# Patient Record
Sex: Male | Born: 1935 | Race: White | Hispanic: No | Smoking: Former smoker
Health system: Southern US, Community
[De-identification: ages and names within clinical notes are randomized; demographics above are authoritative.]

## PROBLEM LIST (undated history)

## (undated) DIAGNOSIS — D6481 Anemia due to antineoplastic chemotherapy: Secondary | ICD-10-CM

## (undated) DIAGNOSIS — N289 Disorder of kidney and ureter, unspecified: Secondary | ICD-10-CM

## (undated) DIAGNOSIS — E782 Mixed hyperlipidemia: Secondary | ICD-10-CM

## (undated) DIAGNOSIS — T451X5A Adverse effect of antineoplastic and immunosuppressive drugs, initial encounter: Principal | ICD-10-CM

## (undated) DIAGNOSIS — C9310 Chronic myelomonocytic leukemia not having achieved remission: Secondary | ICD-10-CM

## (undated) DIAGNOSIS — M1990 Unspecified osteoarthritis, unspecified site: Secondary | ICD-10-CM

## (undated) DIAGNOSIS — C801 Malignant (primary) neoplasm, unspecified: Secondary | ICD-10-CM

## (undated) DIAGNOSIS — G709 Myoneural disorder, unspecified: Secondary | ICD-10-CM

## (undated) DIAGNOSIS — F32A Depression, unspecified: Secondary | ICD-10-CM

## (undated) DIAGNOSIS — H269 Unspecified cataract: Secondary | ICD-10-CM

## (undated) DIAGNOSIS — K219 Gastro-esophageal reflux disease without esophagitis: Secondary | ICD-10-CM

## (undated) DIAGNOSIS — F419 Anxiety disorder, unspecified: Secondary | ICD-10-CM

## (undated) DIAGNOSIS — D649 Anemia, unspecified: Secondary | ICD-10-CM

## (undated) DIAGNOSIS — N183 Chronic kidney disease, stage 3 (moderate): Secondary | ICD-10-CM

## (undated) DIAGNOSIS — I10 Essential (primary) hypertension: Secondary | ICD-10-CM

## (undated) DIAGNOSIS — F329 Major depressive disorder, single episode, unspecified: Secondary | ICD-10-CM

## (undated) DIAGNOSIS — C8330 Diffuse large B-cell lymphoma, unspecified site: Secondary | ICD-10-CM

## (undated) DIAGNOSIS — H9319 Tinnitus, unspecified ear: Secondary | ICD-10-CM

## (undated) HISTORY — PX: CYSTOSCOPY: SUR368

## (undated) HISTORY — DX: Adverse effect of antineoplastic and immunosuppressive drugs, initial encounter: T45.1X5A

## (undated) HISTORY — DX: Major depressive disorder, single episode, unspecified: F32.9

## (undated) HISTORY — DX: Tinnitus, unspecified ear: H93.19

## (undated) HISTORY — DX: Anemia due to antineoplastic chemotherapy: D64.81

## (undated) HISTORY — DX: Anemia, unspecified: D64.9

## (undated) HISTORY — DX: Gastro-esophageal reflux disease without esophagitis: K21.9

## (undated) HISTORY — DX: Unspecified cataract: H26.9

## (undated) HISTORY — DX: Unspecified osteoarthritis, unspecified site: M19.90

## (undated) HISTORY — DX: Chronic myelomonocytic leukemia not having achieved remission: C93.10

## (undated) HISTORY — DX: Myoneural disorder, unspecified: G70.9

## (undated) HISTORY — DX: Depression, unspecified: F32.A

## (undated) HISTORY — DX: Mixed hyperlipidemia: E78.2

## (undated) HISTORY — DX: Malignant (primary) neoplasm, unspecified: C80.1

## (undated) HISTORY — DX: Diffuse large B-cell lymphoma, unspecified site: C83.30

## (undated) HISTORY — DX: Essential (primary) hypertension: I10

## (undated) HISTORY — DX: Anxiety disorder, unspecified: F41.9

## (undated) HISTORY — DX: Chronic kidney disease, stage 3 (moderate): N18.3

## (undated) HISTORY — PX: CATARACT EXTRACTION, BILATERAL: SHX1313

---

## 2001-06-25 HISTORY — PX: TOTAL HIP ARTHROPLASTY: SHX124

## 2004-06-25 HISTORY — PX: OTHER SURGICAL HISTORY: SHX169

## 2009-09-23 HISTORY — PX: TOTAL HIP ARTHROPLASTY: SHX124

## 2009-10-23 HISTORY — PX: COLONOSCOPY: SHX174

## 2009-10-23 LAB — HM COLONOSCOPY: HM Colonoscopy: NORMAL

## 2012-04-25 HISTORY — PX: TRIGGER FINGER RELEASE: SHX641

## 2013-07-03 DIAGNOSIS — C8589 Other specified types of non-Hodgkin lymphoma, extranodal and solid organ sites: Secondary | ICD-10-CM | POA: Diagnosis not present

## 2013-07-03 DIAGNOSIS — R5383 Other fatigue: Secondary | ICD-10-CM | POA: Diagnosis not present

## 2013-07-03 DIAGNOSIS — N183 Chronic kidney disease, stage 3 unspecified: Secondary | ICD-10-CM | POA: Diagnosis not present

## 2013-07-09 DIAGNOSIS — R5381 Other malaise: Secondary | ICD-10-CM | POA: Diagnosis not present

## 2013-07-09 DIAGNOSIS — R5383 Other fatigue: Secondary | ICD-10-CM | POA: Diagnosis not present

## 2013-07-14 DIAGNOSIS — N183 Chronic kidney disease, stage 3 unspecified: Secondary | ICD-10-CM | POA: Diagnosis not present

## 2013-07-14 DIAGNOSIS — I129 Hypertensive chronic kidney disease with stage 1 through stage 4 chronic kidney disease, or unspecified chronic kidney disease: Secondary | ICD-10-CM | POA: Diagnosis not present

## 2013-08-05 DIAGNOSIS — G4700 Insomnia, unspecified: Secondary | ICD-10-CM | POA: Diagnosis not present

## 2013-08-13 DIAGNOSIS — M542 Cervicalgia: Secondary | ICD-10-CM | POA: Diagnosis not present

## 2013-08-13 DIAGNOSIS — M545 Low back pain, unspecified: Secondary | ICD-10-CM | POA: Diagnosis not present

## 2013-08-13 DIAGNOSIS — IMO0002 Reserved for concepts with insufficient information to code with codable children: Secondary | ICD-10-CM | POA: Diagnosis not present

## 2013-08-13 DIAGNOSIS — Z6832 Body mass index (BMI) 32.0-32.9, adult: Secondary | ICD-10-CM | POA: Diagnosis not present

## 2013-08-13 DIAGNOSIS — M25559 Pain in unspecified hip: Secondary | ICD-10-CM | POA: Diagnosis not present

## 2013-08-18 DIAGNOSIS — M5412 Radiculopathy, cervical region: Secondary | ICD-10-CM | POA: Diagnosis not present

## 2013-08-18 DIAGNOSIS — M542 Cervicalgia: Secondary | ICD-10-CM | POA: Diagnosis not present

## 2013-08-21 DIAGNOSIS — M542 Cervicalgia: Secondary | ICD-10-CM | POA: Diagnosis not present

## 2013-08-21 DIAGNOSIS — M5412 Radiculopathy, cervical region: Secondary | ICD-10-CM | POA: Diagnosis not present

## 2013-08-24 DIAGNOSIS — M542 Cervicalgia: Secondary | ICD-10-CM | POA: Diagnosis not present

## 2013-08-24 DIAGNOSIS — M5412 Radiculopathy, cervical region: Secondary | ICD-10-CM | POA: Diagnosis not present

## 2013-08-28 DIAGNOSIS — M5412 Radiculopathy, cervical region: Secondary | ICD-10-CM | POA: Diagnosis not present

## 2013-08-28 DIAGNOSIS — M542 Cervicalgia: Secondary | ICD-10-CM | POA: Diagnosis not present

## 2013-09-01 DIAGNOSIS — M5412 Radiculopathy, cervical region: Secondary | ICD-10-CM | POA: Diagnosis not present

## 2013-09-01 DIAGNOSIS — M542 Cervicalgia: Secondary | ICD-10-CM | POA: Diagnosis not present

## 2013-09-08 DIAGNOSIS — M5412 Radiculopathy, cervical region: Secondary | ICD-10-CM | POA: Diagnosis not present

## 2013-09-08 DIAGNOSIS — M542 Cervicalgia: Secondary | ICD-10-CM | POA: Diagnosis not present

## 2013-09-10 DIAGNOSIS — M5412 Radiculopathy, cervical region: Secondary | ICD-10-CM | POA: Diagnosis not present

## 2013-09-10 DIAGNOSIS — M545 Low back pain, unspecified: Secondary | ICD-10-CM | POA: Diagnosis not present

## 2013-09-10 DIAGNOSIS — Z6830 Body mass index (BMI) 30.0-30.9, adult: Secondary | ICD-10-CM | POA: Diagnosis not present

## 2013-09-10 DIAGNOSIS — M542 Cervicalgia: Secondary | ICD-10-CM | POA: Diagnosis not present

## 2013-09-10 DIAGNOSIS — IMO0002 Reserved for concepts with insufficient information to code with codable children: Secondary | ICD-10-CM | POA: Diagnosis not present

## 2013-09-11 DIAGNOSIS — G609 Hereditary and idiopathic neuropathy, unspecified: Secondary | ICD-10-CM | POA: Diagnosis not present

## 2013-09-11 DIAGNOSIS — R209 Unspecified disturbances of skin sensation: Secondary | ICD-10-CM | POA: Diagnosis not present

## 2013-09-11 DIAGNOSIS — G479 Sleep disorder, unspecified: Secondary | ICD-10-CM | POA: Diagnosis not present

## 2013-09-15 DIAGNOSIS — M5412 Radiculopathy, cervical region: Secondary | ICD-10-CM | POA: Diagnosis not present

## 2013-09-15 DIAGNOSIS — M542 Cervicalgia: Secondary | ICD-10-CM | POA: Diagnosis not present

## 2013-09-18 DIAGNOSIS — M5412 Radiculopathy, cervical region: Secondary | ICD-10-CM | POA: Diagnosis not present

## 2013-09-18 DIAGNOSIS — M542 Cervicalgia: Secondary | ICD-10-CM | POA: Diagnosis not present

## 2013-09-22 DIAGNOSIS — M542 Cervicalgia: Secondary | ICD-10-CM | POA: Diagnosis not present

## 2013-09-22 DIAGNOSIS — M5412 Radiculopathy, cervical region: Secondary | ICD-10-CM | POA: Diagnosis not present

## 2013-09-25 DIAGNOSIS — M5412 Radiculopathy, cervical region: Secondary | ICD-10-CM | POA: Diagnosis not present

## 2013-09-25 DIAGNOSIS — M542 Cervicalgia: Secondary | ICD-10-CM | POA: Diagnosis not present

## 2013-09-29 DIAGNOSIS — M5412 Radiculopathy, cervical region: Secondary | ICD-10-CM | POA: Diagnosis not present

## 2013-09-29 DIAGNOSIS — M542 Cervicalgia: Secondary | ICD-10-CM | POA: Diagnosis not present

## 2013-10-02 DIAGNOSIS — M5412 Radiculopathy, cervical region: Secondary | ICD-10-CM | POA: Diagnosis not present

## 2013-10-02 DIAGNOSIS — M542 Cervicalgia: Secondary | ICD-10-CM | POA: Diagnosis not present

## 2013-10-07 DIAGNOSIS — E785 Hyperlipidemia, unspecified: Secondary | ICD-10-CM | POA: Diagnosis not present

## 2013-10-07 DIAGNOSIS — R319 Hematuria, unspecified: Secondary | ICD-10-CM | POA: Diagnosis not present

## 2013-10-07 DIAGNOSIS — N189 Chronic kidney disease, unspecified: Secondary | ICD-10-CM | POA: Diagnosis not present

## 2013-10-07 DIAGNOSIS — I129 Hypertensive chronic kidney disease with stage 1 through stage 4 chronic kidney disease, or unspecified chronic kidney disease: Secondary | ICD-10-CM | POA: Diagnosis not present

## 2013-10-08 DIAGNOSIS — I129 Hypertensive chronic kidney disease with stage 1 through stage 4 chronic kidney disease, or unspecified chronic kidney disease: Secondary | ICD-10-CM | POA: Diagnosis not present

## 2013-10-08 DIAGNOSIS — N183 Chronic kidney disease, stage 3 unspecified: Secondary | ICD-10-CM | POA: Diagnosis not present

## 2013-11-25 DIAGNOSIS — C8589 Other specified types of non-Hodgkin lymphoma, extranodal and solid organ sites: Secondary | ICD-10-CM | POA: Diagnosis not present

## 2013-12-01 DIAGNOSIS — C8589 Other specified types of non-Hodgkin lymphoma, extranodal and solid organ sites: Secondary | ICD-10-CM | POA: Diagnosis not present

## 2013-12-07 DIAGNOSIS — Z8572 Personal history of non-Hodgkin lymphomas: Secondary | ICD-10-CM | POA: Diagnosis not present

## 2013-12-07 DIAGNOSIS — E669 Obesity, unspecified: Secondary | ICD-10-CM | POA: Diagnosis not present

## 2013-12-07 DIAGNOSIS — Z87891 Personal history of nicotine dependence: Secondary | ICD-10-CM | POA: Diagnosis not present

## 2013-12-07 DIAGNOSIS — M79609 Pain in unspecified limb: Secondary | ICD-10-CM | POA: Diagnosis not present

## 2013-12-07 DIAGNOSIS — E559 Vitamin D deficiency, unspecified: Secondary | ICD-10-CM | POA: Diagnosis not present

## 2013-12-07 DIAGNOSIS — E785 Hyperlipidemia, unspecified: Secondary | ICD-10-CM | POA: Diagnosis not present

## 2013-12-07 DIAGNOSIS — G609 Hereditary and idiopathic neuropathy, unspecified: Secondary | ICD-10-CM | POA: Diagnosis not present

## 2013-12-07 DIAGNOSIS — N400 Enlarged prostate without lower urinary tract symptoms: Secondary | ICD-10-CM | POA: Diagnosis not present

## 2013-12-07 DIAGNOSIS — M62838 Other muscle spasm: Secondary | ICD-10-CM | POA: Diagnosis not present

## 2013-12-07 DIAGNOSIS — G2581 Restless legs syndrome: Secondary | ICD-10-CM | POA: Diagnosis not present

## 2013-12-07 DIAGNOSIS — E119 Type 2 diabetes mellitus without complications: Secondary | ICD-10-CM | POA: Diagnosis not present

## 2013-12-07 DIAGNOSIS — M5030 Other cervical disc degeneration, unspecified cervical region: Secondary | ICD-10-CM | POA: Diagnosis not present

## 2013-12-07 DIAGNOSIS — R079 Chest pain, unspecified: Secondary | ICD-10-CM | POA: Diagnosis not present

## 2013-12-07 DIAGNOSIS — G4700 Insomnia, unspecified: Secondary | ICD-10-CM | POA: Diagnosis not present

## 2013-12-07 DIAGNOSIS — I129 Hypertensive chronic kidney disease with stage 1 through stage 4 chronic kidney disease, or unspecified chronic kidney disease: Secondary | ICD-10-CM | POA: Diagnosis not present

## 2013-12-07 DIAGNOSIS — F411 Generalized anxiety disorder: Secondary | ICD-10-CM | POA: Diagnosis not present

## 2013-12-07 DIAGNOSIS — N183 Chronic kidney disease, stage 3 unspecified: Secondary | ICD-10-CM | POA: Diagnosis not present

## 2013-12-08 DIAGNOSIS — G589 Mononeuropathy, unspecified: Secondary | ICD-10-CM | POA: Diagnosis not present

## 2013-12-08 DIAGNOSIS — I10 Essential (primary) hypertension: Secondary | ICD-10-CM | POA: Diagnosis not present

## 2013-12-08 DIAGNOSIS — R799 Abnormal finding of blood chemistry, unspecified: Secondary | ICD-10-CM | POA: Diagnosis not present

## 2013-12-08 DIAGNOSIS — N189 Chronic kidney disease, unspecified: Secondary | ICD-10-CM | POA: Diagnosis not present

## 2013-12-08 DIAGNOSIS — M62838 Other muscle spasm: Secondary | ICD-10-CM | POA: Diagnosis not present

## 2013-12-11 DIAGNOSIS — L600 Ingrowing nail: Secondary | ICD-10-CM | POA: Diagnosis not present

## 2013-12-16 DIAGNOSIS — M542 Cervicalgia: Secondary | ICD-10-CM | POA: Diagnosis not present

## 2013-12-16 DIAGNOSIS — M545 Low back pain, unspecified: Secondary | ICD-10-CM | POA: Diagnosis not present

## 2013-12-16 DIAGNOSIS — Z6831 Body mass index (BMI) 31.0-31.9, adult: Secondary | ICD-10-CM | POA: Diagnosis not present

## 2013-12-16 DIAGNOSIS — IMO0001 Reserved for inherently not codable concepts without codable children: Secondary | ICD-10-CM | POA: Diagnosis not present

## 2014-01-05 DIAGNOSIS — N400 Enlarged prostate without lower urinary tract symptoms: Secondary | ICD-10-CM | POA: Diagnosis not present

## 2014-01-05 DIAGNOSIS — R42 Dizziness and giddiness: Secondary | ICD-10-CM | POA: Diagnosis not present

## 2014-01-05 DIAGNOSIS — Z23 Encounter for immunization: Secondary | ICD-10-CM | POA: Diagnosis not present

## 2014-01-05 DIAGNOSIS — G609 Hereditary and idiopathic neuropathy, unspecified: Secondary | ICD-10-CM | POA: Diagnosis not present

## 2014-01-05 DIAGNOSIS — Z0000 Encounter for general adult medical examination without abnormal findings: Secondary | ICD-10-CM | POA: Diagnosis not present

## 2014-01-05 DIAGNOSIS — K219 Gastro-esophageal reflux disease without esophagitis: Secondary | ICD-10-CM | POA: Diagnosis not present

## 2014-01-05 DIAGNOSIS — R599 Enlarged lymph nodes, unspecified: Secondary | ICD-10-CM | POA: Diagnosis not present

## 2014-01-05 DIAGNOSIS — G4700 Insomnia, unspecified: Secondary | ICD-10-CM | POA: Diagnosis not present

## 2014-01-05 DIAGNOSIS — H902 Conductive hearing loss, unspecified: Secondary | ICD-10-CM | POA: Diagnosis not present

## 2014-01-12 DIAGNOSIS — E785 Hyperlipidemia, unspecified: Secondary | ICD-10-CM | POA: Diagnosis not present

## 2014-01-13 DIAGNOSIS — G589 Mononeuropathy, unspecified: Secondary | ICD-10-CM | POA: Diagnosis not present

## 2014-01-13 DIAGNOSIS — Z79899 Other long term (current) drug therapy: Secondary | ICD-10-CM | POA: Diagnosis not present

## 2014-01-13 DIAGNOSIS — G479 Sleep disorder, unspecified: Secondary | ICD-10-CM | POA: Diagnosis not present

## 2014-01-13 DIAGNOSIS — G478 Other sleep disorders: Secondary | ICD-10-CM | POA: Diagnosis not present

## 2014-01-13 DIAGNOSIS — R42 Dizziness and giddiness: Secondary | ICD-10-CM | POA: Diagnosis not present

## 2014-01-20 DIAGNOSIS — K3189 Other diseases of stomach and duodenum: Secondary | ICD-10-CM | POA: Diagnosis not present

## 2014-01-20 DIAGNOSIS — K2270 Barrett's esophagus without dysplasia: Secondary | ICD-10-CM | POA: Diagnosis not present

## 2014-01-20 DIAGNOSIS — Z1211 Encounter for screening for malignant neoplasm of colon: Secondary | ICD-10-CM | POA: Diagnosis not present

## 2014-01-20 DIAGNOSIS — Z86010 Personal history of colonic polyps: Secondary | ICD-10-CM | POA: Diagnosis not present

## 2014-01-20 DIAGNOSIS — K219 Gastro-esophageal reflux disease without esophagitis: Secondary | ICD-10-CM | POA: Diagnosis not present

## 2014-01-20 DIAGNOSIS — R1013 Epigastric pain: Secondary | ICD-10-CM | POA: Diagnosis not present

## 2014-01-20 DIAGNOSIS — K228 Other specified diseases of esophagus: Secondary | ICD-10-CM | POA: Diagnosis not present

## 2014-01-20 DIAGNOSIS — R1310 Dysphagia, unspecified: Secondary | ICD-10-CM | POA: Diagnosis not present

## 2014-01-20 DIAGNOSIS — K5730 Diverticulosis of large intestine without perforation or abscess without bleeding: Secondary | ICD-10-CM | POA: Diagnosis not present

## 2014-01-20 DIAGNOSIS — D126 Benign neoplasm of colon, unspecified: Secondary | ICD-10-CM | POA: Diagnosis not present

## 2014-01-20 DIAGNOSIS — K2289 Other specified disease of esophagus: Secondary | ICD-10-CM | POA: Diagnosis not present

## 2014-01-28 DIAGNOSIS — Z6831 Body mass index (BMI) 31.0-31.9, adult: Secondary | ICD-10-CM | POA: Diagnosis not present

## 2014-01-28 DIAGNOSIS — M545 Low back pain, unspecified: Secondary | ICD-10-CM | POA: Diagnosis not present

## 2014-01-28 DIAGNOSIS — M542 Cervicalgia: Secondary | ICD-10-CM | POA: Diagnosis not present

## 2014-01-28 DIAGNOSIS — IMO0001 Reserved for inherently not codable concepts without codable children: Secondary | ICD-10-CM | POA: Diagnosis not present

## 2014-02-03 DIAGNOSIS — H3530 Unspecified macular degeneration: Secondary | ICD-10-CM | POA: Diagnosis not present

## 2014-02-03 DIAGNOSIS — H16229 Keratoconjunctivitis sicca, not specified as Sjogren's, unspecified eye: Secondary | ICD-10-CM | POA: Diagnosis not present

## 2014-02-03 DIAGNOSIS — H26499 Other secondary cataract, unspecified eye: Secondary | ICD-10-CM | POA: Diagnosis not present

## 2014-02-03 DIAGNOSIS — Z961 Presence of intraocular lens: Secondary | ICD-10-CM | POA: Diagnosis not present

## 2014-03-26 DIAGNOSIS — N390 Urinary tract infection, site not specified: Secondary | ICD-10-CM | POA: Diagnosis not present

## 2014-03-26 DIAGNOSIS — E785 Hyperlipidemia, unspecified: Secondary | ICD-10-CM | POA: Diagnosis not present

## 2014-03-26 DIAGNOSIS — N189 Chronic kidney disease, unspecified: Secondary | ICD-10-CM | POA: Diagnosis not present

## 2014-03-26 DIAGNOSIS — I129 Hypertensive chronic kidney disease with stage 1 through stage 4 chronic kidney disease, or unspecified chronic kidney disease: Secondary | ICD-10-CM | POA: Diagnosis not present

## 2014-04-12 DIAGNOSIS — M545 Low back pain: Secondary | ICD-10-CM | POA: Diagnosis not present

## 2014-04-12 DIAGNOSIS — M542 Cervicalgia: Secondary | ICD-10-CM | POA: Diagnosis not present

## 2014-04-12 DIAGNOSIS — M5441 Lumbago with sciatica, right side: Secondary | ICD-10-CM | POA: Diagnosis not present

## 2014-04-12 DIAGNOSIS — Z6831 Body mass index (BMI) 31.0-31.9, adult: Secondary | ICD-10-CM | POA: Diagnosis not present

## 2014-04-23 DIAGNOSIS — M4806 Spinal stenosis, lumbar region: Secondary | ICD-10-CM | POA: Diagnosis not present

## 2014-04-23 DIAGNOSIS — M419 Scoliosis, unspecified: Secondary | ICD-10-CM | POA: Diagnosis not present

## 2014-04-23 DIAGNOSIS — M5441 Lumbago with sciatica, right side: Secondary | ICD-10-CM | POA: Diagnosis not present

## 2014-04-23 DIAGNOSIS — M545 Low back pain: Secondary | ICD-10-CM | POA: Diagnosis not present

## 2014-04-23 DIAGNOSIS — M5136 Other intervertebral disc degeneration, lumbar region: Secondary | ICD-10-CM | POA: Diagnosis not present

## 2014-04-25 HISTORY — PX: UPPER GI ENDOSCOPY: SHX6162

## 2014-04-28 DIAGNOSIS — Z23 Encounter for immunization: Secondary | ICD-10-CM | POA: Diagnosis not present

## 2014-04-30 DIAGNOSIS — Z6830 Body mass index (BMI) 30.0-30.9, adult: Secondary | ICD-10-CM | POA: Diagnosis not present

## 2014-04-30 DIAGNOSIS — M5136 Other intervertebral disc degeneration, lumbar region: Secondary | ICD-10-CM | POA: Diagnosis not present

## 2014-04-30 DIAGNOSIS — M5441 Lumbago with sciatica, right side: Secondary | ICD-10-CM | POA: Diagnosis not present

## 2014-05-04 DIAGNOSIS — M5431 Sciatica, right side: Secondary | ICD-10-CM | POA: Diagnosis not present

## 2014-05-07 DIAGNOSIS — M5431 Sciatica, right side: Secondary | ICD-10-CM | POA: Diagnosis not present

## 2014-05-11 DIAGNOSIS — M5431 Sciatica, right side: Secondary | ICD-10-CM | POA: Diagnosis not present

## 2014-05-18 DIAGNOSIS — M5431 Sciatica, right side: Secondary | ICD-10-CM | POA: Diagnosis not present

## 2014-05-19 DIAGNOSIS — K22710 Barrett's esophagus with low grade dysplasia: Secondary | ICD-10-CM | POA: Diagnosis not present

## 2014-05-19 DIAGNOSIS — K2270 Barrett's esophagus without dysplasia: Secondary | ICD-10-CM | POA: Diagnosis not present

## 2014-05-22 DIAGNOSIS — C8590 Non-Hodgkin lymphoma, unspecified, unspecified site: Secondary | ICD-10-CM | POA: Diagnosis not present

## 2014-05-24 DIAGNOSIS — M5431 Sciatica, right side: Secondary | ICD-10-CM | POA: Diagnosis not present

## 2014-05-26 DIAGNOSIS — G479 Sleep disorder, unspecified: Secondary | ICD-10-CM | POA: Diagnosis not present

## 2014-05-26 DIAGNOSIS — G629 Polyneuropathy, unspecified: Secondary | ICD-10-CM | POA: Diagnosis not present

## 2014-05-26 DIAGNOSIS — Z125 Encounter for screening for malignant neoplasm of prostate: Secondary | ICD-10-CM | POA: Diagnosis not present

## 2014-05-28 DIAGNOSIS — C8580 Other specified types of non-Hodgkin lymphoma, unspecified site: Secondary | ICD-10-CM | POA: Diagnosis not present

## 2014-05-28 DIAGNOSIS — M5431 Sciatica, right side: Secondary | ICD-10-CM | POA: Diagnosis not present

## 2014-05-28 DIAGNOSIS — C8590 Non-Hodgkin lymphoma, unspecified, unspecified site: Secondary | ICD-10-CM | POA: Diagnosis not present

## 2014-06-01 DIAGNOSIS — M5431 Sciatica, right side: Secondary | ICD-10-CM | POA: Diagnosis not present

## 2014-06-03 DIAGNOSIS — C8580 Other specified types of non-Hodgkin lymphoma, unspecified site: Secondary | ICD-10-CM | POA: Diagnosis not present

## 2014-06-09 DIAGNOSIS — G8929 Other chronic pain: Secondary | ICD-10-CM | POA: Diagnosis not present

## 2014-06-09 DIAGNOSIS — G629 Polyneuropathy, unspecified: Secondary | ICD-10-CM | POA: Diagnosis not present

## 2014-06-09 DIAGNOSIS — I10 Essential (primary) hypertension: Secondary | ICD-10-CM | POA: Diagnosis not present

## 2014-06-09 DIAGNOSIS — M549 Dorsalgia, unspecified: Secondary | ICD-10-CM | POA: Diagnosis not present

## 2014-06-09 DIAGNOSIS — G4730 Sleep apnea, unspecified: Secondary | ICD-10-CM | POA: Diagnosis not present

## 2014-06-10 DIAGNOSIS — R350 Frequency of micturition: Secondary | ICD-10-CM | POA: Diagnosis not present

## 2014-06-10 DIAGNOSIS — R3912 Poor urinary stream: Secondary | ICD-10-CM | POA: Diagnosis not present

## 2014-06-10 DIAGNOSIS — N3943 Post-void dribbling: Secondary | ICD-10-CM | POA: Diagnosis not present

## 2014-06-10 DIAGNOSIS — N401 Enlarged prostate with lower urinary tract symptoms: Secondary | ICD-10-CM | POA: Diagnosis not present

## 2014-06-11 DIAGNOSIS — M549 Dorsalgia, unspecified: Secondary | ICD-10-CM | POA: Diagnosis not present

## 2014-06-11 DIAGNOSIS — G629 Polyneuropathy, unspecified: Secondary | ICD-10-CM | POA: Diagnosis not present

## 2014-06-11 DIAGNOSIS — G8929 Other chronic pain: Secondary | ICD-10-CM | POA: Diagnosis not present

## 2014-06-11 DIAGNOSIS — G4730 Sleep apnea, unspecified: Secondary | ICD-10-CM | POA: Diagnosis not present

## 2014-08-11 DIAGNOSIS — H903 Sensorineural hearing loss, bilateral: Secondary | ICD-10-CM | POA: Diagnosis not present

## 2014-08-11 DIAGNOSIS — J329 Chronic sinusitis, unspecified: Secondary | ICD-10-CM | POA: Diagnosis not present

## 2014-08-17 DIAGNOSIS — R7989 Other specified abnormal findings of blood chemistry: Secondary | ICD-10-CM | POA: Diagnosis not present

## 2014-08-17 DIAGNOSIS — R42 Dizziness and giddiness: Secondary | ICD-10-CM | POA: Diagnosis not present

## 2014-10-05 DIAGNOSIS — I129 Hypertensive chronic kidney disease with stage 1 through stage 4 chronic kidney disease, or unspecified chronic kidney disease: Secondary | ICD-10-CM | POA: Diagnosis not present

## 2014-10-05 DIAGNOSIS — N189 Chronic kidney disease, unspecified: Secondary | ICD-10-CM | POA: Diagnosis not present

## 2014-10-05 DIAGNOSIS — N390 Urinary tract infection, site not specified: Secondary | ICD-10-CM | POA: Diagnosis not present

## 2014-10-05 DIAGNOSIS — E785 Hyperlipidemia, unspecified: Secondary | ICD-10-CM | POA: Diagnosis not present

## 2014-10-06 DIAGNOSIS — N2581 Secondary hyperparathyroidism of renal origin: Secondary | ICD-10-CM | POA: Diagnosis not present

## 2014-10-06 DIAGNOSIS — I129 Hypertensive chronic kidney disease with stage 1 through stage 4 chronic kidney disease, or unspecified chronic kidney disease: Secondary | ICD-10-CM | POA: Diagnosis not present

## 2014-10-06 DIAGNOSIS — R319 Hematuria, unspecified: Secondary | ICD-10-CM | POA: Diagnosis not present

## 2014-10-06 DIAGNOSIS — N183 Chronic kidney disease, stage 3 (moderate): Secondary | ICD-10-CM | POA: Diagnosis not present

## 2014-10-21 DIAGNOSIS — J342 Deviated nasal septum: Secondary | ICD-10-CM | POA: Diagnosis not present

## 2014-10-21 DIAGNOSIS — J329 Chronic sinusitis, unspecified: Secondary | ICD-10-CM | POA: Diagnosis not present

## 2014-11-02 DIAGNOSIS — R0981 Nasal congestion: Secondary | ICD-10-CM | POA: Diagnosis not present

## 2014-11-02 DIAGNOSIS — R0982 Postnasal drip: Secondary | ICD-10-CM | POA: Diagnosis not present

## 2014-11-02 DIAGNOSIS — J329 Chronic sinusitis, unspecified: Secondary | ICD-10-CM | POA: Diagnosis not present

## 2014-11-02 DIAGNOSIS — J342 Deviated nasal septum: Secondary | ICD-10-CM | POA: Diagnosis not present

## 2014-11-02 DIAGNOSIS — J3489 Other specified disorders of nose and nasal sinuses: Secondary | ICD-10-CM | POA: Diagnosis not present

## 2014-11-05 DIAGNOSIS — J329 Chronic sinusitis, unspecified: Secondary | ICD-10-CM | POA: Diagnosis not present

## 2014-11-15 DIAGNOSIS — C8338 Diffuse large B-cell lymphoma, lymph nodes of multiple sites: Secondary | ICD-10-CM | POA: Diagnosis not present

## 2014-11-15 DIAGNOSIS — R972 Elevated prostate specific antigen [PSA]: Secondary | ICD-10-CM | POA: Diagnosis not present

## 2014-11-15 DIAGNOSIS — N400 Enlarged prostate without lower urinary tract symptoms: Secondary | ICD-10-CM | POA: Diagnosis not present

## 2014-11-15 DIAGNOSIS — C8589 Other specified types of non-Hodgkin lymphoma, extranodal and solid organ sites: Secondary | ICD-10-CM | POA: Diagnosis not present

## 2014-11-15 LAB — CBC AND DIFFERENTIAL
HCT: 34 % — AB (ref 41–53)
Hemoglobin: 11 g/dL — AB (ref 13.5–17.5)
Neutrophils Absolute: 2 /uL
Platelets: 161 10*3/uL (ref 150–399)
WBC: 5 10*3/mL

## 2014-11-15 LAB — BASIC METABOLIC PANEL
BUN: 23 mg/dL — AB (ref 4–21)
CREATININE: 1.5 mg/dL — AB (ref 0.6–1.3)
GLUCOSE: 90 mg/dL
POTASSIUM: 3.9 mmol/L (ref 3.4–5.3)
Sodium: 141 mmol/L (ref 137–147)

## 2014-11-15 LAB — HEPATIC FUNCTION PANEL
ALT: 29 U/L (ref 10–40)
AST: 21 U/L (ref 14–40)
Alkaline Phosphatase: 70 U/L (ref 25–125)
Bilirubin, Total: 0.7 mg/dL

## 2014-11-29 DIAGNOSIS — H906 Mixed conductive and sensorineural hearing loss, bilateral: Secondary | ICD-10-CM | POA: Diagnosis not present

## 2014-11-29 DIAGNOSIS — Z96642 Presence of left artificial hip joint: Secondary | ICD-10-CM | POA: Diagnosis not present

## 2014-11-29 DIAGNOSIS — Z0000 Encounter for general adult medical examination without abnormal findings: Secondary | ICD-10-CM | POA: Diagnosis not present

## 2014-11-29 DIAGNOSIS — M25552 Pain in left hip: Secondary | ICD-10-CM | POA: Diagnosis not present

## 2014-11-29 DIAGNOSIS — G629 Polyneuropathy, unspecified: Secondary | ICD-10-CM | POA: Diagnosis not present

## 2014-11-29 DIAGNOSIS — G4700 Insomnia, unspecified: Secondary | ICD-10-CM | POA: Diagnosis not present

## 2014-11-29 DIAGNOSIS — N400 Enlarged prostate without lower urinary tract symptoms: Secondary | ICD-10-CM | POA: Diagnosis not present

## 2014-11-29 DIAGNOSIS — N183 Chronic kidney disease, stage 3 (moderate): Secondary | ICD-10-CM | POA: Diagnosis not present

## 2014-11-29 DIAGNOSIS — Z6832 Body mass index (BMI) 32.0-32.9, adult: Secondary | ICD-10-CM | POA: Diagnosis not present

## 2014-12-02 DIAGNOSIS — C8580 Other specified types of non-Hodgkin lymphoma, unspecified site: Secondary | ICD-10-CM | POA: Diagnosis not present

## 2014-12-06 DIAGNOSIS — N401 Enlarged prostate with lower urinary tract symptoms: Secondary | ICD-10-CM | POA: Diagnosis not present

## 2014-12-06 DIAGNOSIS — R972 Elevated prostate specific antigen [PSA]: Secondary | ICD-10-CM | POA: Diagnosis not present

## 2015-02-09 DIAGNOSIS — N138 Other obstructive and reflux uropathy: Secondary | ICD-10-CM | POA: Insufficient documentation

## 2015-02-09 DIAGNOSIS — N401 Enlarged prostate with lower urinary tract symptoms: Secondary | ICD-10-CM | POA: Diagnosis not present

## 2015-02-09 DIAGNOSIS — J329 Chronic sinusitis, unspecified: Secondary | ICD-10-CM | POA: Insufficient documentation

## 2015-02-09 DIAGNOSIS — N183 Chronic kidney disease, stage 3 unspecified: Secondary | ICD-10-CM

## 2015-02-09 DIAGNOSIS — K219 Gastro-esophageal reflux disease without esophagitis: Secondary | ICD-10-CM | POA: Diagnosis not present

## 2015-02-09 DIAGNOSIS — E782 Mixed hyperlipidemia: Secondary | ICD-10-CM | POA: Diagnosis not present

## 2015-02-09 DIAGNOSIS — M545 Low back pain, unspecified: Secondary | ICD-10-CM | POA: Insufficient documentation

## 2015-02-09 DIAGNOSIS — G620 Drug-induced polyneuropathy: Secondary | ICD-10-CM | POA: Diagnosis not present

## 2015-02-09 DIAGNOSIS — I10 Essential (primary) hypertension: Secondary | ICD-10-CM | POA: Diagnosis not present

## 2015-02-09 DIAGNOSIS — H9193 Unspecified hearing loss, bilateral: Secondary | ICD-10-CM | POA: Insufficient documentation

## 2015-02-09 DIAGNOSIS — G4700 Insomnia, unspecified: Secondary | ICD-10-CM | POA: Insufficient documentation

## 2015-02-09 DIAGNOSIS — F411 Generalized anxiety disorder: Secondary | ICD-10-CM | POA: Diagnosis not present

## 2015-02-09 DIAGNOSIS — C8330 Diffuse large B-cell lymphoma, unspecified site: Secondary | ICD-10-CM | POA: Diagnosis not present

## 2015-02-09 DIAGNOSIS — T451X5A Adverse effect of antineoplastic and immunosuppressive drugs, initial encounter: Secondary | ICD-10-CM

## 2015-02-09 DIAGNOSIS — F5101 Primary insomnia: Secondary | ICD-10-CM | POA: Diagnosis not present

## 2015-02-09 HISTORY — DX: Chronic kidney disease, stage 3 unspecified: N18.30

## 2015-02-09 HISTORY — DX: Mixed hyperlipidemia: E78.2

## 2015-02-09 HISTORY — DX: Essential (primary) hypertension: I10

## 2015-02-09 HISTORY — DX: Gastro-esophageal reflux disease without esophagitis: K21.9

## 2015-04-06 DIAGNOSIS — H353111 Nonexudative age-related macular degeneration, right eye, early dry stage: Secondary | ICD-10-CM | POA: Diagnosis not present

## 2015-04-06 DIAGNOSIS — H04123 Dry eye syndrome of bilateral lacrimal glands: Secondary | ICD-10-CM | POA: Diagnosis not present

## 2015-04-06 DIAGNOSIS — H52223 Regular astigmatism, bilateral: Secondary | ICD-10-CM | POA: Diagnosis not present

## 2015-04-06 DIAGNOSIS — H524 Presbyopia: Secondary | ICD-10-CM | POA: Diagnosis not present

## 2015-04-06 DIAGNOSIS — H353131 Nonexudative age-related macular degeneration, bilateral, early dry stage: Secondary | ICD-10-CM | POA: Diagnosis not present

## 2015-04-06 DIAGNOSIS — H353121 Nonexudative age-related macular degeneration, left eye, early dry stage: Secondary | ICD-10-CM | POA: Diagnosis not present

## 2015-04-06 DIAGNOSIS — H5213 Myopia, bilateral: Secondary | ICD-10-CM | POA: Diagnosis not present

## 2015-05-10 ENCOUNTER — Telehealth: Payer: Self-pay

## 2015-05-10 NOTE — Telephone Encounter (Signed)
Pre visit call completed patient to bring in all information. New Patient.

## 2015-05-11 ENCOUNTER — Ambulatory Visit (INDEPENDENT_AMBULATORY_CARE_PROVIDER_SITE_OTHER): Payer: Medicare Other | Admitting: Physician Assistant

## 2015-05-11 ENCOUNTER — Encounter: Payer: Self-pay | Admitting: Physician Assistant

## 2015-05-11 VITALS — BP 115/55 | HR 72 | Temp 98.0°F | Resp 16 | Ht 69.0 in | Wt 223.0 lb

## 2015-05-11 DIAGNOSIS — N183 Chronic kidney disease, stage 3 unspecified: Secondary | ICD-10-CM

## 2015-05-11 DIAGNOSIS — L570 Actinic keratosis: Secondary | ICD-10-CM

## 2015-05-11 DIAGNOSIS — T451X5A Adverse effect of antineoplastic and immunosuppressive drugs, initial encounter: Secondary | ICD-10-CM

## 2015-05-11 DIAGNOSIS — G620 Drug-induced polyneuropathy: Secondary | ICD-10-CM

## 2015-05-11 DIAGNOSIS — N138 Other obstructive and reflux uropathy: Secondary | ICD-10-CM

## 2015-05-11 DIAGNOSIS — N401 Enlarged prostate with lower urinary tract symptoms: Secondary | ICD-10-CM

## 2015-05-11 DIAGNOSIS — C8303 Small cell B-cell lymphoma, intra-abdominal lymph nodes: Secondary | ICD-10-CM

## 2015-05-11 DIAGNOSIS — L989 Disorder of the skin and subcutaneous tissue, unspecified: Secondary | ICD-10-CM

## 2015-05-11 NOTE — Progress Notes (Signed)
Pre visit review using our clinic review tool, if applicable. No additional management support is needed unless otherwise documented below in the visit note/SLS  

## 2015-05-11 NOTE — Progress Notes (Signed)
Patient presents to clinic today to establish care.  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 Is currently on Gabapentin 900 mg three times daily. Endorses symptoms slightly worsened recently and current regimen is not completely therapeutic. Patient without history of diabetes. Is not a smoker.  Patient c/o skin lesion anterior to left eat that has been present for several months and non-healing. Endorses rough area of head that has also been present for several years. Endorses history of pre-cancerous skin lesions.  Chronic Issues: B Cell Lymphoma -- Stage II, status post R-CHOP. Has appointment next week to establish with Dr. Marin Olp.   CKD III -- Followed by Nephrology in [place]. No scheduled follow-up here. Is staying hydrated and watching diet. Endorses good urinary output.  Past Medical History  Diagnosis Date  . Anemia   . Anxiety   . Arthritis   . Cancer ([place])   . Cataract   . Depression   . Neuromuscular disorder ([place])   . Chronic kidney disease (CKD), stage III (moderate) 02/09/2015    Controlled  . Essential (primary) hypertension 02/09/2015  . Gastro-esophageal reflux disease without esophagitis 02/09/2015    Controlled  . Combined fat and carbohydrate induced hyperlipemia 02/09/2015    Controlled  . Tinnitus     Past Surgical History  Procedure Laterality Date  . Turbinectomy  2006  . Cataract extraction, bilateral    . Cystoscopy    . Total hip arthroplasty  2003    Right   . Total hip arthroplasty  April, 2011    Left  . Trigger finger release  Nov 2013  . Upper gi endoscopy  Nov 2015  . Colonoscopy  May 2011    No current outpatient prescriptions on file prior to visit.   No current facility-administered medications on file prior to visit.    Allergies  Allergen Reactions  . Nsaids Other (See Comments) and Tinitus    Bruising   . Ciprofloxacin     Other reaction(s): Other (See  Comments) Achilles tendon  . Statins     Other reaction(s): Other (See Comments) Muscle pain  . Requip [Ropinirole Hcl] Anxiety  . Ropinirole Anxiety    Mood swing    Family History  Problem Relation Age of Onset  . Anuerysm Mother 43    Deceased  . Colon cancer Father 55    Deceased  . Colon cancer Brother   . Prostate cancer Brother     Deceased  . Alzheimer's disease Brother   . Heart disease Brother   . Alzheimer's disease Paternal Grandmother   . Early death Maternal Grandmother     Unknown  . Diabetes Neg Hx   . Obesity Son   . Obesity Son     Gastric Bypass  . Healthy Son   . Healthy Daughter     Social History   Social History  . Marital Status: Widowed    Spouse Name: N/A  . Number of Children: N/A  . Years of Education: N/A   Occupational History  . Not on file.   Social History Main Topics  . Smoking status: Former Research scientist (life sciences)  . Smokeless tobacco: Not on file     Comment: Quit >50 years ago  . Alcohol Use: Not on file  . Drug Use: Not on file  . Sexual Activity: Not on file   Other Topics Concern  . Not on file   Social History Narrative    Review of Systems  Constitutional: Negative for fever and weight loss.  HENT: Negative for ear discharge, ear pain, hearing loss and tinnitus.   Eyes: Negative for blurred vision, double vision, photophobia and pain.  Respiratory: Negative for cough and shortness of breath.   Cardiovascular: Negative for chest pain and palpitations.  Gastrointestinal: Negative for heartburn, nausea, vomiting, abdominal pain, diarrhea, constipation, blood in stool and melena.  Genitourinary: Negative for dysuria, urgency, frequency, hematuria and flank pain.  Musculoskeletal: Negative for falls.  Neurological: Positive for tingling. Negative for dizziness, loss of consciousness and headaches.  Endo/Heme/Allergies: Negative for environmental allergies.  Psychiatric/Behavioral: Negative for depression, suicidal ideas,  hallucinations and substance abuse. The patient is not nervous/anxious and does not have insomnia.     BP 115/55 mmHg  Pulse 72  Temp(Src) 98 F (36.7 C) (Oral)  Resp 16  Ht _0  (1.753 m)  Wt 223 lb (101.152 kg)  BMI 32.92 kg/m2  SpO2 98%  Physical Exam  Constitutional: He is oriented to person, place, and time and well-developed, well-nourished, and in no distress.  HENT:  Head: Normocephalic and atraumatic.  Right Ear: External ear normal.  Left Ear: External ear normal.  Nose: Nose normal.  Mouth/Throat: Oropharynx is clear and moist. No oropharyngeal exudate.  Eyes: Conjunctivae and EOM are normal. Pupils are equal, round, and reactive to light.  Neck: Neck supple. No thyromegaly present.  Cardiovascular: Normal rate, regular rhythm, normal heart sounds and intact distal pulses.   Pulmonary/Chest: Effort normal and breath sounds normal. No respiratory distress. He has no wheezes. He has no rales. He exhibits no tenderness.  Abdominal: Soft. Bowel sounds are normal. He exhibits no distension and no mass. There is no tenderness. There is no rebound and no guarding.  Genitourinary: Testes/scrotum normal and penis normal. No discharge found.  Lymphadenopathy:    He has no cervical adenopathy.  Neurological: He is alert and oriented to person, place, and time.  Skin: Skin is warm and dry. No rash noted.     Psychiatric: Affect normal.  Vitals reviewed.   Recent Results (from the past 2160 hour(s))  CBC with Differential [facility])     Status: Abnormal   Collection Time: 05/13/15 10:50 AM  Result Value Ref Range   WBC 4.2 4.0 - 10.0 10e3/uL   RBC 3.34 (L) 4.20 - 5.70 10e6/uL   HGB 11.2 (L) 13.0 - 17.1 g/dL   HCT 34.1 (L) 38.7 - 49.9 %   MCV 102 (H) 82 - 98 fL   MCH 33.5 (H) 28.0 - 33.4 pg   MCHC 32.8 32.0 - 35.9 g/dL   RDW 13.7 11.1 - 15.7 %   Platelets 136 (L) 145 - 400 10e3/uL   NEUT# 1.1 (L) 1.5 - 6.5 10e3/uL   LYMPH# 1.3 0.9 - 3.3 10e3/uL   MONO# 1.6 (H)  0.1 - 0.9 10e3/uL   Eosinophils Absolute 0.1 0.0 - 0.5 10e3/uL   BASO# 0.1 0.0 - 0.2 10e3/uL   NEUT% 27.2 (L) 40.0 - 80.0 %   LYMPH% 30.4 14.0 - 48.0 %   MONO% 37.6 (H) 0.0 - 13.0 %   EOS% 2.2 0.0 - 7.0 %   BASO% 2.6 (H) 0.0 - 2.0 %  Comprehensive metabolic panel     Status: Abnormal   Collection Time: 05/13/15 10:51 AM  Result Value Ref Range   Sodium 140 136 - 145 mEq/L   Potassium 4.3 3.5 - 5.1 mEq/L   Chloride 105 98 - 109 mEq/L   CO2 30 (H) 22 -  29 mEq/L   Glucose 100 70 - 140 mg/dl    Comment: Glucose reference range is for nonfasting patients. Fasting glucose reference range is 70- 100.   BUN 25.7 7.0 - 26.0 mg/dL   Creatinine 1.3 0.7 - 1.3 mg/dL   Total Bilirubin 0.77 0.20 - 1.20 mg/dL   Alkaline Phosphatase 58 40 - 150 U/L   AST 19 5 - 34 U/L   ALT 14 0 - 55 U/L   Total Protein 6.9 6.4 - 8.3 g/dL   Albumin 3.8 3.5 - 5.0 g/dL   Calcium 9.7 8.4 - 10.4 mg/dL   Anion Gap 5 3 - 11 mEq/L   EGFR 48 (L) >90 ml/min/1.73 m2    Comment: eGFR is calculated using the CKD-EPI Creatinine Equation (2009)  Lactate dehydrogenase     Status: None   Collection Time: 05/13/15 10:51 AM  Result Value Ref Range   LDH 182 125 - 245 U/L    Assessment/Plan: Actinic keratosis Cryotherapy applied x 1.  B-cell lymphoma ([place]) In remission. Followed by Dr. Marin Olp -- will establish care next week. No changes.  Benign prostatic hyperplasia with urinary obstruction Doing well on Cardura without side effects. BP stable. Continue current regimen.  Chronic kidney disease (CKD), stage III (moderate) Patient to establish care with Nephrology in the area. Recent labs reviewed brought in by patient and stable. Continue current regimen.  Peripheral neuropathy due to chemotherapy (H[facility]) Will increase AM and PM dose of Gabapentin to 1200 mg. Continue 900 mg noon dosing. Follow-up with Oncology as scheduled.  Skin lesion of face Concern for BCC. Referral to Dermatology placed.

## 2015-05-11 NOTE — Patient Instructions (Signed)
Please continue medications as directed with the following exceptions: Increase Gabapentin noon and evening doses to 1200 mg each. Continue 900 mg each morning.  Follow up with Dr. Marin Olp as scheduled.  You will be contacted by the Dermatology office for skin examination.

## 2015-05-13 ENCOUNTER — Ambulatory Visit: Payer: Medicare Other

## 2015-05-13 ENCOUNTER — Other Ambulatory Visit (HOSPITAL_BASED_OUTPATIENT_CLINIC_OR_DEPARTMENT_OTHER): Payer: Medicare Other

## 2015-05-13 ENCOUNTER — Ambulatory Visit (HOSPITAL_BASED_OUTPATIENT_CLINIC_OR_DEPARTMENT_OTHER): Payer: Medicare Other | Admitting: Hematology & Oncology

## 2015-05-13 ENCOUNTER — Encounter: Payer: Self-pay | Admitting: Hematology & Oncology

## 2015-05-13 VITALS — BP 139/68 | HR 68 | Temp 97.5°F | Resp 16 | Ht 67.0 in | Wt 222.0 lb

## 2015-05-13 DIAGNOSIS — C8303 Small cell B-cell lymphoma, intra-abdominal lymph nodes: Secondary | ICD-10-CM | POA: Diagnosis not present

## 2015-05-13 DIAGNOSIS — D72821 Monocytosis (symptomatic): Secondary | ICD-10-CM | POA: Diagnosis not present

## 2015-05-13 LAB — COMPREHENSIVE METABOLIC PANEL (CC13)
ALT: 14 U/L (ref 0–55)
AST: 19 U/L (ref 5–34)
Albumin: 3.8 g/dL (ref 3.5–5.0)
Alkaline Phosphatase: 58 U/L (ref 40–150)
Anion Gap: 5 mEq/L (ref 3–11)
BUN: 25.7 mg/dL (ref 7.0–26.0)
CHLORIDE: 105 meq/L (ref 98–109)
CO2: 30 meq/L — AB (ref 22–29)
CREATININE: 1.3 mg/dL (ref 0.7–1.3)
Calcium: 9.7 mg/dL (ref 8.4–10.4)
EGFR: 48 mL/min/{1.73_m2} — ABNORMAL LOW (ref >90)
Glucose: 100 mg/dl (ref 70–140)
Potassium: 4.3 mEq/L (ref 3.5–5.1)
Sodium: 140 mEq/L (ref 136–145)
Total Bilirubin: 0.77 mg/dL (ref 0.20–1.20)
Total Protein: 6.9 g/dL (ref 6.4–8.3)

## 2015-05-13 LAB — CBC WITH DIFFERENTIAL (CANCER CENTER ONLY)
BASO#: 0.1 10*3/uL (ref 0.0–0.2)
BASO%: 2.6 % — AB (ref 0.0–2.0)
EOS%: 2.2 % (ref 0.0–7.0)
Eosinophils Absolute: 0.1 10*3/uL (ref 0.0–0.5)
HCT: 34.1 % — ABNORMAL LOW (ref 38.7–49.9)
HGB: 11.2 g/dL — ABNORMAL LOW (ref 13.0–17.1)
LYMPH#: 1.3 10*3/uL (ref 0.9–3.3)
LYMPH%: 30.4 % (ref 14.0–48.0)
MCH: 33.5 pg — ABNORMAL HIGH (ref 28.0–33.4)
MCHC: 32.8 g/dL (ref 32.0–35.9)
MCV: 102 fL — ABNORMAL HIGH (ref 82–98)
MONO#: 1.6 10*3/uL — ABNORMAL HIGH (ref 0.1–0.9)
MONO%: 37.6 % — ABNORMAL HIGH (ref 0.0–13.0)
NEUT#: 1.1 10*3/uL — ABNORMAL LOW (ref 1.5–6.5)
NEUT%: 27.2 % — AB (ref 40.0–80.0)
PLATELETS: 136 10*3/uL — AB (ref 145–400)
RBC: 3.34 10*6/uL — ABNORMAL LOW (ref 4.20–5.70)
RDW: 13.7 % (ref 11.1–15.7)
WBC: 4.2 10*3/uL (ref 4.0–10.0)

## 2015-05-13 LAB — LACTATE DEHYDROGENASE (CC13): LDH: 182 U/L (ref 125–245)

## 2015-05-13 NOTE — Progress Notes (Signed)
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 OTHER    New Patient  : I had lymphoma up in [place]. I now have moved to [place]. I need to have follow-up.  HPI: Jeremiah Walker is a very nice 81 year old white male. He has been in good health. He worked for [employer]. 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  He's been in good health his whole life. He has had a few surgeries.  Back in October 2013, he noted a right supra-clavicular mass. Ultimately, this was biopsied and found to be a diffuse large cell non-Hodgkin's lymphoma.  He had a bone marrow biopsy done. This was negative.  He had a PET scan done which did not show any extranodal disease.  He was treated with 6 cycles of chemotherapy with R-CHOP. He completed treatment back in April 2014.  He has been in remission. Thereafter he tolerated treatment very well. He said he did not require any Neulasta support.  He has family in [place]. As such, he elected to move to the area so that he can be close to his family.  He feels well. He lives in assisted living. He goes to the gym twice a week.  Staff tightness good. He's had no nausea or vomiting.  He does have neuropathy in his feet. He is on Neurontin for this.  He's had no bleeding. He's had no change in bowel or bladder habits.  He smoked probably 50 years ago. He was in the [employer]. He was in the [employer]. I thanked him for his service to our country.  He does have an occasional beer.  Currently, his performance status is ECOG 1.                 Past Medical History  Diagnosis Date  . Anemia   . Anxiety   . Arthritis   . Cancer ([place])   . Cataract   . Depression   . Neuromuscular disorder ([place])   . Chronic kidney disease (CKD), stage III (moderate) 02/09/2015    Controlled  . Essential (primary) hypertension 02/09/2015  . Gastro-esophageal reflux disease without esophagitis 02/09/2015     Controlled  . Combined fat and carbohydrate induced hyperlipemia 02/09/2015    Controlled  . Tinnitus   :  Past Surgical History  Procedure Laterality Date  . Turbinectomy  2006  . Cataract extraction, bilateral    . Cystoscopy    . Total hip arthroplasty  2003    Right   . Total hip arthroplasty  April, 2011    Left  . Trigger finger release  Nov 2013  . Upper gi endoscopy  Nov 2015  . Colonoscopy  May 2011  :   Current outpatient prescriptions:  .  ALPRAZolam (XANAX) 0.5 MG tablet, TK 1 T PO TID PRF SLEEP OR ANXIETY, Disp: , Rfl: 0 .  amoxicillin (AMOXIL) 500 MG capsule, Take 500 mg by mouth as directed. ONE HOUR PRIOR TO DENTAL PROCEDURES, Disp: , Rfl:  .  bethanechol (URECHOLINE) 25 MG tablet, TK 1 T PO  TID, Disp: , Rfl: 0 .  Cholecalciferol (VITAMIN D3) 2000 UNITS capsule, Take 2,000 Units by mouth daily., Disp: , Rfl:  .  diphenhydrAMINE (BENADRYL) 50 MG capsule, Take 50 mg by mouth at bedtime as needed for sleep., Disp: , Rfl:  .  doxazosin (CARDURA) 2 MG tablet, TK 1 T PO  BID, Disp: ,  Rfl: 1 .  gabapentin (NEURONTIN) 300 MG capsule, TK 3 CS PO TID PLUS 2 CS QD PRN, Disp: , Rfl: 1 .  HYDROcodone-acetaminophen (NORCO) 7.5-325 MG tablet, Take 1 tablet by mouth 3 (three) times daily., Disp: , Rfl: 0 .  niacin 500 MG tablet, Take 500 mg by mouth at bedtime., Disp: , Rfl:  .  Omega-3 Fatty Acids (OMEGA-3 FISH OIL PO), Take 2 each by mouth every morning. EPA/DHA 520/350, Disp: , Rfl:  .  polyethylene glycol (MIRALAX / GLYCOLAX) packet, Take 17 g by mouth daily as needed., Disp: , Rfl:  .  psyllium (METAMUCIL) 58.6 % powder, Take 1 packet by mouth daily., Disp: , Rfl:  .  S-Adenosylmethionine (SAM-E) 400 MG TABS, Take 400 mg by mouth daily., Disp: , Rfl: :  :  Allergies  Allergen Reactions  . Nsaids Other (See Comments) and Tinitus    Bruising   . Ciprofloxacin     Other reaction(s): Other (See Comments) Achilles tendon  . Statins     Other reaction(s): Other (See  Comments) Muscle pain  . Requip [Ropinirole Hcl] Anxiety  . Ropinirole Anxiety    Mood swing  :  Family History  Problem Relation Age of Onset  . Anuerysm Mother 68    Deceased  . Colon cancer Father 71    Deceased  . Colon cancer Brother   . Prostate cancer Brother     Deceased  . Alzheimer's disease Brother   . Heart disease Brother   . Alzheimer's disease Paternal Grandmother   . Early death Maternal Grandmother     Unknown  . Diabetes Neg Hx   . Obesity Son   . Obesity Son     Gastric Bypass  . Healthy Son   . Healthy Daughter   :  Social History   Social History  . Marital Status: Widowed    Spouse Name: N/A  . Number of Children: N/A  . Years of Education: N/A   Occupational History  . Not on file.   Social History Main Topics  . Smoking status: Former Research scientist (life sciences)  . Smokeless tobacco: Not on file     Comment: Quit >50 years ago  . Alcohol Use: Not on file  . Drug Use: Not on file  . Sexual Activity: Not on file   Other Topics Concern  . Not on file   Social History Narrative  :  Pertinent items are noted in HPI.  Exam: _0 @  Elderly but well-nourished white male in no obvious distress. Vital signs show a temperature of 97.5. Pulse 60. Blood pressure 139/68. Weight is 222 pounds. Head and neck exam shows no ocular or oral lesions. He has no palpable cervical or supraclavicular lymph nodes. Lungs are clear. Cardiac exam regular rate and rhythm with no murmurs, rubs or bruits. Abdomen is soft. He has good bowel sounds. There is no fluid wave. There is no palpable liver or spleen tip. Axillary exam shows no bilateral axillary adenopathy. Back exam shows no tenderness over the spine, ribs or hips. Extremities shows no clubbing, cyanosis or edema. He has good range of motion of his joints. He does have some osteoarthritic changes in his joints. Skin exam shows no rashes, ecchymoses or petechia. Neurological exam shows no focal neurological deficits.     Recent Labs  05/13/15 1050  WBC 4.2  HGB 11.2*  HCT 34.1*  PLT 136*   No results for input(s): NA, K, CL, CO2, GLUCOSE, BUN, CREATININE, CALCIUM in the  last 72 hours.  Blood smear review: Normochromic and normocytic population of red blood cells. He has a couple spherocytes. He has no rouleau formation. I see no target cells. He has no nucleated red blood cells. White blood cells showed increase in monocytes. He has a couple hypersegmented polys. I do not see any obvious immature myeloid cells. He has no atypical lymphocytes. Platelets are adequate in number and size.  Pathology: None     Assessment and Plan:  Jeremiah Walker is an 81 year old gentleman. He had a good prognosis large cell lymphoma. His IPI score was 2. This should give him about 80% survival probability.  I am a little bit troubled though by his blood counts. He has the decrease in neutrophils are increased in monocytes. His platelet count is down just a little bit. He is a little bit more anemic.  I think that he is at risk for myelodysplasia. I think the rib biggest risk is just his age. 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 I would not think that his bone marrow would have suffered effects from the chemotherapy that he had.  For now, I think we can just follow this along.  I will like to see him back in 3 months. I think this would be very reasonable for [place] to follow him up. If his blood counts look a little bit worse in 3 months, then I think we will have to do a bone marrow test on him. I spent about 45 minutes with him. He is a very interesting guy. It was fun to talk with him.  Jeremiah E.

## 2015-05-21 ENCOUNTER — Encounter: Payer: Self-pay | Admitting: Physician Assistant

## 2015-05-21 DIAGNOSIS — L570 Actinic keratosis: Secondary | ICD-10-CM | POA: Insufficient documentation

## 2015-05-21 DIAGNOSIS — L989 Disorder of the skin and subcutaneous tissue, unspecified: Secondary | ICD-10-CM | POA: Insufficient documentation

## 2015-05-21 NOTE — Assessment & Plan Note (Signed)
Cryotherapy applied x 1.

## 2015-05-21 NOTE — Assessment & Plan Note (Signed)
Doing well on Cardura without side effects. BP stable. Continue current regimen.

## 2015-05-21 NOTE — Assessment & Plan Note (Signed)
In remission. Followed by Dr. Marin Olp -- will establish care next week. No changes.

## 2015-05-21 NOTE — Assessment & Plan Note (Addendum)
Concern for BCC. Referral to Dermatology placed.

## 2015-05-21 NOTE — Assessment & Plan Note (Signed)
Patient to establish care with Nephrology in the area. Recent labs reviewed brought in by patient and stable. Continue current regimen.

## 2015-05-21 NOTE — Assessment & Plan Note (Signed)
Will increase AM and PM dose of Gabapentin to 1200 mg. Continue 900 mg noon dosing. Follow-up with Oncology as scheduled.

## 2015-05-24 ENCOUNTER — Encounter: Payer: Self-pay | Admitting: Physician Assistant

## 2015-05-31 ENCOUNTER — Ambulatory Visit (INDEPENDENT_AMBULATORY_CARE_PROVIDER_SITE_OTHER): Payer: Medicare Other | Admitting: Podiatry

## 2015-05-31 ENCOUNTER — Encounter: Payer: Self-pay | Admitting: Podiatry

## 2015-05-31 ENCOUNTER — Ambulatory Visit (INDEPENDENT_AMBULATORY_CARE_PROVIDER_SITE_OTHER): Payer: Medicare Other

## 2015-05-31 VITALS — BP 144/76 | HR 73 | Resp 16 | Ht 69.0 in | Wt 218.0 lb

## 2015-05-31 DIAGNOSIS — M2041 Other hammer toe(s) (acquired), right foot: Secondary | ICD-10-CM

## 2015-05-31 DIAGNOSIS — M779 Enthesopathy, unspecified: Secondary | ICD-10-CM

## 2015-05-31 DIAGNOSIS — L603 Nail dystrophy: Secondary | ICD-10-CM | POA: Diagnosis not present

## 2015-05-31 DIAGNOSIS — M7751 Other enthesopathy of right foot: Secondary | ICD-10-CM

## 2015-05-31 DIAGNOSIS — M778 Other enthesopathies, not elsewhere classified: Secondary | ICD-10-CM

## 2015-05-31 NOTE — Progress Notes (Signed)
   Subjective:    Patient ID: Jeremiah Walker, male    DOB: Apr 10, 1928, 81 y.o.   MRN: [id number]  HPI: He presents today as a 81 year old white male with a recent history of cramping and drawing of his toes #2 #3 #4 of the right foot. He states that it seems to be centered at the second medical as he points to the area and grasps the toe. He also states that his right foot great toenail has peeled away. He has a history of hypertension and cancer which she is taking chemotherapy. He states that he has more unilateral neuropathy left. He states that this is associated with his chemotherapy per his oncologist.    Review of Systems  All other systems reviewed and are negative.      Objective:   Physical Exam: 81 year old white male very pleasant in no apparent distress presents strong palpable pulses bilateral neurologic sensorium slightly diminished per Semmes-Weinstein monofilament bilateral deep tendon reflexes are intact bilateral and muscle strength +5 over 5 flexors plantar flexors and inverters and everters all intrinsic musculature is intact. Orthopedic evaluation demonstrates all joints distal to the ankle for range of motion without crepitation. He has pain on range of motion and palpation of the second metatarsophalangeal joint of the right foot. This appears to be hypertrophic with spurring though radiographically it does not appear to be as bad as it actually feels on physical exam. Mild flexible hammertoe deformities are noted 2 through 4 of the right foot. Cutaneous evaluation demonstrates supple well-hydrated cutis thick incurvated nail margins to the tibial and fibular border of the hallux left but of no concern to him. Right foot does demonstrate a nail dystrophy hallux right with a portion of the nail removed. The nail is still thick and mildly tender.        Assessment & Plan:  Assessment: Capsulitis second metatarsophalangeal joint right with osteoarthritic changes. I feel  that this is more than likely associated with his cramping of his toes as he is compensating for this joint. He also has a nail dystrophy hallux right.  Plan: Debridement of the nail today. Injection of the joint today after sterile Betadine skin prep with dexamethasone and local anesthetic. We discussed appropriate shoe gear and I will follow-up with him in 6 weeks.

## 2015-06-07 DIAGNOSIS — D1801 Hemangioma of skin and subcutaneous tissue: Secondary | ICD-10-CM | POA: Diagnosis not present

## 2015-06-07 DIAGNOSIS — L578 Other skin changes due to chronic exposure to nonionizing radiation: Secondary | ICD-10-CM | POA: Diagnosis not present

## 2015-06-07 DIAGNOSIS — D485 Neoplasm of uncertain behavior of skin: Secondary | ICD-10-CM | POA: Diagnosis not present

## 2015-06-07 DIAGNOSIS — L570 Actinic keratosis: Secondary | ICD-10-CM | POA: Diagnosis not present

## 2015-06-07 DIAGNOSIS — L821 Other seborrheic keratosis: Secondary | ICD-10-CM | POA: Diagnosis not present

## 2015-06-07 DIAGNOSIS — C44311 Basal cell carcinoma of skin of nose: Secondary | ICD-10-CM | POA: Diagnosis not present

## 2015-06-28 ENCOUNTER — Ambulatory Visit (INDEPENDENT_AMBULATORY_CARE_PROVIDER_SITE_OTHER): Payer: Medicare Other | Admitting: Podiatry

## 2015-06-28 ENCOUNTER — Encounter: Payer: Self-pay | Admitting: Podiatry

## 2015-06-28 VITALS — BP 120/56 | HR 75 | Resp 16

## 2015-06-28 DIAGNOSIS — B351 Tinea unguium: Secondary | ICD-10-CM

## 2015-06-28 DIAGNOSIS — M778 Other enthesopathies, not elsewhere classified: Secondary | ICD-10-CM

## 2015-06-28 DIAGNOSIS — M779 Enthesopathy, unspecified: Secondary | ICD-10-CM

## 2015-06-28 DIAGNOSIS — M7751 Other enthesopathy of right foot: Secondary | ICD-10-CM | POA: Diagnosis not present

## 2015-06-28 DIAGNOSIS — M79609 Pain in unspecified limb: Principal | ICD-10-CM

## 2015-06-28 NOTE — Progress Notes (Signed)
Subjective:     Patient ID: Jeremiah Walker, male   DOB: 12/06/1927, 82 y.o.   MRN: [id number]  HPI this 82 year old patient returns to the office follow-up for diagnosis of a capsulitis of the second MPJ. He was treated with injection therapy by Dr. Milinda Pointer for weeks ago. He states that he is doing much better. It is 80% better than the previous visit. He is pleased with his progress. He relates having pain and discomfort in both big toes on both feet. He states that the nails are thick disfigured and ingrowing into the corners on both feet. He desires an evaluation and treatment of the nails at this visit   Review of Systems     Objective:   Physical Exam GENERAL APPEARANCE: Alert, conversant. Appropriately groomed. No acute distress.  VASCULAR: Pedal pulses palpable at  [facility] and PT bilateral.  Capillary refill time is immediate to all digits,  Normal temperature gradient.  Digital hair growth is present bilateral  NEUROLOGIC: sensation is diminished  to 5.07 monofilament at 5/5 sites bilateral.  Light touch is intact bilateral, Muscle strength normal.  MUSCULOSKELETAL: acceptable muscle strength, tone and stability bilateral.  Intrinsic muscluature intact bilateral.  Rectus appearance of foot and digits noted bilateral.   DERMATOLOGIC: skin color, texture, and turgor are within normal limits.  No preulcerative lesions or ulcers  are seen, no interdigital maceration noted.  No open lesions present.   No drainage noted.  NAILS  Thick disfigured discolored nails both hallux.  Marked incurvation nail grooves.      Assessment:     Capsulitis right foot   Onychomycosis B/L     Plan:  ROV  No further treatment for his capsulitis right foot.  Debridement and grinding of long nails.  RTC 12 weeks for nail care.    Gardiner Barefoot DPM

## 2015-07-29 ENCOUNTER — Telehealth: Payer: Self-pay | Admitting: Physician Assistant

## 2015-07-29 NOTE — Telephone Encounter (Signed)
Needs OV to discuss these medications.

## 2015-07-29 NOTE — Telephone Encounter (Signed)
Can be reached: [phone]  Reason for call: Pt lives at [facility] [place]. He uses hydrocodone and alprazolam. He said the nurse at [facility] writes it however he has to wait days or weeks to track her down. She isn't routinely available. He is wondering if [facility] Pheasant would be willing to write the scripts for him each month so that he doesn't have to worry about running out.

## 2015-07-29 NOTE — Telephone Encounter (Signed)
We have not prescribed the medication.  Please advise.//AB/CMA

## 2015-08-01 NOTE — Telephone Encounter (Signed)
Called and spoke with the pt and informed him of the note below that he will need an appt to discuss medications before Einar Pheasant can refill them.  Pt verbalized understanding and agreed.  Pt was scheduled an appt for (Friday-08/05/15 @ 3:30pm).//AB/CMA

## 2015-08-01 NOTE — Telephone Encounter (Signed)
Called and [facility] @ 8:54am @ [phone]) asking the pt to RTC regarding med refill request.//AB/CMA

## 2015-08-05 ENCOUNTER — Ambulatory Visit (INDEPENDENT_AMBULATORY_CARE_PROVIDER_SITE_OTHER): Payer: Medicare Other | Admitting: Physician Assistant

## 2015-08-05 ENCOUNTER — Encounter: Payer: Self-pay | Admitting: Physician Assistant

## 2015-08-05 VITALS — BP 146/55 | HR 65 | Temp 97.7°F | Ht 69.0 in | Wt 228.6 lb

## 2015-08-05 DIAGNOSIS — D649 Anemia, unspecified: Secondary | ICD-10-CM

## 2015-08-05 DIAGNOSIS — G620 Drug-induced polyneuropathy: Secondary | ICD-10-CM

## 2015-08-05 DIAGNOSIS — F411 Generalized anxiety disorder: Secondary | ICD-10-CM

## 2015-08-05 DIAGNOSIS — G894 Chronic pain syndrome: Secondary | ICD-10-CM | POA: Diagnosis not present

## 2015-08-05 DIAGNOSIS — T451X5A Adverse effect of antineoplastic and immunosuppressive drugs, initial encounter: Secondary | ICD-10-CM | POA: Diagnosis not present

## 2015-08-05 MED ORDER — ALPRAZOLAM 0.5 MG PO TABS: 0.5000 mg | ORAL_TABLET | Freq: Two times a day (BID) | ORAL | Status: DC | PRN

## 2015-08-05 MED ORDER — HYDROCODONE-ACETAMINOPHEN 7.5-325 MG PO TABS: 1.0000 | ORAL_TABLET | Freq: Three times a day (TID) | ORAL | Status: DC

## 2015-08-05 NOTE — Patient Instructions (Signed)
Please continue medications as directed. Apply a heating pad to the lower back for 10 minutes a couple of times per day. GO easy on yourself at the gym. I will set up a repeat assessment at [facility] for physical therapy.  Follow-up with Dr. Marin Olp as scheduled.

## 2015-08-05 NOTE — Progress Notes (Signed)
Pre visit review using our clinic review tool, if applicable. No additional management support is needed unless otherwise documented below in the visit note. 

## 2015-08-08 NOTE — Assessment & Plan Note (Signed)
Continue current regimen. Will take over medications. CSC signed. Will obtain UDS at follow-up.

## 2015-08-08 NOTE — Assessment & Plan Note (Signed)
Continue Gabapentin as directed. Will take over Norco Rx. Rx granted. CSC signed. Will obtain UDS at follow-up.

## 2015-08-08 NOTE — Progress Notes (Signed)
Patient presents to clinic today for medication management. Patient is requesting our office take over some of his medications from the clinic at his assisted living facility. Patient is currently on Xanax 0.5 mg BID for significant anxiety. Has been on this regimen for > 10 years. Is helpful with acute anxiety and with resting at night. Patient also takes Trazodone for anxiety. Endorses doing very well on this regimen for quite some time.   Patient is also on a combination of gabapentin and Norco for his significant chemotherapy-induced peripheral neuropathy. Pain affects function and sleep. Patent takes Norco 1-2 x per day.  Past Medical History  Diagnosis Date  . Anemia   . Anxiety   . Arthritis   . Cancer ([place])   . Cataract   . Depression   . Neuromuscular disorder ([place])   . Chronic kidney disease (CKD), stage III (moderate) 02/09/2015    Controlled  . Essential (primary) hypertension 02/09/2015  . Gastro-esophageal reflux disease without esophagitis 02/09/2015    Controlled  . Combined fat and carbohydrate induced hyperlipemia 02/09/2015    Controlled  . Tinnitus     Current Outpatient Prescriptions on File Prior to Visit  Medication Sig Dispense Refill  . amoxicillin (AMOXIL) 500 MG capsule Take 500 mg by mouth as directed. ONE HOUR PRIOR TO DENTAL PROCEDURES    . bethanechol (URECHOLINE) 25 MG tablet TK 1 T PO  TID  0  . Cholecalciferol (VITAMIN D3) 2000 UNITS capsule Take 2,000 Units by mouth daily.    Marland Kitchen doxazosin (CARDURA) 2 MG tablet TK 1 T PO  BID  1  . gabapentin (NEURONTIN) 300 MG capsule TK 3 CS PO TID PLUS 2 CS QD PRN  1  . niacin 500 MG tablet Take 500 mg by mouth at bedtime.    . Omega-3 Fatty Acids (OMEGA-3 FISH OIL PO) Take 2 each by mouth every morning. EPA/DHA 520/350    . polyethylene glycol (MIRALAX / GLYCOLAX) packet Take 17 g by mouth daily as needed.    . psyllium (METAMUCIL) 58.6 % powder Take 1 packet by mouth daily.    . S-Adenosylmethionine (SAM-E)  400 MG TABS Take 400 mg by mouth daily.     No current facility-administered medications on file prior to visit.    Allergies  Allergen Reactions  . Nsaids Other (See Comments) and Tinitus    Bruising   . Ciprofloxacin     Other reaction(s): Other (See Comments) Achilles tendon  . Statins     Other reaction(s): Other (See Comments) Muscle pain  . Requip [Ropinirole Hcl] Anxiety  . Ropinirole Anxiety    Mood swing    Family History  Problem Relation Age of Onset  . Anuerysm Mother 59    Deceased  . Colon cancer Father 30    Deceased  . Colon cancer Brother   . Prostate cancer Brother     Deceased  . Alzheimer's disease Brother   . Heart disease Brother   . Alzheimer's disease Paternal Grandmother   . Early death Maternal Grandmother     Unknown  . Diabetes Neg Hx   . Obesity Son   . Obesity Son     Gastric Bypass  . Healthy Son   . Healthy Daughter     Social History   Social History  . Marital Status: Widowed    Spouse Name: N/A  . Number of Children: N/A  . Years of Education: N/A   Social History Main Topics  .  Smoking status: Former Research scientist (life sciences)  . Smokeless tobacco: None     Comment: Quit >50 years ago  . Alcohol Use: None  . Drug Use: None  . Sexual Activity: Not Asked   Other Topics Concern  . None   Social History Narrative    Review of Systems - See HPI.  All other ROS are negative.  BP 146/55 mmHg  Pulse 65  Temp(Src) 97.7 F (36.5 C) (Oral)  Ht '5\' 9"'  (1.753 m)  Wt 228 lb 9.6 oz (103.692 kg)  BMI 33.74 kg/m2  SpO2 97%  Physical Exam  Constitutional: He is oriented to person, place, and time and well-developed, well-nourished, and in no distress.  HENT:  Head: Normocephalic and atraumatic.  Eyes: Conjunctivae are normal.  Cardiovascular: Normal rate, regular rhythm, normal heart sounds and intact distal pulses.   Pulmonary/Chest: Effort normal and breath sounds normal. No respiratory distress. He has no wheezes. He has no rales. He  exhibits no tenderness.  Neurological: He is alert and oriented to person, place, and time.  Skin: Skin is warm and dry. No rash noted.  Psychiatric: Affect normal.  Vitals reviewed.   Recent Results (from the past 2160 hour(s))  CBC with Differential [facility])     Status: Abnormal   Collection Time: 05/13/15 10:50 AM  Result Value Ref Range   WBC 4.2 4.0 - 10.0 10e3/uL   RBC 3.34 (L) 4.20 - 5.70 10e6/uL   HGB 11.2 (L) 13.0 - 17.1 g/dL   HCT 34.1 (L) 38.7 - 49.9 %   MCV 102 (H) 82 - 98 fL   MCH 33.5 (H) 28.0 - 33.4 pg   MCHC 32.8 32.0 - 35.9 g/dL   RDW 13.7 11.1 - 15.7 %   Platelets 136 (L) 145 - 400 10e3/uL   NEUT# 1.1 (L) 1.5 - 6.5 10e3/uL   LYMPH# 1.3 0.9 - 3.3 10e3/uL   MONO# 1.6 (H) 0.1 - 0.9 10e3/uL   Eosinophils Absolute 0.1 0.0 - 0.5 10e3/uL   BASO# 0.1 0.0 - 0.2 10e3/uL   NEUT% 27.2 (L) 40.0 - 80.0 %   LYMPH% 30.4 14.0 - 48.0 %   MONO% 37.6 (H) 0.0 - 13.0 %   EOS% 2.2 0.0 - 7.0 %   BASO% 2.6 (H) 0.0 - 2.0 %  Comprehensive metabolic panel     Status: Abnormal   Collection Time: 05/13/15 10:51 AM  Result Value Ref Range   Sodium 140 136 - 145 mEq/L   Potassium 4.3 3.5 - 5.1 mEq/L   Chloride 105 98 - 109 mEq/L   CO2 30 (H) 22 - 29 mEq/L   Glucose 100 70 - 140 mg/dl    Comment: Glucose reference range is for nonfasting patients. Fasting glucose reference range is 70- 100.   BUN 25.7 7.0 - 26.0 mg/dL   Creatinine 1.3 0.7 - 1.3 mg/dL   Total Bilirubin 0.77 0.20 - 1.20 mg/dL   Alkaline Phosphatase 58 40 - 150 U/L   AST 19 5 - 34 U/L   ALT 14 0 - 55 U/L   Total Protein 6.9 6.4 - 8.3 g/dL   Albumin 3.8 3.5 - 5.0 g/dL   Calcium 9.7 8.4 - 10.4 mg/dL   Anion Gap 5 3 - 11 mEq/L   EGFR 48 (L) >90 ml/min/1.73 m2    Comment: eGFR is calculated using the CKD-EPI Creatinine Equation (2009)  Lactate dehydrogenase     Status: None   Collection Time: 05/13/15 10:51 AM  Result Value Ref Range  LDH 182 125 - 245 U/L    Assessment/Plan: Peripheral neuropathy due to  chemotherapy (HCC) Continue Gabapentin as directed. Will take over Norco Rx. Rx granted. CSC signed. Will obtain UDS at follow-up.  Anxiety, generalized Continue current regimen. Will take over medications. CSC signed. Will obtain UDS at follow-up.

## 2015-08-12 ENCOUNTER — Telehealth: Payer: Self-pay | Admitting: Physician Assistant

## 2015-08-12 MED ORDER — BETHANECHOL CHLORIDE 25 MG PO TABS: ORAL_TABLET | ORAL | Status: DC

## 2015-08-12 NOTE — Telephone Encounter (Signed)
FU with pt informed that Rx is at pharmacy for pick up

## 2015-08-12 NOTE — Telephone Encounter (Signed)
Caller name: [name]   Relationship to patient: Son   Can be reached: [phone]  Pharmacy: [id number] - [place], [place] - [address]  Reason for call: Pt need a refill on his Bethanechol Rx.

## 2015-08-12 NOTE — Telephone Encounter (Signed)
Rx sent 

## 2015-08-15 ENCOUNTER — Ambulatory Visit (HOSPITAL_BASED_OUTPATIENT_CLINIC_OR_DEPARTMENT_OTHER): Payer: Medicare Other | Admitting: Hematology & Oncology

## 2015-08-15 ENCOUNTER — Other Ambulatory Visit (HOSPITAL_BASED_OUTPATIENT_CLINIC_OR_DEPARTMENT_OTHER): Payer: Medicare Other

## 2015-08-15 ENCOUNTER — Encounter: Payer: Self-pay | Admitting: Hematology & Oncology

## 2015-08-15 VITALS — BP 115/58 | HR 80 | Temp 97.8°F | Resp 20 | Wt 223.0 lb

## 2015-08-15 DIAGNOSIS — D649 Anemia, unspecified: Secondary | ICD-10-CM

## 2015-08-15 DIAGNOSIS — D6481 Anemia due to antineoplastic chemotherapy: Secondary | ICD-10-CM

## 2015-08-15 DIAGNOSIS — C8303 Small cell B-cell lymphoma, intra-abdominal lymph nodes: Secondary | ICD-10-CM

## 2015-08-15 DIAGNOSIS — C8304 Small cell B-cell lymphoma, lymph nodes of axilla and upper limb: Secondary | ICD-10-CM

## 2015-08-15 DIAGNOSIS — C8330 Diffuse large B-cell lymphoma, unspecified site: Secondary | ICD-10-CM

## 2015-08-15 DIAGNOSIS — Z8572 Personal history of non-Hodgkin lymphomas: Secondary | ICD-10-CM

## 2015-08-15 DIAGNOSIS — D72821 Monocytosis (symptomatic): Secondary | ICD-10-CM

## 2015-08-15 DIAGNOSIS — T451X5A Adverse effect of antineoplastic and immunosuppressive drugs, initial encounter: Secondary | ICD-10-CM

## 2015-08-15 HISTORY — DX: Adverse effect of antineoplastic and immunosuppressive drugs, initial encounter: D64.81

## 2015-08-15 HISTORY — DX: Diffuse large B-cell lymphoma, unspecified site: C83.30

## 2015-08-15 HISTORY — DX: Adverse effect of antineoplastic and immunosuppressive drugs, initial encounter: T45.1X5A

## 2015-08-15 LAB — CBC WITH DIFFERENTIAL (CANCER CENTER ONLY)
BASO#: 0.1 10*3/uL (ref 0.0–0.2)
BASO%: 1.6 % (ref 0.0–2.0)
EOS ABS: 0.1 10*3/uL (ref 0.0–0.5)
EOS%: 1.4 % (ref 0.0–7.0)
HCT: 32.2 % — ABNORMAL LOW (ref 38.7–49.9)
HEMOGLOBIN: 10.7 g/dL — AB (ref 13.0–17.1)
LYMPH#: 1.9 10*3/uL (ref 0.9–3.3)
LYMPH%: 21.9 % (ref 14.0–48.0)
MCH: 33.5 pg — AB (ref 28.0–33.4)
MCHC: 33.2 g/dL (ref 32.0–35.9)
MCV: 101 fL — AB (ref 82–98)
MONO#: 2.9 10*3/uL — AB (ref 0.1–0.9)
MONO%: 32.9 % — AB (ref 0.0–13.0)
NEUT%: 42.2 % (ref 40.0–80.0)
NEUTROS ABS: 3.7 10*3/uL (ref 1.5–6.5)
Platelets: 220 10*3/uL (ref 145–400)
RBC: 3.19 10*6/uL — AB (ref 4.20–5.70)
RDW: 13.4 % (ref 11.1–15.7)
WBC: 8.7 10*3/uL (ref 4.0–10.0)

## 2015-08-15 LAB — CHCC SATELLITE - SMEAR

## 2015-08-15 LAB — TECHNOLOGIST REVIEW CHCC SATELLITE

## 2015-08-15 NOTE — Progress Notes (Signed)
Hematology and Oncology Follow Up Visit  Jeremiah Walker [id number] Mar 17, 1928 82 y.o. 08/15/2015   Principle Diagnosis:   History of diffuse large cell non-Hodgkin's lymphoma-treated in [place]  Macrocytic anemia  Current Therapy:    Observation     Interim History:  Jeremiah Walker is back for follow-up. This is his second office visit. We first saw him back in November. That point in time, he did have a little bit of anemia. His LDH was normal. I did not do any iron studies on him. His electrolytes all looked okay. He had no renal insufficiency.  He feels well. He is at an assisted living facility. He is exercising. He is getting ready to up to [place] this summer in June for about 10 days. Apparently, a daughter and 2 granddaughters are flying over from [place] to go be with him.  He's had a decent appetite. He's had no nausea or vomiting. He's had no weight loss. He's had no bleeding. He's had no change in bowel or bladder habits. He's had no rashes. He's had no change in medications.  Overall, his performance status is ECOG 1.  Medications:  Current outpatient prescriptions:  .  ALPRAZolam (XANAX) 0.5 MG tablet, Take 1 tablet (0.5 mg total) by mouth 2 (two) times daily as needed for anxiety. (Patient taking differently: Take 0.5 mg by mouth 3 (three) times daily. ), Disp: 60 tablet, Rfl: 0 .  amoxicillin (AMOXIL) 500 MG capsule, Take 500 mg by mouth as directed. ONE HOUR PRIOR TO DENTAL PROCEDURES, Disp: , Rfl:  .  bethanechol (URECHOLINE) 25 MG tablet, TK 1 T PO  TID, Disp: 90 tablet, Rfl: 1 .  Cholecalciferol (VITAMIN D3) 2000 UNITS capsule, Take 2,000 Units by mouth daily., Disp: , Rfl:  .  doxazosin (CARDURA) 2 MG tablet, TK 1 T PO  BID, Disp: , Rfl: 1 .  gabapentin (NEURONTIN) 300 MG capsule, TK 3 CS PO TID PLUS 2 CS QD PRN, Disp: , Rfl: 1 .  HYDROcodone-acetaminophen (NORCO) 7.5-325 MG tablet, Take 1 tablet by mouth 3 (three) times daily., Disp: 90 tablet, Rfl: 0 .   niacin 500 MG tablet, Take 500 mg by mouth at bedtime., Disp: , Rfl:  .  Omega-3 Fatty Acids (OMEGA-3 FISH OIL PO), Take 2 each by mouth every morning. EPA/DHA 520/350, Disp: , Rfl:  .  polyethylene glycol (MIRALAX / GLYCOLAX) packet, Take 17 g by mouth daily as needed., Disp: , Rfl:  .  psyllium (METAMUCIL) 58.6 % powder, Take 1 packet by mouth daily., Disp: , Rfl:  .  S-Adenosylmethionine (SAM-E) 400 MG TABS, Take 400 mg by mouth daily., Disp: , Rfl:  .  traZODone (DESYREL) 50 MG tablet, TAKE 1 TABLET BY MOUTH EVERY PM., Disp: , Rfl:   Allergies:  Allergies  Allergen Reactions  . Nsaids Other (See Comments) and Tinitus    Bruising   . Ciprofloxacin     Other reaction(s): Other (See Comments) Achilles tendon  . Statins     Other reaction(s): Other (See Comments) Muscle pain  . Requip [Ropinirole Hcl] Anxiety  . Ropinirole Anxiety    Mood swing    Past Medical History, Surgical history, Social history, and Family History were reviewed and updated.  Review of Systems: As above  Physical Exam:  weight is 223 lb (101.152 kg). His oral temperature is 97.8 F (36.6 C). His blood pressure is 115/58 and his pulse is 80. His respiration is 20.   Wt Readings from Last 3 Encounters:  08/15/15 223 lb (101.152 kg)  08/05/15 228 lb 9.6 oz (103.692 kg)  05/31/15 218 lb (98.884 kg)     Head and neck exam shows no ocular or oral lesions. He has no palpable cervical or supraclavicular lymph nodes. Lungs are clear. Cardiac exam regular rate and rhythm with no murmurs, rubs or bruits. Abdomen is soft. He has good bowel sounds. There is no fluid wave. There is no palpable liver or spleen tip. Axillary exam shows no bilateral axillary adenopathy. Back exam shows no tenderness over the spine, ribs or hips. Extremities shows no clubbing, cyanosis or edema. He has good range of motion of his joints. He does have some osteoarthritic changes in his joints. Skin exam shows no rashes, ecchymoses or  petechia. Neurological exam shows no focal neurological deficits.  Lab Results  Component Value Date   WBC 8.7 08/15/2015   HGB 10.7* 08/15/2015   HCT 32.2* 08/15/2015   MCV 101* 08/15/2015   PLT 220 08/15/2015     Chemistry      Component Value Date/Time   NA 140 05/13/2015 1051   NA 141 11/15/2014   K 4.3 05/13/2015 1051   K 3.9 11/15/2014   CO2 30* 05/13/2015 1051   BUN 25.7 05/13/2015 1051   BUN 23* 11/15/2014   CREATININE 1.3 05/13/2015 1051   CREATININE 1.5* 11/15/2014   GLU 90 11/15/2014      Component Value Date/Time   CALCIUM 9.7 05/13/2015 1051   ALKPHOS 58 05/13/2015 1051   ALKPHOS 70 11/15/2014   AST 19 05/13/2015 1051   AST 21 11/15/2014   ALT 14 05/13/2015 1051   ALT 29 11/15/2014   BILITOT 0.77 05/13/2015 1051         Impression and Plan: Jeremiah Walker is a 82 year old white male. He had a history of diffuse large cell non-Hodgkin's lymphoma. History of with 6 cycles of R-CHOP. He got this treatment up in [place]. He completed this back in April 2014. Peripheral says blood smear. He does have a couple immature myeloid cells. I do not see any blasts.  I just wonder if he does not have some myelodysplasia. It would be a little bit early to see myelodysplasia from chemotherapy from the lymphoma. It is possible he just may have a second problem given his age.  I am sending of iron studies. I am sending off an erythropoietin level. We'll see what his reticulocyte count is.  It is possible he may need to have a bone marrow biopsy. He still is in very good shape. We have to make sure that we consider his quality of life above all other issues.  I told him for about a half hour. I reviewed the lab work with him.  I would like to see him back in 6 weeks. If his blood count is no better, then we may have to consider a bone marrow test. He would be agreeable to this.     [name], MD 2/20/20173:24 PM

## 2015-08-16 LAB — LACTATE DEHYDROGENASE: LDH: 233 U/L (ref 125–245)

## 2015-08-16 LAB — IRON AND TIBC
%SAT: 17 % — AB (ref 20–55)
Iron: 33 ug/dL — ABNORMAL LOW (ref 42–163)
TIBC: 190 ug/dL — ABNORMAL LOW (ref 202–409)
UIBC: 157 ug/dL (ref 117–376)

## 2015-08-16 LAB — FERRITIN: FERRITIN: 456 ng/mL — AB (ref 22–316)

## 2015-08-16 LAB — RETICULOCYTES: Reticulocyte Count: 0.9 % (ref 0.6–2.6)

## 2015-08-16 LAB — ERYTHROPOIETIN: ERYTHROPOIETIN: 19.3 m[IU]/mL — AB (ref 2.6–18.5)

## 2015-08-17 DIAGNOSIS — I10 Essential (primary) hypertension: Secondary | ICD-10-CM | POA: Diagnosis not present

## 2015-08-17 DIAGNOSIS — C8330 Diffuse large B-cell lymphoma, unspecified site: Secondary | ICD-10-CM | POA: Diagnosis not present

## 2015-08-30 DIAGNOSIS — C44311 Basal cell carcinoma of skin of nose: Secondary | ICD-10-CM | POA: Diagnosis not present

## 2015-09-13 ENCOUNTER — Encounter: Payer: Self-pay | Admitting: Physician Assistant

## 2015-09-13 ENCOUNTER — Ambulatory Visit (INDEPENDENT_AMBULATORY_CARE_PROVIDER_SITE_OTHER): Payer: Medicare Other | Admitting: Physician Assistant

## 2015-09-13 VITALS — BP 124/62 | HR 73 | Temp 97.8°F | Ht 69.0 in | Wt 221.6 lb

## 2015-09-13 DIAGNOSIS — M654 Radial styloid tenosynovitis [de Quervain]: Secondary | ICD-10-CM | POA: Diagnosis not present

## 2015-09-13 MED ORDER — GABAPENTIN 300 MG PO CAPS: ORAL_CAPSULE | ORAL | Status: DC

## 2015-09-13 MED ORDER — DOXAZOSIN MESYLATE 2 MG PO TABS: ORAL_TABLET | ORAL | Status: DC

## 2015-09-13 NOTE — Progress Notes (Signed)
Pre visit review using our clinic review tool, if applicable. No additional management support is needed unless otherwise documented below in the visit note. 

## 2015-09-13 NOTE — Progress Notes (Signed)
Patient presents to clinic today c/o pain in R posterior thumb, traveling from wrist x 2-3 weeks. Denies trauma/injury, swelling, redness, bruising, numbness or weakness. Endorses history of Harriet Pho in the same hand many years prior. States this feels similar.  Past Medical History  Diagnosis Date  . Anemia   . Anxiety   . Arthritis   . Cancer ([place])   . Cataract   . Depression   . Neuromuscular disorder ([place])   . Chronic kidney disease (CKD), stage III (moderate) 02/09/2015    Controlled  . Essential (primary) hypertension 02/09/2015  . Gastro-esophageal reflux disease without esophagitis 02/09/2015    Controlled  . Combined fat and carbohydrate induced hyperlipemia 02/09/2015    Controlled  . Tinnitus   . Anemia due to antineoplastic chemotherapy 08/15/2015  . DLBCL (diffuse large B cell lymphoma) ([place]) 08/15/2015    Current Outpatient Prescriptions on File Prior to Visit  Medication Sig Dispense Refill  . ALPRAZolam (XANAX) 0.5 MG tablet Take 1 tablet (0.5 mg total) by mouth 2 (two) times daily as needed for anxiety. (Patient taking differently: Take 0.5 mg by mouth 3 (three) times daily. ) 60 tablet 0  . amoxicillin (AMOXIL) 500 MG capsule Take 500 mg by mouth as directed. ONE HOUR PRIOR TO DENTAL PROCEDURES    . bethanechol (URECHOLINE) 25 MG tablet TK 1 T PO  TID 90 tablet 1  . Cholecalciferol (VITAMIN D3) 2000 UNITS capsule Take 2,000 Units by mouth daily.    Marland Kitchen HYDROcodone-acetaminophen (NORCO) 7.5-325 MG tablet Take 1 tablet by mouth 3 (three) times daily. 90 tablet 0  . niacin 500 MG tablet Take 500 mg by mouth at bedtime.    . Omega-3 Fatty Acids (OMEGA-3 FISH OIL PO) Take 2 each by mouth every morning. EPA/DHA 520/350    . polyethylene glycol (MIRALAX / GLYCOLAX) packet Take 17 g by mouth daily as needed.    . psyllium (METAMUCIL) 58.6 % powder Take 1 packet by mouth daily.    . S-Adenosylmethionine (SAM-E) 400 MG TABS Take 400 mg by mouth daily.     No current  facility-administered medications on file prior to visit.    Allergies  Allergen Reactions  . Nsaids Other (See Comments) and Tinitus    Bruising   . Ciprofloxacin     Other reaction(s): Other (See Comments) Achilles tendon  . Statins     Other reaction(s): Other (See Comments) Muscle pain  . Requip [Ropinirole Hcl] Anxiety  . Ropinirole Anxiety    Mood swing    Family History  Problem Relation Age of Onset  . Anuerysm Mother 3    Deceased  . Colon cancer Father 17    Deceased  . Colon cancer Brother   . Prostate cancer Brother     Deceased  . Alzheimer's disease Brother   . Heart disease Brother   . Alzheimer's disease Paternal Grandmother   . Early death Maternal Grandmother     Unknown  . Diabetes Neg Hx   . Obesity Son   . Obesity Son     Gastric Bypass  . Healthy Son   . Healthy Daughter     Social History   Social History  . Marital Status: Widowed    Spouse Name: N/A  . Number of Children: N/A  . Years of Education: N/A   Social History Main Topics  . Smoking status: Former Research scientist (life sciences)  . Smokeless tobacco: None     Comment: Quit >50 years ago  .  Alcohol Use: None  . Drug Use: None  . Sexual Activity: Not Asked   Other Topics Concern  . None   Social History Narrative    Review of Systems - See HPI.  All other ROS are negative.  BP 124/62 mmHg  Pulse 73  Temp(Src) 97.8 F (36.6 C) (Oral)  Ht 5\' 9"  (1.753 m)  Wt 221 lb 9.6 oz (100.517 kg)  BMI 32.71 kg/m2  SpO2 97%  Physical Exam  Constitutional: He is oriented to person, place, and time and well-developed, well-nourished, and in no distress.  HENT:  Head: Normocephalic and atraumatic.  Cardiovascular: Normal rate, regular rhythm, normal heart sounds and intact distal pulses.   Pulmonary/Chest: Effort normal. No respiratory distress. He has no wheezes. He has no rales. He exhibits no tenderness.  Musculoskeletal:       Right hand: He exhibits normal range of motion. Normal sensation  noted. Normal strength noted.  + Finkelstein testing of R thumb.  Neurological: He is alert and oriented to person, place, and time.  Skin: Skin is warm and dry. No rash noted.  Psychiatric: Affect normal.  Vitals reviewed.   Recent Results (from the past 2160 hour(s))  CBC with Differential [facility])     Status: Abnormal   Collection Time: 08/15/15  1:59 PM  Result Value Ref Range   WBC 8.7 4.0 - 10.0 10e3/uL   RBC 3.19 (L) 4.20 - 5.70 10e6/uL   HGB 10.7 (L) 13.0 - 17.1 g/dL   HCT 32.2 (L) 38.7 - 49.9 %   MCV 101 (H) 82 - 98 fL   MCH 33.5 (H) 28.0 - 33.4 pg   MCHC 33.2 32.0 - 35.9 g/dL   RDW 13.4 11.1 - 15.7 %   Platelets 220 145 - 400 10e3/uL   NEUT# 3.7 1.5 - 6.5 10e3/uL   LYMPH# 1.9 0.9 - 3.3 10e3/uL   MONO# 2.9 (H) 0.1 - 0.9 10e3/uL   Eosinophils Absolute 0.1 0.0 - 0.5 10e3/uL   BASO# 0.1 0.0 - 0.2 10e3/uL   NEUT% 42.2 40.0 - 80.0 %   LYMPH% 21.9 14.0 - 48.0 %   MONO% 32.9 (H) 0.0 - 13.0 %   EOS% 1.4 0.0 - 7.0 %   BASO% 1.6 0.0 - 2.0 %  [facility] - Smear     Status: None   Collection Time: 08/15/15  1:59 PM  Result Value Ref Range   Smear Result Smear Available   TECHNOLOGIST REVIEW [facility]     Status: None   Collection Time: 08/15/15  1:59 PM  Result Value Ref Range   Tech Review Metas and Myelocytes present   Erythropoietin     Status: Abnormal   Collection Time: 08/15/15  2:00 PM  Result Value Ref Range   Erythropoietin 19.3 (H) 2.6 - 18.5 mIU/mL  Reticulocytes     Status: None   Collection Time: 08/15/15  2:00 PM  Result Value Ref Range   Reticulocyte Count 0.9 0.6 - 2.6 %  Ferritin     Status: Abnormal   Collection Time: 08/15/15  2:00 PM  Result Value Ref Range   Ferritin 456 (H) 22 - 316 ng/ml  Iron and TIBC     Status: Abnormal   Collection Time: 08/15/15  2:00 PM  Result Value Ref Range   Iron 33 (L) 42 - 163 ug/dL   TIBC 190 (L) 202 - 409 ug/dL   UIBC 157 117 - 376 ug/dL   %SAT 17 (L) 20 -  55 %  Lactate dehydrogenase      Status: None   Collection Time: 08/15/15  2:01 PM  Result Value Ref Range   LDH 233 125 - 245 U/L    Assessment/Plan: De Quervain's tenosynovitis, right Thumb spica splint applied. Patient cannot tolerate oral NSAIDs. Topical Aspercreme recommended. Continue chronic Hydrocodone. Supportive measures reviewed. Follow-up scheduled.

## 2015-09-13 NOTE — Patient Instructions (Signed)
Please wear splint during the night and throughout day when possible. Continue hydrocodone as directed. Once pain improving, switch to tylenol. Apply topical Aspercreme to the area. Avoid heavy lifting.  Follow-up 2 weeks.

## 2015-09-13 NOTE — Assessment & Plan Note (Signed)
Thumb spica splint applied. Patient cannot tolerate oral NSAIDs. Topical Aspercreme recommended. Continue chronic Hydrocodone. Supportive measures reviewed. Follow-up scheduled.

## 2015-09-26 ENCOUNTER — Ambulatory Visit (INDEPENDENT_AMBULATORY_CARE_PROVIDER_SITE_OTHER): Payer: Medicare Other | Admitting: Physician Assistant

## 2015-09-26 ENCOUNTER — Encounter: Payer: Self-pay | Admitting: Hematology & Oncology

## 2015-09-26 ENCOUNTER — Ambulatory Visit (HOSPITAL_BASED_OUTPATIENT_CLINIC_OR_DEPARTMENT_OTHER): Payer: Medicare Other | Admitting: Hematology & Oncology

## 2015-09-26 ENCOUNTER — Encounter: Payer: Self-pay | Admitting: Physician Assistant

## 2015-09-26 ENCOUNTER — Other Ambulatory Visit (HOSPITAL_BASED_OUTPATIENT_CLINIC_OR_DEPARTMENT_OTHER): Payer: Medicare Other

## 2015-09-26 VITALS — BP 148/60 | HR 71 | Temp 97.8°F | Ht 69.0 in | Wt 220.4 lb

## 2015-09-26 VITALS — BP 149/87 | HR 75 | Temp 98.2°F | Resp 18 | Ht 69.0 in | Wt 220.0 lb

## 2015-09-26 DIAGNOSIS — D649 Anemia, unspecified: Secondary | ICD-10-CM | POA: Diagnosis not present

## 2015-09-26 DIAGNOSIS — D6481 Anemia due to antineoplastic chemotherapy: Secondary | ICD-10-CM | POA: Diagnosis not present

## 2015-09-26 DIAGNOSIS — C8330 Diffuse large B-cell lymphoma, unspecified site: Secondary | ICD-10-CM

## 2015-09-26 DIAGNOSIS — I10 Essential (primary) hypertension: Secondary | ICD-10-CM | POA: Diagnosis not present

## 2015-09-26 DIAGNOSIS — M654 Radial styloid tenosynovitis [de Quervain]: Secondary | ICD-10-CM

## 2015-09-26 DIAGNOSIS — G894 Chronic pain syndrome: Secondary | ICD-10-CM

## 2015-09-26 DIAGNOSIS — T451X5A Adverse effect of antineoplastic and immunosuppressive drugs, initial encounter: Secondary | ICD-10-CM

## 2015-09-26 DIAGNOSIS — Z8572 Personal history of non-Hodgkin lymphomas: Secondary | ICD-10-CM | POA: Diagnosis not present

## 2015-09-26 LAB — CBC WITH DIFFERENTIAL (CANCER CENTER ONLY)
BASO#: 0.1 10*3/uL (ref 0.0–0.2)
BASO%: 2.2 % — ABNORMAL HIGH (ref 0.0–2.0)
EOS%: 1.3 % (ref 0.0–7.0)
Eosinophils Absolute: 0.1 10*3/uL (ref 0.0–0.5)
HCT: 33.9 % — ABNORMAL LOW (ref 38.7–49.9)
HGB: 11.3 g/dL — ABNORMAL LOW (ref 13.0–17.1)
LYMPH#: 1.4 10*3/uL (ref 0.9–3.3)
LYMPH%: 30 % (ref 14.0–48.0)
MCH: 34 pg — ABNORMAL HIGH (ref 28.0–33.4)
MCHC: 33.3 g/dL (ref 32.0–35.9)
MCV: 102 fL — ABNORMAL HIGH (ref 82–98)
MONO#: 1.3 10*3/uL — ABNORMAL HIGH (ref 0.1–0.9)
MONO%: 28.4 % — AB (ref 0.0–13.0)
NEUT#: 1.7 10*3/uL (ref 1.5–6.5)
NEUT%: 38.1 % — AB (ref 40.0–80.0)
Platelets: 141 10*3/uL — ABNORMAL LOW (ref 145–400)
RBC: 3.32 10*6/uL — ABNORMAL LOW (ref 4.20–5.70)
RDW: 13.9 % (ref 11.1–15.7)
WBC: 4.6 10*3/uL (ref 4.0–10.0)

## 2015-09-26 LAB — CHCC SATELLITE - SMEAR

## 2015-09-26 MED ORDER — ALPRAZOLAM 0.5 MG PO TABS: 0.5000 mg | ORAL_TABLET | Freq: Two times a day (BID) | ORAL | Status: DC | PRN

## 2015-09-26 MED ORDER — HYDROCODONE-ACETAMINOPHEN 7.5-325 MG PO TABS: 1.0000 | ORAL_TABLET | Freq: Three times a day (TID) | ORAL | Status: DC

## 2015-09-26 NOTE — Progress Notes (Signed)
Patient presents to clinic today for 2-week follow-up of De Quervain Tenosynovitis of R hand. Patient endorses wearing thumb spica splint as directed.  Is applying topical Aspercreme to the area. Notes improvement in symptoms Denies worsening pain, residual stiffness of hand. Endorses grip strength improved.   Patient noted to have elevated BP at visit with Dr. Marin Olp earlier today and at current visit. Patient denies chest pain, palpitations, lightheadedness, dizziness, vision changes or frequent headaches. Does endorses quite a bit of soup in his diet.  Patient requesting referral to PT at [facility] for his leg length discrepancy and chronic hop/lower back pain. Is working out 3 times a week but PT recommended he be referred for further assessment..   Past Medical History  Diagnosis Date  . Anemia   . Anxiety   . Arthritis   . Cancer ([place])   . Cataract   . Depression   . Neuromuscular disorder ([place])   . Chronic kidney disease (CKD), stage III (moderate) 02/09/2015    Controlled  . Essential (primary) hypertension 02/09/2015  . Gastro-esophageal reflux disease without esophagitis 02/09/2015    Controlled  . Combined fat and carbohydrate induced hyperlipemia 02/09/2015    Controlled  . Tinnitus   . Anemia due to antineoplastic chemotherapy 08/15/2015  . DLBCL (diffuse large B cell lymphoma) ([place]) 08/15/2015    Current Outpatient Prescriptions on File Prior to Visit  Medication Sig Dispense Refill  . ALPRAZolam (XANAX) 0.5 MG tablet Take 1 tablet (0.5 mg total) by mouth 2 (two) times daily as needed for anxiety. (Patient taking differently: Take 0.5 mg by mouth 3 (three) times daily. ) 60 tablet 0  . amoxicillin (AMOXIL) 500 MG capsule Take 500 mg by mouth as directed. ONE HOUR PRIOR TO DENTAL PROCEDURES    . bethanechol (URECHOLINE) 25 MG tablet TK 1 T PO  TID 90 tablet 1  . Cholecalciferol (VITAMIN D3) 2000 UNITS capsule Take 2,000 Units by mouth daily.    Marland Kitchen doxazosin (CARDURA) 2  MG tablet TK 1 T PO  BID 180 tablet 1  . gabapentin (NEURONTIN) 300 MG capsule TK 3 CS PO TID PLUS 2 CS QD PRN 540 capsule 1  . HYDROcodone-acetaminophen (NORCO) 7.5-325 MG tablet Take 1 tablet by mouth 3 (three) times daily. 90 tablet 0  . niacin 500 MG tablet Take 500 mg by mouth at bedtime.    . Omega-3 Fatty Acids (OMEGA-3 FISH OIL PO) Take 2 each by mouth every morning. EPA/DHA 520/350    . polyethylene glycol (MIRALAX / GLYCOLAX) packet Take 17 g by mouth daily as needed.    . psyllium (METAMUCIL) 58.6 % powder Take 1 packet by mouth daily.    . S-Adenosylmethionine (SAM-E) 400 MG TABS Take 400 mg by mouth daily.     No current facility-administered medications on file prior to visit.    Allergies  Allergen Reactions  . Nsaids Other (See Comments) and Tinitus    Bruising   . Ciprofloxacin     Other reaction(s): Other (See Comments) Achilles tendon  . Statins     Other reaction(s): Other (See Comments) Muscle pain  . Requip [Ropinirole Hcl] Anxiety  . Ropinirole Anxiety    Mood swing    Family History  Problem Relation Age of Onset  . Anuerysm Mother 40    Deceased  . Colon cancer Father 48    Deceased  . Colon cancer Brother   . Prostate cancer Brother     Deceased  . Alzheimer's  disease Brother   . Heart disease Brother   . Alzheimer's disease Paternal Grandmother   . Early death Maternal Grandmother     Unknown  . Diabetes Neg Hx   . Obesity Son   . Obesity Son     Gastric Bypass  . Healthy Son   . Healthy Daughter     Social History   Social History  . Marital Status: Widowed    Spouse Name: N/A  . Number of Children: N/A  . Years of Education: N/A   Social History Main Topics  . Smoking status: Former Research scientist (life sciences)  . Smokeless tobacco: None     Comment: Quit >50 years ago  . Alcohol Use: None  . Drug Use: None  . Sexual Activity: Not Asked   Other Topics Concern  . None   Social History Narrative    Review of Systems - See HPI.  All other  ROS are negative.  BP 148/60 mmHg  Pulse 71  Temp(Src) 97.8 F (36.6 C) (Oral)  Ht 5\' 9"  (1.753 m)  Wt 220 lb 6.4 oz (99.973 kg)  BMI 32.53 kg/m2  SpO2 98%  Physical Exam  Constitutional: He is oriented to person, place, and time and well-developed, well-nourished, and in no distress.  HENT:  Head: Normocephalic and atraumatic.  Eyes: Conjunctivae are normal.  Cardiovascular: Normal rate.   Pulmonary/Chest: Effort normal.  Musculoskeletal:       Right hand: He exhibits normal range of motion and normal capillary refill. Normal sensation noted. Normal strength noted.  Finkelstein test performed with only mild pain, much improved from last examination  Neurological: He is alert and oriented to person, place, and time.  Skin: Skin is warm and dry. No rash noted.  Psychiatric: Affect normal.  Vitals reviewed.   Recent Results (from the past 2160 hour(s))  CBC with Differential [facility])     Status: Abnormal   Collection Time: 08/15/15  1:59 PM  Result Value Ref Range   WBC 8.7 4.0 - 10.0 10e3/uL   RBC 3.19 (L) 4.20 - 5.70 10e6/uL   HGB 10.7 (L) 13.0 - 17.1 g/dL   HCT 32.2 (L) 38.7 - 49.9 %   MCV 101 (H) 82 - 98 fL   MCH 33.5 (H) 28.0 - 33.4 pg   MCHC 33.2 32.0 - 35.9 g/dL   RDW 13.4 11.1 - 15.7 %   Platelets 220 145 - 400 10e3/uL   NEUT# 3.7 1.5 - 6.5 10e3/uL   LYMPH# 1.9 0.9 - 3.3 10e3/uL   MONO# 2.9 (H) 0.1 - 0.9 10e3/uL   Eosinophils Absolute 0.1 0.0 - 0.5 10e3/uL   BASO# 0.1 0.0 - 0.2 10e3/uL   NEUT% 42.2 40.0 - 80.0 %   LYMPH% 21.9 14.0 - 48.0 %   MONO% 32.9 (H) 0.0 - 13.0 %   EOS% 1.4 0.0 - 7.0 %   BASO% 1.6 0.0 - 2.0 %  [facility] - Smear     Status: None   Collection Time: 08/15/15  1:59 PM  Result Value Ref Range   Smear Result Smear Available   TECHNOLOGIST REVIEW [facility]     Status: None   Collection Time: 08/15/15  1:59 PM  Result Value Ref Range   Tech Review Metas and Myelocytes present   Erythropoietin     Status: Abnormal    Collection Time: 08/15/15  2:00 PM  Result Value Ref Range   Erythropoietin 19.3 (H) 2.6 - 18.5 mIU/mL  Reticulocytes  Status: None   Collection Time: 08/15/15  2:00 PM  Result Value Ref Range   Reticulocyte Count 0.9 0.6 - 2.6 %  Ferritin     Status: Abnormal   Collection Time: 08/15/15  2:00 PM  Result Value Ref Range   Ferritin 456 (H) 22 - 316 ng/ml  Iron and TIBC     Status: Abnormal   Collection Time: 08/15/15  2:00 PM  Result Value Ref Range   Iron 33 (L) 42 - 163 ug/dL   TIBC 190 (L) 202 - 409 ug/dL   UIBC 157 117 - 376 ug/dL   %SAT 17 (L) 20 - 55 %  Lactate dehydrogenase     Status: None   Collection Time: 08/15/15  2:01 PM  Result Value Ref Range   LDH 233 125 - 245 U/L  CBC with Differential [facility])     Status: Abnormal   Collection Time: 09/26/15  1:13 PM  Result Value Ref Range   WBC 4.6 4.0 - 10.0 10e3/uL   RBC 3.32 (L) 4.20 - 5.70 10e6/uL   HGB 11.3 (L) 13.0 - 17.1 g/dL   HCT 33.9 (L) 38.7 - 49.9 %   MCV 102 (H) 82 - 98 fL   MCH 34.0 (H) 28.0 - 33.4 pg   MCHC 33.3 32.0 - 35.9 g/dL   RDW 13.9 11.1 - 15.7 %   Platelets 141 (L) 145 - 400 10e3/uL   NEUT# 1.7 1.5 - 6.5 10e3/uL   LYMPH# 1.4 0.9 - 3.3 10e3/uL   MONO# 1.3 (H) 0.1 - 0.9 10e3/uL   Eosinophils Absolute 0.1 0.0 - 0.5 10e3/uL   BASO# 0.1 0.0 - 0.2 10e3/uL   NEUT% 38.1 (L) 40.0 - 80.0 %   LYMPH% 30.0 14.0 - 48.0 %   MONO% 28.4 (H) 0.0 - 13.0 %   EOS% 1.3 0.0 - 7.0 %   BASO% 2.2 (H) 0.0 - 2.0 %  [facility] - Smear     Status: None   Collection Time: 09/26/15  1:13 PM  Result Value Ref Range   Smear Result Smear Available     Assessment/Plan: Essential (primary) hypertension Continue Cardura. Diet is a big contributor. Will begin DASH diet. Follow-up 4 weeks.  Chronic pain syndrome Referral to PT placed for assessment and strengthening.   De Quervain's tenosynovitis, right Much Improved. Will continue Thumb spica splint and topical NSAIDs over the next 1-2 weeks.

## 2015-09-26 NOTE — Assessment & Plan Note (Signed)
Much Improved. Will continue Thumb spica splint and topical NSAIDs over the next 1-2 weeks.

## 2015-09-26 NOTE — Assessment & Plan Note (Signed)
Referral to PT placed for assessment and strengthening.

## 2015-09-26 NOTE — Progress Notes (Signed)
Hematology and Oncology Follow Up Visit  Jeremiah Walker [id number] [birth year] 82 y.o. 09/26/2015   Principle Diagnosis:   History of diffuse large cell non-Hodgkin's lymphoma-treated in [place]  Macrocytic anemia  Current Therapy:    Observation     Interim History:  Jeremiah Walker is back for follow-up. He looks a little better. He is wearing a brace on his right forearm. He apparently stressed a tendon with his right thumb.  We last saw him, I was worried about his hemoglobin dropping. I checked his iron studies. His ferritin was 456. His iron saturation was 17%. His total serum iron was only 33. His LDH was 233. His erythropoietin level was only 19.  He is feeling a little better. He still feels fatigued.  He's had no bleeding. His appetite has been do pretty well. He is going to physical therapy.  He's had no rashes. He's had no weight loss or weight gain.  He's not noted any change in bowel or bladder habits.   Overall, his performance status is ECOG 1.  Medications:  Current outpatient prescriptions:  .  ALPRAZolam (XANAX) 0.5 MG tablet, Take 1 tablet (0.5 mg total) by mouth 2 (two) times daily as needed for anxiety. (Patient taking differently: Take 0.5 mg by mouth 3 (three) times daily. ), Disp: 60 tablet, Rfl: 0 .  amoxicillin (AMOXIL) 500 MG capsule, Take 500 mg by mouth as directed. ONE HOUR PRIOR TO DENTAL PROCEDURES, Disp: , Rfl:  .  bethanechol (URECHOLINE) 25 MG tablet, TK 1 T PO  TID, Disp: 90 tablet, Rfl: 1 .  Cholecalciferol (VITAMIN D3) 2000 UNITS capsule, Take 2,000 Units by mouth daily., Disp: , Rfl:  .  doxazosin (CARDURA) 2 MG tablet, TK 1 T PO  BID, Disp: 180 tablet, Rfl: 1 .  gabapentin (NEURONTIN) 300 MG capsule, TK 3 CS PO TID PLUS 2 CS QD PRN, Disp: 540 capsule, Rfl: 1 .  HYDROcodone-acetaminophen (NORCO) 7.5-325 MG tablet, Take 1 tablet by mouth 3 (three) times daily., Disp: 90 tablet, Rfl: 0 .  niacin 500 MG tablet, Take 500 mg by mouth at  bedtime., Disp: , Rfl:  .  Omega-3 Fatty Acids (OMEGA-3 FISH OIL PO), Take 2 each by mouth every morning. EPA/DHA 520/350, Disp: , Rfl:  .  polyethylene glycol (MIRALAX / GLYCOLAX) packet, Take 17 g by mouth daily as needed., Disp: , Rfl:  .  psyllium (METAMUCIL) 58.6 % powder, Take 1 packet by mouth daily., Disp: , Rfl:  .  S-Adenosylmethionine (SAM-E) 400 MG TABS, Take 400 mg by mouth daily., Disp: , Rfl:   Allergies:  Allergies  Allergen Reactions  . Nsaids Other (See Comments) and Tinitus    Bruising   . Ciprofloxacin     Other reaction(s): Other (See Comments) Achilles tendon  . Statins     Other reaction(s): Other (See Comments) Muscle pain  . Requip [Ropinirole Hcl] Anxiety  . Ropinirole Anxiety    Mood swing    Past Medical History, Surgical history, Social history, and Family History were reviewed and updated.  Review of Systems: As above  Physical Exam:  height is 5\' 9"  (1.753 m) and weight is 220 lb (99.791 kg). His oral temperature is 98.2 F (36.8 C). His blood pressure is 149/87 and his pulse is 75. His respiration is 18.   Wt Readings from Last 3 Encounters:  09/26/15 220 lb 6.4 oz (99.973 kg)  09/26/15 220 lb (99.791 kg)  09/13/15 221 lb 9.6 oz (100.517 kg)  Head and neck exam shows no ocular or oral lesions. He has no palpable cervical or supraclavicular lymph nodes. Lungs are clear. Cardiac exam regular rate and rhythm with no murmurs, rubs or bruits. Abdomen is soft. He has good bowel sounds. There is no fluid wave. There is no palpable liver or spleen tip. Axillary exam shows no bilateral axillary adenopathy. Back exam shows no tenderness over the spine, ribs or hips. Extremities shows no clubbing, cyanosis or edema. He has good range of motion of his joints. He does have some osteoarthritic changes in his joints. Skin exam shows no rashes, ecchymoses or petechia. Neurological exam shows no focal neurological deficits.  Lab Results  Component Value  Date   WBC 4.6 09/26/2015   HGB 11.3* 09/26/2015   HCT 33.9* 09/26/2015   MCV 102* 09/26/2015   PLT 141* 09/26/2015     Chemistry      Component Value Date/Time   NA 140 05/13/2015 1051   NA 141 11/15/2014   K 4.3 05/13/2015 1051   K 3.9 11/15/2014   CO2 30* 05/13/2015 1051   BUN 25.7 05/13/2015 1051   BUN 23* 11/15/2014   CREATININE 1.3 05/13/2015 1051   CREATININE 1.5* 11/15/2014   GLU 90 11/15/2014      Component Value Date/Time   CALCIUM 9.7 05/13/2015 1051   ALKPHOS 58 05/13/2015 1051   ALKPHOS 70 11/15/2014   AST 19 05/13/2015 1051   AST 21 11/15/2014   ALT 14 05/13/2015 1051   ALT 29 11/15/2014   BILITOT 0.77 05/13/2015 1051         Impression and Plan: Jeremiah Walker is a 82 year old white male. He had a history of diffuse large cell non-Hodgkin's lymphoma. History of with 6 cycles of R-CHOP. He got this treatment up in [place]. He completed this back in April 2014.   I am thankful that his hemoglobin is a little bit better. He still has some anemia. Back in February, his iron studies did show a slight decrease in iron. His ferritin was elevated which might be an acute phase reactant. His erythropoietin level was only 19.  It is possible that we may have to consider some iron for him.  I will like to see him back in a couple months. He is planning to go to [place] in July. I want to make sure that his blood is as good as possible for a goes up.  I spent about 30 minutes with him today.     [name], MD 4/3/20172:46 PM

## 2015-09-26 NOTE — Addendum Note (Signed)
Addended by: Raiford Noble on: 09/26/2015 03:07 PM   Modules accepted: Orders

## 2015-09-26 NOTE — Progress Notes (Signed)
Pre visit review using our clinic review tool, if applicable. No additional management support is needed unless otherwise documented below in the visit note. 

## 2015-09-26 NOTE — Assessment & Plan Note (Signed)
Continue Cardura. Diet is a big contributor. Will begin DASH diet. Follow-up 4 weeks.

## 2015-09-26 NOTE — Patient Instructions (Signed)
Please continue wearing splint and applying Aspercreme to the area. I am glad you are feeling much better. Expect symptoms to resolve over the next 1-2 weeks.  You will be contacted by PT at [facility] for assessment.   Continue your Cardura as directed. Watch the salt content in your foods. Follow the diet below.  DASH Eating Plan DASH stands for "Dietary Approaches to Stop Hypertension." The DASH eating plan is a healthy eating plan that has been shown to reduce high blood pressure (hypertension). Additional health benefits may include reducing the risk of type 2 diabetes mellitus, heart disease, and stroke. The DASH eating plan may also help with weight loss. WHAT DO I NEED TO KNOW ABOUT THE DASH EATING PLAN? For the DASH eating plan, you will follow these general guidelines:  Choose foods with a percent daily value for sodium of less than 5% (as listed on the food label).  Use salt-free seasonings or herbs instead of table salt or sea salt.  Check with your health care provider or pharmacist before using salt substitutes.  Eat lower-sodium products, often labeled as "lower sodium" or "no salt added."  Eat fresh foods.  Eat more vegetables, fruits, and low-fat dairy products.  Choose whole grains. Look for the word "whole" as the first word in the ingredient list.  Choose fish and skinless chicken or [place] more often than red meat. Limit fish, poultry, and meat to 6 oz (170 g) each day.  Limit sweets, desserts, sugars, and sugary drinks.  Choose heart-healthy fats.  Limit cheese to 1 oz (28 g) per day.  Eat more home-cooked food and less restaurant, buffet, and fast food.  Limit fried foods.  Cook foods using methods other than frying.  Limit canned vegetables. If you do use them, rinse them well to decrease the sodium.  When eating at a restaurant, ask that your food be prepared with less salt, or no salt if possible. WHAT FOODS CAN I EAT? Seek help from a dietitian  for individual calorie needs. Grains Whole grain or whole wheat bread. Brown rice. Whole grain or whole wheat pasta. Quinoa, bulgur, and whole grain cereals. Low-sodium cereals. Corn or whole wheat flour tortillas. Whole grain cornbread. Whole grain crackers. Low-sodium crackers. Vegetables Fresh or frozen vegetables (raw, steamed, roasted, or grilled). Low-sodium or reduced-sodium tomato and vegetable juices. Low-sodium or reduced-sodium tomato sauce and paste. Low-sodium or reduced-sodium canned vegetables.  Fruits All fresh, canned (in natural juice), or frozen fruits. Meat and Other Protein Products Ground beef (85% or leaner), grass-fed beef, or beef trimmed of fat. Skinless chicken or [place]. Ground chicken or [place]. Pork trimmed of fat. All fish and seafood. Eggs. Dried beans, peas, or lentils. Unsalted nuts and seeds. Unsalted canned beans. Dairy Low-fat dairy products, such as skim or 1% milk, 2% or reduced-fat cheeses, low-fat ricotta or cottage cheese, or plain low-fat yogurt. Low-sodium or reduced-sodium cheeses. Fats and Oils Tub margarines without trans fats. Light or reduced-fat mayonnaise and salad dressings (reduced sodium). Avocado. Safflower, olive, or canola oils. Natural peanut or almond butter. Other Unsalted popcorn and pretzels. The items listed above may not be a complete list of recommended foods or beverages. Contact your dietitian for more options. WHAT FOODS ARE NOT RECOMMENDED? Grains White bread. White pasta. White rice. Refined cornbread. Bagels and croissants. Crackers that contain trans fat. Vegetables Creamed or fried vegetables. Vegetables in a cheese sauce. Regular canned vegetables. Regular canned tomato sauce and paste. Regular tomato and vegetable juices. Fruits Dried fruits.  Canned fruit in light or heavy syrup. Fruit juice. Meat and Other Protein Products Fatty cuts of meat. Ribs, chicken wings, bacon, sausage, bologna, salami, chitterlings, fatback,  hot dogs, bratwurst, and packaged luncheon meats. Salted nuts and seeds. Canned beans with salt. Dairy Whole or 2% milk, cream, half-and-half, and cream cheese. Whole-fat or sweetened yogurt. Full-fat cheeses or blue cheese. Nondairy creamers and whipped toppings. Processed cheese, cheese spreads, or cheese curds. Condiments Onion and garlic salt, seasoned salt, table salt, and sea salt. Canned and packaged gravies. Worcestershire sauce. Tartar sauce. Barbecue sauce. Teriyaki sauce. Soy sauce, including reduced sodium. Steak sauce. Fish sauce. Oyster sauce. Cocktail sauce. Horseradish. Ketchup and mustard. Meat flavorings and tenderizers. Bouillon cubes. Hot sauce. Tabasco sauce. Marinades. Taco seasonings. Relishes. Fats and Oils Butter, stick margarine, lard, shortening, ghee, and bacon fat. Coconut, palm kernel, or palm oils. Regular salad dressings. Other Pickles and olives. Salted popcorn and pretzels. The items listed above may not be a complete list of foods and beverages to avoid. Contact your dietitian for more information. WHERE CAN I FIND MORE INFORMATION? National Heart, Lung, and Blood Institute: travelstabloid.com   This information is not intended to replace advice given to you by your health care provider. Make sure you discuss any questions you have with your health care provider.   Document Released: 05/31/2011 Document Revised: 07/02/2014 Document Reviewed: 04/15/2013 Elsevier Interactive Patient Education [employer].

## 2015-09-27 LAB — FERRITIN: Ferritin: 233 ng/ml (ref 22–316)

## 2015-09-27 LAB — RETICULOCYTES: Reticulocyte Count: 1.6 % (ref 0.6–2.6)

## 2015-09-27 LAB — IRON AND TIBC
%SAT: 39 % (ref 20–55)
Iron: 90 ug/dL (ref 42–163)
TIBC: 230 ug/dL (ref 202–409)
UIBC: 140 ug/dL (ref 117–376)

## 2015-09-27 LAB — LACTATE DEHYDROGENASE: LDH: 177 U/L (ref 125–245)

## 2015-10-04 DIAGNOSIS — M4806 Spinal stenosis, lumbar region: Secondary | ICD-10-CM | POA: Diagnosis not present

## 2015-10-04 DIAGNOSIS — R2689 Other abnormalities of gait and mobility: Secondary | ICD-10-CM | POA: Diagnosis not present

## 2015-10-04 DIAGNOSIS — G8929 Other chronic pain: Secondary | ICD-10-CM | POA: Diagnosis not present

## 2015-10-04 DIAGNOSIS — M6281 Muscle weakness (generalized): Secondary | ICD-10-CM | POA: Diagnosis not present

## 2015-10-11 DIAGNOSIS — G8929 Other chronic pain: Secondary | ICD-10-CM | POA: Diagnosis not present

## 2015-10-11 DIAGNOSIS — M4806 Spinal stenosis, lumbar region: Secondary | ICD-10-CM | POA: Diagnosis not present

## 2015-10-11 DIAGNOSIS — R2689 Other abnormalities of gait and mobility: Secondary | ICD-10-CM | POA: Diagnosis not present

## 2015-10-11 DIAGNOSIS — M6281 Muscle weakness (generalized): Secondary | ICD-10-CM | POA: Diagnosis not present

## 2015-10-13 DIAGNOSIS — M4806 Spinal stenosis, lumbar region: Secondary | ICD-10-CM | POA: Diagnosis not present

## 2015-10-13 DIAGNOSIS — M6281 Muscle weakness (generalized): Secondary | ICD-10-CM | POA: Diagnosis not present

## 2015-10-13 DIAGNOSIS — R2689 Other abnormalities of gait and mobility: Secondary | ICD-10-CM | POA: Diagnosis not present

## 2015-10-13 DIAGNOSIS — G8929 Other chronic pain: Secondary | ICD-10-CM | POA: Diagnosis not present

## 2015-10-18 DIAGNOSIS — R2689 Other abnormalities of gait and mobility: Secondary | ICD-10-CM | POA: Diagnosis not present

## 2015-10-18 DIAGNOSIS — M6281 Muscle weakness (generalized): Secondary | ICD-10-CM | POA: Diagnosis not present

## 2015-10-18 DIAGNOSIS — M4806 Spinal stenosis, lumbar region: Secondary | ICD-10-CM | POA: Diagnosis not present

## 2015-10-18 DIAGNOSIS — G8929 Other chronic pain: Secondary | ICD-10-CM | POA: Diagnosis not present

## 2015-10-19 ENCOUNTER — Telehealth: Payer: Self-pay | Admitting: Physician Assistant

## 2015-10-19 DIAGNOSIS — Z79891 Long term (current) use of opiate analgesic: Secondary | ICD-10-CM | POA: Diagnosis not present

## 2015-10-19 DIAGNOSIS — Z79899 Other long term (current) drug therapy: Secondary | ICD-10-CM | POA: Diagnosis not present

## 2015-10-19 DIAGNOSIS — G894 Chronic pain syndrome: Secondary | ICD-10-CM

## 2015-10-19 MED ORDER — HYDROCODONE-ACETAMINOPHEN 7.5-325 MG PO TABS: 1.0000 | ORAL_TABLET | Freq: Three times a day (TID) | ORAL | Status: DC

## 2015-10-19 MED ORDER — ALPRAZOLAM 0.5 MG PO TABS: 0.5000 mg | ORAL_TABLET | Freq: Two times a day (BID) | ORAL | Status: DC | PRN

## 2015-10-19 NOTE — Telephone Encounter (Signed)
Can be reached: [phone]   Reason for call: Pt would like written RX for hydrocodone and alprazolam. He has enough til the 1st of the month. Please call when ready for pick up.

## 2015-10-19 NOTE — Telephone Encounter (Signed)
Called and spoke with the pt and informed him that the refill request has been approved and the prescription will be placed up front for him to pick up.  Pt verbalized understanding and stated that he will come by tomorrow.//AB/CMA

## 2015-10-19 NOTE — Telephone Encounter (Signed)
Requesting Hydrocodone-acet. 7.5-325mg -Take 1 tablet by mouth three times daily.                    Alprazolam 0.5mg -Take 1 tablet by mouth two times daily as needed for anxiety. Last refill:09/26/15;#90,0                09/26/15;#60,0 Last OV:09/26/15 USD:CSC signed but no UDS sample given. Please advise.//AB/CMA

## 2015-10-19 NOTE — Telephone Encounter (Signed)
Ok to print refills. He needs to give UDS sample at time of pickup.

## 2015-10-20 DIAGNOSIS — M4806 Spinal stenosis, lumbar region: Secondary | ICD-10-CM | POA: Diagnosis not present

## 2015-10-20 DIAGNOSIS — G8929 Other chronic pain: Secondary | ICD-10-CM | POA: Diagnosis not present

## 2015-10-20 DIAGNOSIS — R2689 Other abnormalities of gait and mobility: Secondary | ICD-10-CM | POA: Diagnosis not present

## 2015-10-20 DIAGNOSIS — M6281 Muscle weakness (generalized): Secondary | ICD-10-CM | POA: Diagnosis not present

## 2015-10-25 DIAGNOSIS — G8929 Other chronic pain: Secondary | ICD-10-CM | POA: Diagnosis not present

## 2015-10-25 DIAGNOSIS — M4806 Spinal stenosis, lumbar region: Secondary | ICD-10-CM | POA: Diagnosis not present

## 2015-10-25 DIAGNOSIS — M6281 Muscle weakness (generalized): Secondary | ICD-10-CM | POA: Diagnosis not present

## 2015-10-25 DIAGNOSIS — R2689 Other abnormalities of gait and mobility: Secondary | ICD-10-CM | POA: Diagnosis not present

## 2015-10-27 DIAGNOSIS — G8929 Other chronic pain: Secondary | ICD-10-CM | POA: Diagnosis not present

## 2015-10-27 DIAGNOSIS — R2689 Other abnormalities of gait and mobility: Secondary | ICD-10-CM | POA: Diagnosis not present

## 2015-10-27 DIAGNOSIS — M6281 Muscle weakness (generalized): Secondary | ICD-10-CM | POA: Diagnosis not present

## 2015-10-27 DIAGNOSIS — M4806 Spinal stenosis, lumbar region: Secondary | ICD-10-CM | POA: Diagnosis not present

## 2015-11-01 DIAGNOSIS — M6281 Muscle weakness (generalized): Secondary | ICD-10-CM | POA: Diagnosis not present

## 2015-11-01 DIAGNOSIS — R2689 Other abnormalities of gait and mobility: Secondary | ICD-10-CM | POA: Diagnosis not present

## 2015-11-01 DIAGNOSIS — M4806 Spinal stenosis, lumbar region: Secondary | ICD-10-CM | POA: Diagnosis not present

## 2015-11-01 DIAGNOSIS — G8929 Other chronic pain: Secondary | ICD-10-CM | POA: Diagnosis not present

## 2015-11-03 DIAGNOSIS — G8929 Other chronic pain: Secondary | ICD-10-CM | POA: Diagnosis not present

## 2015-11-03 DIAGNOSIS — M4806 Spinal stenosis, lumbar region: Secondary | ICD-10-CM | POA: Diagnosis not present

## 2015-11-03 DIAGNOSIS — R2689 Other abnormalities of gait and mobility: Secondary | ICD-10-CM | POA: Diagnosis not present

## 2015-11-03 DIAGNOSIS — M6281 Muscle weakness (generalized): Secondary | ICD-10-CM | POA: Diagnosis not present

## 2015-11-07 ENCOUNTER — Telehealth: Payer: Self-pay | Admitting: Physician Assistant

## 2015-11-07 MED ORDER — AMOXICILLIN 500 MG PO CAPS: 2000.0000 mg | ORAL_CAPSULE | ORAL | Status: DC

## 2015-11-07 NOTE — Telephone Encounter (Signed)
Can be reached: [phone] Pharmacy: [id number] - [place], [place] - [address] AT [address]   Reason for call: Pt needing refill on amoxicillin. Takes 4 500mg  capsules 1 hour prior to dental work. Please call in to disp #8 qty as was done last fill.

## 2015-11-07 NOTE — Telephone Encounter (Signed)
Sent in 4 tablets -- enough for dental procedure. Take as directed.

## 2015-11-08 DIAGNOSIS — G8929 Other chronic pain: Secondary | ICD-10-CM | POA: Diagnosis not present

## 2015-11-08 DIAGNOSIS — M6281 Muscle weakness (generalized): Secondary | ICD-10-CM | POA: Diagnosis not present

## 2015-11-08 DIAGNOSIS — R2689 Other abnormalities of gait and mobility: Secondary | ICD-10-CM | POA: Diagnosis not present

## 2015-11-08 DIAGNOSIS — M4806 Spinal stenosis, lumbar region: Secondary | ICD-10-CM | POA: Diagnosis not present

## 2015-11-08 NOTE — Telephone Encounter (Signed)
Patient informed, understood & agreed/SLS 05/16

## 2015-11-10 DIAGNOSIS — M4806 Spinal stenosis, lumbar region: Secondary | ICD-10-CM | POA: Diagnosis not present

## 2015-11-10 DIAGNOSIS — M6281 Muscle weakness (generalized): Secondary | ICD-10-CM | POA: Diagnosis not present

## 2015-11-10 DIAGNOSIS — G8929 Other chronic pain: Secondary | ICD-10-CM | POA: Diagnosis not present

## 2015-11-10 DIAGNOSIS — R2689 Other abnormalities of gait and mobility: Secondary | ICD-10-CM | POA: Diagnosis not present

## 2015-11-15 DIAGNOSIS — R2689 Other abnormalities of gait and mobility: Secondary | ICD-10-CM | POA: Diagnosis not present

## 2015-11-15 DIAGNOSIS — G8929 Other chronic pain: Secondary | ICD-10-CM | POA: Diagnosis not present

## 2015-11-15 DIAGNOSIS — M6281 Muscle weakness (generalized): Secondary | ICD-10-CM | POA: Diagnosis not present

## 2015-11-15 DIAGNOSIS — M4806 Spinal stenosis, lumbar region: Secondary | ICD-10-CM | POA: Diagnosis not present

## 2015-11-18 ENCOUNTER — Telehealth: Payer: Self-pay | Admitting: Physician Assistant

## 2015-11-18 DIAGNOSIS — G894 Chronic pain syndrome: Secondary | ICD-10-CM

## 2015-11-18 MED ORDER — HYDROCODONE-ACETAMINOPHEN 7.5-325 MG PO TABS: 1.0000 | ORAL_TABLET | Freq: Three times a day (TID) | ORAL | Status: DC

## 2015-11-18 MED ORDER — ALPRAZOLAM 0.5 MG PO TABS: 0.5000 mg | ORAL_TABLET | Freq: Two times a day (BID) | ORAL | Status: DC | PRN

## 2015-11-18 MED ORDER — AMOXICILLIN 500 MG PO CAPS: 2000.0000 mg | ORAL_CAPSULE | ORAL | Status: DC

## 2015-11-18 NOTE — Telephone Encounter (Signed)
LMOM informing Pt that Amoxicillin Rx sent to [place] and alprazolam and hydrocodone prescriptions were ready for pick up at the front desk at his convenience. Instructed him to call if questions or concerns.

## 2015-11-18 NOTE — Telephone Encounter (Signed)
Please advise 

## 2015-11-18 NOTE — Telephone Encounter (Signed)
Relation to [id number] Call back number:[phone] Pharmacy: [id number] - [place], [place] - [address] AT [place] [phone] ([phone] (Fax)         Reason for call:   Patient is having a tooth pull next week requesting amoxicillin (AMOXIL) 500 MG capsule and 90 day supply of HYDROcodone-acetaminophen (NORCO) 7.5-325 MG tablet and ALPRAZolam (XANAX) 0.5 MG tablet due to anxiety. Patient states physical therapy is going well slight discomfort regarding he's lumbar. Patient would like to pick up Rx today please advise

## 2015-11-18 NOTE — Telephone Encounter (Signed)
Amoxicillin sent electronically. Rx for Xanax and Hydrocodone printed for pick up. Check on contract. If not on file, will have to sign one when picking up medication.

## 2015-11-18 NOTE — Telephone Encounter (Signed)
Contract signed on 08/05/2015. Will inform Pt ready for pick up.

## 2015-11-22 NOTE — Telephone Encounter (Signed)
Patient states he nees an additional 8 pills of amoxicillin (AMOXIL) 500 MG capsule due to the fact the 4 prescribed he will use tomorrow at dentist appointment. Patient states he is having additional dental work therefore needs additional pills. Patient states he would like to pick up medication from pharmacy tomorrow after dentist appointment. Please advise

## 2015-11-23 MED ORDER — AMOXICILLIN 500 MG PO CAPS: 2000.0000 mg | ORAL_CAPSULE | ORAL | Status: DC

## 2015-11-23 NOTE — Telephone Encounter (Addendum)
Patient states that 1) he is having tooth removal today, 2) cleaning next week, 3) a bridge next month [June] and his Dentist told him that "it is Illegal for them to prescribe the ABX"[?]; informed patient that that is incorrect, as dentist have authority to prescribe ABX when they perceive them as required for a dental procedure. Patient then stated that his "joint doctor" required him to have the Antibiotic Prophylaxis Prior to Dental Procedures; again informed patient of the ADA indications for ABX prophylaxis for dental procedure in connection with joint [hip] replacement. Patient became frustrated and stated that "he did not want to have to have a fight with me"; reiterated provider instructions and pt stated that "if he couldn't get his request, then he would just have to find a new PA". Asked patient to let me speak with Einar Pheasant and get back to him; pt agreed. Called patient back and LMOM that ABX Rx sent to pharmacy, but please refer future request to Dentist and also that pharmacy states that pt was given normal dispense amount of #60 for 30-day supply on Alprazolam 11/19/15, regarding inquiry as to "being shorted on Alprazolam" and that id his needs have changed with this medication that he will need OV to discuss with provider/SLS 05/31

## 2015-11-23 NOTE — Addendum Note (Signed)
Addended by: Rockwell Germany on: 11/23/2015 11:24 AM   Modules accepted: Orders

## 2015-11-23 NOTE — Telephone Encounter (Signed)
Find out what procedures he is having and when they are scheduled. Also I would recommend he discuss antibiotics with his dentist -- If they are doing the procedures and recommend the antibiotic, they can prescribe them. At current recommendations -- antibiotics are only used if there is a history of valvular disorder. History of joint replacements no longer warrants antibiotic prophylaxis.

## 2015-11-23 NOTE — Telephone Encounter (Signed)
LMOM with contact name and number for return call RE: medication request and further provider instructions/SLS 05/31

## 2015-11-30 ENCOUNTER — Ambulatory Visit (HOSPITAL_BASED_OUTPATIENT_CLINIC_OR_DEPARTMENT_OTHER): Payer: Medicare Other | Admitting: Hematology & Oncology

## 2015-11-30 ENCOUNTER — Other Ambulatory Visit (HOSPITAL_BASED_OUTPATIENT_CLINIC_OR_DEPARTMENT_OTHER): Payer: Medicare Other

## 2015-11-30 ENCOUNTER — Encounter: Payer: Self-pay | Admitting: Hematology & Oncology

## 2015-11-30 VITALS — BP 126/63 | HR 58 | Temp 97.9°F | Resp 18 | Ht 69.0 in | Wt 220.0 lb

## 2015-11-30 DIAGNOSIS — T451X5A Adverse effect of antineoplastic and immunosuppressive drugs, initial encounter: Principal | ICD-10-CM

## 2015-11-30 DIAGNOSIS — Z8579 Personal history of other malignant neoplasms of lymphoid, hematopoietic and related tissues: Secondary | ICD-10-CM | POA: Diagnosis not present

## 2015-11-30 DIAGNOSIS — D6481 Anemia due to antineoplastic chemotherapy: Secondary | ICD-10-CM

## 2015-11-30 DIAGNOSIS — C8330 Diffuse large B-cell lymphoma, unspecified site: Secondary | ICD-10-CM

## 2015-11-30 DIAGNOSIS — D509 Iron deficiency anemia, unspecified: Secondary | ICD-10-CM | POA: Diagnosis not present

## 2015-11-30 LAB — CBC WITH DIFFERENTIAL (CANCER CENTER ONLY)
BASO#: 0.1 10*3/uL (ref 0.0–0.2)
BASO%: 1.8 % (ref 0.0–2.0)
EOS%: 1.6 % (ref 0.0–7.0)
Eosinophils Absolute: 0.1 10*3/uL (ref 0.0–0.5)
HEMATOCRIT: 33.5 % — AB (ref 38.7–49.9)
HEMOGLOBIN: 11.3 g/dL — AB (ref 13.0–17.1)
LYMPH#: 1.6 10*3/uL (ref 0.9–3.3)
LYMPH%: 32.3 % (ref 14.0–48.0)
MCH: 34.8 pg — ABNORMAL HIGH (ref 28.0–33.4)
MCHC: 33.7 g/dL (ref 32.0–35.9)
MCV: 103 fL — AB (ref 82–98)
MONO#: 1.8 10*3/uL — AB (ref 0.1–0.9)
MONO%: 36.2 % — ABNORMAL HIGH (ref 0.0–13.0)
NEUT%: 28.1 % — AB (ref 40.0–80.0)
NEUTROS ABS: 1.4 10*3/uL — AB (ref 1.5–6.5)
PLATELETS: 128 10*3/uL — AB (ref 145–400)
RBC: 3.25 10*6/uL — ABNORMAL LOW (ref 4.20–5.70)
RDW: 13.3 % (ref 11.1–15.7)
WBC: 5.1 10*3/uL (ref 4.0–10.0)

## 2015-11-30 LAB — COMPREHENSIVE METABOLIC PANEL WITH GFR
ALT: 13 U/L (ref 0–55)
AST: 19 U/L (ref 5–34)
Albumin: 3.7 g/dL (ref 3.5–5.0)
Alkaline Phosphatase: 51 U/L (ref 40–150)
Anion Gap: 8 meq/L (ref 3–11)
BUN: 24.6 mg/dL (ref 7.0–26.0)
CO2: 26 meq/L (ref 22–29)
Calcium: 9.2 mg/dL (ref 8.4–10.4)
Chloride: 106 meq/L (ref 98–109)
Creatinine: 1.3 mg/dL (ref 0.7–1.3)
EGFR: 48 ml/min/1.73 m2 — ABNORMAL LOW
Glucose: 86 mg/dL (ref 70–140)
Potassium: 4.2 meq/L (ref 3.5–5.1)
Sodium: 140 meq/L (ref 136–145)
Total Bilirubin: 0.58 mg/dL (ref 0.20–1.20)
Total Protein: 7.2 g/dL (ref 6.4–8.3)

## 2015-11-30 LAB — IRON AND TIBC
%SAT: 39 % (ref 20–55)
Iron: 85 ug/dL (ref 42–163)
TIBC: 219 ug/dL (ref 202–409)
UIBC: 134 ug/dL (ref 117–376)

## 2015-11-30 LAB — CHCC SATELLITE - SMEAR

## 2015-11-30 LAB — FERRITIN: Ferritin: 188 ng/ml (ref 22–316)

## 2015-11-30 NOTE — Progress Notes (Signed)
Hematology and Oncology Follow Up Visit  JAAN FISCHEL [id number] 09/10/1927 82 y.o. 11/30/2015   Principle Diagnosis:   History of diffuse large cell non-Hodgkin's lymphoma-treated in [place]  Macrocytic anemia  Current Therapy:    Observation     Interim History:  Mr. Sweetser is back for follow-up. He looks a little better. He is exercising. He is enjoying this.  His base problem is neuropathy. He has neuropathy. He is on medication for this. We last saw him, I was worried about his hemoglobin dropping. I checked his iron studies. His ferritin was 233. His iron saturation was 39. His total serum iron was only 90. His LDH was 177.   His erythropoietin level was only 19.  He's had no bleeding. His appetite has been doing pretty well. He is going to physical therapy.  He's had no rashes. He's had no weight loss or weight gain.  He's not noted any change in bowel or bladder habits.   Overall, his performance status is ECOG 1.  Medications:  Current outpatient prescriptions:  .  ALPRAZolam (XANAX) 0.5 MG tablet, Take 1 tablet (0.5 mg total) by mouth 2 (two) times daily as needed for anxiety., Disp: 60 tablet, Rfl: 0 .  amoxicillin (AMOXIL) 500 MG capsule, Take 4 capsules (2,000 mg total) by mouth as directed. ONE HOUR PRIOR TO DENTAL PROCEDURES, Disp: 4 capsule, Rfl: 1 .  bethanechol (URECHOLINE) 25 MG tablet, TK 1 T PO  TID, Disp: 90 tablet, Rfl: 1 .  Cholecalciferol (VITAMIN D3) 2000 UNITS capsule, Take 2,000 Units by mouth daily., Disp: , Rfl:  .  doxazosin (CARDURA) 2 MG tablet, TK 1 T PO  BID, Disp: 180 tablet, Rfl: 1 .  gabapentin (NEURONTIN) 300 MG capsule, TK 3 CS PO TID PLUS 2 CS QD PRN, Disp: 540 capsule, Rfl: 1 .  HYDROcodone-acetaminophen (NORCO) 7.5-325 MG tablet, Take 1 tablet by mouth 3 (three) times daily., Disp: 90 tablet, Rfl: 0 .  niacin 500 MG tablet, Take 500 mg by mouth at bedtime., Disp: , Rfl:  .  Omega-3 Fatty Acids (OMEGA-3 FISH OIL PO), Take 2 each  by mouth every morning. EPA/DHA 520/350, Disp: , Rfl:  .  polyethylene glycol (MIRALAX / GLYCOLAX) packet, Take 17 g by mouth daily as needed., Disp: , Rfl:  .  psyllium (METAMUCIL) 58.6 % powder, Take 1 packet by mouth daily., Disp: , Rfl:  .  S-Adenosylmethionine (SAM-E) 400 MG TABS, Take 400 mg by mouth daily., Disp: , Rfl:   Allergies:  Allergies  Allergen Reactions  . Nsaids Other (See Comments) and Tinitus    Bruising   . Ciprofloxacin     Other reaction(s): Other (See Comments) Achilles tendon  . Statins     Other reaction(s): Other (See Comments) Muscle pain  . Requip [Ropinirole Hcl] Anxiety  . Ropinirole Anxiety    Mood swing    Past Medical History, Surgical history, Social history, and Family History were reviewed and updated.  Review of Systems: As above  Physical Exam:  height is '5\' 9"'  (1.753 m) and weight is 220 lb (99.791 kg). His oral temperature is 97.9 F (36.6 C). His blood pressure is 126/63 and his pulse is 58. His respiration is 18.   Wt Readings from Last 3 Encounters:  11/30/15 220 lb (99.791 kg)  09/26/15 220 lb 6.4 oz (99.973 kg)  09/26/15 220 lb (99.791 kg)     Head and neck exam shows no ocular or oral lesions. He has no palpable  cervical or supraclavicular lymph nodes. Lungs are clear. Cardiac exam regular rate and rhythm with no murmurs, rubs or bruits. Abdomen is soft. He has good bowel sounds. There is no fluid wave. There is no palpable liver or spleen tip. Axillary exam shows no bilateral axillary adenopathy. Back exam shows no tenderness over the spine, ribs or hips. Extremities shows no clubbing, cyanosis or edema. He has good range of motion of his joints. He does have some osteoarthritic changes in his joints. Skin exam shows no rashes, ecchymoses or petechia. Neurological exam shows no focal neurological deficits.  Lab Results  Component Value Date   WBC 5.1 11/30/2015   HGB 11.3* 11/30/2015   HCT 33.5* 11/30/2015   MCV 103*  11/30/2015   PLT 128* 11/30/2015     Chemistry      Component Value Date/Time   NA 140 05/13/2015 1051   NA 141 11/15/2014   K 4.3 05/13/2015 1051   K 3.9 11/15/2014   CO2 30* 05/13/2015 1051   BUN 25.7 05/13/2015 1051   BUN 23* 11/15/2014   CREATININE 1.3 05/13/2015 1051   CREATININE 1.5* 11/15/2014   GLU 90 11/15/2014      Component Value Date/Time   CALCIUM 9.7 05/13/2015 1051   ALKPHOS 58 05/13/2015 1051   ALKPHOS 70 11/15/2014   AST 19 05/13/2015 1051   AST 21 11/15/2014   ALT 14 05/13/2015 1051   ALT 29 11/15/2014   BILITOT 0.77 05/13/2015 1051         Impression and Plan: Mr. Faux is a 82 year old white male. He had a history of diffuse large cell non-Hodgkin's lymphoma. History of with 6 cycles of R-CHOP. He got this treatment up in [place]. He completed this back in April 2014.   His blood counts really are holding pretty steady. His blood count is dropping slowly. He is totally asymptomatic with this.  I think we can probably get him through the summertime. He will be going up to [place]. [place]. There will be a family gathering which he really wants to see. He also will be going to a grandson's wedding.   I don't see that we have to do a bone marrow biopsy on him as of yet.   His peripheral blood smear does not look suspicious. He does have an increase in monocytes. I really do not see any immature myeloid cells. I do not see any nucleated red cells. The platelets appear adequate. Platelets are well granulated.   I spent  30 minutes with him today.     [name], MD 6/7/20171:12 PM

## 2015-12-01 LAB — RETICULOCYTES: Reticulocyte Count: 1.2 % (ref 0.6–2.6)

## 2015-12-12 ENCOUNTER — Other Ambulatory Visit: Payer: Self-pay | Admitting: Physician Assistant

## 2015-12-12 ENCOUNTER — Telehealth: Payer: Self-pay | Admitting: *Deleted

## 2015-12-12 DIAGNOSIS — G894 Chronic pain syndrome: Secondary | ICD-10-CM

## 2015-12-12 NOTE — Telephone Encounter (Signed)
Please call patient to assess concerns. I believe we just send in refills of the amoxicillin to use before dental procedures.

## 2015-12-12 NOTE — Telephone Encounter (Signed)
amoxicillin   Received: Today    [place], CMA    Phone Number: [phone]            Relation to [id number]  Call back number:[phone]    Reason for call:  Patient returning your call would like to discuss amoxicillin prescription. Please advise

## 2015-12-13 MED ORDER — ALPRAZOLAM 0.5 MG PO TABS: 0.5000 mg | ORAL_TABLET | Freq: Two times a day (BID) | ORAL | Status: DC | PRN

## 2015-12-13 MED ORDER — HYDROCODONE-ACETAMINOPHEN 7.5-325 MG PO TABS: 1.0000 | ORAL_TABLET | Freq: Three times a day (TID) | ORAL | Status: DC

## 2015-12-13 NOTE — Telephone Encounter (Signed)
Spoke with pt and he voices understanding. Pt was advised that his xanax was faxed over to the pharmacy and he could pick up the hydrocodone up front tomorrow.  Pt did not have any further questions.

## 2015-12-13 NOTE — Telephone Encounter (Signed)
I do not do 90-day supplies of controlled medications.  With the Xanax it is ok to give 30-day supply with 2 refills which would make a 90-days before he would need to call for refills. Hydrocodone is to stay the same.  Ok to fax in Xanax if due. Ok to print refill of Hydrocodone if due and I will sign so he can pick up.

## 2015-12-13 NOTE — Telephone Encounter (Signed)
Spoke with pt and he states that he was told that he would not need the amoxicillin for the procedure but he will keep taking this medication.  Pt would also like a refill on the Xanax and the hydrocodone. Pt would like to know if he can have a 90 day supply of both medications. Both were last filled 11/18/15. Please advise.

## 2015-12-18 ENCOUNTER — Other Ambulatory Visit: Payer: Self-pay | Admitting: Physician Assistant

## 2015-12-18 DIAGNOSIS — G894 Chronic pain syndrome: Secondary | ICD-10-CM

## 2015-12-21 ENCOUNTER — Other Ambulatory Visit: Payer: Self-pay | Admitting: Physician Assistant

## 2015-12-21 DIAGNOSIS — G894 Chronic pain syndrome: Secondary | ICD-10-CM

## 2015-12-21 NOTE — Telephone Encounter (Signed)
Refilled on 12/13/2015 by Einar Pheasant.

## 2015-12-22 ENCOUNTER — Encounter: Payer: Self-pay | Admitting: Physician Assistant

## 2016-01-08 ENCOUNTER — Other Ambulatory Visit: Payer: Self-pay | Admitting: Physician Assistant

## 2016-01-09 NOTE — Telephone Encounter (Signed)
Refill sent per [facility] refill protocol/SLS  

## 2016-01-11 ENCOUNTER — Telehealth: Payer: Self-pay | Admitting: Physician Assistant

## 2016-01-11 NOTE — Telephone Encounter (Signed)
Received medical request from [place]. Requesting pt's current medication list.

## 2016-01-11 NOTE — Telephone Encounter (Signed)
Faxed over current medication list to Janett [facility] at : [phone]

## 2016-01-20 ENCOUNTER — Other Ambulatory Visit: Payer: Self-pay | Admitting: Physician Assistant

## 2016-01-20 DIAGNOSIS — G894 Chronic pain syndrome: Secondary | ICD-10-CM

## 2016-01-21 ENCOUNTER — Other Ambulatory Visit: Payer: Self-pay | Admitting: Physician Assistant

## 2016-01-21 DIAGNOSIS — G894 Chronic pain syndrome: Secondary | ICD-10-CM

## 2016-01-23 MED ORDER — HYDROCODONE-ACETAMINOPHEN 7.5-325 MG PO TABS: 1.0000 | ORAL_TABLET | Freq: Three times a day (TID) | ORAL | 0 refills | Status: DC

## 2016-01-23 NOTE — Telephone Encounter (Signed)
Patient  Called to check the status of refill request [phone]

## 2016-01-23 NOTE — Telephone Encounter (Addendum)
Rx request for Controlled Substance have an "up to 72 hour turn around for business days" and patient was to have F/U appointment the first week of May 2017. Will have Dr. Charlett Blake sign Rx in absence of PCP/SLS 07/31  Patient inform Rx is ready for pick-up, and that he must schedule F/U appointment for Blood pressure prior to future refill authorizations per provider; understood & agreed/SLS 07/31

## 2016-01-30 ENCOUNTER — Ambulatory Visit: Payer: Medicare Other | Admitting: Physician Assistant

## 2016-01-30 NOTE — Progress Notes (Deleted)
  Patient presents to clinic today for follow-up of hypertension. Patient currently on a regimen of ***. Patient denies chest pain, palpitations, lightheadedness, dizziness, vision changes or frequent headaches.  BP Readings from Last 3 Encounters:  11/30/15 126/63  09/26/15 (!) 148/60  09/26/15 (!) 149/87   Past Medical History:  Diagnosis Date  . Anemia   . Anemia due to antineoplastic chemotherapy 08/15/2015  . Anxiety   . Arthritis   . Cancer (HCC)   . Cataract   . Chronic kidney disease (CKD), stage III (moderate) 02/09/2015   Controlled  . Combined fat and carbohydrate induced hyperlipemia 02/09/2015   Controlled  . Depression   . DLBCL (diffuse large B cell lymphoma) (HCC) 08/15/2015  . Essential (primary) hypertension 02/09/2015  . Gastro-esophageal reflux disease without esophagitis 02/09/2015   Controlled  . Neuromuscular disorder (HCC)   . Tinnitus     Current Outpatient Prescriptions on File Prior to Visit  Medication Sig Dispense Refill  . ALPRAZolam (XANAX) 0.5 MG tablet Take 1 tablet (0.5 mg total) by mouth 2 (two) times daily as needed for anxiety. 60 tablet 2  . amoxicillin (AMOXIL) 500 MG capsule Take 4 capsules (2,000 mg total) by mouth as directed. ONE HOUR PRIOR TO DENTAL PROCEDURES 4 capsule 1  . bethanechol (URECHOLINE) 25 MG tablet TAKE 1 TABLET BY MOUTH THREE TIMES DAILY 90 tablet 2  . Cholecalciferol (VITAMIN D3) 2000 UNITS capsule Take 2,000 Units by mouth daily.    . doxazosin (CARDURA) 2 MG tablet TK 1 T PO  BID 180 tablet 1  . gabapentin (NEURONTIN) 300 MG capsule TAKE 3 CAPSULES BY MOUTH THREE TIMES DAILY, PLUS 2 CAPSULES EVERY DAY AS NEEDED 540 capsule 1  . HYDROcodone-acetaminophen (NORCO) 7.5-325 MG tablet Take 1 tablet by mouth 3 (three) times daily. 90 tablet 0  . niacin 500 MG tablet Take 500 mg by mouth at bedtime.    . Omega-3 Fatty Acids (OMEGA-3 FISH OIL PO) Take 2 each by mouth every morning. EPA/DHA 520/350    . polyethylene glycol  (MIRALAX / GLYCOLAX) packet Take 17 g by mouth daily as needed.    . psyllium (METAMUCIL) 58.6 % powder Take 1 packet by mouth daily.    . S-Adenosylmethionine (SAM-E) 400 MG TABS Take 400 mg by mouth daily.     No current facility-administered medications on file prior to visit.     Allergies  Allergen Reactions  . Nsaids Other (See Comments) and Tinitus    Bruising   . Ciprofloxacin     Other reaction(s): Other (See Comments) Achilles tendon  . Statins     Other reaction(s): Other (See Comments) Muscle pain  . Requip [Ropinirole Hcl] Anxiety  . Ropinirole Anxiety    Mood swing    Family History  Problem Relation Age of Onset  . Anuerysm Mother 85    Deceased  . Colon cancer Father 73    Deceased  . Colon cancer Brother   . Prostate cancer Brother     Deceased  . Alzheimer's disease Brother   . Heart disease Brother   . Alzheimer's disease Paternal Grandmother   . Early death Maternal Grandmother     Unknown  . Diabetes Neg Hx   . Obesity Son   . Obesity Son     Gastric Bypass  . Healthy Son   . Healthy Daughter     Social History   Social History  . Marital status: Widowed    Spouse name: N/A  .   Number of children: N/A  . Years of education: N/A   Social History Main Topics  . Smoking status: Former Research scientist (life sciences)  . Smokeless tobacco: Not on file     Comment: Quit >50 years ago  . Alcohol use Not on file  . Drug use: Unknown  . Sexual activity: Not on file   Other Topics Concern  . Not on file   Social History Narrative  . No narrative on file    Review of Systems - See HPI.  All other ROS are negative.  There were no vitals taken for this visit.  Physical Exam  Recent Results (from the past 2160 hour(s))  CBC with Differential [facility])     Status: Abnormal   Collection Time: 11/30/15 10:33 AM  Result Value Ref Range   WBC 5.1 4.0 - 10.0 10e3/uL   RBC 3.25 (L) 4.20 - 5.70 10e6/uL   HGB 11.3 (L) 13.0 - 17.1 g/dL   HCT 33.5 (L) 38.7 -  49.9 %   MCV 103 (H) 82 - 98 fL   MCH 34.8 (H) 28.0 - 33.4 pg   MCHC 33.7 32.0 - 35.9 g/dL   RDW 13.3 11.1 - 15.7 %   Platelets 128 (L) 145 - 400 10e3/uL   NEUT# 1.4 (L) 1.5 - 6.5 10e3/uL   LYMPH# 1.6 0.9 - 3.3 10e3/uL   MONO# 1.8 (H) 0.1 - 0.9 10e3/uL   Eosinophils Absolute 0.1 0.0 - 0.5 10e3/uL   BASO# 0.1 0.0 - 0.2 10e3/uL   NEUT% 28.1 (L) 40.0 - 80.0 %   LYMPH% 32.3 14.0 - 48.0 %   MONO% 36.2 (H) 0.0 - 13.0 %   EOS% 1.6 0.0 - 7.0 %   BASO% 1.8 0.0 - 2.0 %  [facility] - Smear     Status: None   Collection Time: 11/30/15 10:33 AM  Result Value Ref Range   Smear Result Smear Available   Ferritin     Status: None   Collection Time: 11/30/15 10:34 AM  Result Value Ref Range   Ferritin 188 22 - 316 ng/ml  Iron and TIBC     Status: None   Collection Time: 11/30/15 10:34 AM  Result Value Ref Range   Iron 85 42 - 163 ug/dL   TIBC 219 202 - 409 ug/dL   UIBC 134 117 - 376 ug/dL   %SAT 39 20 - 55 %  Reticulocytes     Status: None   Collection Time: 11/30/15 10:34 AM  Result Value Ref Range   Reticulocyte Count 1.2 0.6 - 2.6 %  Comprehensive metabolic panel     Status: Abnormal   Collection Time: 11/30/15 10:34 AM  Result Value Ref Range   Sodium 140 136 - 145 mEq/L   Potassium 4.2 3.5 - 5.1 mEq/L   Chloride 106 98 - 109 mEq/L   CO2 26 22 - 29 mEq/L   Glucose 86 70 - 140 mg/dl    Comment: Glucose reference range is for nonfasting patients. Fasting glucose reference range is 70- 100.   BUN 24.6 7.0 - 26.0 mg/dL   Creatinine 1.3 0.7 - 1.3 mg/dL   Total Bilirubin 0.58 0.20 - 1.20 mg/dL   Alkaline Phosphatase 51 40 - 150 U/L   AST 19 5 - 34 U/L   ALT 13 0 - 55 U/L   Total Protein 7.2 6.4 - 8.3 g/dL   Albumin 3.7 3.5 - 5.0 g/dL   Calcium 9.2 8.4 - 10.4 mg/dL   Anion  Gap 8 3 - 11 mEq/L   EGFR 48 (L) >90 ml/min/1.73 m2    Comment: eGFR is calculated using the CKD-EPI Creatinine Equation (2009)    Assessment/Plan: No problem-specific Assessment & Plan notes found for  this encounter.    Leeanne Rio, PA-C

## 2016-02-08 ENCOUNTER — Encounter: Payer: Self-pay | Admitting: Physician Assistant

## 2016-02-08 ENCOUNTER — Ambulatory Visit (INDEPENDENT_AMBULATORY_CARE_PROVIDER_SITE_OTHER): Payer: Medicare Other | Admitting: Physician Assistant

## 2016-02-08 VITALS — BP 122/62 | HR 67 | Temp 98.3°F | Resp 16 | Ht 69.0 in | Wt 219.0 lb

## 2016-02-08 DIAGNOSIS — F411 Generalized anxiety disorder: Secondary | ICD-10-CM | POA: Diagnosis not present

## 2016-02-08 DIAGNOSIS — G894 Chronic pain syndrome: Secondary | ICD-10-CM | POA: Diagnosis not present

## 2016-02-08 DIAGNOSIS — I10 Essential (primary) hypertension: Secondary | ICD-10-CM

## 2016-02-08 MED ORDER — ALPRAZOLAM 0.5 MG PO TABS: 0.5000 mg | ORAL_TABLET | Freq: Three times a day (TID) | ORAL | 2 refills | Status: DC | PRN

## 2016-02-08 NOTE — Progress Notes (Signed)
Patient presents to clinic today for follow-up of BP and pain secondary to OA.   Patient currently on a regimen of Cardrua 2 mg taking twice daily as directed. Patient denies chest pain, palpitations, lightheadedness, dizziness, vision changes or frequent headaches.  BP Readings from Last 3 Encounters:  02/08/16 122/62  11/30/15 126/63  09/26/15 (!) 148/60   Patient is needing refill of pain medication. Endorses the medication has tremendously helped with chronic pain. Is able to enjoy his days. Is currently in PT at [facility] where he resides. States it is going very well.  Past Medical History:  Diagnosis Date  . Anemia   . Anemia due to antineoplastic chemotherapy 08/15/2015  . Anxiety   . Arthritis   . Cancer ([place])   . Cataract   . Chronic kidney disease (CKD), stage III (moderate) 02/09/2015   Controlled  . Combined fat and carbohydrate induced hyperlipemia 02/09/2015   Controlled  . Depression   . DLBCL (diffuse large B cell lymphoma) ([place]) 08/15/2015  . Essential (primary) hypertension 02/09/2015  . Gastro-esophageal reflux disease without esophagitis 02/09/2015   Controlled  . Neuromuscular disorder ([place])   . Tinnitus     Current Outpatient Prescriptions on File Prior to Visit  Medication Sig Dispense Refill  . amoxicillin (AMOXIL) 500 MG capsule Take 4 capsules (2,000 mg total) by mouth as directed. ONE HOUR PRIOR TO DENTAL PROCEDURES 4 capsule 1  . bethanechol (URECHOLINE) 25 MG tablet TAKE 1 TABLET BY MOUTH THREE TIMES DAILY 90 tablet 2  . Cholecalciferol (VITAMIN D3) 2000 UNITS capsule Take 2,000 Units by mouth daily.    Marland Kitchen doxazosin (CARDURA) 2 MG tablet TK 1 T PO  BID 180 tablet 1  . gabapentin (NEURONTIN) 300 MG capsule TAKE 3 CAPSULES BY MOUTH THREE TIMES DAILY, PLUS 2 CAPSULES EVERY DAY AS NEEDED 540 capsule 1  . HYDROcodone-acetaminophen (NORCO) 7.5-325 MG tablet Take 1 tablet by mouth 3 (three) times daily. 90 tablet 0  . niacin 500 MG tablet Take 500 mg by  mouth at bedtime.    . Omega-3 Fatty Acids (OMEGA-3 FISH OIL PO) Take 2 each by mouth every morning. EPA/DHA 520/350    . polyethylene glycol (MIRALAX / GLYCOLAX) packet Take 17 g by mouth daily as needed.    . psyllium (METAMUCIL) 58.6 % powder Take 1 packet by mouth daily. 1 Teaspoon daily    . S-Adenosylmethionine (SAM-E) 400 MG TABS Take 400 mg by mouth daily.     No current facility-administered medications on file prior to visit.     Allergies  Allergen Reactions  . Nsaids Other (See Comments) and Tinitus    Bruising   . Ciprofloxacin     Other reaction(s): Other (See Comments) Achilles tendon  . Statins     Other reaction(s): Other (See Comments) Muscle pain  . Requip [Ropinirole Hcl] Anxiety  . Ropinirole Anxiety    Mood swing    Family History  Problem Relation Age of Onset  . Anuerysm Mother 30    Deceased  . Colon cancer Father 59    Deceased  . Colon cancer Brother   . Prostate cancer Brother     Deceased  . Alzheimer's disease Brother   . Heart disease Brother   . Alzheimer's disease Paternal Grandmother   . Early death Maternal Grandmother     Unknown  . Diabetes Neg Hx   . Obesity Son   . Obesity Son     Gastric Bypass  . Healthy  Son   . Healthy Daughter     Social History   Social History  . Marital status: Widowed    Spouse name: N/A  . Number of children: N/A  . Years of education: N/A   Social History Main Topics  . Smoking status: Former Research scientist (life sciences)  . Smokeless tobacco: None     Comment: Quit >50 years ago  . Alcohol use None  . Drug use: Unknown  . Sexual activity: Not Asked   Other Topics Concern  . None   Social History Narrative  . None    Review of Systems - See HPI.  All other ROS are negative.  BP 122/62 (BP Location: Left Arm, Patient Position: Sitting, Cuff Size: Large)   Pulse 67   Temp 98.3 F (36.8 C) (Oral)   Resp 16   Ht '5\' 9"'$  (1.753 m)   Wt 219 lb (99.3 kg)   SpO2 96%   BMI 32.34 kg/m   Physical Exam    Constitutional: He is oriented to person, place, and time and well-developed, well-nourished, and in no distress.  Eyes: Conjunctivae are normal.  Cardiovascular: Normal rate, regular rhythm, normal heart sounds and intact distal pulses.   Pulmonary/Chest: Effort normal and breath sounds normal. No respiratory distress. He has no wheezes. He has no rales. He exhibits no tenderness.  Neurological: He is alert and oriented to person, place, and time.  Skin: Skin is warm and dry. No rash noted.  Psychiatric: Affect normal.  Vitals reviewed.   Recent Results (from the past 2160 hour(s))  CBC with Differential [facility])     Status: Abnormal   Collection Time: 11/30/15 10:33 AM  Result Value Ref Range   WBC 5.1 4.0 - 10.0 10e3/uL   RBC 3.25 (L) 4.20 - 5.70 10e6/uL   HGB 11.3 (L) 13.0 - 17.1 g/dL   HCT 33.5 (L) 38.7 - 49.9 %   MCV 103 (H) 82 - 98 fL   MCH 34.8 (H) 28.0 - 33.4 pg   MCHC 33.7 32.0 - 35.9 g/dL   RDW 13.3 11.1 - 15.7 %   Platelets 128 (L) 145 - 400 10e3/uL   NEUT# 1.4 (L) 1.5 - 6.5 10e3/uL   LYMPH# 1.6 0.9 - 3.3 10e3/uL   MONO# 1.8 (H) 0.1 - 0.9 10e3/uL   Eosinophils Absolute 0.1 0.0 - 0.5 10e3/uL   BASO# 0.1 0.0 - 0.2 10e3/uL   NEUT% 28.1 (L) 40.0 - 80.0 %   LYMPH% 32.3 14.0 - 48.0 %   MONO% 36.2 (H) 0.0 - 13.0 %   EOS% 1.6 0.0 - 7.0 %   BASO% 1.8 0.0 - 2.0 %  [facility] - Smear     Status: None   Collection Time: 11/30/15 10:33 AM  Result Value Ref Range   Smear Result Smear Available   Ferritin     Status: None   Collection Time: 11/30/15 10:34 AM  Result Value Ref Range   Ferritin 188 22 - 316 ng/ml  Iron and TIBC     Status: None   Collection Time: 11/30/15 10:34 AM  Result Value Ref Range   Iron 85 42 - 163 ug/dL   TIBC 219 202 - 409 ug/dL   UIBC 134 117 - 376 ug/dL   %SAT 39 20 - 55 %  Reticulocytes     Status: None   Collection Time: 11/30/15 10:34 AM  Result Value Ref Range   Reticulocyte Count 1.2 0.6 - 2.6 %  Comprehensive metabolic  panel  Status: Abnormal   Collection Time: 11/30/15 10:34 AM  Result Value Ref Range   Sodium 140 136 - 145 mEq/L   Potassium 4.2 3.5 - 5.1 mEq/L   Chloride 106 98 - 109 mEq/L   CO2 26 22 - 29 mEq/L   Glucose 86 70 - 140 mg/dl    Comment: Glucose reference range is for nonfasting patients. Fasting glucose reference range is 70- 100.   BUN 24.6 7.0 - 26.0 mg/dL   Creatinine 1.3 0.7 - 1.3 mg/dL   Total Bilirubin 0.58 0.20 - 1.20 mg/dL   Alkaline Phosphatase 51 40 - 150 U/L   AST 19 5 - 34 U/L   ALT 13 0 - 55 U/L   Total Protein 7.2 6.4 - 8.3 g/dL   Albumin 3.7 3.5 - 5.0 g/dL   Calcium 9.2 8.4 - 10.4 mg/dL   Anion Gap 8 3 - 11 mEq/L   EGFR 48 (L) >90 ml/min/1.73 m2    Comment: eGFR is calculated using the CKD-EPI Creatinine Equation (2009)    Assessment/Plan: Essential (primary) hypertension Stable. Continue current medication regimen.   Chronic pain syndrome Doing very well. CSC on file. Continue current regimen. Continue PT as ALF. FU scheduled.  Anxiety, generalized Medications refilled today.    Leeanne Rio, PA-C

## 2016-02-08 NOTE — Patient Instructions (Signed)
Please continue medications as directed. I have given extra tablets for Xanax in case there are days when you need the third tablet. Let me know how you are doing.  Try starting an OTC Saw [place]metto 450 mg supplement daily to help with prostate health.  I will see you at your physical.

## 2016-02-10 NOTE — Assessment & Plan Note (Signed)
Medications refilled today

## 2016-02-10 NOTE — Assessment & Plan Note (Signed)
-  Stable. Continue current medication regimen. 

## 2016-02-10 NOTE — Assessment & Plan Note (Signed)
Doing very well. CSC on file. Continue current regimen. Continue PT as ALF. FU scheduled.

## 2016-02-17 DIAGNOSIS — Z08 Encounter for follow-up examination after completed treatment for malignant neoplasm: Secondary | ICD-10-CM | POA: Diagnosis not present

## 2016-02-17 DIAGNOSIS — L578 Other skin changes due to chronic exposure to nonionizing radiation: Secondary | ICD-10-CM | POA: Diagnosis not present

## 2016-02-17 DIAGNOSIS — L570 Actinic keratosis: Secondary | ICD-10-CM | POA: Diagnosis not present

## 2016-02-17 DIAGNOSIS — Z85828 Personal history of other malignant neoplasm of skin: Secondary | ICD-10-CM | POA: Diagnosis not present

## 2016-02-19 ENCOUNTER — Other Ambulatory Visit: Payer: Self-pay | Admitting: Family Medicine

## 2016-02-19 DIAGNOSIS — G894 Chronic pain syndrome: Secondary | ICD-10-CM

## 2016-02-20 MED ORDER — HYDROCODONE-ACETAMINOPHEN 7.5-325 MG PO TABS: 1.0000 | ORAL_TABLET | Freq: Three times a day (TID) | ORAL | 0 refills | Status: DC

## 2016-03-12 ENCOUNTER — Encounter: Payer: Self-pay | Admitting: Physician Assistant

## 2016-03-13 MED ORDER — ALPRAZOLAM 0.5 MG PO TABS: 0.5000 mg | ORAL_TABLET | Freq: Three times a day (TID) | ORAL | 1 refills | Status: DC | PRN

## 2016-03-15 ENCOUNTER — Other Ambulatory Visit: Payer: Self-pay | Admitting: Physician Assistant

## 2016-03-15 ENCOUNTER — Encounter: Payer: Self-pay | Admitting: Physician Assistant

## 2016-03-15 DIAGNOSIS — G894 Chronic pain syndrome: Secondary | ICD-10-CM

## 2016-03-16 ENCOUNTER — Other Ambulatory Visit: Payer: Self-pay | Admitting: Physician Assistant

## 2016-03-16 DIAGNOSIS — G894 Chronic pain syndrome: Secondary | ICD-10-CM

## 2016-03-16 MED ORDER — HYDROCODONE-ACETAMINOPHEN 7.5-325 MG PO TABS: 1.0000 | ORAL_TABLET | Freq: Three times a day (TID) | ORAL | 0 refills | Status: DC

## 2016-03-24 ENCOUNTER — Other Ambulatory Visit: Payer: Self-pay | Admitting: Physician Assistant

## 2016-03-25 ENCOUNTER — Encounter: Payer: Self-pay | Admitting: Physician Assistant

## 2016-03-25 MED ORDER — GABAPENTIN 300 MG PO CAPS: ORAL_CAPSULE | ORAL | 1 refills | Status: DC

## 2016-03-26 ENCOUNTER — Other Ambulatory Visit: Payer: Self-pay | Admitting: Physician Assistant

## 2016-03-26 NOTE — Telephone Encounter (Signed)
90-day supply sent to pharmacy per provider VO/SLS 10/02

## 2016-03-29 ENCOUNTER — Other Ambulatory Visit: Payer: Self-pay | Admitting: Physician Assistant

## 2016-03-30 ENCOUNTER — Telehealth: Payer: Self-pay | Admitting: Physician Assistant

## 2016-03-30 MED ORDER — GABAPENTIN 300 MG PO CAPS: ORAL_CAPSULE | ORAL | 0 refills | Status: DC

## 2016-03-30 NOTE — Telephone Encounter (Signed)
Rx request Denied; last Rx to pharmacy 03/26/16, #990 dispense; per pharmacy pt has p/u Rx today/SLS 10/06

## 2016-03-30 NOTE — Telephone Encounter (Signed)
Spoke with patient, he is waiting for Mail Order pharmacy shipment, informed of the [place] me yesterday via phone call that pt had p/u medication yesterday; pt insisted that this is not the case, as he reports he has not p/u any Rx and will be out of medication tonight. Informed him that I would call the pharmacy again and make sure that his medication will be ready for p/u this afternoon and to check with his [place], pt understood & agreed. Spoke with [facility] once more and was informed that patient did not p/u medication after being told differently yesterday morning. I have sent a short term supply to patient's local [facility] until he can receive his mail order shipoment./SLS 10/06

## 2016-03-30 NOTE — Telephone Encounter (Signed)
Relation to RC:BULA Call back number:[phone] Pharmacy: [facility] [place], [place] - [address] AT [place] [phone] (Phone) [phone] (Fax)     Reason for call:  Patient awaiting gabapentin (NEURONTIN) 300 MG capsule mail order and will be going out of town tomorrow. Patient requesting a week worth to hold him over until mail order is received from [place].please advise

## 2016-03-30 NOTE — Telephone Encounter (Addendum)
Relation to NU:UVOZ Call back number:[phone]   Reason for call:  Patient received mail order today and states "this is a mess" patient received 27 pills in the mail  and states he will run out. Patient requesting 360 pills of gabapentin (NEURONTIN) 300 MG capsule. Please advise

## 2016-03-30 NOTE — Telephone Encounter (Signed)
Provider has spoken to patient and to [facility]; pharmacy did short the patient on his shipment and only shipped #27 pills. [facility] mail order will be shipping out the correct #990 with [1] refill per provider VO; they will reach out to patient to inform him of this/SLS 10/06

## 2016-04-06 ENCOUNTER — Ambulatory Visit (HOSPITAL_BASED_OUTPATIENT_CLINIC_OR_DEPARTMENT_OTHER): Payer: Medicare Other | Admitting: Hematology & Oncology

## 2016-04-06 ENCOUNTER — Other Ambulatory Visit (HOSPITAL_BASED_OUTPATIENT_CLINIC_OR_DEPARTMENT_OTHER): Payer: Medicare Other

## 2016-04-06 ENCOUNTER — Encounter: Payer: Self-pay | Admitting: Hematology & Oncology

## 2016-04-06 VITALS — BP 127/59 | HR 72 | Temp 98.0°F | Resp 18 | Ht 69.0 in | Wt 221.0 lb

## 2016-04-06 DIAGNOSIS — D6481 Anemia due to antineoplastic chemotherapy: Secondary | ICD-10-CM

## 2016-04-06 DIAGNOSIS — C8333 Diffuse large B-cell lymphoma, intra-abdominal lymph nodes: Secondary | ICD-10-CM

## 2016-04-06 DIAGNOSIS — T451X5A Adverse effect of antineoplastic and immunosuppressive drugs, initial encounter: Principal | ICD-10-CM

## 2016-04-06 DIAGNOSIS — Z8572 Personal history of non-Hodgkin lymphomas: Secondary | ICD-10-CM

## 2016-04-06 DIAGNOSIS — C8330 Diffuse large B-cell lymphoma, unspecified site: Secondary | ICD-10-CM | POA: Diagnosis not present

## 2016-04-06 DIAGNOSIS — D539 Nutritional anemia, unspecified: Secondary | ICD-10-CM

## 2016-04-06 LAB — COMPREHENSIVE METABOLIC PANEL
ALT: 10 U/L (ref 0–55)
AST: 17 U/L (ref 5–34)
Albumin: 3.7 g/dL (ref 3.5–5.0)
Alkaline Phosphatase: 60 U/L (ref 40–150)
Anion Gap: 8 mEq/L (ref 3–11)
BUN: 27.7 mg/dL — AB (ref 7.0–26.0)
CHLORIDE: 106 meq/L (ref 98–109)
CO2: 27 meq/L (ref 22–29)
Calcium: 9.4 mg/dL (ref 8.4–10.4)
Creatinine: 1.4 mg/dL — ABNORMAL HIGH (ref 0.7–1.3)
EGFR: 45 mL/min/{1.73_m2} — AB (ref >90)
GLUCOSE: 95 mg/dL (ref 70–140)
Potassium: 4.7 mEq/L (ref 3.5–5.1)
SODIUM: 141 meq/L (ref 136–145)
Total Bilirubin: 0.74 mg/dL (ref 0.20–1.20)
Total Protein: 7.4 g/dL (ref 6.4–8.3)

## 2016-04-06 LAB — CBC WITH DIFFERENTIAL (CANCER CENTER ONLY)
BASO#: 0.1 10*3/uL (ref 0.0–0.2)
BASO%: 2 % (ref 0.0–2.0)
EOS ABS: 0.1 10*3/uL (ref 0.0–0.5)
EOS%: 1.4 % (ref 0.0–7.0)
HEMATOCRIT: 34.1 % — AB (ref 38.7–49.9)
HEMOGLOBIN: 11.6 g/dL — AB (ref 13.0–17.1)
LYMPH#: 1.6 10*3/uL (ref 0.9–3.3)
LYMPH%: 32.3 % (ref 14.0–48.0)
MCH: 34.7 pg — AB (ref 28.0–33.4)
MCHC: 34 g/dL (ref 32.0–35.9)
MCV: 102 fL — AB (ref 82–98)
MONO#: 1.7 10*3/uL — AB (ref 0.1–0.9)
MONO%: 34.7 % — ABNORMAL HIGH (ref 0.0–13.0)
NEUT%: 29.6 % — ABNORMAL LOW (ref 40.0–80.0)
NEUTROS ABS: 1.5 10*3/uL (ref 1.5–6.5)
Platelets: 132 10*3/uL — ABNORMAL LOW (ref 145–400)
RBC: 3.34 10*6/uL — ABNORMAL LOW (ref 4.20–5.70)
RDW: 13.7 % (ref 11.1–15.7)
WBC: 4.9 10*3/uL (ref 4.0–10.0)

## 2016-04-06 LAB — IRON AND TIBC
%SAT: 39 % (ref 20–55)
IRON: 90 ug/dL (ref 42–163)
TIBC: 228 ug/dL (ref 202–409)
UIBC: 138 ug/dL (ref 117–376)

## 2016-04-06 LAB — FERRITIN: Ferritin: 188 ng/ml (ref 22–316)

## 2016-04-06 LAB — CHCC SATELLITE - SMEAR

## 2016-04-06 NOTE — Progress Notes (Signed)
Hematology and Oncology Follow Up Visit  DONNAVIN VANDENBRINK [id number] Jan 13, 1928 82 y.o. 04/06/2016   Principle Diagnosis:   History of diffuse large cell non-Hodgkin's lymphoma-treated in [place]  Macrocytic anemia  Current Therapy:    Observation     Interim History:  Mr. Casebolt is back for follow-up. He had a good summer. He was up in [place]. He has a house up in this again. He and his family were updated.  He does feel quite tired. He is trying to exercise. He is eating okay. He's had no nausea or vomiting. 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  He is sad that his family doctor is leaving. He says he needs a nephrologist. I will see what I can do to help him out.  We last saw him in June, his ferritin was 188 with an iron saturation of 39%.  His appetite is okay.  He's had no cough or shortness of breath. He's had some leg swelling. He has had no rashes. He did have some diarrhea up in [place]. This seemed to be some type of infectious diarrhea as other family members have the same thing.  Overall, his performance status is ECOG 1.  Medications:  Current Outpatient Prescriptions:  .  ALPRAZolam (XANAX) 0.5 MG tablet, Take 1 tablet (0.5 mg total) by mouth 3 (three) times daily as needed for anxiety., Disp: 90 tablet, Rfl: 1 .  amoxicillin (AMOXIL) 500 MG capsule, Take 4 capsules (2,000 mg total) by mouth as directed. ONE HOUR PRIOR TO DENTAL PROCEDURES, Disp: 4 capsule, Rfl: 1 .  bethanechol (URECHOLINE) 25 MG tablet, TAKE 1 TABLET BY MOUTH THREE TIMES DAILY, Disp: 90 tablet, Rfl: 2 .  CALCIUM-MAGNESIUM-ZINC PO, Take by mouth., Disp: , Rfl:  .  Cholecalciferol (VITAMIN D3) 2000 UNITS capsule, Take 2,000 Units by mouth daily., Disp: , Rfl:  .  doxazosin (CARDURA) 2 MG tablet, TK 1 T PO  BID, Disp: 180 tablet, Rfl: 1 .  gabapentin (NEURONTIN) 300 MG capsule, TAKE 3 CAPSULES BY MOUTH THREE TIMES DAILY, PLUS 2 CAPSULES EVERY DAY AS NEEDED, Disp: 80 capsule, Rfl:  0 .  HYDROcodone-acetaminophen (NORCO) 7.5-325 MG tablet, Take 1 tablet by mouth 3 (three) times daily., Disp: 90 tablet, Rfl: 0 .  magnesium citrate SOLN, Take 1 Bottle by mouth once., Disp: , Rfl:  .  niacin 500 MG tablet, Take 500 mg by mouth at bedtime., Disp: , Rfl:  .  Omega-3 Fatty Acids (OMEGA-3 FISH OIL PO), Take 2 each by mouth every morning. EPA/DHA 520/350, Disp: , Rfl:  .  polyethylene glycol (MIRALAX / GLYCOLAX) packet, Take 17 g by mouth daily as needed., Disp: , Rfl:  .  psyllium (METAMUCIL) 58.6 % powder, Take 1 packet by mouth daily. 1 Teaspoon daily, Disp: , Rfl:  .  S-Adenosylmethionine (SAM-E) 400 MG TABS, Take 400 mg by mouth daily., Disp: , Rfl:   Allergies:  Allergies  Allergen Reactions  . Nsaids Other (See Comments) and Tinitus    Bruising   . Ciprofloxacin     Other reaction(s): Other (See Comments) Achilles tendon  . Statins     Other reaction(s): Other (See Comments) Muscle pain  . Requip [Ropinirole Hcl] Anxiety  . Ropinirole Anxiety    Mood swing    Past Medical History, Surgical history, Social history, and Family History were reviewed and updated.  Review of Systems: As above  Physical Exam:  height is 5\' 9"  (1.753 m) and weight is 221 lb (100.2 kg).  His oral temperature is 98 F (36.7 C). His blood pressure is 127/59 (abnormal) and his pulse is 72. His respiration is 18.   Wt Readings from Last 3 Encounters:  04/06/16 221 lb (100.2 kg)  02/08/16 219 lb (99.3 kg)  11/30/15 220 lb (99.8 kg)     Head and neck exam shows no ocular or oral lesions. He has no palpable cervical or supraclavicular lymph nodes. Lungs are clear. Cardiac exam regular rate and rhythm with no murmurs, rubs or bruits. Abdomen is soft. He has good bowel sounds. There is no fluid wave. There is no palpable liver or spleen tip. Axillary exam shows no bilateral axillary adenopathy. Back exam shows no tenderness over the spine, ribs or hips. Extremities shows no clubbing,  cyanosis or edema. He has good range of motion of his joints. He does have some osteoarthritic changes in his joints. Skin exam shows no rashes, ecchymoses or petechia. Neurological exam shows no focal neurological deficits.  Lab Results  Component Value Date   WBC 4.9 04/06/2016   HGB 11.6 (L) 04/06/2016   HCT 34.1 (L) 04/06/2016   MCV 102 (H) 04/06/2016   PLT 132 (L) 04/06/2016     Chemistry      Component Value Date/Time   NA 140 11/30/2015 1034   K 4.2 11/30/2015 1034   CO2 26 11/30/2015 1034   BUN 24.6 11/30/2015 1034   CREATININE 1.3 11/30/2015 1034   GLU 90 11/15/2014      Component Value Date/Time   CALCIUM 9.2 11/30/2015 1034   ALKPHOS 51 11/30/2015 1034   AST 19 11/30/2015 1034   ALT 13 11/30/2015 1034   BILITOT 0.58 11/30/2015 1034         Impression and Plan: Mr. Appling is a 82 year old white male. He had a history of diffuse large cell non-Hodgkin's lymphoma. He was treated  with 6 cycles of R-CHOP. He got this treatment up in [place]. He completed this back in April 2014.   His blood counts really are holding pretty steady. He is feeling more tired. I'm not sure as to why this would be.  I would has blood smear. I do not see the blood smear that looked suspicious. I do not see anything that looked like transformation over to an leukemic process.  He needs to have a nephrologist. His family doctor is leaving. As such, he wants [place] to make a referral for him. I will see what I can do to help him out.   I would like to see him back in about 4 or 5 weeks. I want to see him back before the holidays so that we can optimize his blood so he will feel well over the holidays.   I spent  30 minutes with him today.     [name], MD 10/13/201712:55 PM

## 2016-04-07 LAB — RETICULOCYTES: Reticulocyte Count: 1.4 % (ref 0.6–2.6)

## 2016-04-11 ENCOUNTER — Encounter: Payer: Self-pay | Admitting: Physician Assistant

## 2016-04-15 ENCOUNTER — Other Ambulatory Visit: Payer: Self-pay | Admitting: Physician Assistant

## 2016-04-16 ENCOUNTER — Encounter: Payer: Self-pay | Admitting: Physician Assistant

## 2016-04-16 ENCOUNTER — Telehealth: Payer: Self-pay | Admitting: Physician Assistant

## 2016-04-16 DIAGNOSIS — G894 Chronic pain syndrome: Secondary | ICD-10-CM

## 2016-04-16 NOTE — Telephone Encounter (Signed)
Refill sent per [facility] refill protocol/SLS  

## 2016-04-17 NOTE — Telephone Encounter (Signed)
Pt is requesting refill on Hydrocodone-acetaminophen. Please advise.

## 2016-04-18 MED ORDER — HYDROCODONE-ACETAMINOPHEN 7.5-325 MG PO TABS: 1.0000 | ORAL_TABLET | Freq: Three times a day (TID) | ORAL | 0 refills | Status: DC

## 2016-04-18 NOTE — Telephone Encounter (Signed)
Refill is ready for pickup 

## 2016-04-23 ENCOUNTER — Ambulatory Visit (INDEPENDENT_AMBULATORY_CARE_PROVIDER_SITE_OTHER): Payer: Medicare Other | Admitting: Physician Assistant

## 2016-04-23 ENCOUNTER — Ambulatory Visit (HOSPITAL_BASED_OUTPATIENT_CLINIC_OR_DEPARTMENT_OTHER)
Admission: RE | Admit: 2016-04-23 | Discharge: 2016-04-23 | Disposition: A | Discharge status: Home / Self Care | Payer: Medicare Other | Source: Ambulatory Visit | Attending: Physician Assistant | Admitting: Physician Assistant

## 2016-04-23 ENCOUNTER — Encounter: Payer: Self-pay | Admitting: Physician Assistant

## 2016-04-23 VITALS — BP 124/62 | HR 73 | Temp 97.8°F | Resp 16 | Ht 69.0 in | Wt 223.1 lb

## 2016-04-23 DIAGNOSIS — M545 Low back pain, unspecified: Secondary | ICD-10-CM

## 2016-04-23 DIAGNOSIS — M5136 Other intervertebral disc degeneration, lumbar region: Secondary | ICD-10-CM | POA: Insufficient documentation

## 2016-04-23 DIAGNOSIS — I700 Atherosclerosis of aorta: Secondary | ICD-10-CM | POA: Insufficient documentation

## 2016-04-23 MED ORDER — DIAZEPAM 5 MG PO TABS: 5.0000 mg | ORAL_TABLET | Freq: Two times a day (BID) | ORAL | 1 refills | Status: DC | PRN

## 2016-04-23 NOTE — Progress Notes (Signed)
Patient with history of lumbar DJD and spondylolisthesis of L4 on L5, chronic lumbar back pain, presents to clinic today c/o flare up of low back pain x 1 week. Endorses some radiation into RLE without numbness, weakness or tingling. Denies saddle anesthesia or change to bowel/bladder habits. Pain is 7/10 with ambulation. Improved with rest. Has taken chronic pain medication with some relief of symptoms. Last imaging 2010.   Past Medical History:  Diagnosis Date  . Anemia   . Anemia due to antineoplastic chemotherapy 08/15/2015  . Anxiety   . Arthritis   . Cancer ([place])   . Cataract   . Chronic kidney disease (CKD), stage III (moderate) 02/09/2015   Controlled  . Combined fat and carbohydrate induced hyperlipemia 02/09/2015   Controlled  . Depression   . DLBCL (diffuse large B cell lymphoma) ([place]) 08/15/2015  . Essential (primary) hypertension 02/09/2015  . Gastro-esophageal reflux disease without esophagitis 02/09/2015   Controlled  . Neuromuscular disorder ([place])   . Tinnitus     Current Outpatient Prescriptions on File Prior to Visit  Medication Sig Dispense Refill  . ALPRAZolam (XANAX) 0.5 MG tablet Take 1 tablet (0.5 mg total) by mouth 3 (three) times daily as needed for anxiety. 90 tablet 1  . amoxicillin (AMOXIL) 500 MG capsule Take 4 capsules (2,000 mg total) by mouth as directed. ONE HOUR PRIOR TO DENTAL PROCEDURES 4 capsule 1  . bethanechol (URECHOLINE) 25 MG tablet TAKE 1 TABLET BY MOUTH THREE TIMES DAILY 90 tablet 2  . Cholecalciferol (VITAMIN D3) 2000 UNITS capsule Take 2,000 Units by mouth daily.    Marland Kitchen doxazosin (CARDURA) 2 MG tablet TK 1 T PO  BID 180 tablet 1  . gabapentin (NEURONTIN) 300 MG capsule TAKE 3 CAPSULES BY MOUTH THREE TIMES DAILY, PLUS 2 CAPSULES EVERY DAY AS NEEDED 80 capsule 0  . HYDROcodone-acetaminophen (NORCO) 7.5-325 MG tablet Take 1 tablet by mouth 3 (three) times daily. 90 tablet 0  . niacin 500 MG tablet Take 500 mg by mouth at bedtime.    . Omega-3  Fatty Acids (OMEGA-3 FISH OIL PO) Take 2 each by mouth every morning. EPA/DHA 520/350    . polyethylene glycol (MIRALAX / GLYCOLAX) packet Take 17 g by mouth daily as needed.    . psyllium (METAMUCIL) 58.6 % powder Take 1 packet by mouth daily. 1 Teaspoon daily    . S-Adenosylmethionine (SAM-E) 400 MG TABS Take 400 mg by mouth daily.     No current facility-administered medications on file prior to visit.     Allergies  Allergen Reactions  . Nsaids Other (See Comments) and Tinitus    Bruising   . Ciprofloxacin     Other reaction(s): Other (See Comments) Achilles tendon  . Statins     Other reaction(s): Kidney issues Muscle pain  . Requip [Ropinirole Hcl] Anxiety  . Ropinirole Anxiety    Mood swing    Family History  Problem Relation Age of Onset  . Anuerysm Mother 71    Deceased  . Colon cancer Father 45    Deceased  . Colon cancer Brother   . Prostate cancer Brother     Deceased  . Alzheimer's disease Brother   . Heart disease Brother   . Alzheimer's disease Paternal Grandmother   . Early death Maternal Grandmother     Unknown  . Obesity Son   . Obesity Son     Gastric Bypass  . Healthy Son   . Healthy Daughter   . Diabetes  Neg Hx     Social History   Social History  . Marital status: Widowed    Spouse name: N/A  . Number of children: N/A  . Years of education: N/A   Social History Main Topics  . Smoking status: Former Research scientist (life sciences)  . Smokeless tobacco: Never Used     Comment: Quit >50 years ago  . Alcohol use None  . Drug use: Unknown  . Sexual activity: Not Asked   Other Topics Concern  . None   Social History Narrative  . None    Review of Systems - See HPI.  All other ROS are negative.  BP 124/62 (BP Location: Left Arm, Patient Position: Sitting, Cuff Size: Large)   Pulse 73   Temp 97.8 F (36.6 C) (Oral)   Resp 16   Ht '5\' 9"'  (1.753 m)   Wt 223 lb 2 oz (101.2 kg)   SpO2 96%   BMI 32.95 kg/m   Physical Exam  Constitutional: He is  oriented to person, place, and time and well-developed, well-nourished, and in no distress.  Cardiovascular: Normal rate, regular rhythm, normal heart sounds and intact distal pulses.   Pulmonary/Chest: Effort normal and breath sounds normal. No respiratory distress. He has no wheezes. He has no rales. He exhibits no tenderness.  Musculoskeletal:       Lumbar back: He exhibits pain and spasm. He exhibits no tenderness and no bony tenderness.  Neurological: He is alert and oriented to person, place, and time.  Skin: Skin is warm and dry. No rash noted.  Psychiatric: Affect normal.  Vitals reviewed.   Recent Results (from the past 2160 hour(s))  CBC with Differential [facility])     Status: Abnormal   Collection Time: 04/06/16 10:47 AM  Result Value Ref Range   WBC 4.9 4.0 - 10.0 10e3/uL   RBC 3.34 (L) 4.20 - 5.70 10e6/uL   HGB 11.6 (L) 13.0 - 17.1 g/dL   HCT 34.1 (L) 38.7 - 49.9 %   MCV 102 (H) 82 - 98 fL   MCH 34.7 (H) 28.0 - 33.4 pg   MCHC 34.0 32.0 - 35.9 g/dL   RDW 13.7 11.1 - 15.7 %   Platelets 132 (L) 145 - 400 10e3/uL   NEUT# 1.5 1.5 - 6.5 10e3/uL   LYMPH# 1.6 0.9 - 3.3 10e3/uL   MONO# 1.7 (H) 0.1 - 0.9 10e3/uL   Eosinophils Absolute 0.1 0.0 - 0.5 10e3/uL   BASO# 0.1 0.0 - 0.2 10e3/uL   NEUT% 29.6 (L) 40.0 - 80.0 %   LYMPH% 32.3 14.0 - 48.0 %   MONO% 34.7 (H) 0.0 - 13.0 %   EOS% 1.4 0.0 - 7.0 %   BASO% 2.0 0.0 - 2.0 %  Reticulocytes     Status: None   Collection Time: 04/06/16 10:47 AM  Result Value Ref Range   Reticulocyte Count 1.4 0.6 - 2.6 %  Ferritin     Status: None   Collection Time: 04/06/16 10:47 AM  Result Value Ref Range   Ferritin 188 22 - 316 ng/ml  [facility] - Smear     Status: None   Collection Time: 04/06/16 10:47 AM  Result Value Ref Range   Smear Result Smear Available   Iron and TIBC     Status: None   Collection Time: 04/06/16 10:47 AM  Result Value Ref Range   Iron 90 42 - 163 ug/dL   TIBC 228 202 - 409 ug/dL   UIBC 138 117 -  376  ug/dL   %SAT 39 20 - 55 %  Comprehensive metabolic panel     Status: Abnormal   Collection Time: 04/06/16 10:47 AM  Result Value Ref Range   Sodium 141 136 - 145 mEq/L   Potassium 4.7 3.5 - 5.1 mEq/L   Chloride 106 98 - 109 mEq/L   CO2 27 22 - 29 mEq/L   Glucose 95 70 - 140 mg/dl    Comment: Glucose reference range is for nonfasting patients. Fasting glucose reference range is 70- 100.   BUN 27.7 (H) 7.0 - 26.0 mg/dL   Creatinine 1.4 (H) 0.7 - 1.3 mg/dL   Total Bilirubin 0.74 0.20 - 1.20 mg/dL   Alkaline Phosphatase 60 40 - 150 U/L   AST 17 5 - 34 U/L   ALT 10 0 - 55 U/L   Total Protein 7.4 6.4 - 8.3 g/dL   Albumin 3.7 3.5 - 5.0 g/dL   Calcium 9.4 8.4 - 10.4 mg/dL   Anion Gap 8 3 - 11 mEq/L   EGFR 45 (L) >90 ml/min/1.73 m2    Comment: eGFR is calculated using the CKD-EPI Creatinine Equation (2009)    Assessment/Plan: 1. Acute left-sided low back pain without sciatica Will repeat imaging giving chronic issue. Continue hydrocodone as directed. Will stop Alprazolam for now. Substitute Diazepam to help with spasm while maintaining control of anxiety. Stretches reviewed. Will refer to Ortho for further management pending imaging. - DG Lumbar Spine Complete; Future - diazepam (VALIUM) 5 MG tablet; Take 1 tablet (5 mg total) by mouth every 12 (twelve) hours as needed for muscle spasms. Take instead of ALprazolam  Dispense: 10 tablet; Refill: 1   Leeanne Rio, [place]

## 2016-04-23 NOTE — Progress Notes (Signed)
Pre visit review using our clinic review tool, if applicable. No additional management support is needed unless otherwise documented below in the visit note/SLS  

## 2016-04-23 NOTE — Patient Instructions (Signed)
Please go downstairs for imaging. Please continue chronic pain regimen. Stop the Alprazolam for now. Take the Diazepam as directed. This will help with anxiety but will also help with muscle spasm. You will be contacted for assessment by Orthopedics.  If anything worsens before assessment, please return to the office. If any severe pain develops, radiation of pain, numbness or tingling or privates or extremities, please call 911 or have someone carry you to the ER.

## 2016-04-25 ENCOUNTER — Other Ambulatory Visit: Payer: Self-pay | Admitting: Physician Assistant

## 2016-04-25 DIAGNOSIS — M519 Unspecified thoracic, thoracolumbar and lumbosacral intervertebral disc disorder: Secondary | ICD-10-CM

## 2016-05-02 ENCOUNTER — Encounter: Payer: Self-pay | Admitting: Family Medicine

## 2016-05-02 ENCOUNTER — Ambulatory Visit (INDEPENDENT_AMBULATORY_CARE_PROVIDER_SITE_OTHER): Payer: Medicare Other | Admitting: Family Medicine

## 2016-05-02 VITALS — BP 133/68 | HR 66 | Temp 97.9°F | Resp 20 | Wt 215.5 lb

## 2016-05-02 DIAGNOSIS — M109 Gout, unspecified: Secondary | ICD-10-CM | POA: Diagnosis not present

## 2016-05-02 LAB — URIC ACID: URIC ACID, SERUM: 7.7 mg/dL (ref 4.0–7.8)

## 2016-05-02 MED ORDER — METHYLPREDNISOLONE ACETATE 80 MG/ML IJ SUSP
80.0000 mg | Freq: Once | INTRAMUSCULAR | Status: AC
  Administered 2016-05-02: 80 mg via INTRAMUSCULAR

## 2016-05-02 MED ORDER — PREDNISONE 20 MG PO TABS: ORAL_TABLET | ORAL | 0 refills | Status: DC

## 2016-05-02 NOTE — Progress Notes (Signed)
Jeremiah Walker , 1928-02-12, 82 y.o., male MRN: [id number] Patient Care Team    Relationship Specialty Notifications Start End  Brunetta Jeans, PA-C PCP - General Family Medicine  05/11/15     CC: Toe pain Subjective: Pt presents for an acute OV with complaints of left toe pain of 1 day duration.  Associated symptoms include mild redness. He denies swelling. He reports the pain is located from 1st metatarsal to toe. He reports he was sitting on his couch last night when the pain started. He had had similar symptoms on one occasion, but does not recall if he had gout or took medicine for it. He has not changed his diet or sustain trauma to the area. He does have a h/o of arthritis in multiple joints, including his right toe. He reports he does not like to take nsaids because it causes him to bruise easily. He takes daily narcotic for his chronic pain, and reports it was not touching his toe pain. He did take an aleve last night, which helped some. His current pain is a 2/10 and when walking the pain becomes worse. He also tried Aspercreme, which did not help at all. He has had chemotherapy in the past and suffers from neuropathy, but he states this pain is completely different. He denies fever, chills, nausea or signs of infection.    Allergies  Allergen Reactions  . Nsaids Other (See Comments) and Tinitus    Bruising   . Ciprofloxacin     Other reaction(s): Other (See Comments) Achilles tendon  . Statins     Other reaction(s): Kidney issues Muscle pain  . Requip [Ropinirole Hcl] Anxiety  . Ropinirole Anxiety    Mood swing   Social History  Substance Use Topics  . Smoking status: Former Research scientist (life sciences)  . Smokeless tobacco: Never Used     Comment: Quit >50 years ago  . Alcohol use 0.6 - 2.4 oz/week    1 - 4 Shots of liquor per week   Past Medical History:  Diagnosis Date  . Anemia   . Anemia due to antineoplastic chemotherapy 08/15/2015  . Anxiety   . Arthritis   . Cancer ([place])     . Cataract   . Chronic kidney disease (CKD), stage III (moderate) 02/09/2015   Controlled  . Combined fat and carbohydrate induced hyperlipemia 02/09/2015   Controlled  . Depression   . DLBCL (diffuse large B cell lymphoma) ([place]) 08/15/2015  . Essential (primary) hypertension 02/09/2015  . Gastro-esophageal reflux disease without esophagitis 02/09/2015   Controlled  . Neuromuscular disorder ([place])   . Tinnitus    Past Surgical History:  Procedure Laterality Date  . CATARACT EXTRACTION, BILATERAL    . COLONOSCOPY  May 2011  . CYSTOSCOPY    . TOTAL HIP ARTHROPLASTY  2003   Right   . TOTAL HIP ARTHROPLASTY  April, 2011   Left  . TRIGGER FINGER RELEASE  Nov 2013  . TURBINECTOMY  2006  . UPPER GI ENDOSCOPY  Nov 2015   Family History  Problem Relation Age of Onset  . Anuerysm Mother 93    Deceased  . Colon cancer Father 62    Deceased  . Colon cancer Brother   . Prostate cancer Brother     Deceased  . Alzheimer's disease Brother   . Heart disease Brother   . Alzheimer's disease Paternal Grandmother   . Early death Maternal Grandmother     Unknown  . Obesity Son   .  Obesity Son     Gastric Bypass  . Healthy Son   . Healthy Daughter   . Diabetes Neg Hx      Medication List       Accurate as of 05/02/16  1:38 PM. Always use your most recent med list.          ALPRAZolam 0.5 MG tablet Commonly known as:  XANAX Take 1 tablet (0.5 mg total) by mouth 3 (three) times daily as needed for anxiety.   amoxicillin 500 MG capsule Commonly known as:  AMOXIL Take 4 capsules (2,000 mg total) by mouth as directed. ONE HOUR PRIOR TO DENTAL PROCEDURES   bethanechol 25 MG tablet Commonly known as:  URECHOLINE TAKE 1 TABLET BY MOUTH THREE TIMES DAILY   Calcium 500 MG tablet Take 600 mg by mouth daily.   diazepam 5 MG tablet Commonly known as:  VALIUM Take 1 tablet (5 mg total) by mouth every 12 (twelve) hours as needed for muscle spasms. Take instead of ALprazolam    doxazosin 2 MG tablet Commonly known as:  CARDURA TK 1 T PO  BID   gabapentin 300 MG capsule Commonly known as:  NEURONTIN TAKE 3 CAPSULES BY MOUTH THREE TIMES DAILY, PLUS 2 CAPSULES EVERY DAY AS NEEDED   HYDROcodone-acetaminophen 7.5-325 MG tablet Commonly known as:  NORCO Take 1 tablet by mouth 3 (three) times daily.   magnesium 30 MG tablet Take 30 mg by mouth 2 (two) times daily as needed.   niacin 500 MG tablet Take 500 mg by mouth at bedtime.   OMEGA-3 FISH OIL PO Take 2 each by mouth every morning. EPA/DHA 520/350   polyethylene glycol packet Commonly known as:  MIRALAX / GLYCOLAX Take 17 g by mouth daily as needed.   predniSONE 20 MG tablet Commonly known as:  DELTASONE 40 mg x3d, 30 mg x3d, 20 mg x2d, 10 mg x2d   psyllium 58.6 % powder Commonly known as:  METAMUCIL Take 1 packet by mouth daily. 1 Teaspoon daily   SAM-e 400 MG Tabs Take 400 mg by mouth daily.   Vitamin D3 2000 units capsule Take 2,000 Units by mouth daily.   zinc gluconate 50 MG tablet Take 50 mg by mouth daily.       No results found for this or any previous visit (from the past 24 hour(s)). No results found.   ROS: Negative, with the exception of above mentioned in HPI   Objective:  BP 133/68 (BP Location: Right Arm, Patient Position: Sitting, Cuff Size: Large)   Pulse 66   Temp 97.9 F (36.6 C)   Resp 20   Wt 215 lb 8 oz (97.8 kg)   SpO2 97%   BMI 31.82 kg/m  Body mass index is 31.82 kg/m. Gen: Afebrile. No acute distress. Nontoxic in appearance, well developed, well nourished. Very pleasant caucasian male.  HENT: AT. [place].MMM, Eyes:Pupils Equal Round Reactive to light, Extraocular movements intact,  Conjunctiva without redness, discharge or icterus. MSK: left toe: mild erythema distal large toe to 1st metatarsal proximal joint. Mild TTP over area, with moderate pain at the joint. FROM with mild discomfort. NV intact distally.  Neuro: mild limp in gait. PERLA. EOMi.  Alert. Oriented x3   Assessment/Plan: [name] is a 82 y.o. male present for acute OV for  Acute gout involving toe of left foot, unspecified cause - symptoms appear either arthritic or gout. Possible prior gout history. Does not tolerate NSAIDS well.  - CBC w/Diff, rule  out infectious cause. He does not appear ill.  - Uric acid - methylPREDNISolone acetate (DEPO-MEDROL) injection 80 mg; Inject 1 mL (80 mg total) into the muscle once. - he is prescribed narcotic medications for pain by PCP.  - F/u PCP 1 week.    electronically signed by:  Howard Pouch, DO  [place]

## 2016-05-02 NOTE — Patient Instructions (Signed)
I have called in prednisone for you to start tomorrow and taper.  Steroid shot today should help decrease your discomfort more quickly.  We will call you with your labs once available.  Currently it appears to be wither gout or an arthritis flare, we will rule out infection with labs.  F/U with PCP in 1 week, or sooner if worsening.     Gout Gout is an inflammatory arthritis caused by a buildup of uric acid crystals in the joints. Uric acid is a chemical that is normally present in the blood. When the level of uric acid in the blood is too high it can form crystals that deposit in your joints and tissues. This causes joint redness, soreness, and swelling (inflammation). Repeat attacks are common. Over time, uric acid crystals can form into masses (tophi) near a joint, destroying bone and causing disfigurement. Gout is treatable and often preventable. CAUSES  The disease begins with elevated levels of uric acid in the blood. Uric acid is produced by your body when it breaks down a naturally found substance called purines. Certain foods you eat, such as meats and fish, contain high amounts of purines. Causes of an elevated uric acid level include:  Being passed down from parent to child (heredity).  Diseases that cause increased uric acid production (such as obesity, psoriasis, and certain cancers).  Excessive alcohol use.  Diet, especially diets rich in meat and seafood.  Medicines, including certain cancer-fighting medicines (chemotherapy), water pills (diuretics), and aspirin.  Chronic kidney disease. The kidneys are no longer able to remove uric acid well.  Problems with metabolism. Conditions strongly associated with gout include:  Obesity.  High blood pressure.  High cholesterol.  Diabetes. Not everyone with elevated uric acid levels gets gout. It is not understood why some people get gout and others do not. Surgery, joint injury, and eating too much of certain foods are some  of the factors that can lead to gout attacks. SYMPTOMS   An attack of gout comes on quickly. It causes intense pain with redness, swelling, and warmth in a joint.  Fever can occur.  Often, only one joint is involved. Certain joints are more commonly involved:  Base of the big toe.  Knee.  Ankle.  Wrist.  Finger. Without treatment, an attack usually goes away in a few days to weeks. Between attacks, you usually will not have symptoms, which is different from many other forms of arthritis. DIAGNOSIS  Your caregiver will suspect gout based on your symptoms and exam. In some cases, tests may be recommended. The tests may include:  Blood tests.  Urine tests.  X-rays.  Joint fluid exam. This exam requires a needle to remove fluid from the joint (arthrocentesis). Using a microscope, gout is confirmed when uric acid crystals are seen in the joint fluid. TREATMENT  There are two phases to gout treatment: treating the sudden onset (acute) attack and preventing attacks (prophylaxis).  Treatment of an Acute Attack.  Medicines are used. These include anti-inflammatory medicines or steroid medicines.  An injection of steroid medicine into the affected joint is sometimes necessary.  The painful joint is rested. Movement can worsen the arthritis.  You may use warm or cold treatments on painful joints, depending which works best for you.  Treatment to Prevent Attacks.  If you suffer from frequent gout attacks, your caregiver may advise preventive medicine. These medicines are started after the acute attack subsides. These medicines either help your kidneys eliminate uric acid from your  body or decrease your uric acid production. You may need to stay on these medicines for a very long time.  The early phase of treatment with preventive medicine can be associated with an increase in acute gout attacks. For this reason, during the first few months of treatment, your caregiver may also  advise you to take medicines usually used for acute gout treatment. Be sure you understand your caregiver's directions. Your caregiver may make several adjustments to your medicine dose before these medicines are effective.  Discuss dietary treatment with your caregiver or dietitian. Alcohol and drinks high in sugar and fructose and foods such as meat, poultry, and seafood can increase uric acid levels. Your caregiver or dietitian can advise you on drinks and foods that should be limited. HOME CARE INSTRUCTIONS   Do not take aspirin to relieve pain. This raises uric acid levels.  Only take over-the-counter or prescription medicines for pain, discomfort, or fever as directed by your caregiver.  Rest the joint as much as possible. When in bed, keep sheets and blankets off painful areas.  Keep the affected joint raised (elevated).  Apply warm or cold treatments to painful joints. Use of warm or cold treatments depends on which works best for you.  Use crutches if the painful joint is in your leg.  Drink enough fluids to keep your urine clear or pale yellow. This helps your body get rid of uric acid. Limit alcohol, sugary drinks, and fructose drinks.  Follow your dietary instructions. Pay careful attention to the amount of protein you eat. Your daily diet should emphasize fruits, vegetables, whole grains, and fat-free or low-fat milk products. Discuss the use of coffee, vitamin C, and cherries with your caregiver or dietitian. These may be helpful in lowering uric acid levels.  Maintain a healthy body weight. SEEK MEDICAL CARE IF:   You develop diarrhea, vomiting, or any side effects from medicines.  You do not feel better in 24 hours, or you are getting worse. SEEK IMMEDIATE MEDICAL CARE IF:   Your joint becomes suddenly more tender, and you have chills or a fever. MAKE SURE YOU:   Understand these instructions.  Will watch your condition.  Will get help right away if you are not  doing well or get worse.   This information is not intended to replace advice given to you by your health care provider. Make sure you discuss any questions you have with your health care provider.   Document Released: 06/08/2000 Document Revised: 07/02/2014 Document Reviewed: 01/23/2012 Elsevier Interactive Patient Education [employer].

## 2016-05-03 ENCOUNTER — Telehealth: Payer: Self-pay | Admitting: Family Medicine

## 2016-05-03 LAB — CBC WITH DIFFERENTIAL/PLATELET
BASOS PCT: 0.3 % (ref 0.0–3.0)
Basophils Absolute: 0 10*3/uL (ref 0.0–0.1)
EOS ABS: 0.1 10*3/uL (ref 0.0–0.7)
Eosinophils Relative: 1 % (ref 0.0–5.0)
HCT: 33.6 % — ABNORMAL LOW (ref 39.0–52.0)
HEMOGLOBIN: 11.2 g/dL — AB (ref 13.0–17.0)
Lymphocytes Relative: 27.6 % (ref 12.0–46.0)
Lymphs Abs: 1.9 10*3/uL (ref 0.7–4.0)
MCHC: 33.3 g/dL (ref 30.0–36.0)
MCV: 99.8 fl (ref 78.0–100.0)
MONO ABS: 2.2 10*3/uL — AB (ref 0.1–1.0)
Monocytes Relative: 31.7 % — ABNORMAL HIGH (ref 3.0–12.0)
Neutro Abs: 2.8 10*3/uL (ref 1.4–7.7)
Neutrophils Relative %: 39.4 % — ABNORMAL LOW (ref 43.0–77.0)
PLATELETS: 151 10*3/uL (ref 150.0–400.0)
RBC: 3.36 Mil/uL — ABNORMAL LOW (ref 4.22–5.81)
RDW: 15.1 % (ref 11.5–15.5)
WBC: 7.1 10*3/uL (ref 4.0–10.5)

## 2016-05-03 NOTE — Telephone Encounter (Signed)
Left detailed message with results and instructions on patient voice mail per DPR 

## 2016-05-03 NOTE — Telephone Encounter (Signed)
Please call pt: - his labs do not show signs of infection and his uric acid is normal (high end normal). - I recommend treating with the steroids, as if it was a gout flare. He is to monitor for fever, chills, worsening redness or pain. If he worsens he is to followup with his PCP immediately. As long as he is doing well, he should followup with his PCP in 1 week.

## 2016-05-04 ENCOUNTER — Encounter: Payer: Self-pay | Admitting: Hematology & Oncology

## 2016-05-14 ENCOUNTER — Encounter: Payer: Managed Care, Other (non HMO) | Admitting: Physician Assistant

## 2016-05-14 ENCOUNTER — Encounter: Payer: Self-pay | Admitting: Family Medicine

## 2016-05-14 DIAGNOSIS — G894 Chronic pain syndrome: Secondary | ICD-10-CM

## 2016-05-14 MED ORDER — HYDROCODONE-ACETAMINOPHEN 7.5-325 MG PO TABS: 1.0000 | ORAL_TABLET | Freq: Three times a day (TID) | ORAL | 0 refills | Status: DC

## 2016-05-15 ENCOUNTER — Other Ambulatory Visit (HOSPITAL_BASED_OUTPATIENT_CLINIC_OR_DEPARTMENT_OTHER): Payer: Medicare Other

## 2016-05-15 ENCOUNTER — Ambulatory Visit (HOSPITAL_BASED_OUTPATIENT_CLINIC_OR_DEPARTMENT_OTHER): Payer: Medicare Other | Admitting: Hematology & Oncology

## 2016-05-15 VITALS — BP 113/53 | HR 63 | Temp 97.7°F | Resp 16 | Wt 221.0 lb

## 2016-05-15 DIAGNOSIS — Z8572 Personal history of non-Hodgkin lymphomas: Secondary | ICD-10-CM

## 2016-05-15 DIAGNOSIS — D6481 Anemia due to antineoplastic chemotherapy: Secondary | ICD-10-CM

## 2016-05-15 DIAGNOSIS — N183 Chronic kidney disease, stage 3 unspecified: Secondary | ICD-10-CM

## 2016-05-15 DIAGNOSIS — C8333 Diffuse large B-cell lymphoma, intra-abdominal lymph nodes: Secondary | ICD-10-CM | POA: Diagnosis not present

## 2016-05-15 DIAGNOSIS — T451X5A Adverse effect of antineoplastic and immunosuppressive drugs, initial encounter: Principal | ICD-10-CM

## 2016-05-15 DIAGNOSIS — D539 Nutritional anemia, unspecified: Secondary | ICD-10-CM

## 2016-05-15 LAB — COMPREHENSIVE METABOLIC PANEL
ALT: 22 U/L (ref 0–55)
AST: 23 U/L (ref 5–34)
Albumin: 3.6 g/dL (ref 3.5–5.0)
Alkaline Phosphatase: 69 U/L (ref 40–150)
Anion Gap: 9 mEq/L (ref 3–11)
BUN: 29.1 mg/dL — AB (ref 7.0–26.0)
CALCIUM: 9.7 mg/dL (ref 8.4–10.4)
CHLORIDE: 105 meq/L (ref 98–109)
CO2: 26 mEq/L (ref 22–29)
Creatinine: 1.7 mg/dL — ABNORMAL HIGH (ref 0.7–1.3)
EGFR: 36 mL/min/{1.73_m2} — AB (ref >90)
Glucose: 95 mg/dl (ref 70–140)
POTASSIUM: 4.3 meq/L (ref 3.5–5.1)
Sodium: 141 mEq/L (ref 136–145)
Total Bilirubin: 0.84 mg/dL (ref 0.20–1.20)
Total Protein: 7.3 g/dL (ref 6.4–8.3)

## 2016-05-15 LAB — CBC WITH DIFFERENTIAL (CANCER CENTER ONLY)
BASO#: 0.1 10*3/uL (ref 0.0–0.2)
BASO%: 1.5 % (ref 0.0–2.0)
EOS%: 1 % (ref 0.0–7.0)
Eosinophils Absolute: 0.1 10*3/uL (ref 0.0–0.5)
HEMATOCRIT: 34.2 % — AB (ref 38.7–49.9)
HGB: 11.3 g/dL — ABNORMAL LOW (ref 13.0–17.1)
LYMPH#: 1.9 10*3/uL (ref 0.9–3.3)
LYMPH%: 31.7 % (ref 14.0–48.0)
MCH: 33.8 pg — ABNORMAL HIGH (ref 28.0–33.4)
MCHC: 33 g/dL (ref 32.0–35.9)
MCV: 102 fL — AB (ref 82–98)
MONO#: 2.1 10*3/uL — AB (ref 0.1–0.9)
MONO%: 34.3 % — ABNORMAL HIGH (ref 0.0–13.0)
NEUT#: 1.9 10*3/uL (ref 1.5–6.5)
NEUT%: 31.5 % — AB (ref 40.0–80.0)
Platelets: 126 10*3/uL — ABNORMAL LOW (ref 145–400)
RBC: 3.34 10*6/uL — ABNORMAL LOW (ref 4.20–5.70)
RDW: 14.4 % (ref 11.1–15.7)
WBC: 6.1 10*3/uL (ref 4.0–10.0)

## 2016-05-15 LAB — IRON AND TIBC
%SAT: 46 % (ref 20–55)
IRON: 110 ug/dL (ref 42–163)
TIBC: 240 ug/dL (ref 202–409)
UIBC: 131 ug/dL (ref 117–376)

## 2016-05-15 LAB — LACTATE DEHYDROGENASE: LDH: 197 U/L (ref 125–245)

## 2016-05-15 LAB — CHCC SATELLITE - SMEAR

## 2016-05-15 LAB — FERRITIN: Ferritin: 214 ng/ml (ref 22–316)

## 2016-05-15 LAB — TECHNOLOGIST REVIEW CHCC SATELLITE

## 2016-05-15 NOTE — Progress Notes (Signed)
Hematology and Oncology Follow Up Visit  Jeremiah Walker [id number] [birth year] 82 y.o. 05/15/2016   Principle Diagnosis:   History of diffuse large cell non-Hodgkin's lymphoma-treated in [place]  Macrocytic anemia  Current Therapy:    Observation     Interim History:  Jeremiah Walker is back for follow-up. He had a good fall. His family is coming in over 4 Thanksgiving. They will be going to his son's house. He is looking forward to this.  He does feel quite tired. He does not have as much energy. He is trying to exercise. He is eating okay. He's had no nausea or vomiting. 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  We last saw him in October, his ferritin was 188 with an iron saturation of 39%.  His appetite is okay.  He's had no cough or shortness of breath. He's had some leg swelling. He has had no rashes. He did have some diarrhea up in [place]. This seemed to be some type of infectious diarrhea as other family members have the same thing.  Overall, his performance status is ECOG 1.  Medications:  Current Outpatient Prescriptions:  .  ALPRAZolam (XANAX) 0.5 MG tablet, Take 1 tablet (0.5 mg total) by mouth 3 (three) times daily as needed for anxiety., Disp: 90 tablet, Rfl: 1 .  amoxicillin (AMOXIL) 500 MG capsule, Take 4 capsules (2,000 mg total) by mouth as directed. ONE HOUR PRIOR TO DENTAL PROCEDURES, Disp: 4 capsule, Rfl: 1 .  bethanechol (URECHOLINE) 25 MG tablet, TAKE 1 TABLET BY MOUTH THREE TIMES DAILY, Disp: 90 tablet, Rfl: 2 .  Calcium 500 MG tablet, Take 600 mg by mouth daily., Disp: , Rfl:  .  Cholecalciferol (VITAMIN D3) 2000 UNITS capsule, Take 2,000 Units by mouth daily., Disp: , Rfl:  .  diazepam (VALIUM) 5 MG tablet, Take 1 tablet (5 mg total) by mouth every 12 (twelve) hours as needed for muscle spasms. Take instead of ALprazolam, Disp: 10 tablet, Rfl: 1 .  gabapentin (NEURONTIN) 300 MG capsule, TAKE 3 CAPSULES BY MOUTH THREE TIMES DAILY, PLUS 2 CAPSULES  EVERY DAY AS NEEDED, Disp: 80 capsule, Rfl: 0 .  HYDROcodone-acetaminophen (NORCO) 7.5-325 MG tablet, Take 1 tablet by mouth 3 (three) times daily., Disp: 90 tablet, Rfl: 0 .  magnesium 30 MG tablet, Take 30 mg by mouth 2 (two) times daily as needed., Disp: , Rfl:  .  niacin 500 MG tablet, Take 500 mg by mouth at bedtime., Disp: , Rfl:  .  Omega-3 Fatty Acids (OMEGA-3 FISH OIL PO), Take 2 each by mouth every morning. EPA/DHA 520/350, Disp: , Rfl:  .  polyethylene glycol (MIRALAX / GLYCOLAX) packet, Take 17 g by mouth daily as needed., Disp: , Rfl:  .  predniSONE (DELTASONE) 20 MG tablet, 40 mg x3d, 30 mg x3d, 20 mg x2d, 10 mg x2d, Disp: 11 tablet, Rfl: 0 .  psyllium (METAMUCIL) 58.6 % powder, Take 1 packet by mouth daily. 1 Teaspoon daily, Disp: , Rfl:  .  S-Adenosylmethionine (SAM-E) 400 MG TABS, Take 400 mg by mouth daily., Disp: , Rfl:  .  zinc gluconate 50 MG tablet, Take 50 mg by mouth daily., Disp: , Rfl:  .  doxazosin (CARDURA) 2 MG tablet, TK 1 T PO  BID, Disp: 180 tablet, Rfl: 1  Allergies:  Allergies  Allergen Reactions  . Nsaids Other (See Comments) and Tinitus    Bruising   . Ciprofloxacin     Other reaction(s): Other (See Comments) Achilles tendon  .  Statins     Other reaction(s): Kidney issues Muscle pain  . Requip [Ropinirole Hcl] Anxiety  . Ropinirole Anxiety    Mood swing    Past Medical History, Surgical history, Social history, and Family History were reviewed and updated.  Review of Systems: As above  Physical Exam:  weight is 221 lb (100.2 kg). His oral temperature is 97.7 F (36.5 C). His blood pressure is 113/53 (abnormal) and his pulse is 63. His respiration is 16.   Wt Readings from Last 3 Encounters:  05/15/16 221 lb (100.2 kg)  05/02/16 215 lb 8 oz (97.8 kg)  04/23/16 223 lb 2 oz (101.2 kg)     Head and neck exam shows no ocular or oral lesions. He has no palpable cervical or supraclavicular lymph nodes. Lungs are clear. Cardiac exam regular  rate and rhythm with no murmurs, rubs or bruits. Abdomen is soft. He has good bowel sounds. There is no fluid wave. There is no palpable liver or spleen tip. Axillary exam shows no bilateral axillary adenopathy. Back exam shows no tenderness over the spine, ribs or hips. Extremities shows no clubbing, cyanosis or edema. He has good range of motion of his joints. He does have some osteoarthritic changes in his joints. Skin exam shows no rashes, ecchymoses or petechia. Neurological exam shows no focal neurological deficits.  Lab Results  Component Value Date   WBC 6.1 05/15/2016   HGB 11.3 (L) 05/15/2016   HCT 34.2 (L) 05/15/2016   MCV 102 (H) 05/15/2016   PLT 126 (L) 05/15/2016     Chemistry      Component Value Date/Time   NA 141 05/15/2016 1126   K 4.3 05/15/2016 1126   CO2 26 05/15/2016 1126   BUN 29.1 (H) 05/15/2016 1126   CREATININE 1.7 (H) 05/15/2016 1126   GLU 90 11/15/2014      Component Value Date/Time   CALCIUM 9.7 05/15/2016 1126   ALKPHOS 69 05/15/2016 1126   AST 23 05/15/2016 1126   ALT 22 05/15/2016 1126   BILITOT 0.84 05/15/2016 1126         Impression and Plan: Jeremiah Walker is a 82 year old white male. He had a history of diffuse large cell non-Hodgkin's lymphoma. He was treated  with 6 cycles of R-CHOP. He got this treatment up in [place]. He completed this back in April 2014.   His blood counts really are holding pretty steady. He is feeling more tired. I do suspect that he probably has some element of myelodysplasia.  I looked at his blood smear. I do not see anything on the blood smear that looked suspicious.  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 I do not see anything that looked like transformation over to an leukemic process.  His renal function is worsening. This might be part of why he does not feel all that well.  His iron studies are okay. His ferritin is 214 with iron saturation of 46%.  He really does not want to come back to see [place] but  for 6 months. I think this would be okay. He certainly knows that he can call us and come back sooner if he has any problems.  I spent  30 minutes with him today.     [name], MD 11/21/20172:35 PM

## 2016-05-16 ENCOUNTER — Emergency Department (HOSPITAL_BASED_OUTPATIENT_CLINIC_OR_DEPARTMENT_OTHER)
Admission: EM | Admit: 2016-05-16 | Discharge: 2016-05-16 | Disposition: A | Discharge status: Home / Self Care | Payer: Medicare Other | Attending: Emergency Medicine | Admitting: Emergency Medicine

## 2016-05-16 ENCOUNTER — Encounter (HOSPITAL_BASED_OUTPATIENT_CLINIC_OR_DEPARTMENT_OTHER): Payer: Self-pay

## 2016-05-16 DIAGNOSIS — S61211A Laceration without foreign body of left index finger without damage to nail, initial encounter: Secondary | ICD-10-CM | POA: Insufficient documentation

## 2016-05-16 DIAGNOSIS — Z87891 Personal history of nicotine dependence: Secondary | ICD-10-CM | POA: Insufficient documentation

## 2016-05-16 DIAGNOSIS — Z859 Personal history of malignant neoplasm, unspecified: Secondary | ICD-10-CM | POA: Diagnosis not present

## 2016-05-16 DIAGNOSIS — Y929 Unspecified place or not applicable: Secondary | ICD-10-CM | POA: Insufficient documentation

## 2016-05-16 DIAGNOSIS — I129 Hypertensive chronic kidney disease with stage 1 through stage 4 chronic kidney disease, or unspecified chronic kidney disease: Secondary | ICD-10-CM | POA: Diagnosis not present

## 2016-05-16 DIAGNOSIS — Y999 Unspecified external cause status: Secondary | ICD-10-CM | POA: Diagnosis not present

## 2016-05-16 DIAGNOSIS — Y939 Activity, unspecified: Secondary | ICD-10-CM | POA: Diagnosis not present

## 2016-05-16 DIAGNOSIS — Z79899 Other long term (current) drug therapy: Secondary | ICD-10-CM | POA: Insufficient documentation

## 2016-05-16 DIAGNOSIS — W268XXA Contact with other sharp object(s), not elsewhere classified, initial encounter: Secondary | ICD-10-CM | POA: Insufficient documentation

## 2016-05-16 DIAGNOSIS — N183 Chronic kidney disease, stage 3 (moderate): Secondary | ICD-10-CM | POA: Insufficient documentation

## 2016-05-16 LAB — RETICULOCYTES: RETICULOCYTE COUNT: 1.5 % (ref 0.6–2.6)

## 2016-05-16 MED ORDER — LIDOCAINE-EPINEPHRINE-TETRACAINE (LET) SOLUTION
3.0000 mL | Freq: Once | NASAL | Status: AC
  Administered 2016-05-16: 3 mL via TOPICAL
  Filled 2016-05-16: qty 3

## 2016-05-16 NOTE — ED Notes (Signed)
Patient denies pain and is resting comfortably.  

## 2016-05-16 NOTE — ED Provider Notes (Signed)
[facility] Provider Note   CSN: [id number] Arrival date & time: 05/16/16  1146     History   Chief Complaint Chief Complaint  Patient presents with  . Finger Injury    HPI Jeremiah Walker is a 82 y.o. male.  The history is provided by the patient. No language interpreter was used.  Laceration   The incident occurred less than 1 hour ago. The laceration is located on the left hand. The laceration is 1 cm in size. Injury mechanism: inside scissor. The pain is at a severity of 2/10. The pain is mild. The pain has been constant since onset. He reports no foreign bodies present. His tetanus status is UTD.    Past Medical History:  Diagnosis Date  . Anemia   . Anemia due to antineoplastic chemotherapy 08/15/2015  . Anxiety   . Arthritis   . Cancer ([place])   . Cataract   . Chronic kidney disease (CKD), stage III (moderate) 02/09/2015   Controlled  . Combined fat and carbohydrate induced hyperlipemia 02/09/2015   Controlled  . Depression   . DLBCL (diffuse large B cell lymphoma) ([place]) 08/15/2015  . Essential (primary) hypertension 02/09/2015  . Gastro-esophageal reflux disease without esophagitis 02/09/2015   Controlled  . Neuromuscular disorder ([place])   . Tinnitus     Patient Active Problem List   Diagnosis Date Noted  . Chronic pain syndrome 09/26/2015  . De Quervain's tenosynovitis, right 09/13/2015  . Anemia due to antineoplastic chemotherapy 08/15/2015  . DLBCL (diffuse large B cell lymphoma) ([place]) 08/15/2015  . Skin lesion of face 05/21/2015  . Peripheral neuropathy due to chemotherapy ([place]) 02/09/2015  . Benign prostatic hyperplasia with urinary obstruction 02/09/2015  . Bilateral hearing loss 02/09/2015  . Chronic LBP 02/09/2015  . Chronic kidney disease (CKD), stage III (moderate) 02/09/2015  . Essential (primary) hypertension 02/09/2015  . Gastro-esophageal reflux disease without esophagitis 02/09/2015  . Anxiety, generalized 02/09/2015  . Cannot  sleep 02/09/2015  . Combined fat and carbohydrate induced hyperlipemia 02/09/2015    Past Surgical History:  Procedure Laterality Date  . CATARACT EXTRACTION, BILATERAL    . COLONOSCOPY  May 2011  . CYSTOSCOPY    . TOTAL HIP ARTHROPLASTY  2003   Right   . TOTAL HIP ARTHROPLASTY  April, 2011   Left  . TRIGGER FINGER RELEASE  Nov 2013  . TURBINECTOMY  2006  . UPPER GI ENDOSCOPY  Nov 2015       Home Medications    Prior to Admission medications   Medication Sig Start Date End Date Taking? Authorizing Provider  ALPRAZolam Duanne Moron) 0.5 MG tablet Take 1 tablet (0.5 mg total) by mouth 3 (three) times daily as needed for anxiety. 03/13/16   Brunetta Jeans, PA-C  amoxicillin (AMOXIL) 500 MG capsule Take 4 capsules (2,000 mg total) by mouth as directed. ONE HOUR PRIOR TO DENTAL PROCEDURES 11/23/15   Brunetta Jeans, PA-C  bethanechol (URECHOLINE) 25 MG tablet TAKE 1 TABLET BY MOUTH THREE TIMES DAILY 04/16/16   Brunetta Jeans, PA-C  Calcium 500 MG tablet Take 600 mg by mouth daily.    Historical Provider, MD  Cholecalciferol (VITAMIN D3) 2000 UNITS capsule Take 2,000 Units by mouth daily.    Historical Provider, MD  diazepam (VALIUM) 5 MG tablet Take 1 tablet (5 mg total) by mouth every 12 (twelve) hours as needed for muscle spasms. Take instead of ALprazolam 04/23/16   Brunetta Jeans, PA-C  doxazosin (CARDURA) 2 MG tablet  TK 1 T PO  BID 09/13/15   Brunetta Jeans, PA-C  gabapentin (NEURONTIN) 300 MG capsule TAKE 3 CAPSULES BY MOUTH THREE TIMES DAILY, PLUS 2 CAPSULES EVERY DAY AS NEEDED 03/30/16   Brunetta Jeans, PA-C  HYDROcodone-acetaminophen (NORCO) 7.5-325 MG tablet Take 1 tablet by mouth 3 (three) times daily. 05/14/16   Gay Filler Copland, MD  magnesium 30 MG tablet Take 30 mg by mouth 2 (two) times daily as needed.    Historical Provider, MD  niacin 500 MG tablet Take 500 mg by mouth at bedtime.    Historical Provider, MD  Omega-3 Fatty Acids (OMEGA-3 FISH OIL PO) Take 2 each by  mouth every morning. EPA/DHA 520/350    Historical Provider, MD  polyethylene glycol (MIRALAX / GLYCOLAX) packet Take 17 g by mouth daily as needed.    Historical Provider, MD  predniSONE (DELTASONE) 20 MG tablet 40 mg x3d, 30 mg x3d, 20 mg x2d, 10 mg x2d 05/02/16   Renee A Kuneff, DO  psyllium (METAMUCIL) 58.6 % powder Take 1 packet by mouth daily. 1 Teaspoon daily    Historical Provider, MD  S-Adenosylmethionine (SAM-E) 400 MG TABS Take 400 mg by mouth daily.    Historical Provider, MD  zinc gluconate 50 MG tablet Take 50 mg by mouth daily.    Historical Provider, MD    Family History Family History  Problem Relation Age of Onset  . Anuerysm Mother 76    Deceased  . Colon cancer Father 26    Deceased  . Colon cancer Brother   . Prostate cancer Brother     Deceased  . Alzheimer's disease Brother   . Heart disease Brother   . Alzheimer's disease Paternal Grandmother   . Early death Maternal Grandmother     Unknown  . Obesity Son   . Obesity Son     Gastric Bypass  . Healthy Son   . Healthy Daughter   . Diabetes Neg Hx     Social History Social History  Substance Use Topics  . Smoking status: Former Research scientist (life sciences)  . Smokeless tobacco: Never Used     Comment: Quit >50 years ago  . Alcohol use 0.6 - 2.4 oz/week    1 - 4 Shots of liquor per week     Allergies   Nsaids; Ciprofloxacin; Statins; Requip [ropinirole hcl]; and Ropinirole   Review of Systems Review of Systems  Constitutional: Negative for chills and fever.  Eyes: Negative for pain.  Skin: Positive for wound.  All other systems reviewed and are negative.    Physical Exam Updated Vital Signs BP 117/59 (BP Location: Right Arm)   Pulse 66   Temp 98.4 F (36.9 C) (Oral)   Resp 18   Ht 5\' 9"  (1.753 m)   Wt 221 lb (100.2 kg)   SpO2 98%   BMI 32.64 kg/m   Physical Exam  Constitutional: He appears well-developed and well-nourished.  HENT:  Head: Normocephalic and atraumatic.  Eyes: Conjunctivae and EOM are  normal.  Neck: Normal range of motion.  Cardiovascular: Normal rate.   Pulmonary/Chest: Effort normal. No respiratory distress. He has no wheezes.  Abdominal: He exhibits no distension.  Musculoskeletal: Normal range of motion. He exhibits no edema or deformity.  Neurological: He is alert.  Skin: Skin is warm and dry.  Nursing note and vitals reviewed.    ED Treatments / Results  Labs (all labs ordered are listed, but only abnormal results are displayed) Labs Reviewed - No data  to display  EKG  EKG Interpretation None       Radiology No results found.  Procedures .Marland KitchenLaceration Repair Date/Time: 05/16/2016 1:53 PM Performed by: Merrily Pew Authorized by: Merrily Pew   Consent:    Consent obtained:  Verbal   Consent given by:  Patient   Risks discussed:  Infection and pain   Alternatives discussed:  No treatment Anesthesia (see MAR for exact dosages):    Anesthesia method:  Topical application   Topical anesthetic:  LET Laceration details:    Location:  Finger   Finger location:  L index finger   Length (cm):  1.5   Depth (mm):  2 Repair type:    Repair type:  Simple Pre-procedure details:    Preparation:  Patient was prepped and draped in usual sterile fashion Exploration:    Hemostasis achieved with:  LET   Wound exploration: wound explored through full range of motion and entire depth of wound probed and visualized     Contaminated: no   Treatment:    Area cleansed with:  Saline   Amount of cleaning:  Standard   Irrigation solution:  Sterile saline   Irrigation volume:  100   Irrigation method:  Syringe   Visualized foreign bodies/material removed: no   Skin repair:    Repair method:  Sutures   Suture size:  5-0   Suture technique:  Simple interrupted   Number of sutures:  3 Approximation:    Approximation:  Close   Vermilion border: well-aligned   Post-procedure details:    Dressing:  Antibiotic ointment and sterile dressing   Patient  tolerance of procedure:  Tolerated well, no immediate complications    (including critical care time)  Medications Ordered in ED Medications  lidocaine-EPINEPHrine-tetracaine (LET) solution (3 mLs Topical Given 05/16/16 1258)     Initial Impression / Assessment and Plan / ED Course  I have reviewed the triage vital signs and the nursing notes.  Pertinent labs & imaging results that were available during my care of the patient were reviewed by me and considered in my medical decision making (see chart for details).  Clinical Course     82 yo M w/ laceration as above, UTD on tetanus around 5  Years ago. No e/o tendon/ligament/arterial injury prior to or after repair. Will rtn in 7-10 days for wound check/suture removal.  Final Clinical Impressions(s) / ED Diagnoses   Final diagnoses:  Laceration of left index finger without foreign body without damage to nail, initial encounter    New Prescriptions New Prescriptions   No medications on file     Merrily Pew, MD 05/16/16 1357

## 2016-05-16 NOTE — ED Triage Notes (Signed)
Cut left index finger with scissors approx 1 hour PTA-NAD-steady gait

## 2016-05-21 ENCOUNTER — Encounter (INDEPENDENT_AMBULATORY_CARE_PROVIDER_SITE_OTHER): Payer: Self-pay | Admitting: Orthopaedic Surgery

## 2016-05-21 ENCOUNTER — Ambulatory Visit (INDEPENDENT_AMBULATORY_CARE_PROVIDER_SITE_OTHER): Payer: Medicare Other | Admitting: Orthopaedic Surgery

## 2016-05-21 VITALS — BP 122/68 | HR 68 | Ht 69.5 in | Wt 224.0 lb

## 2016-05-21 DIAGNOSIS — I700 Atherosclerosis of aorta: Secondary | ICD-10-CM

## 2016-05-21 DIAGNOSIS — M545 Low back pain: Secondary | ICD-10-CM | POA: Diagnosis not present

## 2016-05-21 DIAGNOSIS — G8929 Other chronic pain: Secondary | ICD-10-CM | POA: Diagnosis not present

## 2016-05-21 DIAGNOSIS — I7789 Other specified disorders of arteries and arterioles: Secondary | ICD-10-CM | POA: Diagnosis not present

## 2016-05-21 NOTE — Progress Notes (Signed)
Office Visit Note   Patient: Jeremiah Walker           Date of Birth: [birth year]           MRN: [id number] Visit Date: 05/21/2016              Requested by: Brunetta Jeans, PA-C [address] [place], [place] [address] PCP: Leeanne Rio, PA-C   Assessment & Plan: Visit Diagnoses: No diagnosis found.  Plan: Mr. Mccadden has chronic low back pain without sciatica. His last MRI scan was performed in 10-16-2011 demonstrating areas of degenerative arthritis. His symptoms are somewhat different over the last several years and I think it's worth obtaining a new MRI scan. He does have history of claustrophobia and will need to take his Xanax. I also think it is worthwhile to obtain an ultrasound of the aorta and we'll plan to see him back in the office after the above 2 studies.  Follow-Up Instructions: No Follow-up on file.   Orders:  No orders of the defined types were placed in this encounter.  No orders of the defined types were placed in this encounter.     Procedures: No procedures performed  He also had an MRI scan of his lumbar spine in 10-16-2011 revealing scoliosis and diffuse degenerative changes lumbar spine similar to prior studies that have been performed in 10/15/08 the most pronounced changes neuroforaminal narrowing at L4-5 on the right side and somewhat less severe at L3-4 on the right side. He had diffusely bulging disks otherwise noted at L2-3 through L4-5 without focal disc protrusion disc herniation. Clinical Data: Mr. Gienger has had films of his lumbar spine performed in [place] in Oct 15, 2008 demonstrating scoliosis and degenerative changes lumbar spine. He has a grade 1 spondylolisthesis of L4 and L5 No additional findings.   Subjective: No chief complaint on file.   Referred by Elyn Aquas, PA for LBP.  Xrays obtained 04/13/2016  Pt lives in assisted living, [place], foes to their gym for workouts        Mr. Dattilo has had problems with his low back for  many years. He has been treated with prior injections it made a difference. He moved to the [place] area just over a year ago and continues to have low back pain that seems to be exacerbated. He has had a course of physical therapy and does perform exercises on a daily basis at [facility] where he resides. He does not have any referred pain to either lower extremity but does have considerable discomfort in both lower extremities associated with weakness when he is on his feet for any length of time. Particularly short of breath nor does he have any chest pain. He does have some "weakness" since he was treated for the lymphoma. He presently has anemia. He takes Vicodin 3 times a day. He has difficulty with NSAIDs related to bleeding Review of Systems   Objective: Vital Signs: There were no vitals taken for this visit.  Physical Exam  Ortho Exam straight leg raise is negative bilaterally neurovascular exam appears to be intact. There were good pulses. Patient does use a quarter inch shoe lift on the left side related to his bilateral total hip replacements. There was no lower extremity swelling. He had minimal percussible tenderness of the lumbar spine. Painless range of motion of both hips.  No specialty comments available.  Imaging: Recent films of the lumbar spine revealed diffuse degenerative disc disease from T12-L5. There  is diffuse calcification within the abdominal aorta without aneurysmal dilatation. No results found.   PMFS History: Patient Active Problem List   Diagnosis Date Noted  . Chronic pain syndrome 09/26/2015  . De Quervain's tenosynovitis, right 09/13/2015  . Anemia due to antineoplastic chemotherapy 08/15/2015  . DLBCL (diffuse large B cell lymphoma) ([facility]) 08/15/2015  . Skin lesion of face 05/21/2015  . Peripheral neuropathy due to chemotherapy ([place]) 02/09/2015  . Benign prostatic hyperplasia with urinary obstruction 02/09/2015  . Bilateral hearing loss 02/09/2015  .  Chronic LBP 02/09/2015  . Chronic kidney disease (CKD), stage III (moderate) 02/09/2015  . Essential (primary) hypertension 02/09/2015  . Gastro-esophageal reflux disease without esophagitis 02/09/2015  . Anxiety, generalized 02/09/2015  . Cannot sleep 02/09/2015  . Combined fat and carbohydrate induced hyperlipemia 02/09/2015   Past Medical History:  Diagnosis Date  . Anemia   . Anemia due to antineoplastic chemotherapy 08/15/2015  . Anxiety   . Arthritis   . Cancer ([facility])   . Cataract   . Chronic kidney disease (CKD), stage III (moderate) 02/09/2015   Controlled  . Combined fat and carbohydrate induced hyperlipemia 02/09/2015   Controlled  . Depression   . DLBCL (diffuse large B cell lymphoma) ([place]) 08/15/2015  . Essential (primary) hypertension 02/09/2015  . Gastro-esophageal reflux disease without esophagitis 02/09/2015   Controlled  . Neuromuscular disorder ([facility])   . Tinnitus     Family History  Problem Relation Age of Onset  . Anuerysm Mother 14    Deceased  . Colon cancer Father 4    Deceased  . Colon cancer Brother   . Prostate cancer Brother     Deceased  . Alzheimer's disease Brother   . Heart disease Brother   . Alzheimer's disease Paternal Grandmother   . Early death Maternal Grandmother     Unknown  . Obesity Son   . Obesity Son     Gastric Bypass  . Healthy Son   . Healthy Daughter   . Diabetes Neg Hx     Past Surgical History:  Procedure Laterality Date  . CATARACT EXTRACTION, BILATERAL    . COLONOSCOPY  May 2011  . CYSTOSCOPY    . TOTAL HIP ARTHROPLASTY  2003   Right   . TOTAL HIP ARTHROPLASTY  April, 2011   Left  . TRIGGER FINGER RELEASE  Nov 2013  . TURBINECTOMY  2006  . UPPER GI ENDOSCOPY  Nov 2015   Social History   Occupational History  . Not on file.   Social History Main Topics  . Smoking status: Former Research scientist (life sciences)  . Smokeless tobacco: Never Used     Comment: Quit >50 years ago  . Alcohol use 0.6 - 2.4 oz/week    1 - 4 Shots of  liquor per week  . Drug use: No  . Sexual activity: Not on file

## 2016-05-23 ENCOUNTER — Ambulatory Visit (INDEPENDENT_AMBULATORY_CARE_PROVIDER_SITE_OTHER): Payer: Medicare Other | Admitting: Orthopedic Surgery

## 2016-05-27 ENCOUNTER — Encounter: Payer: Self-pay | Admitting: Family Medicine

## 2016-05-28 NOTE — Telephone Encounter (Signed)
Patient called this morning for an appointment to get his stitches removed. I scheduled him for 05/30/16 if he cannot have this done here please advise and I will call the patient.

## 2016-05-28 NOTE — Telephone Encounter (Signed)
Okay to keep as scheduled

## 2016-05-29 ENCOUNTER — Ambulatory Visit
Admission: RE | Admit: 2016-05-29 | Discharge: 2016-05-29 | Disposition: A | Discharge status: Home / Self Care | Payer: Medicare Other | Source: Ambulatory Visit | Attending: Orthopaedic Surgery | Admitting: Orthopaedic Surgery

## 2016-05-29 DIAGNOSIS — K219 Gastro-esophageal reflux disease without esophagitis: Secondary | ICD-10-CM | POA: Diagnosis not present

## 2016-05-29 DIAGNOSIS — C8330 Diffuse large B-cell lymphoma, unspecified site: Secondary | ICD-10-CM | POA: Diagnosis not present

## 2016-05-29 DIAGNOSIS — G8929 Other chronic pain: Secondary | ICD-10-CM

## 2016-05-29 DIAGNOSIS — M545 Low back pain, unspecified: Secondary | ICD-10-CM

## 2016-05-29 DIAGNOSIS — N2581 Secondary hyperparathyroidism of renal origin: Secondary | ICD-10-CM | POA: Diagnosis not present

## 2016-05-29 DIAGNOSIS — N183 Chronic kidney disease, stage 3 (moderate): Secondary | ICD-10-CM | POA: Diagnosis not present

## 2016-05-29 DIAGNOSIS — M48061 Spinal stenosis, lumbar region without neurogenic claudication: Secondary | ICD-10-CM | POA: Diagnosis not present

## 2016-05-29 DIAGNOSIS — M1990 Unspecified osteoarthritis, unspecified site: Secondary | ICD-10-CM | POA: Diagnosis not present

## 2016-05-29 DIAGNOSIS — I129 Hypertensive chronic kidney disease with stage 1 through stage 4 chronic kidney disease, or unspecified chronic kidney disease: Secondary | ICD-10-CM | POA: Diagnosis not present

## 2016-05-29 DIAGNOSIS — D631 Anemia in chronic kidney disease: Secondary | ICD-10-CM | POA: Diagnosis not present

## 2016-05-29 DIAGNOSIS — G629 Polyneuropathy, unspecified: Secondary | ICD-10-CM | POA: Diagnosis not present

## 2016-05-29 DIAGNOSIS — E785 Hyperlipidemia, unspecified: Secondary | ICD-10-CM | POA: Diagnosis not present

## 2016-05-29 DIAGNOSIS — Z6833 Body mass index (BMI) 33.0-33.9, adult: Secondary | ICD-10-CM | POA: Diagnosis not present

## 2016-05-29 DIAGNOSIS — F329 Major depressive disorder, single episode, unspecified: Secondary | ICD-10-CM | POA: Diagnosis not present

## 2016-05-30 ENCOUNTER — Ambulatory Visit (INDEPENDENT_AMBULATORY_CARE_PROVIDER_SITE_OTHER): Payer: Medicare Other | Admitting: Family Medicine

## 2016-05-30 ENCOUNTER — Encounter: Payer: Self-pay | Admitting: Family Medicine

## 2016-05-30 VITALS — BP 120/60 | HR 70 | Temp 98.0°F | Ht 69.5 in | Wt 223.2 lb

## 2016-05-30 DIAGNOSIS — Z4802 Encounter for removal of sutures: Secondary | ICD-10-CM | POA: Diagnosis not present

## 2016-05-30 NOTE — Progress Notes (Signed)
[facility] [address] [place], [place] [address] [phone]  Date:  05/30/2016   Name:  Jeremiah Walker   DOB:  [birth year]   MRN:  [id number]  PCP:  Leeanne Rio, PA-C    Chief Complaint: Suture / Staple Removal (Seen in the ER on 11/22 for laceration of left index finger. Pt states that 3 stitches were places, only 1 left. )   History of Present Illness:  Jeremiah Walker is a 82 y.o. very pleasant male patient who presents with the following:  He cut his left index finger with scissors while trying to open a package on 11/22- went to the ER.  2 of the 3 stitches came out on their own but he still has one left. Would like to have this removed today  Tetanus is UTD He does not have any sx of hypotension- not feeling lightheaded The finger is no longer painful, no sign of infection such as redness, heat or swelling.  He does not notice any loss of his ROM  BP Readings from Last 3 Encounters:  05/30/16 (!) 110/50  05/21/16 122/68  05/16/16 117/59     Patient Active Problem List   Diagnosis Date Noted  . Chronic pain syndrome 09/26/2015  . De Quervain's tenosynovitis, right 09/13/2015  . Anemia due to antineoplastic chemotherapy 08/15/2015  . DLBCL (diffuse large B cell lymphoma) ([place]) 08/15/2015  . Skin lesion of face 05/21/2015  . Peripheral neuropathy due to chemotherapy ([facility]) 02/09/2015  . Benign prostatic hyperplasia with urinary obstruction 02/09/2015  . Bilateral hearing loss 02/09/2015  . Low back pain 02/09/2015  . Chronic kidney disease (CKD), stage III (moderate) 02/09/2015  . Essential (primary) hypertension 02/09/2015  . Gastro-esophageal reflux disease without esophagitis 02/09/2015  . Anxiety, generalized 02/09/2015  . Cannot sleep 02/09/2015  . Combined fat and carbohydrate induced hyperlipemia 02/09/2015    Past Medical History:  Diagnosis Date  . Anemia   . Anemia due to  antineoplastic chemotherapy 08/15/2015  . Anxiety   . Arthritis   . Cancer ([facility])   . Cataract   . Chronic kidney disease (CKD), stage III (moderate) 02/09/2015   Controlled  . Combined fat and carbohydrate induced hyperlipemia 02/09/2015   Controlled  . Depression   . DLBCL (diffuse large B cell lymphoma) ([place] [facility] [place]) 08/15/2015  . Essential (primary) hypertension 02/09/2015  . Gastro-esophageal reflux disease without esophagitis 02/09/2015   Controlled  . Neuromuscular disorder ([place] [facility])   . Tinnitus     Past Surgical History:  Procedure Laterality Date  . CATARACT EXTRACTION, BILATERAL    . COLONOSCOPY  May 2011  . CYSTOSCOPY    . TOTAL HIP ARTHROPLASTY  2003   Right   . TOTAL HIP ARTHROPLASTY  April, 2011   Left  . TRIGGER FINGER RELEASE  Nov 2013  . TURBINECTOMY  2006  . UPPER GI ENDOSCOPY  Nov 2015    Social History  Substance Use Topics  . Smoking status: Former Research scientist (life sciences)  . Smokeless tobacco: Never Used     Comment: Quit >50 years ago  . Alcohol use 0.6 - 2.4 oz/week    1 - 4 Shots of liquor per week    Family History  Problem Relation Age of Onset  . Anuerysm Mother 87    Deceased  . Colon cancer Father 40    Deceased  . Colon cancer Brother   . Prostate cancer Brother  Deceased  . Alzheimer's disease Brother   . Heart disease Brother   . Alzheimer's disease Paternal Grandmother   . Early death Maternal Grandmother     Unknown  . Obesity Son   . Obesity Son     Gastric Bypass  . Healthy Son   . Healthy Daughter   . Diabetes Neg Hx     Allergies  Allergen Reactions  . Nsaids Other (See Comments) and Tinitus    Bruising   . Ciprofloxacin     Other reaction(s): Other (See Comments) Achilles tendon  . Statins     Other reaction(s): Kidney issues Muscle pain  . Requip [Ropinirole Hcl] Anxiety  . Ropinirole Anxiety    Mood swing    Medication list has been reviewed and updated.  Current Outpatient Prescriptions on File Prior to Visit  Medication Sig  Dispense Refill  . ALPRAZolam (XANAX) 0.5 MG tablet Take 1 tablet (0.5 mg total) by mouth 3 (three) times daily as needed for anxiety. 90 tablet 1  . amoxicillin (AMOXIL) 500 MG capsule Take 4 capsules (2,000 mg total) by mouth as directed. ONE HOUR PRIOR TO DENTAL PROCEDURES 4 capsule 1  . bethanechol (URECHOLINE) 25 MG tablet TAKE 1 TABLET BY MOUTH THREE TIMES DAILY 90 tablet 2  . Calcium 500 MG tablet Take 600 mg by mouth daily.    . Cholecalciferol (VITAMIN D3) 2000 UNITS capsule Take 2,000 Units by mouth daily.    . diazepam (VALIUM) 5 MG tablet Take 1 tablet (5 mg total) by mouth every 12 (twelve) hours as needed for muscle spasms. Take instead of ALprazolam 10 tablet 1  . doxazosin (CARDURA) 2 MG tablet TK 1 T PO  BID 180 tablet 1  . fluorouracil (EFUDEX) 5 % cream     . gabapentin (NEURONTIN) 300 MG capsule TAKE 3 CAPSULES BY MOUTH THREE TIMES DAILY, PLUS 2 CAPSULES EVERY DAY AS NEEDED 80 capsule 0  . HYDROcodone-acetaminophen (NORCO) 7.5-325 MG tablet Take 1 tablet by mouth 3 (three) times daily. 90 tablet 0  . magnesium 30 MG tablet Take 30 mg by mouth 2 (two) times daily as needed.    . niacin 500 MG tablet Take 500 mg by mouth at bedtime.    . Omega-3 Fatty Acids (OMEGA-3 FISH OIL PO) Take 2 each by mouth every morning. EPA/DHA 520/350    . polyethylene glycol (MIRALAX / GLYCOLAX) packet Take 17 g by mouth daily as needed.    . predniSONE (DELTASONE) 20 MG tablet 40 mg x3d, 30 mg x3d, 20 mg x2d, 10 mg x2d 11 tablet 0  . psyllium (METAMUCIL) 58.6 % powder Take 1 packet by mouth daily. 1 Teaspoon daily    . S-Adenosylmethionine (SAM-E) 400 MG TABS Take 400 mg by mouth daily.    Marland Kitchen zinc gluconate 50 MG tablet Take 50 mg by mouth daily.     No current facility-administered medications on file prior to visit.     Review of Systems:  As per HPI- otherwise negative. Feeling well today  Physical Examination: Vitals:   05/30/16 0859  BP: (!) 110/50  Pulse: 70  Temp: 98 F (36.7 C)    Vitals:   05/30/16 0859  Weight: 223 lb 3.2 oz (101.2 kg)  Height: 5' 9.5" (1.765 m)   Body mass index is 32.49 kg/m. Ideal Body Weight: Weight in (lb) to have BMI = 25: 171.4   GEN: WDWN, NAD, Non-toxic, Alert & Oriented x 3 HEENT: Atraumatic, Normocephalic.  Ears and Nose:  No external deformity. EXTR: No clubbing/cyanosis/edema NEURO: Normal gait.  PSYCH: Normally interactive. Conversant. Not depressed or anxious appearing.  Calm demeanor.  Appears well Repeat BP normal Removed 1 SI suture from left index finger- the wound is well healed and shows no sign of infection. Flexor and extensor tendon strength is normal.   Assessment and Plan: Visit for suture removal  SR today as above He is seeing me next week to establish care Repeat BP normal range- he is feeling well today  Signed Lamar Blinks, MD

## 2016-05-30 NOTE — Progress Notes (Signed)
Pre visit review using our clinic review tool, if applicable. No additional management support is needed unless otherwise documented below in the visit note. 

## 2016-05-31 ENCOUNTER — Ambulatory Visit (INDEPENDENT_AMBULATORY_CARE_PROVIDER_SITE_OTHER): Payer: Medicare Other | Admitting: Orthopaedic Surgery

## 2016-06-04 ENCOUNTER — Ambulatory Visit (INDEPENDENT_AMBULATORY_CARE_PROVIDER_SITE_OTHER): Payer: Medicare Other | Admitting: Family Medicine

## 2016-06-04 ENCOUNTER — Encounter (INDEPENDENT_AMBULATORY_CARE_PROVIDER_SITE_OTHER): Payer: Self-pay | Admitting: Orthopaedic Surgery

## 2016-06-04 VITALS — BP 122/51 | HR 65 | Temp 97.5°F | Ht 69.5 in | Wt 225.4 lb

## 2016-06-04 DIAGNOSIS — G2581 Restless legs syndrome: Secondary | ICD-10-CM

## 2016-06-04 DIAGNOSIS — G894 Chronic pain syndrome: Secondary | ICD-10-CM | POA: Diagnosis not present

## 2016-06-04 DIAGNOSIS — Z0000 Encounter for general adult medical examination without abnormal findings: Secondary | ICD-10-CM | POA: Diagnosis not present

## 2016-06-04 DIAGNOSIS — G4700 Insomnia, unspecified: Secondary | ICD-10-CM | POA: Diagnosis not present

## 2016-06-04 MED ORDER — HYDROCODONE-ACETAMINOPHEN 7.5-325 MG PO TABS: 1.0000 | ORAL_TABLET | Freq: Four times a day (QID) | ORAL | 0 refills | Status: DC | PRN

## 2016-06-04 MED ORDER — ALPRAZOLAM 0.5 MG PO TABS: 0.5000 mg | ORAL_TABLET | Freq: Three times a day (TID) | ORAL | 1 refills | Status: DC | PRN

## 2016-06-04 NOTE — Patient Instructions (Addendum)
The half life of gabapentin is 5-7 hours; if you want to try and taper down on this gradually it is ok!  Let me know what your orthopedist says tomorrow about your back   I refilled your alprazolam. You can also refill your hydrocodone soon- it is a little bit early for a refill now.  I did give you #100 and wrote it for every 6 hours in case you do need to take it 4x a day for your back   Bring a copy of your advance directives to your next office visit.

## 2016-06-04 NOTE — Progress Notes (Addendum)
Subjective:   Jeremiah Walker is a 82 y.o. male who presents for an Initial Medicare Annual Wellness Visit.  Review of Systems  No ROS.  Medicare Wellness Visit.  Cardiac Risk Factors include: advanced age (>41men, >63 women);dyslipidemia;hypertension;male gender;obesity (BMI >30kg/m2)  Sleep patterns: has difficulty falling asleep. Takes Xanax PRN sleep and tries to sleep 7 hrs nightly. Gets up once nightly or less to void.  Home Safety/Smoke Alarms: Feels safe in home. Smoke alarms in place.   Living environment; residence: apartment, [employer], equipment: Cane, Type: [place]. Seat Belt Safety/Bike Helmet: Wears seat belt.   Counseling:    Dental- Dr. Sheliah Plane every 6 months  Male:   CCS- N/A due to age.     PSA- Not on file. Given pt's age, defer screening recommendations to PCP.    Objective:    Today's Vitals   06/04/16 1412  BP: (!) 122/51  Pulse: 65  Temp: 97.5 F (36.4 C)  TempSrc: Oral  SpO2: 96%  Weight: 225 lb 6.4 oz (102.2 kg)  Height: 5' 9.5" (1.765 m)   Body mass index is 32.81 kg/m.  Current Medications (verified) Outpatient Encounter Prescriptions as of 06/04/2016  Medication Sig  . ALPRAZolam (XANAX) 0.5 MG tablet Take 1 tablet (0.5 mg total) by mouth 3 (three) times daily as needed for anxiety.  Marland Kitchen amoxicillin (AMOXIL) 500 MG capsule Take 4 capsules (2,000 mg total) by mouth as directed. ONE HOUR PRIOR TO DENTAL PROCEDURES  . bethanechol (URECHOLINE) 25 MG tablet TAKE 1 TABLET BY MOUTH THREE TIMES DAILY  . Calcium 500 MG tablet Take 600 mg by mouth daily.  . Cholecalciferol (VITAMIN D3) 2000 UNITS capsule Take 2,000 Units by mouth daily.  . diazepam (VALIUM) 5 MG tablet Take 1 tablet (5 mg total) by mouth every 12 (twelve) hours as needed for muscle spasms. Take instead of ALprazolam  . doxazosin (CARDURA) 2 MG tablet TK 1 T PO  BID  . fluorouracil (EFUDEX) 5 % cream   . gabapentin (NEURONTIN) 300 MG capsule TAKE 3 CAPSULES BY MOUTH  THREE TIMES DAILY, PLUS 2 CAPSULES EVERY DAY AS NEEDED  . HYDROcodone-acetaminophen (NORCO) 7.5-325 MG tablet Take 1 tablet by mouth every 6 (six) hours as needed for moderate pain.  . magnesium 30 MG tablet Take 30 mg by mouth 2 (two) times daily as needed.  . niacin 500 MG tablet Take 500 mg by mouth at bedtime.  . Omega-3 Fatty Acids (OMEGA-3 FISH OIL PO) Take 2 each by mouth every morning. EPA/DHA 520/350  . polyethylene glycol (MIRALAX / GLYCOLAX) packet Take 17 g by mouth daily as needed.  . psyllium (METAMUCIL) 58.6 % powder Take 1 packet by mouth daily. 1 Teaspoon daily  . S-Adenosylmethionine (SAM-E) 400 MG TABS Take 400 mg by mouth daily.  Marland Kitchen zinc gluconate 50 MG tablet Take 50 mg by mouth daily.  . [DISCONTINUED] ALPRAZolam (XANAX) 0.5 MG tablet Take 1 tablet (0.5 mg total) by mouth 3 (three) times daily as needed for anxiety.  . [DISCONTINUED] HYDROcodone-acetaminophen (NORCO) 7.5-325 MG tablet Take 1 tablet by mouth 3 (three) times daily.  . [DISCONTINUED] predniSONE (DELTASONE) 20 MG tablet 40 mg x3d, 30 mg x3d, 20 mg x2d, 10 mg x2d (Patient not taking: Reported on 06/04/2016)   No facility-administered encounter medications on file as of 06/04/2016.     Allergies (verified) Nsaids; Ciprofloxacin; Statins; Requip [ropinirole hcl]; and Ropinirole   History: Past Medical History:  Diagnosis Date  . Anemia   .  Anemia due to antineoplastic chemotherapy 08/15/2015  . Anxiety   . Arthritis   . Cancer ([facility])   . Cataract   . Chronic kidney disease (CKD), stage III (moderate) 02/09/2015   Controlled  . Combined fat and carbohydrate induced hyperlipemia 02/09/2015   Controlled  . Depression   . DLBCL (diffuse large B cell lymphoma) ([place] [facility]) 08/15/2015  . Essential (primary) hypertension 02/09/2015  . Gastro-esophageal reflux disease without esophagitis 02/09/2015   Controlled  . Neuromuscular disorder ([facility])   . Tinnitus    Past Surgical History:  Procedure Laterality Date  .  CATARACT EXTRACTION, BILATERAL    . COLONOSCOPY  May 2011  . CYSTOSCOPY    . TOTAL HIP ARTHROPLASTY  2003   Right   . TOTAL HIP ARTHROPLASTY  April, 2011   Left  . TRIGGER FINGER RELEASE  Nov 2013  . TURBINECTOMY  2006  . UPPER GI ENDOSCOPY  Nov 2015   Family History  Problem Relation Age of Onset  . Anuerysm Mother 75    Deceased  . Colon cancer Father 74    Deceased  . Colon cancer Brother   . Prostate cancer Brother     Deceased  . Alzheimer's disease Brother   . Heart disease Brother   . Alzheimer's disease Paternal Grandmother   . Early death Maternal Grandmother     Unknown  . Obesity Son   . Obesity Son     Gastric Bypass  . Healthy Son   . Healthy Daughter   . Diabetes Neg Hx    Social History   Occupational History  . Not on file.   Social History Main Topics  . Smoking status: Former Research scientist (life sciences)  . Smokeless tobacco: Never Used     Comment: Quit >50 years ago  . Alcohol use 0.6 - 2.4 oz/week    1 - 4 Shots of liquor per week  . Drug use: No  . Sexual activity: Not on file   Tobacco Counseling Counseling given: Not Answered   Activities of Daily Living In your present state of health, do you have any difficulty performing the following activities: 06/04/2016  Hearing? N  Vision? N  Difficulty concentrating or making decisions? N  Walking or climbing stairs? N  Dressing or bathing? N  Doing errands, shopping? N  Preparing Food and eating ? N  Using the Toilet? N  In the past six months, have you accidently leaked urine? N  Do you have problems with loss of bowel control? N  Managing your Medications? N  Managing your Finances? N  Housekeeping or managing your Housekeeping? N  Some recent data might be hidden    Immunizations and Health Maintenance Immunization History  Administered Date(s) Administered  . Influenza-Unspecified 03/15/2016  . Pneumococcal Conjugate-13 01/05/2014  . Pneumococcal Polysaccharide-23 12/23/2005  . Tdap 10/24/2010    . Zoster 07/14/2006   There are no preventive care reminders to display for this patient.  Patient Care Team: [name], MD as PCP - General (Family Medicine) [name], MD as Consulting Physician (Oncology) Garald Balding, MD as Consulting Physician (Orthopedic Surgery) Amy Kalman Drape, MD as Consulting Physician (Audiology) Mauricia Area, MD as Consulting Physician (Nephrology) Sheryn Bison, MD as Consulting Physician (Dermatology) Domingo Mend, DMD as Consulting Physician (Dentistry) Axel Filler, MD as Consulting Physician (Internal Medicine)  Indicate any recent Medical Services you may have received from other than [facility] providers in the past year (date may be approximate).  Assessment:   This is a routine wellness examination for [place]. Physical assessment deferred to PCP.  Pt concerns: Pt reports occasional leg weakness while walking. Pt states he gets cramps only if he does not take calcium/mag/zinc supplements. PCP is aware.  Hearing/Vision screen Hearing Screening Comments: Able to hear conversational tones w/o difficulty. Wearing bilateral hearing aids.  Vision Screening Comments: Wearing glasses today. Follows w/ eye doctor once yearly. Reports R eye gets tired and droops when he reads. He plans to follow-up w/ his eye doctor regarding this.  Dietary issues and exercise activities discussed: Current Exercise Habits: Home exercise routine, Time (Minutes): 30, Frequency (Times/Week): 3, Weekly Exercise (Minutes/Week): 90, Intensity: Moderate, Exercise limited by: orthopedic condition(s)   Diet (meal preparation, eat out, water intake, caffeinated beverages, dairy products, fruits and vegetables): in general, a "healthy" diet  , well balanced, on average, 3 meals per day. Prepares breakfast and lunch himself, eats dinner in [employer]. Drinks water and coffee. Trying to increase water intake.    Goals    . Lose 1 lb per  month      Depression Screen PHQ 2/9 Scores 06/04/2016 05/30/2016 05/21/2015  PHQ - 2 Score 0 0 0    Fall Risk Fall Risk  06/04/2016 05/30/2016 05/15/2016 04/06/2016 11/30/2015  Falls in the past year? No No No No No    Cognitive Function: MMSE - Mini Mental State Exam 06/04/2016  Orientation to time 5  Orientation to Place 5  Registration 3  Attention/ Calculation 4  Recall 1  Language- name 2 objects 2  Language- repeat 1  Language- follow 3 step command 3  Language- read & follow direction 1  Write a sentence 1  Copy design 1  Total score 27        Screening Tests Health Maintenance  Topic Date Due  . TETANUS/TDAP  10/23/2020  . INFLUENZA VACCINE  Addressed  . ZOSTAVAX  Completed  . PNA vac Low Risk Adult  Completed        Plan:   Follow-up w/ PCP as scheduled. Bring a copy of your advance directives to your next office visit. Continue to eat heart healthy diet (full of fruits, vegetables, whole grains, lean protein, water--limit salt, fat, and sugar intake) and increase physical activity as tolerated. Continue doing brain stimulating activities (puzzles, reading, adult coloring books, staying active) to keep memory sharp.   During the course of the visit Melik was educated and counseled about the following appropriate screening and preventive services:   Vaccines to include Pneumoccal, Influenza, Hepatitis B, Td, Zostavax, HCV  Cardiovascular disease screening  Diabetes screening  Glaucoma screening  Nutrition counseling   Patient Instructions (the written plan) were given to the patient.   Dorrene German, RN   06/04/2016    I have reviewed and agree with above J Copland MD 06/07/16

## 2016-06-04 NOTE — Progress Notes (Signed)
[place] at [facility] [address], [place], [place] [address] [phone]  Date:  06/04/2016   Name:  Jeremiah Walker   DOB:  June 24, 1928   MRN:  [id number]  PCP:  Leeanne Rio, PA-C    Chief Complaint: Medicare Wellness (Pt here for medicare wellness visit. Last cpe 05/11/2015.)   History of Present Illness:  Jeremiah Walker is a 82 y.o. very pleasant male patient who presents with the following:  Here today for a follow-up visit.   We also did his medicare wellness exam with RN today.   History of gout, CKD. He is feeling good today, has no current sx except for his usual back pain  He had diffuse large B cell lymphoma- this was dx in 2015 when he developed an enlarged supraclavicular node. He sees Dr. Marin Olp.  He underwent chemo for about 4 months, now they are in observation Next appt with Dr. 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 He would like to try and titrate down some.  He plans to decrease by one 300 mg pill every 1-2 weeks.  He sees Dr. Dannielle Burn for his CKD- they think this may be due to rhabdo from a statin.  He is no longer using a statin  His renal disease is stable, he is seen every 6 months Most recent creat 1.7, GFR about 35  He has BPH- this does not really bother him much He uses bethanacol as needed for urinary retention  He has chronic back pain- he just had an MRI of his lumbar spine a week ago  His ortho is at [place]- Dr. Durward Fortes IMPRESSION: 1. Mild S-shaped lumbar curvature, grade 1 L4-5 anterolisthesis, and prominent discogenic and facet arthropathy. No acute osseous abnormality. 2. Mild canal stenosis at L2-3 level.  No high-grade canal stenosis. 3. Multiple levels of foraminal narrowing moderate at left L2-3, moderate at right L3-4, and severe at right L4-5 levels. 4. Large marginal osteophytes with contact upon exiting left L2 nerve root at  the L2-3 level and right L3 nerve root at the L3-4 level. Right-sided foraminal L5-S1 synovial cyst measuring 5 mm with possible impingement on exiting right L5 nerve root.  He is able to stand up straight, but feels like there is a "fist" pressing into his left lower back  He has had a steroid injection in his spine in the past that was helpful for him.  He would be willing to do this again if suggested by Dr. Durward Fortes He takes hydrocodone 3 a day- I last refilled this on 11/20. He admits he has been taking this up to 4x a day sometimes when the pain is more sesver  He has RLS- he tried requip but did not like the way it made him feel, he stopped taking it.    He also takes xanax 2-3x a day He will take it at night if he cannot sleep, also uses it for his RLS- needs this refilled today  He lives at [facility], he really likes it there.  He has a 2 bd apt, with a balcony. 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   He has done quite a  bit of traveling overseas.    Flu shot is UTD, prevnar and pneumovax done, zostavax, tetanus UTD  One of his kids- a son- lives here, his grandson is an MD with [employer] as well, his wife is an MD as well.    Refill of hydrocodone done 11/21, xanax 11/13 Reviewed [place]- hydrocodone #90 11/21, 10/25, 9/25 Xanax #90 11/13, 10/14, 9/15 (#70) He has a Soil scientist on file, last UDS in April, low risk Patient Active Problem List   Diagnosis Date Noted  . Chronic pain syndrome 09/26/2015  . De Quervain's tenosynovitis, right 09/13/2015  . Anemia due to antineoplastic chemotherapy 08/15/2015  . DLBCL (diffuse large B cell lymphoma) ([place]) 08/15/2015  . Skin lesion of face 05/21/2015  . Peripheral neuropathy due to chemotherapy ([place]) 02/09/2015  . Benign prostatic  hyperplasia with urinary obstruction 02/09/2015  . Bilateral hearing loss 02/09/2015  . Low back pain 02/09/2015  . Chronic kidney disease (CKD), stage III (moderate) 02/09/2015  . Essential (primary) hypertension 02/09/2015  . Gastro-esophageal reflux disease without esophagitis 02/09/2015  . Anxiety, generalized 02/09/2015  . Cannot sleep 02/09/2015  . Combined fat and carbohydrate induced hyperlipemia 02/09/2015    Past Medical History:  Diagnosis Date  . Anemia   . Anemia due to antineoplastic chemotherapy 08/15/2015  . Anxiety   . Arthritis   . Cancer ([place])   . Cataract   . Chronic kidney disease (CKD), stage III (moderate) 02/09/2015   Controlled  . Combined fat and carbohydrate induced hyperlipemia 02/09/2015   Controlled  . Depression   . DLBCL (diffuse large B cell lymphoma) ([place]) 08/15/2015  . Essential (primary) hypertension 02/09/2015  . Gastro-esophageal reflux disease without esophagitis 02/09/2015   Controlled  . Neuromuscular disorder ([place])   . Tinnitus     Past Surgical History:  Procedure Laterality Date  . CATARACT EXTRACTION, BILATERAL    . COLONOSCOPY  May 2011  . CYSTOSCOPY    . TOTAL HIP ARTHROPLASTY  2003   Right   . TOTAL HIP ARTHROPLASTY  April, 2011   Left  . TRIGGER FINGER RELEASE  Nov 2013  . TURBINECTOMY  2006  . UPPER GI ENDOSCOPY  Nov 2015    Social History  Substance Use Topics  . Smoking status: Former Research scientist (life sciences)  . Smokeless tobacco: Never Used     Comment: Quit >50 years ago  . Alcohol use 0.6 - 2.4 oz/week    1 - 4 Shots of liquor per week    Family History  Problem Relation Age of Onset  . Anuerysm Mother 40    Deceased  . Colon cancer Father 55    Deceased  . Colon cancer Brother   . Prostate cancer Brother     Deceased  . Alzheimer's disease Brother   . Heart disease Brother   . Alzheimer's disease Paternal Grandmother   . Early death Maternal Grandmother     Unknown  . Obesity Son   . Obesity Son     Gastric Bypass   . Healthy Son   . Healthy Daughter   . Diabetes Neg Hx     Allergies  Allergen Reactions  . Nsaids Other (See Comments) and Tinitus    Bruising   . Ciprofloxacin     Other reaction(s): Other (See Comments) Achilles tendon  . Statins     Other reaction(s): Kidney issues Muscle pain  . Requip [Ropinirole Hcl] Anxiety  . Ropinirole Anxiety    Mood swing    Medication list has been  reviewed and updated.  Current Outpatient Prescriptions on File Prior to Visit  Medication Sig Dispense Refill  . ALPRAZolam (XANAX) 0.5 MG tablet Take 1 tablet (0.5 mg total) by mouth 3 (three) times daily as needed for anxiety. 90 tablet 1  . amoxicillin (AMOXIL) 500 MG capsule Take 4 capsules (2,000 mg total) by mouth as directed. ONE HOUR PRIOR TO DENTAL PROCEDURES 4 capsule 1  . bethanechol (URECHOLINE) 25 MG tablet TAKE 1 TABLET BY MOUTH THREE TIMES DAILY 90 tablet 2  . Calcium 500 MG tablet Take 600 mg by mouth daily.    . Cholecalciferol (VITAMIN D3) 2000 UNITS capsule Take 2,000 Units by mouth daily.    . diazepam (VALIUM) 5 MG tablet Take 1 tablet (5 mg total) by mouth every 12 (twelve) hours as needed for muscle spasms. Take instead of ALprazolam 10 tablet 1  . doxazosin (CARDURA) 2 MG tablet TK 1 T PO  BID 180 tablet 1  . fluorouracil (EFUDEX) 5 % cream     . gabapentin (NEURONTIN) 300 MG capsule TAKE 3 CAPSULES BY MOUTH THREE TIMES DAILY, PLUS 2 CAPSULES EVERY DAY AS NEEDED 80 capsule 0  . HYDROcodone-acetaminophen (NORCO) 7.5-325 MG tablet Take 1 tablet by mouth 3 (three) times daily. 90 tablet 0  . magnesium 30 MG tablet Take 30 mg by mouth 2 (two) times daily as needed.    . niacin 500 MG tablet Take 500 mg by mouth at bedtime.    . Omega-3 Fatty Acids (OMEGA-3 FISH OIL PO) Take 2 each by mouth every morning. EPA/DHA 520/350    . polyethylene glycol (MIRALAX / GLYCOLAX) packet Take 17 g by mouth daily as needed.    . psyllium (METAMUCIL) 58.6 % powder Take 1 packet by mouth daily. 1  Teaspoon daily    . S-Adenosylmethionine (SAM-E) 400 MG TABS Take 400 mg by mouth daily.    Marland Kitchen zinc gluconate 50 MG tablet Take 50 mg by mouth daily.     No current facility-administered medications on file prior to visit.     Review of Systems:  As per HPI- otherwise negative.   Physical Examination: Vitals:   06/04/16 1412  BP: (!) 122/51  Pulse: 65  Temp: 97.5 F (36.4 C)   Vitals:   06/04/16 1412  Weight: 225 lb 6.4 oz (102.2 kg)  Height: 5' 9.5" (1.765 m)   Body mass index is 32.81 kg/m. Ideal Body Weight: Weight in (lb) to have BMI = 25: 171.4   BP Readings from Last 3 Encounters:  06/04/16 (!) 122/51  05/30/16 120/60  05/21/16 122/68     GEN: WDWN, NAD, Non-toxic, A & O x 3, looks well, overweight HEENT: Atraumatic, Normocephalic. Neck supple. No masses, No LAD.  Bilateral TM wnl, oropharynx normal.  PEERL,EOMI.   Ears and Nose: No external deformity. CV: RRR, No M/G/R. No JVD. No thrill. No extra heart sounds. PULM: CTA B, no wheezes, crackles, rhonchi. No retractions. No resp. distress. No accessory muscle use. EXTR: No c/c/e NEURO Normal gait.  PSYCH: Normally interactive. Conversant. Not depressed or anxious appearing.  Calm demeanor.    Assessment and Plan: Restless legs - Plan: ALPRAZolam (XANAX) 0.5 MG tablet  Chronic pain syndrome - Plan: HYDROcodone-acetaminophen (NORCO) 7.5-325 MG tablet  Persistent insomnia - Plan: ALPRAZolam (XANAX) 0.5 MG tablet  Encounter for Medicare annual wellness exam  Here today to follow-up on a few concerns Refilled his pain medication, he is seeing or orthopedist to discuss his back pain tomorrow He  is gradually decreasing his Neurontin Refilled his xanax that he takes up to TID Refilled his hydrocodone- changed sig to every 6 hours and gave 10 more pills per 30 days to reflect what he is currently taking Asked him to see me in about 4 months    Signed Lamar Blinks, MD

## 2016-06-04 NOTE — Progress Notes (Signed)
Pre visit review using our clinic review tool, if applicable. No additional management support is needed unless otherwise documented below in the visit note. 

## 2016-06-05 ENCOUNTER — Encounter (INDEPENDENT_AMBULATORY_CARE_PROVIDER_SITE_OTHER): Payer: Self-pay | Admitting: Orthopaedic Surgery

## 2016-06-05 ENCOUNTER — Ambulatory Visit (INDEPENDENT_AMBULATORY_CARE_PROVIDER_SITE_OTHER): Payer: Medicare Other | Admitting: Orthopaedic Surgery

## 2016-06-05 VITALS — BP 126/62 | HR 73 | Ht 69.0 in | Wt 221.0 lb

## 2016-06-05 DIAGNOSIS — G8929 Other chronic pain: Secondary | ICD-10-CM | POA: Diagnosis not present

## 2016-06-05 DIAGNOSIS — M545 Low back pain, unspecified: Secondary | ICD-10-CM

## 2016-06-05 NOTE — Progress Notes (Signed)
Office Visit Note   Patient: Jeremiah Walker           Date of Birth: 16-May-1928           MRN: [id number] Visit Date: 06/05/2016              Requested by: Brunetta Jeans, PA-C [address] [place], [place] [address] PCP: Lamar Blinks, MD   Assessment & Plan: Visit Diagnoses: Chronic low back pain with diffuse osteoarthritis  Plan: Lumbar support from [employer], evaluation by Dr. [name] in follow-up in 1 month's  Follow-Up Instructions: No Follow-up on file.   Orders:  No orders of the defined types were placed in this encounter.  No orders of the defined types were placed in this encounter.     Procedures: No procedures performed   Clinical Data: No additional findings.  MRI scan demonstrates a grade 1 L4-5 anterior listhesis with facet arthropathy. There was mild canal stenosis at L2-3 multiple levels of foraminal narrowing moderate on the left at L2-3, moderate at right at L3-4 and severe on the right at L4-5 there was a right-sided foraminal L5-S1 synovial cyst with possible impingement on the exiting right L5 nerve root. He is not experiencing radicular pain Subjective: No chief complaint on file.   Pt here today for MRI results of lumbar.  [name] relates a chronic history of low back pain. He has been on hydrocodone 7.5 mg up to 4 times a day for at least 3 years. He moved to this area about 15 months ago and it is been followed by his primary care physician area in the past he's had cortisone injections while living in [place] with significant relief of his pain. On this occasion he experienced rather acute onset of pain without history of injury or trauma 3-4 weeks ago. He does live at [facility] exercises routinely. This has helped. He is not experiencing radicular pain  Review of Systems   Objective: Vital Signs: Ht 5\' 9"  (1.753 m)   Wt 221 lb (100.2 kg)   BMI 32.64 kg/m   Physical Exam  Ortho Exam straight leg raise is negative  bilaterally. Neurovascular exam is intact. No significant percussible tenderness of the lumbar spine or either flank. Painless range of motion of both hips with internal and external rotation  Specialty Comments:  No specialty comments available.  Imaging: No results found.   PMFS History: Patient Active Problem List   Diagnosis Date Noted  . Chronic pain syndrome 09/26/2015  . De Quervain's tenosynovitis, right 09/13/2015  . Anemia due to antineoplastic chemotherapy 08/15/2015  . DLBCL (diffuse large B cell lymphoma) ([place]) 08/15/2015  . Skin lesion of face 05/21/2015  . Peripheral neuropathy due to chemotherapy ([place]) 02/09/2015  . Benign prostatic hyperplasia with urinary obstruction 02/09/2015  . Bilateral hearing loss 02/09/2015  . Low back pain 02/09/2015  . Chronic kidney disease (CKD), stage III (moderate) 02/09/2015  . Essential (primary) hypertension 02/09/2015  . Gastro-esophageal reflux disease without esophagitis 02/09/2015  . Anxiety, generalized 02/09/2015  . Cannot sleep 02/09/2015  . Combined fat and carbohydrate induced hyperlipemia 02/09/2015   Past Medical History:  Diagnosis Date  . Anemia   . Anemia due to antineoplastic chemotherapy 08/15/2015  . Anxiety   . Arthritis   . Cancer ([place])   . Cataract   . Chronic kidney disease (CKD), stage III (moderate) 02/09/2015   Controlled  . Combined fat and carbohydrate induced hyperlipemia 02/09/2015   Controlled  .  Depression   . DLBCL (diffuse large B cell lymphoma) ([place]) 08/15/2015  . Essential (primary) hypertension 02/09/2015  . Gastro-esophageal reflux disease without esophagitis 02/09/2015   Controlled  . Neuromuscular disorder ([place])   . Tinnitus     Family History  Problem Relation Age of Onset  . Anuerysm Mother 39    Deceased  . Colon cancer Father 93    Deceased  . Colon cancer Brother   . Prostate cancer Brother     Deceased  . Alzheimer's disease Brother   . Heart disease Brother   .  Alzheimer's disease Paternal Grandmother   . Early death Maternal Grandmother     Unknown  . Obesity Son   . Obesity Son     Gastric Bypass  . Healthy Son   . Healthy Daughter   . Diabetes Neg Hx     Past Surgical History:  Procedure Laterality Date  . CATARACT EXTRACTION, BILATERAL    . COLONOSCOPY  May 2011  . CYSTOSCOPY    . TOTAL HIP ARTHROPLASTY  2003   Right   . TOTAL HIP ARTHROPLASTY  April, 2011   Left  . TRIGGER FINGER RELEASE  Nov 2013  . TURBINECTOMY  2006  . UPPER GI ENDOSCOPY  Nov 2015   Social History   Occupational History  . Not on file.   Social History Main Topics  . Smoking status: Former Research scientist (life sciences)  . Smokeless tobacco: Never Used     Comment: Quit >50 years ago  . Alcohol use 0.6 - 2.4 oz/week    1 - 4 Shots of liquor per week  . Drug use: No  . Sexual activity: Not on file

## 2016-06-08 ENCOUNTER — Ambulatory Visit (INDEPENDENT_AMBULATORY_CARE_PROVIDER_SITE_OTHER): Payer: Medicare Other | Admitting: Orthopaedic Surgery

## 2016-06-27 ENCOUNTER — Other Ambulatory Visit: Payer: Self-pay | Admitting: Physician Assistant

## 2016-07-09 ENCOUNTER — Ambulatory Visit (INDEPENDENT_AMBULATORY_CARE_PROVIDER_SITE_OTHER): Payer: Medicare Other | Admitting: Orthopaedic Surgery

## 2016-07-09 ENCOUNTER — Other Ambulatory Visit: Payer: Self-pay | Admitting: Family Medicine

## 2016-07-09 ENCOUNTER — Other Ambulatory Visit: Payer: Self-pay | Admitting: Emergency Medicine

## 2016-07-09 VITALS — BP 147/62 | HR 72 | Ht 69.0 in | Wt 221.0 lb

## 2016-07-09 DIAGNOSIS — G8929 Other chronic pain: Secondary | ICD-10-CM

## 2016-07-09 DIAGNOSIS — M545 Low back pain: Secondary | ICD-10-CM | POA: Diagnosis not present

## 2016-07-09 DIAGNOSIS — G4700 Insomnia, unspecified: Secondary | ICD-10-CM

## 2016-07-09 DIAGNOSIS — G2581 Restless legs syndrome: Secondary | ICD-10-CM

## 2016-07-09 MED ORDER — ALPRAZOLAM 0.5 MG PO TABS: 0.5000 mg | ORAL_TABLET | Freq: Three times a day (TID) | ORAL | 1 refills | Status: DC | PRN

## 2016-07-09 NOTE — Telephone Encounter (Signed)
Reviewed NCCSR- nothing unexpected

## 2016-07-09 NOTE — Progress Notes (Signed)
Office Visit Note   Patient: Jeremiah Walker           Date of Birth: 1928-02-07           MRN: [id number] Visit Date: 07/09/2016              Requested by: Jeremiah Mclean, MD [address] [place], [place] [address] PCP: Jeremiah Blinks, MD   Assessment & Plan: Visit Diagnoses: Chronic low back pain with evidence of osteoarthritis and some spinal stenosis. Jeremiah Walker has a combination of pain including low back and subjective weakness of both lower extremities.  Plan: Doing well with lumbar support for just at [employer]. Continue with exercises at [facility]. Have Jeremiah Walker evaluate for possible epidural steroid injection.  Follow-Up Instructions: No Follow-up on file.   Orders:  No orders of the defined types were placed in this encounter.  No orders of the defined types were placed in this encounter.     Procedures: No procedures performed   Clinical Data: No additional findings.   Subjective: No chief complaint on file.   Pt states he is doing well today.   Jeremiah Walker did purchase a lumbar support at [employer] and notes it's made a big difference. He feels like he is much more active.Marland Kitchen He does take hydrocodone up to 3 times a day per his primary care physician, Jeremiah Walker. He experiences mostly back pain but certainly experiences some weakness of both lower extremities consistent with a spinal stenosis identified per MRI scan. He is performing stretching exercises.  Review of Systems   Objective: Vital Signs: Ht 5\' 9"  (1.753 m)   Wt 221 lb (100.2 kg)   BMI 32.64 kg/m   Physical Exam  Ortho Exam straight leg raise negative bilaterally. Patient walks without a limp. No percussible tenderness of the lumbar spine. Good pulses. No lower extremity swelling. Painless range of motion of both hips and both knees.  Specialty Comments:  No specialty comments available.  Imaging: No results found.   PMFS History: Patient Active Problem List     Diagnosis Date Noted  . Chronic pain syndrome 09/26/2015  . De Quervain's tenosynovitis, right 09/13/2015  . Anemia due to antineoplastic chemotherapy 08/15/2015  . DLBCL (diffuse large B cell lymphoma) ([place]) 08/15/2015  . Skin lesion of face 05/21/2015  . Peripheral neuropathy due to chemotherapy ([place]) 02/09/2015  . Benign prostatic hyperplasia with urinary obstruction 02/09/2015  . Bilateral hearing loss 02/09/2015  . Low back pain 02/09/2015  . Chronic kidney disease (CKD), stage III (moderate) 02/09/2015  . Essential (primary) hypertension 02/09/2015  . Gastro-esophageal reflux disease without esophagitis 02/09/2015  . Anxiety, generalized 02/09/2015  . Cannot sleep 02/09/2015  . Combined fat and carbohydrate induced hyperlipemia 02/09/2015   Past Medical History:  Diagnosis Date  . Anemia   . Anemia due to antineoplastic chemotherapy 08/15/2015  . Anxiety   . Arthritis   . Cancer ([place])   . Cataract   . Chronic kidney disease (CKD), stage III (moderate) 02/09/2015   Controlled  . Combined fat and carbohydrate induced hyperlipemia 02/09/2015   Controlled  . Depression   . DLBCL (diffuse large B cell lymphoma) ([place]) 08/15/2015  . Essential (primary) hypertension 02/09/2015  . Gastro-esophageal reflux disease without esophagitis 02/09/2015   Controlled  . Neuromuscular disorder ([place])   . Tinnitus     Family History  Problem Relation Age of Onset  . Anuerysm Mother 53    Deceased  . Colon  cancer Father 23    Deceased  . Colon cancer Brother   . Prostate cancer Brother     Deceased  . Alzheimer's disease Brother   . Heart disease Brother   . Alzheimer's disease Paternal Grandmother   . Early death Maternal Grandmother     Unknown  . Obesity Son   . Obesity Son     Gastric Bypass  . Healthy Son   . Healthy Daughter   . Diabetes Neg Hx     Past Surgical History:  Procedure Laterality Date  . CATARACT EXTRACTION, BILATERAL    . COLONOSCOPY  May 2011  .  CYSTOSCOPY    . TOTAL HIP ARTHROPLASTY  2003   Right   . TOTAL HIP ARTHROPLASTY  April, 2011   Left  . TRIGGER FINGER RELEASE  Nov 2013  . TURBINECTOMY  2006  . UPPER GI ENDOSCOPY  Nov 2015   Social History   Occupational History  . Not on file.   Social History Main Topics  . Smoking status: Former Research scientist (life sciences)  . Smokeless tobacco: Never Used     Comment: Quit >50 years ago  . Alcohol use 0.6 - 2.4 oz/week    1 - 4 Shots of liquor per week  . Drug use: No  . Sexual activity: Not on file

## 2016-07-09 NOTE — Telephone Encounter (Signed)
Patient is requesting a refill of ALPRAZolam (XANAX) 0.5 MG tablet Please advise   Pharmacy: [facility], [place] - [id number]

## 2016-07-10 ENCOUNTER — Encounter (INDEPENDENT_AMBULATORY_CARE_PROVIDER_SITE_OTHER): Payer: Self-pay | Admitting: Orthopaedic Surgery

## 2016-07-10 NOTE — Telephone Encounter (Signed)
Yes to referral to Dr Dickey Gave

## 2016-07-11 ENCOUNTER — Encounter: Payer: Self-pay | Admitting: Family Medicine

## 2016-07-13 ENCOUNTER — Other Ambulatory Visit (INDEPENDENT_AMBULATORY_CARE_PROVIDER_SITE_OTHER): Payer: Self-pay

## 2016-07-13 ENCOUNTER — Other Ambulatory Visit: Payer: Self-pay | Admitting: Internal Medicine

## 2016-07-13 DIAGNOSIS — G8929 Other chronic pain: Secondary | ICD-10-CM

## 2016-07-13 DIAGNOSIS — G894 Chronic pain syndrome: Secondary | ICD-10-CM

## 2016-07-13 DIAGNOSIS — M545 Low back pain: Principal | ICD-10-CM

## 2016-07-13 MED ORDER — HYDROCODONE-ACETAMINOPHEN 7.5-325 MG PO TABS: 1.0000 | ORAL_TABLET | Freq: Four times a day (QID) | ORAL | 0 refills | Status: DC | PRN

## 2016-07-13 NOTE — Telephone Encounter (Signed)
Jeremiah Walker Pt Tel [phone]  Pt came in office stating that will be out of his meds by tomorrow and is needing Prescription for today the lastest for  HYDROcodone-acetaminophen ([place]) 7.5-325 MG tablet. Please advise ASAP.

## 2016-07-13 NOTE — Telephone Encounter (Signed)
Last filled:  06/04/16 Amt: 100, 0 Last OV:  06/04/16 10/19/15 uds sample given, low risk next screen 04/19/16.   Dr. Lorelei Pont is out of the office and patient is here.  Please advise in her absence.

## 2016-07-13 NOTE — Telephone Encounter (Signed)
Patient calling back checking on the status of Rx mentioned below, patient states he will run out tomorrow and be in pain on Sunday if Rx is not received, please advise

## 2016-07-13 NOTE — Progress Notes (Unsigned)
Office Visit Note   Patient: Jeremiah Walker           Date of Birth: Nov 04, 1927           MRN: [id number] Visit Date: 07/13/2016              Requested by: No referring provider defined for this encounter. PCP: Lamar Blinks, MD   Assessment & Plan: Visit Diagnoses: No diagnosis found.  Plan: ***  Follow-Up Instructions: No Follow-up on file.   Orders:  No orders of the defined types were placed in this encounter.  No orders of the defined types were placed in this encounter.     Procedures: No procedures performed   Clinical Data: No additional findings.   Subjective: No chief complaint on file.   HPI  Review of Systems   Objective: Vital Signs: There were no vitals taken for this visit.  Physical Exam  Ortho Exam  Specialty Comments:  No specialty comments available.  Imaging: No results found.   PMFS History: Patient Active Problem List   Diagnosis Date Noted  . Chronic pain syndrome 09/26/2015  . De Quervain's tenosynovitis, right 09/13/2015  . Anemia due to antineoplastic chemotherapy 08/15/2015  . DLBCL (diffuse large B cell lymphoma) ([place]) 08/15/2015  . Skin lesion of face 05/21/2015  . Peripheral neuropathy due to chemotherapy ([place]) 02/09/2015  . Benign prostatic hyperplasia with urinary obstruction 02/09/2015  . Bilateral hearing loss 02/09/2015  . Low back pain 02/09/2015  . Chronic kidney disease (CKD), stage III (moderate) 02/09/2015  . Essential (primary) hypertension 02/09/2015  . Gastro-esophageal reflux disease without esophagitis 02/09/2015  . Anxiety, generalized 02/09/2015  . Cannot sleep 02/09/2015  . Combined fat and carbohydrate induced hyperlipemia 02/09/2015   Past Medical History:  Diagnosis Date  . Anemia   . Anemia due to antineoplastic chemotherapy 08/15/2015  . Anxiety   . Arthritis   . Cancer ([place])   . Cataract   . Chronic kidney disease (CKD), stage III (moderate) 02/09/2015   Controlled  .  Combined fat and carbohydrate induced hyperlipemia 02/09/2015   Controlled  . Depression   . DLBCL (diffuse large B cell lymphoma) ([place]) 08/15/2015  . Essential (primary) hypertension 02/09/2015  . Gastro-esophageal reflux disease without esophagitis 02/09/2015   Controlled  . Neuromuscular disorder ([place])   . Tinnitus     Family History  Problem Relation Age of Onset  . Anuerysm Mother 6    Deceased  . Colon cancer Father 76    Deceased  . Colon cancer Brother   . Prostate cancer Brother     Deceased  . Alzheimer's disease Brother   . Heart disease Brother   . Alzheimer's disease Paternal Grandmother   . Early death Maternal Grandmother     Unknown  . Obesity Son   . Obesity Son     Gastric Bypass  . Healthy Son   . Healthy Daughter   . Diabetes Neg Hx     Past Surgical History:  Procedure Laterality Date  . CATARACT EXTRACTION, BILATERAL    . COLONOSCOPY  May 2011  . CYSTOSCOPY    . TOTAL HIP ARTHROPLASTY  2003   Right   . TOTAL HIP ARTHROPLASTY  April, 2011   Left  . TRIGGER FINGER RELEASE  Nov 2013  . TURBINECTOMY  2006  . UPPER GI ENDOSCOPY  Nov 2015   Social History   Occupational History  . Not on file.   Social History Main Topics  .  Smoking status: Former Research scientist (life sciences)  . Smokeless tobacco: Never Used     Comment: Quit >50 years ago  . Alcohol use 0.6 - 2.4 oz/week    1 - 4 Shots of liquor per week  . Drug use: No  . Sexual activity: Not on file

## 2016-07-19 ENCOUNTER — Encounter (INDEPENDENT_AMBULATORY_CARE_PROVIDER_SITE_OTHER): Payer: Self-pay | Admitting: Physical Medicine and Rehabilitation

## 2016-07-19 ENCOUNTER — Ambulatory Visit (INDEPENDENT_AMBULATORY_CARE_PROVIDER_SITE_OTHER): Payer: Medicare Other | Admitting: Physical Medicine and Rehabilitation

## 2016-07-19 VITALS — BP 114/48 | HR 62

## 2016-07-19 DIAGNOSIS — G620 Drug-induced polyneuropathy: Secondary | ICD-10-CM | POA: Diagnosis not present

## 2016-07-19 DIAGNOSIS — G894 Chronic pain syndrome: Secondary | ICD-10-CM

## 2016-07-19 DIAGNOSIS — M47816 Spondylosis without myelopathy or radiculopathy, lumbar region: Secondary | ICD-10-CM

## 2016-07-19 DIAGNOSIS — T451X5A Adverse effect of antineoplastic and immunosuppressive drugs, initial encounter: Secondary | ICD-10-CM

## 2016-07-19 NOTE — Progress Notes (Signed)
KNUT RONDINELLI - 83 y.o. male MRN [id number]  Date of birth: [birth year]  Office Visit Note: Visit Date: 07/19/2016 PCP: Lamar Blinks, MD Referred by: [name], MD  Subjective: Chief Complaint  Patient presents with  . Lower Back - Pain   HPI: [name] is an 83 year old gentleman who is followed by Dr. Durward Fortes. He reports chronic history of low back pain for many years. He reports centralized low back pain at about the lumbosacral junction worse when getting from a sitting to a standing position and worse with standing. It's better at rest. He has some pain with twisting. He says the pain starts in the middle and is more of a burning quality and it spreads out word to the sides. He does not have any pain down the legs or in the hips. He has no numbness or tingling shooting down the legs but he does have numbness and tingling in the feet and hands. He had chemotherapy and diagnosed peripheral neuropathy. He reports prior injections in [place] several years ago. He says he had a series of 3 which were done 1 week apart. He is unsure exactly what they did. He has not had prior back surgery. His history is complicated by prior bilateral hip replacements. He also has generalized anxiety and depression. He does reside at [employer] and does exercises in the gym there. He states that he can do squats fairly well with no problems and then he can't stand or walk a distance. He does ambulate with a cane. He says it can come and go in terms of intensity. He has tried medications and therapy without results. He denies specific trauma or focal weakness or bowel or bladder dysfunction. He's had no fevers chills or night sweats. MRI of the lumbar spine was performed last year and has shown below. He takes hydrocodone 7.5 mg during the day. He also has prescriptions for Xanax and Valium. He also takes gabapentin 300 mg 3 times per day. This was given to him for his neuropathy.     Review of  Systems  Constitutional: Negative for chills, fever, malaise/fatigue and weight loss.  HENT: Negative for hearing loss and sinus pain.   Eyes: Negative for blurred vision, double vision and photophobia.  Respiratory: Negative for cough and shortness of breath.   Cardiovascular: Negative for chest pain, palpitations and leg swelling.  Gastrointestinal: Negative for abdominal pain, nausea and vomiting.  Genitourinary: Negative for flank pain.  Musculoskeletal: Positive for back pain and joint pain. Negative for myalgias.  Skin: Negative for itching and rash.  Neurological: Positive for weakness. Negative for tremors and focal weakness.  Endo/Heme/Allergies: Negative.   Psychiatric/Behavioral: Negative for depression.  All other systems reviewed and are negative.  Otherwise per HPI.  Assessment & Plan: Visit Diagnoses:  1. Spondylosis without myelopathy or radiculopathy, lumbar region   2. Chronic pain syndrome   3. Peripheral neuropathy due to chemotherapy [facility])     Plan: Findings:  Chronic worsening axial low back pain that he feels is different than it was many years ago but still on the same area. He reports pain across the lower back worse with standing and twisting. He does have MRI evidence of facet arthropathy particularly at L4-5. He has no central canal stenosis. He also has a facet joint cyst on the right at L5-S1 also indicating significant facet arthropathy. He does have various levels of foraminal narrowing but no pain down the legs. His case is complicated by history  of bilateral hip replacements as well as depression and anxiety and medication management including hydrocodone as well as combination with benzodiazepines. I do believe most of his back pain is mechanical and facet mediated low back pain. I think the best approach is facet joint blocks at L4-5 and maybe L5-S1. Tingling on the relief he gets with that we could look at radiofrequency ablation or double block paradigm  with medial branch blocks of the same area and then radiofrequency ablation. He is currently exercising at [facility] has had physical therapy but regrouping with a physical therapist may be an issue as well long-term. We did discuss activity modification and exercises with him. I would not take over any medication management for him at this point given the high risk medications of Valium Xanax and hydrocodone and starting on board. I spent more than 25 minutes speaking face-to-face with the patient with 50% of the time in counseling.    Meds & Orders: No orders of the defined types were placed in this encounter.  No orders of the defined types were placed in this encounter.   Follow-up: Return for Facet joint blocks at L4-5 and maybe L5-S1.   Procedures: No procedures performed  No notes on file   Clinical History: Lumbar spine MRI dated 05/29/2016   L4-5: Anterolisthesis, uncovered disc with small bulge, central protrusion, moderate facet and ligamentum flavum hypertrophy, and large right foraminal and extra foraminal marginal osteophyte. Severe right foraminal narrowing. Mild left foraminal narrowing. Effacement of lateral recesses with contact upon the descending right L5 nerve root. No significant canal stenosis.   L5-S1: Small disc bulge and mild facet/ligamentum flavum hypertrophy. Mild bilateral foraminal narrowing. Right-sided 5 mm T2 hyperintense structure arising from the right facet joint possibly impinging the exiting right L5 nerve root likely a synovial cyst  Mild S-shaped lumbar curvature, grade 1 L4-5 anterolisthesis, and prominent discogenic and facet arthropathy. No acute osseous abnormality.  No high-grade canal stenosis. 3. Multiple levels of foraminal narrowing moderate at left L2-3, moderate at right L3-4, and severe at right L4-5 levels. 4. Large marginal osteophytes with contact upon exiting left L2 nerve root at the L2-3 level and right L3 nerve root at  the L3-4 level.   He reports that he has quit smoking. He has never used smokeless tobacco.   Recent Labs  05/02/16 1332  LABURIC 7.7    Objective:  VS:  HT:    WT:   BMI:     BP:(!) 114/48  HR:62bpm  TEMP: ( )  RESP:  Physical Exam  Constitutional: He is oriented to person, place, and time. He appears well-developed and well-nourished. No distress.  HENT:  Head: Normocephalic and atraumatic.  Nose: Nose normal.  Mouth/Throat: Oropharynx is clear and moist.  Eyes: Conjunctivae are normal. Pupils are equal, round, and reactive to light.  Neck: Normal range of motion. Neck supple.  Cardiovascular: Regular rhythm and intact distal pulses.   Pulmonary/Chest: Effort normal and breath sounds normal.  Abdominal: Soft. He exhibits no distension.  Musculoskeletal: He exhibits no deformity.  The patient ambulates with a cane with a forward flexed spine. He has difficulty rising from a seated position he gives a pretty acute pain across lower back. He has concordant pain with extension rotation of the lower back as well. No pain with hip rotation. No pain over the greater trochanters. He has good distal strength without clonus.  Neurological: He is alert and oriented to person, place, and time. No sensory deficit.  He exhibits normal muscle tone. Coordination normal.  Skin: Skin is warm. No rash noted.  Psychiatric: He has a normal mood and affect. His behavior is normal.  Nursing note and vitals reviewed.   Ortho Exam Imaging: No results found.  Past Medical/Family/Surgical/Social History: Medications & Allergies reviewed per EMR Patient Active Problem List   Diagnosis Date Noted  . Chronic pain syndrome 09/26/2015  . De Quervain's tenosynovitis, right 09/13/2015  . Anemia due to antineoplastic chemotherapy 08/15/2015  . DLBCL (diffuse large B cell lymphoma) ([place]) 08/15/2015  . Skin lesion of face 05/21/2015  . Peripheral neuropathy due to chemotherapy ([place]) 02/09/2015  . Benign  prostatic hyperplasia with urinary obstruction 02/09/2015  . Bilateral hearing loss 02/09/2015  . Low back pain 02/09/2015  . Chronic kidney disease (CKD), stage III (moderate) 02/09/2015  . Essential (primary) hypertension 02/09/2015  . Gastro-esophageal reflux disease without esophagitis 02/09/2015  . Anxiety, generalized 02/09/2015  . Cannot sleep 02/09/2015  . Combined fat and carbohydrate induced hyperlipemia 02/09/2015   Past Medical History:  Diagnosis Date  . Anemia   . Anemia due to antineoplastic chemotherapy 08/15/2015  . Anxiety   . Arthritis   . Cancer ([place])   . Cataract   . Chronic kidney disease (CKD), stage III (moderate) 02/09/2015   Controlled  . Combined fat and carbohydrate induced hyperlipemia 02/09/2015   Controlled  . Depression   . DLBCL (diffuse large B cell lymphoma) ([place]) 08/15/2015  . Essential (primary) hypertension 02/09/2015  . Gastro-esophageal reflux disease without esophagitis 02/09/2015   Controlled  . Neuromuscular disorder ([place])   . Tinnitus    Family History  Problem Relation Age of Onset  . Anuerysm Mother 84    Deceased  . Colon cancer Father 49    Deceased  . Colon cancer Brother   . Prostate cancer Brother     Deceased  . Alzheimer's disease Brother   . Heart disease Brother   . Alzheimer's disease Paternal Grandmother   . Early death Maternal Grandmother     Unknown  . Obesity Son   . Obesity Son     Gastric Bypass  . Healthy Son   . Healthy Daughter   . Diabetes Neg Hx    Past Surgical History:  Procedure Laterality Date  . CATARACT EXTRACTION, BILATERAL    . COLONOSCOPY  May 2011  . CYSTOSCOPY    . TOTAL HIP ARTHROPLASTY  2003   Right   . TOTAL HIP ARTHROPLASTY  April, 2011   Left  . TRIGGER FINGER RELEASE  Nov 2013  . TURBINECTOMY  2006  . UPPER GI ENDOSCOPY  Nov 2015   Social History   Occupational History  . Not on file.   Social History Main Topics  . Smoking status: Former Research scientist (life sciences)  . Smokeless tobacco:  Never Used     Comment: Quit >50 years ago  . Alcohol use 0.6 - 2.4 oz/week    1 - 4 Shots of liquor per week  . Drug use: No  . Sexual activity: Not on file

## 2016-07-20 DIAGNOSIS — H02403 Unspecified ptosis of bilateral eyelids: Secondary | ICD-10-CM | POA: Diagnosis not present

## 2016-07-30 ENCOUNTER — Encounter (INDEPENDENT_AMBULATORY_CARE_PROVIDER_SITE_OTHER): Payer: Self-pay | Admitting: Physical Medicine and Rehabilitation

## 2016-07-30 ENCOUNTER — Ambulatory Visit (INDEPENDENT_AMBULATORY_CARE_PROVIDER_SITE_OTHER): Payer: Medicare Other | Admitting: Physical Medicine and Rehabilitation

## 2016-07-30 ENCOUNTER — Ambulatory Visit (INDEPENDENT_AMBULATORY_CARE_PROVIDER_SITE_OTHER): Payer: Self-pay

## 2016-07-30 VITALS — BP 133/64 | HR 69

## 2016-07-30 DIAGNOSIS — M545 Low back pain, unspecified: Secondary | ICD-10-CM

## 2016-07-30 DIAGNOSIS — M47816 Spondylosis without myelopathy or radiculopathy, lumbar region: Secondary | ICD-10-CM | POA: Diagnosis not present

## 2016-07-30 DIAGNOSIS — G8929 Other chronic pain: Secondary | ICD-10-CM

## 2016-07-30 MED ORDER — METHYLPREDNISOLONE ACETATE 80 MG/ML IJ SUSP: 80.0000 mg | Freq: Once | INTRAMUSCULAR | Status: DC

## 2016-07-30 MED ORDER — BUPIVACAINE HCL 0.5 % IJ SOLN: 50.0000 mL | Freq: Once | INTRAMUSCULAR | Status: DC

## 2016-07-30 MED ORDER — LIDOCAINE HCL (PF) 1 % IJ SOLN: 0.3300 mL | Freq: Once | INTRAMUSCULAR | Status: DC

## 2016-07-30 NOTE — Patient Instructions (Signed)

## 2016-07-30 NOTE — Procedures (Signed)
Lumbar Facet Joint Intra-Articular Injection(s) with Fluoroscopic Guidance  Patient: Jeremiah Walker      Date of Birth: 14-Dec-1927 MRN: [id number] PCP: Lamar Blinks, MD      Visit Date: 07/30/2016   Universal Protocol:    Date/Time: 02/05/182:01 PM  Consent Given By: the patient  Position: PRONE   Additional Comments: Vital signs were monitored before and after the procedure. Patient was prepped and draped in the usual sterile fashion. The correct patient, procedure, and site was verified.   Injection Procedure Details:  Procedure Site One Meds Administered:  Meds ordered this encounter  Medications  . lidocaine (PF) (XYLOCAINE) 1 % injection 0.3 mL  . bupivacaine (MARCAINE) 0.5 % (with pres) injection 50 mL  . methylPREDNISolone acetate (DEPO-MEDROL) injection 80 mg     Laterality: Bilateral  Location/Site:  L4-L5  Needle size: 22 guage  Needle type: Spinal  Needle Placement: Articular  Findings:  -Contrast Used: 1 mL iohexol 180 mg iodine/mL   -Comments: Excellent flow of contrast producing a partial arthrogram.  Procedure Details: The fluoroscope beam is vertically oriented in AP, and the inferior recess is visualized beneath the lower pole of the inferior apophyseal process, which represents the target point for needle insertion. When direct visualization is difficult the target point is located at the medial projection of the vertebral pedicle. The region overlying each aforementioned target is locally anesthetized with a 1 to 2 ml. volume of 1% Lidocaine without Epinephrine.   The spinal needle was inserted into each of the above mentioned facet joints using biplanar fluoroscopic guidance. A 0.25 to 0.5 ml. volume of Isovue-250 was injected and a partial facet joint arthrogram was obtained. A single spot film was obtained of the resulting arthrogram.    One to 1.25 ml of the steroid/anesthetic solution was then injected into each of the facet joints noted  above.   Additional Comments:  The patient tolerated the procedure well Dressing: Band-Aid    Post-procedure details: Patient was observed during the procedure. Post-procedure instructions were reviewed.  Patient left the clinic in stable condition.

## 2016-07-30 NOTE — Progress Notes (Signed)
Jeremiah Walker FALLS - 83 y.o. male MRN [id number]  Date of birth: 1928-05-23  Office Visit Note: Visit Date: 07/30/2016 PCP: Jeremiah Blinks, MD Referred by: Jeremiah Mclean, MD  Subjective: Chief Complaint  Patient presents with  . Lower Back - Pain   HPI: [name] is an 83 year old gentleman with chronic pain syndrome and chronic axial low back pain. Patient is here today for planned bilateral L4-5 facet injection. No change in symptoms. Please see prior evaluation and management note for further details and justification.    ROS Otherwise per HPI.  Assessment & Plan: Visit Diagnoses:  1. Spondylosis without myelopathy or radiculopathy, lumbar region   2. Chronic midline low back pain without sciatica     Plan: Findings:  Bilateral L4-5 facet joint block with fluoroscopic guidance. Please see our prior evaluation and management note for further details and justification.    Meds & Orders:  Meds ordered this encounter  Medications  . lidocaine (PF) (XYLOCAINE) 1 % injection 0.3 mL  . bupivacaine (MARCAINE) 0.5 % (with pres) injection 50 mL  . methylPREDNISolone acetate (DEPO-MEDROL) injection 80 mg    Orders Placed This Encounter  Procedures  . Facet Injection  . XR C-ARM NO REPORT    Follow-up: Return in about 3 weeks (around 08/20/2016).   Procedures: No procedures performed  Lumbar Facet Joint Intra-Articular Injection(s) with Fluoroscopic Guidance  Patient: Jeremiah Walker      Date of Birth: [birth year] MRN: [id number] PCP: Jeremiah Blinks, MD      Visit Date: 07/30/2016   Universal Protocol:    Date/Time: 02/05/182:01 PM  Consent Given By: the patient  Position: PRONE   Additional Comments: Vital signs were monitored before and after the procedure. Patient was prepped and draped in the usual sterile fashion. The correct patient, procedure, and site was verified.   Injection Procedure Details:  Procedure Site One Meds Administered:  Meds  ordered this encounter  Medications  . lidocaine (PF) (XYLOCAINE) 1 % injection 0.3 mL  . bupivacaine (MARCAINE) 0.5 % (with pres) injection 50 mL  . methylPREDNISolone acetate (DEPO-MEDROL) injection 80 mg     Laterality: Bilateral  Location/Site:  L4-L5  Needle size: 22 guage  Needle type: Spinal  Needle Placement: Articular  Findings:  -Contrast Used: 1 mL iohexol 180 mg iodine/mL   -Comments: Excellent flow of contrast producing a partial arthrogram.  Procedure Details: The fluoroscope beam is vertically oriented in AP, and the inferior recess is visualized beneath the lower pole of the inferior apophyseal process, which represents the target point for needle insertion. When direct visualization is difficult the target point is located at the medial projection of the vertebral pedicle. The region overlying each aforementioned target is locally anesthetized with a 1 to 2 ml. volume of 1% Lidocaine without Epinephrine.   The spinal needle was inserted into each of the above mentioned facet joints using biplanar fluoroscopic guidance. A 0.25 to 0.5 ml. volume of Isovue-250 was injected and a partial facet joint arthrogram was obtained. A single spot film was obtained of the resulting arthrogram.    One to 1.25 ml of the steroid/anesthetic solution was then injected into each of the facet joints noted above.   Additional Comments:  The patient tolerated the procedure well Dressing: Band-Aid    Post-procedure details: Patient was observed during the procedure. Post-procedure instructions were reviewed.  Patient left the clinic in stable condition.     Clinical History: Lumbar spine MRI dated 05/29/2016   L4-5: Anterolisthesis,  uncovered disc with small bulge, central protrusion, moderate facet and ligamentum flavum hypertrophy, and large right foraminal and extra foraminal marginal osteophyte. Severe right foraminal narrowing. Mild left foraminal narrowing. Effacement  of lateral recesses with contact upon the descending right L5 nerve root. No significant canal stenosis.   L5-S1: Small disc bulge and mild facet/ligamentum flavum hypertrophy. Mild bilateral foraminal narrowing. Right-sided 5 mm T2 hyperintense structure arising from the right facet joint possibly impinging the exiting right L5 nerve root likely a synovial cyst  Mild S-shaped lumbar curvature, grade 1 L4-5 anterolisthesis, and prominent discogenic and facet arthropathy. No acute osseous abnormality.  No high-grade canal stenosis. 3. Multiple levels of foraminal narrowing moderate at left L2-3, moderate at right L3-4, and severe at right L4-5 levels. 4. Large marginal osteophytes with contact upon exiting left L2 nerve root at the L2-3 level and right L3 nerve root at the L3-4 level.   He reports that he has quit smoking. He has never used smokeless tobacco.   Recent Labs  05/02/16 1332  LABURIC 7.7    Objective:  VS:  HT:    WT:   BMI:     BP:133/64  HR:69bpm  TEMP: ( )  RESP:94 % Physical Exam  Musculoskeletal:  The patient ambulates with a cane with a forward flexed spine. He has pain with extension rotation and good distal strength.    Ortho Exam Imaging: Xr C-arm No Report  Result Date: 07/30/2016 Please see Notes or Procedures tab for imaging impression.   Past Medical/Family/Surgical/Social History: Medications & Allergies reviewed per EMR Patient Active Problem List   Diagnosis Date Noted  . Chronic pain syndrome 09/26/2015  . De Quervain's tenosynovitis, right 09/13/2015  . Anemia due to antineoplastic chemotherapy 08/15/2015  . DLBCL (diffuse large B cell lymphoma) ([place]) 08/15/2015  . Skin lesion of face 05/21/2015  . Peripheral neuropathy due to chemotherapy ([place]) 02/09/2015  . Benign prostatic hyperplasia with urinary obstruction 02/09/2015  . Bilateral hearing loss 02/09/2015  . Low back pain 02/09/2015  . Chronic kidney disease (CKD), stage III  (moderate) 02/09/2015  . Essential (primary) hypertension 02/09/2015  . Gastro-esophageal reflux disease without esophagitis 02/09/2015  . Anxiety, generalized 02/09/2015  . Cannot sleep 02/09/2015  . Combined fat and carbohydrate induced hyperlipemia 02/09/2015   Past Medical History:  Diagnosis Date  . Anemia   . Anemia due to antineoplastic chemotherapy 08/15/2015  . Anxiety   . Arthritis   . Cancer ([place])   . Cataract   . Chronic kidney disease (CKD), stage III (moderate) 02/09/2015   Controlled  . Combined fat and carbohydrate induced hyperlipemia 02/09/2015   Controlled  . Depression   . DLBCL (diffuse large B cell lymphoma) ([place]) 08/15/2015  . Essential (primary) hypertension 02/09/2015  . Gastro-esophageal reflux disease without esophagitis 02/09/2015   Controlled  . Neuromuscular disorder ([place])   . Tinnitus    Family History  Problem Relation Age of Onset  . Anuerysm Mother 21    Deceased  . Colon cancer Father 63    Deceased  . Colon cancer Brother   . Prostate cancer Brother     Deceased  . Alzheimer's disease Brother   . Heart disease Brother   . Alzheimer's disease Paternal Grandmother   . Early death Maternal Grandmother     Unknown  . Obesity Son   . Obesity Son     Gastric Bypass  . Healthy Son   . Healthy Daughter   . Diabetes Neg Hx  Past Surgical History:  Procedure Laterality Date  . CATARACT EXTRACTION, BILATERAL    . COLONOSCOPY  May 2011  . CYSTOSCOPY    . TOTAL HIP ARTHROPLASTY  2003   Right   . TOTAL HIP ARTHROPLASTY  April, 2011   Left  . TRIGGER FINGER RELEASE  Nov 2013  . TURBINECTOMY  2006  . UPPER GI ENDOSCOPY  Nov 2015   Social History   Occupational History  . Not on file.   Social History Main Topics  . Smoking status: Former Research scientist (life sciences)  . Smokeless tobacco: Never Used     Comment: Quit >50 years ago  . Alcohol use 0.6 - 2.4 oz/week    1 - 4 Shots of liquor per week  . Drug use: No  . Sexual activity: Not on file

## 2016-08-13 ENCOUNTER — Encounter: Payer: Self-pay | Admitting: Family Medicine

## 2016-08-13 DIAGNOSIS — G894 Chronic pain syndrome: Secondary | ICD-10-CM

## 2016-08-13 MED ORDER — HYDROCODONE-ACETAMINOPHEN 7.5-325 MG PO TABS: 1.0000 | ORAL_TABLET | Freq: Four times a day (QID) | ORAL | 0 refills | Status: DC | PRN

## 2016-08-13 NOTE — Telephone Encounter (Signed)
Indication for chronic opioid: back pain Medication and dose: hydrocodone 7.5, every 6 hours as needed # pills per month: 100 Last UDS date: needs Pain contract signed (Y/N): yes, 2017 Date narcotic database last reviewed (include red flags): today   Last rx from Dr. Larose Kells, dated 1/19

## 2016-08-14 ENCOUNTER — Encounter: Payer: Self-pay | Admitting: Family Medicine

## 2016-08-14 DIAGNOSIS — Z79899 Other long term (current) drug therapy: Secondary | ICD-10-CM | POA: Diagnosis not present

## 2016-08-14 DIAGNOSIS — Z79891 Long term (current) use of opiate analgesic: Secondary | ICD-10-CM | POA: Diagnosis not present

## 2016-08-21 ENCOUNTER — Encounter (INDEPENDENT_AMBULATORY_CARE_PROVIDER_SITE_OTHER): Payer: Self-pay | Admitting: Physical Medicine and Rehabilitation

## 2016-08-21 ENCOUNTER — Ambulatory Visit (INDEPENDENT_AMBULATORY_CARE_PROVIDER_SITE_OTHER): Payer: Medicare Other | Admitting: Physical Medicine and Rehabilitation

## 2016-08-21 VITALS — BP 141/75 | HR 69

## 2016-08-21 DIAGNOSIS — M47816 Spondylosis without myelopathy or radiculopathy, lumbar region: Secondary | ICD-10-CM | POA: Diagnosis not present

## 2016-08-21 DIAGNOSIS — M545 Low back pain, unspecified: Secondary | ICD-10-CM

## 2016-08-21 DIAGNOSIS — G8929 Other chronic pain: Secondary | ICD-10-CM

## 2016-08-21 NOTE — Progress Notes (Signed)
Jeremiah Walker - 83 y.o. male MRN [id number]  Date of birth: 11/21/1927  Office Visit Note: Visit Date: 08/21/2016 PCP: Jeremiah Blinks, MD Referred by: Jeremiah Mclean, MD  Subjective: Chief Complaint  Patient presents with  . Lower Back - Pain   HPI: Jeremiah Walker is an 83 year old gentleman that we completed facet joint blocks on 07/30/2016. He states that over the course of the first week he got pretty dramatic improvement. He said at first it didn't seem to help very much and then all of a sudden he was at a symphony performance and he was able to walk more upright with a longer stride length and was very happy. He has reduced his hydrocodone usage from 3-4 a day to 0. He took one yesterday but feels like he had over exerted himself because he was feeling so much better. He says on Friday he did overexercise a little bit in therapy and is having some muscle pain that he feels is different. He is very happy with the results to this point. He still has some back pain he still has some hip and leg pain but overall he is much better. Again he has reduced his hydrocodone use is dramatically. He has no radicular symptoms. He has MRI evidence with no central stenosis. He has multilevel facet arthropathy severe at L4-5 which is where we did the facet joint diagnostic blocks. He has had no focal weakness. He has had physical therapy and is having ongoing therapy. He is felt medication management but has improved his hydrocodone usage. He still tries to stay active. This been a long ongoing process of pain for him    Injection 07/30/16. Slowly improved, was able to exercise. Says he over-exercised Friday and now has some muscle pain in his left leg. Pain he was having before the injection has improved some, but he is surprised that it took about a week to work. Review of Systems  Constitutional: Negative for chills, fever, malaise/fatigue and weight loss.  HENT: Negative for hearing loss and sinus  pain.   Eyes: Negative for blurred vision, double vision and photophobia.  Respiratory: Negative for cough and shortness of breath.   Cardiovascular: Negative for chest pain, palpitations and leg swelling.  Gastrointestinal: Negative for abdominal pain, nausea and vomiting.  Genitourinary: Negative for flank pain.  Musculoskeletal: Positive for back pain. Negative for myalgias.  Skin: Negative for itching and rash.  Neurological: Negative for tremors, focal weakness and weakness.  Endo/Heme/Allergies: Negative.   Psychiatric/Behavioral: Negative for depression.  All other systems reviewed and are negative.  Otherwise per HPI.  Assessment & Plan: Visit Diagnoses:  1. Spondylosis without myelopathy or radiculopathy, lumbar region   2. Chronic midline low back pain without sciatica     Plan: Findings:  Chronic worsening severe axial low back pain history now with improvement after facet joint block diagnostically at L4-5. This was a bilateral block for bilateral low back pain. His pain is worse with standing worse with extension better at rest and sitting. He has no radicular pain. He has imaging showing facet arthropathy at L4-5 and L5-S1 with a more severe at L4-5. He has no central stenosis. He is felt medication management including opioids. He has reduced his opioid after the injection. He is undergoing physical therapy now has had physical therapy in the past. This is been a very chronic long-term problem for him. At this point were to see at what point is size that the symptoms have worsened  and we would complete a second diagnostic block which would be a facet joint block probably from medial branch standpoint. If he does well with that he would be an excellent candidate for radiofrequency ablation. I'm not sure how any insurance company at that point including Medicare would look at this and stated radiofrequency ablation was not the right answer here. We'll see him back when his pain  returns.    Meds & Orders: No orders of the defined types were placed in this encounter.  No orders of the defined types were placed in this encounter.   Follow-up: Return if symptoms worsen or fail to improve.   Procedures: No procedures performed  No notes on file   Clinical History: Lumbar spine MRI dated 05/29/2016   L4-5: Anterolisthesis, uncovered disc with small bulge, central protrusion, moderate facet and ligamentum flavum hypertrophy, and large right foraminal and extra foraminal marginal osteophyte. Severe right foraminal narrowing. Mild left foraminal narrowing. Effacement of lateral recesses with contact upon the descending right L5 nerve root. No significant canal stenosis.   L5-S1: Small disc bulge and mild facet/ligamentum flavum hypertrophy. Mild bilateral foraminal narrowing. Right-sided 5 mm T2 hyperintense structure arising from the right facet joint possibly impinging the exiting right L5 nerve root likely a synovial cyst  Mild S-shaped lumbar curvature, grade 1 L4-5 anterolisthesis, and prominent discogenic and facet arthropathy. No acute osseous abnormality.  No high-grade canal stenosis. 3. Multiple levels of foraminal narrowing moderate at left L2-3, moderate at right L3-4, and severe at right L4-5 levels. 4. Large marginal osteophytes with contact upon exiting left L2 nerve root at the L2-3 level and right L3 nerve root at the L3-4 level.   He reports that he has quit smoking. He has never used smokeless tobacco.   Recent Labs  05/02/16 1332  LABURIC 7.7    Objective:  VS:  HT:    WT:   BMI:     BP:(!) 141/75  HR:69bpm  TEMP: ( )  RESP:  Physical Exam  Constitutional: He is oriented to person, place, and time. He appears well-developed and well-nourished. No distress.  HENT:  Head: Normocephalic and atraumatic.  Eyes: Conjunctivae are normal. Pupils are equal, round, and reactive to light.  Neck: Normal range of motion. Neck supple.    Cardiovascular: Regular rhythm and intact distal pulses.   Pulmonary/Chest: Effort normal. No respiratory distress.  Musculoskeletal:  Patient stands from a seated position with some difficulty. He has concordant pain with extension rotation of the lumbar spine. No pain over the greater trochanters no pain with hip rotation. He has good distal strength of the lateral lower extremities and symmetric. He has no clonus. He does ambulate with a cane.  Neurological: He is alert and oriented to person, place, and time. He exhibits normal muscle tone. Coordination normal.  Skin: Skin is warm and dry. No rash noted. No erythema.  Psychiatric: He has a normal mood and affect.  Nursing note and vitals reviewed.   Ortho Exam Imaging: No results found.  Past Medical/Family/Surgical/Social History: Medications & Allergies reviewed per EMR Patient Active Problem List   Diagnosis Date Noted  . Chronic pain syndrome 09/26/2015  . De Quervain's tenosynovitis, right 09/13/2015  . Anemia due to antineoplastic chemotherapy 08/15/2015  . DLBCL (diffuse large B cell lymphoma) ([place]) 08/15/2015  . Skin lesion of face 05/21/2015  . Peripheral neuropathy due to chemotherapy ([place]) 02/09/2015  . Benign prostatic hyperplasia with urinary obstruction 02/09/2015  . Bilateral hearing loss 02/09/2015  .  Low back pain 02/09/2015  . Chronic kidney disease (CKD), stage III (moderate) 02/09/2015  . Essential (primary) hypertension 02/09/2015  . Gastro-esophageal reflux disease without esophagitis 02/09/2015  . Anxiety, generalized 02/09/2015  . Cannot sleep 02/09/2015  . Combined fat and carbohydrate induced hyperlipemia 02/09/2015   Past Medical History:  Diagnosis Date  . Anemia   . Anemia due to antineoplastic chemotherapy 08/15/2015  . Anxiety   . Arthritis   . Cancer ([place])   . Cataract   . Chronic kidney disease (CKD), stage III (moderate) 02/09/2015   Controlled  . Combined fat and carbohydrate induced  hyperlipemia 02/09/2015   Controlled  . Depression   . DLBCL (diffuse large B cell lymphoma) ([place]) 08/15/2015  . Essential (primary) hypertension 02/09/2015  . Gastro-esophageal reflux disease without esophagitis 02/09/2015   Controlled  . Neuromuscular disorder ([place])   . Tinnitus    Family History  Problem Relation Age of Onset  . Anuerysm Mother 61    Deceased  . Colon cancer Father 65    Deceased  . Colon cancer Brother   . Prostate cancer Brother     Deceased  . Alzheimer's disease Brother   . Heart disease Brother   . Alzheimer's disease Paternal Grandmother   . Early death Maternal Grandmother     Unknown  . Obesity Son   . Obesity Son     Gastric Bypass  . Healthy Son   . Healthy Daughter   . Diabetes Neg Hx    Past Surgical History:  Procedure Laterality Date  . CATARACT EXTRACTION, BILATERAL    . COLONOSCOPY  May 2011  . CYSTOSCOPY    . TOTAL HIP ARTHROPLASTY  2003   Right   . TOTAL HIP ARTHROPLASTY  April, 2011   Left  . TRIGGER FINGER RELEASE  Nov 2013  . TURBINECTOMY  2006  . UPPER GI ENDOSCOPY  Nov 2015   Social History   Occupational History  . Not on file.   Social History Main Topics  . Smoking status: Former Research scientist (life sciences)  . Smokeless tobacco: Never Used     Comment: Quit >50 years ago  . Alcohol use 0.6 - 2.4 oz/week    1 - 4 Shots of liquor per week  . Drug use: No  . Sexual activity: Not on file

## 2016-08-22 ENCOUNTER — Encounter (INDEPENDENT_AMBULATORY_CARE_PROVIDER_SITE_OTHER): Payer: Self-pay | Admitting: Physical Medicine and Rehabilitation

## 2016-08-22 ENCOUNTER — Encounter: Payer: Self-pay | Admitting: Family Medicine

## 2016-08-22 MED ORDER — DOXAZOSIN MESYLATE 2 MG PO TABS: 2.0000 mg | ORAL_TABLET | Freq: Two times a day (BID) | ORAL | 1 refills | Status: DC

## 2016-08-26 ENCOUNTER — Other Ambulatory Visit: Payer: Self-pay | Admitting: Physician Assistant

## 2016-08-27 ENCOUNTER — Other Ambulatory Visit: Payer: Self-pay | Admitting: Emergency Medicine

## 2016-08-27 MED ORDER — GABAPENTIN 300 MG PO CAPS: ORAL_CAPSULE | ORAL | 0 refills | Status: DC

## 2016-09-13 ENCOUNTER — Encounter: Payer: Self-pay | Admitting: Family Medicine

## 2016-09-14 ENCOUNTER — Other Ambulatory Visit: Payer: Self-pay | Admitting: Emergency Medicine

## 2016-09-14 DIAGNOSIS — G894 Chronic pain syndrome: Secondary | ICD-10-CM

## 2016-09-14 NOTE — Telephone Encounter (Signed)
Requesting: HYDROcodone-acetaminopen (NORCO) 7.5- 325 MG tablet.  90/30 days Contract: signed 08/05/15 UDS: uds sample given 08/13/16, low risk next screen 02/10/17. Last OV: 06/04/16 Last Refill: 08/13/2016   Please Advise

## 2016-09-17 MED ORDER — HYDROCODONE-ACETAMINOPHEN 7.5-325 MG PO TABS: 1.0000 | ORAL_TABLET | Freq: Four times a day (QID) | ORAL | 0 refills | Status: DC | PRN

## 2016-09-29 ENCOUNTER — Encounter: Payer: Self-pay | Admitting: Family Medicine

## 2016-10-03 ENCOUNTER — Ambulatory Visit (INDEPENDENT_AMBULATORY_CARE_PROVIDER_SITE_OTHER): Payer: Medicare Other | Admitting: Family Medicine

## 2016-10-03 ENCOUNTER — Encounter: Payer: Self-pay | Admitting: Family Medicine

## 2016-10-03 VITALS — BP 118/58 | HR 67 | Temp 98.0°F | Ht 69.0 in | Wt 222.9 lb

## 2016-10-03 DIAGNOSIS — G6289 Other specified polyneuropathies: Secondary | ICD-10-CM

## 2016-10-03 DIAGNOSIS — G4700 Insomnia, unspecified: Secondary | ICD-10-CM

## 2016-10-03 DIAGNOSIS — M545 Low back pain, unspecified: Secondary | ICD-10-CM

## 2016-10-03 DIAGNOSIS — G2581 Restless legs syndrome: Secondary | ICD-10-CM

## 2016-10-03 DIAGNOSIS — G894 Chronic pain syndrome: Secondary | ICD-10-CM

## 2016-10-03 MED ORDER — ALPRAZOLAM 0.5 MG PO TABS: 0.5000 mg | ORAL_TABLET | Freq: Three times a day (TID) | ORAL | 2 refills | Status: DC | PRN

## 2016-10-03 MED ORDER — HYDROCODONE-ACETAMINOPHEN 7.5-325 MG PO TABS: 1.0000 | ORAL_TABLET | Freq: Four times a day (QID) | ORAL | 0 refills | Status: DC | PRN

## 2016-10-03 MED ORDER — GABAPENTIN 600 MG PO TABS: 900.0000 mg | ORAL_TABLET | Freq: Three times a day (TID) | ORAL | 3 refills | Status: DC

## 2016-10-03 NOTE — Patient Instructions (Signed)
We are going to try gabapentin 600 mg TABLET- take 1.5 tablets three times a day to equal 2700 mg daily Please let me know if any difficutly getting this filled I also refilled your anxiety and pain meds today  Take care!  Please schedule a dermatology appt to look at the spot on your right cheek. Let's visit here in about 4 months

## 2016-10-03 NOTE — Progress Notes (Signed)
[place] at [facility] [address] [place], [place] [address] [phone]  Date:  10/03/2016   Name:  Jeremiah Walker   DOB:  1927/07/01   MRN:  [id number]  PCP:  Lamar Blinks, MD    Chief Complaint: Follow-up (Pt here for f/u visit. c/o chronic pain from muscle atrophy. pt would like to discuss ongoing lumbar pain L4 and L5. Using Tums for acid reflux. Pt states insurance no longer covers Gabapentin. )   History of Present Illness:  Jeremiah Walker is a 83 y.o. very pleasant male patient who presents with the following:  Here today for a follow-up visit History of peripheral neuropathy/ chronic pain, BPH, history of lymphoma, CKD, gout Here today with his daughter Manuela Schwartz who is visiting from [place] HPI from our last visit in December:   He had diffuse large B cell lymphoma- this was dx in 2015 when he developed an enlarged supraclavicular node. He sees Dr. Marin Olp.  He underwent chemo for about 4 months, now they are in observation Next appt with Dr. 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 He would like to try and titrate down some.  He plans to decrease by one 300 mg pill every 1-2 weeks.  He sees Dr. Dannielle Burn for his CKD- they think this may be due to rhabdo from a statin.  He is no longer using a statin  His renal disease is stable, he is seen every 6 months Most recent creat 1.7, GFR about 35  Indication for chronic opioid: back pain Medication and dose: hydrocodone 7.5 # pills per month: 100 Last UDS date: 07/2016 Pain contract signed (Y/N): yes Date narcotic database last reviewed (include red flags): today  NCCSR: last rx for hydrocodone 3/27, 2/20, 1/19 He also gets alprazolam filled every month on average, #90  He will see En[facility] next month  He is on gabapentin 900 TID- 3 of the 300 mg TID. He tried to decrease this but had more pain. He has had a problem with his  insurance company as he is exceeding the quantity limit.  We would like to try and work around this for him He goes to the gym to get exercise- this does seem to help him with his pain. He is walking as much as he is able but he does use a walker some of the time He has noted left hip pain that comes and goes- worse with more exercise.  We suspect arthritis  His appetite is good, energy level is pretty good  BP Readings from Last 3 Encounters:  10/03/16 (!) 118/58  08/21/16 (!) 141/75  07/30/16 133/64       Patient Active Problem List   Diagnosis Date Noted  . Chronic pain syndrome 09/26/2015  . De Quervain's tenosynovitis, right 09/13/2015  . Anemia due to antineoplastic chemotherapy 08/15/2015  . DLBCL (diffuse large B cell lymphoma) ([place]) 08/15/2015  . Skin lesion of face 05/21/2015  . Peripheral neuropathy due to chemotherapy ([place]) 02/09/2015  . Benign prostatic hyperplasia with urinary obstruction 02/09/2015  . Bilateral hearing loss 02/09/2015  . Low back pain 02/09/2015  . Chronic kidney disease (CKD), stage III (moderate) 02/09/2015  . Essential (primary) hypertension 02/09/2015  . Gastro-esophageal reflux disease without esophagitis 02/09/2015  . Anxiety, generalized 02/09/2015  . Cannot sleep 02/09/2015  . Combined fat and carbohydrate induced hyperlipemia 02/09/2015    Past Medical History:  Diagnosis Date  .  Anemia   . Anemia due to antineoplastic chemotherapy 08/15/2015  . Anxiety   . Arthritis   . Cancer ([place])   . Cataract   . Chronic kidney disease (CKD), stage III (moderate) 02/09/2015   Controlled  . Combined fat and carbohydrate induced hyperlipemia 02/09/2015   Controlled  . Depression   . DLBCL (diffuse large B cell lymphoma) ([place]) 08/15/2015  . Essential (primary) hypertension 02/09/2015  . Gastro-esophageal reflux disease without esophagitis 02/09/2015   Controlled  . Neuromuscular disorder ([place])   . Tinnitus     Past Surgical History:   Procedure Laterality Date  . CATARACT EXTRACTION, BILATERAL    . COLONOSCOPY  May 2011  . CYSTOSCOPY    . TOTAL HIP ARTHROPLASTY  2003   Right   . TOTAL HIP ARTHROPLASTY  April, 2011   Left  . TRIGGER FINGER RELEASE  Nov 2013  . TURBINECTOMY  2006  . UPPER GI ENDOSCOPY  Nov 2015    Social History  Substance Use Topics  . Smoking status: Former Research scientist (life sciences)  . Smokeless tobacco: Never Used     Comment: Quit >50 years ago  . Alcohol use 0.6 - 2.4 oz/week    1 - 4 Shots of liquor per week    Family History  Problem Relation Age of Onset  . Anuerysm Mother 51    Deceased  . Colon cancer Father 23    Deceased  . Colon cancer Brother   . Prostate cancer Brother     Deceased  . Alzheimer's disease Brother   . Heart disease Brother   . Alzheimer's disease Paternal Grandmother   . Early death Maternal Grandmother     Unknown  . Obesity Son   . Obesity Son     Gastric Bypass  . Healthy Son   . Healthy Daughter   . Diabetes Neg Hx     Allergies  Allergen Reactions  . Nsaids Other (See Comments) and Tinitus    Bruising   . Ciprofloxacin     Other reaction(s): Other (See Comments) Achilles tendon  . Statins     Other reaction(s): Kidney issues Muscle pain  . Requip [Ropinirole Hcl] Anxiety  . Ropinirole Anxiety    Mood swing    Medication list has been reviewed and updated.  Current Outpatient Prescriptions on File Prior to Visit  Medication Sig Dispense Refill  . amoxicillin (AMOXIL) 500 MG capsule Take 4 capsules (2,000 mg total) by mouth as directed. ONE HOUR PRIOR TO DENTAL PROCEDURES 4 capsule 1  . bethanechol (URECHOLINE) 25 MG tablet TAKE 1 TABLET BY MOUTH THREE TIMES DAILY 90 tablet 3  . Calcium 500 MG tablet Take 600 mg by mouth daily.    . Cholecalciferol (VITAMIN D3) 2000 UNITS capsule Take 2,000 Units by mouth daily.    . diazepam (VALIUM) 5 MG tablet Take 1 tablet (5 mg total) by mouth every 12 (twelve) hours as needed for muscle spasms. Take instead of  ALprazolam 10 tablet 1  . doxazosin (CARDURA) 2 MG tablet Take 1 tablet (2 mg total) by mouth 2 (two) times daily. 180 tablet 1  . fluorouracil (EFUDEX) 5 % cream     . gabapentin (NEURONTIN) 300 MG capsule TAKE 3 CAPSULES BY MOUTH THREE TIMES DAILY, PLUS 2 CAPSULES EVERY DAY AS NEEDED 990 capsule 0  . magnesium 30 MG tablet Take 30 mg by mouth 2 (two) times daily as needed.    . niacin 500 MG tablet Take 500 mg by mouth  at bedtime.    . Omega-3 Fatty Acids (OMEGA-3 FISH OIL PO) Take 2 each by mouth every morning. EPA/DHA 520/350    . polyethylene glycol (MIRALAX / GLYCOLAX) packet Take 17 g by mouth daily as needed.    . psyllium (METAMUCIL) 58.6 % powder Take 1 packet by mouth daily. 1 Teaspoon daily    . S-Adenosylmethionine (SAM-E) 400 MG TABS Take 400 mg by mouth daily.    Marland Kitchen zinc gluconate 50 MG tablet Take 50 mg by mouth daily.     Current Facility-Administered Medications on File Prior to Visit  Medication Dose Route Frequency Provider Last Rate Last Dose  . bupivacaine (MARCAINE) 0.5 % (with pres) injection 50 mL  50 mL Other Once Magnus Sinning, MD      . lidocaine (PF) (XYLOCAINE) 1 % injection 0.3 mL  0.3 mL Other Once Magnus Sinning, MD      . methylPREDNISolone acetate (DEPO-MEDROL) injection 80 mg  80 mg Other Once Magnus Sinning, MD        Review of Systems:  As per HPI- otherwise negative.   Physical Examination: Vitals:   10/03/16 1310  BP: (!) 118/58  Pulse: 67  Temp: 98 F (36.7 C)   Vitals:   10/03/16 1310  Weight: 222 lb 13.9 oz (101.1 kg)  Height: 5\' 9"  (1.753 m)   Body mass index is 32.91 kg/m. Ideal Body Weight: Weight in (lb) to have BMI = 25: 168.9  GEN: WDWN, NAD, Non-toxic, A & O x 3, obese, looks well HEENT: Atraumatic, Normocephalic. Neck supple. No masses, No LAD. Ears and Nose: No external deformity. CV: RRR, No M/G/R. No JVD. No thrill. No extra heart sounds. PULM: CTA B, no wheezes, crackles, rhonchi. No retractions. No resp. distress.  No accessory muscle use. ABD: S, NT, ND EXTR: No c/c/e NEURO Normal gait.  PSYCH: Normally interactive. Conversant. Not depressed or anxious appearing.  Calm demeanor.  He has noted a spot on his right cheek which he thought was an ingrown hair- it has been slow to heal   Assessment and Plan: Other polyneuropathy ([place]) - Plan: gabapentin (NEURONTIN) 600 MG tablet  Acute left-sided low back pain without sciatica  Chronic pain syndrome - Plan: HYDROcodone-acetaminophen (NORCO) 7.5-325 MG tablet, DISCONTINUED: HYDROcodone-acetaminophen (NORCO) 7.5-325 MG tablet  Restless legs - Plan: ALPRAZolam (XANAX) 0.5 MG tablet  Persistent insomnia - Plan: ALPRAZolam (XANAX) 0.5 MG tablet  Here today to follow-up on his chronic pain, neuropathy, RLS and GAD Refilled his norco today Also refilled his gabapentin- decided to have him take 600 mg, 1.5 tablets TID to try and get his covered by his insurance  Meds ordered this encounter  Medications  . gabapentin (NEURONTIN) 600 MG tablet    Sig: Take 1.5 tablets (900 mg total) by mouth 3 (three) times daily.    Dispense:  404 tablet    Refill:  3  . DISCONTD: HYDROcodone-acetaminophen (NORCO) 7.5-325 MG tablet    Sig: Take 1 tablet by mouth every 6 (six) hours as needed for moderate pain.    Dispense:  100 tablet    Refill:  0  . ALPRAZolam (XANAX) 0.5 MG tablet    Sig: Take 1 tablet (0.5 mg total) by mouth 3 (three) times daily as needed for anxiety.    Dispense:  90 tablet    Refill:  2  . HYDROcodone-acetaminophen (NORCO) 7.5-325 MG tablet    Sig: Take 1 tablet by mouth every 6 (six) hours as needed for moderate pain.  Ok to fill in 30 days    Dispense:  100 tablet    Refill:  0     Signed Lamar Blinks, MD

## 2016-10-03 NOTE — Progress Notes (Signed)
Pre visit review using our clinic review tool, if applicable. No additional management support is needed unless otherwise documented below in the visit note. 

## 2016-10-05 ENCOUNTER — Other Ambulatory Visit: Payer: Self-pay | Admitting: Family Medicine

## 2016-10-10 DIAGNOSIS — L908 Other atrophic disorders of skin: Secondary | ICD-10-CM | POA: Diagnosis not present

## 2016-10-18 ENCOUNTER — Other Ambulatory Visit: Payer: Self-pay | Admitting: *Deleted

## 2016-10-18 DIAGNOSIS — D6481 Anemia due to antineoplastic chemotherapy: Secondary | ICD-10-CM

## 2016-10-18 DIAGNOSIS — T451X5A Adverse effect of antineoplastic and immunosuppressive drugs, initial encounter: Principal | ICD-10-CM

## 2016-10-19 ENCOUNTER — Other Ambulatory Visit: Payer: Medicare Other

## 2016-10-19 DIAGNOSIS — D539 Nutritional anemia, unspecified: Secondary | ICD-10-CM | POA: Diagnosis present

## 2016-10-19 DIAGNOSIS — T451X5A Adverse effect of antineoplastic and immunosuppressive drugs, initial encounter: Principal | ICD-10-CM

## 2016-10-19 DIAGNOSIS — D6481 Anemia due to antineoplastic chemotherapy: Secondary | ICD-10-CM

## 2016-10-19 LAB — IRON AND TIBC
%SAT: 33 % (ref 20–55)
IRON: 70 ug/dL (ref 42–163)
TIBC: 210 ug/dL (ref 202–409)
UIBC: 140 ug/dL (ref 117–376)

## 2016-10-19 LAB — CBC WITH DIFFERENTIAL (CANCER CENTER ONLY)
BASO#: 0.1 10*3/uL (ref 0.0–0.2)
BASO%: 1.5 % (ref 0.0–2.0)
EOS ABS: 0 10*3/uL (ref 0.0–0.5)
EOS%: 0.6 % (ref 0.0–7.0)
HEMATOCRIT: 31.8 % — AB (ref 38.7–49.9)
HGB: 10.4 g/dL — ABNORMAL LOW (ref 13.0–17.1)
LYMPH#: 1.6 10*3/uL (ref 0.9–3.3)
LYMPH%: 23 % (ref 14.0–48.0)
MCH: 34.3 pg — AB (ref 28.0–33.4)
MCHC: 32.7 g/dL (ref 32.0–35.9)
MCV: 105 fL — AB (ref 82–98)
MONO#: 2.7 10*3/uL — AB (ref 0.1–0.9)
MONO%: 39.9 % — ABNORMAL HIGH (ref 0.0–13.0)
NEUT#: 2.4 10*3/uL (ref 1.5–6.5)
NEUT%: 35 % — AB (ref 40.0–80.0)
Platelets: 133 10*3/uL — ABNORMAL LOW (ref 145–400)
RBC: 3.03 10*6/uL — ABNORMAL LOW (ref 4.20–5.70)
RDW: 13.8 % (ref 11.1–15.7)
WBC: 6.9 10*3/uL (ref 4.0–10.0)

## 2016-10-19 LAB — FERRITIN: Ferritin: 316 ng/ml (ref 22–316)

## 2016-10-25 ENCOUNTER — Encounter (HOSPITAL_BASED_OUTPATIENT_CLINIC_OR_DEPARTMENT_OTHER): Payer: Self-pay | Admitting: *Deleted

## 2016-10-25 ENCOUNTER — Emergency Department (HOSPITAL_BASED_OUTPATIENT_CLINIC_OR_DEPARTMENT_OTHER)
Admission: EM | Admit: 2016-10-25 | Discharge: 2016-10-25 | Disposition: A | Discharge status: Home / Self Care | Payer: Medicare Other | Attending: Emergency Medicine | Admitting: Emergency Medicine

## 2016-10-25 ENCOUNTER — Emergency Department (HOSPITAL_BASED_OUTPATIENT_CLINIC_OR_DEPARTMENT_OTHER): Payer: Medicare Other

## 2016-10-25 DIAGNOSIS — M109 Gout, unspecified: Secondary | ICD-10-CM

## 2016-10-25 DIAGNOSIS — Z87891 Personal history of nicotine dependence: Secondary | ICD-10-CM | POA: Diagnosis not present

## 2016-10-25 DIAGNOSIS — M10071 Idiopathic gout, right ankle and foot: Secondary | ICD-10-CM | POA: Insufficient documentation

## 2016-10-25 DIAGNOSIS — N183 Chronic kidney disease, stage 3 (moderate): Secondary | ICD-10-CM | POA: Diagnosis not present

## 2016-10-25 DIAGNOSIS — M25571 Pain in right ankle and joints of right foot: Secondary | ICD-10-CM | POA: Diagnosis not present

## 2016-10-25 DIAGNOSIS — Z79899 Other long term (current) drug therapy: Secondary | ICD-10-CM | POA: Diagnosis not present

## 2016-10-25 DIAGNOSIS — I129 Hypertensive chronic kidney disease with stage 1 through stage 4 chronic kidney disease, or unspecified chronic kidney disease: Secondary | ICD-10-CM | POA: Insufficient documentation

## 2016-10-25 MED ORDER — PREDNISONE 10 MG (21) PO TBPK: ORAL_TABLET | ORAL | 0 refills | Status: DC

## 2016-10-25 MED ORDER — HYDROMORPHONE HCL 1 MG/ML IJ SOLN
1.0000 mg | Freq: Once | INTRAMUSCULAR | Status: AC
  Administered 2016-10-25: 1 mg via INTRAMUSCULAR
  Filled 2016-10-25: qty 1

## 2016-10-25 MED ORDER — MORPHINE SULFATE (PF) 4 MG/ML IV SOLN
4.0000 mg | Freq: Once | INTRAVENOUS | Status: AC
  Administered 2016-10-25: 4 mg via INTRAMUSCULAR
  Filled 2016-10-25: qty 1

## 2016-10-25 MED ORDER — DEXAMETHASONE SODIUM PHOSPHATE 10 MG/ML IJ SOLN
10.0000 mg | Freq: Once | INTRAMUSCULAR | Status: AC
  Administered 2016-10-25: 10 mg via INTRAMUSCULAR
  Filled 2016-10-25: qty 1

## 2016-10-25 MED FILL — predniSONE 10 MG TABS: 10 | 12 days supply | Qty: 42 | Fill #0

## 2016-10-25 NOTE — ED Provider Notes (Signed)
[facility] DEPT [facility] Provider Note   CSN: [id number] Arrival date & time: 10/25/16  6967     History   Chief Complaint Chief Complaint  Patient presents with  . Ankle Pain    right    HPI Jeremiah Walker is a 83 y.o. male.  Pt presents to the ED with right ankle pain.  The pt denies any trauma.  He said he woke up Tuesday (5/1) morning with it hurting.  The pt denies f/c.  He did take his regular 7.5 mg hydrocodone last night, but he is still having pain.  The pt denies prior hx of gout.  Pt said that it hurts to walk.      Past Medical History:  Diagnosis Date  . Anemia   . Anemia due to antineoplastic chemotherapy 08/15/2015  . Anxiety   . Arthritis   . Cancer ([place])   . Cataract   . Chronic kidney disease (CKD), stage III (moderate) 02/09/2015   Controlled  . Combined fat and carbohydrate induced hyperlipemia 02/09/2015   Controlled  . Depression   . DLBCL (diffuse large B cell lymphoma) ([place]) 08/15/2015  . Essential (primary) hypertension 02/09/2015  . Gastro-esophageal reflux disease without esophagitis 02/09/2015   Controlled  . Neuromuscular disorder ([place])   . Tinnitus     Patient Active Problem List   Diagnosis Date Noted  . Chronic pain syndrome 09/26/2015  . De Quervain's tenosynovitis, right 09/13/2015  . Anemia due to antineoplastic chemotherapy 08/15/2015  . DLBCL (diffuse large B cell lymphoma) ([place]) 08/15/2015  . Skin lesion of face 05/21/2015  . Peripheral neuropathy due to chemotherapy ([place]) 02/09/2015  . Benign prostatic hyperplasia with urinary obstruction 02/09/2015  . Bilateral hearing loss 02/09/2015  . Low back pain 02/09/2015  . Chronic kidney disease (CKD), stage III (moderate) 02/09/2015  . Essential (primary) hypertension 02/09/2015  . Gastro-esophageal reflux disease without esophagitis 02/09/2015  . Anxiety, generalized 02/09/2015  . Cannot sleep 02/09/2015  . Combined fat and carbohydrate induced hyperlipemia 02/09/2015     Past Surgical History:  Procedure Laterality Date  . CATARACT EXTRACTION, BILATERAL    . COLONOSCOPY  May 2011  . CYSTOSCOPY    . TOTAL HIP ARTHROPLASTY  2003   Right   . TOTAL HIP ARTHROPLASTY  April, 2011   Left  . TRIGGER FINGER RELEASE  Nov 2013  . TURBINECTOMY  2006  . UPPER GI ENDOSCOPY  Nov 2015       Home Medications    Prior to Admission medications   Medication Sig Start Date End Date Taking? Authorizing Provider  ALPRAZolam Duanne Moron) 0.5 MG tablet Take 1 tablet (0.5 mg total) by mouth 3 (three) times daily as needed for anxiety. 10/03/16  Yes Gay Filler Copland, MD  amoxicillin (AMOXIL) 500 MG capsule Take 4 capsules (2,000 mg total) by mouth as directed. ONE HOUR PRIOR TO DENTAL PROCEDURES 11/23/15  Yes Brunetta Jeans, PA-C  bethanechol (URECHOLINE) 25 MG tablet TAKE 1 TABLET BY MOUTH THREE TIMES DAILY 10/05/16  Yes Gay Filler Copland, MD  Calcium 500 MG tablet Take 600 mg by mouth daily.   Yes Historical Provider, MD  Cholecalciferol (VITAMIN D3) 2000 UNITS capsule Take 2,000 Units by mouth daily.   Yes Historical Provider, MD  diazepam (VALIUM) 5 MG tablet Take 1 tablet (5 mg total) by mouth every 12 (twelve) hours as needed for muscle spasms. Take instead of ALprazolam 04/23/16  Yes Brunetta Jeans, PA-C  doxazosin (CARDURA) 2 MG tablet Take  1 tablet (2 mg total) by mouth 2 (two) times daily. 08/22/16  Yes Jessica C Copland, MD  gabapentin (NEURONTIN) 300 MG capsule TAKE 3 CAPSULES BY MOUTH THREE TIMES DAILY, PLUS 2 CAPSULES EVERY DAY AS NEEDED 08/27/16  Yes Gay Filler Copland, MD  gabapentin (NEURONTIN) 600 MG tablet Take 1.5 tablets (900 mg total) by mouth 3 (three) times daily. 10/03/16  Yes Gay Filler Copland, MD  HYDROcodone-acetaminophen (NORCO) 7.5-325 MG tablet Take 1 tablet by mouth every 6 (six) hours as needed for moderate pain. Ok to fill in 30 days 10/03/16  Yes Gay Filler Copland, MD  magnesium 30 MG tablet Take 30 mg by mouth 2 (two) times daily as needed.   Yes  Historical Provider, MD  niacin 500 MG tablet Take 500 mg by mouth at bedtime.   Yes Historical Provider, MD  Omega-3 Fatty Acids (OMEGA-3 FISH OIL PO) Take 2 each by mouth every morning. EPA/DHA 520/350   Yes Historical Provider, MD  polyethylene glycol (MIRALAX / GLYCOLAX) packet Take 17 g by mouth daily as needed.   Yes Historical Provider, MD  psyllium (METAMUCIL) 58.6 % powder Take 1 packet by mouth daily. 1 Teaspoon daily   Yes Historical Provider, MD  S-Adenosylmethionine (SAM-E) 400 MG TABS Take 400 mg by mouth daily.   Yes Historical Provider, MD  zinc gluconate 50 MG tablet Take 50 mg by mouth daily.   Yes Historical Provider, MD  fluorouracil (EFUDEX) 5 % cream  02/21/16   Historical Provider, MD  predniSONE (STERAPRED UNI-PAK 21 TAB) 10 MG (21) TBPK tablet Take 6 tabs by mouth daily  for 2 days, then 5 tabs for 2 days, then 4 tabs for 2 days, then 3 tabs for 2 days, 2 tabs for 2 days, then 1 tab by mouth daily for 2 days 10/25/16   Isla Pence, MD    Family History Family History  Problem Relation Age of Onset  . Anuerysm Mother 65    Deceased  . Colon cancer Father 59    Deceased  . Colon cancer Brother   . Prostate cancer Brother     Deceased  . Alzheimer's disease Brother   . Heart disease Brother   . Alzheimer's disease Paternal Grandmother   . Early death Maternal Grandmother     Unknown  . Obesity Son   . Obesity Son     Gastric Bypass  . Healthy Son   . Healthy Daughter   . Diabetes Neg Hx     Social History Social History  Substance Use Topics  . Smoking status: Former Research scientist (life sciences)  . Smokeless tobacco: Never Used     Comment: Quit >50 years ago  . Alcohol use 0.6 - 2.4 oz/week    1 - 4 Shots of liquor per week     Allergies   Nsaids; Ciprofloxacin; Statins; Requip [ropinirole hcl]; and Ropinirole   Review of Systems Review of Systems  Musculoskeletal:       Right ankle  All other systems reviewed and are negative.    Physical Exam Updated Vital  Signs BP (!) 121/59 (BP Location: Right Arm)   Pulse 65   Temp 98.4 F (36.9 C)   Resp 20   Ht 5\' 9"  (1.753 m)   Wt 217 lb (98.4 kg)   SpO2 94%   BMI 32.05 kg/m   Physical Exam  Constitutional: He is oriented to person, place, and time. He appears well-developed and well-nourished.  HENT:  Head: Normocephalic and atraumatic.  Right Ear: External ear normal.  Left Ear: External ear normal.  Nose: Nose normal.  Mouth/Throat: Oropharynx is clear and moist.  Eyes: Conjunctivae and EOM are normal. Pupils are equal, round, and reactive to light.  Neck: Normal range of motion. Neck supple.  Cardiovascular: Normal rate, regular rhythm, normal heart sounds and intact distal pulses.   Pulmonary/Chest: Effort normal and breath sounds normal.  Abdominal: Soft. Bowel sounds are normal.  Musculoskeletal:       Feet:  Neurological: He is alert and oriented to person, place, and time.  Skin: Skin is warm.  Psychiatric: He has a normal mood and affect. His behavior is normal. Judgment and thought content normal.  Nursing note and vitals reviewed.    ED Treatments / Results  Labs (all labs ordered are listed, but only abnormal results are displayed) Labs Reviewed - No data to display  EKG  EKG Interpretation None       Radiology Dg Ankle Complete Right  Result Date: 10/25/2016 CLINICAL DATA:  Right ankle pain for 2 days with no known injury EXAM: RIGHT ANKLE - COMPLETE 3+ VIEW COMPARISON:  None. FINDINGS: No acute fracture, malalignment, or erosion. Heterotopic ossification along the distal syndesmosis. Dorsal heel and malleolar enthesophytes. No degenerative narrowing or spurring. Mild arterial calcification. IMPRESSION: No acute osseous finding. Electronically Signed   By: Monte Fantasia M.D.   On: 10/25/2016 10:44    Procedures Procedures (including critical care time)  Medications Ordered in ED Medications  dexamethasone (DECADRON) injection 10 mg (10 mg Intramuscular  Given 10/25/16 1050)  morphine 4 MG/ML injection 4 mg (4 mg Intramuscular Given 10/25/16 1050)  HYDROmorphone (DILAUDID) injection 1 mg (1 mg Intramuscular Given 10/25/16 1205)     Initial Impression / Assessment and Plan / ED Course  I have reviewed the triage vital signs and the nursing notes.  Pertinent labs & imaging results that were available during my care of the patient were reviewed by me and considered in my medical decision making (see chart for details).    I do not think pt has a septic joint.  I suspect gout, although this is his first flare.  He has risk factors for gout.  He has CRI, so I did not want to put him on colchicine.  I gave him a dose of decadron here and he will be d/c on prednisone.    Morphine did not help pain, so pt given IM dilaudid.  He was told to f/u with ortho if sx are not improving.  He also does not want to break his pain medication contract with his doctor, so he is not given a rx for additional narcotics here.   He is told to call his doctor to talk to her about additional pain medications if needed.  Pt understands.  Final Clinical Impressions(s) / ED Diagnoses   Final diagnoses:  Acute gout of right ankle, unspecified cause    New Prescriptions New Prescriptions   PREDNISONE (STERAPRED UNI-PAK 21 TAB) 10 MG (21) TBPK TABLET    Take 6 tabs by mouth daily  for 2 days, then 5 tabs for 2 days, then 4 tabs for 2 days, then 3 tabs for 2 days, 2 tabs for 2 days, then 1 tab by mouth daily for 2 days     Isla Pence, MD 10/25/16 1218

## 2016-10-25 NOTE — ED Triage Notes (Signed)
Patient states he woke up two days ago with pain in the lateral right ankle.  States the pain has progressively gotten worse and now travels across the dorsal foot to the medial ankle.  Medial ankle is red and warm to touch.

## 2016-10-30 DIAGNOSIS — Z961 Presence of intraocular lens: Secondary | ICD-10-CM | POA: Diagnosis not present

## 2016-10-30 DIAGNOSIS — H353131 Nonexudative age-related macular degeneration, bilateral, early dry stage: Secondary | ICD-10-CM | POA: Diagnosis not present

## 2016-10-30 DIAGNOSIS — H40013 Open angle with borderline findings, low risk, bilateral: Secondary | ICD-10-CM | POA: Diagnosis not present

## 2016-10-30 DIAGNOSIS — H02833 Dermatochalasis of right eye, unspecified eyelid: Secondary | ICD-10-CM | POA: Diagnosis not present

## 2016-10-31 ENCOUNTER — Encounter: Payer: Self-pay | Admitting: Hematology & Oncology

## 2016-10-31 ENCOUNTER — Other Ambulatory Visit: Payer: Self-pay | Admitting: Family Medicine

## 2016-11-05 ENCOUNTER — Ambulatory Visit (INDEPENDENT_AMBULATORY_CARE_PROVIDER_SITE_OTHER): Payer: Medicare Other | Admitting: Family Medicine

## 2016-11-05 ENCOUNTER — Ambulatory Visit (HOSPITAL_BASED_OUTPATIENT_CLINIC_OR_DEPARTMENT_OTHER)
Admission: RE | Admit: 2016-11-05 | Discharge: 2016-11-05 | Disposition: A | Discharge status: Home / Self Care | Payer: Medicare Other | Source: Ambulatory Visit | Attending: Family Medicine | Admitting: Family Medicine

## 2016-11-05 ENCOUNTER — Encounter: Payer: Self-pay | Admitting: Family Medicine

## 2016-11-05 VITALS — BP 114/60 | HR 98 | Temp 98.3°F | Ht 69.0 in | Wt 219.6 lb

## 2016-11-05 DIAGNOSIS — R531 Weakness: Secondary | ICD-10-CM | POA: Diagnosis not present

## 2016-11-05 DIAGNOSIS — Z87891 Personal history of nicotine dependence: Secondary | ICD-10-CM | POA: Diagnosis not present

## 2016-11-05 DIAGNOSIS — R5383 Other fatigue: Secondary | ICD-10-CM

## 2016-11-05 LAB — CBC
HCT: 35.9 % — ABNORMAL LOW (ref 39.0–52.0)
Hemoglobin: 11.8 g/dL — ABNORMAL LOW (ref 13.0–17.0)
MCHC: 33 g/dL (ref 30.0–36.0)
MCV: 103.7 fl — AB (ref 78.0–100.0)
PLATELETS: 149 10*3/uL — AB (ref 150.0–400.0)
RBC: 3.46 Mil/uL — AB (ref 4.22–5.81)
RDW: 15.6 % — ABNORMAL HIGH (ref 11.5–15.5)
WBC: 12.5 10*3/uL — ABNORMAL HIGH (ref 4.0–10.5)

## 2016-11-05 LAB — COMPREHENSIVE METABOLIC PANEL
ALBUMIN: 4 g/dL (ref 3.5–5.2)
ALK PHOS: 61 U/L (ref 39–117)
ALT: 35 U/L (ref 0–53)
AST: 19 U/L (ref 0–37)
BUN: 33 mg/dL — ABNORMAL HIGH (ref 6–23)
CALCIUM: 9.7 mg/dL (ref 8.4–10.5)
CO2: 31 mEq/L (ref 19–32)
CREATININE: 1.54 mg/dL — AB (ref 0.40–1.50)
Chloride: 99 mEq/L (ref 96–112)
GFR: 45.65 mL/min — ABNORMAL LOW (ref >60.00)
Glucose, Bld: 99 mg/dL (ref 70–99)
Potassium: 3.9 mEq/L (ref 3.5–5.1)
Sodium: 137 mEq/L (ref 135–145)
TOTAL PROTEIN: 7.1 g/dL (ref 6.0–8.3)
Total Bilirubin: 0.8 mg/dL (ref 0.2–1.2)

## 2016-11-05 LAB — TSH: TSH: 0.71 u[IU]/mL (ref 0.35–4.50)

## 2016-11-05 NOTE — Progress Notes (Signed)
[facility] [address], [place], [place] [address] [phone]  Date:  11/05/2016   Name:  Jeremiah Walker   DOB:  [birth year]   MRN:  [id number]  PCP:  [name], MD    Chief Complaint: Fatigue (c/o fatigue ); Arthritis (c/o arthritis left of big toe on left foot. ); and De Quervain's tenosynovitis (Hx of De Quervain's tenosynovitis in right hand and now it is in the left hand. )   History of Present Illness:  Jeremiah Walker is a 83 y.o. very pleasant male patient who presents with the following:  Here today for an ER follow-up visit- he was seen on 5/3 with ankle pain thought due to gout.  He was given dilaudid IM.  The ankle seems to be doing better.  However he notes that he has felt very tired for the last 2-3 weeks He cannot think of anything different in his medications, schedule, or any other factor that could be causing his fatigue.  He uses an OTC sleep aid- he has used the same thing for some time.   He is not feeling SOB, no chest discomfort whatsoever  He is not aware of any change in his breathing at night. He does use a nose spray at night to prevent nasal congestion No history of sleep apnea.   He does not feel like he is bothered by the heat.  He enjoyed mother's day yesterday with his famiy  He did smoke in the past but he quit in his 66s.  He will smoke a cigar a few times a year   He is a pt of Dr. Marin Olp who sees him for his history of non- hodgkin's lymphoma, completed treatment in 2014.  He has been noted to have mild anemia likely due to his stage 3 CRI.  His nephrologist is Dr. Evette Doffing at [facility]  Patient Active Problem List   Diagnosis Date Noted  . Chronic pain syndrome 09/26/2015  . De Quervain's tenosynovitis, right 09/13/2015  . Anemia due to antineoplastic chemotherapy 08/15/2015  . DLBCL (diffuse large B cell lymphoma) ([place]) 08/15/2015  . Skin lesion of face  05/21/2015  . Peripheral neuropathy due to chemotherapy ([place]) 02/09/2015  . Benign prostatic hyperplasia with urinary obstruction 02/09/2015  . Bilateral hearing loss 02/09/2015  . Low back pain 02/09/2015  . Chronic kidney disease (CKD), stage III (moderate) 02/09/2015  . Essential (primary) hypertension 02/09/2015  . Gastro-esophageal reflux disease without esophagitis 02/09/2015  . Anxiety, generalized 02/09/2015  . Cannot sleep 02/09/2015  . Combined fat and carbohydrate induced hyperlipemia 02/09/2015    Past Medical History:  Diagnosis Date  . Anemia   . Anemia due to antineoplastic chemotherapy 08/15/2015  . Anxiety   . Arthritis   . Cancer ([place])   . Cataract   . Chronic kidney disease (CKD), stage III (moderate) 02/09/2015   Controlled  . Combined fat and carbohydrate induced hyperlipemia 02/09/2015   Controlled  . Depression   . DLBCL (diffuse large B cell lymphoma) ([place]) 08/15/2015  . Essential (primary) hypertension 02/09/2015  . Gastro-esophageal reflux disease without esophagitis 02/09/2015   Controlled  . Neuromuscular disorder ([place])   . Tinnitus     Past Surgical History:  Procedure Laterality Date  . CATARACT EXTRACTION, BILATERAL    . COLONOSCOPY  May 2011  . CYSTOSCOPY    . TOTAL HIP ARTHROPLASTY  2003   Right   .  TOTAL HIP ARTHROPLASTY  April, 2011   Left  . TRIGGER FINGER RELEASE  Nov 2013  . TURBINECTOMY  2006  . UPPER GI ENDOSCOPY  Nov 2015    Social History  Substance Use Topics  . Smoking status: Former Research scientist (life sciences)  . Smokeless tobacco: Never Used     Comment: Quit >50 years ago  . Alcohol use 0.6 - 2.4 oz/week    1 - 4 Shots of liquor per week    Family History  Problem Relation Age of Onset  . Anuerysm Mother 49       Deceased  . Colon cancer Father 89       Deceased  . Colon cancer Brother   . Prostate cancer Brother        Deceased  . Alzheimer's disease Brother   . Heart disease Brother   . Alzheimer's disease Paternal Grandmother    . Early death Maternal Grandmother        Unknown  . Obesity Son   . Obesity Son        Gastric Bypass  . Healthy Son   . Healthy Daughter   . Diabetes Neg Hx     Allergies  Allergen Reactions  . Nsaids Other (See Comments) and Tinitus    Bruising   . Ciprofloxacin     Other reaction(s): Other (See Comments) Achilles tendon  . Statins     Other reaction(s): Kidney issues Muscle pain  . Requip [Ropinirole Hcl] Anxiety  . Ropinirole Anxiety    Mood swing    Medication list has been reviewed and updated.  Current Outpatient Prescriptions on File Prior to Visit  Medication Sig Dispense Refill  . ALPRAZolam (XANAX) 0.5 MG tablet Take 1 tablet (0.5 mg total) by mouth 3 (three) times daily as needed for anxiety. 90 tablet 2  . amoxicillin (AMOXIL) 500 MG capsule Take 4 capsules (2,000 mg total) by mouth as directed. ONE HOUR PRIOR TO DENTAL PROCEDURES 4 capsule 1  . bethanechol (URECHOLINE) 25 MG tablet TAKE 1 TABLET BY MOUTH THREE TIMES DAILY 90 tablet 0  . Calcium 500 MG tablet Take 600 mg by mouth daily.    . Cholecalciferol (VITAMIN D3) 2000 UNITS capsule Take 2,000 Units by mouth daily.    . diazepam (VALIUM) 5 MG tablet Take 1 tablet (5 mg total) by mouth every 12 (twelve) hours as needed for muscle spasms. Take instead of ALprazolam 10 tablet 1  . doxazosin (CARDURA) 2 MG tablet Take 1 tablet (2 mg total) by mouth 2 (two) times daily. 180 tablet 1  . fluorouracil (EFUDEX) 5 % cream     . gabapentin (NEURONTIN) 300 MG capsule TAKE 3 CAPSULES BY MOUTH THREE TIMES DAILY, PLUS 2 CAPSULES EVERY DAY AS NEEDED 990 capsule 0  . gabapentin (NEURONTIN) 600 MG tablet Take 1.5 tablets (900 mg total) by mouth 3 (three) times daily. 404 tablet 3  . HYDROcodone-acetaminophen (NORCO) 7.5-325 MG tablet Take 1 tablet by mouth every 6 (six) hours as needed for moderate pain. Ok to fill in 30 days 100 tablet 0  . magnesium 30 MG tablet Take 30 mg by mouth 2 (two) times daily as needed.    .  niacin 500 MG tablet Take 500 mg by mouth at bedtime.    . Omega-3 Fatty Acids (OMEGA-3 FISH OIL PO) Take 2 each by mouth every morning. EPA/DHA 520/350    . polyethylene glycol (MIRALAX / GLYCOLAX) packet Take 17 g by mouth daily as needed.    Marland Kitchen  predniSONE (STERAPRED UNI-PAK 21 TAB) 10 MG (21) TBPK tablet Take 6 tabs by mouth daily  for 2 days, then 5 tabs for 2 days, then 4 tabs for 2 days, then 3 tabs for 2 days, 2 tabs for 2 days, then 1 tab by mouth daily for 2 days 42 tablet 0  . psyllium (METAMUCIL) 58.6 % powder Take 1 packet by mouth daily. 1 Teaspoon daily    . S-Adenosylmethionine (SAM-E) 400 MG TABS Take 400 mg by mouth daily.    Marland Kitchen zinc gluconate 50 MG tablet Take 50 mg by mouth daily.     Current Facility-Administered Medications on File Prior to Visit  Medication Dose Route Frequency Provider Last Rate Last Dose  . bupivacaine (MARCAINE) 0.5 % (with pres) injection 50 mL  50 mL Other Once Magnus Sinning, MD      . lidocaine (PF) (XYLOCAINE) 1 % injection 0.3 mL  0.3 mL Other Once Magnus Sinning, MD      . methylPREDNISolone acetate (DEPO-MEDROL) injection 80 mg  80 mg Other Once Magnus Sinning, MD        Review of Systems:  As per HPI- otherwise negative.   Physical Examination: Vitals:   11/05/16 1240  BP: 114/60  Pulse: 98  Temp: 98.3 F (36.8 C)   Vitals:   11/05/16 1240  Weight: 219 lb 9.6 oz (99.6 kg)  Height: 5\' 9"  (1.753 m)   Body mass index is 32.43 kg/m. Ideal Body Weight: Weight in (lb) to have BMI = 25: 168.9  GEN: WDWN, NAD, Non-toxic, A & O x 3, central obesity, looks well  HEENT: Atraumatic, Normocephalic. Neck supple. No masses, No LAD.  Bilateral TM wnl, oropharynx normal.  PEERL,EOMI.   Ears and Nose: No external deformity. CV: RRR, No M/G/R. No JVD. No thrill. No extra heart sounds. PULM: CTA B, no wheezes, crackles, rhonchi. No retractions. No resp. distress. No accessory muscle use. ABD: S, NT, ND. No rebound. No HSM. EXTR: No  c/c/e NEURO Normal gait.  PSYCH: Normally interactive. Conversant. Not depressed or anxious appearing.  Calm demeanor.   EKG: sinus rhythm with frequent ectopic beats No old EKG that I can see for comparison. EKG is abnl but does not appear to be anything that should cause fatigue Dg Chest 2 View  Result Date: 11/05/2016 CLINICAL DATA:  Pt c/o recent onset of fatigue (roughly 3 weeks), ex smoker, GERD, DLBCL EXAM: CHEST  2 VIEW COMPARISON:  None. FINDINGS: The cardiac silhouette is normal in size and configuration. No mediastinal or hilar masses. No evidence of adenopathy. Lungs are clear.  No pleural effusion.  No pneumothorax. Skeletal structures are demineralized but grossly intact. IMPRESSION: No active cardiopulmonary disease. Electronically Signed   By: Lajean Manes M.D.   On: 11/05/2016 13:35   Dg Ankle Complete Right  Result Date: 10/25/2016 CLINICAL DATA:  Right ankle pain for 2 days with no known injury EXAM: RIGHT ANKLE - COMPLETE 3+ VIEW COMPARISON:  None. FINDINGS: No acute fracture, malalignment, or erosion. Heterotopic ossification along the distal syndesmosis. Dorsal heel and malleolar enthesophytes. No degenerative narrowing or spurring. Mild arterial calcification. IMPRESSION: No acute osseous finding. Electronically Signed   By: Monte Fantasia M.D.   On: 10/25/2016 10:44   Results for orders placed or performed in visit on 11/05/16  CBC  Result Value Ref Range   WBC 12.5 (H) 4.0 - 10.5 K/uL   RBC 3.46 (L) 4.22 - 5.81 Mil/uL   Platelets 149.0 (L) 150.0 - 400.0  K/uL   Hemoglobin 11.8 (L) 13.0 - 17.0 g/dL   HCT 35.9 (L) 39.0 - 52.0 %   MCV 103.7 (H) 78.0 - 100.0 fl   MCHC 33.0 30.0 - 36.0 g/dL   RDW 15.6 (H) 11.5 - 15.5 %  Comprehensive metabolic panel  Result Value Ref Range   Sodium 137 135 - 145 mEq/L   Potassium 3.9 3.5 - 5.1 mEq/L   Chloride 99 96 - 112 mEq/L   CO2 31 19 - 32 mEq/L   Glucose, Bld 99 70 - 99 mg/dL   BUN 33 (H) 6 - 23 mg/dL   Creatinine, Ser 1.54  (H) 0.40 - 1.50 mg/dL   Total Bilirubin 0.8 0.2 - 1.2 mg/dL   Alkaline Phosphatase 61 39 - 117 U/L   AST 19 0 - 37 U/L   ALT 35 0 - 53 U/L   Total Protein 7.1 6.0 - 8.3 g/dL   Albumin 4.0 3.5 - 5.2 g/dL   Calcium 9.7 8.4 - 10.5 mg/dL   GFR 45.65 (L) >60.00 mL/min  TSH  Result Value Ref Range   TSH 0.71 0.35 - 4.50 uIU/mL     Assessment and Plan: Fatigue, unspecified type - Plan: EKG 12-Lead, DG Chest 2 View, CBC, Comprehensive metabolic panel, TSH  Older man with history of NHL- here today with complaint of fatigue.  So far on his evaluation cannot determine any acute change in his condition that would cause fatigue. His white blood cell count is up some- will touch base with Dr. Marin Olp and with patient.   Will plan next step as indicated by evolution of his symptoms.   Signed Lamar Blinks, MD

## 2016-11-05 NOTE — Patient Instructions (Signed)
Please have your blood drawn and then go to get a chest x-ray on the ground floor.  I will be in touch with your results asap

## 2016-11-12 ENCOUNTER — Other Ambulatory Visit (HOSPITAL_BASED_OUTPATIENT_CLINIC_OR_DEPARTMENT_OTHER): Payer: Medicare Other

## 2016-11-12 ENCOUNTER — Ambulatory Visit (HOSPITAL_BASED_OUTPATIENT_CLINIC_OR_DEPARTMENT_OTHER): Payer: Medicare Other | Admitting: Hematology & Oncology

## 2016-11-12 VITALS — BP 110/45 | HR 81 | Temp 98.2°F | Resp 16 | Wt 222.1 lb

## 2016-11-12 DIAGNOSIS — D6481 Anemia due to antineoplastic chemotherapy: Secondary | ICD-10-CM

## 2016-11-12 DIAGNOSIS — D72821 Monocytosis (symptomatic): Secondary | ICD-10-CM

## 2016-11-12 DIAGNOSIS — N183 Chronic kidney disease, stage 3 unspecified: Secondary | ICD-10-CM

## 2016-11-12 DIAGNOSIS — T451X5A Adverse effect of antineoplastic and immunosuppressive drugs, initial encounter: Principal | ICD-10-CM

## 2016-11-12 DIAGNOSIS — Z8572 Personal history of non-Hodgkin lymphomas: Secondary | ICD-10-CM

## 2016-11-12 DIAGNOSIS — D539 Nutritional anemia, unspecified: Secondary | ICD-10-CM | POA: Diagnosis not present

## 2016-11-12 LAB — CBC WITH DIFFERENTIAL (CANCER CENTER ONLY)
BASO#: 0.1 10*3/uL (ref 0.0–0.2)
BASO%: 1.3 % (ref 0.0–2.0)
EOS ABS: 0 10*3/uL (ref 0.0–0.5)
EOS%: 0.4 % (ref 0.0–7.0)
HCT: 32.5 % — ABNORMAL LOW (ref 38.7–49.9)
HGB: 10.6 g/dL — ABNORMAL LOW (ref 13.0–17.1)
LYMPH#: 1.7 10*3/uL (ref 0.9–3.3)
LYMPH%: 22.4 % (ref 14.0–48.0)
MCH: 35 pg — AB (ref 28.0–33.4)
MCHC: 32.6 g/dL (ref 32.0–35.9)
MCV: 107 fL — AB (ref 82–98)
MONO#: 3.9 10*3/uL — AB (ref 0.1–0.9)
MONO%: 50.1 % — ABNORMAL HIGH (ref 0.0–13.0)
NEUT#: 2 10*3/uL (ref 1.5–6.5)
NEUT%: 25.8 % — AB (ref 40.0–80.0)
PLATELETS: 87 10*3/uL — AB (ref 145–400)
RBC: 3.03 10*6/uL — ABNORMAL LOW (ref 4.20–5.70)
RDW: 14.9 % (ref 11.1–15.7)
WBC: 7.8 10*3/uL (ref 4.0–10.0)

## 2016-11-12 LAB — CMP (CANCER CENTER ONLY)
ALBUMIN: 3.2 g/dL — AB (ref 3.3–5.5)
ALT(SGPT): 24 U/L (ref 10–47)
AST: 20 U/L (ref 11–38)
Alkaline Phosphatase: 62 U/L (ref 26–84)
BUN, Bld: 23 mg/dL — ABNORMAL HIGH (ref 7–22)
CALCIUM: 8.6 mg/dL (ref 8.0–10.3)
CHLORIDE: 102 meq/L (ref 98–108)
CO2: 28 meq/L (ref 18–33)
Creat: 1.4 mg/dl — ABNORMAL HIGH (ref 0.6–1.2)
Glucose, Bld: 107 mg/dL (ref 73–118)
POTASSIUM: 4.8 meq/L — AB (ref 3.3–4.7)
SODIUM: 141 meq/L (ref 128–145)
TOTAL PROTEIN: 6.5 g/dL (ref 6.4–8.1)
Total Bilirubin: 0.8 mg/dl (ref 0.20–1.60)

## 2016-11-12 LAB — TECHNOLOGIST REVIEW CHCC SATELLITE

## 2016-11-12 LAB — CHCC SATELLITE - SMEAR

## 2016-11-12 LAB — LACTATE DEHYDROGENASE: LDH: 201 U/L (ref 125–245)

## 2016-11-12 NOTE — Progress Notes (Signed)
Hematology and Oncology Follow Up Visit  Jeremiah Walker [id number] [birth year] 83 y.o. 11/12/2016   Principle Diagnosis:   History of diffuse large cell non-Hodgkin's lymphoma-treated in [place]  Macrocytic anemia/monocytosis  Current Therapy:    Observation     Interim History:  Jeremiah Walker is back for follow-up. He is not feeling too well. He just feels very fatigued. He says he has not been to the gym but once all month. This is totally unlike him.  He has had no problems with bleeding. He has had no fever. He is had some skin rashes. These appear to be scattered macular type lesions.  His appetite is doing okay.  He's had no nausea or vomiting. He's not noted any change in bowel or bladder habits. He has had some sinus congestion from the pollen.   As always, there is a "time crunch" with Jeremiah Walker. He goes up to [place] for the summer in early July.   Overall, his performance status is ECOG 1.  Medications:  Current Outpatient Prescriptions:  .  ALPRAZolam (XANAX) 0.5 MG tablet, Take 1 tablet (0.5 mg total) by mouth 3 (three) times daily as needed for anxiety., Disp: 90 tablet, Rfl: 2 .  amoxicillin (AMOXIL) 500 MG capsule, Take 4 capsules (2,000 mg total) by mouth as directed. ONE HOUR PRIOR TO DENTAL PROCEDURES, Disp: 4 capsule, Rfl: 1 .  bethanechol (URECHOLINE) 25 MG tablet, TAKE 1 TABLET BY MOUTH THREE TIMES DAILY, Disp: 90 tablet, Rfl: 0 .  Calcium 500 MG tablet, Take 600 mg by mouth daily., Disp: , Rfl:  .  Cholecalciferol (VITAMIN D3) 2000 UNITS capsule, Take 2,000 Units by mouth daily., Disp: , Rfl:  .  diazepam (VALIUM) 5 MG tablet, Take 1 tablet (5 mg total) by mouth every 12 (twelve) hours as needed for muscle spasms. Take instead of ALprazolam, Disp: 10 tablet, Rfl: 1 .  doxazosin (CARDURA) 2 MG tablet, Take 1 tablet (2 mg total) by mouth 2 (two) times daily., Disp: 180 tablet, Rfl: 1 .  fluorouracil (EFUDEX) 5 % cream, , Disp: , Rfl:  .  gabapentin  (NEURONTIN) 300 MG capsule, TAKE 3 CAPSULES BY MOUTH THREE TIMES DAILY, PLUS 2 CAPSULES EVERY DAY AS NEEDED, Disp: 990 capsule, Rfl: 0 .  gabapentin (NEURONTIN) 600 MG tablet, Take 1.5 tablets (900 mg total) by mouth 3 (three) times daily., Disp: 404 tablet, Rfl: 3 .  HYDROcodone-acetaminophen (NORCO) 7.5-325 MG tablet, Take 1 tablet by mouth every 6 (six) hours as needed for moderate pain. Ok to fill in 30 days, Disp: 100 tablet, Rfl: 0 .  magnesium 30 MG tablet, Take 30 mg by mouth 2 (two) times daily as needed., Disp: , Rfl:  .  Multiple Vitamin (MULTIVITAMIN) tablet, Take 1 tablet by mouth daily., Disp: , Rfl:  .  niacin 500 MG tablet, Take 500 mg by mouth at bedtime., Disp: , Rfl:  .  Omega-3 Fatty Acids (OMEGA-3 FISH OIL PO), Take 2 each by mouth every morning. EPA/DHA 520/350, Disp: , Rfl:  .  polyethylene glycol (MIRALAX / GLYCOLAX) packet, Take 17 g by mouth daily as needed., Disp: , Rfl:  .  predniSONE (STERAPRED UNI-PAK 21 TAB) 10 MG (21) TBPK tablet, Take 6 tabs by mouth daily  for 2 days, then 5 tabs for 2 days, then 4 tabs for 2 days, then 3 tabs for 2 days, 2 tabs for 2 days, then 1 tab by mouth daily for 2 days, Disp: 42 tablet, Rfl: 0 .  psyllium (METAMUCIL) 58.6 % powder, Take 1 packet by mouth daily. 1 Teaspoon daily, Disp: , Rfl:  .  S-Adenosylmethionine (SAM-E) 400 MG TABS, Take 400 mg by mouth daily., Disp: , Rfl:  .  vitamin C (ASCORBIC ACID) 500 MG tablet, Take 500 mg by mouth daily., Disp: , Rfl:  .  zinc gluconate 50 MG tablet, Take 50 mg by mouth daily., Disp: , Rfl:   Current Facility-Administered Medications:  .  bupivacaine (MARCAINE) 0.5 % (with pres) injection 50 mL, 50 mL, Other, Once, Magnus Sinning, MD .  lidocaine (PF) (XYLOCAINE) 1 % injection 0.3 mL, 0.3 mL, Other, Once, Magnus Sinning, MD .  methylPREDNISolone acetate (DEPO-MEDROL) injection 80 mg, 80 mg, Other, Once, Magnus Sinning, MD  Allergies:  Allergies  Allergen Reactions  . Nsaids Other (See  Comments) and Tinitus    Bruising   . Ciprofloxacin     Other reaction(s): Other (See Comments) Achilles tendon  . Statins     Other reaction(s): Kidney issues Muscle pain  . Requip [Ropinirole Hcl] Anxiety  . Ropinirole Anxiety    Mood swing    Past Medical History, Surgical history, Social history, and Family History were reviewed and updated.  Review of Systems: As above  Physical Exam:  weight is 222 lb 1.9 oz (100.8 kg). His oral temperature is 98.2 F (36.8 C). His blood pressure is 110/45 (abnormal) and his pulse is 81. His respiration is 16 and oxygen saturation is 95%.   Wt Readings from Last 3 Encounters:  11/12/16 222 lb 1.9 oz (100.8 kg)  11/05/16 219 lb 9.6 oz (99.6 kg)  10/25/16 217 lb (98.4 kg)     Head and neck exam shows no ocular or oral lesions. He has no palpable cervical or supraclavicular lymph nodes. Lungs are clear. Cardiac exam regular rate and rhythm with no murmurs, rubs or bruits. Abdomen is soft. He has good bowel sounds. There is no fluid wave. There is no palpable liver or spleen tip. Axillary exam shows no bilateral axillary adenopathy. Back exam shows no tenderness over the spine, ribs or hips. Extremities shows no clubbing, cyanosis or edema. He has good range of motion of his joints. He does have some osteoarthritic changes in his joints. Skin exam shows no rashes, ecchymoses or petechia. Neurological exam shows no focal neurological deficits.  Lab Results  Component Value Date   WBC 7.8 11/12/2016   HGB 10.6 (L) 11/12/2016   HCT 32.5 (L) 11/12/2016   MCV 107 (H) 11/12/2016   PLT 87 (L) 11/12/2016     Chemistry      Component Value Date/Time   NA 141 11/12/2016 1314   NA 141 05/15/2016 1126   K 4.8 (H) 11/12/2016 1314   K 4.3 05/15/2016 1126   CL 102 11/12/2016 1314   CO2 28 11/12/2016 1314   CO2 26 05/15/2016 1126   BUN 23 (H) 11/12/2016 1314   BUN 29.1 (H) 05/15/2016 1126   CREATININE 1.4 (H) 11/12/2016 1314   CREATININE 1.7  (H) 05/15/2016 1126   GLU 90 11/15/2014      Component Value Date/Time   CALCIUM 8.6 11/12/2016 1314   CALCIUM 9.7 05/15/2016 1126   ALKPHOS 62 11/12/2016 1314   ALKPHOS 69 05/15/2016 1126   AST 20 11/12/2016 1314   AST 23 05/15/2016 1126   ALT 24 11/12/2016 1314   ALT 22 05/15/2016 1126   BILITOT 0.80 11/12/2016 1314   BILITOT 0.84 05/15/2016 1126  Impression and Plan: Jeremiah Walker is a 83 year old white male. He had a history of diffuse large cell non-Hodgkin's lymphoma. He was treated  with 6 cycles of R-CHOP. He got this treatment up in [place]. He completed this back in April 2014.   There clearly is a change with his blood counts. His plantar count has dropped significantly. His monocytosis is worse.  I looked at his blood on the microscope. I do not see anything that looked leukemic. However, given that he has had past chemotherapy, he is at higher risk for myelodysplasia or possibly a hybrid MDS/MPN process, such as chronic myelomonocytic leukemia.   I think that we are at the point now where a bone marrow will have to be done. I talked to Jeremiah Walker about this at length. He is agreeable. I will see if radiology can do a bone marrow test next week on him.  I want to see him back in 3 weeks. By then, I will have all the results back that I need.  It will be very interesting to see what his erythropoietin level will be.   I spent  40 minutes with him today.     [name], MD 5/21/20182:56 PM

## 2016-11-13 ENCOUNTER — Other Ambulatory Visit: Payer: Self-pay | Admitting: Hematology & Oncology

## 2016-11-13 LAB — RETICULOCYTES: Reticulocyte Count: 1.4 % (ref 0.6–2.6)

## 2016-11-13 LAB — ERYTHROPOIETIN: Erythropoietin: 39 m[IU]/mL — ABNORMAL HIGH (ref 2.6–18.5)

## 2016-11-13 LAB — IRON AND TIBC
%SAT: 23 % (ref 20–55)
Iron: 51 ug/dL (ref 42–163)
TIBC: 225 ug/dL (ref 202–409)
UIBC: 174 ug/dL (ref 117–376)

## 2016-11-13 LAB — FERRITIN: Ferritin: 324 ng/ml — ABNORMAL HIGH (ref 22–316)

## 2016-11-14 ENCOUNTER — Other Ambulatory Visit: Payer: Self-pay | Admitting: Hematology & Oncology

## 2016-11-14 ENCOUNTER — Encounter (HOSPITAL_BASED_OUTPATIENT_CLINIC_OR_DEPARTMENT_OTHER): Payer: Self-pay | Admitting: Emergency Medicine

## 2016-11-14 ENCOUNTER — Emergency Department (HOSPITAL_BASED_OUTPATIENT_CLINIC_OR_DEPARTMENT_OTHER)
Admission: EM | Admit: 2016-11-14 | Discharge: 2016-11-14 | Disposition: A | Discharge status: Home / Self Care | Payer: Medicare Other | Attending: Emergency Medicine | Admitting: Emergency Medicine

## 2016-11-14 DIAGNOSIS — M109 Gout, unspecified: Secondary | ICD-10-CM | POA: Diagnosis not present

## 2016-11-14 DIAGNOSIS — N183 Chronic kidney disease, stage 3 (moderate): Secondary | ICD-10-CM | POA: Insufficient documentation

## 2016-11-14 DIAGNOSIS — Z87891 Personal history of nicotine dependence: Secondary | ICD-10-CM | POA: Diagnosis not present

## 2016-11-14 DIAGNOSIS — I129 Hypertensive chronic kidney disease with stage 1 through stage 4 chronic kidney disease, or unspecified chronic kidney disease: Secondary | ICD-10-CM | POA: Diagnosis not present

## 2016-11-14 DIAGNOSIS — M25571 Pain in right ankle and joints of right foot: Secondary | ICD-10-CM | POA: Diagnosis present

## 2016-11-14 DIAGNOSIS — D72821 Monocytosis (symptomatic): Secondary | ICD-10-CM

## 2016-11-14 MED ORDER — HYDROMORPHONE HCL 1 MG/ML IJ SOLN: 1.0000 mg | Freq: Once | INTRAMUSCULAR | Status: DC

## 2016-11-14 MED ORDER — DEXAMETHASONE SODIUM PHOSPHATE 10 MG/ML IJ SOLN
10.0000 mg | Freq: Once | INTRAMUSCULAR | Status: AC
  Administered 2016-11-14: 10 mg via INTRAMUSCULAR
  Filled 2016-11-14: qty 1

## 2016-11-14 MED ORDER — PREDNISONE 10 MG (21) PO TBPK: ORAL_TABLET | ORAL | 0 refills | Status: DC

## 2016-11-14 MED FILL — predniSONE 10 MG TABS: 10 | 8 days supply | Qty: 20 | Fill #0

## 2016-11-14 NOTE — ED Triage Notes (Signed)
Left ankle pain since Sat and left great toe pain this am. Denies recent injury

## 2016-11-14 NOTE — ED Provider Notes (Signed)
[facility] Provider Note   CSN: [id number] Arrival date & time: 11/14/16  8110     History   Chief Complaint Chief Complaint  Patient presents with  . Ankle Pain    HPI Jeremiah Walker is a 83 y.o. male.  Patient is an 34 are old male with a history of chronic kidney disease, diffuse large cell lymphoma treated with chemotherapy last year with current workup for possible bone marrow dysplasia who had recently been seen for gout earlier this month presents with a 5 day history of persistent left ankle pain radiating to the left great toe starting this morning. He denies any fever, other joint pains or significant swelling. He did notice some mild redness of the left MCP this morning. Patient states the gout in the right ankle on 10/25/2016 had completely resolved. He had followed up with PCP and had been given bethanechol which he completed on Friday and symptoms started on Saturday. No other recent medication changes.   The history is provided by the patient.  Ankle Pain      Past Medical History:  Diagnosis Date  . Anemia   . Anemia due to antineoplastic chemotherapy 08/15/2015  . Anxiety   . Arthritis   . Cancer ([place])   . Cataract   . Chronic kidney disease (CKD), stage III (moderate) 02/09/2015   Controlled  . Combined fat and carbohydrate induced hyperlipemia 02/09/2015   Controlled  . Depression   . DLBCL (diffuse large B cell lymphoma) ([place]) 08/15/2015  . Essential (primary) hypertension 02/09/2015  . Gastro-esophageal reflux disease without esophagitis 02/09/2015   Controlled  . Neuromuscular disorder ([place])   . Tinnitus     Patient Active Problem List   Diagnosis Date Noted  . Chronic pain syndrome 09/26/2015  . De Quervain's tenosynovitis, right 09/13/2015  . Anemia due to antineoplastic chemotherapy 08/15/2015  . DLBCL (diffuse large B cell lymphoma) ([place]) 08/15/2015  . Skin lesion of face 05/21/2015  . Peripheral neuropathy due to chemotherapy  ([place]) 02/09/2015  . Benign prostatic hyperplasia with urinary obstruction 02/09/2015  . Bilateral hearing loss 02/09/2015  . Low back pain 02/09/2015  . Chronic kidney disease (CKD), stage III (moderate) 02/09/2015  . Essential (primary) hypertension 02/09/2015  . Gastro-esophageal reflux disease without esophagitis 02/09/2015  . Anxiety, generalized 02/09/2015  . Cannot sleep 02/09/2015  . Combined fat and carbohydrate induced hyperlipemia 02/09/2015    Past Surgical History:  Procedure Laterality Date  . CATARACT EXTRACTION, BILATERAL    . COLONOSCOPY  May 2011  . CYSTOSCOPY    . TOTAL HIP ARTHROPLASTY  2003   Right   . TOTAL HIP ARTHROPLASTY  April, 2011   Left  . TRIGGER FINGER RELEASE  Nov 2013  . TURBINECTOMY  2006  . UPPER GI ENDOSCOPY  Nov 2015       Home Medications    Prior to Admission medications   Medication Sig Start Date End Date Taking? Authorizing Provider  ALPRAZolam Duanne Moron) 0.5 MG tablet Take 1 tablet (0.5 mg total) by mouth 3 (three) times daily as needed for anxiety. 10/03/16   Copland, Gay Filler, MD  amoxicillin (AMOXIL) 500 MG capsule Take 4 capsules (2,000 mg total) by mouth as directed. ONE HOUR PRIOR TO DENTAL PROCEDURES 11/23/15   Brunetta Jeans, PA-C  bethanechol (URECHOLINE) 25 MG tablet TAKE 1 TABLET BY MOUTH THREE TIMES DAILY 11/02/16   Copland, Gay Filler, MD  Calcium 500 MG tablet Take 600 mg by mouth daily.  [provider]  Cholecalciferol (VITAMIN D3) 2000 UNITS capsule Take 2,000 Units by mouth daily.    [provider]  diazepam (VALIUM) 5 MG tablet Take 1 tablet (5 mg total) by mouth every 12 (twelve) hours as needed for muscle spasms. Take instead of ALprazolam 04/23/16   Brunetta Jeans, PA-C  doxazosin (CARDURA) 2 MG tablet Take 1 tablet (2 mg total) by mouth 2 (two) times daily. 08/22/16   Copland, Gay Filler, MD  fluorouracil (EFUDEX) 5 % cream  02/21/16   [provider]  gabapentin (NEURONTIN) 300 MG  capsule TAKE 3 CAPSULES BY MOUTH THREE TIMES DAILY, PLUS 2 CAPSULES EVERY DAY AS NEEDED 08/27/16   Copland, Gay Filler, MD  gabapentin (NEURONTIN) 600 MG tablet Take 1.5 tablets (900 mg total) by mouth 3 (three) times daily. 10/03/16   Copland, Gay Filler, MD  HYDROcodone-acetaminophen (NORCO) 7.5-325 MG tablet Take 1 tablet by mouth every 6 (six) hours as needed for moderate pain. Ok to fill in 30 days 10/03/16   Copland, Gay Filler, MD  magnesium 30 MG tablet Take 30 mg by mouth 2 (two) times daily as needed.    [provider]  Multiple Vitamin (MULTIVITAMIN) tablet Take 1 tablet by mouth daily.    [provider]  niacin 500 MG tablet Take 500 mg by mouth at bedtime.    [provider]  Omega-3 Fatty Acids (OMEGA-3 FISH OIL PO) Take 2 each by mouth every morning. EPA/DHA 520/350    [provider]  polyethylene glycol (MIRALAX / GLYCOLAX) packet Take 17 g by mouth daily as needed.    [provider]  predniSONE (STERAPRED UNI-PAK 21 TAB) 10 MG (21) TBPK tablet Take 6 tabs by mouth daily  for 2 days, then 5 tabs for 2 days, then 4 tabs for 2 days, then 3 tabs for 2 days, 2 tabs for 2 days, then 1 tab by mouth daily for 2 days 10/25/16   Isla Pence, MD  psyllium (METAMUCIL) 58.6 % powder Take 1 packet by mouth daily. 1 Teaspoon daily    [provider]  S-Adenosylmethionine (SAM-E) 400 MG TABS Take 400 mg by mouth daily.    [provider]  vitamin C (ASCORBIC ACID) 500 MG tablet Take 500 mg by mouth daily.    [provider]  zinc gluconate 50 MG tablet Take 50 mg by mouth daily.    [provider]    Family History Family History  Problem Relation Age of Onset  . Anuerysm Mother 70       Deceased  . Colon cancer Father 45       Deceased  . Colon cancer Brother   . Prostate cancer Brother        Deceased  . Alzheimer's disease Brother   . Heart disease Brother   . Alzheimer's disease Paternal Grandmother   .  Early death Maternal Grandmother        Unknown  . Obesity Son   . Obesity Son        Gastric Bypass  . Healthy Son   . Healthy Daughter   . Diabetes Neg Hx     Social History Social History  Substance Use Topics  . Smoking status: Former Research scientist (life sciences)  . Smokeless tobacco: Never Used     Comment: Quit >50 years ago  . Alcohol use 0.6 - 2.4 oz/week    1 - 4 Shots of liquor per week     Allergies  Nsaids; Ciprofloxacin; Statins; Requip [ropinirole hcl]; and Ropinirole   Review of Systems Review of Systems  All other systems reviewed and are negative.    Physical Exam Updated Vital Signs BP 134/61 (BP Location: Right Arm)   Pulse 84   Temp 98.2 F (36.8 C) (Oral)   Resp 18   Ht _0  (1.753 m)   Wt 98.9 kg (218 lb)   SpO2 98%   BMI 32.19 kg/m   Physical Exam  Constitutional: He appears well-developed and well-nourished. No distress.  HENT:  Head: Normocephalic and atraumatic.  Cardiovascular: Normal rate.   Pulmonary/Chest: Effort normal.  Musculoskeletal: He exhibits tenderness.       Left ankle: Tenderness.       Feet:  Tenderness over the lateral ankle lateral proximal foot without significant erythema or swelling. Mild erythema and pain with palpation of the left first MTP. No significant pain with movement of the left great toe. Left knee is within normal limits  Neurological: He is alert.  Skin: Skin is warm and dry.  Psychiatric: He has a normal mood and affect. His behavior is normal.  Nursing note and vitals reviewed.    ED Treatments / Results  Labs (all labs ordered are listed, but only abnormal results are displayed) Labs Reviewed - No data to display  EKG  EKG Interpretation None       Radiology No results found.  Procedures Procedures (including critical care time)  Medications Ordered in ED Medications  HYDROmorphone (DILAUDID) injection 1 mg (1 mg Intramuscular Refused 11/14/16 1023)  dexamethasone (DECADRON) injection 10 mg  (10 mg Intramuscular Given 11/14/16 1021)     Initial Impression / Assessment and Plan / ED Course  I have reviewed the triage vital signs and the nursing notes.  Pertinent labs & imaging results that were available during my care of the patient were reviewed by me and considered in my medical decision making (see chart for details).     Patient is an 83 year old male presenting today with 5 day history of left ankle pain radiating into the great toe this morning. He denies any systemic symptoms different than what have spent ongoing for months which she is currently getting a workup by oncology. possible myelodysplastic syndrome after having chemotherapy for lymphoma. He will be getting a bone marrow biopsy next week. Patient had lab drawn 2 days ago which showed a baseline creatinine which is 1.4 mild hyperkalemia 4.8 but otherwise normal white count. Patient was recently diagnosed and treated with gout in the right ankle 20 days ago and was treated with prednisone and PCP placed him on bethanechol. Will give another round of prednisone as patient cannot take colchicine or NSAIDs due to an allergy in his renal function.  Patient may benefit from being on a long-term gout suppressor but discussed following up with his PCP for that  Final Clinical Impressions(s) / ED Diagnoses   Final diagnoses:  Acute gout involving toe of left foot, unspecified cause    New Prescriptions Current Discharge Medication List       Blanchie Dessert, MD 11/14/16 1030

## 2016-11-14 NOTE — ED Notes (Signed)
ED Provider at bedside. 

## 2016-11-15 ENCOUNTER — Ambulatory Visit (HOSPITAL_COMMUNITY): Payer: Medicare Other

## 2016-11-20 DIAGNOSIS — L578 Other skin changes due to chronic exposure to nonionizing radiation: Secondary | ICD-10-CM | POA: Diagnosis not present

## 2016-11-20 DIAGNOSIS — L570 Actinic keratosis: Secondary | ICD-10-CM | POA: Diagnosis not present

## 2016-11-20 DIAGNOSIS — Z9225 Personal history of immunosupression therapy: Secondary | ICD-10-CM | POA: Diagnosis not present

## 2016-11-20 DIAGNOSIS — Z08 Encounter for follow-up examination after completed treatment for malignant neoplasm: Secondary | ICD-10-CM | POA: Diagnosis not present

## 2016-11-20 DIAGNOSIS — Z85828 Personal history of other malignant neoplasm of skin: Secondary | ICD-10-CM | POA: Diagnosis not present

## 2016-11-21 ENCOUNTER — Other Ambulatory Visit: Payer: Self-pay | Admitting: Physician Assistant

## 2016-11-22 ENCOUNTER — Ambulatory Visit (HOSPITAL_COMMUNITY)
Admission: RE | Admit: 2016-11-22 | Discharge: 2016-11-22 | Disposition: A | Discharge status: Home / Self Care | Payer: Medicare Other | Source: Ambulatory Visit | Attending: Hematology & Oncology | Admitting: Hematology & Oncology

## 2016-11-22 ENCOUNTER — Encounter (HOSPITAL_COMMUNITY): Payer: Self-pay

## 2016-11-22 DIAGNOSIS — Z820 Family history of epilepsy and other diseases of the nervous system: Secondary | ICD-10-CM | POA: Diagnosis not present

## 2016-11-22 DIAGNOSIS — D7589 Other specified diseases of blood and blood-forming organs: Secondary | ICD-10-CM | POA: Insufficient documentation

## 2016-11-22 DIAGNOSIS — Z8042 Family history of malignant neoplasm of prostate: Secondary | ICD-10-CM | POA: Insufficient documentation

## 2016-11-22 DIAGNOSIS — E7801 Familial hypercholesterolemia: Secondary | ICD-10-CM | POA: Insufficient documentation

## 2016-11-22 DIAGNOSIS — R5383 Other fatigue: Secondary | ICD-10-CM | POA: Diagnosis not present

## 2016-11-22 DIAGNOSIS — Z888 Allergy status to other drugs, medicaments and biological substances status: Secondary | ICD-10-CM | POA: Diagnosis not present

## 2016-11-22 DIAGNOSIS — K219 Gastro-esophageal reflux disease without esophagitis: Secondary | ICD-10-CM | POA: Insufficient documentation

## 2016-11-22 DIAGNOSIS — F419 Anxiety disorder, unspecified: Secondary | ICD-10-CM | POA: Diagnosis not present

## 2016-11-22 DIAGNOSIS — Z8572 Personal history of non-Hodgkin lymphomas: Secondary | ICD-10-CM | POA: Diagnosis not present

## 2016-11-22 DIAGNOSIS — Z833 Family history of diabetes mellitus: Secondary | ICD-10-CM | POA: Diagnosis not present

## 2016-11-22 DIAGNOSIS — N183 Chronic kidney disease, stage 3 (moderate): Secondary | ICD-10-CM | POA: Insufficient documentation

## 2016-11-22 DIAGNOSIS — Z9221 Personal history of antineoplastic chemotherapy: Secondary | ICD-10-CM | POA: Insufficient documentation

## 2016-11-22 DIAGNOSIS — I129 Hypertensive chronic kidney disease with stage 1 through stage 4 chronic kidney disease, or unspecified chronic kidney disease: Secondary | ICD-10-CM | POA: Diagnosis not present

## 2016-11-22 DIAGNOSIS — D539 Nutritional anemia, unspecified: Secondary | ICD-10-CM | POA: Diagnosis not present

## 2016-11-22 DIAGNOSIS — Z87891 Personal history of nicotine dependence: Secondary | ICD-10-CM | POA: Insufficient documentation

## 2016-11-22 DIAGNOSIS — Z96643 Presence of artificial hip joint, bilateral: Secondary | ICD-10-CM | POA: Diagnosis not present

## 2016-11-22 DIAGNOSIS — Z800 Family history of malignant neoplasm of digestive organs: Secondary | ICD-10-CM | POA: Diagnosis not present

## 2016-11-22 DIAGNOSIS — D72821 Monocytosis (symptomatic): Secondary | ICD-10-CM | POA: Diagnosis not present

## 2016-11-22 DIAGNOSIS — Z9889 Other specified postprocedural states: Secondary | ICD-10-CM | POA: Insufficient documentation

## 2016-11-22 DIAGNOSIS — F329 Major depressive disorder, single episode, unspecified: Secondary | ICD-10-CM | POA: Diagnosis not present

## 2016-11-22 DIAGNOSIS — D696 Thrombocytopenia, unspecified: Secondary | ICD-10-CM | POA: Insufficient documentation

## 2016-11-22 DIAGNOSIS — Z79899 Other long term (current) drug therapy: Secondary | ICD-10-CM | POA: Insufficient documentation

## 2016-11-22 LAB — CBC
HCT: 31.6 % — ABNORMAL LOW (ref 39.0–52.0)
Hemoglobin: 10.5 g/dL — ABNORMAL LOW (ref 13.0–17.0)
MCH: 34 pg (ref 26.0–34.0)
MCHC: 33.2 g/dL (ref 30.0–36.0)
MCV: 102.3 fL — ABNORMAL HIGH (ref 78.0–100.0)
PLATELETS: 144 10*3/uL — AB (ref 150–400)
RBC: 3.09 MIL/uL — AB (ref 4.22–5.81)
RDW: 15.5 % (ref 11.5–15.5)
WBC: 6.8 10*3/uL (ref 4.0–10.5)

## 2016-11-22 LAB — PROTIME-INR
INR: 1.05
PROTHROMBIN TIME: 13.8 s (ref 11.4–15.2)

## 2016-11-22 LAB — APTT: APTT: 28 s (ref 24–36)

## 2016-11-22 MED ORDER — FENTANYL CITRATE (PF) 100 MCG/2ML IJ SOLN
INTRAMUSCULAR | Status: AC | PRN
  Administered 2016-11-22: 50 ug via INTRAVENOUS

## 2016-11-22 MED ORDER — MIDAZOLAM HCL 2 MG/2ML IJ SOLN
INTRAMUSCULAR | Status: AC | PRN
  Administered 2016-11-22: 1 mg via INTRAVENOUS

## 2016-11-22 MED ORDER — LIDOCAINE HCL (PF) 1 % IJ SOLN
INTRAMUSCULAR | Status: DC | PRN
  Administered 2016-11-22: 10 mL via INTRADERMAL

## 2016-11-22 MED ORDER — FENTANYL CITRATE (PF) 100 MCG/2ML IJ SOLN
INTRAMUSCULAR | Status: AC
  Filled 2016-11-22: qty 4

## 2016-11-22 MED ORDER — MIDAZOLAM HCL 2 MG/2ML IJ SOLN
INTRAMUSCULAR | Status: AC
  Filled 2016-11-22: qty 6

## 2016-11-22 MED ORDER — SODIUM CHLORIDE 0.9 % IV SOLN
INTRAVENOUS | Status: DC
  Administered 2016-11-22: 10:00:00 via INTRAVENOUS

## 2016-11-22 NOTE — Consult Note (Signed)
Chief Complaint: Patient was seen in consultation today for CT-guided bone marrow biopsy  Referring Physician(s): Ennever,Peter R  Supervising Physician: Markus Daft  Patient Status: [place]  History of Present Illness: Jeremiah Walker is a 83 y.o. male with history of non-Hodgkin's lymphoma (DLBCL) in 2013, status post chemotherapy in [place].He presents now with worsening monocytosis and thrombocytopenia and is scheduled today for CT-guided bone marrow biopsy for further evaluation.  Past Medical History:  Diagnosis Date  . Anemia   . Anemia due to antineoplastic chemotherapy 08/15/2015  . Anxiety   . Arthritis   . Cancer ([place])   . Cataract   . Chronic kidney disease (CKD), stage III (moderate) 02/09/2015   Controlled  . Combined fat and carbohydrate induced hyperlipemia 02/09/2015   Controlled  . Depression   . DLBCL (diffuse large B cell lymphoma) ([place]) 08/15/2015  . Essential (primary) hypertension 02/09/2015  . Gastro-esophageal reflux disease without esophagitis 02/09/2015   Controlled  . Neuromuscular disorder ([place])   . Tinnitus     Past Surgical History:  Procedure Laterality Date  . CATARACT EXTRACTION, BILATERAL    . COLONOSCOPY  May 2011  . CYSTOSCOPY    . TOTAL HIP ARTHROPLASTY  2003   Right   . TOTAL HIP ARTHROPLASTY  April, 2011   Left  . TRIGGER FINGER RELEASE  Nov 2013  . TURBINECTOMY  2006  . UPPER GI ENDOSCOPY  Nov 2015    Allergies: Nsaids; Ciprofloxacin; Statins; Requip [ropinirole hcl]; and Ropinirole  Medications: Prior to Admission medications   Medication Sig Start Date End Date Taking? Authorizing Provider  ALPRAZolam Duanne Moron) 0.5 MG tablet Take 1 tablet (0.5 mg total) by mouth 3 (three) times daily as needed for anxiety. 10/03/16   Copland, Gay Filler, MD  amoxicillin (AMOXIL) 500 MG capsule Take 4 capsules (2,000 mg total) by mouth as directed. ONE HOUR PRIOR TO DENTAL PROCEDURES 11/23/15   Brunetta Jeans, PA-C  bethanechol  (URECHOLINE) 25 MG tablet TAKE 1 TABLET BY MOUTH THREE TIMES DAILY 11/02/16   Copland, Gay Filler, MD  Calcium 500 MG tablet Take 600 mg by mouth daily.    [provider]  Cholecalciferol (VITAMIN D3) 2000 UNITS capsule Take 2,000 Units by mouth daily.    [provider]  diazepam (VALIUM) 5 MG tablet Take 1 tablet (5 mg total) by mouth every 12 (twelve) hours as needed for muscle spasms. Take instead of ALprazolam 04/23/16   Brunetta Jeans, PA-C  doxazosin (CARDURA) 2 MG tablet Take 1 tablet (2 mg total) by mouth 2 (two) times daily. 08/22/16   Copland, Gay Filler, MD  fluorouracil (EFUDEX) 5 % cream  02/21/16   [provider]  gabapentin (NEURONTIN) 300 MG capsule TAKE 3 CAPSULES BY MOUTH THREE TIMES DAILY, PLUS 2 CAPSULES EVERY DAY AS NEEDED 08/27/16   Copland, Gay Filler, MD  gabapentin (NEURONTIN) 600 MG tablet Take 1.5 tablets (900 mg total) by mouth 3 (three) times daily. 10/03/16   Copland, Gay Filler, MD  HYDROcodone-acetaminophen (NORCO) 7.5-325 MG tablet Take 1 tablet by mouth every 6 (six) hours as needed for moderate pain. Ok to Walker in 30 days 10/03/16   Copland, Gay Filler, MD  magnesium 30 MG tablet Take 30 mg by mouth 2 (two) times daily as needed.    [provider]  Multiple Vitamin (MULTIVITAMIN) tablet Take 1 tablet by mouth daily.    [provider]  niacin 500 MG tablet Take 500 mg by  mouth at bedtime.    [provider]  Omega-3 Fatty Acids (OMEGA-3 FISH OIL PO) Take 2 each by mouth every morning. EPA/DHA 520/350    [provider]  polyethylene glycol (MIRALAX / GLYCOLAX) packet Take 17 g by mouth daily as needed.    [provider]  predniSONE (STERAPRED UNI-PAK 21 TAB) 10 MG (21) TBPK tablet Take 4 tabs by mouth daily  for 2 days, then 3 tabs for 2 days, then 2 tabs for 2 days, then 1 tabs for 2 days 11/14/16   Blanchie Dessert, MD  psyllium (METAMUCIL) 58.6 % powder Take 1 packet by mouth daily. 1 Teaspoon  daily    [provider]  S-Adenosylmethionine (SAM-E) 400 MG TABS Take 400 mg by mouth daily.    [provider]  vitamin C (ASCORBIC ACID) 500 MG tablet Take 500 mg by mouth daily.    [provider]  zinc gluconate 50 MG tablet Take 50 mg by mouth daily.    [provider]     Family History  Problem Relation Age of Onset  . Anuerysm Mother 48       Deceased  . Colon cancer Father 43       Deceased  . Colon cancer Brother   . Prostate cancer Brother        Deceased  . Alzheimer's disease Brother   . Heart disease Brother   . Alzheimer's disease Paternal Grandmother   . Early death Maternal Grandmother        Unknown  . Obesity Son   . Obesity Son        Gastric Bypass  . Healthy Son   . Healthy Daughter   . Diabetes Neg Hx     Social History   Social History  . Marital status: Widowed    Spouse name: N/A  . Number of children: N/A  . Years of education: N/A   Social History Main Topics  . Smoking status: Former Research scientist (life sciences)  . Smokeless tobacco: Never Used     Comment: Quit >50 years ago  . Alcohol use 0.6 - 2.4 oz/week    1 - 4 Shots of liquor per week  . Drug use: No  . Sexual activity: Not on file   Other Topics Concern  . Not on file   Social History Narrative  . No narrative on file      Review of Systems Currently denies fever, headache, chest pain, dyspnea, cough, abdominal/back pain, nausea, vomiting or abnormal bleeding. He has had some weight loss, fatigue and bruises easily. He is also hard of hearing.  Vital Signs: Vitals:   11/22/16 0908  BP: 140/83  Pulse: 65  Resp: 16  Temp: 97.5 F (36.4 C)      Physical Exam Awake, alert. Chest clear to auscultation bilaterally. Heart with regular rate and rhythm. Abdomen obese, soft, positive bowel sounds, nontender. No lower extremity edema.  Mallampati Score:     Imaging: Dg Chest 2 View  Result Date: 11/05/2016 CLINICAL DATA:  Pt c/o recent onset of  fatigue (roughly 3 weeks), ex smoker, GERD, DLBCL EXAM: CHEST  2 VIEW COMPARISON:  None. FINDINGS: The cardiac silhouette is normal in size and configuration. No mediastinal or hilar masses. No evidence of adenopathy. Lungs are clear.  No pleural effusion.  No pneumothorax. Skeletal structures are demineralized but grossly intact. IMPRESSION: No active cardiopulmonary disease. Electronically Signed   By: Lajean Manes M.D.   On: 11/05/2016 13:35  Dg Ankle Complete Right  Result Date: 10/25/2016 CLINICAL DATA:  Right ankle pain for 2 days with no known injury EXAM: RIGHT ANKLE - COMPLETE 3+ VIEW COMPARISON:  None. FINDINGS: No acute fracture, malalignment, or erosion. Heterotopic ossification along the distal syndesmosis. Dorsal heel and malleolar enthesophytes. No degenerative narrowing or spurring. Mild arterial calcification. IMPRESSION: No acute osseous finding. Electronically Signed   By: Monte Fantasia M.D.   On: 10/25/2016 10:44    Labs:  CBC:  Recent Labs  05/15/16 1126 10/19/16 1046 11/05/16 1317 11/12/16 1314  WBC 6.1 6.9 12.5* 7.8  HGB 11.3* 10.4* 11.8* 10.6*  HCT 34.2* 31.8* 35.9* 32.5*  PLT 126* 133* 149.0* 87*    COAGS: No results for input(s): INR, APTT in the last 8760 hours.  BMP:  Recent Labs  04/06/16 1047 05/15/16 1126 11/05/16 1317 11/12/16 1314  NA 141 141 137 141  K 4.7 4.3 3.9 4.8*  CL  --   --  99 102  CO2 '27 26 31 28  ' GLUCOSE 95 95 99 107  BUN 27.7* 29.1* 33* 23*  CALCIUM 9.4 9.7 9.7 8.6  CREATININE 1.4* 1.7* 1.54* 1.4*    LIVER FUNCTION TESTS:  Recent Labs  04/06/16 1047 05/15/16 1126 11/05/16 1317 11/12/16 1314  BILITOT 0.74 0.84 0.8 0.80  AST '17 23 19 20  ' ALT 10 22 35 24  ALKPHOS 60 69 61 62  PROT 7.4 7.3 7.1 6.5  ALBUMIN 3.7 3.6 4.0 3.2*    TUMOR MARKERS: No results for input(s): AFPTM, CEA, CA199, CHROMGRNA in the last 8760 hours.  Assessment and Plan: 83 y.o. male with history of non-Hodgkin's lymphoma (DLBCL) in 2013,  status post chemotherapy in [place].He presents now with worsening monocytosis and thrombocytopenia and is scheduled today for CT-guided bone marrow biopsy for further evaluation.Risks and benefits discussed with the patient/son including, but not limited to bleeding, infection, damage to adjacent structures or low yield requiring additional tests.All of the patient's questions were answered, patient is agreeable to proceed.Consent signed and in chart.    Thank you for this interesting consult.  I greatly enjoyed Jeremiah Walker and look forward to participating in their care.  A copy of this report was sent to the requesting provider on this date.  Electronically Signed: D. Rowe Robert, PA-C 11/22/2016, 9:06 AM   I spent a total of 20 minutes  in face to face in clinical consultation, greater than 50% of which was counseling/coordinating care for CT-guided bone marrow biopsy

## 2016-11-22 NOTE — Procedures (Signed)
CT guided bone marrow biopsy.  2 aspirates and 2 cores from right ilium.  Minimal blood loss.  No immediate complication.   

## 2016-11-22 NOTE — Discharge Instructions (Signed)
Bone Marrow Aspiration and Bone Marrow Biopsy, Adult, Care After °This sheet gives you information about how to care for yourself after your procedure. Your health care provider may also give you more specific instructions. If you have problems or questions, contact your health care provider. °What can I expect after the procedure? °After the procedure, it is common to have: °· Mild pain and tenderness. °· Swelling. °· Bruising. ° °Follow these instructions at home: °· Take over-the-counter or prescription medicines only as told by your health care provider. °· Do not take baths, swim, or use a hot tub until your health care provider approves. Ask if you can take a shower or have a sponge bath. °· Follow instructions from your health care provider about how to take care of the puncture site. Make sure you: °? Wash your hands with soap and water before you change your bandage (dressing). If soap and water are not available, use hand sanitizer. °? Change your dressing as told by your health care provider. °· Check your puncture site every day for signs of infection. Check for: °? More redness, swelling, or pain. °? More fluid or blood. °? Warmth. °? Pus or a bad smell. °· Return to your normal activities as told by your health care provider. Ask your health care provider what activities are safe for you. °· Do not drive for 24 hours if you were given a medicine to help you relax (sedative). °· Keep all follow-up visits as told by your health care provider. This is important. °Contact a health care provider if: °· You have more redness, swelling, or pain around the puncture site. °· You have more fluid or blood coming from the puncture site. °· Your puncture site feels warm to the touch. °· You have pus or a bad smell coming from the puncture site. °· You have a fever. °· Your pain is not controlled with medicine. °This information is not intended to replace advice given to you by your health care provider. Make sure  you discuss any questions you have with your health care provider. °Document Released: 12/29/2004 Document Revised: 12/30/2015 Document Reviewed: 11/23/2015 °Elsevier Interactive Patient Education © 2018 Elsevier Inc[employer] °Moderate Conscious Sedation, Adult, Care After °These instructions provide you with information about caring for yourself after your procedure. Your health care provider may also give you more specific instructions. Your treatment has been planned according to current medical practices, but problems sometimes occur. Call your health care provider if you have any problems or questions after your procedure. °What can I expect after the procedure? °After your procedure, it is common: °· To feel sleepy for several hours. °· To feel clumsy and have poor balance for several hours. °· To have poor judgment for several hours. °· To vomit if you eat too soon. ° °Follow these instructions at home: °For at least 24 hours after the procedure: ° °· Do not: °? Participate in activities where you could fall or become injured. °? Drive. °? Use heavy machinery. °? Drink alcohol. °? Take sleeping pills or medicines that cause drowsiness. °? Make important decisions or sign legal documents. °? Take care of children on your own. °· Rest. °Eating and drinking °· Follow the diet recommended by your health care provider. °· If you vomit: °? Drink water, juice, or soup when you can drink without vomiting. °? Make sure you have little or no nausea before eating solid foods. °General instructions °· Have a responsible adult stay with you until you are   awake and alert.  Take over-the-counter and prescription medicines only as told by your health care provider.  If you smoke, do not smoke without supervision.  Keep all follow-up visits as told by your health care provider. This is important. Contact a health care provider if:  You keep feeling nauseous or you keep vomiting.  You feel light-headed.  You develop a  rash.  You have a fever. Get help right away if:  You have trouble breathing. This information is not intended to replace advice given to you by your health care provider. Make sure you discuss any questions you have with your health care provider. Document Released: 04/01/2013 Document Revised: 11/14/2015 Document Reviewed: 10/01/2015 Elsevier Interactive Patient Education  [employer].

## 2016-11-29 ENCOUNTER — Encounter: Payer: Self-pay | Admitting: Family Medicine

## 2016-11-29 ENCOUNTER — Other Ambulatory Visit: Payer: Self-pay | Admitting: Family Medicine

## 2016-11-30 ENCOUNTER — Emergency Department (HOSPITAL_BASED_OUTPATIENT_CLINIC_OR_DEPARTMENT_OTHER)
Admission: EM | Admit: 2016-11-30 | Discharge: 2016-11-30 | Disposition: A | Discharge status: Home / Self Care | Payer: Medicare Other | Attending: Emergency Medicine | Admitting: Emergency Medicine

## 2016-11-30 ENCOUNTER — Encounter (HOSPITAL_BASED_OUTPATIENT_CLINIC_OR_DEPARTMENT_OTHER): Payer: Self-pay | Admitting: *Deleted

## 2016-11-30 DIAGNOSIS — I130 Hypertensive heart and chronic kidney disease with heart failure and stage 1 through stage 4 chronic kidney disease, or unspecified chronic kidney disease: Secondary | ICD-10-CM | POA: Diagnosis not present

## 2016-11-30 DIAGNOSIS — M25571 Pain in right ankle and joints of right foot: Secondary | ICD-10-CM | POA: Diagnosis not present

## 2016-11-30 DIAGNOSIS — Z87891 Personal history of nicotine dependence: Secondary | ICD-10-CM | POA: Insufficient documentation

## 2016-11-30 DIAGNOSIS — Z79899 Other long term (current) drug therapy: Secondary | ICD-10-CM | POA: Insufficient documentation

## 2016-11-30 DIAGNOSIS — C8330 Diffuse large B-cell lymphoma, unspecified site: Secondary | ICD-10-CM | POA: Diagnosis not present

## 2016-11-30 DIAGNOSIS — N189 Chronic kidney disease, unspecified: Secondary | ICD-10-CM | POA: Diagnosis not present

## 2016-11-30 DIAGNOSIS — N183 Chronic kidney disease, stage 3 (moderate): Secondary | ICD-10-CM | POA: Diagnosis not present

## 2016-11-30 DIAGNOSIS — I509 Heart failure, unspecified: Secondary | ICD-10-CM | POA: Diagnosis not present

## 2016-11-30 MED ORDER — PREDNISONE 20 MG PO TABS: 40.0000 mg | ORAL_TABLET | Freq: Every day | ORAL | 0 refills | Status: DC

## 2016-11-30 MED FILL — predniSONE 20 MG TABS: 20 | 3 days supply | Qty: 6 | Fill #0

## 2016-11-30 NOTE — ED Triage Notes (Signed)
Pt reports sudden onset of right ankle pain at 5am, felt fine yesterday, denies injury or trauma, states this is his typical presentation.

## 2016-11-30 NOTE — ED Notes (Signed)
Pt refused wheelchair. Ambulating with a walker, steady gait. denys injury.

## 2016-11-30 NOTE — ED Notes (Signed)
ED Provider at bedside. 

## 2016-11-30 NOTE — ED Provider Notes (Signed)
[place] [facility]PT [facility] Provider Note   CSN: [id number] Arrival date & time: 11/30/16  1012     History   Chief Complaint Chief Complaint  Patient presents with  . Ankle Pain    HPI Jeremiah Walker is a 83 y.o. male.  HPI Patient presents with acute right ankle pain that began at approximate 5 AM without known trauma.  He has known history of gout.  He states this feels like developing gout in his right ankle.  He took a hydrocodone prior to arrival with some improvement in his pain.  He reports improvement with steroids in the past when he has flares such this.  No new swelling or redness of the joint.  No fevers or chills.  Pain is only mild in severity at this time after the hydrocodone he received at home.   Past Medical History:  Diagnosis Date  . Anemia   . Anemia due to antineoplastic chemotherapy 08/15/2015  . Anxiety   . Arthritis   . Cancer ([place])   . Cataract   . Chronic kidney disease (CKD), stage III (moderate) 02/09/2015   Controlled  . Combined fat and carbohydrate induced hyperlipemia 02/09/2015   Controlled  . Depression   . DLBCL (diffuse large B cell lymphoma) ([place]) 08/15/2015  . Essential (primary) hypertension 02/09/2015  . Gastro-esophageal reflux disease without esophagitis 02/09/2015   Controlled  . Neuromuscular disorder ([place])   . Tinnitus     Patient Active Problem List   Diagnosis Date Noted  . Chronic pain syndrome 09/26/2015  . De Quervain's tenosynovitis, right 09/13/2015  . Anemia due to antineoplastic chemotherapy 08/15/2015  . DLBCL (diffuse large B cell lymphoma) ([place]) 08/15/2015  . Skin lesion of face 05/21/2015  . 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 Benign prostatic hyperplasia with urinary obstruction 02/09/2015  . Bilateral hearing loss 02/09/2015  . Low back pain 02/09/2015  . Chronic kidney disease (CKD), stage III (moderate) 02/09/2015  . Essential (primary) hypertension 02/09/2015  .  Gastro-esophageal reflux disease without esophagitis 02/09/2015  . Anxiety, generalized 02/09/2015  . Cannot sleep 02/09/2015  . Combined fat and carbohydrate induced hyperlipemia 02/09/2015    Past Surgical History:  Procedure Laterality Date  . CATARACT EXTRACTION, BILATERAL    . COLONOSCOPY  May 2011  . CYSTOSCOPY    . TOTAL HIP ARTHROPLASTY  2003   Right   . TOTAL HIP ARTHROPLASTY  April, 2011   Left  . TRIGGER FINGER RELEASE  Nov 2013  . TURBINECTOMY  2006  . UPPER GI ENDOSCOPY  Nov 2015       Home Medications    Prior to Admission medications   Medication Sig Start Date End Date Taking? Authorizing Provider  ALPRAZolam Duanne Moron) 0.5 MG tablet Take 1 tablet (0.5 mg total) by mouth 3 (three) times daily as needed for anxiety. 10/03/16   Copland, Gay Filler, MD  amoxicillin (AMOXIL) 500 MG capsule Take 4 capsules (2,000 mg total) by mouth as directed. ONE HOUR PRIOR TO DENTAL PROCEDURES 11/23/15   Brunetta Jeans, PA-C  bethanechol (URECHOLINE) 25 MG tablet TAKE 1 TABLET BY MOUTH THREE TIMES DAILY 11/30/16   Copland, Gay Filler, MD  Calcium 500 MG tablet Take 600 mg by mouth daily.    [provider]  Cholecalciferol (VITAMIN D3) 2000 UNITS capsule Take 2,000 Units by mouth daily.    [provider]  diazepam (VALIUM) 5 MG tablet Take 1 tablet (5 mg total) by mouth every 12 (twelve)  hours as needed for muscle spasms. Take instead of ALprazolam 04/23/16   Brunetta Jeans, PA-C  doxazosin (CARDURA) 2 MG tablet Take 1 tablet (2 mg total) by mouth 2 (two) times daily. 08/22/16   Copland, Gay Filler, MD  fluorouracil (EFUDEX) 5 % cream  02/21/16   [provider]  gabapentin (NEURONTIN) 300 MG capsule TAKE 3 CAPSULES BY MOUTH THREE TIMES DAILY, PLUS 2 CAPSULES EVERY DAY AS NEEDED 08/27/16   Copland, Gay Filler, MD  HYDROcodone-acetaminophen (NORCO) 7.5-325 MG tablet Take 1 tablet by mouth every 6 (six) hours as needed for moderate pain. Ok to fill in 30 days 10/03/16    Copland, Gay Filler, MD  magnesium 30 MG tablet Take 30 mg by mouth 2 (two) times daily as needed.    [provider]  Multiple Vitamin (MULTIVITAMIN) tablet Take 1 tablet by mouth daily.    [provider]  niacin 500 MG tablet Take 500 mg by mouth at bedtime.    [provider]  Omega-3 Fatty Acids (OMEGA-3 FISH OIL PO) Take 2 each by mouth every morning. EPA/DHA 520/350    [provider]  polyethylene glycol (MIRALAX / GLYCOLAX) packet Take 17 g by mouth daily as needed.    [provider]  predniSONE (DELTASONE) 20 MG tablet Take 2 tablets (40 mg total) by mouth daily. 11/30/16 12/03/16  Jola Schmidt, MD  psyllium (METAMUCIL) 58.6 % powder Take 1 packet by mouth daily. 1 Teaspoon daily    [provider]  S-Adenosylmethionine (SAM-E) 400 MG TABS Take 400 mg by mouth daily.    [provider]  vitamin C (ASCORBIC ACID) 500 MG tablet Take 500 mg by mouth daily.    [provider]  zinc gluconate 50 MG tablet Take 50 mg by mouth daily.    [provider]    Family History Family History  Problem Relation Age of Onset  . Anuerysm Mother 56       Deceased  . Colon cancer Father 76       Deceased  . Colon cancer Brother   . Prostate cancer Brother        Deceased  . Alzheimer's disease Brother   . Heart disease Brother   . Alzheimer's disease Paternal Grandmother   . Early death Maternal Grandmother        Unknown  . Obesity Son   . Obesity Son        Gastric Bypass  . Healthy Son   . Healthy Daughter   . Diabetes Neg Hx     Social History Social History  Substance Use Topics  . Smoking status: Former Research scientist (life sciences)  . Smokeless tobacco: Never Used     Comment: Quit >50 years ago  . Alcohol use 0.6 - 2.4 oz/week    1 - 4 Shots of liquor per week     Allergies   Nsaids; Ciprofloxacin; Statins; Requip [ropinirole hcl]; and Ropinirole   Review of Systems Review of Systems  All other systems  reviewed and are negative.    Physical Exam Updated Vital Signs BP (!) 124/57 (BP Location: Left Arm)   Pulse 94   Temp 98.3 F (36.8 C) (Oral)   Resp 18   Ht 5\' 9"  (1.753 m)   Wt 97.5 kg (215 lb)   SpO2 97%   BMI 31.75 kg/m   Physical Exam  Constitutional: He is oriented to person, place, and time. He appears well-developed and well-nourished.  HENT:  Head:  Normocephalic.  Eyes: EOM are normal.  Neck: Normal range of motion.  Pulmonary/Chest: Effort normal.  Abdominal: He exhibits no distension.  Musculoskeletal:  Normal PT and DP pulse of right foot.  No swelling of the right lower extremity as compared to left.  No erythema or swelling of the right ankle joint.  Mild pain with range of motion of the right ankle.  No wounds noted to his right lower extremity  Neurological: He is alert and oriented to person, place, and time.  Psychiatric: He has a normal mood and affect.  Nursing note and vitals reviewed.    ED Treatments / Results  Labs (all labs ordered are listed, but only abnormal results are displayed) Labs Reviewed - No data to display  EKG  EKG Interpretation None       Radiology No results found.  Procedures Procedures (including critical care time)  Medications Ordered in ED Medications - No data to display   Initial Impression / Assessment and Plan / ED Course  I have reviewed the triage vital signs and the nursing notes.  Pertinent labs & imaging results that were available during my care of the patient were reviewed by me and considered in my medical decision making (see chart for details).     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  Home with a course of steroids.  Improvement in his pain with his home hydrocodone.  He has more hydrocodone at home.  Close primary care follow-up.  Doubt septic joint.  Normal pulses.  Doubt DVT.  No overlying skin infection  Final Clinical Impressions(s) / ED Diagnoses   Final diagnoses:  Acute right  ankle pain    New Prescriptions Discharge Medication List as of 11/30/2016 10:43 AM    START taking these medications   Details  predniSONE (DELTASONE) 20 MG tablet Take 2 tablets (40 mg total) by mouth daily., Starting Fri 11/30/2016, Until Mon 12/03/2016, Print         Jola Schmidt, MD 11/30/16 1200

## 2016-12-02 NOTE — Progress Notes (Addendum)
[place] [facility] [address] [place], [place] [address] [phone]  Date:  12/03/2016   Name:  Jeremiah Walker   DOB:  [birth year]   MRN:  [id number]  PCP:  Jeremiah Mclean, MD    Chief Complaint: Fatigue (Discuss if doxazosin may be the resaon for fatigue and if so pt would like to discuss alternative mediation. )   History of Present Illness:  Jeremiah Walker is a 83 y.o. very pleasant male patient who presents with the following:  Jeremiah Walker recently contacted me over mychart with a question about coming off his Doxazosin due to associated fatigue.  See mychart conversation from last week  I last saw him in May- he had noted a lot of fatigue which he attributes to using doxazosin. He stopped taking this last week and already feels much better.   He felt better the next day, and is still doing fine with passing his urine He is getting back into exercise, and feels that he is getting his strength back  BP Readings from Last 3 Encounters:  12/03/16 112/72  11/30/16 (!) 124/57  11/22/16 137/69   If has not noted any high BP since his medication change, and he can monitor this at home - he will continue to keep an eye on it  No belly pain or urinary sx  He has visited the ER a few times recently with gout- he has been treated with a few days of prednisone in the past and this worked fine.  Would like to have a few pills to have on hand in case of recurrent sx Gout is a new problem for him  He had a recent bone marrow per Jeremiah Walker- he has CML it appears.  He did have lymphoma in the past  He is seeing Jeremiah Walker tomorrow to discuss in more detail  Patient Active Problem List   Diagnosis Date Noted  . Chronic pain syndrome 09/26/2015  . De Quervain's tenosynovitis, right 09/13/2015  . Anemia due to antineoplastic chemotherapy 08/15/2015  . DLBCL (diffuse large B cell lymphoma) ([place]) 08/15/2015  . Skin lesion of face  05/21/2015  . Peripheral neuropathy due to chemotherapy ([place]) 02/09/2015  . Benign prostatic hyperplasia with urinary obstruction 02/09/2015  . Bilateral hearing loss 02/09/2015  . Low back pain 02/09/2015  . Chronic kidney disease (CKD), stage III (moderate) 02/09/2015  . Essential (primary) hypertension 02/09/2015  . Gastro-esophageal reflux disease without esophagitis 02/09/2015  . Anxiety, generalized 02/09/2015  . Cannot sleep 02/09/2015  . Combined fat and carbohydrate induced hyperlipemia 02/09/2015    Past Medical History:  Diagnosis Date  . Anemia   . Anemia due to antineoplastic chemotherapy 08/15/2015  . Anxiety   . Arthritis   . Cancer ([place])   . Cataract   . Chronic kidney disease (CKD), stage III (moderate) 02/09/2015   Controlled  . Combined fat and carbohydrate induced hyperlipemia 02/09/2015   Controlled  . Depression   . DLBCL (diffuse large B cell lymphoma) ([place]) 08/15/2015  . Essential (primary) hypertension 02/09/2015  . Gastro-esophageal reflux disease without esophagitis 02/09/2015   Controlled  . Neuromuscular disorder ([place])   . Tinnitus     Past Surgical History:  Procedure Laterality Date  . CATARACT EXTRACTION, BILATERAL    . COLONOSCOPY  May 2011  . CYSTOSCOPY    . TOTAL HIP ARTHROPLASTY  2003   Right   . TOTAL HIP ARTHROPLASTY  April,  2011   Left  . TRIGGER FINGER RELEASE  Nov 2013  . TURBINECTOMY  2006  . UPPER GI ENDOSCOPY  Nov 2015    Social History  Substance Use Topics  . Smoking status: Former Research scientist (life sciences)  . Smokeless tobacco: Never Used     Comment: Quit >50 years ago  . Alcohol use 0.6 - 2.4 oz/week    1 - 4 Shots of liquor per week    Family History  Problem Relation Age of Onset  . Anuerysm Mother 50       Deceased  . Colon cancer Father 54       Deceased  . Colon cancer Brother   . Prostate cancer Brother        Deceased  . Alzheimer's disease Brother   . Heart disease Brother   . Alzheimer's disease Paternal Grandmother    . Early death Maternal Grandmother        Unknown  . Obesity Son   . Obesity Son        Gastric Bypass  . Healthy Son   . Healthy Daughter   . Diabetes Neg Hx     Allergies  Allergen Reactions  . Nsaids Other (See Comments) and Tinitus    Bruising   . Ciprofloxacin     Other reaction(s): Other (See Comments) Achilles tendon  . Statins     Other reaction(s): Kidney issues Muscle pain  . Requip [Ropinirole Hcl] Anxiety  . Ropinirole Anxiety    Mood swing    Medication list has been reviewed and updated.  Current Outpatient Prescriptions on File Prior to Visit  Medication Sig Dispense Refill  . ALPRAZolam (XANAX) 0.5 MG tablet Take 1 tablet (0.5 mg total) by mouth 3 (three) times daily as needed for anxiety. 90 tablet 2  . amoxicillin (AMOXIL) 500 MG capsule Take 4 capsules (2,000 mg total) by mouth as directed. ONE HOUR PRIOR TO DENTAL PROCEDURES 4 capsule 1  . bethanechol (URECHOLINE) 25 MG tablet TAKE 1 TABLET BY MOUTH THREE TIMES DAILY 90 tablet 0  . Calcium 500 MG tablet Take 600 mg by mouth daily.    . Cholecalciferol (VITAMIN D3) 2000 UNITS capsule Take 2,000 Units by mouth daily.    . diazepam (VALIUM) 5 MG tablet Take 1 tablet (5 mg total) by mouth every 12 (twelve) hours as needed for muscle spasms. Take instead of ALprazolam 10 tablet 1  . doxazosin (CARDURA) 2 MG tablet Take 1 tablet (2 mg total) by mouth 2 (two) times daily. 180 tablet 1  . fluorouracil (EFUDEX) 5 % cream     . gabapentin (NEURONTIN) 300 MG capsule TAKE 3 CAPSULES BY MOUTH THREE TIMES DAILY, PLUS 2 CAPSULES EVERY DAY AS NEEDED 990 capsule 0  . HYDROcodone-acetaminophen (NORCO) 7.5-325 MG tablet Take 1 tablet by mouth every 6 (six) hours as needed for moderate pain. Ok to fill in 30 days 100 tablet 0  . magnesium 30 MG tablet Take 30 mg by mouth 2 (two) times daily as needed.    . Multiple Vitamin (MULTIVITAMIN) tablet Take 1 tablet by mouth daily.    . niacin 500 MG tablet Take 500 mg by mouth at  bedtime.    . Omega-3 Fatty Acids (OMEGA-3 FISH OIL PO) Take 2 each by mouth every morning. EPA/DHA 520/350    . polyethylene glycol (MIRALAX / GLYCOLAX) packet Take 17 g by mouth daily as needed.    . predniSONE (DELTASONE) 20 MG tablet Take 2 tablets (40 mg  total) by mouth daily. 6 tablet 0  . psyllium (METAMUCIL) 58.6 % powder Take 1 packet by mouth daily. 1 Teaspoon daily    . S-Adenosylmethionine (SAM-E) 400 MG TABS Take 400 mg by mouth daily.    . vitamin C (ASCORBIC ACID) 500 MG tablet Take 500 mg by mouth daily.    Marland Kitchen zinc gluconate 50 MG tablet Take 50 mg by mouth daily.     Current Facility-Administered Medications on File Prior to Visit  Medication Dose Route Frequency Provider Last Rate Last Dose  . bupivacaine (MARCAINE) 0.5 % (with pres) injection 50 mL  50 mL Other Once Magnus Sinning, MD      . lidocaine (PF) (XYLOCAINE) 1 % injection 0.3 mL  0.3 mL Other Once Magnus Sinning, MD      . methylPREDNISolone acetate (DEPO-MEDROL) injection 80 mg  80 mg Other Once Magnus Sinning, MD        Review of Systems:  As per HPI- otherwise negative.   Physical Examination: Vitals:   12/03/16 1230  BP: 112/72  Pulse: 84  Temp: 98.1 F (36.7 C)   Vitals:   12/03/16 1230  Weight: 218 lb 12.8 oz (99.2 kg)   Body mass index is 32.31 kg/m. Ideal Body Weight:    GEN: WDWN, NAD, Non-toxic, A & O x 3 HEENT: Atraumatic, Normocephalic. Neck supple. No masses, No LAD. Ears and Nose: No external deformity. CV: RRR, No M/G/R. No JVD. No thrill. No extra heart sounds. PULM: CTA B, no wheezes, crackles, rhonchi. No retractions. No resp. distress. No accessory muscle use. ABD: S, NT, ND, +BS. No rebound. No HSM. EXTR: No c/c/e NEURO Normal gait.  PSYCH: Normally interactive. Conversant. Not depressed or anxious appearing.  Calm demeanor.  Looks well, central obesity   Assessment and Plan: Acute idiopathic gout, unspecified site - Plan: predniSONE (DELTASONE) 20 MG tablet, Uric  acid  Hyperkalemia - Plan: Basic metabolic panel  Bone marrow aplasia (HCC)  Given rx for prednisone to hold and use if a gout flare- however he is also to let me know in this case. May use pred 40 for 3 days Ordered a uric acid and BMP- however he will have these drawn tomorrow when he comes in for labs for Dr. Johnette Abraham  Discussed his bone marrow findings to best of my ability- he will see Jeremiah Walker tomorrow   Signed Lamar Blinks, MD  12/07/16 Received his uric acid level- it is indeed high Lab Results  Component Value Date   LABURIC 9.2 (H) 12/04/2016

## 2016-12-03 ENCOUNTER — Other Ambulatory Visit: Payer: Self-pay | Admitting: *Deleted

## 2016-12-03 ENCOUNTER — Ambulatory Visit (INDEPENDENT_AMBULATORY_CARE_PROVIDER_SITE_OTHER): Payer: Medicare Other | Admitting: Family Medicine

## 2016-12-03 VITALS — BP 112/72 | HR 84 | Temp 98.1°F | Wt 218.8 lb

## 2016-12-03 DIAGNOSIS — C8333 Diffuse large B-cell lymphoma, intra-abdominal lymph nodes: Secondary | ICD-10-CM

## 2016-12-03 DIAGNOSIS — E875 Hyperkalemia: Secondary | ICD-10-CM

## 2016-12-03 DIAGNOSIS — M1000 Idiopathic gout, unspecified site: Secondary | ICD-10-CM

## 2016-12-03 DIAGNOSIS — D619 Aplastic anemia, unspecified: Secondary | ICD-10-CM | POA: Diagnosis not present

## 2016-12-03 MED ORDER — PREDNISONE 20 MG PO TABS: 40.0000 mg | ORAL_TABLET | Freq: Every day | ORAL | 0 refills | Status: AC

## 2016-12-03 MED FILL — predniSONE 20 MG TABS: 20 | 12 days supply | Qty: 12 | Fill #0

## 2016-12-03 NOTE — Patient Instructions (Signed)
It was very nice to see you today!  I think you are fine to stay off the doxazosin- however do watch for any sign of urinary retention/ difficulty with urination  I will check a uric acid level today- this is the chemical that causes gout.   If your level is high we may try a different medication for you. However I also gave you an rx for prednisone to hold and use if you have another attack.    Take care and I will watch for Dr. Antonieta Pert note tomorrow

## 2016-12-04 ENCOUNTER — Other Ambulatory Visit (HOSPITAL_BASED_OUTPATIENT_CLINIC_OR_DEPARTMENT_OTHER): Payer: Medicare Other

## 2016-12-04 ENCOUNTER — Other Ambulatory Visit: Payer: Medicare Other

## 2016-12-04 ENCOUNTER — Ambulatory Visit: Payer: Medicare Other | Admitting: Hematology & Oncology

## 2016-12-04 ENCOUNTER — Ambulatory Visit (HOSPITAL_BASED_OUTPATIENT_CLINIC_OR_DEPARTMENT_OTHER): Payer: Medicare Other | Admitting: Hematology & Oncology

## 2016-12-04 VITALS — BP 124/66 | HR 85 | Temp 97.8°F | Resp 16 | Wt 219.0 lb

## 2016-12-04 DIAGNOSIS — Z8572 Personal history of non-Hodgkin lymphomas: Secondary | ICD-10-CM | POA: Diagnosis not present

## 2016-12-04 DIAGNOSIS — C9310 Chronic myelomonocytic leukemia not having achieved remission: Secondary | ICD-10-CM

## 2016-12-04 DIAGNOSIS — C8333 Diffuse large B-cell lymphoma, intra-abdominal lymph nodes: Secondary | ICD-10-CM

## 2016-12-04 LAB — CBC WITH DIFFERENTIAL (CANCER CENTER ONLY)
BASO#: 0.1 10*3/uL (ref 0.0–0.2)
BASO%: 2.2 % — ABNORMAL HIGH (ref 0.0–2.0)
EOS ABS: 0.1 10*3/uL (ref 0.0–0.5)
EOS%: 1 % (ref 0.0–7.0)
HCT: 34.2 % — ABNORMAL LOW (ref 38.7–49.9)
HEMOGLOBIN: 11.3 g/dL — AB (ref 13.0–17.1)
LYMPH#: 2.1 10*3/uL (ref 0.9–3.3)
LYMPH%: 42 % (ref 14.0–48.0)
MCH: 35.1 pg — AB (ref 28.0–33.4)
MCHC: 33 g/dL (ref 32.0–35.9)
MCV: 106 fL — ABNORMAL HIGH (ref 82–98)
MONO#: 1.8 10*3/uL — ABNORMAL HIGH (ref 0.1–0.9)
MONO%: 35.7 % — AB (ref 0.0–13.0)
NEUT%: 19.1 % — AB (ref 40.0–80.0)
NEUTROS ABS: 1 10*3/uL — AB (ref 1.5–6.5)
Platelets: 158 10*3/uL (ref 145–400)
RBC: 3.22 10*6/uL — ABNORMAL LOW (ref 4.20–5.70)
RDW: 14.5 % (ref 11.1–15.7)
WBC: 5 10*3/uL (ref 4.0–10.0)

## 2016-12-04 LAB — CMP (CANCER CENTER ONLY)
ALT(SGPT): 21 U/L (ref 10–47)
AST: 24 U/L (ref 11–38)
Albumin: 3.3 g/dL (ref 3.3–5.5)
Alkaline Phosphatase: 52 U/L (ref 26–84)
BUN: 23 mg/dL — AB (ref 7–22)
CHLORIDE: 105 meq/L (ref 98–108)
CO2: 30 meq/L (ref 18–33)
Calcium: 9.7 mg/dL (ref 8.0–10.3)
Creat: 1.3 mg/dl — ABNORMAL HIGH (ref 0.6–1.2)
Glucose, Bld: 120 mg/dL — ABNORMAL HIGH (ref 73–118)
POTASSIUM: 4.1 meq/L (ref 3.3–4.7)
Sodium: 141 mEq/L (ref 128–145)
TOTAL PROTEIN: 6.7 g/dL (ref 6.4–8.1)
Total Bilirubin: 0.8 mg/dl (ref 0.20–1.60)

## 2016-12-04 LAB — URIC ACID: URIC ACID, SERUM: 9.2 mg/dL — AB (ref 2.6–7.4)

## 2016-12-04 NOTE — Progress Notes (Signed)
Hematology and Oncology Follow Up Visit  Jeremiah Walker [id number] 1927-06-30 83 y.o. 12/04/2016   Principle Diagnosis:    History of diffuse large cell non-Hodgkin's lymphoma-treated in [place]  Chronic Myelomonocytic Leukemia (CMMoL)  Current Therapy:    Observation     Interim History:  Jeremiah Walker is back for follow-up. We did do a bone marrow biopsy on him. This was done on May 31. The bone marrow report ([id number]) showed a hypercellular marrow with dyspoietic changes. It appeared to be consistent with chronic myelomonocytic leukemia.  I do not have the cytogenetics back.   His erythropoietin level is only 40.  He feels much better. I think a lot of this was felt that he was on a blood pressure medicine and a statin medicine. He now is off these.  He's had no problems with fever. He's had no bleeding. He's had no rashes. He has had no change in bowel or bladder habits.  He is planning to go up to [place] to their cottage in early July. He will be up there for about a week.  He had his 83th birthday yesterday. He actually will celebrated this upcoming weekend.  He has had no cough. His no shortness of breath.  Overall, his performance status is ECOG 1.  Medications:  Current Outpatient Prescriptions:  .  ALPRAZolam (XANAX) 0.5 MG tablet, Take 1 tablet (0.5 mg total) by mouth 3 (three) times daily as needed for anxiety., Disp: 90 tablet, Rfl: 2 .  amoxicillin (AMOXIL) 500 MG capsule, Take 4 capsules (2,000 mg total) by mouth as directed. ONE HOUR PRIOR TO DENTAL PROCEDURES, Disp: 4 capsule, Rfl: 1 .  bethanechol (URECHOLINE) 25 MG tablet, TAKE 1 TABLET BY MOUTH THREE TIMES DAILY, Disp: 90 tablet, Rfl: 0 .  Calcium 500 MG tablet, Take 600 mg by mouth daily., Disp: , Rfl:  .  Cholecalciferol (VITAMIN D3) 2000 UNITS capsule, Take 2,000 Units by mouth daily., Disp: , Rfl:  .  diazepam (VALIUM) 5 MG tablet, Take 1 tablet (5 mg total) by mouth every 12 (twelve) hours as  needed for muscle spasms. Take instead of ALprazolam, Disp: 10 tablet, Rfl: 1 .  fluorouracil (EFUDEX) 5 % cream, , Disp: , Rfl:  .  gabapentin (NEURONTIN) 300 MG capsule, TAKE 3 CAPSULES BY MOUTH THREE TIMES DAILY, PLUS 2 CAPSULES EVERY DAY AS NEEDED, Disp: 990 capsule, Rfl: 0 .  HYDROcodone-acetaminophen (NORCO) 7.5-325 MG tablet, Take 1 tablet by mouth every 6 (six) hours as needed for moderate pain. Ok to fill in 30 days, Disp: 100 tablet, Rfl: 0 .  magnesium 30 MG tablet, Take 30 mg by mouth 2 (two) times daily as needed., Disp: , Rfl:  .  Multiple Vitamin (MULTIVITAMIN) tablet, Take 1 tablet by mouth daily., Disp: , Rfl:  .  niacin 500 MG tablet, Take 500 mg by mouth at bedtime., Disp: , Rfl:  .  Omega-3 Fatty Acids (OMEGA-3 FISH OIL PO), Take 2 each by mouth every morning. EPA/DHA 520/350, Disp: , Rfl:  .  polyethylene glycol (MIRALAX / GLYCOLAX) packet, Take 17 g by mouth daily as needed., Disp: , Rfl:  .  predniSONE (DELTASONE) 20 MG tablet, Take 2 tablets (40 mg total) by mouth daily. Use for 3 days when needed for gout flare, Disp: 12 tablet, Rfl: 0 .  psyllium (METAMUCIL) 58.6 % powder, Take 1 packet by mouth daily. 1 Teaspoon daily, Disp: , Rfl:  .  S-Adenosylmethionine (SAM-E) 400 MG TABS, Take 400 mg by mouth  daily., Disp: , Rfl:  .  vitamin C (ASCORBIC ACID) 500 MG tablet, Take 500 mg by mouth daily., Disp: , Rfl:  .  zinc gluconate 50 MG tablet, Take 50 mg by mouth daily., Disp: , Rfl:   Current Facility-Administered Medications:  .  bupivacaine (MARCAINE) 0.5 % (with pres) injection 50 mL, 50 mL, Other, Once, Magnus Sinning, MD .  lidocaine (PF) (XYLOCAINE) 1 % injection 0.3 mL, 0.3 mL, Other, Once, Magnus Sinning, MD  Allergies:  Allergies  Allergen Reactions  . Nsaids Other (See Comments) and Tinitus    Bruising   . Ciprofloxacin     Other reaction(s): Other (See Comments) Achilles tendon  . Statins     Other reaction(s): Kidney issues Muscle pain  . Requip  [Ropinirole Hcl] Anxiety  . Ropinirole Anxiety    Mood swing    Past Medical History, Surgical history, Social history, and Family History were reviewed and updated.  Review of Systems: As above  Physical Exam:  weight is 219 lb (99.3 kg). His oral temperature is 97.8 F (36.6 C). His blood pressure is 124/66 and his pulse is 85. His respiration is 16 and oxygen saturation is 94%.   Wt Readings from Last 3 Encounters:  12/04/16 219 lb (99.3 kg)  12/03/16 218 lb 12.8 oz (99.2 kg)  11/30/16 215 lb (97.5 kg)     Head and neck exam shows no ocular or oral lesions. He has no palpable cervical or supraclavicular lymph nodes. Lungs are clear. Cardiac exam regular rate and rhythm with no murmurs, rubs or bruits. Abdomen is soft. He has good bowel sounds. There is no fluid wave. There is no palpable liver or spleen tip. Axillary exam shows no bilateral axillary adenopathy. Back exam shows no tenderness over the spine, ribs or hips. Extremities shows no clubbing, cyanosis or edema. He has good range of motion of his joints. He does have some osteoarthritic changes in his joints. Skin exam shows no rashes, ecchymoses or petechia. Neurological exam shows no focal neurological deficits.  Lab Results  Component Value Date   WBC 5.0 12/04/2016   HGB 11.3 (L) 12/04/2016   HCT 34.2 (L) 12/04/2016   MCV 106 (H) 12/04/2016   PLT 158 12/04/2016     Chemistry      Component Value Date/Time   NA 141 12/04/2016 0842   NA 141 05/15/2016 1126   K 4.1 12/04/2016 0842   K 4.3 05/15/2016 1126   CL 105 12/04/2016 0842   CO2 30 12/04/2016 0842   CO2 26 05/15/2016 1126   BUN 23 (H) 12/04/2016 0842   BUN 29.1 (H) 05/15/2016 1126   CREATININE 1.3 (H) 12/04/2016 0842   CREATININE 1.7 (H) 05/15/2016 1126   GLU 90 11/15/2014      Component Value Date/Time   CALCIUM 9.7 12/04/2016 0842   CALCIUM 9.7 05/15/2016 1126   ALKPHOS 52 12/04/2016 0842   ALKPHOS 69 05/15/2016 1126   AST 24 12/04/2016 0842    AST 23 05/15/2016 1126   ALT 21 12/04/2016 0842   ALT 22 05/15/2016 1126   BILITOT 0.80 12/04/2016 0842   BILITOT 0.84 05/15/2016 1126         Impression and Plan: Jeremiah Walker is a 83 year old white male. He had a history of diffuse large cell non-Hodgkin's lymphoma. He was treated  with 6 cycles of R-CHOP. He got this treatment up in [place]. He completed this back in April 2014.   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  This concerned could be a residual from his chemotherapy for his lymphoma. However, I would think that this is a little bit early to show up if it was from his chemotherapy.  His blood counts are much better. I'm not sure why his platelet count was depressed with his last visit.  There is no need for [place] to intervene right now. He is a symptomatic.  I will send off next generation sequencing of his peripheral blood The next time he is here.  I'm just so grateful that we do not have to intervene. Treatment for chronic myelomonocytic leukemia typically is not all that effective. It's more about quality of life and trying to minimize complications from altered blood counts.  I will like to see him back in 2 months. Again I don't see that he will have any problems when he goes up to [place].   I spent  40 minutes with him today.     [name], MD 6/12/20189:06 AM

## 2016-12-07 ENCOUNTER — Encounter: Payer: Self-pay | Admitting: Family Medicine

## 2016-12-07 DIAGNOSIS — M1000 Idiopathic gout, unspecified site: Secondary | ICD-10-CM

## 2016-12-10 MED ORDER — ALLOPURINOL 100 MG PO TABS: 100.0000 mg | ORAL_TABLET | Freq: Every day | ORAL | 6 refills | Status: DC

## 2016-12-13 ENCOUNTER — Encounter: Payer: Self-pay | Admitting: Hematology & Oncology

## 2016-12-19 ENCOUNTER — Encounter (HOSPITAL_COMMUNITY): Payer: Self-pay

## 2016-12-19 ENCOUNTER — Encounter: Payer: Self-pay | Admitting: Family Medicine

## 2016-12-19 MED ORDER — AMOXICILLIN 500 MG PO CAPS: 2000.0000 mg | ORAL_CAPSULE | ORAL | 1 refills | Status: DC

## 2016-12-27 ENCOUNTER — Other Ambulatory Visit: Payer: Self-pay | Admitting: Family Medicine

## 2016-12-28 LAB — CHROMOSOME ANALYSIS, BONE MARROW

## 2016-12-28 LAB — TISSUE HYBRIDIZATION (BONE MARROW)-NCBH

## 2017-01-04 ENCOUNTER — Telehealth: Payer: Self-pay | Admitting: Family Medicine

## 2017-01-04 DIAGNOSIS — G894 Chronic pain syndrome: Secondary | ICD-10-CM

## 2017-01-07 ENCOUNTER — Encounter: Payer: Self-pay | Admitting: Family Medicine

## 2017-01-07 MED ORDER — HYDROCODONE-ACETAMINOPHEN 7.5-325 MG PO TABS: 1.0000 | ORAL_TABLET | Freq: Four times a day (QID) | ORAL | 0 refills | Status: DC | PRN

## 2017-01-07 NOTE — Telephone Encounter (Signed)
Pt is requesting refill on hydrocodone 7.5-325mg .  Last OV: 12/03/2016 Last Fill: 10/03/2016 #100 and 0RF UDS: 08/13/2016 Low risk  Please advise.

## 2017-01-07 NOTE — Telephone Encounter (Signed)
[facility]: filled hydrocodone 100 pills 5/25, 4/18, 3/27, 2/20 Indication for chronic opioid: back pain Medication and dose: hydrocodone 7.5 # pills per month: 100 Last UDS date: 2/18 Pain contract signed (Y/N): 2/18 Date narcotic database last reviewed (include red flags): today  Ok to refill Last seen here on 12/03/16  Called pt and let him know

## 2017-01-10 MED FILL — HYDROCODON-APAP 7.5-325: 7.5-325 | 25 days supply | Qty: 100 | Fill #0

## 2017-01-24 ENCOUNTER — Other Ambulatory Visit: Payer: Self-pay | Admitting: Family Medicine

## 2017-01-28 ENCOUNTER — Encounter: Payer: Self-pay | Admitting: Family Medicine

## 2017-01-29 ENCOUNTER — Other Ambulatory Visit: Payer: Self-pay | Admitting: Emergency Medicine

## 2017-01-29 ENCOUNTER — Encounter: Payer: Self-pay | Admitting: Family Medicine

## 2017-01-29 DIAGNOSIS — G4700 Insomnia, unspecified: Secondary | ICD-10-CM

## 2017-01-29 DIAGNOSIS — G2581 Restless legs syndrome: Secondary | ICD-10-CM

## 2017-01-29 MED ORDER — ALPRAZOLAM 0.5 MG PO TABS: 0.5000 mg | ORAL_TABLET | Freq: Three times a day (TID) | ORAL | 2 refills | Status: DC | PRN

## 2017-01-29 NOTE — Telephone Encounter (Signed)
Dr. Lorelei Pont,    My Alprazolam 0.5mg  Tablets (90) are not on the Rx Refill so I had to use this note to request a refill. Still have a few left, but will need more in a week or so.    Regards,    Hal Cappelletti      Requesting: ALPRAZolam (XANAX) 0.5 MG tablet  08/13/16 uds sample given, low risk next screen 02/10/17.  Last OV: 12/03/2016 Last Refill: 10/03/2016  Please Advise

## 2017-01-29 NOTE — Telephone Encounter (Signed)
[facility]: he last filled a 30 day supply of xanax on 7/2- ok to refill, will call in for him

## 2017-01-30 DIAGNOSIS — H40013 Open angle with borderline findings, low risk, bilateral: Secondary | ICD-10-CM | POA: Diagnosis not present

## 2017-02-04 ENCOUNTER — Other Ambulatory Visit: Payer: Medicare Other

## 2017-02-04 ENCOUNTER — Ambulatory Visit: Payer: Medicare Other | Admitting: Hematology & Oncology

## 2017-02-05 NOTE — Progress Notes (Signed)
[facility] [address] [place], [place] [address] [phone]  Date:  02/06/2017   Name:  Jeremiah Walker   DOB:  06-27-1927   MRN:  [id number]  PCP:  [name], MD    Chief Complaint: Follow-up   History of Present Illness:  Jeremiah Walker is a 83 y.o. very pleasant male patient who presents with the following:  History of CML, HTN, CKD, hyperlipidemia Here today for a followup viist He sees Dr. Marin Olp for his CML I last saw him in June- we started him on allopurinol for elevated uric acid and gout sx that typically occur in his right foot or ankle  He is taking this every day and does think that it is helping.  He does not need his prednisone as often with the allopurinol.   He does not notice any SE from the allopurinol  He does eat shrimp on occasion but does not eat a lot of shellfish  He had his right hip replaced about 18 years ago. He notes that his right foot will turn out some when he walks but it is not painful at all. He would not be interested in any surgical treatment for this so declines films today   He has done some traveling over the summer- he went to [place] and [place].    Patient Active Problem List   Diagnosis Date Noted  . 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 Chronic pain syndrome 09/26/2015  . De Quervain's tenosynovitis, right 09/13/2015  . Anemia due to antineoplastic chemotherapy 08/15/2015  . DLBCL (diffuse large B cell lymphoma) ([place]) 08/15/2015  . Skin lesion of face 05/21/2015  . Peripheral neuropathy due to chemotherapy ([place]) 02/09/2015  . Benign prostatic hyperplasia with urinary obstruction 02/09/2015  . Bilateral hearing loss 02/09/2015  . Low back pain 02/09/2015  . Chronic kidney disease (CKD), stage III (moderate) 02/09/2015  . Essential (primary) hypertension 02/09/2015  . Gastro-esophageal reflux disease  without esophagitis 02/09/2015  . Anxiety, generalized 02/09/2015  . Cannot sleep 02/09/2015  . Combined fat and carbohydrate induced hyperlipemia 02/09/2015    Past Medical History:  Diagnosis Date  . Anemia   . Anemia due to antineoplastic chemotherapy 08/15/2015  . Anxiety   . Arthritis   . Cancer ([place])   . Cataract   . Chronic kidney disease (CKD), stage III (moderate) 02/09/2015   Controlled  . Combined fat and carbohydrate induced hyperlipemia 02/09/2015   Controlled  . Depression   . DLBCL (diffuse large B cell lymphoma) ([place]) 08/15/2015  . Essential (primary) hypertension 02/09/2015  . Gastro-esophageal reflux disease without esophagitis 02/09/2015   Controlled  . Neuromuscular disorder ([place])   . Tinnitus     Past Surgical History:  Procedure Laterality Date  . CATARACT EXTRACTION, BILATERAL    . COLONOSCOPY  May 2011  . CYSTOSCOPY    . TOTAL HIP ARTHROPLASTY  2003   Right   . TOTAL HIP ARTHROPLASTY  April, 2011   Left  . TRIGGER FINGER RELEASE  Nov 2013  . TURBINECTOMY  2006  . UPPER GI ENDOSCOPY  Nov 2015    Social History  Substance Use Topics  . Smoking status: Former Research scientist (life sciences)  . Smokeless tobacco: Never Used     Comment: Quit >50 years ago  . Alcohol use 0.6 - 2.4 oz/week    1 - 4 Shots of liquor per week  Family History  Problem Relation Age of Onset  . Anuerysm Mother 62       Deceased  . Colon cancer Father 32       Deceased  . Colon cancer Brother   . Prostate cancer Brother        Deceased  . Alzheimer's disease Brother   . Heart disease Brother   . Alzheimer's disease Paternal Grandmother   . Early death Maternal Grandmother        Unknown  . Obesity Son   . Obesity Son        Gastric Bypass  . Healthy Son   . Healthy Daughter   . Diabetes Neg Hx     Allergies  Allergen Reactions  . Nsaids Other (See Comments) and Tinitus    Bruising   . Ciprofloxacin     Other reaction(s): Other (See Comments) Achilles tendon  . Statins      Other reaction(s): Kidney issues Muscle pain  . Requip [Ropinirole Hcl] Anxiety  . Ropinirole Anxiety    Mood swing    Medication list has been reviewed and updated.  Current Outpatient Prescriptions on File Prior to Visit  Medication Sig Dispense Refill  . allopurinol (ZYLOPRIM) 100 MG tablet Take 1 tablet (100 mg total) by mouth daily. 30 tablet 6  . ALPRAZolam (XANAX) 0.5 MG tablet Take 1 tablet (0.5 mg total) by mouth 3 (three) times daily as needed for anxiety. 90 tablet 2  . bethanechol (URECHOLINE) 25 MG tablet TAKE 1 TABLET BY MOUTH THREE TIMES DAILY 90 tablet 0  . Calcium 500 MG tablet Take 600 mg by mouth daily.    . Cholecalciferol (VITAMIN D3) 2000 UNITS capsule Take 2,000 Units by mouth daily.    . fluorouracil (EFUDEX) 5 % cream     . gabapentin (NEURONTIN) 300 MG capsule TAKE 3 CAPSULES BY MOUTH THREE TIMES DAILY, PLUS 2 CAPSULES EVERY DAY AS NEEDED 990 capsule 0  . HYDROcodone-acetaminophen (NORCO) 7.5-325 MG tablet Take 1 tablet by mouth every 6 (six) hours as needed for moderate pain. 100 tablet 0  . Multiple Vitamin (MULTIVITAMIN) tablet Take 1 tablet by mouth daily.    . niacin 500 MG tablet Take 500 mg by mouth at bedtime.    . Omega-3 Fatty Acids (OMEGA-3 FISH OIL PO) Take 2 each by mouth every morning. EPA/DHA 520/350    . polyethylene glycol (MIRALAX / GLYCOLAX) packet Take 17 g by mouth daily as needed.    . psyllium (METAMUCIL) 58.6 % powder Take 1 packet by mouth daily. 1 Teaspoon daily    . S-Adenosylmethionine (SAM-E) 400 MG TABS Take 400 mg by mouth daily.    . vitamin C (ASCORBIC ACID) 500 MG tablet Take 500 mg by mouth daily.    Marland Kitchen zinc gluconate 50 MG tablet Take 50 mg by mouth daily.    Marland Kitchen amoxicillin (AMOXIL) 500 MG capsule Take 4 capsules (2,000 mg total) by mouth as directed. ONE HOUR PRIOR TO DENTAL PROCEDURES (Patient not taking: Reported on 02/06/2017) 8 capsule 1   Current Facility-Administered Medications on File Prior to Visit  Medication Dose  Route Frequency Provider Last Rate Last Dose  . bupivacaine (MARCAINE) 0.5 % (with pres) injection 50 mL  50 mL Other Once Magnus Sinning, MD      . lidocaine (PF) (XYLOCAINE) 1 % injection 0.3 mL  0.3 mL Other Once Magnus Sinning, MD        Review of Systems:  As per HPI- otherwise negative.  Physical Examination: Vitals:   02/06/17 1249  BP: 138/62  Pulse: 68  Temp: 97.8 F (36.6 C)  SpO2: 96%   Vitals:   02/06/17 1249  Weight: 219 lb 6.4 oz (99.5 kg)  Height: 5' 8.5" (1.74 m)   Body mass index is 32.87 kg/m. Ideal Body Weight: Weight in (lb) to have BMI = 25: 166.5  GEN: WDWN, NAD, Non-toxic, A & O x 3 HEENT: Atraumatic, Normocephalic. Neck supple. No masses, No LAD. Ears and Nose: No external deformity. CV: RRR, No M/G/R. No JVD. No thrill. No extra heart sounds. PULM: CTA B, no wheezes, crackles, rhonchi. No retractions. No resp. distress. No accessory muscle use. ABD: S, NT, ND, +BS. No rebound. No HSM. EXTR: No c/c/e NEURO Normal gait.  PSYCH: Normally interactive. Conversant. Not depressed or anxious appearing.  Calm demeanor.  He does use a cane to walk   Assessment and Plan: Acute idiopathic gout, unspecified site - Plan: Uric acid  Repeat uric acid today- allopurinol does seem to be helping him, he will adjust his dose if needed based on uric acid level He is seeing Dr. Marin Olp today as well and will have other labs with him Otherwise Camillo is doing great, notes that his exercise tolerance is very good   Signed Lamar Blinks, MD

## 2017-02-06 ENCOUNTER — Other Ambulatory Visit (HOSPITAL_BASED_OUTPATIENT_CLINIC_OR_DEPARTMENT_OTHER): Payer: Medicare Other

## 2017-02-06 ENCOUNTER — Ambulatory Visit (HOSPITAL_BASED_OUTPATIENT_CLINIC_OR_DEPARTMENT_OTHER): Payer: Medicare Other | Admitting: Hematology & Oncology

## 2017-02-06 ENCOUNTER — Telehealth: Payer: Self-pay | Admitting: Family Medicine

## 2017-02-06 ENCOUNTER — Encounter: Payer: Self-pay | Admitting: Family Medicine

## 2017-02-06 ENCOUNTER — Ambulatory Visit (INDEPENDENT_AMBULATORY_CARE_PROVIDER_SITE_OTHER): Payer: Medicare Other | Admitting: Family Medicine

## 2017-02-06 VITALS — BP 138/62 | HR 68 | Temp 97.8°F | Ht 68.5 in | Wt 219.4 lb

## 2017-02-06 VITALS — BP 136/64 | HR 64 | Temp 98.1°F | Resp 20 | Wt 219.0 lb

## 2017-02-06 DIAGNOSIS — C9310 Chronic myelomonocytic leukemia not having achieved remission: Secondary | ICD-10-CM | POA: Diagnosis not present

## 2017-02-06 DIAGNOSIS — M1000 Idiopathic gout, unspecified site: Secondary | ICD-10-CM

## 2017-02-06 DIAGNOSIS — G620 Drug-induced polyneuropathy: Secondary | ICD-10-CM | POA: Diagnosis not present

## 2017-02-06 DIAGNOSIS — C8330 Diffuse large B-cell lymphoma, unspecified site: Secondary | ICD-10-CM | POA: Diagnosis not present

## 2017-02-06 DIAGNOSIS — D6481 Anemia due to antineoplastic chemotherapy: Secondary | ICD-10-CM | POA: Diagnosis not present

## 2017-02-06 DIAGNOSIS — Z8572 Personal history of non-Hodgkin lymphomas: Secondary | ICD-10-CM | POA: Diagnosis not present

## 2017-02-06 DIAGNOSIS — L989 Disorder of the skin and subcutaneous tissue, unspecified: Secondary | ICD-10-CM | POA: Diagnosis not present

## 2017-02-06 DIAGNOSIS — N401 Enlarged prostate with lower urinary tract symptoms: Secondary | ICD-10-CM | POA: Diagnosis not present

## 2017-02-06 LAB — CBC WITH DIFFERENTIAL (CANCER CENTER ONLY)
BASO#: 0.1 10*3/uL (ref 0.0–0.2)
BASO%: 2.1 % — AB (ref 0.0–2.0)
EOS%: 1.2 % (ref 0.0–7.0)
Eosinophils Absolute: 0.1 10*3/uL (ref 0.0–0.5)
HCT: 33.8 % — ABNORMAL LOW (ref 38.7–49.9)
HEMOGLOBIN: 11.3 g/dL — AB (ref 13.0–17.1)
LYMPH#: 1.5 10*3/uL (ref 0.9–3.3)
LYMPH%: 31 % (ref 14.0–48.0)
MCH: 35.1 pg — ABNORMAL HIGH (ref 28.0–33.4)
MCHC: 33.4 g/dL (ref 32.0–35.9)
MCV: 105 fL — AB (ref 82–98)
MONO#: 1.8 10*3/uL — AB (ref 0.1–0.9)
MONO%: 38 % — ABNORMAL HIGH (ref 0.0–13.0)
NEUT%: 27.7 % — ABNORMAL LOW (ref 40.0–80.0)
NEUTROS ABS: 1.3 10*3/uL — AB (ref 1.5–6.5)
PLATELETS: 134 10*3/uL — AB (ref 145–400)
RBC: 3.22 10*6/uL — AB (ref 4.20–5.70)
RDW: 13.6 % (ref 11.1–15.7)
WBC: 4.8 10*3/uL (ref 4.0–10.0)

## 2017-02-06 LAB — CMP (CANCER CENTER ONLY)
ALK PHOS: 65 U/L (ref 26–84)
ALT: 13 U/L (ref 10–47)
AST: 24 U/L (ref 11–38)
Albumin: 3.6 g/dL (ref 3.3–5.5)
BUN: 17 mg/dL (ref 7–22)
CO2: 29 mEq/L (ref 18–33)
Calcium: 9.6 mg/dL (ref 8.0–10.3)
Chloride: 106 mEq/L (ref 98–108)
Creat: 1.2 mg/dl (ref 0.6–1.2)
Glucose, Bld: 109 mg/dL (ref 73–118)
POTASSIUM: 4 meq/L (ref 3.3–4.7)
SODIUM: 143 meq/L (ref 128–145)
TOTAL PROTEIN: 7.6 g/dL (ref 6.4–8.1)
Total Bilirubin: 1 mg/dl (ref 0.20–1.60)

## 2017-02-06 LAB — CHCC SATELLITE - SMEAR

## 2017-02-06 LAB — LACTATE DEHYDROGENASE: LDH: 185 U/L (ref 125–245)
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
## 2017-02-06 NOTE — Patient Instructions (Signed)
It was good to see you again today- I am glad that the allopurinol seems to be helping I will check your uric acid level to day and we can increase the allopurinol dose if needed   Take care!

## 2017-02-07 DIAGNOSIS — D696 Thrombocytopenia, unspecified: Secondary | ICD-10-CM | POA: Diagnosis not present

## 2017-02-07 DIAGNOSIS — N183 Chronic kidney disease, stage 3 (moderate): Secondary | ICD-10-CM | POA: Diagnosis not present

## 2017-02-07 DIAGNOSIS — Z6833 Body mass index (BMI) 33.0-33.9, adult: Secondary | ICD-10-CM | POA: Diagnosis not present

## 2017-02-07 DIAGNOSIS — M1990 Unspecified osteoarthritis, unspecified site: Secondary | ICD-10-CM | POA: Diagnosis not present

## 2017-02-07 DIAGNOSIS — E785 Hyperlipidemia, unspecified: Secondary | ICD-10-CM | POA: Diagnosis not present

## 2017-02-07 DIAGNOSIS — I129 Hypertensive chronic kidney disease with stage 1 through stage 4 chronic kidney disease, or unspecified chronic kidney disease: Secondary | ICD-10-CM | POA: Diagnosis not present

## 2017-02-07 DIAGNOSIS — C9310 Chronic myelomonocytic leukemia not having achieved remission: Secondary | ICD-10-CM | POA: Diagnosis not present

## 2017-02-07 DIAGNOSIS — C8330 Diffuse large B-cell lymphoma, unspecified site: Secondary | ICD-10-CM | POA: Diagnosis not present

## 2017-02-07 DIAGNOSIS — N2581 Secondary hyperparathyroidism of renal origin: Secondary | ICD-10-CM | POA: Diagnosis not present

## 2017-02-07 DIAGNOSIS — D631 Anemia in chronic kidney disease: Secondary | ICD-10-CM | POA: Diagnosis not present

## 2017-02-07 DIAGNOSIS — G629 Polyneuropathy, unspecified: Secondary | ICD-10-CM | POA: Diagnosis not present

## 2017-02-07 LAB — RETICULOCYTES: Reticulocyte Count: 1.4 % (ref 0.6–2.6)

## 2017-02-07 LAB — URIC ACID: URIC ACID, SERUM: 7 mg/dL (ref 4.0–7.8)

## 2017-02-08 ENCOUNTER — Encounter: Payer: Self-pay | Admitting: Family Medicine

## 2017-02-08 DIAGNOSIS — M1000 Idiopathic gout, unspecified site: Secondary | ICD-10-CM

## 2017-02-09 MED ORDER — ALLOPURINOL 100 MG PO TABS: 200.0000 mg | ORAL_TABLET | Freq: Every day | ORAL | 3 refills | Status: DC

## 2017-02-11 ENCOUNTER — Other Ambulatory Visit: Payer: Self-pay | Admitting: Family Medicine

## 2017-02-12 ENCOUNTER — Telehealth: Payer: Self-pay | Admitting: Family Medicine

## 2017-02-12 DIAGNOSIS — G894 Chronic pain syndrome: Secondary | ICD-10-CM

## 2017-02-12 NOTE — Telephone Encounter (Signed)
Error/gd °

## 2017-02-13 MED FILL — HYDROCODON-APAP 7.5-325: 7.5-325 | 25 days supply | Qty: 100 | Fill #0

## 2017-02-13 NOTE — Telephone Encounter (Signed)
Called pharmacy to make sure that they have norco to fill- they will get his rx ready

## 2017-02-13 NOTE — Telephone Encounter (Signed)
Unsure if Pt needing refill of Hydrocodone. See Pt comment below:  Patient Comment: The pharmacy has the script. I'll stop by Wed. and hopefully get the Norco.

## 2017-02-21 ENCOUNTER — Other Ambulatory Visit: Payer: Self-pay | Admitting: Family Medicine

## 2017-02-22 LAB — MYELODYSPLASTIC SYNDROME (MDS) PANEL

## 2017-03-14 ENCOUNTER — Encounter: Payer: Self-pay | Admitting: Family Medicine

## 2017-03-14 MED ORDER — PREDNISONE 20 MG PO TABS: ORAL_TABLET | ORAL | 0 refills | Status: DC

## 2017-03-20 ENCOUNTER — Encounter: Payer: Self-pay | Admitting: Family Medicine

## 2017-03-20 ENCOUNTER — Telehealth: Payer: Self-pay | Admitting: Family Medicine

## 2017-03-20 DIAGNOSIS — G894 Chronic pain syndrome: Secondary | ICD-10-CM

## 2017-03-20 MED ORDER — HYDROCODONE-ACETAMINOPHEN 7.5-325 MG PO TABS: 1.0000 | ORAL_TABLET | Freq: Four times a day (QID) | ORAL | 0 refills | Status: DC | PRN

## 2017-03-20 NOTE — Telephone Encounter (Signed)
[facility]; cannot acces at this time due to website change Will refill for this low risk patient

## 2017-03-20 NOTE — Telephone Encounter (Signed)
Pt is requesting refill on Hydrocodone 7.5-325mg .   Last OV: 02/06/2017 Last Fill: 01/07/2017 #100 and 0RF UDS: 08/13/2016 Low risk   Please advise.

## 2017-03-27 NOTE — Progress Notes (Signed)
[facility] [place], [place], [place] [address] [phone]  Date:  03/28/2017   Name:  Jeremiah Walker   DOB:  [birth year]   MRN:  [id number]  PCP:  [name], MD    Chief Complaint: Nasal Congestion ( either cold or allergy sx's. )   History of Present Illness:  Jeremiah Walker is a 83 y.o. very pleasant male patient who presents with the following:  History of CML, HTN, CKD Here today with concern of illness- however he is actually feeling better over the last couple of days About a month ago he developed cold type symptoms, PND, some cough, rare sneezing He will have a runny nose off an on, and sometimes nasal congestion His eyes will be a bit itchy and he does use drops for them No fever or chills The cough is generally dry- only occasionally productive No GI symptoms No rash  He does do a saline nasal irrigation - he has not tried any other meds He used to use some sudafed but it is now "behing the counter" at the pharmacy  He will get his flu shot at [facility] this fall  Patient Active Problem List   Diagnosis Date Noted  . 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 Chronic pain syndrome 09/26/2015  . De Quervain's tenosynovitis, right 09/13/2015  . Anemia due to antineoplastic chemotherapy 08/15/2015  . DLBCL (diffuse large B cell lymphoma) ([place]) 08/15/2015  . Skin lesion of face 05/21/2015  . Peripheral neuropathy due to chemotherapy ([place]) 02/09/2015  . Benign prostatic hyperplasia with urinary obstruction 02/09/2015  . Bilateral hearing loss 02/09/2015  . Low back pain 02/09/2015  . Chronic kidney disease (CKD), stage III (moderate) ([place]) 02/09/2015  . Essential (primary) hypertension 02/09/2015  . Gastro-esophageal reflux disease without esophagitis 02/09/2015  . Anxiety, generalized 02/09/2015  . Cannot sleep 02/09/2015  . Combined fat and  carbohydrate induced hyperlipemia 02/09/2015    Past Medical History:  Diagnosis Date  . Anemia   . Anemia due to antineoplastic chemotherapy 08/15/2015  . Anxiety   . Arthritis   . Cancer ([place])   . Cataract   . Chronic kidney disease (CKD), stage III (moderate) 02/09/2015   Controlled  . Combined fat and carbohydrate induced hyperlipemia 02/09/2015   Controlled  . Depression   . DLBCL (diffuse large B cell lymphoma) ([place]) 08/15/2015  . Essential (primary) hypertension 02/09/2015  . Gastro-esophageal reflux disease without esophagitis 02/09/2015   Controlled  . Neuromuscular disorder ([place])   . Tinnitus     Past Surgical History:  Procedure Laterality Date  . CATARACT EXTRACTION, BILATERAL    . COLONOSCOPY  May 2011  . CYSTOSCOPY    . TOTAL HIP ARTHROPLASTY  2003   Right   . TOTAL HIP ARTHROPLASTY  April, 2011   Left  . TRIGGER FINGER RELEASE  Nov 2013  . TURBINECTOMY  2006  . UPPER GI ENDOSCOPY  Nov 2015    Social History  Substance Use Topics  . Smoking status: Former Research scientist (life sciences)  . Smokeless tobacco: Never Used     Comment: Quit >50 years ago  . Alcohol use 0.6 - 2.4 oz/week    1 - 4 Shots of liquor per week    Family History  Problem Relation Age of Onset  . Anuerysm Mother 53       Deceased  . Colon cancer Father 53  Deceased  . Colon cancer Brother   . Prostate cancer Brother        Deceased  . Alzheimer's disease Brother   . Heart disease Brother   . Alzheimer's disease Paternal Grandmother   . Early death Maternal Grandmother        Unknown  . Obesity Son   . Obesity Son        Gastric Bypass  . Healthy Son   . Healthy Daughter   . Diabetes Neg Hx     Allergies  Allergen Reactions  . Nsaids Other (See Comments) and Tinitus    Bruising   . Ciprofloxacin     Other reaction(s): Other (See Comments) Achilles tendon  . Statins     Other reaction(s): Kidney issues Muscle pain  . Requip [Ropinirole Hcl] Anxiety  . Ropinirole Anxiety    Mood  swing    Medication list has been reviewed and updated.  Current Outpatient Prescriptions on File Prior to Visit  Medication Sig Dispense Refill  . allopurinol (ZYLOPRIM) 100 MG tablet Take 2 tablets (200 mg total) by mouth daily. 180 tablet 3  . ALPRAZolam (XANAX) 0.5 MG tablet Take 1 tablet (0.5 mg total) by mouth 3 (three) times daily as needed for anxiety. 90 tablet 2  . amoxicillin (AMOXIL) 500 MG capsule Take 4 capsules (2,000 mg total) by mouth as directed. ONE HOUR PRIOR TO DENTAL PROCEDURES 8 capsule 1  . bethanechol (URECHOLINE) 25 MG tablet TAKE 1 TABLET BY MOUTH THREE TIMES DAILY 90 tablet 5  . Calcium 500 MG tablet Take 600 mg by mouth daily.    . Cholecalciferol (VITAMIN D3) 2000 UNITS capsule Take 2,000 Units by mouth daily.    . fluorouracil (EFUDEX) 5 % cream     . gabapentin (NEURONTIN) 300 MG capsule TAKE 3 CAPSULES BY MOUTH THREE TIMES DAILY, PLUS 2 CAPSULES EVERY DAY AS NEEDED 990 capsule 0  . HYDROcodone-acetaminophen (NORCO) 7.5-325 MG tablet Take 1 tablet by mouth every 6 (six) hours as needed for moderate pain. 100 tablet 0  . Multiple Vitamin (MULTIVITAMIN) tablet Take 1 tablet by mouth daily.    . niacin 500 MG tablet Take 500 mg by mouth at bedtime.    . Omega-3 Fatty Acids (OMEGA-3 FISH OIL PO) Take 2 each by mouth every morning. EPA/DHA 520/350    . polyethylene glycol (MIRALAX / GLYCOLAX) packet Take 17 g by mouth daily as needed.    . predniSONE (DELTASONE) 20 MG tablet Take 1-2 pills by mouth daily for 3 days.  May repeat as needed for gout flare 12 tablet 0  . psyllium (METAMUCIL) 58.6 % powder Take 1 packet by mouth daily. 1 Teaspoon daily    . S-Adenosylmethionine (SAM-E) 400 MG TABS Take 400 mg by mouth daily.    . vitamin C (ASCORBIC ACID) 500 MG tablet Take 500 mg by mouth daily.    Marland Kitchen zinc gluconate 50 MG tablet Take 50 mg by mouth daily.     Current Facility-Administered Medications on File Prior to Visit  Medication Dose Route Frequency Provider  Last Rate Last Dose  . bupivacaine (MARCAINE) 0.5 % (with pres) injection 50 mL  50 mL Other Once Magnus Sinning, MD      . lidocaine (PF) (XYLOCAINE) 1 % injection 0.3 mL  0.3 mL Other Once Magnus Sinning, MD        Review of Systems:  As per HPI- otherwise negative. No fever or chills  Physical Examination: Vitals:   03/28/17  1757  BP: 132/70  Pulse: 82  Temp: 98.2 F (36.8 C)  SpO2: 98%   Vitals:   03/28/17 1757  Weight: 216 lb 6.4 oz (98.2 kg)   Body mass index is 32.42 kg/m. Ideal Body Weight:    GEN: WDWN, NAD, Non-toxic, A & O x 3, looks well, central obesity HEENT: Atraumatic, Normocephalic. Neck supple. No masses, No LAD.  Bilateral TM wnl, oropharynx normal.  PEERL,EOMI.   Ears and Nose: No external deformity. CV: RRR, No M/G/R. No JVD. No thrill. No extra heart sounds. PULM: CTA B, no wheezes, crackles, rhonchi. No retractions. No resp. distress. No accessory muscle use. ABD: S, NT, ND. No rebound. No HSM. EXTR: No c/c/e NEURO Normal gait.  PSYCH: Normally interactive. Conversant. Not depressed or anxious appearing.  Calm demeanor.    Assessment and Plan: Seasonal allergic rhinitis, unspecified trigger - Plan: azelastine (ASTELIN) 0.1 % nasal spray  Here today with likely seasonal allergy sx Given samples of allegra and an rx for astelin He will let me know if not helpful He will get his flu shot later on this fall  Signed Lamar Blinks, MD

## 2017-03-28 ENCOUNTER — Ambulatory Visit (INDEPENDENT_AMBULATORY_CARE_PROVIDER_SITE_OTHER): Payer: Medicare Other | Admitting: Family Medicine

## 2017-03-28 VITALS — BP 132/70 | HR 82 | Temp 98.2°F | Wt 216.4 lb

## 2017-03-28 DIAGNOSIS — J302 Other seasonal allergic rhinitis: Secondary | ICD-10-CM

## 2017-03-28 MED ORDER — AZELASTINE HCL 0.1 % NA SOLN: 2.0000 | Freq: Two times a day (BID) | NASAL | 6 refills | Status: DC

## 2017-03-28 NOTE — Patient Instructions (Signed)
It was good to see you today- I am glad that your are feeling better.  I gave you some samples of Allegra 24 hour today- this is a non- sedating antihistamine.  I also gave you an rx for Astelin nasal spray which I think will help with your nasal symtpoms  Please let me know if you are not feeling better in the next few days- Sooner if worse.

## 2017-04-09 DIAGNOSIS — Z23 Encounter for immunization: Secondary | ICD-10-CM | POA: Diagnosis not present

## 2017-04-20 ENCOUNTER — Encounter: Payer: Self-pay | Admitting: Family Medicine

## 2017-04-22 MED ORDER — AMOXICILLIN 500 MG PO CAPS: 2000.0000 mg | ORAL_CAPSULE | ORAL | 1 refills | Status: DC

## 2017-04-29 ENCOUNTER — Encounter: Payer: Self-pay | Admitting: Family Medicine

## 2017-04-29 ENCOUNTER — Telehealth: Payer: Self-pay | Admitting: Family Medicine

## 2017-04-29 DIAGNOSIS — G894 Chronic pain syndrome: Secondary | ICD-10-CM

## 2017-04-29 MED ORDER — HYDROCODONE-ACETAMINOPHEN 7.5-325 MG PO TABS: 1.0000 | ORAL_TABLET | Freq: Four times a day (QID) | ORAL | 0 refills | Status: DC | PRN

## 2017-04-29 NOTE — Telephone Encounter (Signed)
[facility]: last filled hydrocodone on 10/4 No unexpected entries Last seen here a month ago UDS and contract are UTD Ok to refill Called pt to let him know rx can be picked up

## 2017-04-29 NOTE — Telephone Encounter (Signed)
Pt is requesting refill on Hydrocodone 

## 2017-04-30 MED FILL — HYDROCODON-APAP 7.5-325: 7.5-325 | 25 days supply | Qty: 100 | Fill #0

## 2017-05-05 ENCOUNTER — Encounter: Payer: Self-pay | Admitting: Family Medicine

## 2017-05-05 DIAGNOSIS — G2581 Restless legs syndrome: Secondary | ICD-10-CM

## 2017-05-05 DIAGNOSIS — G4700 Insomnia, unspecified: Secondary | ICD-10-CM

## 2017-05-06 MED ORDER — ALPRAZOLAM 0.5 MG PO TABS: 0.5000 mg | ORAL_TABLET | Freq: Three times a day (TID) | ORAL | 2 refills | Status: DC | PRN

## 2017-05-08 ENCOUNTER — Other Ambulatory Visit: Payer: Self-pay

## 2017-05-08 ENCOUNTER — Encounter: Payer: Self-pay | Admitting: Hematology & Oncology

## 2017-05-08 ENCOUNTER — Ambulatory Visit (HOSPITAL_BASED_OUTPATIENT_CLINIC_OR_DEPARTMENT_OTHER): Payer: Medicare Other | Admitting: Hematology & Oncology

## 2017-05-08 ENCOUNTER — Other Ambulatory Visit (HOSPITAL_BASED_OUTPATIENT_CLINIC_OR_DEPARTMENT_OTHER): Payer: Medicare Other

## 2017-05-08 VITALS — BP 106/56 | HR 79 | Temp 99.7°F | Resp 20 | Wt 221.0 lb

## 2017-05-08 DIAGNOSIS — Z8572 Personal history of non-Hodgkin lymphomas: Secondary | ICD-10-CM

## 2017-05-08 DIAGNOSIS — C9310 Chronic myelomonocytic leukemia not having achieved remission: Secondary | ICD-10-CM | POA: Diagnosis not present

## 2017-05-08 LAB — CMP (CANCER CENTER ONLY)
ALBUMIN: 3.3 g/dL (ref 3.3–5.5)
ALK PHOS: 64 U/L (ref 26–84)
ALT: 19 U/L (ref 10–47)
AST: 22 U/L (ref 11–38)
BUN, Bld: 15 mg/dL (ref 7–22)
CALCIUM: 9.1 mg/dL (ref 8.0–10.3)
CO2: 30 mEq/L (ref 18–33)
Chloride: 105 mEq/L (ref 98–108)
Creat: 1.2 mg/dl (ref 0.6–1.2)
GLUCOSE: 113 mg/dL (ref 73–118)
POTASSIUM: 4 meq/L (ref 3.3–4.7)
Sodium: 139 mEq/L (ref 128–145)
TOTAL PROTEIN: 6.9 g/dL (ref 6.4–8.1)
Total Bilirubin: 1 mg/dl (ref 0.20–1.60)

## 2017-05-08 LAB — CBC WITH DIFFERENTIAL (CANCER CENTER ONLY)
BASO#: 0.2 10*3/uL (ref 0.0–0.2)
BASO%: 2 % (ref 0.0–2.0)
EOS ABS: 0.1 10*3/uL (ref 0.0–0.5)
EOS%: 0.6 % (ref 0.0–7.0)
HEMATOCRIT: 31.4 % — AB (ref 38.7–49.9)
HEMOGLOBIN: 10.3 g/dL — AB (ref 13.0–17.1)
LYMPH#: 1.7 10*3/uL (ref 0.9–3.3)
LYMPH%: 17.9 % (ref 14.0–48.0)
MCH: 34.8 pg — AB (ref 28.0–33.4)
MCHC: 32.8 g/dL (ref 32.0–35.9)
MCV: 106 fL — AB (ref 82–98)
MONO#: 4.5 10*3/uL — AB (ref 0.1–0.9)
NEUT%: 31.4 % — ABNORMAL LOW (ref 40.0–80.0)
NEUTROS ABS: 3 10*3/uL (ref 1.5–6.5)
Platelets: 125 10*3/uL — ABNORMAL LOW (ref 145–400)
RBC: 2.96 10*6/uL — AB (ref 4.20–5.70)
RDW: 14.3 % (ref 11.1–15.7)
WBC: 9.4 10*3/uL (ref 4.0–10.0)

## 2017-05-08 LAB — LACTATE DEHYDROGENASE: LDH: 214 U/L (ref 125–245)

## 2017-05-08 LAB — CHCC SATELLITE - SMEAR

## 2017-05-08 NOTE — Progress Notes (Signed)
Hematology and Oncology Follow Up Visit  Jeremiah Walker [id number] 1928-04-28 83 y.o. 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 is back for follow-up.  He looks good.  He is doing okay.  I actually saw him m at the [facility] concert a couple weeks ago.  He and I talked a lot about that concert today.  He feels a little tired.  He has had no fever.  He is still trying to exercise.  He has had no rashes.  He has had no abdominal pain.  He has had no change in bowel or bladder habits.  He has had no leg swelling.  Is been no change in medications.  He will be at 1 of his son's home for Thanksgiving.  Overall, his performance status is ECOG 1.  Medications:  Current Outpatient Medications:  .  allopurinol (ZYLOPRIM) 100 MG tablet, Take 2 tablets (200 mg total) by mouth daily., Disp: 180 tablet, Rfl: 3 .  ALPRAZolam (XANAX) 0.5 MG tablet, Take 1 tablet (0.5 mg total) 3 (three) times daily as needed by mouth for anxiety., Disp: 90 tablet, Rfl: 2 .  amoxicillin (AMOXIL) 500 MG capsule, Take 4 capsules (2,000 mg total) by mouth as directed. ONE HOUR PRIOR TO DENTAL PROCEDURES, Disp: 8 capsule, Rfl: 1 .  azelastine (ASTELIN) 0.1 % nasal spray, Place 2 sprays into both nostrils 2 (two) times daily. Use in each nostril as directed, Disp: 30 mL, Rfl: 6 .  bethanechol (URECHOLINE) 25 MG tablet, TAKE 1 TABLET BY MOUTH THREE TIMES DAILY, Disp: 90 tablet, Rfl: 5 .  Calcium 500 MG tablet, Take 600 mg by mouth daily., Disp: , Rfl:  .  Cholecalciferol (VITAMIN D3) 2000 UNITS capsule, Take 2,000 Units by mouth daily., Disp: , Rfl:  .  fluorouracil (EFUDEX) 5 % cream, , Disp: , Rfl:  .  gabapentin (NEURONTIN) 300 MG capsule, TAKE 3 CAPSULES BY MOUTH THREE TIMES DAILY, PLUS 2 CAPSULES EVERY  DAY AS NEEDED, Disp: 990 capsule, Rfl: 0 .  HYDROcodone-acetaminophen (NORCO) 7.5-325 MG tablet, Take 1 tablet every 6 (six) hours as needed by mouth for moderate pain., Disp: 100 tablet, Rfl: 0 .  Multiple Vitamin (MULTIVITAMIN) tablet, Take 1 tablet by mouth daily., Disp: , Rfl:  .  niacin 500 MG tablet, Take 500 mg by mouth at bedtime., Disp: , Rfl:  .  Omega-3 Fatty Acids (OMEGA-3 FISH OIL PO), Take 2 each by mouth every morning. EPA/DHA 520/350, Disp: , Rfl:  .  polyethylene glycol (MIRALAX / GLYCOLAX) packet, Take 17 g by mouth daily as needed., Disp: , Rfl:  .  predniSONE (DELTASONE) 20 MG tablet, Take 1-2 pills by mouth daily for 3 days.  May repeat as needed for gout flare, Disp: 12 tablet, Rfl: 0 .  psyllium (METAMUCIL) 58.6 % powder, Take 1 packet by mouth daily. 1 Teaspoon daily, Disp: , Rfl:  .  S-Adenosylmethionine (SAM-E) 400 MG TABS, Take 400 mg by mouth daily., Disp: , Rfl:  .  vitamin C (ASCORBIC ACID) 500 MG tablet, Take 500 mg by mouth daily., Disp: , Rfl:  .  zinc gluconate 50 MG tablet, Take 50 mg by mouth daily., Disp: , Rfl:   Current Facility-Administered Medications:  .  bupivacaine (MARCAINE) 0.5 % (with pres) injection 50 mL, 50 mL, Other,  Once, Magnus Sinning, MD .  lidocaine (PF) (XYLOCAINE) 1 % injection 0.3 mL, 0.3 mL, Other, Once, Magnus Sinning, MD  Allergies:  Allergies  Allergen Reactions  . Nsaids Other (See Comments) and Tinitus    Bruising   . Tolmetin Other (See Comments)    Bruising  . Ciprofloxacin Other (See Comments)    Other reaction(s): Other (See Comments) Achilles tendon Achilles tendon  . Statins Nausea And Vomiting and Other (See Comments)    Other reaction(s): Kidney issues Muscle pain Muscle pain Other reaction(s): Kidney issues Muscle pain  . Requip [Ropinirole Hcl] Anxiety  . Ropinirole Anxiety    Mood swing    Past Medical History, Surgical history, Social history, and Family History were reviewed and updated.  Review  of Systems: As stated in the interim history  Physical Exam:  weight is 221 lb (100.2 kg). His oral temperature is 99.7 F (37.6 C). His blood pressure is 106/56 (abnormal) and his pulse is 79. His respiration is 20 and oxygen saturation is 96%.   Wt Readings from Last 3 Encounters:  05/08/17 221 lb (100.2 kg)  03/28/17 216 lb 6.4 oz (98.2 kg)  02/06/17 219 lb (99.3 kg)     I examined Jeremiah Walker.  The findings of my examination are noted below with appropriate changes:   Head and neck exam shows no ocular or oral lesions. He has no palpable cervical or supraclavicular lymph nodes. Lungs are clear. Cardiac exam regular rate and rhythm with no murmurs, rubs or bruits. Abdomen is soft. He has good bowel sounds. There is no fluid wave. There is no palpable liver or spleen tip. Axillary exam shows no bilateral axillary adenopathy. Back exam shows no tenderness over the spine, ribs or hips. Extremities shows no clubbing, cyanosis or edema. He has good range of motion of his joints. He does have some osteoarthritic changes in his joints. Skin exam shows no rashes, ecchymoses or petechia. Neurological exam shows no focal neurological deficits.  Lab Results  Component Value Date   WBC 9.4 05/08/2017   HGB 10.3 (L) 05/08/2017   HCT 31.4 (L) 05/08/2017   MCV 106 (H) 05/08/2017   PLT 125 Platelet count consistent in citrate (L) 05/08/2017     Chemistry      Component Value Date/Time   NA 139 05/08/2017 1123   NA 141 05/15/2016 1126   K 4.0 05/08/2017 1123   K 4.3 05/15/2016 1126   CL 105 05/08/2017 1123   CO2 30 05/08/2017 1123   CO2 26 05/15/2016 1126   BUN 15 05/08/2017 1123   BUN 29.1 (H) 05/15/2016 1126   CREATININE 1.2 05/08/2017 1123   CREATININE 1.7 (H) 05/15/2016 1126   GLU 90 11/15/2014      Component Value Date/Time   CALCIUM 9.1 05/08/2017 1123   CALCIUM 9.7 05/15/2016 1126   ALKPHOS 64 05/08/2017 1123   ALKPHOS 69 05/15/2016 1126   AST 22 05/08/2017 1123   AST 23  05/15/2016 1126   ALT 19 05/08/2017 1123   ALT 22 05/15/2016 1126   BILITOT 1.00 05/08/2017 1123   BILITOT 0.84 05/15/2016 1126         Impression and Plan: Jeremiah Walker is a 83 year old white male. He had a history of diffuse large cell non-Hodgkin's lymphoma. He was treated  with 6 cycles of R-CHOP. He got this treatment up in [place]. He completed this back in April 2014.   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 This could be a residual  from his chemotherapy for his lymphoma. However, I would think that this is a little bit early to show up if it was from his chemotherapy.  Right now, we will get him through the holidays.  We will try to get him through the wintertime.  It would be nice if he did not have to come back with any bad weather to deal with.  As always, it is a lot of fun to talk with him.  He has such a wide range of knowledge on multiple subjects.   [name], MD 11/14/20182:11 PM

## 2017-05-10 ENCOUNTER — Encounter: Payer: Self-pay | Admitting: Hematology & Oncology

## 2017-05-12 ENCOUNTER — Encounter: Payer: Self-pay | Admitting: Hematology & Oncology

## 2017-05-15 ENCOUNTER — Other Ambulatory Visit: Payer: Self-pay | Admitting: Family Medicine

## 2017-05-15 MED ORDER — PREDNISONE 20 MG PO TABS: ORAL_TABLET | ORAL | 0 refills | Status: DC

## 2017-05-15 NOTE — Telephone Encounter (Signed)
Pt is requesting refill on prednisone. Please adivse.

## 2017-05-16 NOTE — Telephone Encounter (Signed)
This was refilled for him yesterday  Meds ordered this encounter  Medications  . predniSONE (DELTASONE) 20 MG tablet    Sig: Take 1-2 pills by mouth daily for 3 days.  May repeat as needed for gout flare    Dispense:  12 tablet    Refill:  0

## 2017-05-23 DIAGNOSIS — Z85828 Personal history of other malignant neoplasm of skin: Secondary | ICD-10-CM | POA: Diagnosis not present

## 2017-05-23 DIAGNOSIS — L821 Other seborrheic keratosis: Secondary | ICD-10-CM | POA: Diagnosis not present

## 2017-05-23 DIAGNOSIS — Z08 Encounter for follow-up examination after completed treatment for malignant neoplasm: Secondary | ICD-10-CM | POA: Diagnosis not present

## 2017-05-23 DIAGNOSIS — L570 Actinic keratosis: Secondary | ICD-10-CM | POA: Diagnosis not present

## 2017-05-23 DIAGNOSIS — L578 Other skin changes due to chronic exposure to nonionizing radiation: Secondary | ICD-10-CM | POA: Diagnosis not present

## 2017-05-28 ENCOUNTER — Ambulatory Visit (INDEPENDENT_AMBULATORY_CARE_PROVIDER_SITE_OTHER): Payer: Medicare Other

## 2017-05-28 ENCOUNTER — Encounter: Payer: Self-pay | Admitting: Podiatry

## 2017-05-28 ENCOUNTER — Ambulatory Visit (INDEPENDENT_AMBULATORY_CARE_PROVIDER_SITE_OTHER): Payer: Medicare Other | Admitting: Podiatry

## 2017-05-28 DIAGNOSIS — M109 Gout, unspecified: Secondary | ICD-10-CM

## 2017-05-28 DIAGNOSIS — B351 Tinea unguium: Secondary | ICD-10-CM | POA: Diagnosis not present

## 2017-05-28 DIAGNOSIS — M778 Other enthesopathies, not elsewhere classified: Secondary | ICD-10-CM

## 2017-05-28 DIAGNOSIS — M7751 Other enthesopathy of right foot: Secondary | ICD-10-CM | POA: Diagnosis not present

## 2017-05-28 DIAGNOSIS — M79609 Pain in unspecified limb: Secondary | ICD-10-CM

## 2017-05-28 DIAGNOSIS — M779 Enthesopathy, unspecified: Principal | ICD-10-CM

## 2017-05-28 MED ORDER — METHYLPREDNISOLONE 4 MG PO TBPK: ORAL_TABLET | ORAL | 0 refills | Status: DC

## 2017-05-29 ENCOUNTER — Ambulatory Visit: Payer: Medicare Other | Admitting: Family Medicine

## 2017-05-29 NOTE — Progress Notes (Signed)
He presents today states that he has recently had another gout attack to the first metatarsophalangeal joint of the right foot that he feels that this 1 was much less intense than previous gout attacks which he usually has in his dorsal lateral foot.  He states that he took one prednisone that did not help.  He also states that his toenails are long and would like to have them trimmed.  Objective: Vital signs are stable alert and oriented x3.  Pulses are palpable.  Toenails are long thick dystrophic sharply incurvated painful on palpation.  Mild erythema and limited range of motion overlying the first metatarsophalangeal joint of the right foot is mildly warm to the touch but does not demonstrate any signs of infection.  No open lesions or wounds.  Assessment: Gouty capsulitis first metatarsophalangeal joint right foot pain in limb secondary to nail dystrophy possible onychomycosis.  Plan: After verbal permission and a sterile bite Betadine skin prep around the first metatarsophalangeal joint I injected 20 mg of Kenalog with 5 mg of Marcaine around the first metatarsophalangeal joint of the right foot.  Also placed him on a 6-day Medrol Dosepak.  Debrided his toenails 1 through 5 bilaterally with no iatrogenic lesions.  And I will follow-up with him in a few weeks if necessary.

## 2017-06-06 ENCOUNTER — Encounter: Payer: Self-pay | Admitting: Family Medicine

## 2017-06-06 DIAGNOSIS — G894 Chronic pain syndrome: Secondary | ICD-10-CM

## 2017-06-06 MED ORDER — HYDROCODONE-ACETAMINOPHEN 7.5-325 MG PO TABS: 1.0000 | ORAL_TABLET | Freq: Four times a day (QID) | ORAL | 0 refills | Status: DC | PRN

## 2017-06-10 MED FILL — HYDROCODON-APAP 7.5-325: 7.5-325 | 25 days supply | Qty: 100 | Fill #0

## 2017-06-15 ENCOUNTER — Encounter: Payer: Self-pay | Admitting: Family Medicine

## 2017-06-17 MED ORDER — AMOXICILLIN 500 MG PO CAPS: ORAL_CAPSULE | ORAL | 0 refills | Status: DC

## 2017-06-27 ENCOUNTER — Encounter: Payer: Self-pay | Admitting: Podiatry

## 2017-06-27 ENCOUNTER — Ambulatory Visit (INDEPENDENT_AMBULATORY_CARE_PROVIDER_SITE_OTHER): Payer: Medicare Other | Admitting: Podiatry

## 2017-06-27 DIAGNOSIS — M779 Enthesopathy, unspecified: Principal | ICD-10-CM

## 2017-06-27 DIAGNOSIS — M7751 Other enthesopathy of right foot: Secondary | ICD-10-CM

## 2017-06-27 DIAGNOSIS — M778 Other enthesopathies, not elsewhere classified: Secondary | ICD-10-CM

## 2017-06-29 NOTE — Progress Notes (Signed)
He presents today for follow-up of his gouty arthritis to his right foot.  He states that he is currently doing fine but I have had another gout attack since I saw you last.  He states that he took a 20 mg prednisone tablet and his pain has resolved.  Objective: No reproducible pain today on physical exam pulses remain palpable no open lesions or wounds.  Assessment: History of gouty capsulitis.  Plan: No injections today recommended that he follow-up with primary care for his 20 mg of prednisone that he takes.  I recommended that he have some at home to take in case he does have a flare since this seems to work best for him.  Otherwise he will follow-up with [place] for injections.  Sign visit

## 2017-07-08 ENCOUNTER — Telehealth (INDEPENDENT_AMBULATORY_CARE_PROVIDER_SITE_OTHER): Payer: Self-pay | Admitting: Physical Medicine and Rehabilitation

## 2017-07-08 NOTE — Telephone Encounter (Signed)
Could either repeat or do triggerpoint with OV

## 2017-07-08 NOTE — Telephone Encounter (Signed)
Patient said the pain is just starting to come back at L4-5 and the muscle is more of a problem. Scheduled for OV with trigger point inj.

## 2017-07-10 NOTE — Progress Notes (Addendum)
[facility] [address] [place], [place] [address] [phone]  Date:  07/11/2017   Name:  Jeremiah Walker   DOB:  [birth year]   MRN:  [id number]  PCP:  Jeremiah Mclean, MD    Chief Complaint: Fatigue (c/o fatigue and difficulty keeping up with exercising and walking. )   History of Present Illness:  Jeremiah Walker is a 84 y.o. very pleasant male patient who presents with the following:  History of CML, followed by Jeremiah Walker- from their last visit in November:  Jeremiah Walker is a 84 year old white male. He had a history of diffuse large cell non-Hodgkin's lymphoma. He was treated  with 6 cycles of R-CHOP. He got this treatment up in [place]. He completed this back in April 2014.  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 This could be a residual from his chemotherapy for his lymphoma. However, I would think that this is a little bit early to show up if it was from his chemotherapy  He is here today with complaint of feeling tired and fatigued.  He generally works out 3x a week, but has not had the energy to do this for the last several days No fever or chills He feels like this is not like his typical anemia sx He sees Jeremiah Walker at [facility] for his back- he is planning to have a steroid shot in the near future He last had a depo medrol shot last February  He will use prednisone on occasion for gout- does not take this on a regular basis No medication change recently He has not noted any CP or SOB.    He was not able to work out for several weeks due to some renovations at his gym.  He started back about a month ago, was starting to feel like he was getting his strength back when he started to feel tired like this  He feels like he is not emptying his bladder completely, but this is not really new He is using his bethanachol and his saw palmetto on a regular basis No dysuria or hematuria No cough No  abd pain No GI changes   He uses gabapentin for his neuropathy- TID generally  Patient Active Problem List   Diagnosis Date Noted  . Chronic myelomonocytic leukemia not having achieved remission ([place]) 12/04/2016  . Chronic pain syndrome 09/26/2015  . De Quervain's tenosynovitis, right 09/13/2015  . Anemia due to antineoplastic chemotherapy 08/15/2015  . DLBCL (diffuse large B cell lymphoma) ([place]) 08/15/2015  . Skin lesion of face 05/21/2015  . Peripheral neuropathy due to chemotherapy ([place]) 02/09/2015  . Benign prostatic hyperplasia with urinary obstruction 02/09/2015  . Bilateral hearing loss 02/09/2015  . Low back pain 02/09/2015  . Chronic kidney disease (CKD), stage III (moderate) ([place]) 02/09/2015  . Essential (primary) hypertension 02/09/2015  . Gastro-esophageal reflux disease without esophagitis 02/09/2015  . Anxiety, generalized 02/09/2015  . Cannot sleep 02/09/2015  . Combined fat and carbohydrate induced hyperlipemia 02/09/2015    Past Medical History:  Diagnosis Date  . Anemia   . Anemia due to antineoplastic chemotherapy 08/15/2015  . Anxiety   . Arthritis   . Cancer ([place])   . Cataract   . Chronic kidney disease (CKD), stage III (moderate) ([facility]) 02/09/2015   Controlled  . Combined fat and carbohydrate induced hyperlipemia 02/09/2015   Controlled  . Depression   . DLBCL (diffuse large B cell  lymphoma) ([place]) 08/15/2015  . Essential (primary) hypertension 02/09/2015  . Gastro-esophageal reflux disease without esophagitis 02/09/2015   Controlled  . Neuromuscular disorder ([place])   . Tinnitus     Past Surgical History:  Procedure Laterality Date  . CATARACT EXTRACTION, BILATERAL    . COLONOSCOPY  May 2011  . CYSTOSCOPY    . TOTAL HIP ARTHROPLASTY  2003   Right   . TOTAL HIP ARTHROPLASTY  April, 2011   Left  . TRIGGER FINGER RELEASE  Nov 2013  . TURBINECTOMY  2006  . UPPER GI ENDOSCOPY  Nov 2015    Social History   Tobacco Use  . Smoking status: Former  Research scientist (life sciences)  . Smokeless tobacco: Never Used  . Tobacco comment: Quit >50 years ago  Substance Use Topics  . Alcohol use: Yes    Alcohol/week: 0.6 - 2.4 oz    Types: 1 - 4 Shots of liquor per week  . Drug use: No    Family History  Problem Relation Age of Onset  . Anuerysm Mother 29       Deceased  . Colon cancer Father 70       Deceased  . Colon cancer Brother   . Prostate cancer Brother        Deceased  . Alzheimer's disease Brother   . Heart disease Brother   . Alzheimer's disease Paternal Grandmother   . Early death Maternal Grandmother        Unknown  . Obesity Son   . Obesity Son        Gastric Bypass  . Healthy Son   . Healthy Daughter   . Diabetes Neg Hx     Allergies  Allergen Reactions  . Nsaids Other (See Comments) and Tinitus    Bruising   . Tolmetin Other (See Comments)    Bruising  . Ciprofloxacin Other (See Comments)    Other reaction(s): Other (See Comments) Achilles tendon Achilles tendon  . Statins Nausea And Vomiting and Other (See Comments)    Other reaction(s): Kidney issues Muscle pain Muscle pain Other reaction(s): Kidney issues Muscle pain  . Requip [Ropinirole Hcl] Anxiety  . Ropinirole Anxiety    Mood swing    Medication list has been reviewed and updated.  Current Outpatient Medications on File Prior to Visit  Medication Sig Dispense Refill  . allopurinol (ZYLOPRIM) 100 MG tablet Take 2 tablets (200 mg total) by mouth daily. 180 tablet 3  . ALPRAZolam (XANAX) 0.5 MG tablet Take 1 tablet (0.5 mg total) 3 (three) times daily as needed by mouth for anxiety. 90 tablet 2  . amoxicillin (AMOXIL) 500 MG capsule Take 4 caps (2gm) by mouth once prior to dental procedure 20 capsule 0  . azelastine (ASTELIN) 0.1 % nasal spray Place 2 sprays into both nostrils 2 (two) times daily. Use in each nostril as directed 30 mL 6  . bethanechol (URECHOLINE) 25 MG tablet TAKE 1 TABLET BY MOUTH THREE TIMES DAILY 90 tablet 5  . Calcium 500 MG tablet Take  600 mg by mouth daily.    . Cholecalciferol (VITAMIN D3) 2000 UNITS capsule Take 2,000 Units by mouth daily.    Marland Kitchen gabapentin (NEURONTIN) 300 MG capsule TAKE 3 CAPSULES BY MOUTH THREE TIMES DAILY, PLUS 2 CAPSULES EVERY DAY AS NEEDED 990 capsule 0  . Multiple Vitamin (MULTIVITAMIN) tablet Take 1 tablet by mouth daily.    . niacin 500 MG tablet Take 500 mg by mouth at bedtime.    . Omega-3  Fatty Acids (OMEGA-3 FISH OIL PO) Take 2 each by mouth every morning. EPA/DHA 520/350    . polyethylene glycol (MIRALAX / GLYCOLAX) packet Take 17 g by mouth daily as needed.    . predniSONE (DELTASONE) 20 MG tablet Take 1-2 pills by mouth daily for 3 days.  May repeat as needed for gout flare 12 tablet 0  . psyllium (METAMUCIL) 58.6 % powder Take 1 packet by mouth daily. 1 Teaspoon daily    . S-Adenosylmethionine (SAM-E) 400 MG TABS Take 400 mg by mouth daily.    . vitamin C (ASCORBIC ACID) 500 MG tablet Take 500 mg by mouth daily.    Marland Kitchen zinc gluconate 50 MG tablet Take 50 mg by mouth daily.     Current Facility-Administered Medications on File Prior to Visit  Medication Dose Route Frequency Provider Last Rate Last Dose  . bupivacaine (MARCAINE) 0.5 % (with pres) injection 50 mL  50 mL Other Once Magnus Sinning, MD      . lidocaine (PF) (XYLOCAINE) 1 % injection 0.3 mL  0.3 mL Other Once Magnus Sinning, MD        Review of Systems:  As per HPI- otherwise negative.   Physical Examination: Vitals:   07/11/17 1035  BP: 112/74  Pulse: 77  Temp: 97.9 F (36.6 C)  SpO2: 99%   Vitals:   07/11/17 1035  Weight: 220 lb 3.2 oz (99.9 kg)  Height: 5\' 9"  (1.753 m)   Body mass index is 32.52 kg/m. Ideal Body Weight: Weight in (lb) to have BMI = 25: 168.9  GEN: WDWN, NAD, Non-toxic, A & O x 3, obese, looks well  HEENT: Atraumatic, Normocephalic. Neck supple. No masses, No LAD.  Bilateral TM wnl, oropharynx normal.  PEERL,EOMI.   Ears and Nose: No external deformity. CV: RRR, No M/G/R. No JVD. No  thrill. No extra heart sounds. PULM: CTA B, no wheezes, crackles, rhonchi. No retractions. No resp. distress. No accessory muscle use. ABD: S, NT, ND. No rebound. No HSM. EXTR: No c/c/e NEURO Normal gait.  PSYCH: Normally interactive. Conversant. Not depressed or anxious appearing.  Calm demeanor.   EKG: NSR, no ST elevation or depression  Assessment and Plan: Urinary frequency - Plan: POCT urinalysis dipstick, Urine Culture  Chronic pain syndrome - Plan: HYDROcodone-acetaminophen (NORCO) 7.5-325 MG tablet  Fatigue, unspecified type  Decreased exercise tolerance - Plan: Comprehensive metabolic panel, TSH, CBC, EKG 12-Lead  Vitamin D deficiency - Plan: Vitamin D (25 hydroxy)  Here today with non- specific fatigue- specifically feeling like he does not want to do his normal exercise routine for the last couple of weeks He has a history of known NHL which is followed by Jeremiah Walker No sign of cardiac issues Will check urine and labs, Will plan further follow- up pending labs.   Signed Lamar Blinks, MD  Received his labs so far 1/18- message to pt Will need to follow-up on trace blood in urine when urine culture returns  I am still waiting on your urine culture Vitamin D looks great Metabolic profile is normal except for minimally low kidney filtration rate Thyroid normal Your blood count is about like normal for you- mild anemia and low platelets are stable.   I do wonder if you might have a b12 or folate deficiency- this is suggested as a possibility by your high MCV (mean cell volume or average red cell size).   Urine was normal except for a trace of blood on the dip  Would you  be interested in having blood drawn to check your B12 and folate levels?  I can order these for you if you would like  1/19- received urine culture- negative- and also mychart message back from pt as follows: I am available just about every day. Monday or Tuesday morning would be OK. I'm feeling  better, took a short walk today and will try the machines in the gym tomorrow, but at a lower resistance until I fell stronger Replied to him: Hi Hal- I got your urine culture back and it is negative for infection Please come by the clinic at your convenience next week and we can draw the B12 and folate levels for you. I would also like to collect a urine for microscope evaluation due to trace blood on your dipstick.   You don't need an appt- just come in when it suits you! Results for orders placed or performed in visit on 07/11/17  Vitamin D (25 hydroxy)  Result Value Ref Range   VITD 47.92 30.00 - 100.00 ng/mL  Comprehensive metabolic panel  Result Value Ref Range   Sodium 139 135 - 145 mEq/L   Potassium 4.2 3.5 - 5.1 mEq/L   Chloride 104 96 - 112 mEq/L   CO2 29 19 - 32 mEq/L   Glucose, Bld 94 70 - 99 mg/dL   BUN 22 6 - 23 mg/dL   Creatinine, Ser 1.31 0.40 - 1.50 mg/dL   Total Bilirubin 0.9 0.2 - 1.2 mg/dL   Alkaline Phosphatase 49 39 - 117 U/L   AST 18 0 - 37 U/L   ALT 11 0 - 53 U/L   Total Protein 6.9 6.0 - 8.3 g/dL   Albumin 3.9 3.5 - 5.2 g/dL   Calcium 9.0 8.4 - 10.5 mg/dL   GFR 54.94 (L) >60.00 mL/min  TSH  Result Value Ref Range   TSH 1.13 0.35 - 4.50 uIU/mL  CBC  Result Value Ref Range   WBC 4.2 4.0 - 10.5 K/uL   RBC 3.13 (L) 4.22 - 5.81 Mil/uL   Platelets 120.0 (L) 150.0 - 400.0 K/uL   Hemoglobin 11.1 (L) 13.0 - 17.0 g/dL   HCT 33.6 (L) 39.0 - 52.0 %   MCV 107.3 (H) 78.0 - 100.0 fl   MCHC 33.0 30.0 - 36.0 g/dL   RDW 15.5 11.5 - 15.5 %  POCT urinalysis dipstick  Result Value Ref Range   Color, UA yellow yellow   Clarity, UA clear clear   Glucose, UA negative negative mg/dL   Bilirubin, UA negative negative   Ketones, POC UA negative negative mg/dL   Spec Grav, UA 1.025 1.010 - 1.025   Blood, UA trace-lysed (A) negative   pH, UA 6.0 5.0 - 8.0   Protein Ur, POC negative negative mg/dL   Urobilinogen, UA 0.2 0.2 or 1.0 E.U./dL   Nitrite, UA Negative Negative    Leukocytes, UA Negative Negative

## 2017-07-11 ENCOUNTER — Ambulatory Visit (INDEPENDENT_AMBULATORY_CARE_PROVIDER_SITE_OTHER): Payer: Medicare Other | Admitting: Family Medicine

## 2017-07-11 ENCOUNTER — Encounter: Payer: Self-pay | Admitting: Family Medicine

## 2017-07-11 VITALS — BP 112/74 | HR 77 | Temp 97.9°F | Ht 69.0 in | Wt 220.2 lb

## 2017-07-11 DIAGNOSIS — R6889 Other general symptoms and signs: Secondary | ICD-10-CM | POA: Diagnosis not present

## 2017-07-11 DIAGNOSIS — E538 Deficiency of other specified B group vitamins: Secondary | ICD-10-CM | POA: Diagnosis not present

## 2017-07-11 DIAGNOSIS — R3129 Other microscopic hematuria: Secondary | ICD-10-CM

## 2017-07-11 DIAGNOSIS — E559 Vitamin D deficiency, unspecified: Secondary | ICD-10-CM

## 2017-07-11 DIAGNOSIS — D7589 Other specified diseases of blood and blood-forming organs: Secondary | ICD-10-CM

## 2017-07-11 DIAGNOSIS — D539 Nutritional anemia, unspecified: Secondary | ICD-10-CM

## 2017-07-11 DIAGNOSIS — R5383 Other fatigue: Secondary | ICD-10-CM | POA: Diagnosis not present

## 2017-07-11 DIAGNOSIS — R350 Frequency of micturition: Secondary | ICD-10-CM

## 2017-07-11 DIAGNOSIS — G894 Chronic pain syndrome: Secondary | ICD-10-CM | POA: Diagnosis not present

## 2017-07-11 LAB — POCT URINALYSIS DIP (MANUAL ENTRY)
BILIRUBIN UA: NEGATIVE
GLUCOSE UA: NEGATIVE mg/dL
Ketones, POC UA: NEGATIVE mg/dL
Leukocytes, UA: NEGATIVE
NITRITE UA: NEGATIVE
PH UA: 6 (ref 5.0–8.0)
Protein Ur, POC: NEGATIVE mg/dL
SPEC GRAV UA: 1.025 (ref 1.010–1.025)
UROBILINOGEN UA: 0.2 U/dL

## 2017-07-11 LAB — CBC
HCT: 33.6 % — ABNORMAL LOW (ref 39.0–52.0)
Hemoglobin: 11.1 g/dL — ABNORMAL LOW (ref 13.0–17.0)
MCHC: 33 g/dL (ref 30.0–36.0)
MCV: 107.3 fl — AB (ref 78.0–100.0)
PLATELETS: 120 10*3/uL — AB (ref 150.0–400.0)
RBC: 3.13 Mil/uL — ABNORMAL LOW (ref 4.22–5.81)
RDW: 15.5 % (ref 11.5–15.5)
WBC: 4.2 10*3/uL (ref 4.0–10.5)

## 2017-07-11 LAB — TSH: TSH: 1.13 u[IU]/mL (ref 0.35–4.50)

## 2017-07-11 LAB — COMPREHENSIVE METABOLIC PANEL
ALBUMIN: 3.9 g/dL (ref 3.5–5.2)
ALK PHOS: 49 U/L (ref 39–117)
ALT: 11 U/L (ref 0–53)
AST: 18 U/L (ref 0–37)
BUN: 22 mg/dL (ref 6–23)
CO2: 29 mEq/L (ref 19–32)
Calcium: 9 mg/dL (ref 8.4–10.5)
Chloride: 104 mEq/L (ref 96–112)
Creatinine, Ser: 1.31 mg/dL (ref 0.40–1.50)
GFR: 54.94 mL/min — ABNORMAL LOW (ref >60.00)
GLUCOSE: 94 mg/dL (ref 70–99)
POTASSIUM: 4.2 meq/L (ref 3.5–5.1)
SODIUM: 139 meq/L (ref 135–145)
TOTAL PROTEIN: 6.9 g/dL (ref 6.0–8.3)
Total Bilirubin: 0.9 mg/dL (ref 0.2–1.2)

## 2017-07-11 LAB — VITAMIN D 25 HYDROXY (VIT D DEFICIENCY, FRACTURES): VITD: 47.92 ng/mL (ref 30.00–100.00)

## 2017-07-11 MED ORDER — HYDROCODONE-ACETAMINOPHEN 7.5-325 MG PO TABS: 1.0000 | ORAL_TABLET | Freq: Four times a day (QID) | ORAL | 0 refills | Status: DC | PRN

## 2017-07-12 ENCOUNTER — Encounter: Payer: Self-pay | Admitting: Family Medicine

## 2017-07-12 LAB — URINE CULTURE
MICRO NUMBER:: 90072431
SPECIMEN QUALITY: ADEQUATE

## 2017-07-13 ENCOUNTER — Encounter: Payer: Self-pay | Admitting: Family Medicine

## 2017-07-13 NOTE — Addendum Note (Signed)
Addended by: Lamar Blinks C on: 07/13/2017 01:27 PM   Modules accepted: Orders

## 2017-07-15 ENCOUNTER — Other Ambulatory Visit (INDEPENDENT_AMBULATORY_CARE_PROVIDER_SITE_OTHER): Payer: Medicare Other

## 2017-07-15 DIAGNOSIS — R3129 Other microscopic hematuria: Secondary | ICD-10-CM | POA: Diagnosis not present

## 2017-07-15 DIAGNOSIS — E538 Deficiency of other specified B group vitamins: Secondary | ICD-10-CM

## 2017-07-15 DIAGNOSIS — D7589 Other specified diseases of blood and blood-forming organs: Secondary | ICD-10-CM | POA: Diagnosis not present

## 2017-07-15 DIAGNOSIS — D539 Nutritional anemia, unspecified: Secondary | ICD-10-CM | POA: Diagnosis not present

## 2017-07-15 LAB — URINALYSIS, MICROSCOPIC ONLY

## 2017-07-15 LAB — FOLATE

## 2017-07-15 LAB — VITAMIN B12: VITAMIN B 12: 420 pg/mL (ref 211–911)

## 2017-07-17 ENCOUNTER — Ambulatory Visit (INDEPENDENT_AMBULATORY_CARE_PROVIDER_SITE_OTHER): Payer: Medicare Other | Admitting: Physical Medicine and Rehabilitation

## 2017-07-17 ENCOUNTER — Encounter: Payer: Self-pay | Admitting: Family Medicine

## 2017-07-17 ENCOUNTER — Encounter (INDEPENDENT_AMBULATORY_CARE_PROVIDER_SITE_OTHER): Payer: Self-pay | Admitting: Physical Medicine and Rehabilitation

## 2017-07-17 VITALS — BP 132/63 | HR 73 | Temp 97.7°F

## 2017-07-17 DIAGNOSIS — M545 Low back pain, unspecified: Secondary | ICD-10-CM

## 2017-07-17 DIAGNOSIS — M47816 Spondylosis without myelopathy or radiculopathy, lumbar region: Secondary | ICD-10-CM

## 2017-07-17 DIAGNOSIS — M609 Myositis, unspecified: Secondary | ICD-10-CM

## 2017-07-17 DIAGNOSIS — G8929 Other chronic pain: Secondary | ICD-10-CM | POA: Diagnosis not present

## 2017-07-17 NOTE — Progress Notes (Deleted)
Pt states pain in lower back mostly on the left side. Pt states pain has been going on for about 1 month. Pt states exercising and walking makes pain worse, pain medications make pain better. 7.5mg  hydrocodone  2 per day

## 2017-07-17 NOTE — Progress Notes (Signed)
Jeremiah Walker - 84 y.o. male MRN [id number]  Date of birth: Feb 28, 1928  Office Visit Note: Visit Date: 07/17/2017 PCP: [name], MD Referred by: [name], MD  Subjective: Chief Complaint  Patient presents with  . Lower Back - Pain   HPI: Jeremiah Walker is an 84 year old tall almost a year ago in February 2018.  At the time we completed diagnostic facet joint/medial branch blocks which should reduce his pain quite a bit to the point where he was really not taking much in the way of hydrocodone.  At that point he was doing physical therapy at [facility].  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  Comes in today with mostly left-sided lower back pain 1 month only.  He reports he still exercises and walking and that does make the pain worse.  He reports his pain medications do help.  He is taking 7.5 mg hydrocodone 2/day.  He is still using gabapentin throughout the day.  He does not report any specific injury or radicular complaints.  This is all axial low back pain quite severe at times.  He has had no other issues no other red flag symptoms no focal weakness.    Review of Systems  Constitutional: Negative for chills, fever, malaise/fatigue and weight loss.  HENT: Negative for hearing loss and sinus pain.   Eyes: Negative for blurred vision, double vision and photophobia.  Respiratory: Negative for cough and shortness of breath.   Cardiovascular: Negative for chest pain, palpitations and leg swelling.  Gastrointestinal: Negative for abdominal pain, nausea and vomiting.  Genitourinary: Negative for flank pain.  Musculoskeletal: Positive for back pain. Negative for myalgias.  Skin: Negative for itching and rash.  Neurological: Negative for tremors, focal weakness and weakness.  Endo/Heme/Allergies: Negative.   Psychiatric/Behavioral: Negative for depression.  All other systems reviewed and  are negative.  Otherwise per HPI.  Assessment & Plan: Visit Diagnoses:  1. Chronic left-sided low back pain without sciatica   2. Spondylosis without myelopathy or radiculopathy, lumbar region   3. Myofascitis     Plan: Findings:  Chronic history of back pain as well as pain from polyneuropathy and gout.  He has been on chronic opioid medications for quite some time but has reduced him at least from the time I first saw him.  He did extremely well with facet joint diagnostic and therapeutic blocks.  This was over a year ago.  I do not think the pain is having right now is really facet mediated overall.  He has a focal trigger point in the left quadratus lumborum area and is a little bit more lateral than the facet joints.  We did complete trigger point injection today.  He knows if this does not seem to help very much to give [place] a call back.  I would look at repeating the medial branch facet joint blocks.  If he did well with that we could still look at radiofrequency ablation of the facet joints which I do think would help him overall.  We will still follow with primary care physician for medication.  He is not really taking that much but it is something that he does use Xanax with as well.    Meds & Orders: No orders of the defined types were placed in this encounter.   Orders Placed This Encounter  Procedures  . Trigger Point Inj  Follow-up: Return if symptoms worsen or fail to improve.   Procedures: Trigger Point Inj Date/Time: 07/17/2017 10:50 AM Performed by: Magnus Sinning, MD Authorized by: Magnus Sinning, MD   Total # of Trigger Points:  1    No notes on file   Clinical History: Lumbar spine MRI dated 05/29/2016   L4-5: Anterolisthesis, uncovered disc with small bulge, central protrusion, moderate facet and ligamentum flavum hypertrophy, and large right foraminal and extra foraminal marginal osteophyte. Severe right foraminal narrowing. Mild left foraminal  narrowing. Effacement of lateral recesses with contact upon the descending right L5 nerve root. No significant canal stenosis.   L5-S1: Small disc bulge and mild facet/ligamentum flavum hypertrophy. Mild bilateral foraminal narrowing. Right-sided 5 mm T2 hyperintense structure arising from the right facet joint possibly impinging the exiting right L5 nerve root likely a synovial cyst  Mild S-shaped lumbar curvature, grade 1 L4-5 anterolisthesis, and prominent discogenic and facet arthropathy. No acute osseous abnormality.  No high-grade canal stenosis. 3. Multiple levels of foraminal narrowing moderate at left L2-3, moderate at right L3-4, and severe at right L4-5 levels. 4. Large marginal osteophytes with contact upon exiting left L2 nerve root at the L2-3 level and right L3 nerve root at the L3-4 level.   He reports that he has quit smoking. he has never used smokeless tobacco.  Recent Labs    12/04/16 0758 02/06/17 1510  LABURIC 9.2* 7.0    Objective:  VS:  HT:    WT:   BMI:     BP:132/63  HR:73bpm  TEMP:97.7 F (36.5 C)(Oral)  RESP:96 % Physical Exam  Constitutional: He is oriented to person, place, and time. He appears well-developed and well-nourished. No distress.  HENT:  Head: Normocephalic and atraumatic.  Eyes: Conjunctivae are normal. Pupils are equal, round, and reactive to light.  Neck: Normal range of motion. Neck supple.  Cardiovascular: Regular rhythm and intact distal pulses.  Pulmonary/Chest: Effort normal. No respiratory distress.  Musculoskeletal:  Patient has some difficulty arising from a seated position.  He does have pain with extension rotation of the lumbar spine.  He has a focal trigger point in the quadratus lumborum on the left may be even part of the latissimus dorsi distally.  He has good distal strength without clonus.  Neurological: He is alert and oriented to person, place, and time. He exhibits normal muscle tone. Coordination normal.    Skin: Skin is warm and dry. No rash noted. No erythema.  Psychiatric: He has a normal mood and affect.  Nursing note and vitals reviewed.   Ortho Exam Imaging: No results found.  Past Medical/Family/Surgical/Social History: Medications & Allergies reviewed per EMR Patient Active Problem List   Diagnosis Date Noted  . 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 Chronic pain syndrome 09/26/2015  . De Quervain's tenosynovitis, right 09/13/2015  . Anemia due to antineoplastic chemotherapy 08/15/2015  . DLBCL (diffuse large B cell lymphoma) ([place]) 08/15/2015  . Skin lesion of face 05/21/2015  . Peripheral neuropathy due to chemotherapy ([place]) 02/09/2015  . Benign prostatic hyperplasia with urinary obstruction 02/09/2015  . Bilateral hearing loss 02/09/2015  . Low back pain 02/09/2015  . Chronic kidney disease (CKD), stage III (moderate) ([place]) 02/09/2015  . Essential (primary) hypertension 02/09/2015  . Gastro-esophageal reflux disease without esophagitis 02/09/2015  . Anxiety, generalized 02/09/2015  . Cannot sleep 02/09/2015  . Combined fat and carbohydrate induced hyperlipemia 02/09/2015   Past Medical History:  Diagnosis Date  . Anemia   .  Anemia due to antineoplastic chemotherapy 08/15/2015  . Anxiety   . Arthritis   . Cancer ([place])   . Cataract   . Chronic kidney disease (CKD), stage III (moderate) (H[facility]) 02/09/2015   Controlled  . Combined fat and carbohydrate induced hyperlipemia 02/09/2015   Controlled  . Depression   . DLBCL (diffuse large B cell lymphoma) ([place]) 08/15/2015  . Essential (primary) hypertension 02/09/2015  . Gastro-esophageal reflux disease without esophagitis 02/09/2015   Controlled  . Neuromuscular disorder ([place])   . Tinnitus    Family History  Problem Relation Age of Onset  . Anuerysm Mother 71       Deceased  . Colon cancer Father 35       Deceased  . Colon cancer Brother   . Prostate cancer Brother         Deceased  . Alzheimer's disease Brother   . Heart disease Brother   . Alzheimer's disease Paternal Grandmother   . Early death Maternal Grandmother        Unknown  . Obesity Son   . Obesity Son        Gastric Bypass  . Healthy Son   . Healthy Daughter   . Diabetes Neg Hx    Past Surgical History:  Procedure Laterality Date  . CATARACT EXTRACTION, BILATERAL    . COLONOSCOPY  May 2011  . CYSTOSCOPY    . TOTAL HIP ARTHROPLASTY  2003   Right   . TOTAL HIP ARTHROPLASTY  April, 2011   Left  . TRIGGER FINGER RELEASE  Nov 2013  . TURBINECTOMY  2006  . UPPER GI ENDOSCOPY  Nov 2015   Social History   Occupational History  . Not on file  Tobacco Use  . Smoking status: Former Research scientist (life sciences)  . Smokeless tobacco: Never Used  . Tobacco comment: Quit >50 years ago  Substance and Sexual Activity  . Alcohol use: Yes    Alcohol/week: 0.6 - 2.4 oz    Types: 1 - 4 Shots of liquor per week  . Drug use: No  . Sexual activity: Not on file

## 2017-07-21 ENCOUNTER — Other Ambulatory Visit: Payer: Self-pay | Admitting: Family Medicine

## 2017-07-21 DIAGNOSIS — M1000 Idiopathic gout, unspecified site: Secondary | ICD-10-CM

## 2017-08-08 DIAGNOSIS — L820 Inflamed seborrheic keratosis: Secondary | ICD-10-CM | POA: Diagnosis not present

## 2017-08-08 DIAGNOSIS — D485 Neoplasm of uncertain behavior of skin: Secondary | ICD-10-CM | POA: Diagnosis not present

## 2017-08-15 ENCOUNTER — Encounter (INDEPENDENT_AMBULATORY_CARE_PROVIDER_SITE_OTHER): Payer: Self-pay | Admitting: Physical Medicine and Rehabilitation

## 2017-08-16 ENCOUNTER — Encounter: Payer: Self-pay | Admitting: Family Medicine

## 2017-08-16 DIAGNOSIS — G2581 Restless legs syndrome: Secondary | ICD-10-CM

## 2017-08-16 DIAGNOSIS — G4700 Insomnia, unspecified: Secondary | ICD-10-CM

## 2017-08-16 MED ORDER — ALPRAZOLAM 0.5 MG PO TABS: 0.5000 mg | ORAL_TABLET | Freq: Three times a day (TID) | ORAL | 2 refills | Status: DC | PRN

## 2017-08-20 ENCOUNTER — Other Ambulatory Visit: Payer: Self-pay | Admitting: Family Medicine

## 2017-08-20 DIAGNOSIS — M1000 Idiopathic gout, unspecified site: Secondary | ICD-10-CM

## 2017-08-21 ENCOUNTER — Inpatient Hospital Stay: Payer: Medicare Other

## 2017-08-21 ENCOUNTER — Other Ambulatory Visit: Payer: Self-pay

## 2017-08-21 ENCOUNTER — Inpatient Hospital Stay: Payer: Medicare Other | Attending: Hematology & Oncology | Admitting: Hematology & Oncology

## 2017-08-21 ENCOUNTER — Encounter (INDEPENDENT_AMBULATORY_CARE_PROVIDER_SITE_OTHER): Payer: Self-pay | Admitting: Physical Medicine and Rehabilitation

## 2017-08-21 DIAGNOSIS — R5383 Other fatigue: Secondary | ICD-10-CM | POA: Diagnosis not present

## 2017-08-21 DIAGNOSIS — Z79899 Other long term (current) drug therapy: Secondary | ICD-10-CM | POA: Insufficient documentation

## 2017-08-21 DIAGNOSIS — Z8572 Personal history of non-Hodgkin lymphomas: Secondary | ICD-10-CM | POA: Insufficient documentation

## 2017-08-21 DIAGNOSIS — C9310 Chronic myelomonocytic leukemia not having achieved remission: Secondary | ICD-10-CM | POA: Insufficient documentation

## 2017-08-21 DIAGNOSIS — R531 Weakness: Secondary | ICD-10-CM

## 2017-08-21 DIAGNOSIS — Z9221 Personal history of antineoplastic chemotherapy: Secondary | ICD-10-CM

## 2017-08-21 LAB — CBC WITH DIFFERENTIAL (CANCER CENTER ONLY)
Basophils Absolute: 0.1 10*3/uL (ref 0.0–0.1)
Basophils Relative: 2 %
EOS PCT: 1 %
Eosinophils Absolute: 0.1 10*3/uL (ref 0.0–0.5)
HCT: 33.6 % — ABNORMAL LOW (ref 38.7–49.9)
Hemoglobin: 11.1 g/dL — ABNORMAL LOW (ref 13.0–17.1)
LYMPHS ABS: 1.6 10*3/uL (ref 0.9–3.3)
LYMPHS PCT: 33 %
MCH: 35.6 pg — AB (ref 28.0–33.4)
MCHC: 33 g/dL (ref 32.0–35.9)
MCV: 107.7 fL — ABNORMAL HIGH (ref 82.0–98.0)
MONO ABS: 2.2 10*3/uL — AB (ref 0.1–0.9)
MONOS PCT: 45 %
Neutro Abs: 1 10*3/uL — ABNORMAL LOW (ref 1.5–6.5)
Neutrophils Relative %: 19 %
PLATELETS: 112 10*3/uL — AB (ref 145–400)
RBC: 3.12 MIL/uL — ABNORMAL LOW (ref 4.20–5.70)
RDW: 13.9 % (ref 11.1–15.7)
WBC Count: 4.9 10*3/uL (ref 4.0–10.0)

## 2017-08-21 LAB — CMP (CANCER CENTER ONLY)
ALT: 17 U/L (ref 0–55)
AST: 18 U/L (ref 5–34)
Albumin: 3.6 g/dL (ref 3.5–5.0)
Alkaline Phosphatase: 59 U/L (ref 40–150)
Anion gap: 7 (ref 3–11)
BUN: 19 mg/dL (ref 7–26)
CHLORIDE: 105 mmol/L (ref 98–109)
CO2: 28 mmol/L (ref 22–29)
CREATININE: 1.21 mg/dL (ref 0.70–1.30)
Calcium: 9.3 mg/dL (ref 8.4–10.4)
GFR, Est AFR Am: >60 mL/min (ref >60)
GFR, Estimated: 52 mL/min — ABNORMAL LOW (ref >60)
GLUCOSE: 84 mg/dL (ref 70–140)
Potassium: 4.2 mmol/L (ref 3.5–5.1)
SODIUM: 140 mmol/L (ref 136–145)
Total Bilirubin: 0.6 mg/dL (ref 0.2–1.2)
Total Protein: 6.8 g/dL (ref 6.4–8.3)

## 2017-08-21 LAB — IRON AND TIBC
Iron: 97 ug/dL (ref 42–163)
SATURATION RATIOS: 42 % (ref 42–163)
TIBC: 233 ug/dL (ref 202–409)
UIBC: 136 ug/dL

## 2017-08-21 LAB — FERRITIN: FERRITIN: 192 ng/mL (ref 22–316)

## 2017-08-21 LAB — RETICULOCYTES
RBC.: 3.19 MIL/uL — AB (ref 4.20–5.82)
RETIC COUNT ABSOLUTE: 60.6 10*3/uL (ref 34.8–93.9)
Retic Ct Pct: 1.9 % — ABNORMAL HIGH (ref 0.8–1.8)

## 2017-08-21 LAB — LACTATE DEHYDROGENASE: LDH: 199 U/L (ref 125–245)

## 2017-08-21 LAB — SAVE SMEAR

## 2017-08-21 NOTE — Progress Notes (Signed)
Hematology and Oncology Follow Up Visit  Jeremiah Walker [id number] Jan 24, 1928 84 y.o. 08/21/2017   Principle Diagnosis:    History of diffuse large cell non-Hodgkin's lymphoma-treated in [place]  Chronic Myelomonocytic Leukemia (CMMoL) - Normal cytogenetics  Current Therapy:    Observation     Interim History:  Jeremiah Walker is back for follow-up.  He made it through the holiday season.  He is doing all right.  He is exercising.  He does feel tired.  He does get fatigued more easily.  I am not sure as to why this is.  It is certainly possible that this could reflect his underlying myelodysplastic/myeloproliferative disease state.  However, I do not think that this is enough that we have to treat him.  The treatment options are limited.  He is eating well.  He has had no nausea or vomiting.  He is had no bleeding.  There is been no change in bowel or bladder habits.  He has had no leg swelling.  He has had no rashes.  Overall, his performance status is ECOG 1.  Medications:  Current Outpatient Medications:  .  allopurinol (ZYLOPRIM) 100 MG tablet, Take 1 tablet (100 mg total) by mouth daily., Disp: 30 tablet, Rfl: 1 .  ALPRAZolam (XANAX) 0.5 MG tablet, Take 1 tablet (0.5 mg total) by mouth 3 (three) times daily as needed for anxiety., Disp: 90 tablet, Rfl: 2 .  azelastine (ASTELIN) 0.1 % nasal spray, Place 2 sprays into both nostrils 2 (two) times daily. Use in each nostril as directed, Disp: 30 mL, Rfl: 6 .  bethanechol (URECHOLINE) 25 MG tablet, TAKE 1 TABLET BY MOUTH THREE TIMES DAILY, Disp: 90 tablet, Rfl: 5 .  Calcium 500 MG tablet, Take 600 mg by mouth daily., Disp: , Rfl:  .  Cholecalciferol (VITAMIN D3) 2000 UNITS capsule, Take 2,000 Units by mouth daily., Disp: , Rfl:  .  gabapentin (NEURONTIN) 300 MG capsule, TAKE 3 CAPSULES BY MOUTH THREE TIMES DAILY, PLUS 2 CAPSULES EVERY DAY AS NEEDED, Disp: 990 capsule, Rfl: 0 .  HYDROcodone-acetaminophen (NORCO) 7.5-325 MG  tablet, Take 1 tablet by mouth every 6 (six) hours as needed for moderate pain., Disp: 100 tablet, Rfl: 0 .  Multiple Vitamin (MULTIVITAMIN) tablet, Take 1 tablet by mouth daily., Disp: , Rfl:  .  niacin 500 MG tablet, Take 500 mg by mouth at bedtime., Disp: , Rfl:  .  Omega-3 Fatty Acids (OMEGA-3 FISH OIL PO), Take 2 each by mouth every morning. EPA/DHA 520/350, Disp: , Rfl:  .  polyethylene glycol (MIRALAX / GLYCOLAX) packet, Take 17 g by mouth daily as needed., Disp: , Rfl:  .  predniSONE (DELTASONE) 20 MG tablet, Take 1-2 pills by mouth daily for 3 days.  May repeat as needed for gout flare, Disp: 12 tablet, Rfl: 0 .  psyllium (METAMUCIL) 58.6 % powder, Take 1 packet by mouth daily. 1 Teaspoon daily, Disp: , Rfl:  .  S-Adenosylmethionine (SAM-E) 400 MG TABS, Take 400 mg by mouth daily., Disp: , Rfl:  .  vitamin C (ASCORBIC ACID) 500 MG tablet, Take 500 mg by mouth daily., Disp: , Rfl:  .  zinc gluconate 50 MG tablet, Take 50 mg by mouth daily., Disp: , Rfl:  .  amoxicillin (AMOXIL) 500 MG capsule, Take 4 caps (2gm) by mouth once prior to dental procedure (Patient not taking: Reported on 08/21/2017), Disp: 20 capsule, Rfl: 0  Allergies:  Allergies  Allergen Reactions  . Nsaids Other (See Comments) and Tinitus  Bruising   . Tolmetin Other (See Comments)    Bruising  . Ciprofloxacin Other (See Comments)    Other reaction(s): Other (See Comments) Achilles tendon Achilles tendon  . Statins Nausea And Vomiting and Other (See Comments)    Other reaction(s): Kidney issues Muscle pain Muscle pain Other reaction(s): Kidney issues Muscle pain  . Requip [Ropinirole Hcl] Anxiety  . Ropinirole Anxiety    Mood swing    Past Medical History, Surgical history, Social history, and Family History were reviewed and updated.  Review of Systems: Review of Systems  Constitutional: Positive for malaise/fatigue.  HENT: Negative.   Eyes: Negative.   Respiratory: Positive for cough.     Cardiovascular: Negative.   Gastrointestinal: Negative.   Genitourinary: Negative.   Musculoskeletal: Negative.   Skin: Negative.   Neurological: Negative.   Endo/Heme/Allergies: Negative.   Psychiatric/Behavioral: Negative.      Physical Exam:  vitals were not taken for this visit.   Wt Readings from Last 3 Encounters:  07/11/17 220 lb 3.2 oz (99.9 kg)  05/08/17 221 lb (100.2 kg)  03/28/17 216 lb 6.4 oz (98.2 kg)     Physical Exam  Constitutional: He is oriented to person, place, and time.  HENT:  Head: Normocephalic and atraumatic.  Mouth/Throat: Oropharynx is clear and moist.  Eyes: EOM are normal. Pupils are equal, round, and reactive to light.  Neck: Normal range of motion.  Cardiovascular: Normal rate, regular rhythm and normal heart sounds.  Pulmonary/Chest: Effort normal and breath sounds normal.  Abdominal: Soft. Bowel sounds are normal.  Musculoskeletal: Normal range of motion. He exhibits no edema, tenderness or deformity.  Lymphadenopathy:    He has no cervical adenopathy.  Neurological: He is alert and oriented to person, place, and time.  Skin: Skin is warm and dry. No rash noted. No erythema.  Psychiatric: He has a normal mood and affect. His behavior is normal. Judgment and thought content normal.  Vitals reviewed.   Lab Results  Component Value Date   WBC 4.9 08/21/2017   HGB 11.1 (L) 07/11/2017   HCT 33.6 (L) 08/21/2017   MCV 107.7 (H) 08/21/2017   PLT 112 (L) 08/21/2017     Chemistry      Component Value Date/Time   NA 139 07/11/2017 1139   NA 139 05/08/2017 1123   NA 141 05/15/2016 1126   K 4.2 07/11/2017 1139   K 4.0 05/08/2017 1123   K 4.3 05/15/2016 1126   CL 104 07/11/2017 1139   CL 105 05/08/2017 1123   CO2 29 07/11/2017 1139   CO2 30 05/08/2017 1123   CO2 26 05/15/2016 1126   BUN 22 07/11/2017 1139   BUN 15 05/08/2017 1123   BUN 29.1 (H) 05/15/2016 1126   CREATININE 1.31 07/11/2017 1139   CREATININE 1.2 05/08/2017 1123    CREATININE 1.7 (H) 05/15/2016 1126   GLU 90 11/15/2014      Component Value Date/Time   CALCIUM 9.0 07/11/2017 1139   CALCIUM 9.1 05/08/2017 1123   CALCIUM 9.7 05/15/2016 1126   ALKPHOS 49 07/11/2017 1139   ALKPHOS 64 05/08/2017 1123   ALKPHOS 69 05/15/2016 1126   AST 18 07/11/2017 1139   AST 22 05/08/2017 1123   AST 23 05/15/2016 1126   ALT 11 07/11/2017 1139   ALT 19 05/08/2017 1123   ALT 22 05/15/2016 1126   BILITOT 0.9 07/11/2017 1139   BILITOT 1.00 05/08/2017 1123   BILITOT 0.84 05/15/2016 1126  Impression and Plan: Mr. Tullis is a 84 year old white male. He had a history of diffuse large cell non-Hodgkin's lymphoma. He was treated  with 6 cycles of R-CHOP. He got this treatment up in [place]. He completed this back in April 2014.   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 This could be a residual from his chemotherapy for his lymphoma. However, I would think that this is a little bit early to show up if it was from his chemotherapy.  Our goal right now is to try to help with his fatigue.  I am sending off a testosterone level on him.  We are checking his iron stores.  Again, I do not see an indication that we need to treat him.  I will plan to get him back in another 3 months.   [name], MD 2/27/201912:41 PM

## 2017-08-22 ENCOUNTER — Telehealth: Payer: Self-pay | Admitting: *Deleted

## 2017-08-22 LAB — TESTOSTERONE: Testosterone: 364 ng/dL (ref 264–916)

## 2017-08-22 NOTE — Telephone Encounter (Addendum)
Message left on personal voice mail on cell phone  ----- Message from [name], MD sent at 08/22/2017  6:00 AM EST ----- Call - testosterone level is ok!!  pete

## 2017-09-03 ENCOUNTER — Telehealth: Payer: Self-pay | Admitting: Family Medicine

## 2017-09-03 DIAGNOSIS — G894 Chronic pain syndrome: Secondary | ICD-10-CM

## 2017-09-03 MED ORDER — HYDROCODONE-ACETAMINOPHEN 7.5-325 MG PO TABS: 1.0000 | ORAL_TABLET | Freq: Four times a day (QID) | ORAL | 0 refills | Status: DC | PRN

## 2017-09-03 NOTE — Telephone Encounter (Signed)
Request for hydrocodone.

## 2017-09-12 ENCOUNTER — Other Ambulatory Visit: Payer: Self-pay

## 2017-09-12 ENCOUNTER — Encounter (HOSPITAL_BASED_OUTPATIENT_CLINIC_OR_DEPARTMENT_OTHER): Payer: Self-pay | Admitting: *Deleted

## 2017-09-12 ENCOUNTER — Telehealth: Payer: Self-pay | Admitting: Family Medicine

## 2017-09-12 ENCOUNTER — Emergency Department (HOSPITAL_BASED_OUTPATIENT_CLINIC_OR_DEPARTMENT_OTHER)
Admission: EM | Admit: 2017-09-12 | Discharge: 2017-09-12 | Disposition: A | Discharge status: Home / Self Care | Payer: Medicare Other | Attending: Emergency Medicine | Admitting: Emergency Medicine

## 2017-09-12 DIAGNOSIS — N183 Chronic kidney disease, stage 3 (moderate): Secondary | ICD-10-CM | POA: Diagnosis not present

## 2017-09-12 DIAGNOSIS — M79671 Pain in right foot: Secondary | ICD-10-CM | POA: Diagnosis not present

## 2017-09-12 DIAGNOSIS — Z79899 Other long term (current) drug therapy: Secondary | ICD-10-CM | POA: Insufficient documentation

## 2017-09-12 DIAGNOSIS — I129 Hypertensive chronic kidney disease with stage 1 through stage 4 chronic kidney disease, or unspecified chronic kidney disease: Secondary | ICD-10-CM | POA: Diagnosis not present

## 2017-09-12 DIAGNOSIS — M10071 Idiopathic gout, right ankle and foot: Secondary | ICD-10-CM | POA: Diagnosis not present

## 2017-09-12 DIAGNOSIS — Z87891 Personal history of nicotine dependence: Secondary | ICD-10-CM | POA: Diagnosis not present

## 2017-09-12 DIAGNOSIS — Z96643 Presence of artificial hip joint, bilateral: Secondary | ICD-10-CM | POA: Diagnosis not present

## 2017-09-12 DIAGNOSIS — M109 Gout, unspecified: Secondary | ICD-10-CM | POA: Diagnosis not present

## 2017-09-12 MED ORDER — PREDNISONE 20 MG PO TABS: ORAL_TABLET | ORAL | 0 refills | Status: DC

## 2017-09-12 MED ORDER — AMOXICILLIN 500 MG PO CAPS: ORAL_CAPSULE | ORAL | 0 refills | Status: DC

## 2017-09-12 MED FILL — predniSONE 20 MG TABS: 20 | 4 days supply | Qty: 8 | Fill #0

## 2017-09-12 NOTE — Telephone Encounter (Signed)
Pt requesting prescription for prednisone- states he is having gout flare. He is also needing prescription for amoxicillin for dental appt. Please advise.

## 2017-09-12 NOTE — ED Notes (Signed)
Patient was given a urinal .

## 2017-09-12 NOTE — Discharge Instructions (Addendum)
Take the prednisone as prescribed.  You can also take Norco as needed for pain.  Call Dr. Edilia Bo to be seen if not feeling better by next week.

## 2017-09-12 NOTE — ED Provider Notes (Signed)
[facility] EMERGENCY DEPARTMENT Provider Note   CSN: [id number] Arrival date & time: 09/12/17  1111     History   Chief Complaint Chief Complaint  Patient presents with  . Toe Pain    HPI Jeremiah Walker is a 84 y.o. male.  HPI Complains of right great toe pain at MTP joint which started this morning typical of gout he has had in the past.  He is been treated with prednisone with relief.  He took 20 mg of prednisone this morning without adequate relief.  No injury.  No other associated symptoms symptoms are worse when he tries to move his toe.  Improved with remaining still.  No other associated symptoms.  No fever no nausea or vomiting. Past Medical History:  Diagnosis Date  . Anemia   . Anemia due to antineoplastic chemotherapy 08/15/2015  . Anxiety   . Arthritis   . Cancer ([place])   . Cataract   . Chronic kidney disease (CKD), stage III (moderate) ([facility]) 02/09/2015   Controlled  . Combined fat and carbohydrate induced hyperlipemia 02/09/2015   Controlled  . Depression   . DLBCL (diffuse large B cell lymphoma) ([place]) 08/15/2015  . Essential (primary) hypertension 02/09/2015  . Gastro-esophageal reflux disease without esophagitis 02/09/2015   Controlled  . Neuromuscular disorder ([place])   . Tinnitus     Patient Active Problem List   Diagnosis Date Noted  . 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 Chronic pain syndrome 09/26/2015  . De Quervain's tenosynovitis, right 09/13/2015  . Anemia due to antineoplastic chemotherapy 08/15/2015  . DLBCL (diffuse large B cell lymphoma) ([place]) 08/15/2015  . Skin lesion of face 05/21/2015  . Peripheral neuropathy due to chemotherapy ([place]) 02/09/2015  . Benign prostatic hyperplasia with urinary obstruction 02/09/2015  . Bilateral hearing loss 02/09/2015  . Low back pain 02/09/2015  . Chronic kidney disease (CKD), stage III (moderate) ([place]) 02/09/2015  . Essential (primary) hypertension  02/09/2015  . Gastro-esophageal reflux disease without esophagitis 02/09/2015  . Anxiety, generalized 02/09/2015  . Cannot sleep 02/09/2015  . Combined fat and carbohydrate induced hyperlipemia 02/09/2015  CML currently not getting any treatment  Past Surgical History:  Procedure Laterality Date  . CATARACT EXTRACTION, BILATERAL    . COLONOSCOPY  May 2011  . CYSTOSCOPY    . TOTAL HIP ARTHROPLASTY  2003   Right   . TOTAL HIP ARTHROPLASTY  April, 2011   Left  . TRIGGER FINGER RELEASE  Nov 2013  . TURBINECTOMY  2006  . UPPER GI ENDOSCOPY  Nov 2015       Home Medications    Prior to Admission medications   Medication Sig Start Date End Date Taking? Authorizing Provider  allopurinol (ZYLOPRIM) 100 MG tablet Take 1 tablet (100 mg total) by mouth daily. 08/20/17   Copland, Gay Filler, MD  ALPRAZolam Duanne Moron) 0.5 MG tablet Take 1 tablet (0.5 mg total) by mouth 3 (three) times daily as needed for anxiety. 08/16/17   Copland, Gay Filler, MD  amoxicillin (AMOXIL) 500 MG capsule Take 4 caps (2gm) by mouth once prior to dental procedure 09/12/17   Copland, Gay Filler, MD  azelastine (ASTELIN) 0.1 % nasal spray Place 2 sprays into both nostrils 2 (two) times daily. Use in each nostril as directed 03/28/17   Copland, Gay Filler, MD  bethanechol (URECHOLINE) 25 MG tablet TAKE 1 TABLET BY MOUTH THREE TIMES DAILY 02/22/17   Copland, Gay Filler, MD  Calcium 500 MG tablet  Take 600 mg by mouth daily.    [provider]  Cholecalciferol (VITAMIN D3) 2000 UNITS capsule Take 2,000 Units by mouth daily.    [provider]  gabapentin (NEURONTIN) 300 MG capsule TAKE 3 CAPSULES BY MOUTH THREE TIMES DAILY, PLUS 2 CAPSULES EVERY DAY AS NEEDED 08/27/16   Copland, Gay Filler, MD  HYDROcodone-acetaminophen (NORCO) 7.5-325 MG tablet Take 1 tablet by mouth every 6 (six) hours as needed for moderate pain. 09/03/17   Copland, Gay Filler, MD  Multiple Vitamin (MULTIVITAMIN) tablet Take 1 tablet by mouth daily.     [provider]  niacin 500 MG tablet Take 500 mg by mouth at bedtime.    [provider]  Omega-3 Fatty Acids (OMEGA-3 FISH OIL PO) Take 2 each by mouth every morning. EPA/DHA 520/350    [provider]  polyethylene glycol (MIRALAX / GLYCOLAX) packet Take 17 g by mouth daily as needed.    [provider]  predniSONE (DELTASONE) 20 MG tablet 2 tabs po daily x 4 days 09/12/17   Orlie Dakin, MD  psyllium (METAMUCIL) 58.6 % powder Take 1 packet by mouth daily. 1 Teaspoon daily    [provider]  S-Adenosylmethionine (Deysi Soldo-E) 400 MG TABS Take 400 mg by mouth daily.    [provider]  vitamin C (ASCORBIC ACID) 500 MG tablet Take 500 mg by mouth daily.    [provider]  zinc gluconate 50 MG tablet Take 50 mg by mouth daily.    [provider]    Family History Family History  Problem Relation Age of Onset  . Anuerysm Mother 75       Deceased  . Colon cancer Father 20       Deceased  . Colon cancer Brother   . Prostate cancer Brother        Deceased  . Alzheimer's disease Brother   . Heart disease Brother   . Alzheimer's disease Paternal Grandmother   . Early death Maternal Grandmother        Unknown  . Obesity Son   . Obesity Son        Gastric Bypass  . Healthy Son   . Healthy Daughter   . Diabetes Neg Hx     Social History Social History   Tobacco Use  . Smoking status: Former Research scientist (life sciences)  . Smokeless tobacco: Never Used  . Tobacco comment: Quit >50 years ago  Substance Use Topics  . Alcohol use: Yes    Alcohol/week: 0.6 - 2.4 oz    Types: 1 - 4 Shots of liquor per week  . Drug use: No     Allergies   Nsaids; Tolmetin; Ciprofloxacin; Statins; Requip [ropinirole hcl]; and Ropinirole   Review of Systems Review of Systems  Musculoskeletal: Positive for gait problem.       Walks with walker  Allergic/Immunologic: Positive for immunocompromised state.       CML  All other systems reviewed and  are negative.    Physical Exam Updated Vital Signs BP 134/66 (BP Location: Left Arm)   Pulse 78   Temp 99.1 F (37.3 C) (Oral)   Resp 20   Ht 5' 9.5" (1.765 m)   Wt 98.9 kg (218 lb)   SpO2 98%   BMI 31.73 kg/m   Physical Exam  Constitutional: He appears well-developed and well-nourished.  HENT:  Head: Normocephalic and atraumatic.  Eyes: EOM are normal.  Neck: Neck supple.  Cardiovascular: Normal rate.  Pulmonary/Chest: Effort normal.  Abdominal: He exhibits distension.  Musculoskeletal: Normal range of motion.  Lower extremity tender at the MTP joint of the great toe with minimal redness.  No swelling.  Good capillary refill.  DP pulse 2+ all other extremities without redness swelling or tenderness neurovascular intact     ED Treatments / Results  Labs (all labs ordered are listed, but only abnormal results are displayed) Labs Reviewed - No data to display  EKG  EKG Interpretation None       Radiology No results found.  Procedures Procedures (including critical care time)  Medications Ordered in ED Medications - No data to display   Initial Impression / Assessment and Plan / ED Course  I have reviewed the triage vital signs and the nursing notes.  Pertinent labs & imaging results that were available during my care of the patient were reviewed by me and considered in my medical decision making (see chart for details).     A lengthy discussion with patient about placing him on prednisone with his immune compromised state.  He reports that he has taken prednisone in the past multiple times without complication.  He can also take Norco which he has a supply of at home for pain plan follow-up with PMD if not feeling better by next week  Final Clinical Impressions(s) / ED Diagnoses  Diagnosis gouty arthropathy Final diagnoses:  None    ED Discharge Orders        Ordered    predniSONE (DELTASONE) 20 MG tablet     09/12/17 1324       Orlie Dakin, MD 09/12/17 1333

## 2017-09-12 NOTE — ED Notes (Signed)
Ice pack placed to right great toe per patient request.

## 2017-09-12 NOTE — Progress Notes (Signed)
Subjective:   Jeremiah Walker is a 84 y.o. male who presents for Medicare Annual/Subsequent preventive examination.  Review of Systems: No ROS.  Medicare Wellness Visit. Additional risk factors are reflected in the social history.  Cardiac Risk Factors include: advanced age (>76men, >73 women);dyslipidemia;hypertension;sedentary lifestyle;obesity (BMI >30kg/m2) Sleep patterns: Sleep starts in recline. Wakes to urinate. Finishes in bed.  Home Safety/Smoke Alarms: Feels safe in home. Smoke alarms in place.  Living environment; residence and Adult nurse: Lives at [facility] Seat Belt Safety/Bike Helmet: Wears seat belt.   Male:   CCS- last reported 10/23/09 as normal    PSA- No results found for: PSA  Dentist every 6 months Dr.Beshears.     Objective:    Vitals: BP 128/60 (BP Location: Left Arm, Patient Position: Sitting, Cuff Size: Normal)   Pulse 65   Ht 5\' 9"  (1.753 m)   Wt 219 lb 3.2 oz (99.4 kg)   SpO2 96%   BMI 32.37 kg/m   Body mass index is 32.37 kg/m.  Advanced Directives 09/20/2017 09/12/2017 05/08/2017 02/06/2017 12/04/2016 11/30/2016 11/22/2016  Does Patient Have a Medical Advance Directive? Yes No Yes Yes No No Yes  Type of Paramedic of [place];Living will - [place];Living will [place];Living will - Healthcare Power of [place]  Does patient want to make changes to medical advance directive? No - Patient declined - - - - - -  Copy of [place] in Chart? No - copy requested - - - - No - copy requested -  Would patient like information on creating a medical advance directive? - - - - - No - Patient declined No - Patient declined    Tobacco Social History   Tobacco Use  Smoking Status Former Smoker  Smokeless Tobacco Never Used  Tobacco Comment   Quit >50 years ago     Counseling given: Not Answered Comment: Quit >50 years ago   Clinical  Intake: Pain Score: 0-No pain    Past Medical History:  Diagnosis Date  . Anemia   . Anemia due to antineoplastic chemotherapy 08/15/2015  . Anxiety   . Arthritis   . Cancer ([place])   . Cataract   . Chronic kidney disease (CKD), stage III (moderate) (HCC) 02/09/2015   Controlled  . CMML (chronic myelomonocytic leukemia) ([place])   . Combined fat and carbohydrate induced hyperlipemia 02/09/2015   Controlled  . Depression   . DLBCL (diffuse large B cell lymphoma) ([place]) 08/15/2015  . Essential (primary) hypertension 02/09/2015  . Gastro-esophageal reflux disease without esophagitis 02/09/2015   Controlled  . Neuromuscular disorder ([place])   . Tinnitus    Past Surgical History:  Procedure Laterality Date  . CATARACT EXTRACTION, BILATERAL    . COLONOSCOPY  May 2011  . CYSTOSCOPY    . TOTAL HIP ARTHROPLASTY  2003   Right   . TOTAL HIP ARTHROPLASTY  April, 2011   Left  . TRIGGER FINGER RELEASE  Nov 2013  . TURBINECTOMY  2006  . UPPER GI ENDOSCOPY  Nov 2015   Family History  Problem Relation Age of Onset  . Anuerysm Mother 64       Deceased  . Colon cancer Father 73       Deceased  . Colon cancer Brother   . Prostate cancer Brother        Deceased  . Alzheimer's disease Brother   . Heart disease Brother   .  Alzheimer's disease Paternal Grandmother   . Early death Maternal Grandmother        Unknown  . Obesity Son   . Obesity Son        Gastric Bypass  . Healthy Son   . Healthy Daughter   . Diabetes Neg Hx    Social History   Socioeconomic History  . Marital status: Widowed    Spouse name: Not on file  . Number of children: Not on file  . Years of education: Not on file  . Highest education level: Not on file  Occupational History  . Not on file  Social Needs  . Financial resource strain: Not on file  . Food insecurity:    Worry: Not on file    Inability: Not on file  . Transportation needs:    Medical: Not on file    Non-medical: Not on file  Tobacco Use  .  Smoking status: Former Research scientist (life sciences)  . Smokeless tobacco: Never Used  . Tobacco comment: Quit >50 years ago  Substance and Sexual Activity  . Alcohol use: Yes    Alcohol/week: 0.6 - 2.4 oz    Types: 1 - 4 Shots of liquor per week    Comment: 4 glasses per week  . Drug use: No  . Sexual activity: Not on file  Lifestyle  . Physical activity:    Days per week: Not on file    Minutes per session: Not on file  . Stress: Not on file  Relationships  . Social connections:    Talks on phone: Not on file    Gets together: Not on file    Attends religious service: Not on file    Active member of club or organization: Not on file    Attends meetings of clubs or organizations: Not on file    Relationship status: Not on file  Other Topics Concern  . Not on file  Social History Narrative  . Not on file    Outpatient Encounter Medications as of 09/20/2017  Medication Sig  . allopurinol (ZYLOPRIM) 100 MG tablet Take 1 tablet (100 mg total) by mouth daily.  Marland Kitchen ALPRAZolam (XANAX) 0.5 MG tablet Take 1 tablet (0.5 mg total) by mouth 3 (three) times daily as needed for anxiety.  Marland Kitchen amoxicillin (AMOXIL) 500 MG capsule Take 4 caps (2gm) by mouth once prior to dental procedure  . azelastine (ASTELIN) 0.1 % nasal spray Place 2 sprays into both nostrils 2 (two) times daily. Use in each nostril as directed  . Calcium 500 MG tablet Take 600 mg by mouth daily.  . Cholecalciferol (VITAMIN D3) 2000 UNITS capsule Take 2,000 Units by mouth daily.  Marland Kitchen gabapentin (NEURONTIN) 300 MG capsule TAKE 3 CAPSULES BY MOUTH THREE TIMES DAILY, PLUS 2 CAPSULES EVERY DAY AS NEEDED  . HYDROcodone-acetaminophen (NORCO) 7.5-325 MG tablet Take 1 tablet by mouth every 6 (six) hours as needed for moderate pain.  . Multiple Vitamin (MULTIVITAMIN) tablet Take 1 tablet by mouth daily.  . Omega-3 Fatty Acids (OMEGA-3 FISH OIL PO) Take 2 each by mouth every morning. EPA/DHA 520/350  . polyethylene glycol (MIRALAX / GLYCOLAX) packet Take 17 g by  mouth daily as needed.  . predniSONE (DELTASONE) 20 MG tablet 2 tabs po daily x 4 days  . psyllium (METAMUCIL) 58.6 % powder Take 1 packet by mouth daily. 1 Teaspoon daily  . S-Adenosylmethionine (SAM-E) 400 MG TABS Take 400 mg by mouth daily.  . vitamin C (ASCORBIC ACID) 500 MG tablet  Take 500 mg by mouth daily.  Marland Kitchen zinc gluconate 50 MG tablet Take 50 mg by mouth daily.  . [DISCONTINUED] bethanechol (URECHOLINE) 25 MG tablet TAKE 1 TABLET BY MOUTH THREE TIMES DAILY  . niacin 500 MG tablet Take 500 mg by mouth at bedtime.  . [DISCONTINUED] amoxicillin (AMOXIL) 500 MG capsule Take 4 caps (2gm) by mouth once prior to dental procedure (Patient not taking: Reported on 08/21/2017)  . [DISCONTINUED] predniSONE (DELTASONE) 20 MG tablet Take 1-2 pills by mouth daily for 3 days.  May repeat as needed for gout flare   No facility-administered encounter medications on file as of 09/20/2017.     Activities of Daily Living In your present state of health, do you have any difficulty performing the following activities: 09/20/2017 11/22/2016  Hearing? N N  Comment bilateral hearing aids -  Vision? N N  Comment Wearing trifocals.  -  Difficulty concentrating or making decisions? N N  Walking or climbing stairs? N N  Comment uses cane -  Dressing or bathing? N N  Doing errands, shopping? N -  Preparing Food and eating ? N -  Using the Toilet? N -  In the past six months, have you accidently leaked urine? N -  Do you have problems with loss of bowel control? N -  Managing your Medications? N -  Managing your Finances? N -  Housekeeping or managing your Housekeeping? N -  Some recent data might be hidden    Patient Care Team: Copland, Gay Filler, MD as PCP - General (Family Medicine) [name], MD as Consulting Physician (Oncology) Garald Balding, MD as Consulting Physician (Orthopedic Surgery) Alfonse Ras May, MD as Consulting Physician (Audiology) Deterding, Jeneen Rinks, MD as Consulting  Physician (Nephrology) Sheryn Bison, MD as Consulting Physician (Dermatology) Beshears, Dorie Rank, DMD as Consulting Physician (Dentistry) Magnus Sinning, MD as Consulting Physician (Physical Medicine and Rehabilitation) Garrel Ridgel, DPM as Consulting Physician (Podiatry)   Assessment:   This is a routine wellness examination for [place]. Physical assessment deferred to PCP.  Exercise Activities and Dietary recommendations Current Exercise Habits: Structured exercise class, Type of exercise: walking;stretching;strength training/weights, Time (Minutes): 60, Frequency (Times/Week): 3, Weekly Exercise (Minutes/Week): 180, Intensity: Moderate Diet (meal preparation, eat out, water intake, caffeinated beverages, dairy products, fruits and vegetables): well balanced, on average, 3 meals per day   Goals    None      Fall Risk Fall Risk  09/20/2017 06/04/2016 05/30/2016 05/15/2016 04/06/2016  Falls in the past year? No No No No No    Depression Screen PHQ 2/9 Scores 09/20/2017 06/04/2016 05/30/2016 05/21/2015  PHQ - 2 Score 0 0 0 0    Cognitive Function Ad8 score reviewed for issues:  Issues making decisions:no  Less interest in hobbies / activities:no  Repeats questions, stories (family complaining):no  Trouble using ordinary gadgets (microwave, computer, phone):no  Forgets the month or year: no  Mismanaging finances: no  Remembering appts:no  Daily problems with thinking and/or memory:no Ad8 score is=0  MMSE - Mini Mental State Exam 06/04/2016  Orientation to time 5  Orientation to Place 5  Registration 3  Attention/ Calculation 4  Recall 1  Language- name 2 objects 2  Language- repeat 1  Language- follow 3 step command 3  Language- read & follow direction 1  Write a sentence 1  Copy design 1  Total score 27        Immunization History  Administered Date(s) Administered  . Influenza-Unspecified 04/04/2015, 03/15/2016  .  Pneumococcal Conjugate-13  01/05/2014  . Pneumococcal Polysaccharide-23 12/23/2005, 06/25/2008  . Tdap 10/24/2010, 04/26/2011  . Zoster 07/14/2006, 06/25/2008    Screening Tests Health Maintenance  Topic Date Due  . TETANUS/TDAP  04/25/2021  . INFLUENZA VACCINE  Completed  . PNA vac Low Risk Adult  Completed   + Plan:   Please schedule appointment to follow up with Dr.Copland.  Continue to eat heart healthy diet (full of fruits, vegetables, whole grains, lean protein, water--limit salt, fat, and sugar intake) and increase physical activity as tolerated.  Continue doing brain stimulating activities (puzzles, reading, adult coloring books, staying active) to keep memory sharp.    I have personally reviewed and noted the following in the patient's chart:   . Medical and social history . Use of alcohol, tobacco or illicit drugs  . Current medications and supplements . Functional ability and status . Nutritional status . Physical activity . Advanced directives . List of other physicians . Hospitalizations, surgeries, and ER visits in previous 12 months . Vitals . Screenings to include cognitive, depression, and falls . Referrals and appointments  In addition, I have reviewed and discussed with patient certain preventive protocols, quality metrics, and best practice recommendations. A written personalized care plan for preventive services as well as general preventive health recommendations were provided to patient.     Naaman Plummer [place], [place]  09/20/2017

## 2017-09-12 NOTE — ED Triage Notes (Signed)
Right great toe pain on and off for a year. States he knows it is gout.

## 2017-09-12 NOTE — Telephone Encounter (Signed)
done

## 2017-09-16 DIAGNOSIS — D696 Thrombocytopenia, unspecified: Secondary | ICD-10-CM | POA: Diagnosis not present

## 2017-09-16 DIAGNOSIS — I129 Hypertensive chronic kidney disease with stage 1 through stage 4 chronic kidney disease, or unspecified chronic kidney disease: Secondary | ICD-10-CM | POA: Diagnosis not present

## 2017-09-16 DIAGNOSIS — N2581 Secondary hyperparathyroidism of renal origin: Secondary | ICD-10-CM | POA: Diagnosis not present

## 2017-09-16 DIAGNOSIS — M1990 Unspecified osteoarthritis, unspecified site: Secondary | ICD-10-CM | POA: Diagnosis not present

## 2017-09-16 DIAGNOSIS — E785 Hyperlipidemia, unspecified: Secondary | ICD-10-CM | POA: Diagnosis not present

## 2017-09-16 DIAGNOSIS — Z6833 Body mass index (BMI) 33.0-33.9, adult: Secondary | ICD-10-CM | POA: Diagnosis not present

## 2017-09-16 DIAGNOSIS — Z0000 Encounter for general adult medical examination without abnormal findings: Secondary | ICD-10-CM | POA: Diagnosis not present

## 2017-09-16 DIAGNOSIS — C8330 Diffuse large B-cell lymphoma, unspecified site: Secondary | ICD-10-CM | POA: Diagnosis not present

## 2017-09-16 DIAGNOSIS — N183 Chronic kidney disease, stage 3 (moderate): Secondary | ICD-10-CM | POA: Diagnosis not present

## 2017-09-16 DIAGNOSIS — C9310 Chronic myelomonocytic leukemia not having achieved remission: Secondary | ICD-10-CM | POA: Diagnosis not present

## 2017-09-16 DIAGNOSIS — D631 Anemia in chronic kidney disease: Secondary | ICD-10-CM | POA: Diagnosis not present

## 2017-09-16 DIAGNOSIS — G629 Polyneuropathy, unspecified: Secondary | ICD-10-CM | POA: Diagnosis not present

## 2017-09-20 ENCOUNTER — Ambulatory Visit (INDEPENDENT_AMBULATORY_CARE_PROVIDER_SITE_OTHER): Payer: Medicare Other | Admitting: *Deleted

## 2017-09-20 ENCOUNTER — Encounter: Payer: Self-pay | Admitting: *Deleted

## 2017-09-20 VITALS — BP 128/60 | HR 65 | Ht 69.0 in | Wt 219.2 lb

## 2017-09-20 DIAGNOSIS — Z0000 Encounter for general adult medical examination without abnormal findings: Secondary | ICD-10-CM

## 2017-09-20 NOTE — Patient Instructions (Signed)
Please schedule appointment to follow up with Dr.Copland.  Continue to eat heart healthy diet (full of fruits, vegetables, whole grains, lean protein, water--limit salt, fat, and sugar intake) and increase physical activity as tolerated.  Continue doing brain stimulating activities (puzzles, reading, adult coloring books, staying active) to keep memory sharp.    Jeremiah Walker , Thank you for taking time to come for your Medicare Wellness Visit. I appreciate your ongoing commitment to your health goals. Please review the following plan we discussed and let me know if I can assist you in the future.   These are the goals we discussed: Goals    . Maintain healthy active lifestyle.       This is a list of the screening recommended for you and due dates:  Health Maintenance  Topic Date Due  . Tetanus Vaccine  04/25/2021  . Flu Shot  Completed  . Pneumonia vaccines  Completed     Health Maintenance, Male A healthy lifestyle and preventive care is important for your health and wellness. Ask your health care provider about what schedule of regular examinations is right for you. What should I know about weight and diet? Eat a Healthy Diet  Eat plenty of vegetables, fruits, whole grains, low-fat dairy products, and lean protein.  Do not eat a lot of foods high in solid fats, added sugars, or salt.  Maintain a Healthy Weight Regular exercise can help you achieve or maintain a healthy weight. You should:  Do at least 150 minutes of exercise each week. The exercise should increase your heart rate and make you sweat (moderate-intensity exercise).  Do strength-training exercises at least twice a week.  Watch Your Levels of Cholesterol and Blood Lipids  Have your blood tested for lipids and cholesterol every 5 years starting at 82 years of age. If you are at high risk for heart disease, you should start having your blood tested when you are 82 years old. You may need to have your cholesterol  levels checked more often if: ? Your lipid or cholesterol levels are high. ? You are older than 82 years of age. ? You are at high risk for heart disease.  What should I know about cancer screening? Many types of cancers can be detected early and may often be prevented. Lung Cancer  You should be screened every year for lung cancer if: ? You are a current smoker who has smoked for at least 30 years. ? You are a former smoker who has quit within the past 15 years.  Talk to your health care provider about your screening options, when you should start screening, and how often you should be screened.  Colorectal Cancer  Routine colorectal cancer screening usually begins at 82 years of age and should be repeated every 5-10 years until you are 82 years old. You may need to be screened more often if early forms of precancerous polyps or small growths are found. Your health care provider may recommend screening at an earlier age if you have risk factors for colon cancer.  Your health care provider may recommend using home test kits to check for hidden blood in the stool.  A small camera at the end of a tube can be used to examine your colon (sigmoidoscopy or colonoscopy). This checks for the earliest forms of colorectal cancer.  Prostate and Testicular Cancer  Depending on your age and overall health, your health care provider may do certain tests to screen for prostate and testicular  cancer.  Talk to your health care provider about any symptoms or concerns you have about testicular or prostate cancer.  Skin Cancer  Check your skin from head to toe regularly.  Tell your health care provider about any new moles or changes in moles, especially if: ? There is a change in a mole's size, shape, or color. ? You have a mole that is larger than a pencil eraser.  Always use sunscreen. Apply sunscreen liberally and repeat throughout the day.  Protect yourself by wearing long sleeves, pants, a  wide-brimmed hat, and sunglasses when outside.  What should I know about heart disease, diabetes, and high blood pressure?  If you are 52-89 years of age, have your blood pressure checked every 3-5 years. If you are 41 years of age or older, have your blood pressure checked every year. You should have your blood pressure measured twice-once when you are at a hospital or clinic, and once when you are not at a hospital or clinic. Record the average of the two measurements. To check your blood pressure when you are not at a hospital or clinic, you can use: ? An automated blood pressure machine at a pharmacy. ? A home blood pressure monitor.  Talk to your health care provider about your target blood pressure.  If you are between 91-23 years old, ask your health care provider if you should take aspirin to prevent heart disease.  Have regular diabetes screenings by checking your fasting blood sugar level. ? If you are at a normal weight and have a low risk for diabetes, have this test once every three years after the age of 65. ? If you are overweight and have a high risk for diabetes, consider being tested at a younger age or more often.  A one-time screening for abdominal aortic aneurysm (AAA) by ultrasound is recommended for men aged 37-75 years who are current or former smokers. What should I know about preventing infection? Hepatitis B If you have a higher risk for hepatitis B, you should be screened for this virus. Talk with your health care provider to find out if you are at risk for hepatitis B infection. Hepatitis C Blood testing is recommended for:  Everyone born from [birth year] through 1963.  Anyone with known risk factors for hepatitis C.  Sexually Transmitted Diseases (STDs)  You should be screened each year for STDs including gonorrhea and chlamydia if: ? You are sexually active and are younger than 82 years of age. ? You are older than 82 years of age and your health care provider  tells you that you are at risk for this type of infection. ? Your sexual activity has changed since you were last screened and you are at an increased risk for chlamydia or gonorrhea. Ask your health care provider if you are at risk.  Talk with your health care provider about whether you are at high risk of being infected with HIV. Your health care provider may recommend a prescription medicine to help prevent HIV infection.  What else can I do?  Schedule regular health, dental, and eye exams.  Stay current with your vaccines (immunizations).  Do not use any tobacco products, such as cigarettes, chewing tobacco, and e-cigarettes. If you need help quitting, ask your health care provider.  Limit alcohol intake to no more than 2 drinks per day. One drink equals 12 ounces of beer, 5 ounces of wine, or 1 ounces of hard liquor.  Do not use street drugs.  Do not share needles.  Ask your health care provider for help if you need support or information about quitting drugs.  Tell your health care provider if you often feel depressed.  Tell your health care provider if you have ever been abused or do not feel safe at home. This information is not intended to replace advice given to you by your health care provider. Make sure you discuss any questions you have with your health care provider. Document Released: 12/08/2007 Document Revised: 02/08/2016 Document Reviewed: 03/15/2015 Elsevier Interactive Patient Education  [employer].

## 2017-09-20 NOTE — Progress Notes (Signed)
Noted. Agree with above.  [place], DO 09/20/17 3:33 PM

## 2017-09-24 NOTE — Progress Notes (Signed)
[place] at [facility] [address], [place], [place] [address] [phone]  Date:  09/25/2017   Name:  Jeremiah Walker   DOB:  [birth year]   MRN:  [id number]  PCP:  [name], MD    Chief Complaint: Shoulder Pain (limited ROM, right shoulder, arthirits for years) and Leg Issue (left leg becoming shorter, pain in muscle on upper left side due to leg)   History of Present Illness:  Jeremiah Walker is a 84 y.o. very pleasant male patient who presents with the following:  History of CLL, CKD Here today with concern of shoulder pain- his right shoulder is the problem  He has noted snapping and popping in his shoulder for a long time He cannot recall any particular injury He did pitch baseball in his youth however His shoulder has seemed worse for the last couple of months, but has bothered him some for years He is still using his hydraulic weight training machines at the living community where he resides and is able to do all his normal upper body exercises  He notes that about 10 years ago he was told that his left leg was 1/4" shorter, so he started using a small lift in his left shoe.  However about 2 months ago he notes that he needed to add another 1/4"to his left side in order to be even.  He wonders why his leg seems to be getting shorter.  No hip pain.  He does also have significant scoliosis  He also needs a refill of his xanax- I think he can just refill this at the pharmacy.  Confirmed refill available with the pharmacy, they will prepare for him   Patient Active Problem List   Diagnosis Date Noted  . 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 Chronic pain syndrome 09/26/2015  . De Quervain's tenosynovitis, right 09/13/2015  . Anemia due to antineoplastic chemotherapy 08/15/2015  . DLBCL (diffuse large B cell lymphoma) ([place]) 08/15/2015  . Skin lesion of face 05/21/2015  .  Peripheral neuropathy due to chemotherapy ([place]) 02/09/2015  . Benign prostatic hyperplasia with urinary obstruction 02/09/2015  . Bilateral hearing loss 02/09/2015  . Low back pain 02/09/2015  . Chronic kidney disease (CKD), stage III (moderate) ([place]) 02/09/2015  . Essential (primary) hypertension 02/09/2015  . Gastro-esophageal reflux disease without esophagitis 02/09/2015  . Anxiety, generalized 02/09/2015  . Cannot sleep 02/09/2015  . Combined fat and carbohydrate induced hyperlipemia 02/09/2015    Past Medical History:  Diagnosis Date  . Anemia   . Anemia due to antineoplastic chemotherapy 08/15/2015  . Anxiety   . Arthritis   . Cancer ([place])   . Cataract   . Chronic kidney disease (CKD), stage III (moderate) ([facility]) 02/09/2015   Controlled  . CMML (chronic myelomonocytic leukemia) ([place])   . Combined fat and carbohydrate induced hyperlipemia 02/09/2015   Controlled  . Depression   . DLBCL (diffuse large B cell lymphoma) ([place]) 08/15/2015  . Essential (primary) hypertension 02/09/2015  . Gastro-esophageal reflux disease without esophagitis 02/09/2015   Controlled  . Neuromuscular disorder ([place])   . Tinnitus     Past Surgical History:  Procedure Laterality Date  . CATARACT EXTRACTION, BILATERAL    . COLONOSCOPY  May 2011  . CYSTOSCOPY    . TOTAL HIP ARTHROPLASTY  2003   Right   . TOTAL HIP ARTHROPLASTY  April, 2011   Left  .  TRIGGER FINGER RELEASE  Nov 2013  . TURBINECTOMY  2006  . UPPER GI ENDOSCOPY  Nov 2015    Social History   Tobacco Use  . Smoking status: Former Research scientist (life sciences)  . Smokeless tobacco: Never Used  . Tobacco comment: Quit >50 years ago  Substance Use Topics  . Alcohol use: Yes    Alcohol/week: 0.6 - 2.4 oz    Types: 1 - 4 Shots of liquor per week    Comment: 4 glasses per week  . Drug use: No    Family History  Problem Relation Age of Onset  . Anuerysm Mother 67       Deceased  . Colon cancer Father 64       Deceased  . Colon cancer Brother   .  Prostate cancer Brother        Deceased  . Alzheimer's disease Brother   . Heart disease Brother   . Alzheimer's disease Paternal Grandmother   . Early death Maternal Grandmother        Unknown  . Obesity Son   . Obesity Son        Gastric Bypass  . Healthy Son   . Healthy Daughter   . Diabetes Neg Hx     Allergies  Allergen Reactions  . Nsaids Other (See Comments) and Tinitus    Bruising   . Tolmetin Other (See Comments)    Bruising  . Ciprofloxacin Other (See Comments)    Other reaction(s): Other (See Comments) Achilles tendon Achilles tendon  . Statins Nausea And Vomiting and Other (See Comments)    Other reaction(s): Kidney issues Muscle pain Muscle pain Other reaction(s): Kidney issues Muscle pain  . Requip [Ropinirole Hcl] Anxiety  . Ropinirole Anxiety    Mood swing    Medication list has been reviewed and updated.  Current Outpatient Medications on File Prior to Visit  Medication Sig Dispense Refill  . allopurinol (ZYLOPRIM) 100 MG tablet Take 1 tablet (100 mg total) by mouth daily. 30 tablet 1  . ALPRAZolam (XANAX) 0.5 MG tablet Take 1 tablet (0.5 mg total) by mouth 3 (three) times daily as needed for anxiety. 90 tablet 2  . amoxicillin (AMOXIL) 500 MG capsule Take 4 caps (2gm) by mouth once prior to dental procedure 20 capsule 0  . azelastine (ASTELIN) 0.1 % nasal spray Place 2 sprays into both nostrils 2 (two) times daily. Use in each nostril as directed 30 mL 6  . Calcium 500 MG tablet Take 600 mg by mouth daily.    . Cholecalciferol (VITAMIN D3) 2000 UNITS capsule Take 2,000 Units by mouth daily.    Marland Kitchen gabapentin (NEURONTIN) 300 MG capsule TAKE 3 CAPSULES BY MOUTH THREE TIMES DAILY, PLUS 2 CAPSULES EVERY DAY AS NEEDED 990 capsule 0  . HYDROcodone-acetaminophen (NORCO) 7.5-325 MG tablet Take 1 tablet by mouth every 6 (six) hours as needed for moderate pain. 100 tablet 0  . Multiple Vitamin (MULTIVITAMIN) tablet Take 1 tablet by mouth daily.    . niacin 500  MG tablet Take 500 mg by mouth at bedtime.    . Omega-3 Fatty Acids (OMEGA-3 FISH OIL PO) Take 2 each by mouth every morning. EPA/DHA 520/350    . polyethylene glycol (MIRALAX / GLYCOLAX) packet Take 17 g by mouth daily as needed.    . predniSONE (DELTASONE) 20 MG tablet 2 tabs po daily x 4 days 8 tablet 0  . psyllium (METAMUCIL) 58.6 % powder Take 1 packet by mouth daily. 1 Teaspoon daily    .  S-Adenosylmethionine (SAM-E) 400 MG TABS Take 400 mg by mouth daily.    . vitamin C (ASCORBIC ACID) 500 MG tablet Take 500 mg by mouth daily.    Marland Kitchen zinc gluconate 50 MG tablet Take 50 mg by mouth daily.     No current facility-administered medications on file prior to visit.     Review of Systems:  As per HPI- otherwise negative. No fever or chills   Physical Examination: Vitals:   09/25/17 1503  BP: 138/78  Pulse: 85  Resp: 16  SpO2: 97%   Vitals:   09/25/17 1503  Weight: 222 lb 3.2 oz (100.8 kg)  Height: 5\' 9"  (1.753 m)   Body mass index is 32.81 kg/m. Ideal Body Weight: Weight in (lb) to have BMI = 25: 168.9  GEN: WDWN, NAD, Non-toxic, A & O x 3, central obesity, looks well  HEENT: Atraumatic, Normocephalic. Neck supple. No masses, No LAD. Bilateral TM wnl, oropharynx normal.  PEERL,EOMI.   Ears and Nose: No external deformity. CV: RRR, No M/G/R. No JVD. No thrill. No extra heart sounds. PULM: CTA B, no wheezes, crackles, rhonchi. No retractions. No resp. distress. No accessory muscle use. EXTR: No c/c/e NEURO Normal gait.  PSYCH: Normally interactive. Conversant. Not depressed or anxious appearing.  Calm demeanor Right shoulder: he has full ROM and does not seem to have rotator cuff tendonitis Normal strength in all plains and good internal and external rotation He does have significant TL scoliosis which I think is likely the cause of his leg length discrepancy.  The leg length difference is not enough for me to appreciate visually however  Dg Chest 2 View  Result Date:  09/25/2017 CLINICAL DATA:  84 year old male with possible abnormal right hilum on right shoulder series today. EXAM: CHEST - 2 VIEW COMPARISON:  Right shoulder series 1601 hours today. Chest radiographs 11/05/2016 FINDINGS: Mediastinal contours are stable and within normal limits, including the right hilum. Calcified aortic atherosclerosis. Stable lung volumes. No pneumothorax, pulmonary edema, pleural effusion or consolidation. Mild increased interstitial markings in both lungs are stable. No acute pulmonary opacity. No acute osseous abnormality identified. Negative visible bowel gas pattern. IMPRESSION: 1. Stable and negative radiographic appearance of the right hilum and mediastinum. No acute cardiopulmonary abnormality. 2.  Aortic Atherosclerosis (ICD10-I70.0). Electronically Signed   By: Genevie Ann M.D.   On: 09/25/2017 16:24   Dg Shoulder Right  Result Date: 09/25/2017 CLINICAL DATA:  RIGHT shoulder pain and popping with movement for 2 months. EXAM: RIGHT SHOULDER - 2+ VIEW COMPARISON:  Chest radiograph September 05, 2017 FINDINGS: The humeral head is well-formed and located. Small corticated bony fragment superior to distal clavicle. The subacromial, glenohumeral and acromioclavicular joint spaces are intact. No destructive bony lesions. Soft tissue planes are non-suspicious. Asymmetrically prominent RIGHT mediastinal border. IMPRESSION: Small bone fragment superior to distal clavicle, possible loose body or old injury. No acute fracture deformity, dislocation or advanced degenerative change for age. Asymmetric fullness RIGHT hilum, possibly vascular shadow. Recommend chest radiograph. Electronically Signed   By: Elon Alas M.D.   On: 09/25/2017 15:49     Assessment and Plan: Chronic right shoulder pain - Plan: DG Shoulder Right, Ambulatory referral to Orthopedic Surgery  Chest x-ray abnormality - Plan: DG Chest 2 View  Leg length difference, acquired  Right shoulder pain- films show possible  loose body in shoulder.  Referred back to his orthopedist who he has seen in the past, Dr. Durward Fortes.   Also got chest film to follow-up  finding on shoulder films- negative as above, pt alerted  He has added a bit more lift to his left shoe is now walking again evenly.  As he has no hip pain or sign of acute fracture in his back or pelvic girdle will continue to monitor this.  Suspect due to age related degenerative change of his spine in combination with his scoliosis   Signed Lamar Blinks, MD

## 2017-09-25 ENCOUNTER — Encounter: Payer: Self-pay | Admitting: Family Medicine

## 2017-09-25 ENCOUNTER — Ambulatory Visit (HOSPITAL_BASED_OUTPATIENT_CLINIC_OR_DEPARTMENT_OTHER)
Admission: RE | Admit: 2017-09-25 | Discharge: 2017-09-25 | Disposition: A | Discharge status: Home / Self Care | Payer: Medicare Other | Source: Ambulatory Visit | Attending: Family Medicine | Admitting: Family Medicine

## 2017-09-25 ENCOUNTER — Ambulatory Visit (INDEPENDENT_AMBULATORY_CARE_PROVIDER_SITE_OTHER): Payer: Medicare Other | Admitting: Family Medicine

## 2017-09-25 ENCOUNTER — Ambulatory Visit: Payer: Medicare Other | Admitting: Family Medicine

## 2017-09-25 VITALS — BP 138/78 | HR 85 | Resp 16 | Ht 69.0 in | Wt 222.2 lb

## 2017-09-25 DIAGNOSIS — I700 Atherosclerosis of aorta: Secondary | ICD-10-CM | POA: Diagnosis not present

## 2017-09-25 DIAGNOSIS — M2170 Unequal limb length (acquired), unspecified site: Secondary | ICD-10-CM | POA: Diagnosis not present

## 2017-09-25 DIAGNOSIS — M25511 Pain in right shoulder: Secondary | ICD-10-CM | POA: Diagnosis not present

## 2017-09-25 DIAGNOSIS — G8929 Other chronic pain: Secondary | ICD-10-CM | POA: Diagnosis not present

## 2017-09-25 DIAGNOSIS — R9389 Abnormal findings on diagnostic imaging of other specified body structures: Secondary | ICD-10-CM

## 2017-09-25 NOTE — Patient Instructions (Signed)
Good to see you today- I will send you back to Dr. Durward Fortes to look at your right shoulder for you.  Please stop by for another x-ray of your chest this time; you can then go home, I'll be in touch with your report

## 2017-09-29 ENCOUNTER — Encounter: Payer: Self-pay | Admitting: Family Medicine

## 2017-10-01 ENCOUNTER — Encounter (INDEPENDENT_AMBULATORY_CARE_PROVIDER_SITE_OTHER): Payer: Self-pay | Admitting: Physical Medicine and Rehabilitation

## 2017-10-01 ENCOUNTER — Ambulatory Visit (INDEPENDENT_AMBULATORY_CARE_PROVIDER_SITE_OTHER): Payer: Medicare Other | Admitting: Physical Medicine and Rehabilitation

## 2017-10-01 VITALS — BP 137/58 | HR 78 | Temp 97.9°F | Ht 69.5 in | Wt 222.0 lb

## 2017-10-01 DIAGNOSIS — M545 Low back pain, unspecified: Secondary | ICD-10-CM

## 2017-10-01 DIAGNOSIS — M7918 Myalgia, other site: Secondary | ICD-10-CM

## 2017-10-01 DIAGNOSIS — M47816 Spondylosis without myelopathy or radiculopathy, lumbar region: Secondary | ICD-10-CM

## 2017-10-01 DIAGNOSIS — G8929 Other chronic pain: Secondary | ICD-10-CM

## 2017-10-01 MED ORDER — METHYLPREDNISOLONE ACETATE 40 MG/ML IJ SUSP
40.0000 mg | INTRAMUSCULAR | Status: AC | PRN
  Administered 2017-10-01: 40 mg via INTRAMUSCULAR

## 2017-10-01 NOTE — Progress Notes (Signed)
LIJAH BOURQUE - 84 y.o. male MRN [id number]  Date of birth: 04/30/1928  Office Visit Note: Visit Date: 10/01/2017 PCP: [name], MD Referred by: [name], MD  Subjective: Chief Complaint  Patient presents with  . Lower Back - Pain, Follow-up  . Left Hip - Pain   HPI: Mr. Durene Fruits is a very pleasant 84 year old gentleman that I saw several months ago completed trigger point injection which he said really relieved almost 20 years of chronic back pain that he was having in this 1 specific spot.  Those notes can be reviewed but we did complete a trigger point injection with needling technique but I did use a small amount of Depo-Medrol.  He comes in today with similar complaints just moved over more laterally over the top of the iliac crest on the left.  His prior area where we completed the trigger point injections still pain-free.  He has had issues in the spine in the past with stenosis and facet arthropathy.  He has no specific leg pain he said no specific trauma.  He has not had recent physical therapy.  He does report that his left leg seems to be getting shorter.  He has had bilateral hip replacements in the past.  The left side was maybe in 2014 and the right was in May 2002.  He is following up with Dr. Durward Fortes for his right shoulder I will ask him to ask him about his hips.  It may be a situation where he has some pelvic rotation and ache in his legs shoulder.  He does live at [facility] and does have access to physical therapy there.  I am not sure that they do any dry needling at that facility.  His symptoms are worse with bending and standing.  He has better symptom control with rest and change of position.   Review of Systems  Constitutional: Negative for chills, fever, malaise/fatigue and weight loss.  HENT: Negative for hearing loss and sinus pain.   Eyes: Negative for blurred vision, double vision and photophobia.  Respiratory: Negative for cough and shortness  of breath.   Cardiovascular: Negative for chest pain, palpitations and leg swelling.  Gastrointestinal: Negative for abdominal pain, nausea and vomiting.  Genitourinary: Negative for flank pain.  Musculoskeletal: Positive for back pain and joint pain. Negative for myalgias.  Skin: Negative for itching and rash.  Neurological: Negative for tremors, focal weakness and weakness.  Endo/Heme/Allergies: Negative.   Psychiatric/Behavioral: Negative for depression.  All other systems reviewed and are negative.  Otherwise per HPI.  Assessment & Plan: Visit Diagnoses:  1. Chronic left-sided low back pain without sciatica   2. Myofascial pain syndrome   3. Spondylosis without myelopathy or radiculopathy, lumbar region     Plan: Findings:  Chronic left-sided low back pain right above the iliac crest consistent with myofascial pain and trigger point.  Trigger point was palpated.  He clearly also has possibility of having a cluneal nerve entrapment which we do see in folks with pelvic rotation and changes over time.  We did complete trigger point injection and depending on relief he gets we would look at cluneal nerve injection versus imaging.  I also want to start him on a course of physical therapy at [facility] to look at his shortened left leg and pelvic rotation.  I would not change any medications at this point.  He will follow-up with Dr. Durward Fortes for his shoulder and hips.    Meds &  Orders: No orders of the defined types were placed in this encounter.   Orders Placed This Encounter  Procedures  . Trigger Point Inj  . Ambulatory referral to Physical Therapy    Follow-up: Return if symptoms worsen or fail to improve.   Procedures: Trigger Point Inj Date/Time: 10/01/2017 10:18 AM Performed by: Magnus Sinning, MD Authorized by: Magnus Sinning, MD   Consent Given by:  Patient Site marked: the procedure site was marked   Timeout: prior to procedure the correct patient, procedure, and  site was verified   Indications:  Pain Total # of Trigger Points:  1 Location: back   Needle Size:  25 G Approach:  Dorsal and lateral Medications #1:  40 mg methylPREDNISolone acetate 40 MG/ML Patient tolerance:  Patient tolerated the procedure well with no immediate complications Comments: Trigger point was palpated an area above the iliac crest which is likely latissimus dorsi.  This was needled with a needling technique.    No notes on file   Clinical History: Lumbar spine MRI dated 05/29/2016   L4-5: Anterolisthesis, uncovered disc with small bulge, central protrusion, moderate facet and ligamentum flavum hypertrophy, and large right foraminal and extra foraminal marginal osteophyte. Severe right foraminal narrowing. Mild left foraminal narrowing. Effacement of lateral recesses with contact upon the descending right L5 nerve root. No significant canal stenosis.   L5-S1: Small disc bulge and mild facet/ligamentum flavum hypertrophy. Mild bilateral foraminal narrowing. Right-sided 5 mm T2 hyperintense structure arising from the right facet joint possibly impinging the exiting right L5 nerve root likely a synovial cyst  Mild S-shaped lumbar curvature, grade 1 L4-5 anterolisthesis, and prominent discogenic and facet arthropathy. No acute osseous abnormality.  No high-grade canal stenosis. 3. Multiple levels of foraminal narrowing moderate at left L2-3, moderate at right L3-4, and severe at right L4-5 levels. 4. Large marginal osteophytes with contact upon exiting left L2 nerve root at the L2-3 level and right L3 nerve root at the L3-4 level.    He reports that he has quit smoking. He has never used smokeless tobacco.  Recent Labs    12/04/16 0758 02/06/17 1510  LABURIC 9.2* 7.0    Objective:  VS:  HT:5' 9.5" (176.5 cm)   WT:222 lb (100.7 kg)  BMI:32.32    BP:(!) 137/58  HR:78bpm  TEMP:97.9 F (36.6 C)( )  RESP:95 % Physical Exam  Constitutional: He is oriented  to person, place, and time. He appears well-developed and well-nourished. No distress.  HENT:  Head: Normocephalic and atraumatic.  Eyes: Pupils are equal, round, and reactive to light. Conjunctivae are normal.  Neck: Normal range of motion. Neck supple.  Cardiovascular: Regular rhythm and intact distal pulses.  Pulmonary/Chest: Effort normal. No respiratory distress.  Musculoskeletal:  Patient ambulates with a cane.  He does have pelvic tilt laterally and does wear a shoe buildup on the left.  He has pain to palpation over the left iliac crest just above this area in an area likely with time and musculature of the latissimus dorsi.  It is fairly superficial.  He has no pain over the greater trochanters and he has no pain with hip rotation.  He has good distal strength.  Neurological: He is alert and oriented to person, place, and time. He exhibits normal muscle tone. Coordination normal.  Skin: Skin is warm and dry. No rash noted. No erythema.  Psychiatric: He has a normal mood and affect.  Nursing note and vitals reviewed.   Ortho Exam Imaging: No results found.  Past Medical/Family/Surgical/Social History: Medications & Allergies reviewed per EMR, new medications updated. Patient Active Problem List   Diagnosis Date Noted  . 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 Chronic pain syndrome 09/26/2015  . De Quervain's tenosynovitis, right 09/13/2015  . Anemia due to antineoplastic chemotherapy 08/15/2015  . DLBCL (diffuse large B cell lymphoma) ([place]) 08/15/2015  . Skin lesion of face 05/21/2015  . Peripheral neuropathy due to chemotherapy ([place]) 02/09/2015  . Benign prostatic hyperplasia with urinary obstruction 02/09/2015  . Bilateral hearing loss 02/09/2015  . Low back pain 02/09/2015  . Chronic kidney disease (CKD), stage III (moderate) ([place]) 02/09/2015  . Essential (primary) hypertension 02/09/2015  . Gastro-esophageal reflux disease  without esophagitis 02/09/2015  . Anxiety, generalized 02/09/2015  . Cannot sleep 02/09/2015  . Combined fat and carbohydrate induced hyperlipemia 02/09/2015   Past Medical History:  Diagnosis Date  . Anemia   . Anemia due to antineoplastic chemotherapy 08/15/2015  . Anxiety   . Arthritis   . Cancer ([place])   . Cataract   . Chronic kidney disease (CKD), stage III (moderate) ([facility]) 02/09/2015   Controlled  . CMML (chronic myelomonocytic leukemia) ([place])   . Combined fat and carbohydrate induced hyperlipemia 02/09/2015   Controlled  . Depression   . DLBCL (diffuse large B cell lymphoma) ([place]) 08/15/2015  . Essential (primary) hypertension 02/09/2015  . Gastro-esophageal reflux disease without esophagitis 02/09/2015   Controlled  . Neuromuscular disorder ([place])   . Tinnitus    Family History  Problem Relation Age of Onset  . Anuerysm Mother 21       Deceased  . Colon cancer Father 51       Deceased  . Colon cancer Brother   . Prostate cancer Brother        Deceased  . Alzheimer's disease Brother   . Heart disease Brother   . Alzheimer's disease Paternal Grandmother   . Early death Maternal Grandmother        Unknown  . Obesity Son   . Obesity Son        Gastric Bypass  . Healthy Son   . Healthy Daughter   . Diabetes Neg Hx    Past Surgical History:  Procedure Laterality Date  . CATARACT EXTRACTION, BILATERAL    . COLONOSCOPY  May 2011  . CYSTOSCOPY    . TOTAL HIP ARTHROPLASTY  2003   Right   . TOTAL HIP ARTHROPLASTY  April, 2011   Left  . TRIGGER FINGER RELEASE  Nov 2013  . TURBINECTOMY  2006  . UPPER GI ENDOSCOPY  Nov 2015   Social History   Occupational History  . Not on file  Tobacco Use  . Smoking status: Former Research scientist (life sciences)  . Smokeless tobacco: Never Used  . Tobacco comment: Quit >50 years ago  Substance and Sexual Activity  . Alcohol use: Yes    Alcohol/week: 0.6 - 2.4 oz    Types: 1 - 4 Shots of liquor per week    Comment: 4 glasses per week  . Drug use:  No  . Sexual activity: Not on file

## 2017-10-01 NOTE — Progress Notes (Signed)
Numeric Pain Rating Scale and Functional Assessment Average Pain 5   In the last MONTH (on 0-10 scale) has pain interfered with the following?  1. General activity like being  able to carry out your everyday physical activities such as walking, climbing stairs, carrying groceries, or moving a chair?  Rating(4)

## 2017-10-04 ENCOUNTER — Telehealth (INDEPENDENT_AMBULATORY_CARE_PROVIDER_SITE_OTHER): Payer: Self-pay | Admitting: Physical Medicine and Rehabilitation

## 2017-10-07 NOTE — Telephone Encounter (Signed)
May not get the relief he wants unfortunately. I would use the script we gave him for PT at [facility] and see [place] in 1 month for possible TP injection. May need different PT for dry needling at some point

## 2017-10-07 NOTE — Telephone Encounter (Signed)
Scheduled for 10/31/17 at 0945.

## 2017-10-09 ENCOUNTER — Other Ambulatory Visit: Payer: Self-pay | Admitting: Family Medicine

## 2017-10-09 ENCOUNTER — Encounter: Payer: Self-pay | Admitting: Family Medicine

## 2017-10-09 DIAGNOSIS — R2689 Other abnormalities of gait and mobility: Secondary | ICD-10-CM | POA: Diagnosis not present

## 2017-10-09 DIAGNOSIS — M25511 Pain in right shoulder: Secondary | ICD-10-CM | POA: Diagnosis not present

## 2017-10-09 DIAGNOSIS — G894 Chronic pain syndrome: Secondary | ICD-10-CM

## 2017-10-09 DIAGNOSIS — M6281 Muscle weakness (generalized): Secondary | ICD-10-CM | POA: Diagnosis not present

## 2017-10-09 MED ORDER — HYDROCODONE-ACETAMINOPHEN 7.5-325 MG PO TABS: 1.0000 | ORAL_TABLET | Freq: Four times a day (QID) | ORAL | 0 refills | Status: DC | PRN

## 2017-10-09 NOTE — Telephone Encounter (Signed)
Received refill request for HYDROcodone-acetaminophen (NORCO) 7.5-3.25 MG tablet. Last office visit 09/25/2017 and refill 09/03/17.

## 2017-10-09 NOTE — Telephone Encounter (Signed)
Seen just recently [place]: reviewed today  Contract is signed UDS is due, will ask pt to come in and do this soon   09/25/2017  1  08/16/2017  Alprazolam 0.5 Mg Tablet  90 30 Je Cop  [id number]  Wal ([id number])  1 3.00 LME Comm Ins  [place]  09/03/2017  1  09/03/2017  Hydrocodone-Acetamin 7.5-325  100 25 Je Cop  [id number]  Wal ([id number])  0 30.00 MME Comm Ins  [place]  08/16/2017  1  08/16/2017  Alprazolam 0.5 Mg Tablet  90 30 Je Cop  [id number]  Wal ([id number])  0 3.00 LME Comm Ins  [place]  07/11/2017  1  07/11/2017  Hydrocodone-Acetamin 7.5-325  100 25 Je Cop  [id number]  Wal ([id number])  0 30.00 MME Comm Ins  [place]  07/10/2017  1  05/06/2017  Alprazolam 0.5 Mg Tablet  90 30 Je Cop  [id number]  Wal ([id number])  2 3.00 LME Comm Ins  [place]  06/10/2017  1  06/06/2017  Hydrocodone-Acetamin 7.5-325  100 25 Je Cop  [id number]  Med ([id number])  0 30.00 MME Medicare  [employer]  06/05/2017  1  05/06/2017  Alprazolam 0.5 Mg Tablet  90 30 Je Cop  [id number]  Wal ([id number])  1

## 2017-10-14 ENCOUNTER — Encounter (INDEPENDENT_AMBULATORY_CARE_PROVIDER_SITE_OTHER): Payer: Self-pay | Admitting: Orthopaedic Surgery

## 2017-10-14 ENCOUNTER — Ambulatory Visit (INDEPENDENT_AMBULATORY_CARE_PROVIDER_SITE_OTHER): Payer: Medicare Other | Admitting: Orthopaedic Surgery

## 2017-10-14 VITALS — BP 129/55 | HR 73 | Resp 16 | Ht 69.5 in | Wt 212.0 lb

## 2017-10-14 DIAGNOSIS — M25511 Pain in right shoulder: Secondary | ICD-10-CM

## 2017-10-14 MED ORDER — METHYLPREDNISOLONE ACETATE 40 MG/ML IJ SUSP
80.0000 mg | INTRAMUSCULAR | Status: AC | PRN
  Administered 2017-10-14: 80 mg

## 2017-10-14 MED ORDER — BUPIVACAINE HCL 0.5 % IJ SOLN
2.0000 mL | INTRAMUSCULAR | Status: AC | PRN
  Administered 2017-10-14: 2 mL via INTRA_ARTICULAR

## 2017-10-14 MED ORDER — LIDOCAINE HCL 2 % IJ SOLN
2.0000 mL | INTRAMUSCULAR | Status: AC | PRN
  Administered 2017-10-14: 2 mL

## 2017-10-14 NOTE — Progress Notes (Signed)
Office Visit Note   Patient: Jeremiah Walker           Date of Birth: May 29, 1928           MRN: [id number] Visit Date: 10/14/2017              Requested by: Jeremiah Mclean, MD [name] [place], [place] [address] PCP: Jeremiah Mclean, MD   Assessment & Plan: Visit Diagnoses:  1. Acute pain of right shoulder     Plan: Impingement syndrome right shoulder.  Long discussion regarding diagnostic possibilities.  Will inject the subacromial region and monitor response  Follow-Up Instructions: Return in about 1 month (around 11/13/2017).   Orders:  Orders Placed This Encounter  Procedures  . Large Joint Inj: R subacromial bursa   No orders of the defined types were placed in this encounter.     Procedures: Large Joint Inj: R subacromial bursa on 10/14/2017 12:02 PM Indications: pain and diagnostic evaluation Details: 25 G 1.5 in needle, anterolateral approach  Arthrogram: No  Medications: 2 mL lidocaine 2 %; 2 mL bupivacaine 0.5 %; 80 mg methylPREDNISolone acetate 40 MG/ML Consent was given by the patient. Immediately prior to procedure a time out was called to verify the correct patient, procedure, equipment, support staff and site/side marked as required. Patient was prepped and draped in the usual sterile fashion.       Clinical Data: No additional findings.   Subjective: Chief Complaint  Patient presents with  . Right Shoulder - Pain    3-4 month pain in right shoulder got worse while using arm exercise machine.  . Left Hip - Pain    Had back injection w dr newton 2 weeks ago , helped first few days now back in [place], no injury  . New Patient (Initial Visit)    r shoulder pain for 3-4 months no injury, using arm machine friday and made it worse, sat had sharp pain & then went away  Jeremiah Walker relates recent onset of right shoulder pain without obvious injury or trauma.  He does not like to work out at [employer] and is not sure if that is or  responsible for his pain.  He had several episodes where the pain would actually awaken him at night.  The pain does come and go and is relatively localized to the shoulder.  Pain can be quite sharp but momentary.  No skin changes.  No specific pain referred from the cervical spine. Also relates that he is been having to increase the lift in his right shoe.  He has had prior bilateral hip replacements elsewhere and is noted to have a shorter left lower extremity requiring lifts.  He has been seeing Dr. [name] for lumbar spine injections and feels more comfortable with lifts in his left shoe with both his hip as well as his back.  Recently feeling better after his last injection Has had recent right shoulder films are reviewed on the PACS system.  The humeral head is centered about the glenoid.  No obvious narrowing of the joint space.  Normal space between the humeral head and the acromion.  Acromioclavicular joint degenerative changes identified with inferiorly directed spurs  HPI  Review of Systems  Constitutional: Positive for fatigue. Negative for fever.  HENT: Negative for ear pain.   Eyes: Negative for pain.  Respiratory: Negative for cough and shortness of breath.   Cardiovascular: Negative for leg swelling.  Gastrointestinal: Negative  for constipation and diarrhea.  Genitourinary: Negative for difficulty urinating.  Musculoskeletal: Positive for back pain. Negative for neck pain.  Skin: Negative for rash.  Allergic/Immunologic: Negative for food allergies.  Neurological: Positive for weakness and numbness.  Hematological: Bruises/bleeds easily.  Psychiatric/Behavioral: Negative for sleep disturbance.     Objective: Vital Signs: BP (!) 129/55 (BP Location: Left Arm, Patient Position: Sitting, Cuff Size: Normal)   Pulse 73   Resp 16   Ht 5' 9.5" (1.765 m)   Wt 212 lb (96.2 kg)   BMI 30.86 kg/m   Physical Exam  Constitutional: He is oriented to person, place, and time. He  appears well-developed and well-nourished.  Eyes: Pupils are equal, round, and reactive to light. EOM are normal.  Neck: Neck supple.  Neurological: He is alert and oriented to person, place, and time.  Skin: Skin is warm and dry.  Psychiatric: He has a normal mood and affect. His behavior is normal.    Ortho Exam awake alert and oriented x3.  Comfortable sitting.  Does not have impingement right shoulder.  Minimally positive empty can testing.  Mild tenderness in the anterior subacromial region.  Biceps intact.  Skin intact.  Right arm over his head quickly without much pain.  No popping or clicking.  No loss of internal or external rotation.  Good grip distally.  Neurovascular exam intact.  No pain right shoulder with motion of the cervical spine  Specialty Comments:  No specialty comments available.  Imaging: No results found.   PMFS History: Patient Active Problem List   Diagnosis Date Noted  . 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 Chronic pain syndrome 09/26/2015  . De Quervain's tenosynovitis, right 09/13/2015  . Anemia due to antineoplastic chemotherapy 08/15/2015  . DLBCL (diffuse large B cell lymphoma) ([place]) 08/15/2015  . Skin lesion of face 05/21/2015  . Peripheral neuropathy due to chemotherapy ([place]) 02/09/2015  . Benign prostatic hyperplasia with urinary obstruction 02/09/2015  . Bilateral hearing loss 02/09/2015  . Low back pain 02/09/2015  . Chronic kidney disease (CKD), stage III (moderate) ([place]) 02/09/2015  . Essential (primary) hypertension 02/09/2015  . Gastro-esophageal reflux disease without esophagitis 02/09/2015  . Anxiety, generalized 02/09/2015  . Cannot sleep 02/09/2015  . Combined fat and carbohydrate induced hyperlipemia 02/09/2015   Past Medical History:  Diagnosis Date  . Anemia   . Anemia due to antineoplastic chemotherapy 08/15/2015  . Anxiety   . Arthritis   . Cancer ([place])   . Cataract   . Chronic  kidney disease (CKD), stage III (moderate) ([facility]) 02/09/2015   Controlled  . CMML (chronic myelomonocytic leukemia) ([place])   . CMML (chronic myelomonocytic leukemia) ([place])   . Combined fat and carbohydrate induced hyperlipemia 02/09/2015   Controlled  . Depression   . DLBCL (diffuse large B cell lymphoma) ([place]) 08/15/2015  . Essential (primary) hypertension 02/09/2015  . Gastro-esophageal reflux disease without esophagitis 02/09/2015   Controlled  . Neuromuscular disorder ([place])   . Tinnitus     Family History  Problem Relation Age of Onset  . Anuerysm Mother 58       Deceased  . Colon cancer Father 56       Deceased  . Colon cancer Brother   . Prostate cancer Brother        Deceased  . Alzheimer's disease Brother   . Heart disease Brother   . Alzheimer's disease Paternal Grandmother   . Early death Maternal Grandmother  Unknown  . Obesity Son   . Obesity Son        Gastric Bypass  . Healthy Son   . Healthy Daughter   . Diabetes Neg Hx     Past Surgical History:  Procedure Laterality Date  . CATARACT EXTRACTION, BILATERAL    . COLONOSCOPY  May 2011  . CYSTOSCOPY    . TOTAL HIP ARTHROPLASTY  2003   Right   . TOTAL HIP ARTHROPLASTY  April, 2011   Left  . TRIGGER FINGER RELEASE  Nov 2013  . TURBINECTOMY  2006  . UPPER GI ENDOSCOPY  Nov 2015   Social History   Occupational History  . Not on file  Tobacco Use  . Smoking status: Former Research scientist (life sciences)  . Smokeless tobacco: Never Used  . Tobacco comment: Quit >50 years ago  Substance and Sexual Activity  . Alcohol use: Yes    Alcohol/week: 0.6 - 2.4 oz    Types: 1 - 4 Shots of liquor per week    Comment: 4 glasses per week  . Drug use: No  . Sexual activity: Not on file

## 2017-10-15 DIAGNOSIS — R2689 Other abnormalities of gait and mobility: Secondary | ICD-10-CM | POA: Diagnosis not present

## 2017-10-15 DIAGNOSIS — M25511 Pain in right shoulder: Secondary | ICD-10-CM | POA: Diagnosis not present

## 2017-10-15 DIAGNOSIS — M6281 Muscle weakness (generalized): Secondary | ICD-10-CM | POA: Diagnosis not present

## 2017-10-17 DIAGNOSIS — M6281 Muscle weakness (generalized): Secondary | ICD-10-CM | POA: Diagnosis not present

## 2017-10-17 DIAGNOSIS — M25511 Pain in right shoulder: Secondary | ICD-10-CM | POA: Diagnosis not present

## 2017-10-17 DIAGNOSIS — R2689 Other abnormalities of gait and mobility: Secondary | ICD-10-CM | POA: Diagnosis not present

## 2017-10-21 DIAGNOSIS — M25511 Pain in right shoulder: Secondary | ICD-10-CM | POA: Diagnosis not present

## 2017-10-21 DIAGNOSIS — M6281 Muscle weakness (generalized): Secondary | ICD-10-CM | POA: Diagnosis not present

## 2017-10-21 DIAGNOSIS — R2689 Other abnormalities of gait and mobility: Secondary | ICD-10-CM | POA: Diagnosis not present

## 2017-10-24 DIAGNOSIS — M6281 Muscle weakness (generalized): Secondary | ICD-10-CM | POA: Diagnosis not present

## 2017-10-24 DIAGNOSIS — R2689 Other abnormalities of gait and mobility: Secondary | ICD-10-CM | POA: Diagnosis not present

## 2017-10-24 DIAGNOSIS — M25511 Pain in right shoulder: Secondary | ICD-10-CM | POA: Diagnosis not present

## 2017-10-29 DIAGNOSIS — M25511 Pain in right shoulder: Secondary | ICD-10-CM | POA: Diagnosis not present

## 2017-10-29 DIAGNOSIS — M6281 Muscle weakness (generalized): Secondary | ICD-10-CM | POA: Diagnosis not present

## 2017-10-29 DIAGNOSIS — R2689 Other abnormalities of gait and mobility: Secondary | ICD-10-CM | POA: Diagnosis not present

## 2017-10-31 ENCOUNTER — Encounter (INDEPENDENT_AMBULATORY_CARE_PROVIDER_SITE_OTHER): Payer: Self-pay | Admitting: Physical Medicine and Rehabilitation

## 2017-10-31 ENCOUNTER — Ambulatory Visit (INDEPENDENT_AMBULATORY_CARE_PROVIDER_SITE_OTHER): Payer: Medicare Other | Admitting: Physical Medicine and Rehabilitation

## 2017-10-31 VITALS — BP 135/56 | HR 62 | Temp 97.9°F

## 2017-10-31 DIAGNOSIS — M545 Low back pain: Secondary | ICD-10-CM | POA: Diagnosis not present

## 2017-10-31 DIAGNOSIS — M7918 Myalgia, other site: Secondary | ICD-10-CM

## 2017-10-31 DIAGNOSIS — M47816 Spondylosis without myelopathy or radiculopathy, lumbar region: Secondary | ICD-10-CM

## 2017-10-31 DIAGNOSIS — G8929 Other chronic pain: Secondary | ICD-10-CM | POA: Diagnosis not present

## 2017-10-31 DIAGNOSIS — M25511 Pain in right shoulder: Secondary | ICD-10-CM | POA: Diagnosis not present

## 2017-10-31 DIAGNOSIS — R2689 Other abnormalities of gait and mobility: Secondary | ICD-10-CM | POA: Diagnosis not present

## 2017-10-31 DIAGNOSIS — M6281 Muscle weakness (generalized): Secondary | ICD-10-CM | POA: Diagnosis not present

## 2017-10-31 NOTE — Progress Notes (Signed)
Jeremiah Walker is an 84 year old gentleman that comes in today for follow-up after having had a couple of trigger point injections in the lower back which is really relieved a lot of his chronic back pain that he says he has had for many years and nothing is really helped.  He does have some spondylosis and arthritis of the lumbar spine and a history of polyneuropathy.  I do think some of his back pain in general is been arthritic related but he clearly has some myofascial pain.  He is done well with physical therapy and continues to do well.  He says his average pain is a 3 out of 10 and is doing fine in terms of his back.  He is having a lot of issues with his right shoulder and this is followed by Dr. Durward Fortes.  He is doing physical therapy for this at the moment.  He will continue to follow-up with Dr. Durward Fortes and see [place] as needed.  Brief exam today shows that he walks with a cane.  He does have pain with extension.  No real significant focal trigger points today although somewhat tender in a few areas.  He has good distal strength.  Again we will see him as needed if his symptoms return.  .Numeric Pain Rating Scale and Functional Assessment Average Pain 3 Pain Right Now 2 My pain is intermittent and dull Pain is worse with: some activites Pain improves with: therapy/exercise and medication   In the last MONTH (on 0-10 scale) has pain interfered with the following?  1. General activity like being  able to carry out your everyday physical activities such as walking, climbing stairs, carrying groceries, or moving a chair?  Rating(2)  2. Relation with others like being able to carry out your usual social activities and roles such as  activities at home, at work and in your community. Rating(3)  3. Enjoyment of life such that you have  been bothered by emotional problems such as feeling anxious, depressed or irritable?  Rating(0)

## 2017-11-04 DIAGNOSIS — H353131 Nonexudative age-related macular degeneration, bilateral, early dry stage: Secondary | ICD-10-CM | POA: Diagnosis not present

## 2017-11-04 DIAGNOSIS — H40013 Open angle with borderline findings, low risk, bilateral: Secondary | ICD-10-CM | POA: Diagnosis not present

## 2017-11-04 DIAGNOSIS — H04123 Dry eye syndrome of bilateral lacrimal glands: Secondary | ICD-10-CM | POA: Diagnosis not present

## 2017-11-04 DIAGNOSIS — M25511 Pain in right shoulder: Secondary | ICD-10-CM | POA: Diagnosis not present

## 2017-11-04 DIAGNOSIS — R2689 Other abnormalities of gait and mobility: Secondary | ICD-10-CM | POA: Diagnosis not present

## 2017-11-04 DIAGNOSIS — M6281 Muscle weakness (generalized): Secondary | ICD-10-CM | POA: Diagnosis not present

## 2017-11-04 DIAGNOSIS — Z961 Presence of intraocular lens: Secondary | ICD-10-CM | POA: Diagnosis not present

## 2017-11-05 ENCOUNTER — Encounter (INDEPENDENT_AMBULATORY_CARE_PROVIDER_SITE_OTHER): Payer: Self-pay | Admitting: Orthopaedic Surgery

## 2017-11-05 ENCOUNTER — Ambulatory Visit (INDEPENDENT_AMBULATORY_CARE_PROVIDER_SITE_OTHER): Payer: Self-pay

## 2017-11-05 ENCOUNTER — Ambulatory Visit (INDEPENDENT_AMBULATORY_CARE_PROVIDER_SITE_OTHER): Payer: Medicare Other | Admitting: Orthopaedic Surgery

## 2017-11-05 VITALS — BP 146/74 | HR 74 | Resp 18 | Ht 69.5 in | Wt 213.0 lb

## 2017-11-05 DIAGNOSIS — R0781 Pleurodynia: Secondary | ICD-10-CM

## 2017-11-05 NOTE — Progress Notes (Signed)
Office Visit Note   Patient: Jeremiah Walker           Date of Birth: [birth year]           MRN: [id number] Visit Date: 11/05/2017              Requested by: Jeremiah Walker [address] [place],  [address] PCP: Jeremiah Walker   Assessment & Plan: Visit Diagnoses:  1. Rib pain on right side     Plan: Probable intercostal muscle pull right rib cage.  Will monitor over the next 3 to 4 weeks.  X-rays were negative  Follow-Up Instructions: No follow-ups on file.   Orders:  Orders Placed This Encounter  Procedures  . XR Ribs Unilateral Right   No orders of the defined types were placed in this encounter.     Procedures: No procedures performed   Clinical Data: No additional findings.   Subjective: Chief Complaint  Patient presents with  . Right Shoulder - Pain  . Follow-up    r shoulder impengement, injection helped but muscle under arm has gotten much worse  Jeremiah Walker was recently seen to for evaluation of right shoulder pain.  I performed a subacromial cortisone injection notes that it completely eliminated his pain.  He has been experiencing some pain the area of the proximal right rib cage.  It seems to originate somewhere around his right scapula and radiates to the nipple line.  It is completely positional.  Does not appear to be related to shoulder motion.  He has not had any shortness of breath or chest pain.  He is been unable to palpate any pain injury or trauma.  Has had some physical therapy at [facility] where he lives.  Pain has been present for approximately 3 weeks.  HPI  Review of Systems  Constitutional: Positive for fatigue. Negative for fever.  HENT: Negative for ear pain.   Eyes: Negative for pain.  Respiratory: Negative for cough and shortness of breath.   Cardiovascular: Negative for leg swelling.  Gastrointestinal: Negative for constipation and diarrhea.  Genitourinary: Negative for difficulty urinating.   Musculoskeletal: Positive for back pain and neck pain.  Skin: Negative for rash.  Allergic/Immunologic: Negative for food allergies.  Neurological: Negative for weakness and numbness.  Hematological: Bruises/bleeds easily.  Psychiatric/Behavioral: Positive for sleep disturbance.     Objective: Vital Signs: BP (!) 146/74 (BP Location: Left Arm, Patient Position: Sitting, Cuff Size: Normal)   Pulse 74   Resp 18   Ht 5' 9.5" (1.765 m)   Wt 213 lb (96.6 kg)   BMI 31.00 kg/m   Physical Exam  Constitutional: He is oriented to person, place, and time. He appears well-developed and well-nourished.  HENT:  Mouth/Throat: Oropharynx is clear and moist.  Eyes: Pupils are equal, round, and reactive to light. EOM are normal.  Pulmonary/Chest: Effort normal.  Neurological: He is alert and oriented to person, place, and time.  Skin: Skin is warm and dry.  Psychiatric: He has a normal mood and affect. His behavior is normal.    Ortho Exam awake alert and oriented x3.  Comfortable sitting.  Absolutely no pain with any motion of the right shoulder.  Negative empty can and impingement testing.  No localized areas of tenderness.  I could not palpate any discomfort around the right scapular or chest wall.  He could only reproduce the pain by lying down and moving his arm while laying  on his right side.  No shortness of breath or chest pain.  I cannot  feel any masses.  Skin intact.  Neurovascular exam intact   No specialty comments available.  Imaging: No results found.   PMFS History: Patient Active Problem List   Diagnosis Date Noted  . 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 Chronic pain syndrome 09/26/2015  . De Quervain's tenosynovitis, right 09/13/2015  . Anemia due to antineoplastic chemotherapy 08/15/2015  . DLBCL (diffuse large B cell lymphoma) ([place]) 08/15/2015  . Skin lesion of face 05/21/2015  . Peripheral neuropathy due to chemotherapy  ([place]) 02/09/2015  . Benign prostatic hyperplasia with urinary obstruction 02/09/2015  . Bilateral hearing loss 02/09/2015  . Low back pain 02/09/2015  . Chronic kidney disease (CKD), stage III (moderate) ([place]) 02/09/2015  . Essential (primary) hypertension 02/09/2015  . Gastro-esophageal reflux disease without esophagitis 02/09/2015  . Anxiety, generalized 02/09/2015  . Cannot sleep 02/09/2015  . Combined fat and carbohydrate induced hyperlipemia 02/09/2015   Past Medical History:  Diagnosis Date  . Anemia   . Anemia due to antineoplastic chemotherapy 08/15/2015  . Anxiety   . Arthritis   . Cancer ([facility])   . Cataract   . Chronic kidney disease (CKD), stage III (moderate) ([facility]) 02/09/2015   Controlled  . CMML (chronic myelomonocytic leukemia) ([place])   . CMML (chronic myelomonocytic leukemia) ([place])   . Combined fat and carbohydrate induced hyperlipemia 02/09/2015   Controlled  . Depression   . DLBCL (diffuse large B cell lymphoma) ([place]) 08/15/2015  . Essential (primary) hypertension 02/09/2015  . Gastro-esophageal reflux disease without esophagitis 02/09/2015   Controlled  . Neuromuscular disorder ([place])   . Tinnitus     Family History  Problem Relation Age of Onset  . Anuerysm Mother 67       Deceased  . Colon cancer Father 31       Deceased  . Colon cancer Brother   . Prostate cancer Brother        Deceased  . Alzheimer's disease Brother   . Heart disease Brother   . Alzheimer's disease Paternal Grandmother   . Early death Maternal Grandmother        Unknown  . Obesity Son   . Obesity Son        Gastric Bypass  . Healthy Son   . Healthy Daughter   . Diabetes Neg Hx     Past Surgical History:  Procedure Laterality Date  . CATARACT EXTRACTION, BILATERAL    . COLONOSCOPY  May 2011  . CYSTOSCOPY    . TOTAL HIP ARTHROPLASTY  2003   Right   . TOTAL HIP ARTHROPLASTY  April, 2011   Left  . TRIGGER FINGER RELEASE  Nov 2013  . TURBINECTOMY  2006  . UPPER GI ENDOSCOPY   Nov 2015   Social History   Occupational History  . Not on file  Tobacco Use  . Smoking status: Former Research scientist (life sciences)  . Smokeless tobacco: Never Used  . Tobacco comment: Quit >50 years ago  Substance and Sexual Activity  . Alcohol use: Yes    Alcohol/week: 0.6 - 2.4 oz    Types: 1 - 4 Shots of liquor per week    Comment: 4 glasses per week  . Drug use: No  . Sexual activity: Not on file

## 2017-11-07 DIAGNOSIS — R2689 Other abnormalities of gait and mobility: Secondary | ICD-10-CM | POA: Diagnosis not present

## 2017-11-07 DIAGNOSIS — M6281 Muscle weakness (generalized): Secondary | ICD-10-CM | POA: Diagnosis not present

## 2017-11-07 DIAGNOSIS — M25511 Pain in right shoulder: Secondary | ICD-10-CM | POA: Diagnosis not present

## 2017-11-12 DIAGNOSIS — R2689 Other abnormalities of gait and mobility: Secondary | ICD-10-CM | POA: Diagnosis not present

## 2017-11-12 DIAGNOSIS — M6281 Muscle weakness (generalized): Secondary | ICD-10-CM | POA: Diagnosis not present

## 2017-11-12 DIAGNOSIS — M25511 Pain in right shoulder: Secondary | ICD-10-CM | POA: Diagnosis not present

## 2017-11-14 DIAGNOSIS — M6281 Muscle weakness (generalized): Secondary | ICD-10-CM | POA: Diagnosis not present

## 2017-11-14 DIAGNOSIS — R2689 Other abnormalities of gait and mobility: Secondary | ICD-10-CM | POA: Diagnosis not present

## 2017-11-14 DIAGNOSIS — M25511 Pain in right shoulder: Secondary | ICD-10-CM | POA: Diagnosis not present

## 2017-11-19 ENCOUNTER — Encounter: Payer: Self-pay | Admitting: Family Medicine

## 2017-11-19 ENCOUNTER — Inpatient Hospital Stay: Payer: Medicare Other | Attending: Hematology & Oncology | Admitting: Hematology & Oncology

## 2017-11-19 ENCOUNTER — Other Ambulatory Visit: Payer: Self-pay | Admitting: Family Medicine

## 2017-11-19 ENCOUNTER — Inpatient Hospital Stay: Payer: Medicare Other

## 2017-11-19 VITALS — BP 130/83 | HR 94 | Temp 97.4°F | Resp 16 | Wt 218.2 lb

## 2017-11-19 DIAGNOSIS — M6281 Muscle weakness (generalized): Secondary | ICD-10-CM | POA: Diagnosis not present

## 2017-11-19 DIAGNOSIS — G894 Chronic pain syndrome: Secondary | ICD-10-CM

## 2017-11-19 DIAGNOSIS — C9310 Chronic myelomonocytic leukemia not having achieved remission: Secondary | ICD-10-CM | POA: Diagnosis not present

## 2017-11-19 DIAGNOSIS — Z9221 Personal history of antineoplastic chemotherapy: Secondary | ICD-10-CM | POA: Diagnosis not present

## 2017-11-19 DIAGNOSIS — Z8572 Personal history of non-Hodgkin lymphomas: Secondary | ICD-10-CM | POA: Insufficient documentation

## 2017-11-19 DIAGNOSIS — T451X5A Adverse effect of antineoplastic and immunosuppressive drugs, initial encounter: Secondary | ICD-10-CM

## 2017-11-19 DIAGNOSIS — N183 Chronic kidney disease, stage 3 unspecified: Secondary | ICD-10-CM

## 2017-11-19 DIAGNOSIS — Z79899 Other long term (current) drug therapy: Secondary | ICD-10-CM | POA: Diagnosis not present

## 2017-11-19 DIAGNOSIS — D6481 Anemia due to antineoplastic chemotherapy: Secondary | ICD-10-CM

## 2017-11-19 DIAGNOSIS — R2689 Other abnormalities of gait and mobility: Secondary | ICD-10-CM | POA: Diagnosis not present

## 2017-11-19 DIAGNOSIS — R5383 Other fatigue: Secondary | ICD-10-CM | POA: Diagnosis not present

## 2017-11-19 DIAGNOSIS — R531 Weakness: Secondary | ICD-10-CM

## 2017-11-19 DIAGNOSIS — E032 Hypothyroidism due to medicaments and other exogenous substances: Secondary | ICD-10-CM

## 2017-11-19 DIAGNOSIS — M25511 Pain in right shoulder: Secondary | ICD-10-CM | POA: Diagnosis not present

## 2017-11-19 LAB — CBC WITH DIFFERENTIAL (CANCER CENTER ONLY)
BASOS ABS: 0.1 10*3/uL (ref 0.0–0.1)
Basophils Relative: 2 %
EOS PCT: 1 %
Eosinophils Absolute: 0 10*3/uL (ref 0.0–0.5)
HCT: 33.5 % — ABNORMAL LOW (ref 38.7–49.9)
HEMOGLOBIN: 11.1 g/dL — AB (ref 13.0–17.1)
LYMPHS ABS: 1.9 10*3/uL (ref 0.9–3.3)
LYMPHS PCT: 36 %
MCH: 35.6 pg — ABNORMAL HIGH (ref 28.0–33.4)
MCHC: 33.1 g/dL (ref 32.0–35.9)
MCV: 107.4 fL — AB (ref 82.0–98.0)
Monocytes Absolute: 2 10*3/uL — ABNORMAL HIGH (ref 0.1–0.9)
Monocytes Relative: 38 %
NEUTROS PCT: 23 %
Neutro Abs: 1.2 10*3/uL — ABNORMAL LOW (ref 1.5–6.5)
PLATELETS: 117 10*3/uL — AB (ref 145–400)
RBC: 3.12 MIL/uL — AB (ref 4.20–5.70)
RDW: 13.8 % (ref 11.1–15.7)
WBC Count: 5.2 10*3/uL (ref 4.0–10.0)

## 2017-11-19 LAB — CMP (CANCER CENTER ONLY)
ALK PHOS: 67 U/L (ref 26–84)
ALT: 23 U/L (ref 10–47)
ANION GAP: 8 (ref 5–15)
AST: 22 U/L (ref 11–38)
Albumin: 3.5 g/dL (ref 3.5–5.0)
BUN: 22 mg/dL (ref 7–22)
CALCIUM: 9.3 mg/dL (ref 8.0–10.3)
CO2: 28 mmol/L (ref 18–33)
Chloride: 106 mmol/L (ref 98–108)
Creatinine: 1.4 mg/dL — ABNORMAL HIGH (ref 0.60–1.20)
GLUCOSE: 121 mg/dL — AB (ref 73–118)
Potassium: 3.7 mmol/L (ref 3.3–4.7)
SODIUM: 142 mmol/L (ref 128–145)
TOTAL PROTEIN: 7 g/dL (ref 6.4–8.1)
Total Bilirubin: 0.9 mg/dL (ref 0.2–1.6)

## 2017-11-19 LAB — LACTATE DEHYDROGENASE: LDH: 193 U/L (ref 125–245)

## 2017-11-19 LAB — TECHNOLOGIST SMEAR REVIEW

## 2017-11-19 MED ORDER — HYDROCODONE-ACETAMINOPHEN 7.5-325 MG PO TABS: 1.0000 | ORAL_TABLET | Freq: Four times a day (QID) | ORAL | 0 refills | Status: DC | PRN

## 2017-11-19 NOTE — Telephone Encounter (Signed)
Copied from [place] [id number]. Topic: Quick Communication - See Telephone Encounter >> Nov 19, 2017 10:42 AM Rosalin Hawking wrote: CRM for notification. See Telephone encounter for: 11/19/17.   Pt is needing refill on HYDROcodone-acetaminophen (NORCO) 7.5-325 MG tablet. Pt would like to have meds sent to [facility], [place] - [address] AT [place]. Please advise.

## 2017-11-19 NOTE — Telephone Encounter (Signed)
Pt is requesting refill on hydrocodone 7.5-325mg .

## 2017-11-19 NOTE — Telephone Encounter (Signed)
Requesting:Hydrocodone-acetaminophen Contract:08/05/15 needs updated CSC UDS:08/14/16 needs updated UDS Last Visit:09/25/17 Next Visit:none with pcp Last Refill:10/09/17  No Discrepancies   Please Advise

## 2017-11-19 NOTE — Progress Notes (Signed)
Hematology and Oncology Follow Up Visit  Jeremiah Walker [id number] 05-10-1928 84 y.o. 11/19/2017   Principle Diagnosis:    History of diffuse large cell non-Hodgkin's lymphoma-treated in [place]  Chronic Myelomonocytic Leukemia (CMMoL) - Normal cytogenetics  Current Therapy:    Observation     Interim History:  Jeremiah Walker is back for follow-up.  He is doing okay.  He still feels quite tired.  I am not sure as to what else could be causing him to feel tired outside of his condition.  His blood counts certainly have not changed any since we last saw him.  He has had no fever.  He has had no nausea or vomiting.  He has had no rashes.  Is had no leg swelling.  He is taking physical therapy.  He does have intercostal pain over on the right side.  This is been chronic.  He said that did feel a little bit better after he took some iron last week.  Maybe he is iron deficient.  We have checked his iron levels before that have been okay.   We have checked his thyroid in the past that has also been okay.  We have checked his testosterone level that has also been normal.    Overall, his performance status is ECOG 1.  Medications:  Current Outpatient Medications:  .  allopurinol (ZYLOPRIM) 100 MG tablet, Take 1 tablet (100 mg total) by mouth daily., Disp: 30 tablet, Rfl: 1 .  ALPRAZolam (XANAX) 0.5 MG tablet, Take 1 tablet (0.5 mg total) by mouth 3 (three) times daily as needed for anxiety., Disp: 90 tablet, Rfl: 2 .  azelastine (ASTELIN) 0.1 % nasal spray, Place 2 sprays into both nostrils 2 (two) times daily. Use in each nostril as directed, Disp: 30 mL, Rfl: 6 .  Calcium 500 MG tablet, Take 600 mg by mouth daily., Disp: , Rfl:  .  Cholecalciferol (VITAMIN D3) 2000 UNITS capsule, Take 2,000 Units by mouth daily., Disp: , Rfl:  .  gabapentin (NEURONTIN) 300 MG capsule, TAKE 3 CAPSULES BY MOUTH THREE TIMES DAILY, PLUS 2 CAPSULES EVERY DAY AS NEEDED, Disp: 990 capsule, Rfl: 0 .   HYDROcodone-acetaminophen (NORCO) 7.5-325 MG tablet, Take 1 tablet by mouth every 6 (six) hours as needed for moderate pain., Disp: 100 tablet, Rfl: 0 .  Multiple Vitamin (MULTIVITAMIN) tablet, Take 1 tablet by mouth daily., Disp: , Rfl:  .  niacin 500 MG tablet, Take 500 mg by mouth at bedtime., Disp: , Rfl:  .  Omega-3 Fatty Acids (OMEGA-3 FISH OIL PO), Take 2 each by mouth every morning. EPA/DHA 520/350, Disp: , Rfl:  .  polyethylene glycol (MIRALAX / GLYCOLAX) packet, Take 17 g by mouth daily as needed., Disp: , Rfl:  .  predniSONE (DELTASONE) 20 MG tablet, 2 tabs po daily x 4 days, Disp: 8 tablet, Rfl: 0 .  psyllium (METAMUCIL) 58.6 % powder, Take 1 packet by mouth daily. 1 Teaspoon daily, Disp: , Rfl:  .  S-Adenosylmethionine (SAM-E) 400 MG TABS, Take 400 mg by mouth daily., Disp: , Rfl:  .  vitamin C (ASCORBIC ACID) 500 MG tablet, Take 500 mg by mouth daily., Disp: , Rfl:  .  zinc gluconate 50 MG tablet, Take 50 mg by mouth daily., Disp: , Rfl:  .  amoxicillin (AMOXIL) 500 MG capsule, Take 4 caps (2gm) by mouth once prior to dental procedure (Patient not taking: Reported on 11/19/2017), Disp: 20 capsule, Rfl: 0  Allergies:  Allergies  Allergen Reactions  .  Nsaids Other (See Comments) and Tinitus    Bruising   . Tolmetin Other (See Comments)    Bruising  . Ciprofloxacin Other (See Comments)    Other reaction(s): Other (See Comments) Achilles tendon Achilles tendon  . Statins Nausea And Vomiting and Other (See Comments)    Other reaction(s): Kidney issues Muscle pain Muscle pain Other reaction(s): Kidney issues Muscle pain  . Requip [Ropinirole Hcl] Anxiety  . Ropinirole Anxiety    Mood swing    Past Medical History, Surgical history, Social history, and Family History were reviewed and updated.  Review of Systems: Review of Systems  Constitutional: Positive for malaise/fatigue.  HENT: Negative.   Eyes: Negative.   Respiratory: Positive for cough.   Cardiovascular:  Negative.   Gastrointestinal: Negative.   Genitourinary: Negative.   Musculoskeletal: Negative.   Skin: Negative.   Neurological: Negative.   Endo/Heme/Allergies: Negative.   Psychiatric/Behavioral: Negative.      Physical Exam:  weight is 218 lb 4 oz (99 kg). His oral temperature is 97.4 F (36.3 C) (abnormal). His blood pressure is 130/83 and his pulse is 94. His respiration is 16 and oxygen saturation is 97%.   Wt Readings from Last 3 Encounters:  11/19/17 218 lb 4 oz (99 kg)  11/05/17 213 lb (96.6 kg)  10/14/17 212 lb (96.2 kg)     Physical Exam  Constitutional: He is oriented to person, place, and time.  HENT:  Head: Normocephalic and atraumatic.  Mouth/Throat: Oropharynx is clear and moist.  Eyes: Pupils are equal, round, and reactive to light. EOM are normal.  Neck: Normal range of motion.  Cardiovascular: Normal rate, regular rhythm and normal heart sounds.  Pulmonary/Chest: Effort normal and breath sounds normal.  Abdominal: Soft. Bowel sounds are normal.  Musculoskeletal: Normal range of motion. He exhibits no edema, tenderness or deformity.  Lymphadenopathy:    He has no cervical adenopathy.  Neurological: He is alert and oriented to person, place, and time.  Skin: Skin is warm and dry. No rash noted. No erythema.  Psychiatric: He has a normal mood and affect. His behavior is normal. Judgment and thought content normal.  Vitals reviewed.   Lab Results  Component Value Date   WBC 5.2 11/19/2017   HGB 11.1 (L) 11/19/2017   HCT 33.5 (L) 11/19/2017   MCV 107.4 (H) 11/19/2017   PLT 117 (L) 11/19/2017     Chemistry      Component Value Date/Time   NA 142 11/19/2017 1050   NA 139 05/08/2017 1123   NA 141 05/15/2016 1126   K 3.7 11/19/2017 1050   K 4.0 05/08/2017 1123   K 4.3 05/15/2016 1126   CL 106 11/19/2017 1050   CL 105 05/08/2017 1123   CO2 28 11/19/2017 1050   CO2 30 05/08/2017 1123   CO2 26 05/15/2016 1126   BUN 22 11/19/2017 1050   BUN 15  05/08/2017 1123   BUN 29.1 (H) 05/15/2016 1126   CREATININE 1.40 (H) 11/19/2017 1050   CREATININE 1.2 05/08/2017 1123   CREATININE 1.7 (H) 05/15/2016 1126   GLU 90 11/15/2014      Component Value Date/Time   CALCIUM 9.3 11/19/2017 1050   CALCIUM 9.1 05/08/2017 1123   CALCIUM 9.7 05/15/2016 1126   ALKPHOS 67 11/19/2017 1050   ALKPHOS 64 05/08/2017 1123   ALKPHOS 69 05/15/2016 1126   AST 22 11/19/2017 1050   AST 23 05/15/2016 1126   ALT 23 11/19/2017 1050   ALT 19 05/08/2017 1123  ALT 22 05/15/2016 1126   BILITOT 0.9 11/19/2017 1050   BILITOT 0.84 05/15/2016 1126         Impression and Plan: Mr. Hedding is a 84 year old white male. He had a history of diffuse large cell non-Hodgkin's lymphoma. He was treated  with 6 cycles of R-CHOP. He got this treatment up in [place]. He completed this back in April 2014.   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 This could be a residual from his chemotherapy for his lymphoma. However, I would think that this is a little bit early to show up if it was from his chemotherapy.  For right now, we are focusing on his quality of life.  Again, I do not see any changes with his blood counts or with any change in his blood smear that would suggest his chronic myelomonocytic leukemia is becoming active.  I think we can get him through the summertime now.  I will plan to see him back in September.  [name], MD 5/28/201912:05 PM

## 2017-11-19 NOTE — Telephone Encounter (Signed)
Indication for chronic opioid: back pain Medication and dose: hydrocodone 7.5 # pills per month: 100 Last UDS date: 07/2016 Pain contract signed (Y/N): yes Date narcotic database last reviewed (include red flags): today  NCCSR:  10/28/2017  1  08/16/2017  Alprazolam 0.5 Mg Tablet  90 30 Je Cop  [id number]  [facility] ([id number])  2 3.00 LME Comm Ins  [place]  10/09/2017  1  10/09/2017  Hydrocodone-Acetamin 7.5-325  100 25 Je Cop  [id number]  [facility] ([id number])  0 30.00 MME Comm Ins  [place]  09/25/2017  1  08/16/2017  Alprazolam 0.5 Mg Tablet  90 30 Je Cop  [id number]  [facility] ([id number])  1 3.00 LME Comm Ins  [place]  09/03/2017  1  09/03/2017  Hydrocodone-Acetamin 7.5-325  100 25 Je Cop  [id number]  [facility] ([id number])  0 30.00 MME Comm Ins  [place]  08/16/2017  1  08/16/2017  Alprazolam 0.5 Mg Tablet  90 30 Je Cop  [id number]  [facility] ([id number])  0 3.00 LME Comm Ins    07/11/2017  1  07/11/2017  Hydrocodone-Acetamin 7.5-325  100 25 Je Cop  [id number]  [facility] ([id number])  0

## 2017-11-20 LAB — IRON AND TIBC
IRON: 93 ug/dL (ref 42–163)
Saturation Ratios: 39 % — ABNORMAL LOW (ref 42–163)
TIBC: 239 ug/dL (ref 202–409)
UIBC: 146 ug/dL

## 2017-11-20 LAB — FERRITIN: FERRITIN: 186 ng/mL (ref 22–316)

## 2017-11-21 DIAGNOSIS — M6281 Muscle weakness (generalized): Secondary | ICD-10-CM | POA: Diagnosis not present

## 2017-11-21 DIAGNOSIS — M25511 Pain in right shoulder: Secondary | ICD-10-CM | POA: Diagnosis not present

## 2017-11-21 DIAGNOSIS — R2689 Other abnormalities of gait and mobility: Secondary | ICD-10-CM | POA: Diagnosis not present

## 2017-11-26 ENCOUNTER — Encounter: Payer: Self-pay | Admitting: Family Medicine

## 2017-11-26 DIAGNOSIS — G2581 Restless legs syndrome: Secondary | ICD-10-CM

## 2017-11-26 DIAGNOSIS — G4700 Insomnia, unspecified: Secondary | ICD-10-CM

## 2017-11-26 MED ORDER — ALPRAZOLAM 0.5 MG PO TABS: 0.5000 mg | ORAL_TABLET | Freq: Three times a day (TID) | ORAL | 2 refills | Status: DC | PRN

## 2017-11-26 NOTE — Telephone Encounter (Signed)
NCCSR is not functioning from my home currently.  However pr notes he is ok for refill, will RF now

## 2017-11-26 NOTE — Telephone Encounter (Signed)
Requesting:Alprazolam Contract:09/25/17 UDS:07/2016 Last Visit:09/25/17 Next Visit:none Last Refill:08/16/17 2-refills  Please Advise

## 2017-11-29 ENCOUNTER — Other Ambulatory Visit (INDEPENDENT_AMBULATORY_CARE_PROVIDER_SITE_OTHER): Payer: Self-pay | Admitting: Radiology

## 2017-11-29 ENCOUNTER — Encounter (INDEPENDENT_AMBULATORY_CARE_PROVIDER_SITE_OTHER): Payer: Self-pay | Admitting: Orthopaedic Surgery

## 2017-11-29 ENCOUNTER — Ambulatory Visit (INDEPENDENT_AMBULATORY_CARE_PROVIDER_SITE_OTHER): Payer: Medicare Other | Admitting: Orthopaedic Surgery

## 2017-11-29 VITALS — BP 122/62 | HR 89 | Ht 69.5 in | Wt 218.0 lb

## 2017-11-29 DIAGNOSIS — R0781 Pleurodynia: Secondary | ICD-10-CM

## 2017-11-29 NOTE — Progress Notes (Signed)
Office Visit Note   Patient: Jeremiah Walker           Date of Birth: May 01, 1928           MRN: [id number] Visit Date: 11/29/2017              Requested by: [name], MD [address] [place], [place] [address] PCP: [name], MD   Assessment & Plan: Visit Diagnoses:  1. Rib pain on right side     Plan: Pain right chest wall that  appears to be clinically related to 1 of the proximal ribs.  X-rays negative.  Will order CT scan of right chest wall.  Also does not appear to be related to the scapular or shoulder  Follow-Up Instructions: Return after CT right rib cage.   Orders:  No orders of the defined types were placed in this encounter.  No orders of the defined types were placed in this encounter.     Procedures: No procedures performed   Clinical Data: No additional findings.   Subjective: Chief Complaint  Patient presents with  . Follow-up    RIGHT RIB PAIN GOTTEN WORSE THE LAST FEW DAYS, NO INJURY  Jeremiah Walker was recently seen for evaluation of pain he was experiencing in the area of his right chest wall.  Pain seems to originate in the area of the mid axillary line radiating to his nipple.  He notes he is a little bit better over the last several weeks but still having some discomfort with certain motions.  Pain does not appear to be related to shoulder motion or neck motion.  Prior evaluation was essentially negative in terms of shoulder pain or scapular discomfort.  X-rays of his chest wall and ribs were also negative.  Related shortness of breath.  No injury or trauma.  HPI  Review of Systems  Constitutional: Positive for fatigue. Negative for fever.  HENT: Negative for ear pain.   Eyes: Negative for pain.  Respiratory: Negative for cough and shortness of breath.   Cardiovascular: Negative for leg swelling.  Gastrointestinal: Negative for constipation and diarrhea.  Genitourinary: Negative for difficulty urinating.    Musculoskeletal: Positive for back pain. Negative for neck pain.  Skin: Negative for rash.  Allergic/Immunologic: Negative for food allergies.  Neurological: Negative for weakness and numbness.  Hematological: Bruises/bleeds easily.  Psychiatric/Behavioral: Negative for sleep disturbance.     Objective: Vital Signs: BP 122/62 (BP Location: Left Arm, Patient Position: Sitting, Cuff Size: Normal)   Pulse 89   Ht 5' 9.5" (1.765 m)   Wt 218 lb (98.9 kg)   BMI 31.73 kg/m   Physical Exam  Constitutional: He is oriented to person, place, and time. He appears well-developed and well-nourished.  HENT:  Mouth/Throat: Oropharynx is clear and moist.  Eyes: Pupils are equal, round, and reactive to light. EOM are normal.  Pulmonary/Chest: Effort normal.  Neurological: He is alert and oriented to person, place, and time.  Skin: Skin is warm and dry.  Psychiatric: He has a normal mood and affect. His behavior is normal.    Ortho Exam awake alert and oriented x3.  Comfortable sitting no pain range of motion of his right shoulder.  Negative impingement.  Able to quickly place right arm overhead.  No pain range of motion cervical spine.  No pain about the right scapula.  Has an area of tenderness in the area of a rib or an intercostal region in the  mid axillary line about a handbreadth below the axilla.  No masses.  No crepitation.  No skin change.  No percussible tenderness of the cervical or thoracic spine  Specialty Comments:  No specialty comments available.  Imaging: No results found.   PMFS History: Patient Active Problem List   Diagnosis Date Noted  . 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 Chronic pain syndrome 09/26/2015  . De Quervain's tenosynovitis, right 09/13/2015  . Anemia due to antineoplastic chemotherapy 08/15/2015  . DLBCL (diffuse large B cell lymphoma) ([place]) 08/15/2015  . Skin lesion of face 05/21/2015  . Peripheral  neuropathy due to chemotherapy ([place]) 02/09/2015  . Benign prostatic hyperplasia with urinary obstruction 02/09/2015  . Bilateral hearing loss 02/09/2015  . Low back pain 02/09/2015  . Chronic kidney disease (CKD), stage III (moderate) ([place]) 02/09/2015  . Essential (primary) hypertension 02/09/2015  . Gastro-esophageal reflux disease without esophagitis 02/09/2015  . Anxiety, generalized 02/09/2015  . Cannot sleep 02/09/2015  . Combined fat and carbohydrate induced hyperlipemia 02/09/2015   Past Medical History:  Diagnosis Date  . Anemia   . Anemia due to antineoplastic chemotherapy 08/15/2015  . Anxiety   . Arthritis   . Cancer ([place])   . Cataract   . Chronic kidney disease (CKD), stage III (moderate) ([facility]) 02/09/2015   Controlled  . CMML (chronic myelomonocytic leukemia) ([place])   . CMML (chronic myelomonocytic leukemia) ([place])   . Combined fat and carbohydrate induced hyperlipemia 02/09/2015   Controlled  . Depression   . DLBCL (diffuse large B cell lymphoma) ([place]) 08/15/2015  . Essential (primary) hypertension 02/09/2015  . Gastro-esophageal reflux disease without esophagitis 02/09/2015   Controlled  . Neuromuscular disorder ([place])   . Tinnitus     Family History  Problem Relation Age of Onset  . Anuerysm Mother 25       Deceased  . Colon cancer Father 48       Deceased  . Colon cancer Brother   . Prostate cancer Brother        Deceased  . Alzheimer's disease Brother   . Heart disease Brother   . Alzheimer's disease Paternal Grandmother   . Early death Maternal Grandmother        Unknown  . Obesity Son   . Obesity Son        Gastric Bypass  . Healthy Son   . Healthy Daughter   . Diabetes Neg Hx     Past Surgical History:  Procedure Laterality Date  . CATARACT EXTRACTION, BILATERAL    . COLONOSCOPY  May 2011  . CYSTOSCOPY    . TOTAL HIP ARTHROPLASTY  2003   Right   . TOTAL HIP ARTHROPLASTY  April, 2011   Left  . TRIGGER FINGER RELEASE  Nov 2013  . TURBINECTOMY   2006  . UPPER GI ENDOSCOPY  Nov 2015   Social History   Occupational History  . Not on file  Tobacco Use  . Smoking status: Former Research scientist (life sciences)  . Smokeless tobacco: Never Used  . Tobacco comment: Quit >50 years ago  Substance and Sexual Activity  . Alcohol use: Yes    Alcohol/week: 0.6 - 2.4 oz    Types: 1 - 4 Shots of liquor per week    Comment: 4 glasses per week  . Drug use: No  . Sexual activity: Not on file

## 2017-12-02 ENCOUNTER — Other Ambulatory Visit: Payer: Self-pay

## 2017-12-02 DIAGNOSIS — M1000 Idiopathic gout, unspecified site: Secondary | ICD-10-CM

## 2017-12-02 MED ORDER — ALLOPURINOL 100 MG PO TABS: 100.0000 mg | ORAL_TABLET | Freq: Every day | ORAL | 5 refills | Status: DC

## 2017-12-10 ENCOUNTER — Other Ambulatory Visit: Payer: Medicare Other

## 2017-12-10 DIAGNOSIS — G894 Chronic pain syndrome: Secondary | ICD-10-CM

## 2017-12-10 NOTE — Progress Notes (Addendum)
Jeremiah Walker at [facility] [address] [place], [place] [address] [phone]  Date:  12/11/2017   Name:  Jeremiah Walker   DOB:  09-20-1927   MRN:  [id number]  PCP:  [name], MD    Chief Complaint: No chief complaint on file.   History of Present Illness:  Jeremiah Walker is a 84 y.o. very pleasant male patient who presents with the following:  Here today for a sick visit- feeling tired.  He also complained of this to Jeremiah Walker at his last oncology visit as well, but notes that his fatigue is even worse over the last several days  History of CML, CKD, HTN I last saw him in April Per oncology note from last month: Jeremiah Walker is a 84 year old white male. He had a history of diffuse large cell non-Hodgkin's lymphoma. He was treated  with 6 cycles of R-CHOP. He got this treatment up in [place]. He completed this back in April 2014.  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 This could be a residual from his chemotherapy for his lymphoma. However, I would think that this is a little bit early to show up if it was from his chemotherapy. For right now, we are focusing on his quality of life.  Again, I do not see any changes with his blood counts or with any change in his blood smear that would suggest his chronic myelomonocytic leukemia is becoming active.  Pt notes that he is feeling tired.  He feels "like I have anemia, but I know that I do have anemia."  He has felt more tired just for the last 4 days He pain or fevers No vomiting or diarrhea  No urinary sx Eating normally, good appetite   He notes that he will take a melatonin, 2 xanax and some sort of OTC sleep pill to get to sleep He is not aware of snoring  He does sleep in a chair due to chronic pain in his right chest for which he is seeing Jeremiah Walker and they plan to do a CT: Plan: Pain right chest wall that  appears to be clinically related to 1 of the  proximal ribs.  X-rays negative.  Will order CT scan of right chest wall.  Also does not appear to be related to the scapular or shoulder  No chest pain or SOB He had to cut his walk for exercise short the last few days, but this was not related to any CV sx per his report   He is able to drive fine He does take a vitamin D supplement- ?told that he was deficient at some point in the past Recent labs show minimal anemia, stable renal function   Patient Active Problem List   Diagnosis Date Noted  . 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 Chronic pain syndrome 09/26/2015  . De Quervain's tenosynovitis, right 09/13/2015  . Anemia due to antineoplastic chemotherapy 08/15/2015  . DLBCL (diffuse large B cell lymphoma) ([place]) 08/15/2015  . Skin lesion of face 05/21/2015  . Peripheral neuropathy due to chemotherapy ([place]) 02/09/2015  . Benign prostatic hyperplasia with urinary obstruction 02/09/2015  . Bilateral hearing loss 02/09/2015  . Low back pain 02/09/2015  . Chronic kidney disease (CKD), stage III (moderate) ([place]) 02/09/2015  . Essential (primary) hypertension 02/09/2015  . Gastro-esophageal reflux disease without esophagitis 02/09/2015  . Anxiety, generalized 02/09/2015  . Cannot sleep  02/09/2015  . Combined fat and carbohydrate induced hyperlipemia 02/09/2015    Past Medical History:  Diagnosis Date  . Anemia   . Anemia due to antineoplastic chemotherapy 08/15/2015  . Anxiety   . Arthritis   . Cancer ([place])   . Cataract   . Chronic kidney disease (CKD), stage III (moderate) ([facility]) 02/09/2015   Controlled  . CMML (chronic myelomonocytic leukemia) ([place])   . CMML (chronic myelomonocytic leukemia) ([place])   . Combined fat and carbohydrate induced hyperlipemia 02/09/2015   Controlled  . Depression   . DLBCL (diffuse large B cell lymphoma) ([place]) 08/15/2015  . Essential (primary) hypertension 02/09/2015  . Gastro-esophageal reflux disease  without esophagitis 02/09/2015   Controlled  . Neuromuscular disorder ([place])   . Tinnitus     Past Surgical History:  Procedure Laterality Date  . CATARACT EXTRACTION, BILATERAL    . COLONOSCOPY  May 2011  . CYSTOSCOPY    . TOTAL HIP ARTHROPLASTY  2003   Right   . TOTAL HIP ARTHROPLASTY  April, 2011   Left  . TRIGGER FINGER RELEASE  Nov 2013  . TURBINECTOMY  2006  . UPPER GI ENDOSCOPY  Nov 2015    Social History   Tobacco Use  . Smoking status: Former Research scientist (life sciences)  . Smokeless tobacco: Never Used  . Tobacco comment: Quit >50 years ago  Substance Use Topics  . Alcohol use: Yes    Alcohol/week: 0.6 - 2.4 oz    Types: 1 - 4 Shots of liquor per week    Comment: 4 glasses per week  . Drug use: No    Family History  Problem Relation Age of Onset  . Anuerysm Mother 3       Deceased  . Colon cancer Father 54       Deceased  . Colon cancer Brother   . Prostate cancer Brother        Deceased  . Alzheimer's disease Brother   . Heart disease Brother   . Alzheimer's disease Paternal Grandmother   . Early death Maternal Grandmother        Unknown  . Obesity Son   . Obesity Son        Gastric Bypass  . Healthy Son   . Healthy Daughter   . Diabetes Neg Hx     Allergies  Allergen Reactions  . Nsaids Other (See Comments) and Tinitus    Bruising   . Tolmetin Other (See Comments)    Bruising  . Ciprofloxacin Other (See Comments)    Other reaction(s): Other (See Comments) Achilles tendon Achilles tendon  . Statins Nausea And Vomiting and Other (See Comments)    Other reaction(s): Kidney issues Muscle pain Muscle pain Other reaction(s): Kidney issues Muscle pain  . Requip [Ropinirole Hcl] Anxiety  . Ropinirole Anxiety    Mood swing    Medication list has been reviewed and updated.  Current Outpatient Medications on File Prior to Visit  Medication Sig Dispense Refill  . allopurinol (ZYLOPRIM) 100 MG tablet Take 1 tablet (100 mg total) by mouth daily. 30 tablet 5   . ALPRAZolam (XANAX) 0.5 MG tablet Take 1 tablet (0.5 mg total) by mouth 3 (three) times daily as needed for anxiety. 90 tablet 2  . amoxicillin (AMOXIL) 500 MG capsule Take 4 caps (2gm) by mouth once prior to dental procedure 20 capsule 0  . azelastine (ASTELIN) 0.1 % nasal spray Place 2 sprays into both nostrils 2 (two) times daily. Use in each nostril as  directed 30 mL 6  . Calcium 500 MG tablet Take 600 mg by mouth daily.    . Cholecalciferol (VITAMIN D3) 2000 UNITS capsule Take 2,000 Units by mouth daily.    Marland Kitchen gabapentin (NEURONTIN) 300 MG capsule TAKE 3 CAPSULES BY MOUTH THREE TIMES DAILY, PLUS 2 CAPSULES EVERY DAY AS NEEDED 990 capsule 0  . HYDROcodone-acetaminophen (NORCO) 7.5-325 MG tablet Take 1 tablet by mouth every 6 (six) hours as needed for moderate pain. 100 tablet 0  . Multiple Vitamin (MULTIVITAMIN) tablet Take 1 tablet by mouth daily.    . niacin 500 MG tablet Take 500 mg by mouth at bedtime.    . Omega-3 Fatty Acids (OMEGA-3 FISH OIL PO) Take 2 each by mouth every morning. EPA/DHA 520/350    . polyethylene glycol (MIRALAX / GLYCOLAX) packet Take 17 g by mouth daily as needed.    . predniSONE (DELTASONE) 20 MG tablet 2 tabs po daily x 4 days 8 tablet 0  . psyllium (METAMUCIL) 58.6 % powder Take 1 packet by mouth daily. 1 Teaspoon daily    . S-Adenosylmethionine (SAM-E) 400 MG TABS Take 400 mg by mouth daily.    . vitamin C (ASCORBIC ACID) 500 MG tablet Take 500 mg by mouth daily.    Marland Kitchen zinc gluconate 50 MG tablet Take 50 mg by mouth daily.     No current facility-administered medications on file prior to visit.     Review of Systems:  As per HPI- otherwise negative. He cannot think of any cause of his fatigue Feels well physically otherwise   Physical Examination: Vitals:   12/11/17 1444  BP: 130/70  Pulse: 76  Resp: 16  Temp: 98.3 F (36.8 C)  SpO2: 96%   Vitals:   12/11/17 1444  Weight: 220 lb 12.8 oz (100.2 kg)  Height: 5' 9.5" (1.765 m)   Body mass  index is 32.14 kg/m. Ideal Body Weight: Weight in (lb) to have BMI = 25: 171.4  GEN: WDWN, NAD, Non-toxic, A & O x 3, central obesity, otherwise looks well  HEENT: Atraumatic, Normocephalic. Neck supple. No masses, No LAD.  Bilateral TM wnl, oropharynx normal.  PEERL,EOMI.   Ears and Nose: No external deformity. CV: RRR, No M/G/R. No JVD. No thrill. No extra heart sounds. PULM: CTA B, no wheezes, crackles, rhonchi. No retractions. No resp. distress. No accessory muscle use. ABD: S, NT, ND EXTR: No c/c/e NEURO Normal gait.   Does use a walker for support  PSYCH: Normally interactive. Conversant. Not depressed or anxious appearing.  Calm demeanor.   Results for orders placed or performed in visit on 12/11/17  POCT urinalysis dipstick  Result Value Ref Range   Color, UA yellow yellow   Clarity, UA clear clear   Glucose, UA negative negative mg/dL   Bilirubin, UA negative negative   Ketones, POC UA negative negative mg/dL   Spec Grav, UA 1.020 1.010 - 1.025   Blood, UA small (A) negative   pH, UA 6.0 5.0 - 8.0   Protein Ur, POC trace (A) negative mg/dL   Urobilinogen, UA 1.0 0.2 or 1.0 E.U./dL   Nitrite, UA Negative Negative   Leukocytes, UA Negative Negative   Pt notes a long history of microhematuria  Assessment and Plan: Chronic fatigue - Plan: POCT urinalysis dipstick, TSH, Vitamin D (25 hydroxy)  Microhematuria - Plan: Urine Culture, Urine Microscopic Only  Vitamin D deficiency - Plan: Vitamin D (25 hydroxy)  Attempt to find any other cause of his fatigue.  Urine culture pending Vit D and TSH pending Suggested that he try a topical lidocaine patch for his chest pain  Signed Lamar Blinks, MD 6/20- Received labs so far, await culture  Results for orders placed or performed in visit on 12/11/17  Urine Microscopic Only  Result Value Ref Range   WBC, UA 0-2/hpf 0-2/hpf   RBC / HPF 0-2/hpf 0-2/hpf   Squamous Epithelial / LPF Rare(0-4/hpf) Rare(0-4/hpf)   Renal  Epithel, UA Rare(0-4/hpf) (A) None   Bacteria, UA Rare(<10/hpf) (A) None   Ca Oxalate Crys, UA Presence of (A) None  TSH  Result Value Ref Range   TSH 0.95 0.35 - 4.50 uIU/mL  Vitamin D (25 hydroxy)  Result Value Ref Range   VITD 45.13 30.00 - 100.00 ng/mL  POCT urinalysis dipstick  Result Value Ref Range   Color, UA yellow yellow   Clarity, UA clear clear   Glucose, UA negative negative mg/dL   Bilirubin, UA negative negative   Ketones, POC UA negative negative mg/dL   Spec Grav, UA 1.020 1.010 - 1.025   Blood, UA small (A) negative   pH, UA 6.0 5.0 - 8.0   Protein Ur, POC trace (A) negative mg/dL   Urobilinogen, UA 1.0 0.2 or 1.0 E.U./dL   Nitrite, UA Negative Negative   Leukocytes, UA Negative Negative   Received culture as well 6/21, negative MICRO NUMBER: [id number]   SPECIMEN QUALITY: ADEQUATE   Sample Source URINE   STATUS: FINAL   ISOLATE 1: Single organism less than 10,000 CFU/mL isolated. These organisms, commonly found on external and internal genitalia, are considered colonizers. No further testing performed.      Message to pt

## 2017-12-11 ENCOUNTER — Ambulatory Visit (INDEPENDENT_AMBULATORY_CARE_PROVIDER_SITE_OTHER): Payer: Medicare Other | Admitting: Family Medicine

## 2017-12-11 ENCOUNTER — Encounter: Payer: Self-pay | Admitting: Family Medicine

## 2017-12-11 VITALS — BP 130/70 | HR 76 | Temp 98.3°F | Resp 16 | Ht 69.5 in | Wt 220.8 lb

## 2017-12-11 DIAGNOSIS — E559 Vitamin D deficiency, unspecified: Secondary | ICD-10-CM

## 2017-12-11 DIAGNOSIS — R5382 Chronic fatigue, unspecified: Secondary | ICD-10-CM | POA: Diagnosis not present

## 2017-12-11 DIAGNOSIS — G894 Chronic pain syndrome: Secondary | ICD-10-CM

## 2017-12-11 DIAGNOSIS — R3129 Other microscopic hematuria: Secondary | ICD-10-CM | POA: Diagnosis not present

## 2017-12-11 LAB — POCT URINALYSIS DIP (MANUAL ENTRY)
Bilirubin, UA: NEGATIVE
GLUCOSE UA: NEGATIVE mg/dL
Ketones, POC UA: NEGATIVE mg/dL
LEUKOCYTES UA: NEGATIVE
NITRITE UA: NEGATIVE
Spec Grav, UA: 1.02 (ref 1.010–1.025)
Urobilinogen, UA: 1 E.U./dL
pH, UA: 6 (ref 5.0–8.0)

## 2017-12-11 NOTE — Patient Instructions (Signed)
We will be in touch with your labs and urine asap If these are all normal will plan for a sleep study to look for any other cause of your fatigue  You might try a topical lidocaine patch for the pain in your side- [employer]onpas makes a patch which may be helpful for you

## 2017-12-12 ENCOUNTER — Encounter: Payer: Self-pay | Admitting: Family Medicine

## 2017-12-12 LAB — URINALYSIS, MICROSCOPIC ONLY

## 2017-12-12 LAB — URINE CULTURE
MICRO NUMBER: 90734366
SPECIMEN QUALITY:: ADEQUATE

## 2017-12-12 LAB — VITAMIN D 25 HYDROXY (VIT D DEFICIENCY, FRACTURES): VITD: 45.13 ng/mL (ref 30.00–100.00)

## 2017-12-12 LAB — TSH: TSH: 0.95 u[IU]/mL (ref 0.35–4.50)

## 2017-12-13 ENCOUNTER — Other Ambulatory Visit (INDEPENDENT_AMBULATORY_CARE_PROVIDER_SITE_OTHER): Payer: Self-pay | Admitting: Radiology

## 2017-12-13 ENCOUNTER — Encounter: Payer: Self-pay | Admitting: Family Medicine

## 2017-12-13 ENCOUNTER — Telehealth (INDEPENDENT_AMBULATORY_CARE_PROVIDER_SITE_OTHER): Payer: Self-pay | Admitting: Orthopaedic Surgery

## 2017-12-13 DIAGNOSIS — R0789 Other chest pain: Secondary | ICD-10-CM

## 2017-12-13 NOTE — Telephone Encounter (Signed)
Patient left a voicemail stating at his appointment on 11/29/17 Dr. Durward Fortes was going to send a referral for patient to get a CT scan.  Patient states he hasn't heard from anyone yet and is checking the status of the referral.

## 2017-12-14 LAB — PAIN MGMT, PROFILE 8 W/CONF, U
6 ACETYLMORPHINE: NEGATIVE ng/mL (ref <10)
ALCOHOL METABOLITES: NEGATIVE ng/mL (ref <500)
ALPHAHYDROXYTRIAZOLAM: NEGATIVE ng/mL (ref <50)
Alphahydroxyalprazolam: 258 ng/mL — ABNORMAL HIGH (ref <25)
Alphahydroxymidazolam: NEGATIVE ng/mL (ref <50)
Aminoclonazepam: NEGATIVE ng/mL (ref <25)
Amphetamines: NEGATIVE ng/mL (ref <500)
Benzodiazepines: POSITIVE ng/mL — AB (ref <100)
Buprenorphine, Urine: NEGATIVE ng/mL (ref <5)
COCAINE METABOLITE: NEGATIVE ng/mL (ref <150)
Codeine: NEGATIVE ng/mL (ref <50)
Creatinine: 108.1 mg/dL
HYDROXYETHYLFLURAZEPAM: NEGATIVE ng/mL (ref <50)
Hydrocodone: 738 ng/mL — ABNORMAL HIGH (ref <50)
Hydromorphone: 192 ng/mL — ABNORMAL HIGH (ref <50)
LORAZEPAM: NEGATIVE ng/mL (ref <50)
MDMA: NEGATIVE ng/mL (ref <500)
MORPHINE: NEGATIVE ng/mL (ref <50)
Marijuana Metabolite: NEGATIVE ng/mL (ref <20)
NORDIAZEPAM: NEGATIVE ng/mL (ref <50)
NORHYDROCODONE: 2056 ng/mL — AB (ref <50)
OPIATES: POSITIVE ng/mL — AB (ref <100)
OXAZEPAM: NEGATIVE ng/mL (ref <50)
Oxidant: NEGATIVE ug/mL (ref <200)
Oxycodone: NEGATIVE ng/mL (ref <100)
Temazepam: NEGATIVE ng/mL (ref <50)
pH: 6.52 (ref 4.5–9.0)

## 2017-12-16 ENCOUNTER — Ambulatory Visit
Admission: RE | Admit: 2017-12-16 | Discharge: 2017-12-16 | Disposition: A | Discharge status: Home / Self Care | Payer: Medicare Other | Source: Ambulatory Visit | Attending: Orthopaedic Surgery | Admitting: Orthopaedic Surgery

## 2017-12-16 DIAGNOSIS — R0789 Other chest pain: Secondary | ICD-10-CM | POA: Diagnosis not present

## 2017-12-16 MED ORDER — IOPAMIDOL (ISOVUE-300) INJECTION 61%
75.0000 mL | Freq: Once | INTRAVENOUS | Status: AC | PRN
  Administered 2017-12-16: 75 mL via INTRAVENOUS

## 2017-12-16 NOTE — Telephone Encounter (Signed)
REFERRAL SENT IN AND NOTIFIED PT

## 2017-12-24 ENCOUNTER — Encounter (INDEPENDENT_AMBULATORY_CARE_PROVIDER_SITE_OTHER): Payer: Self-pay | Admitting: Orthopaedic Surgery

## 2017-12-24 ENCOUNTER — Ambulatory Visit (INDEPENDENT_AMBULATORY_CARE_PROVIDER_SITE_OTHER): Payer: Medicare Other | Admitting: Orthopaedic Surgery

## 2017-12-24 DIAGNOSIS — G8929 Other chronic pain: Secondary | ICD-10-CM

## 2017-12-24 DIAGNOSIS — M25511 Pain in right shoulder: Secondary | ICD-10-CM | POA: Diagnosis not present

## 2017-12-24 MED ORDER — METHYLPREDNISOLONE ACETATE 40 MG/ML IJ SUSP
80.0000 mg | INTRAMUSCULAR | Status: AC | PRN
  Administered 2017-12-24: 80 mg

## 2017-12-24 MED ORDER — LIDOCAINE HCL 1 % IJ SOLN
2.0000 mL | INTRAMUSCULAR | Status: AC | PRN
  Administered 2017-12-24: 2 mL

## 2017-12-24 MED ORDER — BUPIVACAINE HCL 0.5 % IJ SOLN
2.0000 mL | INTRAMUSCULAR | Status: AC | PRN
  Administered 2017-12-24: 2 mL via INTRA_ARTICULAR

## 2017-12-24 NOTE — Progress Notes (Signed)
Office Visit Note   Patient: Jeremiah Walker           Date of Birth: 1928-04-08           MRN: [id number] Visit Date: 12/24/2017              Requested by: Jeremiah Mclean, MD Jeremiah Walker [address] [place], [place] [address] PCP: Jeremiah Mclean, MD   Assessment & Plan: Visit Diagnoses:  1. Chronic right shoulder pain     Plan: Jeremiah Walker continues to have pain along the proximal right chest wall.  I ordered a CT scan of his chest with contrast.  The impression was "no explanation for chest wall pain".  There was no fracture or bone lesion or visible foraminal impingement.  He does have a history of lymphoma but no adenopathy was identified.  There was evidence of aortic atherosclerosis and coronary arthrosclerosis.  He is aware of all of the above.  Continues to have some pain along the chest wall motion of his shoulder yet, he does not have any shoulder pain.  He is not having any discomfort referred from his cervical spine.  I did inject the subacromial space months ago with some temporary relief of his pain.  Today I have injected the glenohumeral joint with cortisone.  I will also order an MRI scan of the glenohumeral joint.  This could be a very unusual presentation shoulder pain.  Discussion with him over 25 to 30 minutes regarding all of the above.  Follow-Up Instructions: Return after MRI right shoulder.   Orders:  Orders Placed This Encounter  Procedures  . Large Joint Inj: R glenohumeral  . MR SHOULDER RIGHT WO CONTRAST   No orders of the defined types were placed in this encounter.     Procedures: Large Joint Inj: R glenohumeral on 12/24/2017 4:19 PM Indications: pain and diagnostic evaluation Details: 25 G 1.5 in needle, posterior approach  Arthrogram: No  Medications: 2 mL lidocaine 1 %; 2 mL bupivacaine 0.5 %; 80 mg methylPREDNISolone acetate 40 MG/ML Consent was given by the patient. Immediately prior to procedure a time out was called to verify  the correct patient, procedure, equipment, support staff and site/side marked as required. Patient was prepped and draped in the usual sterile fashion.       Clinical Data: No additional findings.   Subjective: Chief Complaint  Patient presents with  . Follow-up    CT CHEST REVIEW  Jeremiah Walker continues to have problem with proximal right rib cage pain.  It does not appear to be referred from his neck or his shoulder but does have some discomfort with motion of the shoulder across his chest or externally.  He does not have any subacromial pain or glenohumeral pain.  CT scan contrast was negative as mentioned above.  Days he has pain.  Some days he does not have pain.  He is quite frustrated with the discomfort.  HPI  Review of Systems  HENT: Negative for ear pain.   Eyes: Negative for pain.  Respiratory: Negative for cough and shortness of breath.   Cardiovascular: Negative for leg swelling.  Gastrointestinal: Negative for constipation and diarrhea.  Genitourinary: Negative for difficulty urinating.  Musculoskeletal: Positive for back pain and neck pain.  Skin: Negative for rash.  Allergic/Immunologic: Negative for food allergies.  Neurological: Positive for weakness and numbness.  Hematological: Bruises/bleeds easily.  Psychiatric/Behavioral: Negative for sleep disturbance.     Objective: Vital Signs:  BP (!) 146/67 (BP Location: Left Arm, Patient Position: Sitting, Cuff Size: Normal)   Pulse 87   Ht 5\' 9"  (1.753 m)   Wt 220 lb (99.8 kg)   BMI 32.49 kg/m   Physical Exam  Constitutional: He is oriented to person, place, and time. He appears well-developed and well-nourished.  HENT:  Mouth/Throat: Oropharynx is clear and moist.  Eyes: Pupils are equal, round, and reactive to light. EOM are normal.  Pulmonary/Chest: Effort normal.  Neurological: He is alert and oriented to person, place, and time.  Skin: Skin is warm and dry.  Psychiatric: He has a normal mood and  affect. His behavior is normal.    Ortho Exam awake alert and oriented x3.  Comfortable sitting.  Is somewhat hard of hearing.  No shortness of breath or chest pain.  He has full range of motion of his right shoulder with no crepitation.  Negative impingement.  When he crosses his right arm across his chest he will experience some pain in the mid axillary line to his nipple.  He has palpable pain along the rib cage.  No pain at the acromioclavicular joint.  Specialty Comments:  No specialty comments available.  Imaging: No results found.   PMFS History: Patient Active Problem List   Diagnosis Date Noted  . 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 Chronic pain syndrome 09/26/2015  . De Quervain's tenosynovitis, right 09/13/2015  . Anemia due to antineoplastic chemotherapy 08/15/2015  . DLBCL (diffuse large B cell lymphoma) ([place]) 08/15/2015  . Skin lesion of face 05/21/2015  . Peripheral neuropathy due to chemotherapy ([place]) 02/09/2015  . Benign prostatic hyperplasia with urinary obstruction 02/09/2015  . Bilateral hearing loss 02/09/2015  . Low back pain 02/09/2015  . Chronic kidney disease (CKD), stage III (moderate) ([place]) 02/09/2015  . Essential (primary) hypertension 02/09/2015  . Gastro-esophageal reflux disease without esophagitis 02/09/2015  . Anxiety, generalized 02/09/2015  . Cannot sleep 02/09/2015  . Combined fat and carbohydrate induced hyperlipemia 02/09/2015   Past Medical History:  Diagnosis Date  . Anemia   . Anemia due to antineoplastic chemotherapy 08/15/2015  . Anxiety   . Arthritis   . Cancer ([place])   . Cataract   . Chronic kidney disease (CKD), stage III (moderate) ([facility]) 02/09/2015   Controlled  . CMML (chronic myelomonocytic leukemia) ([place])   . CMML (chronic myelomonocytic leukemia) ([place])   . Combined fat and carbohydrate induced hyperlipemia 02/09/2015   Controlled  . Depression   . DLBCL (diffuse large B cell  lymphoma) ([place]) 08/15/2015  . Essential (primary) hypertension 02/09/2015  . Gastro-esophageal reflux disease without esophagitis 02/09/2015   Controlled  . Neuromuscular disorder ([place])   . Tinnitus     Family History  Problem Relation Age of Onset  . Anuerysm Mother 51       Deceased  . Colon cancer Father 18       Deceased  . Colon cancer Brother   . Prostate cancer Brother        Deceased  . Alzheimer's disease Brother   . Heart disease Brother   . Alzheimer's disease Paternal Grandmother   . Early death Maternal Grandmother        Unknown  . Obesity Son   . Obesity Son        Gastric Bypass  . Healthy Son   . Healthy Daughter   . Diabetes Neg Hx     Past Surgical History:  Procedure Laterality Date  .  CATARACT EXTRACTION, BILATERAL    . COLONOSCOPY  May 2011  . CYSTOSCOPY    . TOTAL HIP ARTHROPLASTY  2003   Right   . TOTAL HIP ARTHROPLASTY  April, 2011   Left  . TRIGGER FINGER RELEASE  Nov 2013  . TURBINECTOMY  2006  . UPPER GI ENDOSCOPY  Nov 2015   Social History   Occupational History  . Not on file  Tobacco Use  . Smoking status: Former Research scientist (life sciences)  . Smokeless tobacco: Never Used  . Tobacco comment: Quit >50 years ago  Substance and Sexual Activity  . Alcohol use: Yes    Alcohol/week: 0.6 - 2.4 oz    Types: 1 - 4 Shots of liquor per week    Comment: 4 glasses per week  . Drug use: No  . Sexual activity: Not on file

## 2017-12-30 ENCOUNTER — Telehealth (INDEPENDENT_AMBULATORY_CARE_PROVIDER_SITE_OTHER): Payer: Self-pay | Admitting: Orthopaedic Surgery

## 2017-12-30 NOTE — Telephone Encounter (Signed)
NOTIFIED PT REFERRAL WAS PUT IN 12/24/17 AND CAN TAKE 1-2 WEEKS TO RUN THROUGH INSURANCE AND WILL CALL TO SET UP APPT AS SOON AS IT IS APPROVED

## 2017-12-30 NOTE — Telephone Encounter (Signed)
Patient called to check the status of the MRI that Dr. Durward Fortes ordered.  Patient states he hasn't received a call to schedule the appointment.

## 2017-12-31 ENCOUNTER — Other Ambulatory Visit: Payer: Self-pay

## 2017-12-31 MED ORDER — GABAPENTIN 300 MG PO CAPS: ORAL_CAPSULE | ORAL | 0 refills | Status: DC

## 2018-01-03 ENCOUNTER — Ambulatory Visit (INDEPENDENT_AMBULATORY_CARE_PROVIDER_SITE_OTHER): Payer: Medicare Other | Admitting: Orthopaedic Surgery

## 2018-01-03 ENCOUNTER — Encounter (INDEPENDENT_AMBULATORY_CARE_PROVIDER_SITE_OTHER): Payer: Self-pay | Admitting: Orthopaedic Surgery

## 2018-01-03 VITALS — BP 125/71 | HR 67 | Ht 69.5 in | Wt 209.0 lb

## 2018-01-03 DIAGNOSIS — M25511 Pain in right shoulder: Secondary | ICD-10-CM

## 2018-01-03 DIAGNOSIS — G8929 Other chronic pain: Secondary | ICD-10-CM | POA: Diagnosis not present

## 2018-01-03 NOTE — Progress Notes (Signed)
Office Visit Note   Patient: Jeremiah Walker           Date of Birth: May 25, 1928           MRN: [id number] Visit Date: 01/03/2018              Requested by: [name], MD [address] [place], [place] [address] PCP: [name], MD   Assessment & Plan: Visit Diagnoses:  1. Chronic right shoulder pain     Plan: Jeremiah Walker has been followed recently for the pain is been experiencing along the right lateral chest wall.  Films are negative for any obvious pathology.  CT scan of the chest with contrast was also negative for any abnormality to explain his pain.  Was no fracture or bone lesion or visible foraminal impingement.  I had ordered an MRI scan of his right shoulder thinking that it may be some unusual presentation for shoulder pain.  In the meantime he is been performing some exercises and notes that his pain is virtually resolved..  Therefore, we will hold off on the MRI scan of the shoulder and have him continue with his exercises and plan to see him back as necessary.  He is quite happy  Follow-Up Instructions: Return if symptoms worsen or fail to improve.   Orders:  No orders of the defined types were placed in this encounter.  No orders of the defined types were placed in this encounter.     Procedures: No procedures performed   Clinical Data: No additional findings.   Subjective: Chief Complaint  Patient presents with  . Follow-up    R SHOULDER PAIN SEEMS TO BE GETTING BETTER BUT HAS SOME QUESTIONS  I have been following Jeremiah Walker recently for the onset of pain along the lateral right chest wall.  X-rays and CT scan were negative for any obvious pathology to explain his pain.  Think he might have an unusual presentation for shoulder etiology I ordered an MRI scan of his shoulder.  He started doing some exercises for his thoracic spine and relates that his pain has just slowly resolved.  He no longer is having any sharp pain or  shortness of breath or shoulder discomfort.  HPI  Review of Systems  Constitutional: Negative for fatigue and fever.  HENT: Negative for ear pain.   Eyes: Negative for pain.  Respiratory: Negative for cough and shortness of breath.   Cardiovascular: Negative for leg swelling.  Gastrointestinal: Negative for constipation and diarrhea.  Genitourinary: Negative for difficulty urinating.  Musculoskeletal: Negative for back pain and neck pain.  Skin: Negative for rash.  Allergic/Immunologic: Negative for food allergies.  Neurological: Positive for weakness. Negative for numbness.  Hematological: Bruises/bleeds easily.  Psychiatric/Behavioral: Negative for sleep disturbance.     Objective: Vital Signs: BP 125/71 (BP Location: Left Arm, Patient Position: Sitting, Cuff Size: Normal)   Pulse 67   Ht 5' 9.5" (1.765 m)   Wt 209 lb (94.8 kg)   BMI 30.42 kg/m   Physical Exam  Constitutional: He is oriented to person, place, and time. He appears well-developed and well-nourished.  HENT:  Mouth/Throat: Oropharynx is clear and moist.  Eyes: Pupils are equal, round, and reactive to light. EOM are normal.  Pulmonary/Chest: Effort normal.  Neurological: He is alert and oriented to person, place, and time.  Skin: Skin is warm and dry.  Psychiatric: He has a normal mood and affect. His behavior is normal.  Ortho Exam awake alert and oriented x3.  Comfortable sitting.  He no longer can reproduce the pain along posterior lateral and anterior right chest wall.  Seem to be in the distribution of the fifth or sixth rib.  There is no skin changes there is no blistering.  He did not have any pain with range of motion of his right shoulder.  No palpable discomfort.  Specialty Comments:  No specialty comments available.  Imaging: No results found.   PMFS History: Patient Active Problem List   Diagnosis Date Noted  . 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 Chronic pain syndrome 09/26/2015  . De Quervain's tenosynovitis, right 09/13/2015  . Anemia due to antineoplastic chemotherapy 08/15/2015  . DLBCL (diffuse large B cell lymphoma) ([place]) 08/15/2015  . Skin lesion of face 05/21/2015  . Peripheral neuropathy due to chemotherapy ([place]) 02/09/2015  . Benign prostatic hyperplasia with urinary obstruction 02/09/2015  . Bilateral hearing loss 02/09/2015  . Low back pain 02/09/2015  . Chronic kidney disease (CKD), stage III (moderate) ([place]) 02/09/2015  . Essential (primary) hypertension 02/09/2015  . Gastro-esophageal reflux disease without esophagitis 02/09/2015  . Anxiety, generalized 02/09/2015  . Cannot sleep 02/09/2015  . Combined fat and carbohydrate induced hyperlipemia 02/09/2015   Past Medical History:  Diagnosis Date  . Anemia   . Anemia due to antineoplastic chemotherapy 08/15/2015  . Anxiety   . Arthritis   . Cancer ([place])   . Cataract   . Chronic kidney disease (CKD), stage III (moderate) ([facility]) 02/09/2015   Controlled  . CMML (chronic myelomonocytic leukemia) ([place])   . CMML (chronic myelomonocytic leukemia) ([place])   . Combined fat and carbohydrate induced hyperlipemia 02/09/2015   Controlled  . Depression   . DLBCL (diffuse large B cell lymphoma) ([place]) 08/15/2015  . Essential (primary) hypertension 02/09/2015  . Gastro-esophageal reflux disease without esophagitis 02/09/2015   Controlled  . Neuromuscular disorder ([place])   . Tinnitus     Family History  Problem Relation Age of Onset  . Anuerysm Mother 44       Deceased  . Colon cancer Father 39       Deceased  . Colon cancer Brother   . Prostate cancer Brother        Deceased  . Alzheimer's disease Brother   . Heart disease Brother   . Alzheimer's disease Paternal Grandmother   . Early death Maternal Grandmother        Unknown  . Obesity Son   . Obesity Son        Gastric Bypass  . Healthy Son   . Healthy Daughter   . Diabetes Neg Hx     Past Surgical  History:  Procedure Laterality Date  . CATARACT EXTRACTION, BILATERAL    . COLONOSCOPY  May 2011  . CYSTOSCOPY    . TOTAL HIP ARTHROPLASTY  2003   Right   . TOTAL HIP ARTHROPLASTY  April, 2011   Left  . TRIGGER FINGER RELEASE  Nov 2013  . TURBINECTOMY  2006  . UPPER GI ENDOSCOPY  Nov 2015   Social History   Occupational History  . Not on file  Tobacco Use  . Smoking status: Former Research scientist (life sciences)  . Smokeless tobacco: Never Used  . Tobacco comment: Quit >50 years ago  Substance and Sexual Activity  . Alcohol use: Yes    Alcohol/week: 0.6 - 2.4 oz    Types: 1 - 4 Shots of liquor per week  Comment: 4 glasses per week  . Drug use: No  . Sexual activity: Not on file

## 2018-01-06 ENCOUNTER — Other Ambulatory Visit: Payer: Self-pay | Admitting: Family Medicine

## 2018-01-06 DIAGNOSIS — G894 Chronic pain syndrome: Secondary | ICD-10-CM

## 2018-01-06 MED ORDER — HYDROCODONE-ACETAMINOPHEN 7.5-325 MG PO TABS: 1.0000 | ORAL_TABLET | Freq: Four times a day (QID) | ORAL | 0 refills | Status: DC | PRN

## 2018-01-06 NOTE — Telephone Encounter (Signed)
Reviewed chart and NCCSr- ok to refill Last seen here and did UDS last month

## 2018-01-06 NOTE — Telephone Encounter (Signed)
Refill request for hydrocodone.

## 2018-01-16 ENCOUNTER — Telehealth (INDEPENDENT_AMBULATORY_CARE_PROVIDER_SITE_OTHER): Payer: Self-pay | Admitting: Radiology

## 2018-01-16 NOTE — Telephone Encounter (Signed)
Per Prentiss Bells at [facility], they have called patient and he says that he does not want to proceed with the MRI at this time, he is feeling better, and will call to schedule in future if needed. 7/12 FYI

## 2018-01-17 NOTE — Telephone Encounter (Signed)
thanks

## 2018-01-22 ENCOUNTER — Encounter: Payer: Self-pay | Admitting: Family Medicine

## 2018-01-22 MED ORDER — PREDNISONE 20 MG PO TABS: ORAL_TABLET | ORAL | 0 refills | Status: DC

## 2018-01-30 ENCOUNTER — Telehealth (INDEPENDENT_AMBULATORY_CARE_PROVIDER_SITE_OTHER): Payer: Self-pay | Admitting: *Deleted

## 2018-01-31 IMAGING — CT CT BIOPSY AND ASPIRATION BONE MARROW
1 of 4 series · 9 of 16 positions shown, 12 images · non-contrast
Comparison: none

INDICATION: 88-year-old with history of diffuse large cell non-Hodgkin's
lymphoma. Patient has monocytosis.

[Series 2: i-spiral 5.0 b40f · axial · 0.79mm/px · z∈[-89,+23]mm · 9 of 41 slices shown, 12 images]
[im 5/41  soft-tissue]
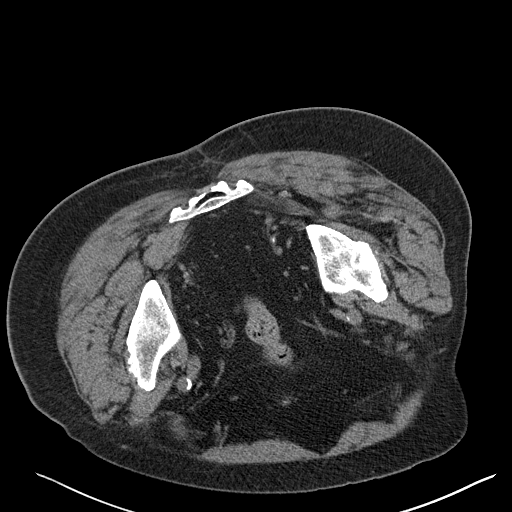
[im 5/41  bone]
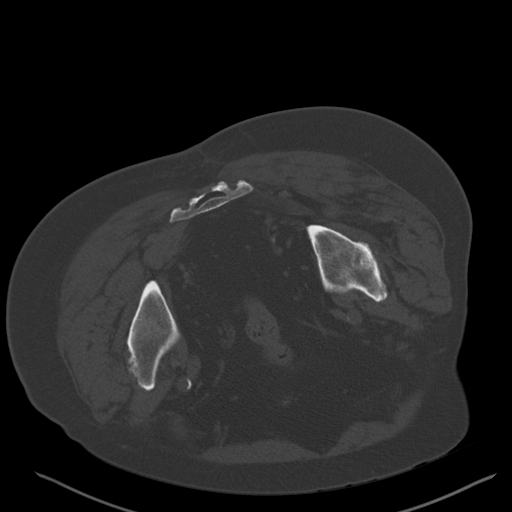
[im 9/41  soft-tissue]
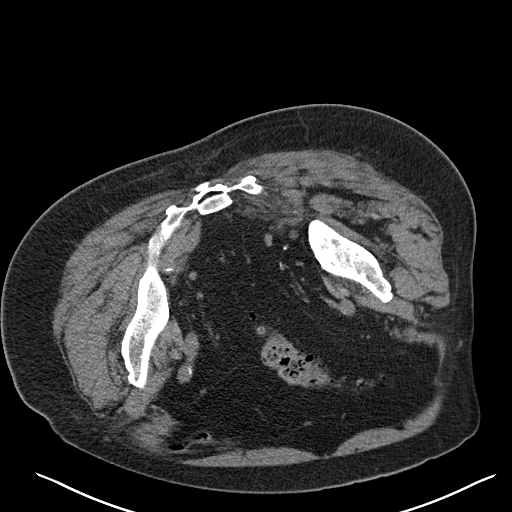
[im 13/41  soft-tissue]
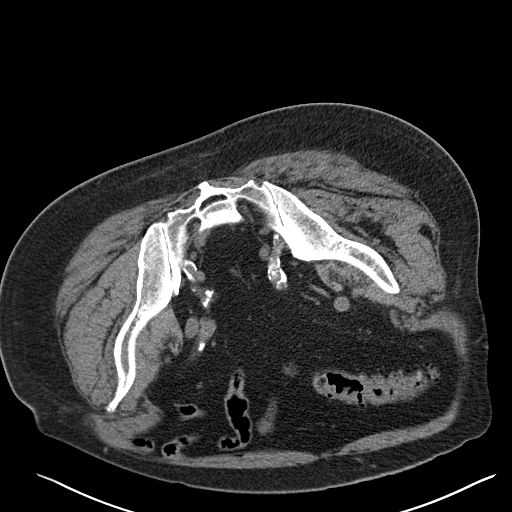
[im 17/41  soft-tissue]
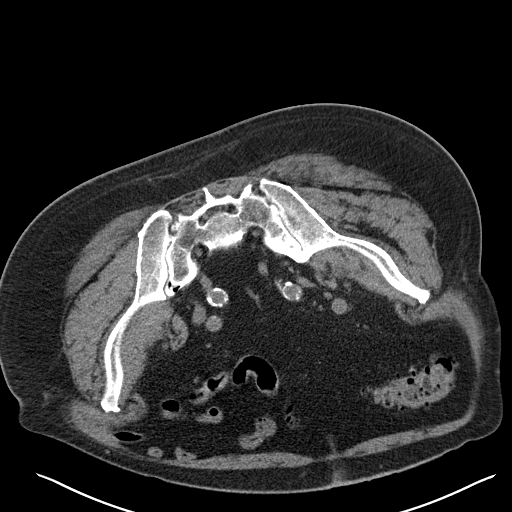
[im 21/41  soft-tissue]
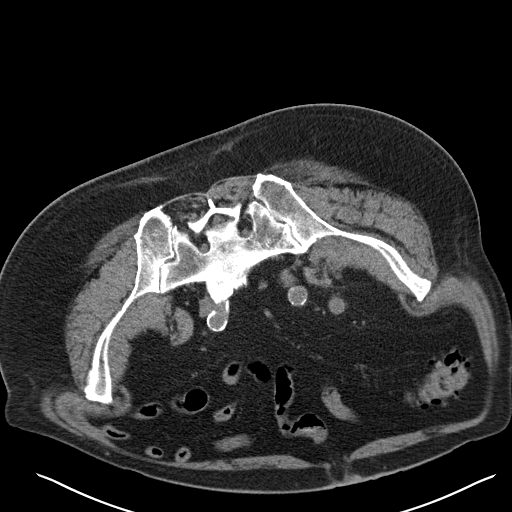
[im 21/41  bone]
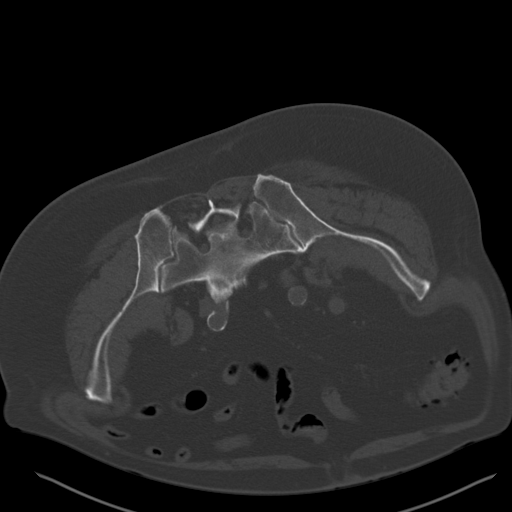
[im 25/41  soft-tissue]
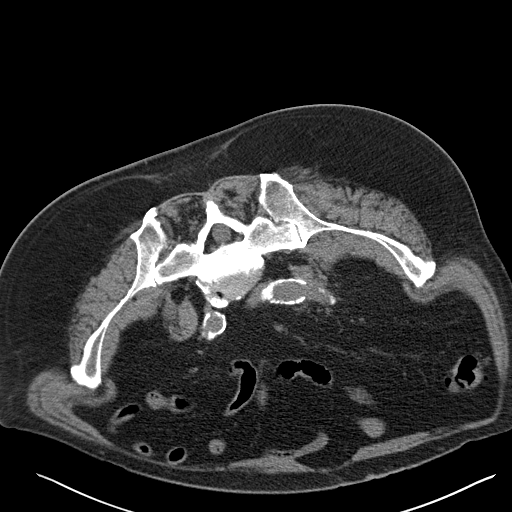
[im 29/41  soft-tissue]
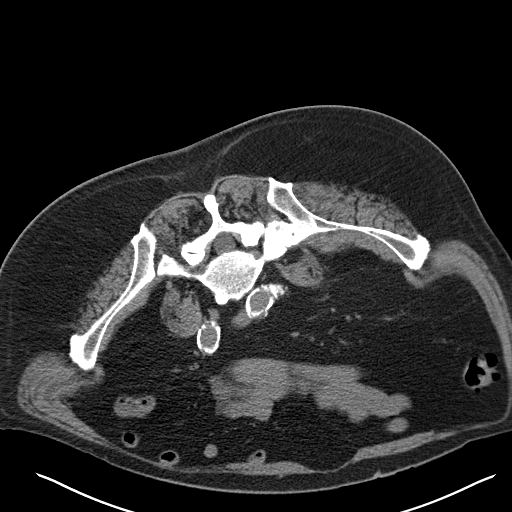
[im 33/41  soft-tissue]
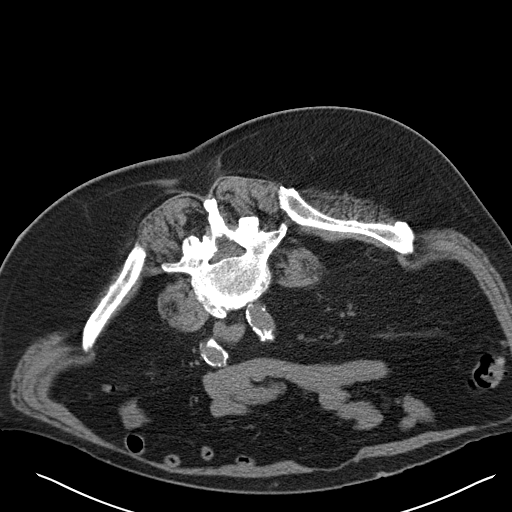
[im 37/41  soft-tissue]
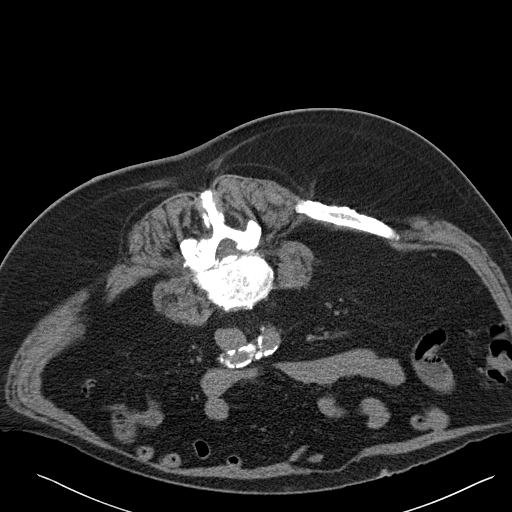
[im 37/41  bone]
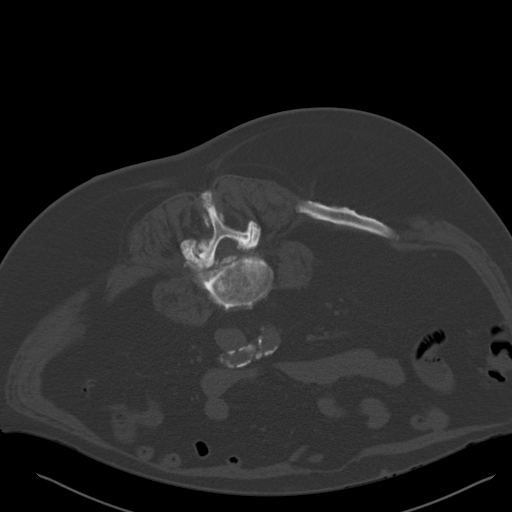

[9 of 16 positions shown; findings below may reference images not displayed]

EXAM:
CT GUIDED BONE MARROW ASPIRATES AND BIOPSY

MEDICATIONS:
None.

ANESTHESIA/SEDATION:
Fentanyl 50 mcg IV; Versed 1.0 mg IV

Moderate Sedation Time:  14 minutes

The patient was continuously monitored during the procedure by the
interventional radiology nurse under my direct supervision.

COMPLICATIONS:
None immediate.

PROCEDURE:
The procedure was explained to the patient. The risks and benefits
of the procedure were discussed and the patient's questions were
addressed. Informed consent was obtained from the patient. The
patient was placed prone on CT scan. Images of the pelvis were
obtained. The right side of back was prepped and draped in sterile
fashion. The skin and right posterior iliac bone were anesthetized
with 1% lidocaine. 11 gauge bone needle was directed into the right
iliac bone with CT guidance. Two aspirates and two core biopsied
obtained. Bandage placed over the puncture site.
IMPRESSION: CT guided bone marrow aspirates and core biopsy.

## 2018-02-03 NOTE — Telephone Encounter (Signed)
Called pt to schedule appt and he states that he has been exercising and stretching and his pain does not exist anymore.

## 2018-02-03 NOTE — Telephone Encounter (Signed)
OV trigger point

## 2018-02-13 ENCOUNTER — Other Ambulatory Visit: Payer: Self-pay | Admitting: Family Medicine

## 2018-02-26 ENCOUNTER — Other Ambulatory Visit: Payer: Self-pay | Admitting: Family Medicine

## 2018-02-26 DIAGNOSIS — G894 Chronic pain syndrome: Secondary | ICD-10-CM

## 2018-02-26 MED ORDER — HYDROCODONE-ACETAMINOPHEN 7.5-325 MG PO TABS: 1.0000 | ORAL_TABLET | Freq: Four times a day (QID) | ORAL | 0 refills | Status: DC | PRN

## 2018-02-26 NOTE — Telephone Encounter (Signed)
01/28/2018  1  11/26/2017  Alprazolam 0.5 Mg Tablet  90.00 30 Je Cop  [id number]  [facility] ([id number]  3/2 3.00 LME Comm Ins  [place]  01/06/2018  1  01/06/2018  Hydrocodone-Acetamin 7.5-325  100.00 25 Je Cop  [id number]  [facility] ([id number])  1/1 30.00 MME Comm Ins  [place]  12/28/2017  1  11/26/2017  Alprazolam 0.5 Mg Tablet  90.00 30 Je Cop  [id number]  [facility] ([id number])  2/2 3.00 LME Comm Ins  [place]  11/26/2017  1  11/26/2017  Alprazolam 0.5 Mg Tablet  90.00 30 Je Cop  [id number]  [facility] ([id number])  1/2 3.00 LME Comm Ins  [place]  11/21/2017  1  11/19/2017  Hydrocodone-Acetamin 7.5-325  100.00 25 Je Cop  [id number]  [facility] ([id number])  1/1      NCCSR reviewed as above Visit is UTD UDS is UTD as well  Ok to refill today

## 2018-02-28 ENCOUNTER — Encounter: Payer: Self-pay | Admitting: Family Medicine

## 2018-02-28 DIAGNOSIS — G2581 Restless legs syndrome: Secondary | ICD-10-CM

## 2018-02-28 DIAGNOSIS — G4700 Insomnia, unspecified: Secondary | ICD-10-CM

## 2018-02-28 MED ORDER — ALPRAZOLAM 0.5 MG PO TABS: 0.5000 mg | ORAL_TABLET | Freq: Three times a day (TID) | ORAL | 2 refills | Status: DC | PRN

## 2018-03-03 ENCOUNTER — Inpatient Hospital Stay: Payer: Medicare Other

## 2018-03-03 ENCOUNTER — Inpatient Hospital Stay: Payer: Medicare Other | Attending: Hematology & Oncology | Admitting: Hematology & Oncology

## 2018-03-03 ENCOUNTER — Other Ambulatory Visit: Payer: Self-pay

## 2018-03-03 ENCOUNTER — Encounter: Payer: Self-pay | Admitting: Hematology & Oncology

## 2018-03-03 VITALS — BP 102/49 | HR 68 | Temp 98.5°F | Resp 18 | Wt 216.0 lb

## 2018-03-03 DIAGNOSIS — D6481 Anemia due to antineoplastic chemotherapy: Secondary | ICD-10-CM | POA: Insufficient documentation

## 2018-03-03 DIAGNOSIS — Z8572 Personal history of non-Hodgkin lymphomas: Secondary | ICD-10-CM | POA: Diagnosis not present

## 2018-03-03 DIAGNOSIS — R5383 Other fatigue: Secondary | ICD-10-CM | POA: Insufficient documentation

## 2018-03-03 DIAGNOSIS — Z79899 Other long term (current) drug therapy: Secondary | ICD-10-CM | POA: Diagnosis not present

## 2018-03-03 DIAGNOSIS — N183 Chronic kidney disease, stage 3 unspecified: Secondary | ICD-10-CM

## 2018-03-03 DIAGNOSIS — Z9221 Personal history of antineoplastic chemotherapy: Secondary | ICD-10-CM | POA: Diagnosis not present

## 2018-03-03 DIAGNOSIS — R05 Cough: Secondary | ICD-10-CM | POA: Diagnosis not present

## 2018-03-03 DIAGNOSIS — I10 Essential (primary) hypertension: Secondary | ICD-10-CM | POA: Diagnosis not present

## 2018-03-03 DIAGNOSIS — I080 Rheumatic disorders of both mitral and aortic valves: Secondary | ICD-10-CM | POA: Diagnosis not present

## 2018-03-03 DIAGNOSIS — C9310 Chronic myelomonocytic leukemia not having achieved remission: Secondary | ICD-10-CM

## 2018-03-03 DIAGNOSIS — T451X5A Adverse effect of antineoplastic and immunosuppressive drugs, initial encounter: Secondary | ICD-10-CM

## 2018-03-03 LAB — CMP (CANCER CENTER ONLY)
ALBUMIN: 3.7 g/dL (ref 3.5–5.0)
ALK PHOS: 57 U/L (ref 38–126)
ALT: 14 U/L (ref 0–44)
AST: 18 U/L (ref 15–41)
Anion gap: 9 (ref 5–15)
BILIRUBIN TOTAL: 0.7 mg/dL (ref 0.3–1.2)
BUN: 21 mg/dL (ref 8–23)
CALCIUM: 9.5 mg/dL (ref 8.9–10.3)
CO2: 27 mmol/L (ref 22–32)
CREATININE: 1.38 mg/dL — AB (ref 0.61–1.24)
Chloride: 106 mmol/L (ref 98–111)
GFR, EST NON AFRICAN AMERICAN: 44 mL/min — AB (ref >60)
GFR, Est AFR Am: 51 mL/min — ABNORMAL LOW (ref >60)
GLUCOSE: 99 mg/dL (ref 70–99)
Potassium: 4 mmol/L (ref 3.5–5.1)
Sodium: 142 mmol/L (ref 135–145)
Total Protein: 7.1 g/dL (ref 6.5–8.1)

## 2018-03-03 LAB — CBC WITH DIFFERENTIAL (CANCER CENTER ONLY)
BASOS ABS: 0.1 10*3/uL (ref 0.0–0.1)
Basophils Relative: 3 %
Eosinophils Absolute: 0.1 10*3/uL (ref 0.0–0.5)
Eosinophils Relative: 1 %
HEMATOCRIT: 30.9 % — AB (ref 38.7–49.9)
HEMOGLOBIN: 10.1 g/dL — AB (ref 13.0–17.1)
LYMPHS PCT: 32 %
Lymphs Abs: 1.7 10*3/uL (ref 0.9–3.3)
MCH: 35.7 pg — ABNORMAL HIGH (ref 28.0–33.4)
MCHC: 32.7 g/dL (ref 32.0–35.9)
MCV: 109.2 fL — AB (ref 82.0–98.0)
MONO ABS: 2.4 10*3/uL — AB (ref 0.1–0.9)
Monocytes Relative: 43 %
NEUTROS ABS: 1.2 10*3/uL — AB (ref 1.5–6.5)
NEUTROS PCT: 21 %
Platelet Count: 119 10*3/uL — ABNORMAL LOW (ref 145–400)
RBC: 2.83 MIL/uL — AB (ref 4.20–5.70)
RDW: 14.1 % (ref 11.1–15.7)
WBC Count: 5.5 10*3/uL (ref 4.0–10.0)

## 2018-03-03 LAB — TECHNOLOGIST SMEAR REVIEW

## 2018-03-03 LAB — LACTATE DEHYDROGENASE: LDH: 170 U/L (ref 98–192)

## 2018-03-03 NOTE — Progress Notes (Signed)
Hematology and Oncology Follow Up Visit  Jeremiah Walker [id number] [birth year] 84 y.o. 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  He is not doing as well.  He says he just does not feel well.  He feels tired.  Does not have a lot of energy.  Quite a few relatives passed away over the summertime.  He says he is only exercising once a week.  He just feels tired.  Of course, does not go see his other doctors at all.  He does have other health issues besides the chronic myelomonocytic leukemia.  The only option I can think of that we could use for him is Aranesp.  We checked his erythropoietin level year ago.  It was only 39.  I do not think that that it really has gotten any better than this.  As such, we can try Aranesp to keep his hemoglobin above 11.  His iron studies have been okay.  He does have a new heart murmur when I listen to him today.  He will need an echocardiogram.  Depending on what the echocardiogram shows, I will let his family doctor refer him to cardiology.  He is eating okay.  His weight is stable.  There is no change in bowel or bladder habits.  He has had no rashes.  He has had no leg swelling.  Overall, his performance status is ECOG 1.    Medications:  Current Outpatient Medications:  .  allopurinol (ZYLOPRIM) 100 MG tablet, Take 1 tablet (100 mg total) by mouth daily., Disp: 30 tablet, Rfl: 5 .  ALPRAZolam (XANAX) 0.5 MG tablet, Take 1 tablet (0.5 mg total) by mouth 3 (three) times daily as needed for anxiety., Disp: 90 tablet, Rfl: 2 .  amoxicillin (AMOXIL) 500 MG capsule, Take 4 caps (2gm) by mouth once prior to dental procedure (Patient not taking: Reported on 12/24/2017), Disp: 20 capsule, Rfl: 0 .   azelastine (ASTELIN) 0.1 % nasal spray, Place 2 sprays into both nostrils 2 (two) times daily. Use in each nostril as directed, Disp: 30 mL, Rfl: 6 .  bethanechol (URECHOLINE) 25 MG tablet, TAKE 1 TABLET BY MOUTH THREE TIMES DAILY, Disp: 90 tablet, Rfl: 1 .  Calcium 500 MG tablet, Take 600 mg by mouth daily., Disp: , Rfl:  .  Cholecalciferol (VITAMIN D3) 2000 UNITS capsule, Take 2,000 Units by mouth daily., Disp: , Rfl:  .  gabapentin (NEURONTIN) 300 MG capsule, TAKE 3 CAPSULES BY MOUTH THREE TIMES DAILY, PLUS 2 CAPSULES EVERY DAY AS NEEDED, Disp: 404 capsule, Rfl: 0 .  HYDROcodone-acetaminophen (NORCO) 7.5-325 MG tablet, Take 1 tablet by mouth every 6 (six) hours as needed for moderate pain., Disp: 100 tablet, Rfl: 0 .  HYDROcodone-acetaminophen (NORCO) 7.5-325 MG tablet, Take 1 tablet by mouth every 6 (six) hours as needed for moderate pain. Ok to fill in 30 days, Disp: 100 tablet, Rfl: 0 .  Multiple Vitamin (MULTIVITAMIN) tablet, Take 1 tablet by mouth daily., Disp: , Rfl:  .  niacin 500 MG tablet, Take 500 mg by mouth at bedtime., Disp: , Rfl:  .  Omega-3 Fatty Acids (OMEGA-3 FISH OIL PO), Take 2 each by mouth every morning. EPA/DHA 520/350, Disp: , Rfl:  .  polyethylene glycol (MIRALAX / GLYCOLAX) packet, Take 17 g by mouth daily as needed., Disp: , Rfl:  .  predniSONE (DELTASONE) 20 MG tablet, Take 1 or 2 pills daily for up to 3 days as needed for gout flare, Disp: 20 tablet, Rfl: 0 .  psyllium (METAMUCIL) 58.6 % powder, Take 1 packet by mouth daily. 1 Teaspoon daily, Disp: , Rfl:  .  S-Adenosylmethionine (SAM-E) 400 MG TABS, Take 400 mg by mouth daily., Disp: , Rfl:  .  vitamin C (ASCORBIC ACID) 500 MG tablet, Take 500 mg by mouth daily., Disp: , Rfl:  .  zinc gluconate 50 MG tablet, Take 50 mg by mouth daily., Disp: , Rfl:   Allergies:  Allergies  Allergen Reactions  . Nsaids Other (See Comments) and Tinitus    Bruising   . Tolmetin Other (See Comments)    Bruising  . Ciprofloxacin  Other (See Comments)    Other reaction(s): Other (See Comments) Achilles tendon Achilles tendon Achilles tendon  . Statins Nausea And Vomiting and Other (See Comments)    Other reaction(s): Kidney issues Muscle pain Muscle pain Other reaction(s): Kidney issues Muscle pain Muscle pain Other reaction(s): Kidney issues Muscle pain  . Requip [Ropinirole Hcl] Anxiety  . Ropinirole Anxiety    Mood swing    Past Medical History, Surgical history, Social history, and Family History were reviewed and updated.  Review of Systems: Review of Systems  Constitutional: Positive for malaise/fatigue.  HENT: Negative.   Eyes: Negative.   Respiratory: Positive for cough.   Cardiovascular: Negative.   Gastrointestinal: Negative.   Genitourinary: Negative.   Musculoskeletal: Negative.   Skin: Negative.   Neurological: Negative.   Endo/Heme/Allergies: Negative.   Psychiatric/Behavioral: Negative.      Physical Exam:  weight is 216 lb (98 kg). His oral temperature is 98.5 F (36.9 C). His blood pressure is 102/49 (abnormal) and his pulse is 68. His respiration is 18 and oxygen saturation is 96%.   Wt Readings from Last 3 Encounters:  03/03/18 216 lb (98 kg)  01/03/18 209 lb (94.8 kg)  12/24/17 220 lb (99.8 kg)     Physical Exam  Constitutional: He is oriented to person, place, and time.  HENT:  Head: Normocephalic and atraumatic.  Mouth/Throat: Oropharynx is clear and moist.  Eyes: Pupils are equal, round, and reactive to light. EOM are normal.  Neck: Normal range of motion.  Cardiovascular: Normal rate and regular rhythm.  Murmur heard. In listening to his cardiac exam, he has a 2/6 systolic ejection murmur.  Pulmonary/Chest: Effort normal and breath sounds normal.  Abdominal: Soft. Bowel sounds are normal.  Musculoskeletal: Normal range of motion. He exhibits no edema, tenderness or deformity.  Lymphadenopathy:    He has no cervical adenopathy.  Neurological: He is alert  and oriented to person, place, and time.  Skin: Skin is warm and dry. No rash noted. No erythema.  Psychiatric: He has a normal mood and affect. His behavior is normal. Judgment and thought content normal.  Vitals reviewed.   Lab Results  Component Value Date   WBC 5.5 03/03/2018   HGB 10.1 (L) 03/03/2018   HCT 30.9 (L) 03/03/2018   MCV 109.2 (H) 03/03/2018   PLT 119 (L) 03/03/2018     Chemistry      Component Value Date/Time   NA 142 11/19/2017 1050   NA 139 05/08/2017 1123   NA 141 05/15/2016 1126   K 3.7 11/19/2017 1050   K 4.0 05/08/2017 1123  K 4.3 05/15/2016 1126   CL 106 11/19/2017 1050   CL 105 05/08/2017 1123   CO2 28 11/19/2017 1050   CO2 30 05/08/2017 1123   CO2 26 05/15/2016 1126   BUN 22 11/19/2017 1050   BUN 15 05/08/2017 1123   BUN 29.1 (H) 05/15/2016 1126   CREATININE 1.40 (H) 11/19/2017 1050   CREATININE 1.2 05/08/2017 1123   CREATININE 1.7 (H) 05/15/2016 1126   GLU 90 11/15/2014      Component Value Date/Time   CALCIUM 9.3 11/19/2017 1050   CALCIUM 9.1 05/08/2017 1123   CALCIUM 9.7 05/15/2016 1126   ALKPHOS 67 11/19/2017 1050   ALKPHOS 64 05/08/2017 1123   ALKPHOS 69 05/15/2016 1126   AST 22 11/19/2017 1050   AST 23 05/15/2016 1126   ALT 23 11/19/2017 1050   ALT 19 05/08/2017 1123   ALT 22 05/15/2016 1126   BILITOT 0.9 11/19/2017 1050   BILITOT 0.84 05/15/2016 1126         Impression and Plan: Jeremiah Walker is a 84 year old white male. He had a history of diffuse large cell non-Hodgkin's lymphoma. He was treated  with 6 cycles of R-CHOP. He got this treatment up in [place]. He completed this back in April 2014.   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 This could be a residual from his chemotherapy for his lymphoma.  Since his quality of life is being bothered right now, we will have to see about adding Aranesp.  We will see if this can make a difference for him.  We will see about his iron studies.  We will get an echocardiogram  to check his heart out.  I would not think that this heart murmur is pathologic.  We will see him back in about 3 weeks.  We will try to increase his hemoglobin above 11 and see if this makes him feel better.    [name], MD 9/9/201912:44 PM

## 2018-03-04 LAB — IRON AND TIBC
IRON: 99 ug/dL (ref 42–163)
Saturation Ratios: 42 % (ref 42–163)
TIBC: 235 ug/dL (ref 202–409)
UIBC: 136 ug/dL

## 2018-03-04 LAB — FERRITIN: Ferritin: 290 ng/mL (ref 24–336)

## 2018-03-07 ENCOUNTER — Ambulatory Visit (HOSPITAL_BASED_OUTPATIENT_CLINIC_OR_DEPARTMENT_OTHER)
Admission: RE | Admit: 2018-03-07 | Discharge: 2018-03-07 | Disposition: A | Discharge status: Home / Self Care | Payer: Medicare Other | Source: Ambulatory Visit | Attending: Hematology & Oncology | Admitting: Hematology & Oncology

## 2018-03-07 ENCOUNTER — Inpatient Hospital Stay: Payer: Medicare Other

## 2018-03-07 ENCOUNTER — Other Ambulatory Visit: Payer: Self-pay

## 2018-03-07 VITALS — BP 139/61 | HR 75 | Temp 98.1°F | Resp 18

## 2018-03-07 DIAGNOSIS — D6481 Anemia due to antineoplastic chemotherapy: Secondary | ICD-10-CM

## 2018-03-07 DIAGNOSIS — R011 Cardiac murmur, unspecified: Secondary | ICD-10-CM | POA: Insufficient documentation

## 2018-03-07 DIAGNOSIS — C9310 Chronic myelomonocytic leukemia not having achieved remission: Secondary | ICD-10-CM | POA: Diagnosis not present

## 2018-03-07 DIAGNOSIS — I119 Hypertensive heart disease without heart failure: Secondary | ICD-10-CM | POA: Diagnosis not present

## 2018-03-07 DIAGNOSIS — Z79899 Other long term (current) drug therapy: Secondary | ICD-10-CM | POA: Diagnosis not present

## 2018-03-07 DIAGNOSIS — I080 Rheumatic disorders of both mitral and aortic valves: Secondary | ICD-10-CM | POA: Insufficient documentation

## 2018-03-07 DIAGNOSIS — T451X5A Adverse effect of antineoplastic and immunosuppressive drugs, initial encounter: Secondary | ICD-10-CM

## 2018-03-07 DIAGNOSIS — I10 Essential (primary) hypertension: Secondary | ICD-10-CM | POA: Diagnosis not present

## 2018-03-07 DIAGNOSIS — R5383 Other fatigue: Secondary | ICD-10-CM | POA: Diagnosis not present

## 2018-03-07 MED ORDER — DARBEPOETIN ALFA 300 MCG/0.6ML IJ SOSY
PREFILLED_SYRINGE | INTRAMUSCULAR | Status: AC
  Filled 2018-03-07: qty 0.6

## 2018-03-07 MED ORDER — PERFLUTREN LIPID MICROSPHERE
1.0000 mL | INTRAVENOUS | Status: AC | PRN
  Administered 2018-03-07 – 2018-04-04 (×2): 3 mL via INTRAVENOUS
  Filled 2018-03-07: qty 10

## 2018-03-07 MED ORDER — DARBEPOETIN ALFA 300 MCG/0.6ML IJ SOSY
300.0000 ug | PREFILLED_SYRINGE | Freq: Once | INTRAMUSCULAR | Status: AC
  Administered 2018-03-07: 300 ug via SUBCUTANEOUS

## 2018-03-07 NOTE — Patient Instructions (Signed)
Darbepoetin Alfa injection (Aranesp) What is this medicine? DARBEPOETIN ALFA (dar be POE e tin AL fa) helps your body make more red blood cells. It is used to treat anemia caused by chronic kidney failure and chemotherapy. This medicine may be used for other purposes; ask your health care provider or pharmacist if you have questions. COMMON BRAND NAME(S): Aranesp What should I tell my health care provider before I take this medicine? They need to know if you have any of these conditions: -blood clotting disorders or history of blood clots -cancer patient not on chemotherapy -cystic fibrosis -heart disease, such as angina, heart failure, or a history of a heart attack -hemoglobin level of 12 g/dL or greater -high blood pressure -low levels of folate, iron, or vitamin B12 -seizures -an unusual or allergic reaction to darbepoetin, erythropoietin, albumin, hamster proteins, latex, other medicines, foods, dyes, or preservatives -pregnant or trying to get pregnant -breast-feeding How should I use this medicine? This medicine is for injection into a vein or under the skin. It is usually given by a health care professional in a hospital or clinic setting. If you get this medicine at home, you will be taught how to prepare and give this medicine. Use exactly as directed. Take your medicine at regular intervals. Do not take your medicine more often than directed. It is important that you put your used needles and syringes in a special sharps container. Do not put them in a trash can. If you do not have a sharps container, call your pharmacist or healthcare provider to get one. A special MedGuide will be given to you by the pharmacist with each prescription and refill. Be sure to read this information carefully each time. Talk to your pediatrician regarding the use of this medicine in children. While this medicine may be used in children as young as 1 year for selected conditions, precautions do  apply. Overdosage: If you think you have taken too much of this medicine contact a poison control center or emergency room at once. NOTE: This medicine is only for you. Do not share this medicine with others. What if I miss a dose? If you miss a dose, take it as soon as you can. If it is almost time for your next dose, take only that dose. Do not take double or extra doses. What may interact with this medicine? Do not take this medicine with any of the following medications: -epoetin alfa This list may not describe all possible interactions. Give your health care provider a list of all the medicines, herbs, non-prescription drugs, or dietary supplements you use. Also tell them if you smoke, drink alcohol, or use illegal drugs. Some items may interact with your medicine. What should I watch for while using this medicine? Your condition will be monitored carefully while you are receiving this medicine. You may need blood work done while you are taking this medicine. What side effects may I notice from receiving this medicine? Side effects that you should report to your doctor or health care professional as soon as possible: -allergic reactions like skin rash, itching or hives, swelling of the face, lips, or tongue -breathing problems -changes in vision -chest pain -confusion, trouble speaking or understanding -feeling faint or lightheaded, falls -high blood pressure -muscle aches or pains -pain, swelling, warmth in the leg -rapid weight gain -severe headaches -sudden numbness or weakness of the face, arm or leg -trouble walking, dizziness, loss of balance or coordination -seizures (convulsions) -swelling of the ankles, feet,  hands -unusually weak or tired Side effects that usually do not require medical attention (report to your doctor or health care professional if they continue or are bothersome): -diarrhea -fever, chills (flu-like symptoms) -headaches -nausea, vomiting -redness,  stinging, or swelling at site where injected This list may not describe all possible side effects. Call your doctor for medical advice about side effects. You may report side effects to FDA at 1-800-FDA-1088. Where should I keep my medicine? Keep out of the reach of children. Store in a refrigerator between 2 and 8 degrees C (36 and 46 degrees F). Do not freeze. Do not shake. Throw away any unused portion if using a single-dose vial. Throw away any unused medicine after the expiration date. NOTE: This sheet is a summary. It may not cover all possible information. If you have questions about this medicine, talk to your doctor, pharmacist, or health care provider.  2018 Elsevier/Gold Standard (2016-01-30 19:52:26)

## 2018-03-07 NOTE — Progress Notes (Signed)
  Echocardiogram 2D Echocardiogram has been performed.  Jeremiah Walker [name] 03/07/2018, 11:27 AM

## 2018-03-10 ENCOUNTER — Encounter (INDEPENDENT_AMBULATORY_CARE_PROVIDER_SITE_OTHER): Payer: Self-pay | Admitting: Physical Medicine and Rehabilitation

## 2018-03-10 ENCOUNTER — Telehealth: Payer: Self-pay | Admitting: *Deleted

## 2018-03-10 NOTE — Telephone Encounter (Addendum)
Patient is aware of results  ----- Message from [name], MD sent at 03/08/2018  6:50 AM EDT ----- Call - heart is doing ok. There is some mild valve dysfunction, but this should not bother you. Jeremiah Walker

## 2018-03-23 ENCOUNTER — Encounter: Payer: Self-pay | Admitting: Family Medicine

## 2018-03-23 DIAGNOSIS — J302 Other seasonal allergic rhinitis: Secondary | ICD-10-CM

## 2018-03-24 ENCOUNTER — Other Ambulatory Visit: Payer: Self-pay

## 2018-03-24 ENCOUNTER — Encounter: Payer: Self-pay | Admitting: Family Medicine

## 2018-03-24 ENCOUNTER — Inpatient Hospital Stay: Payer: Medicare Other

## 2018-03-24 ENCOUNTER — Inpatient Hospital Stay (HOSPITAL_BASED_OUTPATIENT_CLINIC_OR_DEPARTMENT_OTHER): Payer: Medicare Other | Admitting: Hematology & Oncology

## 2018-03-24 ENCOUNTER — Encounter: Payer: Self-pay | Admitting: Hematology & Oncology

## 2018-03-24 VITALS — BP 131/56 | HR 82 | Temp 97.5°F | Resp 19 | Wt 218.0 lb

## 2018-03-24 DIAGNOSIS — I10 Essential (primary) hypertension: Secondary | ICD-10-CM

## 2018-03-24 DIAGNOSIS — T451X5A Adverse effect of antineoplastic and immunosuppressive drugs, initial encounter: Principal | ICD-10-CM

## 2018-03-24 DIAGNOSIS — D6481 Anemia due to antineoplastic chemotherapy: Secondary | ICD-10-CM | POA: Diagnosis not present

## 2018-03-24 DIAGNOSIS — Z9221 Personal history of antineoplastic chemotherapy: Secondary | ICD-10-CM

## 2018-03-24 DIAGNOSIS — Z8572 Personal history of non-Hodgkin lymphomas: Secondary | ICD-10-CM | POA: Diagnosis not present

## 2018-03-24 DIAGNOSIS — C9310 Chronic myelomonocytic leukemia not having achieved remission: Secondary | ICD-10-CM | POA: Diagnosis not present

## 2018-03-24 DIAGNOSIS — I080 Rheumatic disorders of both mitral and aortic valves: Secondary | ICD-10-CM | POA: Diagnosis not present

## 2018-03-24 DIAGNOSIS — Z79899 Other long term (current) drug therapy: Secondary | ICD-10-CM

## 2018-03-24 DIAGNOSIS — R5383 Other fatigue: Secondary | ICD-10-CM | POA: Diagnosis not present

## 2018-03-24 LAB — CMP (CANCER CENTER ONLY)
ALT: 16 U/L (ref 10–47)
AST: 23 U/L (ref 11–38)
Albumin: 3.8 g/dL (ref 3.5–5.0)
Alkaline Phosphatase: 67 U/L (ref 26–84)
Anion gap: 7 (ref 5–15)
BUN: 27 mg/dL — ABNORMAL HIGH (ref 7–22)
CO2: 27 mmol/L (ref 18–33)
Calcium: 9.4 mg/dL (ref 8.0–10.3)
Chloride: 107 mmol/L (ref 98–108)
Creatinine: 1.4 mg/dL — ABNORMAL HIGH (ref 0.60–1.20)
Glucose, Bld: 223 mg/dL — ABNORMAL HIGH (ref 73–118)
Potassium: 4.1 mmol/L (ref 3.3–4.7)
Sodium: 141 mmol/L (ref 128–145)
Total Bilirubin: 1.1 mg/dL (ref 0.2–1.6)
Total Protein: 7.5 g/dL (ref 6.4–8.1)

## 2018-03-24 LAB — CBC WITH DIFFERENTIAL (CANCER CENTER ONLY)
BASOS PCT: 3 %
Basophils Absolute: 0.2 10*3/uL — ABNORMAL HIGH (ref 0.0–0.1)
EOS PCT: 0 %
Eosinophils Absolute: 0 10*3/uL (ref 0.0–0.5)
HEMATOCRIT: 35.2 % — AB (ref 38.7–49.9)
HEMOGLOBIN: 11.4 g/dL — AB (ref 13.0–17.1)
LYMPHS PCT: 16 %
Lymphs Abs: 0.9 10*3/uL (ref 0.9–3.3)
MCH: 35.6 pg — AB (ref 28.0–33.4)
MCHC: 32.4 g/dL (ref 32.0–35.9)
MCV: 110 fL — ABNORMAL HIGH (ref 82.0–98.0)
MONOS PCT: 16 %
Monocytes Absolute: 0.9 10*3/uL (ref 0.1–0.9)
NEUTROS PCT: 65 %
Neutro Abs: 3.7 10*3/uL (ref 1.5–6.5)
Platelet Count: 108 10*3/uL — ABNORMAL LOW (ref 145–400)
RBC: 3.2 MIL/uL — AB (ref 4.20–5.70)
RDW: 14.6 % (ref 11.1–15.7)
WBC: 5.7 10*3/uL (ref 4.0–10.0)

## 2018-03-24 LAB — RETICULOCYTES
RBC.: 3.21 MIL/uL — AB (ref 4.20–5.82)
RETIC COUNT ABSOLUTE: 99.5 10*3/uL — AB (ref 34.8–93.9)
Retic Ct Pct: 3.1 % — ABNORMAL HIGH (ref 0.8–1.8)

## 2018-03-24 MED ORDER — DARBEPOETIN ALFA 300 MCG/0.6ML IJ SOSY
PREFILLED_SYRINGE | INTRAMUSCULAR | Status: AC
  Filled 2018-03-24: qty 0.6

## 2018-03-24 MED ORDER — AZELASTINE HCL 0.1 % NA SOLN: 2.0000 | Freq: Two times a day (BID) | NASAL | 11 refills | Status: DC

## 2018-03-24 NOTE — Progress Notes (Signed)
Hematology and Oncology Follow Up Visit  Jeremiah Walker [id number] [birth year] 84 y.o. 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  He is doing a lot better.  Thankfully, the Aranesp has helped.  His hemoglobin is now above 11.  He says he has more energy.  He is able to exercise a little bit more.  There is been no issues with bleeding.  He has had no fever.  He has had no cough or shortness of breath.  We did go ahead and get an echocardiogram.  This is done on 03/07/2018.  The echocardiogram showed an ejection fraction of 60-65%.  He has some increased wall thickness because hypertension.  There was mild stenosis of the aortic valve.  There is mild to moderate left atrial enlargement.  He has mild mitral regurgitation.  His appetite is doing quite well.  Unfortunately, he did have a bout of gout over the weekend.  He is on prednisone for this.  This would explain why his blood sugars are up this morning.  There is no change in bowel or bladder habits.  He has had no rashes.  He has had no leg swelling.  Overall, his performance status is ECOG 1.    Medications:  Current Outpatient Medications:  .  allopurinol (ZYLOPRIM) 100 MG tablet, Take 1 tablet (100 mg total) by mouth daily., Disp: 30 tablet, Rfl: 5 .  ALPRAZolam (XANAX) 0.5 MG tablet, Take 1 tablet (0.5 mg total) by mouth 3 (three) times daily as needed for anxiety., Disp: 90 tablet, Rfl: 2 .  amoxicillin (AMOXIL) 500 MG capsule, Take 4 caps (2gm) by mouth once prior to dental procedure (Patient not taking: Reported on 12/24/2017), Disp: 20 capsule, Rfl: 0 .  azelastine (ASTELIN) 0.1 % nasal spray, Place 2 sprays into both nostrils 2 (two) times daily. Use in each nostril as  directed, Disp: 30 mL, Rfl: 6 .  bethanechol (URECHOLINE) 25 MG tablet, TAKE 1 TABLET BY MOUTH THREE TIMES DAILY, Disp: 90 tablet, Rfl: 1 .  Calcium 500 MG tablet, Take 600 mg by mouth daily., Disp: , Rfl:  .  Cholecalciferol (VITAMIN D3) 2000 UNITS capsule, Take 2,000 Units by mouth daily., Disp: , Rfl:  .  gabapentin (NEURONTIN) 300 MG capsule, TAKE 3 CAPSULES BY MOUTH THREE TIMES DAILY, PLUS 2 CAPSULES EVERY DAY AS NEEDED, Disp: 404 capsule, Rfl: 0 .  HYDROcodone-acetaminophen (NORCO) 7.5-325 MG tablet, Take 1 tablet by mouth every 6 (six) hours as needed for moderate pain., Disp: 100 tablet, Rfl: 0 .  HYDROcodone-acetaminophen (NORCO) 7.5-325 MG tablet, Take 1 tablet by mouth every 6 (six) hours as needed for moderate pain. Ok to fill in 30 days, Disp: 100 tablet, Rfl: 0 .  Multiple Vitamin (MULTIVITAMIN) tablet, Take 1 tablet by mouth daily., Disp: , Rfl:  .  niacin 500 MG tablet, Take 500 mg by mouth at bedtime., Disp: , Rfl:  .  Omega-3 Fatty Acids (OMEGA-3 FISH OIL PO), Take 2 each by mouth every morning. EPA/DHA 520/350, Disp: , Rfl:  .  polyethylene glycol (MIRALAX / GLYCOLAX) packet, Take 17 g by mouth daily as needed., Disp: , Rfl:  .  predniSONE (DELTASONE) 20 MG tablet, Take 1 or 2 pills daily  for up to 3 days as needed for gout flare, Disp: 20 tablet, Rfl: 0 .  psyllium (METAMUCIL) 58.6 % powder, Take 1 packet by mouth daily. 1 Teaspoon daily, Disp: , Rfl:  .  Pyridoxine HCl (B-6 PO), Take by mouth., Disp: , Rfl:  .  S-Adenosylmethionine (SAM-E) 400 MG TABS, Take 400 mg by mouth daily., Disp: , Rfl:  .  vitamin C (ASCORBIC ACID) 500 MG tablet, Take 500 mg by mouth daily., Disp: , Rfl:  .  zinc gluconate 50 MG tablet, Take 50 mg by mouth daily., Disp: , Rfl:   Allergies:  Allergies  Allergen Reactions  . Nsaids Other (See Comments) and Tinitus    Bruising   . Tolmetin Other (See Comments)    Bruising  . Ciprofloxacin Other (See Comments)    Other reaction(s): Other (See  Comments) Achilles tendon Achilles tendon Achilles tendon  . Statins Nausea And Vomiting and Other (See Comments)    Other reaction(s): Kidney issues Muscle pain Muscle pain Other reaction(s): Kidney issues Muscle pain Muscle pain Other reaction(s): Kidney issues Muscle pain  . Requip [Ropinirole Hcl] Anxiety  . Ropinirole Anxiety    Mood swing    Past Medical History, Surgical history, Social history, and Family History were reviewed and updated.  Review of Systems: Review of Systems  Constitutional: Positive for malaise/fatigue.  HENT: Negative.   Eyes: Negative.   Respiratory: Positive for cough.   Cardiovascular: Negative.   Gastrointestinal: Negative.   Genitourinary: Negative.   Musculoskeletal: Negative.   Skin: Negative.   Neurological: Negative.   Endo/Heme/Allergies: Negative.   Psychiatric/Behavioral: Negative.      Physical Exam:  weight is 218 lb (98.9 kg). His oral temperature is 97.5 F (36.4 C) (abnormal). His blood pressure is 131/56 (abnormal) and his pulse is 82. His respiration is 19 and oxygen saturation is 96%.   Wt Readings from Last 3 Encounters:  03/24/18 218 lb (98.9 kg)  03/03/18 216 lb (98 kg)  01/03/18 209 lb (94.8 kg)     Physical Exam  Constitutional: He is oriented to person, place, and time.  HENT:  Head: Normocephalic and atraumatic.  Mouth/Throat: Oropharynx is clear and moist.  Eyes: Pupils are equal, round, and reactive to light. EOM are normal.  Neck: Normal range of motion.  Cardiovascular: Normal rate and regular rhythm.  Murmur heard. In listening to his cardiac exam, he has a 2/6 systolic ejection murmur.  Pulmonary/Chest: Effort normal and breath sounds normal.  Abdominal: Soft. Bowel sounds are normal.  Musculoskeletal: Normal range of motion. He exhibits no edema, tenderness or deformity.  Lymphadenopathy:    He has no cervical adenopathy.  Neurological: He is alert and oriented to person, place, and time.    Skin: Skin is warm and dry. No rash noted. No erythema.  Psychiatric: He has a normal mood and affect. His behavior is normal. Judgment and thought content normal.  Vitals reviewed.   Lab Results  Component Value Date   WBC 5.7 03/24/2018   HGB 11.4 (L) 03/24/2018   HCT 35.2 (L) 03/24/2018   MCV 110.0 (H) 03/24/2018   PLT 108 (L) 03/24/2018     Chemistry      Component Value Date/Time   NA 141 03/24/2018 1057   NA 139 05/08/2017 1123   NA 141 05/15/2016 1126   K 4.1 03/24/2018 1057   K 4.0 05/08/2017 1123   K 4.3 05/15/2016 1126   CL 107 03/24/2018 1057   CL 105 05/08/2017 1123  CO2 27 03/24/2018 1057   CO2 30 05/08/2017 1123   CO2 26 05/15/2016 1126   BUN 27 (H) 03/24/2018 1057   BUN 15 05/08/2017 1123   BUN 29.1 (H) 05/15/2016 1126   CREATININE 1.40 (H) 03/24/2018 1057   CREATININE 1.2 05/08/2017 1123   CREATININE 1.7 (H) 05/15/2016 1126   GLU 90 11/15/2014      Component Value Date/Time   CALCIUM 9.4 03/24/2018 1057   CALCIUM 9.1 05/08/2017 1123   CALCIUM 9.7 05/15/2016 1126   ALKPHOS 67 03/24/2018 1057   ALKPHOS 64 05/08/2017 1123   ALKPHOS 69 05/15/2016 1126   AST 23 03/24/2018 1057   AST 23 05/15/2016 1126   ALT 16 03/24/2018 1057   ALT 19 05/08/2017 1123   ALT 22 05/15/2016 1126   BILITOT 1.1 03/24/2018 1057   BILITOT 0.84 05/15/2016 1126         Impression and Plan: Jeremiah Walker is a 84 year old white male. He had a history of diffuse large cell non-Hodgkin's lymphoma. He was treated  with 6 cycles of R-CHOP. He got this treatment up in [place]. He completed this back in April 2014.   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 This could be a residual from his chemotherapy for his lymphoma.  Thankfully, the Aranesp did help.  Because of this, we will continue him on this.  He does not any Aranesp today.  I will see him back in another 4 weeks or so.  I want to make sure that we get his blood as well as possible so that he will enjoy the  upcoming holiday season.    [name], MD 9/30/201911:54 AM

## 2018-03-25 LAB — FERRITIN: FERRITIN: 182 ng/mL (ref 24–336)

## 2018-03-25 LAB — IRON AND TIBC
IRON: 115 ug/dL (ref 42–163)
SATURATION RATIOS: 44 % (ref 42–163)
TIBC: 263 ug/dL (ref 202–409)
UIBC: 148 ug/dL

## 2018-03-28 DIAGNOSIS — I129 Hypertensive chronic kidney disease with stage 1 through stage 4 chronic kidney disease, or unspecified chronic kidney disease: Secondary | ICD-10-CM | POA: Diagnosis not present

## 2018-03-28 DIAGNOSIS — C8330 Diffuse large B-cell lymphoma, unspecified site: Secondary | ICD-10-CM | POA: Diagnosis not present

## 2018-03-28 DIAGNOSIS — N183 Chronic kidney disease, stage 3 (moderate): Secondary | ICD-10-CM | POA: Diagnosis not present

## 2018-03-28 DIAGNOSIS — K219 Gastro-esophageal reflux disease without esophagitis: Secondary | ICD-10-CM | POA: Diagnosis not present

## 2018-03-28 DIAGNOSIS — D631 Anemia in chronic kidney disease: Secondary | ICD-10-CM | POA: Diagnosis not present

## 2018-03-28 DIAGNOSIS — E785 Hyperlipidemia, unspecified: Secondary | ICD-10-CM | POA: Diagnosis not present

## 2018-03-28 DIAGNOSIS — C9310 Chronic myelomonocytic leukemia not having achieved remission: Secondary | ICD-10-CM | POA: Diagnosis not present

## 2018-03-28 DIAGNOSIS — Z6833 Body mass index (BMI) 33.0-33.9, adult: Secondary | ICD-10-CM | POA: Diagnosis not present

## 2018-03-28 DIAGNOSIS — D696 Thrombocytopenia, unspecified: Secondary | ICD-10-CM | POA: Diagnosis not present

## 2018-03-28 DIAGNOSIS — M1990 Unspecified osteoarthritis, unspecified site: Secondary | ICD-10-CM | POA: Diagnosis not present

## 2018-03-28 DIAGNOSIS — G629 Polyneuropathy, unspecified: Secondary | ICD-10-CM | POA: Diagnosis not present

## 2018-03-28 DIAGNOSIS — N2581 Secondary hyperparathyroidism of renal origin: Secondary | ICD-10-CM | POA: Diagnosis not present

## 2018-04-02 ENCOUNTER — Other Ambulatory Visit: Payer: Self-pay | Admitting: Family Medicine

## 2018-04-02 DIAGNOSIS — R350 Frequency of micturition: Secondary | ICD-10-CM

## 2018-04-02 DIAGNOSIS — Z0000 Encounter for general adult medical examination without abnormal findings: Secondary | ICD-10-CM

## 2018-04-03 DIAGNOSIS — L578 Other skin changes due to chronic exposure to nonionizing radiation: Secondary | ICD-10-CM | POA: Diagnosis not present

## 2018-04-03 DIAGNOSIS — L820 Inflamed seborrheic keratosis: Secondary | ICD-10-CM | POA: Diagnosis not present

## 2018-04-03 DIAGNOSIS — D1801 Hemangioma of skin and subcutaneous tissue: Secondary | ICD-10-CM | POA: Diagnosis not present

## 2018-04-03 DIAGNOSIS — H61001 Unspecified perichondritis of right external ear: Secondary | ICD-10-CM | POA: Diagnosis not present

## 2018-04-03 DIAGNOSIS — L570 Actinic keratosis: Secondary | ICD-10-CM | POA: Diagnosis not present

## 2018-04-03 NOTE — Telephone Encounter (Signed)
Bethanechol rx request denied, as already refilled 02/13/18 #90, with 1 RF left.

## 2018-04-04 NOTE — Addendum Note (Signed)
Encounter addended by: Melvenia Beam T on: 04/04/2018 9:05 AM  Actions taken: MAR administration accepted

## 2018-04-10 ENCOUNTER — Other Ambulatory Visit: Payer: Self-pay | Admitting: Family Medicine

## 2018-04-11 MED ORDER — GABAPENTIN 300 MG PO CAPS: ORAL_CAPSULE | ORAL | 0 refills | Status: DC

## 2018-04-19 ENCOUNTER — Other Ambulatory Visit: Payer: Self-pay | Admitting: Family Medicine

## 2018-04-19 DIAGNOSIS — G894 Chronic pain syndrome: Secondary | ICD-10-CM

## 2018-04-21 MED ORDER — HYDROCODONE-ACETAMINOPHEN 7.5-325 MG PO TABS: 1.0000 | ORAL_TABLET | Freq: Four times a day (QID) | ORAL | 0 refills | Status: DC | PRN

## 2018-04-21 NOTE — Telephone Encounter (Signed)
Reviewed [place] 03/30/2018  1   02/28/2018  Alprazolam 0.5 Mg Tablet  90.00 30 Je Cop  [id number]  [facility] ([id number])  1/2 3.00 LME Comm Ins  [place]  02/28/2018  1   02/28/2018  Alprazolam 0.5 Mg Tablet  90.00 30 Je Cop  [id number]  [facility] ([id number])  0/2 3.00 LME Comm Ins  [place]  02/26/2018  1   02/26/2018  Hydrocodone-Acetamin 7.5-325  100.00 25 Je Cop  [id number]  [facility] ([id number])  0/0 30.00 MME Comm Ins  [place]  01/28/2018  1   11/26/2017  Alprazolam 0.5 Mg Tablet  90.00 30 Je Cop  [id number]  [facility] ([id number])  2/2 3.00 LME Comm Ins  [place]  01/06/2018  1   01/06/2018  Hydrocodone-Acetamin 7.5-325  100.00 25 Je Cop  [id number]  [facility] ([id number])  0/0 30.00 MME Comm Ins  [place]  12/28/2017  1   11/26/2017  Alprazolam 0.5 Mg Tablet  90.00 30 Je Cop  [id number]  [facility] ([id number])  1/2 3.00 LME Comm Ins  [place]  11/26/2017  1   11/26/2017  Alprazolam 0.5 Mg Tablet  90.00 30 Je Cop  [id number]  [facility] ([id number])  0/2 3.00 LME Comm Ins  [place]  11/21/2017  1   11/19/2017  Hydrocodone-Acetamin 7.5-325  100.00 25 Je Cop  [id number]  [facility] ([id number])  0/0 30.00 MME Comm Ins  [place]  10/28/2017  1   08/16/2017  Alprazolam 0.5 Mg Tablet  90.00 30 Je Cop  [id number]  [facility] ([id number])  2/2 3.00 LME Comm Ins  [place]  10/09/2017  1   10/09/2017  Hydrocodone-Acetamin 7.5-325  100.00 25 Je Cop  [id number]  [facility] ([id number])  0/0      UDS is UTD Ok to refill today

## 2018-04-23 ENCOUNTER — Inpatient Hospital Stay: Payer: Medicare Other

## 2018-04-23 ENCOUNTER — Other Ambulatory Visit: Payer: Self-pay

## 2018-04-23 ENCOUNTER — Encounter: Payer: Self-pay | Admitting: Hematology & Oncology

## 2018-04-23 ENCOUNTER — Inpatient Hospital Stay: Payer: Medicare Other | Attending: Hematology & Oncology | Admitting: Hematology & Oncology

## 2018-04-23 VITALS — BP 122/55 | HR 82 | Temp 98.5°F | Resp 18 | Wt 218.4 lb

## 2018-04-23 DIAGNOSIS — R05 Cough: Secondary | ICD-10-CM | POA: Diagnosis not present

## 2018-04-23 DIAGNOSIS — T451X5A Adverse effect of antineoplastic and immunosuppressive drugs, initial encounter: Principal | ICD-10-CM

## 2018-04-23 DIAGNOSIS — Z79899 Other long term (current) drug therapy: Secondary | ICD-10-CM | POA: Diagnosis not present

## 2018-04-23 DIAGNOSIS — C9310 Chronic myelomonocytic leukemia not having achieved remission: Secondary | ICD-10-CM

## 2018-04-23 DIAGNOSIS — R5383 Other fatigue: Secondary | ICD-10-CM | POA: Insufficient documentation

## 2018-04-23 DIAGNOSIS — D6481 Anemia due to antineoplastic chemotherapy: Secondary | ICD-10-CM

## 2018-04-23 DIAGNOSIS — Z8572 Personal history of non-Hodgkin lymphomas: Secondary | ICD-10-CM | POA: Insufficient documentation

## 2018-04-23 DIAGNOSIS — Z9221 Personal history of antineoplastic chemotherapy: Secondary | ICD-10-CM | POA: Insufficient documentation

## 2018-04-23 LAB — CMP (CANCER CENTER ONLY)
ALBUMIN: 3.7 g/dL (ref 3.5–5.0)
ALT: 19 U/L (ref 10–47)
AST: 26 U/L (ref 11–38)
Alkaline Phosphatase: 68 U/L (ref 26–84)
Anion gap: 2 — ABNORMAL LOW (ref 5–15)
BUN: 24 mg/dL — AB (ref 7–22)
CALCIUM: 9.3 mg/dL (ref 8.0–10.3)
CO2: 29 mmol/L (ref 18–33)
CREATININE: 1.5 mg/dL — AB (ref 0.60–1.20)
Chloride: 110 mmol/L — ABNORMAL HIGH (ref 98–108)
GLUCOSE: 133 mg/dL — AB (ref 73–118)
Potassium: 4.2 mmol/L (ref 3.3–4.7)
SODIUM: 141 mmol/L (ref 128–145)
TOTAL PROTEIN: 7.2 g/dL (ref 6.4–8.1)
Total Bilirubin: 0.8 mg/dL (ref 0.2–1.6)

## 2018-04-23 LAB — RETICULOCYTES
IMMATURE RETIC FRACT: 16.1 % — AB (ref 2.3–15.9)
RBC.: 3.19 MIL/uL — ABNORMAL LOW (ref 4.22–5.81)
RETIC CT PCT: 1.3 % (ref 0.4–3.1)
Retic Count, Absolute: 40.2 10*3/uL (ref 19.0–186.0)

## 2018-04-23 LAB — CBC WITH DIFFERENTIAL (CANCER CENTER ONLY)
ABS IMMATURE GRANULOCYTES: 0.06 10*3/uL (ref 0.00–0.07)
BASOS PCT: 0 %
Basophils Absolute: 0 10*3/uL (ref 0.0–0.1)
Eosinophils Absolute: 0.1 10*3/uL (ref 0.0–0.5)
Eosinophils Relative: 1 %
HCT: 34.3 % — ABNORMAL LOW (ref 39.0–52.0)
Hemoglobin: 10.8 g/dL — ABNORMAL LOW (ref 13.0–17.0)
IMMATURE GRANULOCYTES: 1 %
Lymphocytes Relative: 33 %
Lymphs Abs: 1.7 10*3/uL (ref 0.7–4.0)
MCH: 33.9 pg (ref 26.0–34.0)
MCHC: 31.5 g/dL (ref 30.0–36.0)
MCV: 107.5 fL — ABNORMAL HIGH (ref 80.0–100.0)
MONO ABS: 2 10*3/uL — AB (ref 0.1–1.0)
Monocytes Relative: 39 %
NEUTROS ABS: 1.4 10*3/uL — AB (ref 1.7–7.7)
Neutrophils Relative %: 26 %
PLATELETS: 104 10*3/uL — AB (ref 150–400)
RBC: 3.19 MIL/uL — AB (ref 4.22–5.81)
RDW: 14.1 % (ref 11.5–15.5)
WBC: 5.2 10*3/uL (ref 4.0–10.5)
nRBC: 0 % (ref 0.0–0.2)

## 2018-04-23 LAB — SAVE SMEAR (SSMR)

## 2018-04-23 MED ORDER — DARBEPOETIN ALFA 300 MCG/0.6ML IJ SOSY
PREFILLED_SYRINGE | INTRAMUSCULAR | Status: AC
  Filled 2018-04-23: qty 0.6

## 2018-04-23 MED ORDER — DARBEPOETIN ALFA 300 MCG/0.6ML IJ SOSY
300.0000 ug | PREFILLED_SYRINGE | Freq: Once | INTRAMUSCULAR | Status: AC
  Administered 2018-04-23: 300 ug via SUBCUTANEOUS

## 2018-04-23 NOTE — Progress Notes (Signed)
Hematology and Oncology Follow Up Visit  Jeremiah Walker [id number] Apr 21, 1928 84 y.o. 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 Ala is back for follow-up.  He really has done okay.  He did not receive any Aranesp last time as his hemoglobin was 11.4.  He is exercising.  He is stretching.  He is enjoying a better quality of life.  I think the Aranesp is contributing to this.  He has a good appetite.  He has had no nausea or vomiting.  He has had no cardiac issues.  He has had no leg swelling.  He has had no rashes.  He is looking forward to Thanksgiving.  I do want to make sure that we get him back before Thanksgiving to make sure that his anemia is addressed if needed.  Overall, his performance status is ECOG 1.    Medications:  Current Outpatient Medications:  .  allopurinol (ZYLOPRIM) 100 MG tablet, Take 1 tablet (100 mg total) by mouth daily., Disp: 30 tablet, Rfl: 5 .  ALPRAZolam (XANAX) 0.5 MG tablet, Take 1 tablet (0.5 mg total) by mouth 3 (three) times daily as needed for anxiety., Disp: 90 tablet, Rfl: 2 .  amoxicillin (AMOXIL) 500 MG capsule, Take 4 caps (2gm) by mouth once prior to dental procedure (Patient not taking: Reported on 12/24/2017), Disp: 20 capsule, Rfl: 0 .  azelastine (ASTELIN) 0.1 % nasal spray, Place 2 sprays into both nostrils 2 (two) times daily. Use in each nostril as directed, Disp: 30 mL, Rfl: 11 .  bethanechol (URECHOLINE) 25 MG tablet, TAKE 1 TABLET BY MOUTH THREE TIMES DAILY, Disp: 90 tablet, Rfl: 1 .  Calcium 500 MG tablet, Take 600 mg by mouth daily., Disp: , Rfl:  .  Cholecalciferol (VITAMIN D3) 2000 UNITS capsule, Take 2,000 Units by mouth daily., Disp: , Rfl:  .  gabapentin (NEURONTIN) 300 MG capsule, TAKE 3 CAPSULES BY  MOUTH THREE TIMES DAILY, PLUS 2 CAPSULES EVERY DAY AS NEEDED, Disp: 404 capsule, Rfl: 0 .  HYDROcodone-acetaminophen (NORCO) 7.5-325 MG tablet, Take 1 tablet by mouth every 6 (six) hours as needed for moderate pain., Disp: 100 tablet, Rfl: 0 .  HYDROcodone-acetaminophen (NORCO) 7.5-325 MG tablet, Take 1 tablet by mouth every 6 (six) hours as needed for moderate pain. Ok to fill in 30 days, Disp: 100 tablet, Rfl: 0 .  Multiple Vitamin (MULTIVITAMIN) tablet, Take 1 tablet by mouth daily., Disp: , Rfl:  .  niacin 500 MG tablet, Take 500 mg by mouth at bedtime., Disp: , Rfl:  .  Omega-3 Fatty Acids (OMEGA-3 FISH OIL PO), Take 2 each by mouth every morning. EPA/DHA 520/350, Disp: , Rfl:  .  polyethylene glycol (MIRALAX / GLYCOLAX) packet, Take 17 g by mouth daily as needed., Disp: , Rfl:  .  predniSONE (DELTASONE) 20 MG tablet, TAKE 1 OR 2 TABLETS BY MOUTH DAILY FOR UP TO 3 DAYS AS NEEDED FOR GOUT FLARES, Disp: 20 tablet, Rfl: 0 .  psyllium (METAMUCIL) 58.6 % powder, Take 1 packet by mouth daily. 1 Teaspoon daily, Disp: , Rfl:  .  Pyridoxine HCl (B-6 PO), Take by mouth., Disp: , Rfl:  .  S-Adenosylmethionine (SAM-E) 400 MG TABS, Take 400 mg by mouth daily., Disp: , Rfl:  .  vitamin C (ASCORBIC ACID) 500 MG tablet, Take 500 mg by mouth daily., Disp: , Rfl:  .  zinc gluconate 50 MG tablet, Take 50 mg by mouth daily., Disp: , Rfl:   Allergies:  Allergies  Allergen Reactions  . Nsaids Other (See Comments) and Tinitus    Bruising   . Tolmetin Other (See Comments)    Bruising  . Ciprofloxacin Other (See Comments)    Other reaction(s): Other (See Comments) Achilles tendon Achilles tendon Achilles tendon  . Statins Nausea And Vomiting and Other (See Comments)    Other reaction(s): Kidney issues Muscle pain Muscle pain Other reaction(s): Kidney issues Muscle pain Muscle pain Other reaction(s): Kidney issues Muscle pain  . Requip [Ropinirole Hcl] Anxiety  . Ropinirole Anxiety    Mood swing     Past Medical History, Surgical history, Social history, and Family History were reviewed and updated.  Review of Systems: Review of Systems  Constitutional: Positive for malaise/fatigue.  HENT: Negative.   Eyes: Negative.   Respiratory: Positive for cough.   Cardiovascular: Negative.   Gastrointestinal: Negative.   Genitourinary: Negative.   Musculoskeletal: Negative.   Skin: Negative.   Neurological: Negative.   Endo/Heme/Allergies: Negative.   Psychiatric/Behavioral: Negative.      Physical Exam:  weight is 218 lb 6.4 oz (99.1 kg). His oral temperature is 98.5 F (36.9 C). His blood pressure is 122/55 (abnormal) and his pulse is 82. His respiration is 18 and oxygen saturation is 98%.   Wt Readings from Last 3 Encounters:  04/23/18 218 lb 6.4 oz (99.1 kg)  03/24/18 218 lb (98.9 kg)  03/03/18 216 lb (98 kg)     Physical Exam  Constitutional: He is oriented to person, place, and time.  HENT:  Head: Normocephalic and atraumatic.  Mouth/Throat: Oropharynx is clear and moist.  Eyes: Pupils are equal, round, and reactive to light. EOM are normal.  Neck: Normal range of motion.  Cardiovascular: Normal rate and regular rhythm.  Murmur heard. In listening to his cardiac exam, he has a 2/6 systolic ejection murmur.  Pulmonary/Chest: Effort normal and breath sounds normal.  Abdominal: Soft. Bowel sounds are normal.  Musculoskeletal: Normal range of motion. He exhibits no edema, tenderness or deformity.  Lymphadenopathy:    He has no cervical adenopathy.  Neurological: He is alert and oriented to person, place, and time.  Skin: Skin is warm and dry. No rash noted. No erythema.  Psychiatric: He has a normal mood and affect. His behavior is normal. Judgment and thought content normal.  Vitals reviewed.   Lab Results  Component Value Date   WBC 5.2 04/23/2018   HGB 10.8 (L) 04/23/2018   HCT 34.3 (L) 04/23/2018   MCV 107.5 (H) 04/23/2018   PLT 104 (L) 04/23/2018      Chemistry      Component Value Date/Time   NA 141 04/23/2018 1415   NA 139 05/08/2017 1123   NA 141 05/15/2016 1126   K 4.2 04/23/2018 1415   K 4.0 05/08/2017 1123   K 4.3 05/15/2016 1126   CL 110 (H) 04/23/2018 1415   CL 105 05/08/2017 1123   CO2 29 04/23/2018 1415   CO2 30 05/08/2017 1123   CO2 26 05/15/2016 1126   BUN 24 (H) 04/23/2018 1415   BUN 15 05/08/2017 1123   BUN 29.1 (H) 05/15/2016 1126   CREATININE 1.50 (H) 04/23/2018 1415   CREATININE 1.2 05/08/2017 1123   CREATININE 1.7 (H) 05/15/2016 1126   GLU 90 11/15/2014  Component Value Date/Time   CALCIUM 9.3 04/23/2018 1415   CALCIUM 9.1 05/08/2017 1123   CALCIUM 9.7 05/15/2016 1126   ALKPHOS 68 04/23/2018 1415   ALKPHOS 64 05/08/2017 1123   ALKPHOS 69 05/15/2016 1126   AST 26 04/23/2018 1415   AST 23 05/15/2016 1126   ALT 19 04/23/2018 1415   ALT 19 05/08/2017 1123   ALT 22 05/15/2016 1126   BILITOT 0.8 04/23/2018 1415   BILITOT 0.84 05/15/2016 1126         Impression and Plan: Mr. Birdwell is a 84 year old white male. He had a history of diffuse large cell non-Hodgkin's lymphoma. He was treated  with 6 cycles of R-CHOP. He got this treatment up in [place]. He completed this back in April 2014.   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 This could be a residual from his chemotherapy for his lymphoma.  I will go ahead and give him some Aranesp today.  Again I want to make sure that we get his hemoglobin above 11.  I will plan to have him come back in 3 weeks.  Again, I want to make sure that his anemia is minimal if any during the holidays.  [name], MD 10/30/20192:52 PM

## 2018-04-23 NOTE — Patient Instructions (Signed)

## 2018-04-24 LAB — IRON AND TIBC
IRON: 81 ug/dL (ref 42–163)
Saturation Ratios: 33 % (ref 20–55)
TIBC: 244 ug/dL (ref 202–409)
UIBC: 162 ug/dL (ref 117–376)

## 2018-04-24 LAB — FERRITIN: FERRITIN: 298 ng/mL (ref 24–336)

## 2018-05-01 ENCOUNTER — Other Ambulatory Visit: Payer: Self-pay | Admitting: Family Medicine

## 2018-05-01 DIAGNOSIS — M1000 Idiopathic gout, unspecified site: Secondary | ICD-10-CM

## 2018-05-08 DIAGNOSIS — H40013 Open angle with borderline findings, low risk, bilateral: Secondary | ICD-10-CM | POA: Diagnosis not present

## 2018-05-15 ENCOUNTER — Inpatient Hospital Stay: Payer: Medicare Other | Attending: Hematology & Oncology | Admitting: Hematology & Oncology

## 2018-05-15 ENCOUNTER — Other Ambulatory Visit: Payer: Self-pay

## 2018-05-15 ENCOUNTER — Inpatient Hospital Stay: Payer: Medicare Other

## 2018-05-15 ENCOUNTER — Encounter: Payer: Self-pay | Admitting: Hematology & Oncology

## 2018-05-15 VITALS — BP 130/63 | HR 64 | Temp 97.8°F | Resp 18 | Wt 218.0 lb

## 2018-05-15 DIAGNOSIS — C9310 Chronic myelomonocytic leukemia not having achieved remission: Secondary | ICD-10-CM

## 2018-05-15 DIAGNOSIS — Z9221 Personal history of antineoplastic chemotherapy: Secondary | ICD-10-CM | POA: Insufficient documentation

## 2018-05-15 DIAGNOSIS — D6481 Anemia due to antineoplastic chemotherapy: Secondary | ICD-10-CM

## 2018-05-15 DIAGNOSIS — R05 Cough: Secondary | ICD-10-CM | POA: Diagnosis not present

## 2018-05-15 DIAGNOSIS — R5383 Other fatigue: Secondary | ICD-10-CM | POA: Diagnosis not present

## 2018-05-15 DIAGNOSIS — Z79899 Other long term (current) drug therapy: Secondary | ICD-10-CM | POA: Diagnosis not present

## 2018-05-15 DIAGNOSIS — Z8572 Personal history of non-Hodgkin lymphomas: Secondary | ICD-10-CM | POA: Diagnosis not present

## 2018-05-15 DIAGNOSIS — T451X5A Adverse effect of antineoplastic and immunosuppressive drugs, initial encounter: Secondary | ICD-10-CM

## 2018-05-15 LAB — CBC WITH DIFFERENTIAL (CANCER CENTER ONLY)
ABS IMMATURE GRANULOCYTES: 0.05 10*3/uL (ref 0.00–0.07)
BASOS ABS: 0 10*3/uL (ref 0.0–0.1)
BASOS PCT: 1 %
EOS ABS: 0 10*3/uL (ref 0.0–0.5)
Eosinophils Relative: 1 %
HEMATOCRIT: 34.2 % — AB (ref 39.0–52.0)
Hemoglobin: 10.8 g/dL — ABNORMAL LOW (ref 13.0–17.0)
IMMATURE GRANULOCYTES: 1 %
LYMPHS ABS: 2.4 10*3/uL (ref 0.7–4.0)
Lymphocytes Relative: 43 %
MCH: 34.5 pg — ABNORMAL HIGH (ref 26.0–34.0)
MCHC: 31.6 g/dL (ref 30.0–36.0)
MCV: 109.3 fL — ABNORMAL HIGH (ref 80.0–100.0)
Monocytes Absolute: 1.9 10*3/uL — ABNORMAL HIGH (ref 0.1–1.0)
Monocytes Relative: 35 %
NEUTROS ABS: 1 10*3/uL — AB (ref 1.7–7.7)
NEUTROS PCT: 19 %
NRBC: 0 % (ref 0.0–0.2)
PLATELETS: 110 10*3/uL — AB (ref 150–400)
RBC: 3.13 MIL/uL — ABNORMAL LOW (ref 4.22–5.81)
RDW: 14.9 % (ref 11.5–15.5)
WBC Count: 5.5 10*3/uL (ref 4.0–10.5)

## 2018-05-15 LAB — CMP (CANCER CENTER ONLY)
ALBUMIN: 3.6 g/dL (ref 3.5–5.0)
ALT: 15 U/L (ref 10–47)
AST: 25 U/L (ref 11–38)
Alkaline Phosphatase: 53 U/L (ref 26–84)
Anion gap: 0 — ABNORMAL LOW (ref 5–15)
BUN: 25 mg/dL — ABNORMAL HIGH (ref 7–22)
CALCIUM: 9.1 mg/dL (ref 8.0–10.3)
CO2: 29 mmol/L (ref 18–33)
CREATININE: 1.6 mg/dL — AB (ref 0.60–1.20)
Chloride: 107 mmol/L (ref 98–108)
GLUCOSE: 90 mg/dL (ref 73–118)
Potassium: 4.2 mmol/L (ref 3.3–4.7)
Sodium: 136 mmol/L (ref 128–145)
Total Bilirubin: 0.9 mg/dL (ref 0.2–1.6)
Total Protein: 7.2 g/dL (ref 6.4–8.1)

## 2018-05-15 LAB — RETICULOCYTES
Immature Retic Fract: 12.7 % (ref 2.3–15.9)
RBC.: 3.13 MIL/uL — ABNORMAL LOW (ref 4.22–5.81)
Retic Count, Absolute: 44.8 10*3/uL (ref 19.0–186.0)
Retic Ct Pct: 1.4 % (ref 0.4–3.1)

## 2018-05-15 MED ORDER — DARBEPOETIN ALFA 300 MCG/0.6ML IJ SOSY
PREFILLED_SYRINGE | INTRAMUSCULAR | Status: AC
  Filled 2018-05-15: qty 0.6

## 2018-05-15 MED ORDER — DARBEPOETIN ALFA 300 MCG/0.6ML IJ SOSY
300.0000 ug | PREFILLED_SYRINGE | Freq: Once | INTRAMUSCULAR | Status: AC
  Administered 2018-05-15: 300 ug via SUBCUTANEOUS

## 2018-05-15 NOTE — Progress Notes (Signed)
Hematology and Oncology Follow Up Visit  Jeremiah Walker [id number] January 19, 1928 84 y.o. 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  He said that he felt pretty rough on Monday but this has gotten better.  There is been no issues with pain.  He does have gout on occasion.  He has had some occasional discomfort in his bones.  We did go ahead and give him some Aranesp last time we saw him.  He is looking forward to Thanksgiving.  He is having some family come down from [place] and then he will go to a son's house in [place].  He is still exercising.  He is doing a lot of stretching.  I must give him a lot of credit for doing as well as he is doing.  Jeremiah Walker is incredibly proactive and is doing his best to stay active and young.  Overall, his performance status is ECOG 1.    Medications:  Current Outpatient Medications:  .  b complex vitamins tablet, Take 1 tablet by mouth daily., Disp: , Rfl:  .  Magnesium Oxide 200 MG TABS, Take by mouth., Disp: , Rfl:  .  allopurinol (ZYLOPRIM) 100 MG tablet, TAKE 1 TABLET(100 MG) BY MOUTH DAILY, Disp: 30 tablet, Rfl: 5 .  ALPRAZolam (XANAX) 0.5 MG tablet, Take 1 tablet (0.5 mg total) by mouth 3 (three) times daily as needed for anxiety., Disp: 90 tablet, Rfl: 2 .  amoxicillin (AMOXIL) 500 MG capsule, Take 4 caps (2gm) by mouth once prior to dental procedure (Patient not taking: Reported on 12/24/2017), Disp: 20 capsule, Rfl: 0 .  azelastine (ASTELIN) 0.1 % nasal spray, Place 2 sprays into both nostrils 2 (two) times daily. Use in each nostril as directed, Disp: 30 mL, Rfl: 11 .  bethanechol (URECHOLINE) 25 MG tablet, TAKE 1 TABLET BY MOUTH THREE TIMES DAILY, Disp: 90 tablet, Rfl: 1 .   Calcium 500 MG tablet, Take 600 mg by mouth daily., Disp: , Rfl:  .  Cholecalciferol (VITAMIN D3) 2000 UNITS capsule, Take 2,000 Units by mouth daily., Disp: , Rfl:  .  gabapentin (NEURONTIN) 300 MG capsule, TAKE 3 CAPSULES BY MOUTH THREE TIMES DAILY, PLUS 2 CAPSULES EVERY DAY AS NEEDED, Disp: 404 capsule, Rfl: 0 .  HYDROcodone-acetaminophen (NORCO) 7.5-325 MG tablet, Take 1 tablet by mouth every 6 (six) hours as needed for moderate pain., Disp: 100 tablet, Rfl: 0 .  HYDROcodone-acetaminophen (NORCO) 7.5-325 MG tablet, Take 1 tablet by mouth every 6 (six) hours as needed for moderate pain. Ok to fill in 30 days, Disp: 100 tablet, Rfl: 0 .  Multiple Vitamin (MULTIVITAMIN) tablet, Take 1 tablet by mouth daily., Disp: , Rfl:  .  Omega-3 Fatty Acids (OMEGA-3 FISH OIL PO), Take 2 each by mouth every morning. EPA/DHA 520/350, Disp: , Rfl:  .  polyethylene glycol (MIRALAX / GLYCOLAX) packet, Take 17 g by mouth daily as needed., Disp: , Rfl:  .  predniSONE (DELTASONE) 20 MG tablet, TAKE 1 OR 2 TABLETS BY MOUTH DAILY FOR UP TO 3 DAYS AS NEEDED FOR GOUT FLARES, Disp: 20 tablet, Rfl: 0 .  psyllium (METAMUCIL) 58.6 % powder, Take 1 packet by mouth daily. 1 Teaspoon daily, Disp: , Rfl:  .  Pyridoxine HCl (B-6 PO), Take by mouth., Disp: , Rfl:  .  S-Adenosylmethionine (SAM-E) 400 MG TABS, Take 400 mg by mouth daily., Disp: , Rfl:  .  vitamin C (ASCORBIC ACID) 500 MG tablet, Take 500 mg by mouth daily., Disp: , Rfl:  .  zinc gluconate 50 MG tablet, Take 50 mg by mouth daily., Disp: , Rfl:   Allergies:  Allergies  Allergen Reactions  . Nsaids Other (See Comments) and Tinitus    Bruising   . Tolmetin Other (See Comments)    Bruising  . Ciprofloxacin Other (See Comments)    Other reaction(s): Other (See Comments) Achilles tendon Achilles tendon Achilles tendon Achilles tendon  . Statins Nausea And Vomiting and Other (See Comments)    Other reaction(s): Kidney issues Muscle pain Muscle pain Other  reaction(s): Kidney issues Muscle pain Muscle pain Other reaction(s): Kidney issues Muscle pain Muscle pain Other reaction(s): Kidney issues Muscle pain  . Requip [Ropinirole Hcl] Anxiety  . Ropinirole Anxiety    Mood swing    Past Medical History, Surgical history, Social history, and Family History were reviewed and updated.  Review of Systems: Review of Systems  Constitutional: Positive for malaise/fatigue.  HENT: Negative.   Eyes: Negative.   Respiratory: Positive for cough.   Cardiovascular: Negative.   Gastrointestinal: Negative.   Genitourinary: Negative.   Musculoskeletal: Negative.   Skin: Negative.   Neurological: Negative.   Endo/Heme/Allergies: Negative.   Psychiatric/Behavioral: Negative.      Physical Exam:  weight is 218 lb (98.9 kg). His oral temperature is 97.8 F (36.6 C). His blood pressure is 130/63 and his pulse is 64. His respiration is 18 and oxygen saturation is 99%.   Wt Readings from Last 3 Encounters:  05/15/18 218 lb (98.9 kg)  04/23/18 218 lb 6.4 oz (99.1 kg)  03/24/18 218 lb (98.9 kg)     Physical Exam  Constitutional: He is oriented to person, place, and time.  HENT:  Head: Normocephalic and atraumatic.  Mouth/Throat: Oropharynx is clear and moist.  Eyes: Pupils are equal, round, and reactive to light. EOM are normal.  Neck: Normal range of motion.  Cardiovascular: Normal rate and regular rhythm.  Murmur heard. In listening to his cardiac exam, he has a 2/6 systolic ejection murmur.  Pulmonary/Chest: Effort normal and breath sounds normal.  Abdominal: Soft. Bowel sounds are normal.  Musculoskeletal: Normal range of motion. He exhibits no edema, tenderness or deformity.  Lymphadenopathy:    He has no cervical adenopathy.  Neurological: He is alert and oriented to person, place, and time.  Skin: Skin is warm and dry. No rash noted. No erythema.  Psychiatric: He has a normal mood and affect. His behavior is normal. Judgment and  thought content normal.  Vitals reviewed.   Lab Results  Component Value Date   WBC 5.5 05/15/2018   HGB 10.8 (L) 05/15/2018   HCT 34.2 (L) 05/15/2018   MCV 109.3 (H) 05/15/2018   PLT 110 (L) 05/15/2018     Chemistry      Component Value Date/Time   NA 136 05/15/2018 1154   NA 139 05/08/2017 1123   NA 141 05/15/2016 1126   K 4.2 05/15/2018 1154   K 4.0 05/08/2017 1123   K 4.3 05/15/2016 1126   CL 107 05/15/2018 1154   CL 105 05/08/2017 1123   CO2 29 05/15/2018 1154   CO2 30 05/08/2017 1123   CO2 26 05/15/2016 1126   BUN 25 (H) 05/15/2018 1154   BUN  15 05/08/2017 1123   BUN 29.1 (H) 05/15/2016 1126   CREATININE 1.60 (H) 05/15/2018 1154   CREATININE 1.2 05/08/2017 1123   CREATININE 1.7 (H) 05/15/2016 1126   GLU 90 11/15/2014      Component Value Date/Time   CALCIUM 9.1 05/15/2018 1154   CALCIUM 9.1 05/08/2017 1123   CALCIUM 9.7 05/15/2016 1126   ALKPHOS 53 05/15/2018 1154   ALKPHOS 64 05/08/2017 1123   ALKPHOS 69 05/15/2016 1126   AST 25 05/15/2018 1154   AST 23 05/15/2016 1126   ALT 15 05/15/2018 1154   ALT 19 05/08/2017 1123   ALT 22 05/15/2016 1126   BILITOT 0.9 05/15/2018 1154   BILITOT 0.84 05/15/2016 1126         Impression and Plan: Mr. Shawgo is a 84 year old white male. He had a history of diffuse large cell non-Hodgkin's lymphoma. He was treated  with 6 cycles of R-CHOP. He got this treatment up in [place]. He completed this back in April 2014.   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 This could be a residual from his chemotherapy for his lymphoma.  I will go ahead and give him some Aranesp today.  Again I want to make sure that we get his hemoglobin above 11.  I will plan to have him come back in 3 weeks.  Again, I want to make sure that his anemia is minimal if any during the holidays.  [name], MD 11/21/20191:10 PM

## 2018-05-15 NOTE — Patient Instructions (Signed)

## 2018-05-16 LAB — IRON AND TIBC
IRON: 120 ug/dL (ref 42–163)
Saturation Ratios: 49 % (ref 20–55)
TIBC: 245 ug/dL (ref 202–409)
UIBC: 125 ug/dL (ref 117–376)

## 2018-05-16 LAB — FERRITIN: FERRITIN: 219 ng/mL (ref 24–336)

## 2018-05-21 ENCOUNTER — Encounter: Payer: Self-pay | Admitting: Family Medicine

## 2018-05-21 DIAGNOSIS — G6289 Other specified polyneuropathies: Secondary | ICD-10-CM

## 2018-05-25 MED ORDER — GABAPENTIN 300 MG PO CAPS: ORAL_CAPSULE | ORAL | 5 refills | Status: DC

## 2018-05-29 ENCOUNTER — Other Ambulatory Visit: Payer: Self-pay | Admitting: Family Medicine

## 2018-05-29 ENCOUNTER — Encounter: Payer: Self-pay | Admitting: Family Medicine

## 2018-05-29 DIAGNOSIS — G2581 Restless legs syndrome: Secondary | ICD-10-CM

## 2018-05-29 DIAGNOSIS — G894 Chronic pain syndrome: Secondary | ICD-10-CM

## 2018-05-29 DIAGNOSIS — G4700 Insomnia, unspecified: Secondary | ICD-10-CM

## 2018-05-29 MED ORDER — HYDROCODONE-ACETAMINOPHEN 7.5-325 MG PO TABS: 1.0000 | ORAL_TABLET | Freq: Four times a day (QID) | ORAL | 0 refills | Status: DC | PRN

## 2018-05-29 NOTE — Telephone Encounter (Signed)
Can refill but OV needed as it has been 6 months Message to pt  [place] 04/29/2018  1   02/28/2018  Alprazolam 0.5 Mg Tablet  90.00 30 Je Cop  [id number]  [facility] ([id number]  2/2 3.00 LME Comm Ins  [place]  04/21/2018  1   04/21/2018  Hydrocodone-Acetamin 7.5-325  100.00 25 Je Cop  [id number]  [facility] ([id number])  0/0 30.00 MME Comm Ins  [place]  03/30/2018  1   02/28/2018  Alprazolam 0.5 Mg Tablet  90.00 30 Je Cop  [id number]  [facility] ([id number])  1/2 3.00 LME Comm Ins  [place]  02/28/2018  1   02/28/2018  Alprazolam 0.5 Mg Tablet  90.00 30 Je Cop  [id number]  [facility] ([id number])  0/2 3.00 LME Comm Ins  [place]  02/26/2018  1   02/26/2018  Hydrocodone-Acetamin 7.5-325  100.00 25 Je Cop  [id number]  [facility] ([id number])  0/0 30.00 MME Comm Ins  [place]  01/28/2018  1   11/26/2017  Alprazolam 0.5 Mg Tablet  90.00 30 Je Cop  [id number]  [facility] ([id number])  2/2

## 2018-06-01 NOTE — Progress Notes (Signed)
[facility] [address] [place], [place] [address] [phone]  Date:  06/04/2018   Name:  Jeremiah Walker   DOB:  07-Dec-1927   MRN:  [id number]  PCP:  [name], MD    Chief Complaint: Peripheral Neuropathy (worsening, trouble moving hands, tingling in hands, worsened over last 2 months) and Hip Issues (shorter leg than the other, worsening, hip/back pain)   History of Present Illness:  Jeremiah Walker is a 84 y.o. very pleasant male patient who presents with the following:  He goes to the gym at his facility 3x a week and does some stretching and walking most days  He takes norco prn for his chronic pains.  I refilled this for him yesterday  UDS was done in June   He is seen by oncology for his CML- previous history of NHL treated in 2014  He notes that his peripheral neuropathy in his hands seems to be getting worse over the last 6 weeks or so He notes esp when he wakes up in the am or tries to hold something Once he gets up and moving it tends to improve  Previously his neuropathy (from chemo in 2013/14) was more of a subtle, periodic tingling in his hands. He will get some zings into his hands and wrists. He notes the symptoms in his thumb index long and less of his ring fingers.  The pinky is fair. Putting hot water on hurts so he avoids this Overusing his hands also worsens his sx  Sx in his feet are about the same He is already taking a good amount of gabapentin.  Recent visit with Dr. Marin Olp;  Jeremiah Walker is a 84 year old white male. He had a history of diffuse large cell non-Hodgkin's lymphoma. He was treated  with 6 cycles of R-CHOP. He got this treatment up in [place]. He completed this back in April 2014.  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 This could be a residual from his chemotherapy for his lymphoma. I will go ahead and give him some Aranesp today.  Again I want to make sure  that we get his hemoglobin above 11. I will plan to have him come back in 3 weeks.  Again, I want to make sure that his anemia is minimal if any during the holidays.  He has noted that the LEFT leg is about 1/2 shorter than the other over the last year  He did have a hip replacement in 2011-after this procedure the left leg was 1/4 inch shorter.  However, this discrepancy seems to have gotten worse.  This may be worsening his back pain.  He had some dry needling done to his left back several weeks ago which was helpful.  He is also wearing lifts in his shoes which are helpful.  Right hip replacement also in approx 2003   Patient Active Problem List   Diagnosis Date Noted  . Chronic myelomonocytic leukemia not having achieved remission ([place]) 12/04/2016  . Chronic pain syndrome 09/26/2015  . De Quervain's tenosynovitis, right 09/13/2015  . Anemia due to antineoplastic chemotherapy 08/15/2015  . DLBCL (diffuse large B cell lymphoma) ([place]) 08/15/2015  . Skin lesion of face 05/21/2015  . Peripheral neuropathy due to chemotherapy ([place]) 02/09/2015  . Benign prostatic hyperplasia with urinary obstruction 02/09/2015  . Bilateral hearing loss 02/09/2015  . Low back pain 02/09/2015  . Chronic kidney disease (CKD), stage III (moderate) (HCC)  02/09/2015  . Essential (primary) hypertension 02/09/2015  . Gastro-esophageal reflux disease without esophagitis 02/09/2015  . Anxiety, generalized 02/09/2015  . Cannot sleep 02/09/2015  . Combined fat and carbohydrate induced hyperlipemia 02/09/2015    Past Medical History:  Diagnosis Date  . Anemia   . Anemia due to antineoplastic chemotherapy 08/15/2015  . Anxiety   . Arthritis   . Cancer ([place])   . Cataract   . Chronic kidney disease (CKD), stage III (moderate) (HCC) 02/09/2015   Controlled  . CMML (chronic myelomonocytic leukemia) ([place])   . CMML (chronic myelomonocytic leukemia) ([place])   . Combined fat and carbohydrate induced hyperlipemia  02/09/2015   Controlled  . Depression   . DLBCL (diffuse large B cell lymphoma) ([place]) 08/15/2015  . Essential (primary) hypertension 02/09/2015  . Gastro-esophageal reflux disease without esophagitis 02/09/2015   Controlled  . Neuromuscular disorder ([place])   . Tinnitus     Past Surgical History:  Procedure Laterality Date  . CATARACT EXTRACTION, BILATERAL    . COLONOSCOPY  May 2011  . CYSTOSCOPY    . TOTAL HIP ARTHROPLASTY  2003   Right   . TOTAL HIP ARTHROPLASTY  April, 2011   Left  . TRIGGER FINGER RELEASE  Nov 2013  . TURBINECTOMY  2006  . UPPER GI ENDOSCOPY  Nov 2015    Social History   Tobacco Use  . Smoking status: Former Research scientist (life sciences)  . Smokeless tobacco: Never Used  . Tobacco comment: Quit >50 years ago  Substance Use Topics  . Alcohol use: Yes    Alcohol/week: 1.0 - 4.0 standard drinks    Types: 1 - 4 Shots of liquor per week    Comment: 4 glasses per week  . Drug use: No    Family History  Problem Relation Age of Onset  . Anuerysm Mother 28       Deceased  . Colon cancer Father 75       Deceased  . Colon cancer Brother   . Prostate cancer Brother        Deceased  . Alzheimer's disease Brother   . Heart disease Brother   . Alzheimer's disease Paternal Grandmother   . Early death Maternal Grandmother        Unknown  . Obesity Son   . Obesity Son        Gastric Bypass  . Healthy Son   . Healthy Daughter   . Diabetes Neg Hx     Allergies  Allergen Reactions  . Nsaids Other (See Comments) and Tinitus    Bruising   . Tolmetin Other (See Comments)    Bruising  . Ciprofloxacin Other (See Comments)    Other reaction(s): Other (See Comments) Achilles tendon Achilles tendon Achilles tendon Achilles tendon  . Statins Nausea And Vomiting and Other (See Comments)    Other reaction(s): Kidney issues Muscle pain Muscle pain Other reaction(s): Kidney issues Muscle pain Muscle pain Other reaction(s): Kidney issues Muscle pain Muscle pain Other  reaction(s): Kidney issues Muscle pain  . Requip [Ropinirole Hcl] Anxiety  . Ropinirole Anxiety    Mood swing    Medication list has been reviewed and updated.  Current Outpatient Medications on File Prior to Visit  Medication Sig Dispense Refill  . allopurinol (ZYLOPRIM) 100 MG tablet TAKE 1 TABLET(100 MG) BY MOUTH DAILY 30 tablet 5  . ALPRAZolam (XANAX) 0.5 MG tablet TAKE 1 TABLET(0.5 MG) BY MOUTH THREE TIMES DAILY AS NEEDED FOR ANXIETY 90 tablet 3  . amoxicillin (AMOXIL)  500 MG capsule Take 4 caps (2gm) by mouth once prior to dental procedure 20 capsule 0  . azelastine (ASTELIN) 0.1 % nasal spray Place 2 sprays into both nostrils 2 (two) times daily. Use in each nostril as directed 30 mL 11  . b complex vitamins tablet Take 1 tablet by mouth daily.    . bethanechol (URECHOLINE) 25 MG tablet TAKE 1 TABLET BY MOUTH THREE TIMES DAILY 90 tablet 1  . Calcium 500 MG tablet Take 600 mg by mouth daily.    . Cholecalciferol (VITAMIN D3) 2000 UNITS capsule Take 2,000 Units by mouth daily.    Marland Kitchen gabapentin (NEURONTIN) 300 MG capsule TAKE 1 CAPSULES BY MOUTH THREE TIMES DAILY, PLUS 2 CAPSULES EVERY DAY AS NEEDED 150 capsule 5  . HYDROcodone-acetaminophen (NORCO) 7.5-325 MG tablet Take 1 tablet by mouth every 6 (six) hours as needed for moderate pain. 100 tablet 0  . Magnesium Oxide 200 MG TABS Take by mouth.    . Multiple Vitamin (MULTIVITAMIN) tablet Take 1 tablet by mouth daily.    . Omega-3 Fatty Acids (OMEGA-3 FISH OIL PO) Take 2 each by mouth every morning. EPA/DHA 520/350    . polyethylene glycol (MIRALAX / GLYCOLAX) packet Take 17 g by mouth daily as needed.    . predniSONE (DELTASONE) 20 MG tablet TAKE 1 OR 2 TABLETS BY MOUTH DAILY FOR UP TO 3 DAYS AS NEEDED FOR GOUT FLARES 20 tablet 0  . psyllium (METAMUCIL) 58.6 % powder Take 1 packet by mouth daily. 1 Teaspoon daily    . Pyridoxine HCl (B-6 PO) Take by mouth.    . S-Adenosylmethionine (SAM-E) 400 MG TABS Take 400 mg by mouth daily.    Marland Kitchen  UNABLE TO FIND Med Name: B-6 VITAMIN    . vitamin C (ASCORBIC ACID) 500 MG tablet Take 500 mg by mouth daily.    Marland Kitchen zinc gluconate 50 MG tablet Take 50 mg by mouth daily.     No current facility-administered medications on file prior to visit.     Review of Systems:  As per HPI- otherwise negative. He is already on gabapentin - 5 per day  No fever or chills.  No chest pain or shortness of breath.  Otherwise feeling well.  Physical Examination: Vitals:   06/04/18 1445  BP: 122/80  Pulse: 81  Resp: 16  Temp: 98.3 F (36.8 C)  SpO2: 97%   Vitals:   06/04/18 1445  Weight: 220 lb (99.8 kg)  Height: 5' 9.5" (1.765 m)   Body mass index is 32.02 kg/m. Ideal Body Weight: Weight in (lb) to have BMI = 25: 171.4  GEN: WDWN, NAD, Non-toxic, A & O x 3, obese, looks well.  Looks his normal self HEENT: Atraumatic, Normocephalic. Neck supple. No masses, No LAD. Ears and Nose: No external deformity. CV: RRR, 2/6 murmur (not new). No JVD. No thrill. No extra heart sounds. PULM: CTA B, no wheezes, crackles, rhonchi. No retractions. No resp. distress. No accessory muscle use. ABD: S, NT, ND, +BS. No rebound. No HSM. EXTR: No c/c/e NEURO Normal gait.  PSYCH: Normally interactive. Conversant. Not depressed or anxious appearing.  Calm demeanor.  On exam his left leg does appear to be slightly shorter than his right leg. Both hands with normal grip strength, normal biceps and triceps and deltoid strength.  I am not able to reproduce his symptoms by tapping on the median nerve.  Difficult to perform flexion exam of the wrist, due to poor flexion secondary to  arthritis.   Assessment and Plan: Carpal tunnel syndrome, bilateral  Aortic valve stenosis, etiology of cardiac valve disease unspecified - Plan: amoxicillin (AMOXIL) 500 MG capsule  Idiopathic gout, unspecified chronicity, unspecified site - Plan: allopurinol (ZYLOPRIM) 100 MG tablet  Chronic pain syndrome  It actually seems that his  current hand symptoms are most consistent with carpal tunnel syndrome.  Suggest that he use carpal tunnel type wrist splints while asleep, and when practical during the day.  He will let me know if this not helpful for max couple of weeks.   Refilled his amoxicillin which he uses prior to dental procedure. Refilled allopurinol. Recently refilled hydrocodone which he uses for chronic back pain.  Signed Lamar Blinks, MD s

## 2018-06-03 NOTE — Telephone Encounter (Signed)
[facility]:  05/29/2018  1   05/29/2018  Hydrocodone-Acetamin 7.5-325  100.00 25 Je Cop  [id number]  [facility] ([id number])  0/0 30.00 MME Comm Ins  [place]  04/29/2018  1   02/28/2018  Alprazolam 0.5 Mg Tablet  90.00 30 Je Cop  [id number]  [facility] ([id number])  2/2 3.00 LME Comm Ins  [place]  04/21/2018  1   04/21/2018  Hydrocodone-Acetamin 7.5-325  100.00 25 Je Cop  [id number]  [facility] ([id number])  0/0 30.00 MME Comm Ins  [place]  03/30/2018  1   02/28/2018  Alprazolam 0.5 Mg Tablet  90.00 30 Je Cop  [id number]  [facility] ([id number])  1/2 3.00 LME Comm Ins  [place]  02/28/2018  1   02/28/2018  Alprazolam 0.5 Mg Tablet  90.00 30 Je Cop  [id number]  [facility] ([id number])  0/2 3.00 LME Comm Ins  [place]  02/26/2018  1   02/26/2018  Hydrocodone-Acetamin 7.5-325  100.00 25 Je Cop  [id number]  [facility] ([id number])  0/0 30.00 MME Comm Ins  [place]  01/28/2018  1   11/26/2017  Alprazolam 0.5 Mg Tablet  90.00 30 Je Cop  [id number]  [facility] ([id number])  2/2      He is seeing me tomorrow

## 2018-06-04 ENCOUNTER — Ambulatory Visit (INDEPENDENT_AMBULATORY_CARE_PROVIDER_SITE_OTHER): Payer: Medicare Other | Admitting: Family Medicine

## 2018-06-04 ENCOUNTER — Encounter: Payer: Self-pay | Admitting: Family Medicine

## 2018-06-04 VITALS — BP 122/80 | HR 81 | Temp 98.3°F | Resp 16 | Ht 69.5 in | Wt 220.0 lb

## 2018-06-04 DIAGNOSIS — G894 Chronic pain syndrome: Secondary | ICD-10-CM

## 2018-06-04 DIAGNOSIS — G5603 Carpal tunnel syndrome, bilateral upper limbs: Secondary | ICD-10-CM

## 2018-06-04 DIAGNOSIS — M1000 Idiopathic gout, unspecified site: Secondary | ICD-10-CM

## 2018-06-04 DIAGNOSIS — I350 Nonrheumatic aortic (valve) stenosis: Secondary | ICD-10-CM

## 2018-06-04 MED ORDER — ALLOPURINOL 100 MG PO TABS: 200.0000 mg | ORAL_TABLET | Freq: Every day | ORAL | 3 refills | Status: DC

## 2018-06-04 MED ORDER — AMOXICILLIN 500 MG PO CAPS: ORAL_CAPSULE | ORAL | 0 refills | Status: DC

## 2018-06-04 NOTE — Patient Instructions (Signed)
It was good to see you today I agree with using a lift in your shoe to even out your leg length difference  Try using OTC carpal tunnel splints for your wrist pain; use these while you sleep, and during the day when practical If not doing better in the next couple of weeks please alert me  I refilled your amoxicillin as well Take care!

## 2018-06-11 ENCOUNTER — Encounter: Payer: Self-pay | Admitting: Family Medicine

## 2018-06-11 MED ORDER — BETHANECHOL CHLORIDE 25 MG PO TABS: 25.0000 mg | ORAL_TABLET | Freq: Three times a day (TID) | ORAL | 11 refills | Status: DC

## 2018-06-20 ENCOUNTER — Inpatient Hospital Stay (HOSPITAL_BASED_OUTPATIENT_CLINIC_OR_DEPARTMENT_OTHER): Payer: Medicare Other | Admitting: Hematology & Oncology

## 2018-06-20 ENCOUNTER — Inpatient Hospital Stay: Payer: Medicare Other | Attending: Hematology & Oncology

## 2018-06-20 ENCOUNTER — Inpatient Hospital Stay: Payer: Medicare Other

## 2018-06-20 ENCOUNTER — Other Ambulatory Visit: Payer: Self-pay

## 2018-06-20 ENCOUNTER — Encounter: Payer: Self-pay | Admitting: Hematology & Oncology

## 2018-06-20 VITALS — BP 150/88 | HR 79 | Temp 97.6°F | Resp 18 | Wt 219.0 lb

## 2018-06-20 DIAGNOSIS — Z79899 Other long term (current) drug therapy: Secondary | ICD-10-CM | POA: Insufficient documentation

## 2018-06-20 DIAGNOSIS — Z8572 Personal history of non-Hodgkin lymphomas: Secondary | ICD-10-CM | POA: Diagnosis not present

## 2018-06-20 DIAGNOSIS — C9310 Chronic myelomonocytic leukemia not having achieved remission: Secondary | ICD-10-CM

## 2018-06-20 DIAGNOSIS — D6481 Anemia due to antineoplastic chemotherapy: Secondary | ICD-10-CM

## 2018-06-20 DIAGNOSIS — Z9221 Personal history of antineoplastic chemotherapy: Secondary | ICD-10-CM

## 2018-06-20 DIAGNOSIS — C8333 Diffuse large B-cell lymphoma, intra-abdominal lymph nodes: Secondary | ICD-10-CM

## 2018-06-20 DIAGNOSIS — T451X5A Adverse effect of antineoplastic and immunosuppressive drugs, initial encounter: Secondary | ICD-10-CM

## 2018-06-20 LAB — CMP (CANCER CENTER ONLY)
ALT: 17 U/L (ref 0–44)
AST: 19 U/L (ref 15–41)
Albumin: 3.9 g/dL (ref 3.5–5.0)
Alkaline Phosphatase: 56 U/L (ref 38–126)
Anion gap: 8 (ref 5–15)
BUN: 26 mg/dL — ABNORMAL HIGH (ref 8–23)
CO2: 27 mmol/L (ref 22–32)
CREATININE: 1.5 mg/dL — AB (ref 0.61–1.24)
Calcium: 9.2 mg/dL (ref 8.9–10.3)
Chloride: 105 mmol/L (ref 98–111)
GFR, Est AFR Am: 47 mL/min — ABNORMAL LOW (ref >60)
GFR, Estimated: 41 mL/min — ABNORMAL LOW (ref >60)
Glucose, Bld: 134 mg/dL — ABNORMAL HIGH (ref 70–99)
Potassium: 3.7 mmol/L (ref 3.5–5.1)
Sodium: 140 mmol/L (ref 135–145)
Total Bilirubin: 0.7 mg/dL (ref 0.3–1.2)
Total Protein: 7.2 g/dL (ref 6.5–8.1)

## 2018-06-20 LAB — RETICULOCYTES
Immature Retic Fract: 8.8 % (ref 2.3–15.9)
RBC.: 3.15 MIL/uL — ABNORMAL LOW (ref 4.22–5.81)
Retic Count, Absolute: 24.3 10*3/uL (ref 19.0–186.0)
Retic Ct Pct: 0.8 % (ref 0.4–3.1)

## 2018-06-20 LAB — CBC WITH DIFFERENTIAL (CANCER CENTER ONLY)
ABS IMMATURE GRANULOCYTES: 0.02 10*3/uL (ref 0.00–0.07)
Basophils Absolute: 0 10*3/uL (ref 0.0–0.1)
Basophils Relative: 1 %
Eosinophils Absolute: 0.1 10*3/uL (ref 0.0–0.5)
Eosinophils Relative: 1 %
HEMATOCRIT: 34 % — AB (ref 39.0–52.0)
HEMOGLOBIN: 10.9 g/dL — AB (ref 13.0–17.0)
Immature Granulocytes: 0 %
Lymphocytes Relative: 41 %
Lymphs Abs: 2 10*3/uL (ref 0.7–4.0)
MCH: 34.6 pg — ABNORMAL HIGH (ref 26.0–34.0)
MCHC: 32.1 g/dL (ref 30.0–36.0)
MCV: 107.9 fL — ABNORMAL HIGH (ref 80.0–100.0)
Monocytes Absolute: 1.6 10*3/uL — ABNORMAL HIGH (ref 0.1–1.0)
Monocytes Relative: 34 %
NEUTROS ABS: 1.1 10*3/uL — AB (ref 1.7–7.7)
Neutrophils Relative %: 23 %
Platelet Count: 98 10*3/uL — ABNORMAL LOW (ref 150–400)
RBC: 3.15 MIL/uL — ABNORMAL LOW (ref 4.22–5.81)
RDW: 14.3 % (ref 11.5–15.5)
WBC Count: 4.8 10*3/uL (ref 4.0–10.5)
nRBC: 0 % (ref 0.0–0.2)

## 2018-06-20 LAB — IRON AND TIBC
Iron: 125 ug/dL (ref 42–163)
Saturation Ratios: 53 % (ref 20–55)
TIBC: 235 ug/dL (ref 202–409)
UIBC: 110 ug/dL — ABNORMAL LOW (ref 117–376)

## 2018-06-20 LAB — FERRITIN: Ferritin: 274 ng/mL (ref 24–336)

## 2018-06-20 MED ORDER — DARBEPOETIN ALFA 300 MCG/0.6ML IJ SOSY
300.0000 ug | PREFILLED_SYRINGE | Freq: Once | INTRAMUSCULAR | Status: AC
  Administered 2018-06-20: 300 ug via SUBCUTANEOUS

## 2018-06-20 MED ORDER — DARBEPOETIN ALFA 300 MCG/0.6ML IJ SOSY
PREFILLED_SYRINGE | INTRAMUSCULAR | Status: AC
  Filled 2018-06-20: qty 0.6

## 2018-06-20 NOTE — Progress Notes (Signed)
Hematology and Oncology Follow Up Visit  Jeremiah Walker [id number] Sep 13, 1927 84 y.o. 06/20/2018   Principle Diagnosis:    History of diffuse large cell non-Hodgkin's lymphoma-treated in [place]  Chronic Myelomonocytic Leukemia (CMMoL) - Normal cytogenetics  Current Therapy:    Aranesp 300 mcg sq q 3 weeks for Hgb < 11     Interim History:  Jeremiah Walker is back for follow-up.  He is feeling better.  He said the last Aranesp injection that had seemed to help him have more energy.  He has been exercising 3 times a week.  He had a very nice Christmas.  He is with his family.  It was nice that there were 4 or 5 generations there with him.  So far, there is been no evidence that his chronic myelomonocytic leukemia has progressed or transformed.  He has had no fever.  There is been no bleeding.  He has had no change in bowel or bladder habits.  He has had no headache.  There is been no rashes.    Overall, his performance status is ECOG 1.    Medications:  Current Outpatient Medications:  .  allopurinol (ZYLOPRIM) 100 MG tablet, Take 2 tablets (200 mg total) by mouth daily., Disp: 180 tablet, Rfl: 3 .  ALPRAZolam (XANAX) 0.5 MG tablet, TAKE 1 TABLET(0.5 MG) BY MOUTH THREE TIMES DAILY AS NEEDED FOR ANXIETY, Disp: 90 tablet, Rfl: 3 .  amoxicillin (AMOXIL) 500 MG capsule, Take 4 caps (2gm) by mouth once prior to dental procedure, Disp: 20 capsule, Rfl: 0 .  azelastine (ASTELIN) 0.1 % nasal spray, Place 2 sprays into both nostrils 2 (two) times daily. Use in each nostril as directed, Disp: 30 mL, Rfl: 11 .  b complex vitamins tablet, Take 1 tablet by mouth daily., Disp: , Rfl:  .  bethanechol (URECHOLINE) 25 MG tablet, Take 1 tablet (25 mg total) by mouth 3 (three) times daily., Disp: 90 tablet, Rfl: 11 .  Calcium 500 MG tablet, Take 600 mg by mouth daily., Disp: , Rfl:  .  Cholecalciferol (VITAMIN D3) 2000 UNITS capsule, Take 2,000 Units by mouth daily., Disp: , Rfl:  .  gabapentin  (NEURONTIN) 300 MG capsule, TAKE 1 CAPSULES BY MOUTH THREE TIMES DAILY, PLUS 2 CAPSULES EVERY DAY AS NEEDED, Disp: 150 capsule, Rfl: 5 .  HYDROcodone-acetaminophen (NORCO) 7.5-325 MG tablet, Take 1 tablet by mouth every 6 (six) hours as needed for moderate pain., Disp: 100 tablet, Rfl: 0 .  Magnesium Oxide 200 MG TABS, Take by mouth., Disp: , Rfl:  .  Multiple Vitamin (MULTIVITAMIN) tablet, Take 1 tablet by mouth daily., Disp: , Rfl:  .  Omega-3 Fatty Acids (OMEGA-3 FISH OIL PO), Take 2 each by mouth every morning. EPA/DHA 520/350, Disp: , Rfl:  .  polyethylene glycol (MIRALAX / GLYCOLAX) packet, Take 17 g by mouth daily as needed., Disp: , Rfl:  .  predniSONE (DELTASONE) 20 MG tablet, TAKE 1 OR 2 TABLETS BY MOUTH DAILY FOR UP TO 3 DAYS AS NEEDED FOR GOUT FLARES, Disp: 20 tablet, Rfl: 0 .  psyllium (METAMUCIL) 58.6 % powder, Take 1 packet by mouth daily. 1 Teaspoon daily, Disp: , Rfl:  .  UNABLE TO FIND, Med Name: B-6 VITAMIN, Disp: , Rfl:  .  vitamin C (ASCORBIC ACID) 500 MG tablet, Take 500 mg by mouth daily., Disp: , Rfl:  .  zinc gluconate 50 MG tablet, Take 50 mg by mouth daily., Disp: , Rfl:   Allergies:  Allergies  Allergen  Reactions  . Nsaids Other (See Comments) and Tinitus    Bruising   . Tolmetin Other (See Comments)    Bruising  . Ciprofloxacin Other (See Comments)    Other reaction(s): Other (See Comments) Achilles tendon Achilles tendon Achilles tendon Achilles tendon  . Statins Nausea And Vomiting and Other (See Comments)    Other reaction(s): Kidney issues Muscle pain Muscle pain Other reaction(s): Kidney issues Muscle pain Muscle pain Other reaction(s): Kidney issues Muscle pain Muscle pain Other reaction(s): Kidney issues Muscle pain  . Requip [Ropinirole Hcl] Anxiety  . Ropinirole Anxiety    Mood swing    Past Medical History, Surgical history, Social history, and Family History were reviewed and updated.  Review of Systems: Review of Systems    Constitutional: Positive for malaise/fatigue.  HENT: Negative.   Eyes: Negative.   Respiratory: Positive for cough.   Cardiovascular: Negative.   Gastrointestinal: Negative.   Genitourinary: Negative.   Musculoskeletal: Negative.   Skin: Negative.   Neurological: Negative.   Endo/Heme/Allergies: Negative.   Psychiatric/Behavioral: Negative.      Physical Exam:  weight is 219 lb (99.3 kg). His oral temperature is 97.6 F (36.4 C). His blood pressure is 150/88 (abnormal) and his pulse is 79. His respiration is 18 and oxygen saturation is 100%.   Wt Readings from Last 3 Encounters:  06/20/18 219 lb (99.3 kg)  06/04/18 220 lb (99.8 kg)  05/15/18 218 lb (98.9 kg)     Physical Exam Vitals signs reviewed.  HENT:     Head: Normocephalic and atraumatic.  Eyes:     Pupils: Pupils are equal, round, and reactive to light.  Neck:     Musculoskeletal: Normal range of motion.  Cardiovascular:     Rate and Rhythm: Normal rate and regular rhythm.     Heart sounds: Murmur present.     Comments: In listening to his cardiac exam, he has a 2/6 systolic ejection murmur. Pulmonary:     Effort: Pulmonary effort is normal.     Breath sounds: Normal breath sounds.  Abdominal:     General: Bowel sounds are normal.     Palpations: Abdomen is soft.  Musculoskeletal: Normal range of motion.        General: No tenderness or deformity.  Lymphadenopathy:     Cervical: No cervical adenopathy.  Skin:    General: Skin is warm and dry.     Findings: No erythema or rash.  Neurological:     Mental Status: He is alert and oriented to person, place, and time.  Psychiatric:        Behavior: Behavior normal.        Thought Content: Thought content normal.        Judgment: Judgment normal.     Lab Results  Component Value Date   WBC 4.8 06/20/2018   HGB 10.9 (L) 06/20/2018   HCT 34.0 (L) 06/20/2018   MCV 107.9 (H) 06/20/2018   PLT 98 (L) 06/20/2018     Chemistry      Component Value  Date/Time   NA 140 06/20/2018 0800   NA 139 05/08/2017 1123   NA 141 05/15/2016 1126   K 3.7 06/20/2018 0800   K 4.0 05/08/2017 1123   K 4.3 05/15/2016 1126   CL 105 06/20/2018 0800   CL 105 05/08/2017 1123   CO2 27 06/20/2018 0800   CO2 30 05/08/2017 1123   CO2 26 05/15/2016 1126   BUN 26 (H) 06/20/2018 0800   BUN  15 05/08/2017 1123   BUN 29.1 (H) 05/15/2016 1126   CREATININE 1.50 (H) 06/20/2018 0800   CREATININE 1.2 05/08/2017 1123   CREATININE 1.7 (H) 05/15/2016 1126   GLU 90 11/15/2014      Component Value Date/Time   CALCIUM 9.2 06/20/2018 0800   CALCIUM 9.1 05/08/2017 1123   CALCIUM 9.7 05/15/2016 1126   ALKPHOS 56 06/20/2018 0800   ALKPHOS 64 05/08/2017 1123   ALKPHOS 69 05/15/2016 1126   AST 19 06/20/2018 0800   AST 23 05/15/2016 1126   ALT 17 06/20/2018 0800   ALT 19 05/08/2017 1123   ALT 22 05/15/2016 1126   BILITOT 0.7 06/20/2018 0800   BILITOT 0.84 05/15/2016 1126         Impression and Plan: Jeremiah Walker is a 84 year old white male. He had a history of diffuse large cell non-Hodgkin's lymphoma. He was treated  with 6 cycles of R-CHOP. He got this treatment up in [place]. He completed this back in April 2014.   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 This could be a residual from his chemotherapy for his lymphoma.  I will go ahead and give him some Aranesp today.  Again I want to make sure that we get his hemoglobin above 11.  I will plan to have him come back in 4 weeks.  Hopefully, we will get him to continue to feel well for the next decade.  He is excited that he made it through another decade.    [name], MD 12/27/20198:35 AM

## 2018-07-02 ENCOUNTER — Other Ambulatory Visit: Payer: Self-pay | Admitting: Family Medicine

## 2018-07-02 DIAGNOSIS — G894 Chronic pain syndrome: Secondary | ICD-10-CM

## 2018-07-03 MED ORDER — HYDROCODONE-ACETAMINOPHEN 7.5-325 MG PO TABS: 1.0000 | ORAL_TABLET | Freq: Four times a day (QID) | ORAL | 0 refills | Status: DC | PRN

## 2018-07-03 NOTE — Telephone Encounter (Signed)
Patient seen in the office last month UDS is up-to-date  Okay to refill today [place]:  06/03/2018  1   06/03/2018  Alprazolam 0.5 Mg Tablet  90.00 30 Je Cop  [id number]  [facility] ([id number])  0/3 3.00 LME Comm Ins  [place]  05/29/2018  1   05/29/2018  Hydrocodone-Acetamin 7.5-325  100.00 25 Je Cop  [id number]  [facility] ([id number])  0/0 30.00 MME Comm Ins  [place]  04/29/2018  1   02/28/2018  Alprazolam 0.5 Mg Tablet  90.00 30 Je Cop  [id number]  [facility] ([id number])  2/2 3.00 LME Comm Ins  [place]  04/21/2018  1   04/21/2018  Hydrocodone-Acetamin 7.5-325  100.00 25 Je Cop  [id number]  [facility] ([id number])  0/0 30.00 MME Comm Ins  [place]  03/30/2018  1   02/28/2018  Alprazolam 0.5 Mg Tablet  90.00 30 Je Cop  [id number]  [facility] ([id number])  1/2 3.00 LME Comm Ins  [place]  02/28/2018  1   02/28/2018  Alprazolam 0.5 Mg Tablet  90.00 30 Je Cop  [id number]  [facility] ([id number])  0/2 3.00 LME Comm Ins  [place]  02/26/2018  1   02/26/2018  Hydrocodone-Acetamin 7.5-325  100.00 25 Je Cop  [id number]  [facility] ([id number])  0/0 30.00 MME Comm Ins  [place]  01/28/2018  1   11/26/2017  Alprazolam 0.5 Mg Tablet  90.00 30 Je Cop  [id number]  [facility] ([id number])  2/2 3.00 LME Comm Ins  [place]  01/06/2018  1   01/06/2018  Hydrocodone-Acetamin 7.5-325  100.00 25 Je Cop  [id number]  [facility] ([id number])  0/0 30.

## 2018-07-07 ENCOUNTER — Telehealth (INDEPENDENT_AMBULATORY_CARE_PROVIDER_SITE_OTHER): Payer: Self-pay | Admitting: Physical Medicine and Rehabilitation

## 2018-07-07 NOTE — Telephone Encounter (Signed)
OV with trigger point injection

## 2018-07-08 NOTE — Telephone Encounter (Signed)
Can you call the patient to schedule?

## 2018-07-08 NOTE — Telephone Encounter (Signed)
Pt is scheduled for OV 07/22/2018, pt is placed on waitlist.

## 2018-07-18 ENCOUNTER — Inpatient Hospital Stay: Payer: Medicare Other

## 2018-07-18 ENCOUNTER — Encounter: Payer: Self-pay | Admitting: Family

## 2018-07-18 ENCOUNTER — Telehealth: Payer: Self-pay | Admitting: Hematology & Oncology

## 2018-07-18 ENCOUNTER — Inpatient Hospital Stay: Payer: Medicare Other | Attending: Hematology & Oncology | Admitting: Family

## 2018-07-18 ENCOUNTER — Other Ambulatory Visit: Payer: Self-pay

## 2018-07-18 VITALS — BP 125/63 | HR 71 | Temp 98.2°F | Resp 20 | Wt 222.0 lb

## 2018-07-18 DIAGNOSIS — M545 Low back pain: Secondary | ICD-10-CM | POA: Insufficient documentation

## 2018-07-18 DIAGNOSIS — G629 Polyneuropathy, unspecified: Secondary | ICD-10-CM

## 2018-07-18 DIAGNOSIS — C8333 Diffuse large B-cell lymphoma, intra-abdominal lymph nodes: Secondary | ICD-10-CM

## 2018-07-18 DIAGNOSIS — Z8572 Personal history of non-Hodgkin lymphomas: Secondary | ICD-10-CM | POA: Insufficient documentation

## 2018-07-18 DIAGNOSIS — C9310 Chronic myelomonocytic leukemia not having achieved remission: Secondary | ICD-10-CM | POA: Diagnosis not present

## 2018-07-18 DIAGNOSIS — I10 Essential (primary) hypertension: Secondary | ICD-10-CM | POA: Diagnosis not present

## 2018-07-18 DIAGNOSIS — D508 Other iron deficiency anemias: Secondary | ICD-10-CM

## 2018-07-18 LAB — CMP (CANCER CENTER ONLY)
ALK PHOS: 54 U/L (ref 38–126)
ALT: 15 U/L (ref 0–44)
AST: 20 U/L (ref 15–41)
Albumin: 4.3 g/dL (ref 3.5–5.0)
Anion gap: 6 (ref 5–15)
BUN: 22 mg/dL (ref 8–23)
CO2: 30 mmol/L (ref 22–32)
Calcium: 10.3 mg/dL (ref 8.9–10.3)
Chloride: 106 mmol/L (ref 98–111)
Creatinine: 1.42 mg/dL — ABNORMAL HIGH (ref 0.61–1.24)
GFR, Est AFR Am: 51 mL/min — ABNORMAL LOW (ref >60)
GFR, Estimated: 44 mL/min — ABNORMAL LOW (ref >60)
Glucose, Bld: 104 mg/dL — ABNORMAL HIGH (ref 70–99)
Potassium: 4.2 mmol/L (ref 3.5–5.1)
SODIUM: 142 mmol/L (ref 135–145)
Total Bilirubin: 0.8 mg/dL (ref 0.3–1.2)
Total Protein: 6.9 g/dL (ref 6.5–8.1)

## 2018-07-18 LAB — CBC WITH DIFFERENTIAL (CANCER CENTER ONLY)
Abs Immature Granulocytes: 0.04 10*3/uL (ref 0.00–0.07)
BASOS PCT: 1 %
Basophils Absolute: 0.1 10*3/uL (ref 0.0–0.1)
Eosinophils Absolute: 0.1 10*3/uL (ref 0.0–0.5)
Eosinophils Relative: 1 %
HCT: 33.8 % — ABNORMAL LOW (ref 39.0–52.0)
Hemoglobin: 11 g/dL — ABNORMAL LOW (ref 13.0–17.0)
IMMATURE GRANULOCYTES: 1 %
Lymphocytes Relative: 37 %
Lymphs Abs: 2.3 10*3/uL (ref 0.7–4.0)
MCH: 35.8 pg — ABNORMAL HIGH (ref 26.0–34.0)
MCHC: 32.5 g/dL (ref 30.0–36.0)
MCV: 110.1 fL — ABNORMAL HIGH (ref 80.0–100.0)
Monocytes Absolute: 2.5 10*3/uL — ABNORMAL HIGH (ref 0.1–1.0)
Monocytes Relative: 40 %
NEUTROS PCT: 20 %
Neutro Abs: 1.2 10*3/uL — ABNORMAL LOW (ref 1.7–7.7)
PLATELETS: 85 10*3/uL — AB (ref 150–400)
RBC: 3.07 MIL/uL — ABNORMAL LOW (ref 4.22–5.81)
RDW: 15.2 % (ref 11.5–15.5)
WBC Count: 6.2 10*3/uL (ref 4.0–10.5)
nRBC: 0 % (ref 0.0–0.2)

## 2018-07-18 LAB — RETICULOCYTES
Immature Retic Fract: 12.7 % (ref 2.3–15.9)
RBC.: 3.07 MIL/uL — ABNORMAL LOW (ref 4.22–5.81)
Retic Count, Absolute: 33.2 10*3/uL (ref 19.0–186.0)
Retic Ct Pct: 1.1 % (ref 0.4–3.1)

## 2018-07-18 LAB — SAVE SMEAR(SSMR), FOR PROVIDER SLIDE REVIEW

## 2018-07-18 MED ORDER — DARBEPOETIN ALFA 300 MCG/0.6ML IJ SOSY
PREFILLED_SYRINGE | INTRAMUSCULAR | Status: AC
  Filled 2018-07-18: qty 0.6

## 2018-07-18 NOTE — Telephone Encounter (Signed)
Appointments scheduled letter/calendar mailed per 1/24 los

## 2018-07-18 NOTE — Progress Notes (Signed)
Hematology and Oncology Follow Up Visit  Jeremiah Walker [id number] 21-Dec-1927 85 y.o. 07/18/2018   Principle Diagnosis:  History of diffuse large cell non-Hodgkin's lymphoma - treated in [place] ([facility]) - Normal cytogenetics  Current Therapy:   Aranesp 300 mcg sq q 3 weeks for Hgb < 11   Interim History:  Jeremiah Walker is here today for follow-up. He is having lower back pain and has an appointment with his orthopedist next week on Tuesday for injection.  He is ambulating with a cane for support and denies any falls or syncopal episodes.  The neuropathy in his hands and feet seems to be slightly better. He is taking Neurontin 300 mg PO BID.  No swelling in his extremities at this time.  No fever, chills, n/v, cough, rash, dizziness, SOB, chest pain, palpitations, abdominal pain or changes in bowel or bladder habits.  No lymphadenopathy noted on exam.  No episodes of bleeding. His platelet count today is 85 (98 at last visit). He has had some mild bruising.  Hgb is stable at 11.0. He has maintained a good appetite and is staying well hydrated. His weight is stable.   ECOG Performance Status: 1 - Symptomatic but completely ambulatory  Medications:  Allergies as of 07/18/2018      Reactions   Nsaids Other (See Comments), Tinitus   Bruising   Tolmetin Other (See Comments)   Bruising   Ciprofloxacin Other (See Comments)   Other reaction(s): Other (See Comments) Achilles tendon Achilles tendon Achilles tendon Achilles tendon   Statins Nausea And Vomiting, Other (See Comments)   Other reaction(s): Kidney issues Muscle pain Muscle pain Other reaction(s): Kidney issues Muscle pain Muscle pain Other reaction(s): Kidney issues Muscle pain Muscle pain Other reaction(s): Kidney issues Muscle pain   Requip [ropinirole Hcl] Anxiety   Ropinirole Anxiety   Mood swing      Medication List       Accurate as of July 18, 2018 10:44 AM. Always  use your most recent med list.        allopurinol 100 MG tablet Commonly known as:  ZYLOPRIM Take 2 tablets (200 mg total) by mouth daily.   ALPRAZolam 0.5 MG tablet Commonly known as:  XANAX TAKE 1 TABLET(0.5 MG) BY MOUTH THREE TIMES DAILY AS NEEDED FOR ANXIETY   amoxicillin 500 MG capsule Commonly known as:  AMOXIL Take 4 caps (2gm) by mouth once prior to dental procedure   azelastine 0.1 % nasal spray Commonly known as:  ASTELIN Place 2 sprays into both nostrils 2 (two) times daily. Use in each nostril as directed   b complex vitamins tablet Take 1 tablet by mouth daily.   bethanechol 25 MG tablet Commonly known as:  URECHOLINE Take 1 tablet (25 mg total) by mouth 3 (three) times daily.   Calcium 500 MG tablet Take 600 mg by mouth daily.   gabapentin 300 MG capsule Commonly known as:  NEURONTIN TAKE 1 CAPSULES BY MOUTH THREE TIMES DAILY, PLUS 2 CAPSULES EVERY DAY AS NEEDED   HYDROcodone-acetaminophen 7.5-325 MG tablet Commonly known as:  NORCO Take 1 tablet by mouth every 6 (six) hours as needed for moderate pain.   Magnesium Oxide 200 MG Tabs Take by mouth.   multivitamin tablet Take 1 tablet by mouth daily.   OMEGA-3 FISH OIL PO Take 2 each by mouth every morning. EPA/DHA 520/350   polyethylene glycol packet Commonly known as:  MIRALAX / GLYCOLAX Take 17 g by mouth daily as needed.  predniSONE 20 MG tablet Commonly known as:  DELTASONE TAKE 1 OR 2 TABLETS BY MOUTH DAILY FOR UP TO 3 DAYS AS NEEDED FOR GOUT FLARES   psyllium 58.6 % powder Commonly known as:  METAMUCIL Take 1 packet by mouth daily. 1 Teaspoon daily   UNABLE TO FIND Med Name: B-6 VITAMIN   vitamin C 500 MG tablet Commonly known as:  ASCORBIC ACID Take 500 mg by mouth daily.   Vitamin D3 50 MCG (2000 UT) capsule Take 2,000 Units by mouth daily.   zinc gluconate 50 MG tablet Take 50 mg by mouth daily.       Allergies:  Allergies  Allergen Reactions  . Nsaids Other (See  Comments) and Tinitus    Bruising   . Tolmetin Other (See Comments)    Bruising  . Ciprofloxacin Other (See Comments)    Other reaction(s): Other (See Comments) Achilles tendon Achilles tendon Achilles tendon Achilles tendon  . Statins Nausea And Vomiting and Other (See Comments)    Other reaction(s): Kidney issues Muscle pain Muscle pain Other reaction(s): Kidney issues Muscle pain Muscle pain Other reaction(s): Kidney issues Muscle pain Muscle pain Other reaction(s): Kidney issues Muscle pain  . Requip [Ropinirole Hcl] Anxiety  . Ropinirole Anxiety    Mood swing    Past Medical History, Surgical history, Social history, and Family History were reviewed and updated.  Review of Systems: All other 10 point review of systems is negative.   Physical Exam:  weight is 222 lb (100.7 kg). His oral temperature is 98.2 F (36.8 C). His blood pressure is 125/63 and his pulse is 71. His respiration is 20 and oxygen saturation is 98%.   Wt Readings from Last 3 Encounters:  07/18/18 222 lb (100.7 kg)  06/20/18 219 lb (99.3 kg)  06/04/18 220 lb (99.8 kg)    Ocular: Sclerae unicteric, pupils equal, round and reactive to light Ear-nose-throat: Oropharynx clear, dentition fair Lymphatic: No cervical, supraclavicular or axillary adenopathy Lungs no rales or rhonchi, good excursion bilaterally Heart regular rate and rhythm, no murmur appreciated Abd soft, nontender, positive bowel sounds, no liver or spleen tip palpated on exam, no fluid wave  MSK no focal spinal tenderness, no joint edema Neuro: non-focal, well-oriented, appropriate affect Breasts: Deferred   Lab Results  Component Value Date   WBC 6.2 07/18/2018   HGB 11.0 (L) 07/18/2018   HCT 33.8 (L) 07/18/2018   MCV 110.1 (H) 07/18/2018   PLT 85 (L) 07/18/2018   Lab Results  Component Value Date   FERRITIN 274 06/20/2018   IRON 125 06/20/2018   TIBC 235 06/20/2018   UIBC 110 (L) 06/20/2018   IRONPCTSAT 53  06/20/2018   Lab Results  Component Value Date   RETICCTPCT 1.1 07/18/2018   RBC 3.07 (L) 07/18/2018   RBC 3.07 (L) 07/18/2018   No results found for: KPAFRELGTCHN, LAMBDASER, KAPLAMBRATIO No results found for: IGGSERUM, IGA, IGMSERUM No results found for: Ronnald Ramp, A1GS, A2GS, Violet Baldy, MSPIKE, SPEI   Chemistry      Component Value Date/Time   NA 140 06/20/2018 0800   NA 139 05/08/2017 1123   NA 141 05/15/2016 1126   K 3.7 06/20/2018 0800   K 4.0 05/08/2017 1123   K 4.3 05/15/2016 1126   CL 105 06/20/2018 0800   CL 105 05/08/2017 1123   CO2 27 06/20/2018 0800   CO2 30 05/08/2017 1123   CO2 26 05/15/2016 1126   BUN 26 (H) 06/20/2018 0800   BUN  15 05/08/2017 1123   BUN 29.1 (H) 05/15/2016 1126   CREATININE 1.50 (H) 06/20/2018 0800   CREATININE 1.2 05/08/2017 1123   CREATININE 1.7 (H) 05/15/2016 1126   GLU 90 11/15/2014      Component Value Date/Time   CALCIUM 9.2 06/20/2018 0800   CALCIUM 9.1 05/08/2017 1123   CALCIUM 9.7 05/15/2016 1126   ALKPHOS 56 06/20/2018 0800   ALKPHOS 64 05/08/2017 1123   ALKPHOS 69 05/15/2016 1126   AST 19 06/20/2018 0800   AST 23 05/15/2016 1126   ALT 17 06/20/2018 0800   ALT 19 05/08/2017 1123   ALT 22 05/15/2016 1126   BILITOT 0.7 06/20/2018 0800   BILITOT 0.84 05/15/2016 1126       Impression and Plan: Jeremiah Walker is a very pleasant 85 yo caucasian gentleman with history of diffuse large cell non-Hodgkin's lymphoma treated with 6 cycles of R-CHOP completed in April 2014 while he was living in [place].  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  He is doing well but is having pain in the back. He is following up with his orthopedist next week for injection.  Hgb is stable at 11.0 so no Aranesp needed this visit.  With his decrease in platelet count he knows to contact our office if he notes any episodes of bleeding and to go to the ED in the event of an emergency.  We will continue to follow along  closely with him and see him back in another months.  He will contact our office with any questions or concerns. We can certainly see him sooner if need be.   Laverna Peace, NP 1/24/202010:44 AM

## 2018-07-21 LAB — IRON AND TIBC
Iron: 132 ug/dL (ref 42–163)
Saturation Ratios: 56 % — ABNORMAL HIGH (ref 20–55)
TIBC: 236 ug/dL (ref 202–409)
UIBC: 104 ug/dL — ABNORMAL LOW (ref 117–376)

## 2018-07-21 LAB — FERRITIN: FERRITIN: 263 ng/mL (ref 24–336)

## 2018-07-22 ENCOUNTER — Ambulatory Visit (INDEPENDENT_AMBULATORY_CARE_PROVIDER_SITE_OTHER): Payer: Medicare Other | Admitting: Physical Medicine and Rehabilitation

## 2018-07-22 ENCOUNTER — Encounter

## 2018-07-22 ENCOUNTER — Encounter (INDEPENDENT_AMBULATORY_CARE_PROVIDER_SITE_OTHER): Payer: Self-pay | Admitting: Physical Medicine and Rehabilitation

## 2018-07-22 VITALS — BP 128/61 | HR 78 | Ht 69.0 in | Wt 215.0 lb

## 2018-07-22 DIAGNOSIS — M545 Low back pain, unspecified: Secondary | ICD-10-CM

## 2018-07-22 DIAGNOSIS — M25511 Pain in right shoulder: Secondary | ICD-10-CM | POA: Diagnosis not present

## 2018-07-22 DIAGNOSIS — M7918 Myalgia, other site: Secondary | ICD-10-CM

## 2018-07-22 DIAGNOSIS — G8929 Other chronic pain: Secondary | ICD-10-CM | POA: Diagnosis not present

## 2018-07-22 NOTE — Progress Notes (Signed)
.  Numeric Pain Rating Scale and Functional Assessment Average Pain 7 Pain Right Now 2 My pain is constant and sharp Pain is worse with: walking and bending Pain improves with: nothing   In the last MONTH (on 0-10 scale) has pain interfered with the following?  1. General activity like being  able to carry out your everyday physical activities such as walking, climbing stairs, carrying groceries, or moving a chair?  Rating(6)  2. Relation with others like being able to carry out your usual social activities and roles such as  activities at home, at work and in your community. Rating(6)  3. Enjoyment of life such that you have  been bothered by emotional problems such as feeling anxious, depressed or irritable?  Rating(4)

## 2018-07-23 ENCOUNTER — Encounter (INDEPENDENT_AMBULATORY_CARE_PROVIDER_SITE_OTHER): Payer: Self-pay | Admitting: Physical Medicine and Rehabilitation

## 2018-07-23 DIAGNOSIS — G8929 Other chronic pain: Secondary | ICD-10-CM

## 2018-07-23 DIAGNOSIS — M7918 Myalgia, other site: Secondary | ICD-10-CM | POA: Diagnosis not present

## 2018-07-23 DIAGNOSIS — M545 Low back pain: Secondary | ICD-10-CM

## 2018-07-23 DIAGNOSIS — M25511 Pain in right shoulder: Secondary | ICD-10-CM | POA: Diagnosis not present

## 2018-07-23 MED ORDER — LIDOCAINE HCL (PF) 1 % IJ SOLN
3.0000 mL | INTRAMUSCULAR | Status: AC | PRN
  Administered 2018-07-23: 3 mL

## 2018-07-23 MED ORDER — METHYLPREDNISOLONE ACETATE 40 MG/ML IJ SUSP
40.0000 mg | INTRAMUSCULAR | Status: AC | PRN
  Administered 2018-07-23: 40 mg via INTRAMUSCULAR

## 2018-07-23 NOTE — Progress Notes (Signed)
Jeremiah Walker - 85 y.o. male MRN [id number]  Date of birth: 1927-07-14  Office Visit Note: Visit Date: 07/22/2018 PCP: [name], MD Referred by: [name], MD  Subjective: Chief Complaint  Patient presents with  . Lower Back - Pain   HPI: Jeremiah Walker is a 85 y.o. male who comes in today For evaluation management of acute on chronic axial low back pain left-sided.  He reports that a couple weeks ago he began having severe left-sided low back pain really in the same area as before which is near the iliac crest at about the L4 level.  He reports some routine exercises that he does at [place] where he lives.  He reports 1 day waking up and having just severe pain on the left side without other specific trauma.  No leg pain no focal weakness.  He reports the pain has been quite significant.  He does use some hydrocodone chronically but this is been greatly reduced since we have been seeing him.  He takes one at night but has been taking 2 since this pain flared up.  Really unable to take anti-inflammatories.  His history with [place] is that he had facet joint blocks which were actually pretty beneficial at first for more of the chronic standing pain that he had but he went on to have more pain over specific points and trigger point injection gave him so much relief that he was ecstatic about not having that specific pain.  He was able to actually reduce his narcotic usage after that.  He has been doing well up until recently.  He has had no fevers chills or night sweats.  No other red flag complaints of bowel bladder issues etc.  Case is complicated with high BMI of 31.7.  He is also followed by Dr. Joni Fears more for his right shoulder.  He complains of right shoulder and shoulder blade pain and wonders if it is also more of a myofascial pain.  He has not had any prior cervical surgery or cervical MRI to look at C5 type radicular symptoms.  He has no pain down the  arms.  Appropriate Use Addendum Patient was evaluated for low back pain and according to appropriate use criteria (did not) receive imaging tests.  Review of Systems  Musculoskeletal: Positive for back pain and joint pain.   Otherwise per HPI.  Assessment & Plan: Visit Diagnoses:  1. Chronic left-sided low back pain without sciatica   2. Myofascial pain syndrome   3. Chronic right shoulder pain     Plan: Findings:  Acute on chronic axial low back pain predominantly to the left.  Combination of mechanical facet arthropathy and myofascial trigger point.  So this is likely Duda posterior and activity level.  I encouraged him still to do the exercises at [facility] as I think this is helping him in general although he may have periods where it flares up.  He has not had specific physical therapy there and I did write a prescription for physical therapy there for them to try to help get him back on track with some specific exercises.  We did complete local trigger point injection today.  Depending on relief would look at either repeat trigger point in a different location versus specific dry needling at physical therapy.  Physical therapy also written for his right scapular pain which is likely myofascial pain.  If that is not beneficial to him at some point may look  at cervical spine MRI although it is coming show degenerative changes for his age.  Could be C5 radicular type pain but would probably look a trigger point injection around the rhomboid region or teres minor.    Meds & Orders: No orders of the defined types were placed in this encounter.   Orders Placed This Encounter  Procedures  . Trigger Point Inj    Follow-up: No follow-ups on file.   Procedures: Trigger Point Inj Date/Time: 07/23/2018 5:51 AM Performed by: Magnus Sinning, MD Authorized by: Magnus Sinning, MD   Consent Given by:  Patient Site marked: the procedure site was marked   Timeout: prior to procedure the  correct patient, procedure, and site was verified   Indications:  Pain Total # of Trigger Points:  1 Location: back   Needle Size:  25 G Approach:  Dorsal and lateral Medications #1:  40 mg methylPREDNISolone acetate 40 MG/ML; 3 mL lidocaine (PF) 1 % Patient tolerance:  Patient tolerated the procedure well with no immediate complications Comments: Injection located only at the juncture of the latissimus dorsi and the iliac crest medially to the top of this area with a focal trigger point which was palpated and needled.    No notes on file   Clinical History: Lumbar spine MRI dated 05/29/2016   L4-5: Anterolisthesis, uncovered disc with small bulge, central protrusion, moderate facet and ligamentum flavum hypertrophy, and large right foraminal and extra foraminal marginal osteophyte. Severe right foraminal narrowing. Mild left foraminal narrowing. Effacement of lateral recesses with contact upon the descending right L5 nerve root. No significant canal stenosis.   L5-S1: Small disc bulge and mild facet/ligamentum flavum hypertrophy. Mild bilateral foraminal narrowing. Right-sided 5 mm T2 hyperintense structure arising from the right facet joint possibly impinging the exiting right L5 nerve root likely a synovial cyst  Mild S-shaped lumbar curvature, grade 1 L4-5 anterolisthesis, and prominent discogenic and facet arthropathy. No acute osseous abnormality.  No high-grade canal stenosis. 3. Multiple levels of foraminal narrowing moderate at left L2-3, moderate at right L3-4, and severe at right L4-5 levels. 4. Large marginal osteophytes with contact upon exiting left L2 nerve root at the L2-3 level and right L3 nerve root at the L3-4 level.    He reports that he has quit smoking. He has never used smokeless tobacco. No results for input(s): HGBA1C, LABURIC in the last 8760 hours.  Objective:  VS:  HT:5\' 9"  (175.3 cm)   WT:215 lb (97.5 kg)  BMI:31.74    BP:128/61  HR:78bpm   TEMP: ( )  RESP:  Physical Exam Vitals signs and nursing note reviewed.  Constitutional:      General: He is not in acute distress.    Appearance: Normal appearance. He is well-developed.  HENT:     Head: Normocephalic and atraumatic.  Eyes:     Conjunctiva/sclera: Conjunctivae normal.     Pupils: Pupils are equal, round, and reactive to light.  Neck:     Musculoskeletal: Normal range of motion and neck supple. No neck rigidity.  Cardiovascular:     Rate and Rhythm: Normal rate.     Pulses: Normal pulses.     Heart sounds: Normal heart sounds.  Pulmonary:     Effort: Pulmonary effort is normal. No respiratory distress.  Musculoskeletal:     Right lower leg: No edema.     Left lower leg: No edema.     Comments: Patient ambulates with a cane he is very slow to rise from a  seated position to full extension does have pain with facet loading bilaterally.  He has focal trigger point in the left paraspinal and may be quadratus lumborum but higher, but the tightening of the latissimus dorsi to the iliac crest.  He does ambulate sort of with a slumped over lumbar spine.  He has no pain with hip rotation and he has good distal strength.  Skin:    General: Skin is warm and dry.     Findings: No erythema or rash.  Neurological:     General: No focal deficit present.     Mental Status: He is alert and oriented to person, place, and time.     Sensory: No sensory deficit.     Coordination: Coordination normal.     Gait: Gait abnormal.  Psychiatric:        Mood and Affect: Mood normal.        Behavior: Behavior normal.     Ortho Exam Imaging: No results found.  Past Medical/Family/Surgical/Social History: Medications & Allergies reviewed per EMR, new medications updated. Patient Active Problem List   Diagnosis Date Noted  . Chronic myelomonocytic leukemia not having achieved remission ([place]) 12/04/2016  . Chronic pain syndrome 09/26/2015  . De Quervain's tenosynovitis, right 09/13/2015   . Anemia due to antineoplastic chemotherapy 08/15/2015  . DLBCL (diffuse large B cell lymphoma) ([place]) 08/15/2015  . Skin lesion of face 05/21/2015  . Peripheral neuropathy due to chemotherapy ([place]) 02/09/2015  . Benign prostatic hyperplasia with urinary obstruction 02/09/2015  . Bilateral hearing loss 02/09/2015  . Low back pain 02/09/2015  . Chronic kidney disease (CKD), stage III (moderate) ([place]) 02/09/2015  . Essential (primary) hypertension 02/09/2015  . Gastro-esophageal reflux disease without esophagitis 02/09/2015  . Anxiety, generalized 02/09/2015  . Cannot sleep 02/09/2015  . Combined fat and carbohydrate induced hyperlipemia 02/09/2015   Past Medical History:  Diagnosis Date  . Anemia   . Anemia due to antineoplastic chemotherapy 08/15/2015  . Anxiety   . Arthritis   . Cancer ([place])   . Cataract   . Chronic kidney disease (CKD), stage III (moderate) ([facility]) 02/09/2015   Controlled  . CMML (chronic myelomonocytic leukemia) ([place])   . CMML (chronic myelomonocytic leukemia) ([place])   . Combined fat and carbohydrate induced hyperlipemia 02/09/2015   Controlled  . Depression   . DLBCL (diffuse large B cell lymphoma) ([place]) 08/15/2015  . Essential (primary) hypertension 02/09/2015  . Gastro-esophageal reflux disease without esophagitis 02/09/2015   Controlled  . Neuromuscular disorder ([place])   . Tinnitus    Family History  Problem Relation Age of Onset  . Anuerysm Mother 83       Deceased  . Colon cancer Father 49       Deceased  . Colon cancer Brother   . Prostate cancer Brother        Deceased  . Alzheimer's disease Brother   . Heart disease Brother   . Alzheimer's disease Paternal Grandmother   . Early death Maternal Grandmother        Unknown  . Obesity Son   . Obesity Son        Gastric Bypass  . Healthy Son   . Healthy Daughter   . Diabetes Neg Hx    Past Surgical History:  Procedure Laterality Date  . CATARACT EXTRACTION, BILATERAL    . COLONOSCOPY  May 2011   . CYSTOSCOPY    . TOTAL HIP ARTHROPLASTY  2003   Right   . TOTAL HIP ARTHROPLASTY  April, 2011   Left  . TRIGGER FINGER RELEASE  Nov 2013  . TURBINECTOMY  2006  . UPPER GI ENDOSCOPY  Nov 2015   Social History   Occupational History  . Not on file  Tobacco Use  . Smoking status: Former Research scientist (life sciences)  . Smokeless tobacco: Never Used  . Tobacco comment: Quit >50 years ago  Substance and Sexual Activity  . Alcohol use: Yes    Alcohol/week: 1.0 - 4.0 standard drinks    Types: 1 - 4 Shots of liquor per week    Comment: 4 glasses per week  . Drug use: No  . Sexual activity: Not on file

## 2018-08-02 ENCOUNTER — Other Ambulatory Visit: Payer: Self-pay | Admitting: Family Medicine

## 2018-08-02 DIAGNOSIS — G894 Chronic pain syndrome: Secondary | ICD-10-CM

## 2018-08-04 MED ORDER — HYDROCODONE-ACETAMINOPHEN 7.5-325 MG PO TABS: 1.0000 | ORAL_TABLET | Freq: Four times a day (QID) | ORAL | 0 refills | Status: DC | PRN

## 2018-08-04 NOTE — Telephone Encounter (Signed)
Drug screen is up-to-date, done in June Last seen in the office in December Ok to refill  07/05/2018  1   06/03/2018  Alprazolam 0.5 MG Tablet  90.00 30 Je Cop   [id number]   [facility] ([id number])   1  3.00 LME  Comm Ins   [place]  07/05/2018  1   07/03/2018  Hydrocodone-Acetamin 7.5-325  100.00 25 Je Cop   [id number]   [facility] ([id number])   0  30.00 MME  Comm Ins   [place]  06/03/2018  1   06/03/2018  Alprazolam 0.5 MG Tablet  90.00 30 Je Cop   [id number]   [facility] ([id number])   0  3.00 LME  Comm Ins   [place]  05/29/2018  1   05/29/2018  Hydrocodone-Acetamin 7.5-325  100.00 25 Je Cop   [id number]   [facility] ([id number])   0  30.00 MME  Comm Ins   [place]  04/29/2018  1   02/28/2018  Alprazolam 0.5 MG Tablet  90.00 30 Je Cop   [id number]   [facility] ([id number])   2  3.00 LME  Comm Ins   [place]  04/21/2018  1   04/21/2018  Hydrocodone-Acetamin 7.5-325  100.00 25 Je Cop   [id number]   [facility] ([id number])   0  30.00 MME  Comm Ins   [place]  03/30/2018  1   02/28/2018  Alprazolam 0.5 MG Tablet  90.00 30 Je Cop   [id number]   [facility] ([id number])   1  3.00 LME  Comm Ins   [place]  02/28/2018  1   02/28/2018  Alprazolam 0.5 MG Tablet  90.00 30 Je Cop   [id number]   [facility] ([id number])   0  3.00 LME  Comm Ins   [place]  02/26/2018  1   02/26/2018  Hydrocodone-Acetamin 7.5-325  100.00 25 Je Cop   [id number]   [facility] ([id number])   0  30.00 MME  Comm Ins   [place]  01/28/2018  1   11/26/2017  Alprazolam 0.5 MG Tablet  90.00 30 Je Cop   [id number]   [facility] ([id number])   2  3.00 LME  Comm Ins   [place]

## 2018-08-07 ENCOUNTER — Ambulatory Visit (INDEPENDENT_AMBULATORY_CARE_PROVIDER_SITE_OTHER): Payer: Medicare Other | Admitting: Physical Medicine and Rehabilitation

## 2018-08-07 ENCOUNTER — Encounter (INDEPENDENT_AMBULATORY_CARE_PROVIDER_SITE_OTHER): Payer: Self-pay | Admitting: Physical Medicine and Rehabilitation

## 2018-08-07 VITALS — BP 132/66 | HR 76

## 2018-08-07 DIAGNOSIS — M545 Low back pain, unspecified: Secondary | ICD-10-CM

## 2018-08-07 DIAGNOSIS — G894 Chronic pain syndrome: Secondary | ICD-10-CM

## 2018-08-07 DIAGNOSIS — F1190 Opioid use, unspecified, uncomplicated: Secondary | ICD-10-CM

## 2018-08-07 DIAGNOSIS — M7918 Myalgia, other site: Secondary | ICD-10-CM | POA: Diagnosis not present

## 2018-08-07 DIAGNOSIS — G8929 Other chronic pain: Secondary | ICD-10-CM

## 2018-08-07 NOTE — Progress Notes (Signed)
  Numeric Pain Rating Scale and Functional Assessment Average Pain 6   In the last MONTH (on 0-10 scale) has pain interfered with the following?  1. General activity like being  able to carry out your everyday physical activities such as walking, climbing stairs, carrying groceries, or moving a chair?  Rating(4)     

## 2018-08-08 ENCOUNTER — Encounter (INDEPENDENT_AMBULATORY_CARE_PROVIDER_SITE_OTHER): Payer: Self-pay | Admitting: Physical Medicine and Rehabilitation

## 2018-08-08 DIAGNOSIS — G8929 Other chronic pain: Secondary | ICD-10-CM | POA: Diagnosis not present

## 2018-08-08 DIAGNOSIS — M545 Low back pain: Secondary | ICD-10-CM | POA: Diagnosis not present

## 2018-08-08 DIAGNOSIS — M7918 Myalgia, other site: Secondary | ICD-10-CM | POA: Diagnosis not present

## 2018-08-08 MED ORDER — TRIAMCINOLONE ACETONIDE 40 MG/ML IJ SUSP
40.0000 mg | INTRAMUSCULAR | Status: AC | PRN
  Administered 2018-08-08: 40 mg via INTRAMUSCULAR

## 2018-08-08 MED ORDER — LIDOCAINE HCL 1 % IJ SOLN
3.0000 mL | INTRAMUSCULAR | Status: AC | PRN
  Administered 2018-08-08: 3 mL

## 2018-08-08 NOTE — Progress Notes (Signed)
Jeremiah Walker - 85 y.o. male MRN [id number]  Date of birth: January 08, 1928  Office Visit Note: Visit Date: 08/07/2018 PCP: [name], MD Referred by: [name], MD  Subjective: Chief Complaint  Patient presents with  . Lower Back - Pain   HPI: Jeremiah Walker is a 85 y.o. male who comes in today For reevaluation of his predominantly left lower back pain which is chronic and severe.  We saw him a few weeks ago and completed trigger point injection which was not really beneficial.  Interestingly trigger point injection in the past has been so beneficial he thought it was a miracle that it relieved almost 20 years of back pain.  He reports that he has been sitting in a desk chair that was not really right for him and he has changed chairs at this point and feels like that is helped to a degree.  He was starting to get some right-sided pain and this is resolved after changing chairs.  He denies any pain down the legs.  He gets relief with using a walker but not really as much using his cane or without the cane.  His pain is worse with weightbearing on the left side and leaning to the left side.  He also reports that he does use the weight machines at the fitness facility at his residence.  He has been using a lumbar flexion extension type AB machine and we did discuss not using that.  Otherwise he does some core strengthening with an exercise ball and is still trying to be quite active.  He denies any focal weakness.  He has had no other changes.  Prior facet joint block also gave him relief in the past.  This historically has given him some relief.  MRI evidence of no stenosis noted.  Review of Systems  Constitutional: Negative for chills, fever, malaise/fatigue and weight loss.  HENT: Negative for hearing loss and sinus pain.   Eyes: Negative for blurred vision, double vision and photophobia.  Respiratory: Negative for cough and shortness of breath.   Cardiovascular: Negative for  chest pain, palpitations and leg swelling.  Gastrointestinal: Negative for abdominal pain, nausea and vomiting.  Genitourinary: Negative for flank pain.  Musculoskeletal: Positive for back pain. Negative for myalgias.  Skin: Negative for itching and rash.  Neurological: Negative for tremors, focal weakness and weakness.  Endo/Heme/Allergies: Negative.   Psychiatric/Behavioral: Negative for depression.  All other systems reviewed and are negative.  Otherwise per HPI.  Assessment & Plan: Visit Diagnoses:  1. Chronic left-sided low back pain without sciatica   2. Myofascial pain syndrome   3. Chronic pain syndrome   4. Chronic, continuous use of opioids     Plan: Findings:  Chronic pain syndrome requiring a small amount of hydrocodone for many years with history of multiple somatic complaints of low back pain and he feels like he has had shortening of his left leg and rotation of his pelvis to a degree although x-ray imaging shows pelvis is fairly aligned he does have scoliosis of the lumbar spine.  He has facet arthropathy.  Pain really is over the top of the iliac crest which is been consistent with him.  I think this is more of a focal muscle tendon junction type pain versus trigger point in that area.  Today we are going to complete an injection across this muscle sheath area and just see how that does with some cortisone medication we will go ahead  and needle the area as well just in case it is a trigger point.  We are also going to schedule him for facet joint block which is helped in the past but he knows if he is doing better to cancel this injection.  We also discussed activity modification with the reduction or disuse of the flexion extension machine that has been doing for his abs and back.  I think this is probably just flaring up his symptoms.  We have also shown him how to do the "dead bug exercise ".    Meds & Orders: No orders of the defined types were placed in this encounter.     Orders Placed This Encounter  Procedures  . Trigger Point Inj    Follow-up: Return for Left L4-5 facet block.   Procedures: Trigger Point Inj Date/Time: 08/08/2018 5:54 AM Performed by: Magnus Sinning, MD Authorized by: Magnus Sinning, MD   Consent Given by:  Patient Site marked: the procedure site was marked   Timeout: prior to procedure the correct patient, procedure, and site was verified   Indications:  Pain Total # of Trigger Points:  1 Location: back   Needle Size:  25 G Approach:  Dorsal and lateral Medications #1:  3 mL lidocaine 1 %; 40 mg triamcinolone acetonide 40 MG/ML Patient tolerance:  Patient tolerated the procedure well with no immediate complications Comments: This was a muscle tendon sheath injection over the top of the iliac crest and almost considered a cluneal nerve block.  Muscle was palpated which was the latissimus dorsi tie into the upper iliac crest and injection was made freely without any issues along that area.    No notes on file   Clinical History: Lumbar spine MRI dated 05/29/2016   L4-5: Anterolisthesis, uncovered disc with small bulge, central protrusion, moderate facet and ligamentum flavum hypertrophy, and large right foraminal and extra foraminal marginal osteophyte. Severe right foraminal narrowing. Mild left foraminal narrowing. Effacement of lateral recesses with contact upon the descending right L5 nerve root. No significant canal stenosis.   L5-S1: Small disc bulge and mild facet/ligamentum flavum hypertrophy. Mild bilateral foraminal narrowing. Right-sided 5 mm T2 hyperintense structure arising from the right facet joint possibly impinging the exiting right L5 nerve root likely a synovial cyst  Mild S-shaped lumbar curvature, grade 1 L4-5 anterolisthesis, and prominent discogenic and facet arthropathy. No acute osseous abnormality.  No high-grade canal stenosis. 3. Multiple levels of foraminal narrowing moderate at left  L2-3, moderate at right L3-4, and severe at right L4-5 levels. 4. Large marginal osteophytes with contact upon exiting left L2 nerve root at the L2-3 level and right L3 nerve root at the L3-4 level.    He reports that he has quit smoking. He has never used smokeless tobacco. No results for input(s): HGBA1C, LABURIC in the last 8760 hours.  Objective:  VS:  HT:    WT:   BMI:     BP:132/66  HR:76bpm  TEMP: ( )  RESP:  Physical Exam Vitals signs and nursing note reviewed.  Constitutional:      General: He is not in acute distress.    Appearance: Normal appearance. He is well-developed.  HENT:     Head: Normocephalic and atraumatic.  Eyes:     Conjunctiva/sclera: Conjunctivae normal.     Pupils: Pupils are equal, round, and reactive to light.  Neck:     Musculoskeletal: Normal range of motion and neck supple. No neck rigidity.  Cardiovascular:  Rate and Rhythm: Normal rate.     Pulses: Normal pulses.     Heart sounds: Normal heart sounds.  Pulmonary:     Effort: Pulmonary effort is normal. No respiratory distress.  Musculoskeletal:     Right lower leg: No edema.     Left lower leg: No edema.     Comments: Patient is very slow to rise from a seated position to standing with a forward flexed lumbar spine.  He does ambulate with a cane slowly.  He has some instability with trying to stand on his own and if he stands to the left he gets pain over the iliac crest.  Palpation of this area is quite tender along the muscle tendon insertion above the iliac crest.  This is pretty lateral compared to the spine.  He does get pain with facet loading as well but is more midline.  He has no pain over the greater trochanters he has no pain with hip rotation has good distal strength.  Skin:    General: Skin is warm and dry.     Findings: No erythema or rash.  Neurological:     General: No focal deficit present.     Mental Status: He is alert and oriented to person, place, and time.      Sensory: No sensory deficit.     Coordination: Coordination normal.     Gait: Gait normal.  Psychiatric:        Mood and Affect: Mood normal.        Behavior: Behavior normal.     Ortho Exam Imaging: No results found.  Past Medical/Family/Surgical/Social History: Medications & Allergies reviewed per EMR, new medications updated. Patient Active Problem List   Diagnosis Date Noted  . 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 Chronic pain syndrome 09/26/2015  . De Quervain's tenosynovitis, right 09/13/2015  . Anemia due to antineoplastic chemotherapy 08/15/2015  . DLBCL (diffuse large B cell lymphoma) ([place]) 08/15/2015  . Skin lesion of face 05/21/2015  . Peripheral neuropathy due to chemotherapy ([place]) 02/09/2015  . Benign prostatic hyperplasia with urinary obstruction 02/09/2015  . Bilateral hearing loss 02/09/2015  . Low back pain 02/09/2015  . Chronic kidney disease (CKD), stage III (moderate) ([place]) 02/09/2015  . Essential (primary) hypertension 02/09/2015  . Gastro-esophageal reflux disease without esophagitis 02/09/2015  . Anxiety, generalized 02/09/2015  . Cannot sleep 02/09/2015  . Combined fat and carbohydrate induced hyperlipemia 02/09/2015   Past Medical History:  Diagnosis Date  . Anemia   . Anemia due to antineoplastic chemotherapy 08/15/2015  . Anxiety   . Arthritis   . Cancer ([place])   . Cataract   . Chronic kidney disease (CKD), stage III (moderate) ([facility]) 02/09/2015   Controlled  . CMML (chronic myelomonocytic leukemia) ([place])   . CMML (chronic myelomonocytic leukemia) ([place])   . Combined fat and carbohydrate induced hyperlipemia 02/09/2015   Controlled  . Depression   . DLBCL (diffuse large B cell lymphoma) ([place]) 08/15/2015  . Essential (primary) hypertension 02/09/2015  . Gastro-esophageal reflux disease without esophagitis 02/09/2015   Controlled  . Neuromuscular disorder ([place])   . Tinnitus    Family History  Problem  Relation Age of Onset  . Anuerysm Mother 36       Deceased  . Colon cancer Father 75       Deceased  . Colon cancer Brother   . Prostate cancer Brother        Deceased  . Alzheimer's disease  Brother   . Heart disease Brother   . Alzheimer's disease Paternal Grandmother   . Early death Maternal Grandmother        Unknown  . Obesity Son   . Obesity Son        Gastric Bypass  . Healthy Son   . Healthy Daughter   . Diabetes Neg Hx    Past Surgical History:  Procedure Laterality Date  . CATARACT EXTRACTION, BILATERAL    . COLONOSCOPY  May 2011  . CYSTOSCOPY    . TOTAL HIP ARTHROPLASTY  2003   Right   . TOTAL HIP ARTHROPLASTY  April, 2011   Left  . TRIGGER FINGER RELEASE  Nov 2013  . TURBINECTOMY  2006  . UPPER GI ENDOSCOPY  Nov 2015   Social History   Occupational History  . Not on file  Tobacco Use  . Smoking status: Former Research scientist (life sciences)  . Smokeless tobacco: Never Used  . Tobacco comment: Quit >50 years ago  Substance and Sexual Activity  . Alcohol use: Yes    Alcohol/week: 1.0 - 4.0 standard drinks    Types: 1 - 4 Shots of liquor per week    Comment: 4 glasses per week  . Drug use: No  . Sexual activity: Not on file

## 2018-08-18 ENCOUNTER — Inpatient Hospital Stay (HOSPITAL_BASED_OUTPATIENT_CLINIC_OR_DEPARTMENT_OTHER): Payer: Medicare Other | Admitting: Hematology & Oncology

## 2018-08-18 ENCOUNTER — Inpatient Hospital Stay: Payer: Medicare Other | Attending: Hematology & Oncology

## 2018-08-18 ENCOUNTER — Other Ambulatory Visit: Payer: Self-pay

## 2018-08-18 ENCOUNTER — Inpatient Hospital Stay: Payer: Medicare Other

## 2018-08-18 ENCOUNTER — Encounter: Payer: Self-pay | Admitting: Hematology & Oncology

## 2018-08-18 VITALS — BP 133/57 | HR 76 | Temp 98.4°F | Resp 18 | Wt 210.0 lb

## 2018-08-18 DIAGNOSIS — C9310 Chronic myelomonocytic leukemia not having achieved remission: Secondary | ICD-10-CM

## 2018-08-18 DIAGNOSIS — Z79899 Other long term (current) drug therapy: Secondary | ICD-10-CM | POA: Diagnosis not present

## 2018-08-18 DIAGNOSIS — Z8572 Personal history of non-Hodgkin lymphomas: Secondary | ICD-10-CM

## 2018-08-18 DIAGNOSIS — Z9221 Personal history of antineoplastic chemotherapy: Secondary | ICD-10-CM | POA: Insufficient documentation

## 2018-08-18 DIAGNOSIS — R5383 Other fatigue: Secondary | ICD-10-CM | POA: Diagnosis not present

## 2018-08-18 DIAGNOSIS — R05 Cough: Secondary | ICD-10-CM | POA: Diagnosis not present

## 2018-08-18 DIAGNOSIS — C8333 Diffuse large B-cell lymphoma, intra-abdominal lymph nodes: Secondary | ICD-10-CM

## 2018-08-18 DIAGNOSIS — D508 Other iron deficiency anemias: Secondary | ICD-10-CM

## 2018-08-18 DIAGNOSIS — T451X5A Adverse effect of antineoplastic and immunosuppressive drugs, initial encounter: Secondary | ICD-10-CM

## 2018-08-18 DIAGNOSIS — D6481 Anemia due to antineoplastic chemotherapy: Secondary | ICD-10-CM

## 2018-08-18 LAB — CMP (CANCER CENTER ONLY)
ALK PHOS: 55 U/L (ref 38–126)
ALT: 14 U/L (ref 0–44)
AST: 16 U/L (ref 15–41)
Albumin: 4.3 g/dL (ref 3.5–5.0)
Anion gap: 9 (ref 5–15)
BUN: 35 mg/dL — ABNORMAL HIGH (ref 8–23)
CO2: 27 mmol/L (ref 22–32)
CREATININE: 1.68 mg/dL — AB (ref 0.61–1.24)
Calcium: 9.5 mg/dL (ref 8.9–10.3)
Chloride: 106 mmol/L (ref 98–111)
GFR, Est AFR Am: 41 mL/min — ABNORMAL LOW (ref >60)
GFR, Estimated: 36 mL/min — ABNORMAL LOW (ref >60)
Glucose, Bld: 128 mg/dL — ABNORMAL HIGH (ref 70–99)
Potassium: 5.3 mmol/L — ABNORMAL HIGH (ref 3.5–5.1)
SODIUM: 142 mmol/L (ref 135–145)
Total Bilirubin: 0.9 mg/dL (ref 0.3–1.2)
Total Protein: 6.6 g/dL (ref 6.5–8.1)

## 2018-08-18 LAB — CBC WITH DIFFERENTIAL (CANCER CENTER ONLY)
Abs Immature Granulocytes: 0.04 10*3/uL (ref 0.00–0.07)
Basophils Absolute: 0 10*3/uL (ref 0.0–0.1)
Basophils Relative: 0 %
Eosinophils Absolute: 0 10*3/uL (ref 0.0–0.5)
Eosinophils Relative: 0 %
HCT: 31.7 % — ABNORMAL LOW (ref 39.0–52.0)
Hemoglobin: 10.3 g/dL — ABNORMAL LOW (ref 13.0–17.0)
Immature Granulocytes: 1 %
Lymphocytes Relative: 43 %
Lymphs Abs: 2.1 10*3/uL (ref 0.7–4.0)
MCH: 36.3 pg — ABNORMAL HIGH (ref 26.0–34.0)
MCHC: 32.5 g/dL (ref 30.0–36.0)
MCV: 111.6 fL — ABNORMAL HIGH (ref 80.0–100.0)
Monocytes Absolute: 1.3 10*3/uL — ABNORMAL HIGH (ref 0.1–1.0)
Monocytes Relative: 25 %
Neutro Abs: 1.6 10*3/uL — ABNORMAL LOW (ref 1.7–7.7)
Neutrophils Relative %: 31 %
Platelet Count: 87 10*3/uL — ABNORMAL LOW (ref 150–400)
RBC: 2.84 MIL/uL — ABNORMAL LOW (ref 4.22–5.81)
RDW: 15.5 % (ref 11.5–15.5)
WBC: 5 10*3/uL (ref 4.0–10.5)
nRBC: 0 % (ref 0.0–0.2)

## 2018-08-18 LAB — RETICULOCYTES
Immature Retic Fract: 17.5 % — ABNORMAL HIGH (ref 2.3–15.9)
RBC.: 2.84 MIL/uL — ABNORMAL LOW (ref 4.22–5.81)
Retic Count, Absolute: 46.9 10*3/uL (ref 19.0–186.0)
Retic Ct Pct: 1.7 % (ref 0.4–3.1)

## 2018-08-18 LAB — SAVE SMEAR(SSMR), FOR PROVIDER SLIDE REVIEW

## 2018-08-18 MED ORDER — DARBEPOETIN ALFA 300 MCG/0.6ML IJ SOSY
300.0000 ug | PREFILLED_SYRINGE | Freq: Once | INTRAMUSCULAR | Status: AC
  Administered 2018-08-18: 300 ug via SUBCUTANEOUS

## 2018-08-18 MED ORDER — DARBEPOETIN ALFA 300 MCG/0.6ML IJ SOSY
PREFILLED_SYRINGE | INTRAMUSCULAR | Status: AC
  Filled 2018-08-18: qty 0.6

## 2018-08-18 NOTE — Progress Notes (Signed)
Hematology and Oncology Follow Up Visit  Jeremiah Walker [id number] 04/08/1928 85 y.o. 08/18/2018   Principle Diagnosis:    History of diffuse large cell non-Hodgkin's lymphoma-treated in [place]  Chronic Myelomonocytic Leukemia (CMMoL) - Normal cytogenetics  Current Therapy:    Aranesp 300 mcg sq q 3 weeks for Hgb < 11     Interim History:  Jeremiah Walker is back for follow-up.  He is doing pretty well.  He is still exercising.  Had a very nice weekend.  He is at the nursing home.  He really enjoys it there.  He has had no issues with bleeding.  He has had no fever.  There has been no cough or shortness of breath.  He has had no rashes.  His iron studies we saw him back in January showed a ferritin of 263 with an iron saturation of 56%.  His last abdominal ultrasound was over a year ago.  I really think we have to repeat an ultrasound to see how his spleen and liver are doing.  Overall, his performance status is ECOG 1.    Medications:  Current Outpatient Medications:  .  allopurinol (ZYLOPRIM) 100 MG tablet, Take 2 tablets (200 mg total) by mouth daily., Disp: 180 tablet, Rfl: 3 .  ALPRAZolam (XANAX) 0.5 MG tablet, TAKE 1 TABLET(0.5 MG) BY MOUTH THREE TIMES DAILY AS NEEDED FOR ANXIETY, Disp: 90 tablet, Rfl: 3 .  amoxicillin (AMOXIL) 500 MG capsule, Take 4 caps (2gm) by mouth once prior to dental procedure, Disp: 20 capsule, Rfl: 0 .  azelastine (ASTELIN) 0.1 % nasal spray, Place 2 sprays into both nostrils 2 (two) times daily. Use in each nostril as directed, Disp: 30 mL, Rfl: 11 .  b complex vitamins tablet, Take 1 tablet by mouth daily., Disp: , Rfl:  .  bethanechol (URECHOLINE) 25 MG tablet, Take 1 tablet (25 mg total) by mouth 3 (three) times daily., Disp: 90 tablet, Rfl: 11 .  Calcium 500 MG tablet, Take 600 mg by mouth daily., Disp: , Rfl:  .  Cholecalciferol (VITAMIN D3) 2000 UNITS capsule, Take 2,000 Units by mouth daily., Disp: , Rfl:  .  gabapentin (NEURONTIN) 300 MG  capsule, TAKE 1 CAPSULES BY MOUTH THREE TIMES DAILY, PLUS 2 CAPSULES EVERY DAY AS NEEDED, Disp: 150 capsule, Rfl: 5 .  HYDROcodone-acetaminophen (NORCO) 7.5-325 MG tablet, Take 1 tablet by mouth every 6 (six) hours as needed for moderate pain., Disp: 100 tablet, Rfl: 0 .  Magnesium Oxide 200 MG TABS, Take by mouth., Disp: , Rfl:  .  Multiple Vitamin (MULTIVITAMIN) tablet, Take 1 tablet by mouth daily., Disp: , Rfl:  .  Omega-3 Fatty Acids (OMEGA-3 FISH OIL PO), Take 2 each by mouth every morning. EPA/DHA 520/350, Disp: , Rfl:  .  polyethylene glycol (MIRALAX / GLYCOLAX) packet, Take 17 g by mouth daily as needed., Disp: , Rfl:  .  predniSONE (DELTASONE) 20 MG tablet, TAKE 1 OR 2 TABLETS BY MOUTH DAILY FOR UP TO 3 DAYS AS NEEDED FOR GOUT FLARES, Disp: 20 tablet, Rfl: 0 .  psyllium (METAMUCIL) 58.6 % powder, Take 1 packet by mouth daily. 1 Teaspoon daily, Disp: , Rfl:  .  UNABLE TO FIND, Med Name: B-6 VITAMIN, Disp: , Rfl:  .  vitamin C (ASCORBIC ACID) 500 MG tablet, Take 500 mg by mouth daily., Disp: , Rfl:  .  zinc gluconate 50 MG tablet, Take 50 mg by mouth daily., Disp: , Rfl:  No current facility-administered medications for this visit.  Facility-Administered Medications Ordered in Other Visits:  .  Darbepoetin Alfa (ARANESP) injection 300 mcg, 300 mcg, Subcutaneous, Once, Lashandra Arauz, Rudell Cobb, MD  Allergies:  Allergies  Allergen Reactions  . Nsaids Other (See Comments) and Tinitus    Bruising   . Tolmetin Other (See Comments)    Bruising  . Ciprofloxacin Other (See Comments)    Other reaction(s): Other (See Comments) Achilles tendon Achilles tendon Achilles tendon Achilles tendon  . Statins Nausea And Vomiting and Other (See Comments)    Other reaction(s): Kidney issues Muscle pain Muscle pain Other reaction(s): Kidney issues Muscle pain Muscle pain Other reaction(s): Kidney issues Muscle pain Muscle pain Other reaction(s): Kidney issues Muscle pain  . Requip [Ropinirole  Hcl] Anxiety  . Ropinirole Anxiety    Mood swing    Past Medical History, Surgical history, Social history, and Family History were reviewed and updated.  Review of Systems: Review of Systems  Constitutional: Positive for malaise/fatigue.  HENT: Negative.   Eyes: Negative.   Respiratory: Positive for cough.   Cardiovascular: Negative.   Gastrointestinal: Negative.   Genitourinary: Negative.   Musculoskeletal: Negative.   Skin: Negative.   Neurological: Negative.   Endo/Heme/Allergies: Negative.   Psychiatric/Behavioral: Negative.      Physical Exam:  weight is 210 lb (95.3 kg). His oral temperature is 98.4 F (36.9 C). His blood pressure is 133/57 (abnormal) and his pulse is 76. His respiration is 18 and oxygen saturation is 99%.   Wt Readings from Last 3 Encounters:  08/18/18 210 lb (95.3 kg)  07/22/18 215 lb (97.5 kg)  07/18/18 222 lb (100.7 kg)     Physical Exam Vitals signs reviewed.  HENT:     Head: Normocephalic and atraumatic.  Eyes:     Pupils: Pupils are equal, round, and reactive to light.  Neck:     Musculoskeletal: Normal range of motion.  Cardiovascular:     Rate and Rhythm: Normal rate and regular rhythm.     Heart sounds: Murmur present.     Comments: In listening to his cardiac exam, he has a 2/6 systolic ejection murmur. Pulmonary:     Effort: Pulmonary effort is normal.     Breath sounds: Normal breath sounds.  Abdominal:     General: Bowel sounds are normal.     Palpations: Abdomen is soft.  Musculoskeletal: Normal range of motion.        General: No tenderness or deformity.  Lymphadenopathy:     Cervical: No cervical adenopathy.  Skin:    General: Skin is warm and dry.     Findings: No erythema or rash.  Neurological:     Mental Status: He is alert and oriented to person, place, and time.  Psychiatric:        Behavior: Behavior normal.        Thought Content: Thought content normal.        Judgment: Judgment normal.     Lab  Results  Component Value Date   WBC 5.0 08/18/2018   HGB 10.3 (L) 08/18/2018   HCT 31.7 (L) 08/18/2018   MCV 111.6 (H) 08/18/2018   PLT 87 (L) 08/18/2018     Chemistry      Component Value Date/Time   NA 142 08/18/2018 1507   NA 139 05/08/2017 1123   NA 141 05/15/2016 1126   K 5.3 (H) 08/18/2018 1507   K 4.0 05/08/2017 1123   K 4.3 05/15/2016 1126   CL 106 08/18/2018 1507   CL 105 05/08/2017  1123   CO2 27 08/18/2018 1507   CO2 30 05/08/2017 1123   CO2 26 05/15/2016 1126   BUN 35 (H) 08/18/2018 1507   BUN 15 05/08/2017 1123   BUN 29.1 (H) 05/15/2016 1126   CREATININE 1.68 (H) 08/18/2018 1507   CREATININE 1.2 05/08/2017 1123   CREATININE 1.7 (H) 05/15/2016 1126   GLU 90 11/15/2014      Component Value Date/Time   CALCIUM 9.5 08/18/2018 1507   CALCIUM 9.1 05/08/2017 1123   CALCIUM 9.7 05/15/2016 1126   ALKPHOS 55 08/18/2018 1507   ALKPHOS 64 05/08/2017 1123   ALKPHOS 69 05/15/2016 1126   AST 16 08/18/2018 1507   AST 23 05/15/2016 1126   ALT 14 08/18/2018 1507   ALT 19 05/08/2017 1123   ALT 22 05/15/2016 1126   BILITOT 0.9 08/18/2018 1507   BILITOT 0.84 05/15/2016 1126         Impression and Plan: Jeremiah Walker is a 85 year old white male. He had a history of diffuse large cell non-Hodgkin's lymphoma. He was treated  with 6 cycles of R-CHOP. He got this treatment up in [place]. He completed this back in April 2014.   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 This could be a residual from his chemotherapy for his lymphoma.  I will go ahead and give him some Aranesp today.  Again I want to make sure that we get his hemoglobin above 11.  I will plan to have him come back in 4 weeks.  I would like to do an abdominal ultrasound on him when we see him back.  He is in agreement to this.   [name], MD 2/24/20204:02 PM

## 2018-08-18 NOTE — Patient Instructions (Signed)

## 2018-08-19 LAB — IRON AND TIBC
Iron: 97 ug/dL (ref 42–163)
Saturation Ratios: 42 % (ref 20–55)
TIBC: 233 ug/dL (ref 202–409)
UIBC: 136 ug/dL (ref 117–376)

## 2018-08-19 LAB — FERRITIN: Ferritin: 262 ng/mL (ref 24–336)

## 2018-08-22 ENCOUNTER — Ambulatory Visit: Payer: Medicare Other | Admitting: Hematology & Oncology

## 2018-08-22 ENCOUNTER — Other Ambulatory Visit: Payer: Medicare Other

## 2018-08-22 ENCOUNTER — Inpatient Hospital Stay: Payer: Medicare Other

## 2018-08-25 ENCOUNTER — Ambulatory Visit (INDEPENDENT_AMBULATORY_CARE_PROVIDER_SITE_OTHER): Payer: Medicare Other | Admitting: Physical Medicine and Rehabilitation

## 2018-08-25 ENCOUNTER — Encounter (INDEPENDENT_AMBULATORY_CARE_PROVIDER_SITE_OTHER): Payer: Self-pay | Admitting: Physical Medicine and Rehabilitation

## 2018-08-25 ENCOUNTER — Ambulatory Visit (INDEPENDENT_AMBULATORY_CARE_PROVIDER_SITE_OTHER): Payer: Self-pay

## 2018-08-25 VITALS — BP 143/63 | HR 76 | Temp 97.7°F

## 2018-08-25 DIAGNOSIS — M47816 Spondylosis without myelopathy or radiculopathy, lumbar region: Secondary | ICD-10-CM | POA: Diagnosis not present

## 2018-08-25 MED ORDER — METHYLPREDNISOLONE ACETATE 80 MG/ML IJ SUSP
80.0000 mg | Freq: Once | INTRAMUSCULAR | Status: AC
  Administered 2018-08-25: 80 mg

## 2018-08-25 NOTE — Progress Notes (Signed)
 .  Numeric Pain Rating Scale and Functional Assessment Average Pain 8   In the last MONTH (on 0-10 scale) has pain interfered with the following?  1. General activity like being  able to carry out your everyday physical activities such as walking, climbing stairs, carrying groceries, or moving a chair?  Rating(6)   +Driver, -BT, -Dye Allergies.  

## 2018-08-26 NOTE — Progress Notes (Signed)
Jeremiah Walker - 85 y.o. male MRN [id number]  Date of birth: [birth year]  Office Visit Note: Visit Date: 08/25/2018 PCP: [name], MD Referred by: [name], MD  Subjective: Chief Complaint  Patient presents with  . Lower Back - Pain   HPI: Jeremiah Walker is a 85 y.o. male who comes in today For planned left L4-5 facet joint block.  Please see our prior evaluation and management note for further details and justification.  ROS Otherwise per HPI.  Assessment & Plan: Visit Diagnoses:  1. Spondylosis without myelopathy or radiculopathy, lumbar region     Plan: No additional findings.   Meds & Orders:  Meds ordered this encounter  Medications  . methylPREDNISolone acetate (DEPO-MEDROL) injection 80 mg    Orders Placed This Encounter  Procedures  . Facet Injection  . XR C-ARM NO REPORT    Follow-up: Return if symptoms worsen or fail to improve.   Procedures: No procedures performed  Lumbar Facet Joint Intra-Articular Injection(s) with Fluoroscopic Guidance  Patient: Jeremiah Walker      Date of Birth: 24-Apr-1928 MRN: [id number] PCP: [name], MD      Visit Date: 08/25/2018   Universal Protocol:    Date/Time: 08/25/2018  Consent Given By: the patient  Position: PRONE   Additional Comments: Vital signs were monitored before and after the procedure. Patient was prepped and draped in the usual sterile fashion. The correct patient, procedure, and site was verified.   Injection Procedure Details:  Procedure Site One Meds Administered:  Meds ordered this encounter  Medications  . methylPREDNISolone acetate (DEPO-MEDROL) injection 80 mg     Laterality: Left  Location/Site:  L4-L5  Needle size: 22 guage  Needle type: Spinal  Needle Placement: Articular  Findings:  -Comments: Excellent flow of contrast producing a partial arthrogram.  Procedure Details: The fluoroscope beam is vertically oriented in AP, and the inferior  recess is visualized beneath the lower pole of the inferior apophyseal process, which represents the target point for needle insertion. When direct visualization is difficult the target point is located at the medial projection of the vertebral pedicle. The region overlying each aforementioned target is locally anesthetized with a 1 to 2 ml. volume of 1% Lidocaine without Epinephrine.   The spinal needle was inserted into each of the above mentioned facet joints using biplanar fluoroscopic guidance. A 0.25 to 0.5 ml. volume of Isovue-250 was injected and a partial facet joint arthrogram was obtained. A single spot film was obtained of the resulting arthrogram.    One to 1.25 ml of the steroid/anesthetic solution was then injected into each of the facet joints noted above.   Additional Comments:  The patient tolerated the procedure well Dressing: 2 x 2 sterile gauze and Band-Aid    Post-procedure details: Patient was observed during the procedure. Post-procedure instructions were reviewed.  Patient left the clinic in stable condition.    Clinical History: Lumbar spine MRI dated 05/29/2016   L4-5: Anterolisthesis, uncovered disc with small bulge, central protrusion, moderate facet and ligamentum flavum hypertrophy, and large right foraminal and extra foraminal marginal osteophyte. Severe right foraminal narrowing. Mild left foraminal narrowing. Effacement of lateral recesses with contact upon the descending right L5 nerve root. No significant canal stenosis.   L5-S1: Small disc bulge and mild facet/ligamentum flavum hypertrophy. Mild bilateral foraminal narrowing. Right-sided 5 mm T2 hyperintense structure arising from the right facet joint possibly impinging the exiting right L5 nerve root likely a synovial  cyst  Mild S-shaped lumbar curvature, grade 1 L4-5 anterolisthesis, and prominent discogenic and facet arthropathy. No acute osseous abnormality.  No high-grade canal  stenosis. 3. Multiple levels of foraminal narrowing moderate at left L2-3, moderate at right L3-4, and severe at right L4-5 levels. 4. Large marginal osteophytes with contact upon exiting left L2 nerve root at the L2-3 level and right L3 nerve root at the L3-4 level.    He reports that he has quit smoking. He has never used smokeless tobacco. No results for input(s): HGBA1C, LABURIC in the last 8760 hours.  Objective:  VS:  HT:    WT:   BMI:     BP:(!) 143/63  HR:76bpm  TEMP:97.7 F (36.5 C)(Oral)  RESP:  Physical Exam  Ortho Exam Imaging: Xr C-arm No Report  Result Date: 08/25/2018 Please see Notes tab for imaging impression.   Past Medical/Family/Surgical/Social History: Medications & Allergies reviewed per EMR, new medications updated. Patient Active Problem List   Diagnosis Date Noted  . 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 Chronic pain syndrome 09/26/2015  . De Quervain's tenosynovitis, right 09/13/2015  . Anemia due to antineoplastic chemotherapy 08/15/2015  . DLBCL (diffuse large B cell lymphoma) ([place]) 08/15/2015  . Skin lesion of face 05/21/2015  . Peripheral neuropathy due to chemotherapy ([place]) 02/09/2015  . Benign prostatic hyperplasia with urinary obstruction 02/09/2015  . Bilateral hearing loss 02/09/2015  . Low back pain 02/09/2015  . Chronic kidney disease (CKD), stage III (moderate) ([place]) 02/09/2015  . Essential (primary) hypertension 02/09/2015  . Gastro-esophageal reflux disease without esophagitis 02/09/2015  . Anxiety, generalized 02/09/2015  . Cannot sleep 02/09/2015  . Combined fat and carbohydrate induced hyperlipemia 02/09/2015   Past Medical History:  Diagnosis Date  . Anemia   . Anemia due to antineoplastic chemotherapy 08/15/2015  . Anxiety   . Arthritis   . Cancer ([place])   . Cataract   . Chronic kidney disease (CKD), stage III (moderate) ([facility]) 02/09/2015   Controlled  . CMML (chronic  myelomonocytic leukemia) ([place])   . CMML (chronic myelomonocytic leukemia) ([place])   . Combined fat and carbohydrate induced hyperlipemia 02/09/2015   Controlled  . Depression   . DLBCL (diffuse large B cell lymphoma) ([place]) 08/15/2015  . Essential (primary) hypertension 02/09/2015  . Gastro-esophageal reflux disease without esophagitis 02/09/2015   Controlled  . Neuromuscular disorder ([place])   . Tinnitus    Family History  Problem Relation Age of Onset  . Anuerysm Mother 35       Deceased  . Colon cancer Father 36       Deceased  . Colon cancer Brother   . Prostate cancer Brother        Deceased  . Alzheimer's disease Brother   . Heart disease Brother   . Alzheimer's disease Paternal Grandmother   . Early death Maternal Grandmother        Unknown  . Obesity Son   . Obesity Son        Gastric Bypass  . Healthy Son   . Healthy Daughter   . Diabetes Neg Hx    Past Surgical History:  Procedure Laterality Date  . CATARACT EXTRACTION, BILATERAL    . COLONOSCOPY  May 2011  . CYSTOSCOPY    . TOTAL HIP ARTHROPLASTY  2003   Right   . TOTAL HIP ARTHROPLASTY  April, 2011   Left  . TRIGGER FINGER RELEASE  Nov 2013  . TURBINECTOMY  2006  .  UPPER GI ENDOSCOPY  Nov 2015   Social History   Occupational History  . Not on file  Tobacco Use  . Smoking status: Former Research scientist (life sciences)  . Smokeless tobacco: Never Used  . Tobacco comment: Quit >50 years ago  Substance and Sexual Activity  . Alcohol use: Yes    Alcohol/week: 1.0 - 4.0 standard drinks    Types: 1 - 4 Shots of liquor per week    Comment: 4 glasses per week  . Drug use: No  . Sexual activity: Not on file

## 2018-08-26 NOTE — Procedures (Signed)
Lumbar Facet Joint Intra-Articular Injection(s) with Fluoroscopic Guidance  Patient: Jeremiah Walker      Date of Birth: [birth year] MRN: [id number] PCP: [name], MD      Visit Date: 08/25/2018   Universal Protocol:    Date/Time: 08/25/2018  Consent Given By: the patient  Position: PRONE   Additional Comments: Vital signs were monitored before and after the procedure. Patient was prepped and draped in the usual sterile fashion. The correct patient, procedure, and site was verified.   Injection Procedure Details:  Procedure Site One Meds Administered:  Meds ordered this encounter  Medications  . methylPREDNISolone acetate (DEPO-MEDROL) injection 80 mg     Laterality: Left  Location/Site:  L4-L5  Needle size: 22 guage  Needle type: Spinal  Needle Placement: Articular  Findings:  -Comments: Excellent flow of contrast producing a partial arthrogram.  Procedure Details: The fluoroscope beam is vertically oriented in AP, and the inferior recess is visualized beneath the lower pole of the inferior apophyseal process, which represents the target point for needle insertion. When direct visualization is difficult the target point is located at the medial projection of the vertebral pedicle. The region overlying each aforementioned target is locally anesthetized with a 1 to 2 ml. volume of 1% Lidocaine without Epinephrine.   The spinal needle was inserted into each of the above mentioned facet joints using biplanar fluoroscopic guidance. A 0.25 to 0.5 ml. volume of Isovue-250 was injected and a partial facet joint arthrogram was obtained. A single spot film was obtained of the resulting arthrogram.    One to 1.25 ml of the steroid/anesthetic solution was then injected into each of the facet joints noted above.   Additional Comments:  The patient tolerated the procedure well Dressing: 2 x 2 sterile gauze and Band-Aid    Post-procedure details: Patient was observed  during the procedure. Post-procedure instructions were reviewed.  Patient left the clinic in stable condition.

## 2018-09-15 ENCOUNTER — Encounter: Payer: Self-pay | Admitting: Hematology & Oncology

## 2018-09-15 ENCOUNTER — Ambulatory Visit (HOSPITAL_BASED_OUTPATIENT_CLINIC_OR_DEPARTMENT_OTHER): Payer: Medicare Other

## 2018-09-15 ENCOUNTER — Other Ambulatory Visit: Payer: Self-pay

## 2018-09-15 ENCOUNTER — Inpatient Hospital Stay: Payer: Medicare Other

## 2018-09-15 ENCOUNTER — Inpatient Hospital Stay: Payer: Medicare Other | Attending: Hematology & Oncology | Admitting: Hematology & Oncology

## 2018-09-15 ENCOUNTER — Encounter (HOSPITAL_BASED_OUTPATIENT_CLINIC_OR_DEPARTMENT_OTHER): Payer: Self-pay

## 2018-09-15 VITALS — BP 140/74 | HR 81 | Temp 98.0°F | Resp 18 | Wt 207.0 lb

## 2018-09-15 DIAGNOSIS — Z9221 Personal history of antineoplastic chemotherapy: Secondary | ICD-10-CM | POA: Diagnosis not present

## 2018-09-15 DIAGNOSIS — C9310 Chronic myelomonocytic leukemia not having achieved remission: Secondary | ICD-10-CM | POA: Diagnosis not present

## 2018-09-15 DIAGNOSIS — Z8572 Personal history of non-Hodgkin lymphomas: Secondary | ICD-10-CM

## 2018-09-15 LAB — SAVE SMEAR(SSMR), FOR PROVIDER SLIDE REVIEW

## 2018-09-15 LAB — CBC WITH DIFFERENTIAL (CANCER CENTER ONLY)
Abs Immature Granulocytes: 0.03 10*3/uL (ref 0.00–0.07)
Basophils Absolute: 0 10*3/uL (ref 0.0–0.1)
Basophils Relative: 1 %
Eosinophils Absolute: 0 10*3/uL (ref 0.0–0.5)
Eosinophils Relative: 0 %
HEMATOCRIT: 35.5 % — AB (ref 39.0–52.0)
Hemoglobin: 11.6 g/dL — ABNORMAL LOW (ref 13.0–17.0)
Immature Granulocytes: 1 %
Lymphocytes Relative: 38 %
Lymphs Abs: 1.9 10*3/uL (ref 0.7–4.0)
MCH: 36.8 pg — ABNORMAL HIGH (ref 26.0–34.0)
MCHC: 32.7 g/dL (ref 30.0–36.0)
MCV: 112.7 fL — ABNORMAL HIGH (ref 80.0–100.0)
Monocytes Absolute: 1.4 10*3/uL — ABNORMAL HIGH (ref 0.1–1.0)
Monocytes Relative: 27 %
Neutro Abs: 1.6 10*3/uL — ABNORMAL LOW (ref 1.7–7.7)
Neutrophils Relative %: 33 %
Platelet Count: 90 10*3/uL — ABNORMAL LOW (ref 150–400)
RBC: 3.15 MIL/uL — ABNORMAL LOW (ref 4.22–5.81)
RDW: 15.2 % (ref 11.5–15.5)
WBC Count: 4.9 10*3/uL (ref 4.0–10.5)
nRBC: 0 % (ref 0.0–0.2)

## 2018-09-15 LAB — RETICULOCYTES
Immature Retic Fract: 13.5 % (ref 2.3–15.9)
RBC.: 3.14 MIL/uL — AB (ref 4.22–5.81)
RETIC CT PCT: 1.1 % (ref 0.4–3.1)
Retic Count, Absolute: 34.2 10*3/uL (ref 19.0–186.0)

## 2018-09-15 LAB — CMP (CANCER CENTER ONLY)
ALBUMIN: 4.5 g/dL (ref 3.5–5.0)
ALK PHOS: 50 U/L (ref 38–126)
ALT: 16 U/L (ref 0–44)
AST: 19 U/L (ref 15–41)
Anion gap: 9 (ref 5–15)
BUN: 24 mg/dL — ABNORMAL HIGH (ref 8–23)
CO2: 29 mmol/L (ref 22–32)
Calcium: 9.3 mg/dL (ref 8.9–10.3)
Chloride: 102 mmol/L (ref 98–111)
Creatinine: 1.43 mg/dL — ABNORMAL HIGH (ref 0.61–1.24)
GFR, Est AFR Am: 50 mL/min — ABNORMAL LOW (ref >60)
GFR, Estimated: 43 mL/min — ABNORMAL LOW (ref >60)
GLUCOSE: 105 mg/dL — AB (ref 70–99)
Potassium: 4.1 mmol/L (ref 3.5–5.1)
SODIUM: 140 mmol/L (ref 135–145)
Total Bilirubin: 1 mg/dL (ref 0.3–1.2)
Total Protein: 7.3 g/dL (ref 6.5–8.1)

## 2018-09-15 MED ORDER — FOLIC ACID 1 MG PO TABS: 2.0000 mg | ORAL_TABLET | Freq: Every day | ORAL | 4 refills | Status: DC

## 2018-09-15 NOTE — Progress Notes (Signed)
Hematology and Oncology Follow Up Visit  Jeremiah Walker [id number] 1927-08-11 85 y.o. 09/15/2018   Principle Diagnosis:    History of diffuse large cell non-Hodgkin's lymphoma-treated in [place]  Chronic Myelomonocytic Leukemia (CMMoL) - Normal cytogenetics  Current Therapy:    Aranesp 300 mcg sq q 3 weeks for Hgb < 11     Interim History:  Jeremiah Walker is back for follow-up.  He is doing pretty well.  He is making it through the coronavirus pandemic pretty well.  He gets food delivered to his room by the nursing home.  He still is exercising.  He does is in a well ventilated area.  He still feels tired.  I am not sure as to why he does feel tired outside the fact that he does have chronic myelomonocytic leukemia.  He is urinating without problems.  He is having no issues with diarrhea or constipation.  He has had some occasional leg swelling but this looks good today.  He has had no fever.  He has had no cough or shortness of breath.  Unfortunately, it sounds like the big family get together that happens up in [place] during the summer is off because of the pandemic.  His iron studies look pretty good last time we saw him.  His ferritin was 262 with an iron saturation of 42%.    Overall, his performance status is ECOG 1.  Medications:  Current Outpatient Medications:  .  allopurinol (ZYLOPRIM) 100 MG tablet, Take 2 tablets (200 mg total) by mouth daily., Disp: 180 tablet, Rfl: 3 .  ALPRAZolam (XANAX) 0.5 MG tablet, TAKE 1 TABLET(0.5 MG) BY MOUTH THREE TIMES DAILY AS NEEDED FOR ANXIETY, Disp: 90 tablet, Rfl: 3 .  amoxicillin (AMOXIL) 500 MG capsule, Take 4 caps (2gm) by mouth once prior to dental procedure, Disp: 20 capsule, Rfl: 0 .  azelastine (ASTELIN) 0.1 % nasal spray, Place 2 sprays into both nostrils 2 (two) times daily. Use in each nostril as directed, Disp: 30 mL, Rfl: 11 .  b complex vitamins tablet, Take 1 tablet by mouth daily., Disp: , Rfl:  .  bethanechol  (URECHOLINE) 25 MG tablet, Take 1 tablet (25 mg total) by mouth 3 (three) times daily., Disp: 90 tablet, Rfl: 11 .  Calcium 500 MG tablet, Take 600 mg by mouth daily., Disp: , Rfl:  .  Cholecalciferol (VITAMIN D3) 2000 UNITS capsule, Take 2,000 Units by mouth daily., Disp: , Rfl:  .  gabapentin (NEURONTIN) 300 MG capsule, TAKE 1 CAPSULES BY MOUTH THREE TIMES DAILY, PLUS 2 CAPSULES EVERY DAY AS NEEDED, Disp: 150 capsule, Rfl: 5 .  HYDROcodone-acetaminophen (NORCO) 7.5-325 MG tablet, Take 1 tablet by mouth every 6 (six) hours as needed for moderate pain., Disp: 100 tablet, Rfl: 0 .  Magnesium Oxide 200 MG TABS, Take by mouth., Disp: , Rfl:  .  Multiple Vitamin (MULTIVITAMIN) tablet, Take 1 tablet by mouth daily., Disp: , Rfl:  .  Omega-3 Fatty Acids (OMEGA-3 FISH OIL PO), Take 2 each by mouth every morning. EPA/DHA 520/350, Disp: , Rfl:  .  polyethylene glycol (MIRALAX / GLYCOLAX) packet, Take 17 g by mouth daily as needed., Disp: , Rfl:  .  predniSONE (DELTASONE) 20 MG tablet, TAKE 1 OR 2 TABLETS BY MOUTH DAILY FOR UP TO 3 DAYS AS NEEDED FOR GOUT FLARES, Disp: 20 tablet, Rfl: 0 .  psyllium (METAMUCIL) 58.6 % powder, Take 1 packet by mouth daily. 1 Teaspoon daily, Disp: , Rfl:  .  UNABLE TO  FIND, Med Name: B-6 VITAMIN, Disp: , Rfl:  .  vitamin C (ASCORBIC ACID) 500 MG tablet, Take 500 mg by mouth daily., Disp: , Rfl:  .  zinc gluconate 50 MG tablet, Take 50 mg by mouth daily., Disp: , Rfl:   Allergies:  Allergies  Allergen Reactions  . Nsaids Other (See Comments) and Tinitus    Bruising   . Tolmetin Other (See Comments)    Bruising  . Ciprofloxacin Other (See Comments)    Other reaction(s): Other (See Comments) Achilles tendon Achilles tendon Achilles tendon Achilles tendon  . Statins Nausea And Vomiting and Other (See Comments)    Other reaction(s): Kidney issues Muscle pain Muscle pain Other reaction(s): Kidney issues Muscle pain Muscle pain Other reaction(s): Kidney issues  Muscle pain Muscle pain Other reaction(s): Kidney issues Muscle pain  . Requip [Ropinirole Hcl] Anxiety  . Ropinirole Anxiety    Mood swing    Past Medical History, Surgical history, Social history, and Family History were reviewed and updated.  Review of Systems: Review of Systems  Constitutional: Positive for malaise/fatigue.  HENT: Negative.   Eyes: Negative.   Respiratory: Positive for cough.   Cardiovascular: Negative.   Gastrointestinal: Negative.   Genitourinary: Negative.   Musculoskeletal: Negative.   Skin: Negative.   Neurological: Negative.   Endo/Heme/Allergies: Negative.   Psychiatric/Behavioral: Negative.      Physical Exam:  weight is 207 lb (93.9 kg). His oral temperature is 98 F (36.7 C). His blood pressure is 140/74 and his pulse is 81. His respiration is 18 and oxygen saturation is 98%.   Wt Readings from Last 3 Encounters:  09/15/18 207 lb (93.9 kg)  08/18/18 210 lb (95.3 kg)  07/22/18 215 lb (97.5 kg)     Physical Exam Vitals signs reviewed.  HENT:     Head: Normocephalic and atraumatic.  Eyes:     Pupils: Pupils are equal, round, and reactive to light.  Neck:     Musculoskeletal: Normal range of motion.  Cardiovascular:     Rate and Rhythm: Normal rate and regular rhythm.     Heart sounds: Murmur present.     Comments: In listening to his cardiac exam, he has a 2/6 systolic ejection murmur. Pulmonary:     Effort: Pulmonary effort is normal.     Breath sounds: Normal breath sounds.  Abdominal:     General: Bowel sounds are normal.     Palpations: Abdomen is soft.  Musculoskeletal: Normal range of motion.        General: No tenderness or deformity.  Lymphadenopathy:     Cervical: No cervical adenopathy.  Skin:    General: Skin is warm and dry.     Findings: No erythema or rash.  Neurological:     Mental Status: He is alert and oriented to person, place, and time.  Psychiatric:        Behavior: Behavior normal.        Thought  Content: Thought content normal.        Judgment: Judgment normal.     Lab Results  Component Value Date   WBC 4.9 09/15/2018   HGB 11.6 (L) 09/15/2018   HCT 35.5 (L) 09/15/2018   MCV 112.7 (H) 09/15/2018   PLT 90 (L) 09/15/2018     Chemistry      Component Value Date/Time   NA 142 08/18/2018 1507   NA 139 05/08/2017 1123   NA 141 05/15/2016 1126   K 5.3 (H) 08/18/2018 1507  K 4.0 05/08/2017 1123   K 4.3 05/15/2016 1126   CL 106 08/18/2018 1507   CL 105 05/08/2017 1123   CO2 27 08/18/2018 1507   CO2 30 05/08/2017 1123   CO2 26 05/15/2016 1126   BUN 35 (H) 08/18/2018 1507   BUN 15 05/08/2017 1123   BUN 29.1 (H) 05/15/2016 1126   CREATININE 1.68 (H) 08/18/2018 1507   CREATININE 1.2 05/08/2017 1123   CREATININE 1.7 (H) 05/15/2016 1126   GLU 90 11/15/2014      Component Value Date/Time   CALCIUM 9.5 08/18/2018 1507   CALCIUM 9.1 05/08/2017 1123   CALCIUM 9.7 05/15/2016 1126   ALKPHOS 55 08/18/2018 1507   ALKPHOS 64 05/08/2017 1123   ALKPHOS 69 05/15/2016 1126   AST 16 08/18/2018 1507   AST 23 05/15/2016 1126   ALT 14 08/18/2018 1507   ALT 19 05/08/2017 1123   ALT 22 05/15/2016 1126   BILITOT 0.9 08/18/2018 1507   BILITOT 0.84 05/15/2016 1126         Impression and Plan: Mr. Krueger is a 85 year old white male. He had a history of diffuse large cell non-Hodgkin's lymphoma. He was treated  with 6 cycles of R-CHOP. He got this treatment up in [place]. He completed this back in April 2014.   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 This could be a residual from his chemotherapy for his lymphoma.  I think it is great that he does not need any Aranesp today.  We will plan to get him back in 5 weeks now.   [name], MD 3/23/20202:37 PM

## 2018-09-16 LAB — LACTATE DEHYDROGENASE: LDH: 216 U/L — ABNORMAL HIGH (ref 98–192)

## 2018-09-16 LAB — IRON AND TIBC
Iron: 130 ug/dL (ref 42–163)
Saturation Ratios: 55 % (ref 20–55)
TIBC: 235 ug/dL (ref 202–409)
UIBC: 105 ug/dL — ABNORMAL LOW (ref 117–376)

## 2018-09-16 LAB — FERRITIN: Ferritin: 277 ng/mL (ref 24–336)

## 2018-09-24 ENCOUNTER — Other Ambulatory Visit: Payer: Self-pay | Admitting: Family Medicine

## 2018-09-24 DIAGNOSIS — G894 Chronic pain syndrome: Secondary | ICD-10-CM

## 2018-09-24 MED ORDER — HYDROCODONE-ACETAMINOPHEN 7.5-325 MG PO TABS: 1.0000 | ORAL_TABLET | Freq: Four times a day (QID) | ORAL | 0 refills | Status: DC | PRN

## 2018-09-24 NOTE — Telephone Encounter (Signed)
Visit is UTD UDS is UTD  Reviewed controlled sub database Ok to refill

## 2018-10-08 ENCOUNTER — Other Ambulatory Visit: Payer: Self-pay | Admitting: Family

## 2018-10-08 DIAGNOSIS — R161 Splenomegaly, not elsewhere classified: Secondary | ICD-10-CM

## 2018-10-08 DIAGNOSIS — C9310 Chronic myelomonocytic leukemia not having achieved remission: Secondary | ICD-10-CM

## 2018-10-08 DIAGNOSIS — R160 Hepatomegaly, not elsewhere classified: Secondary | ICD-10-CM

## 2018-10-13 ENCOUNTER — Telehealth: Payer: Self-pay | Admitting: Hematology & Oncology

## 2018-10-13 NOTE — Telephone Encounter (Signed)
Called and spoke with patient regarding appointment for CT date/time given

## 2018-10-16 ENCOUNTER — Other Ambulatory Visit: Payer: Self-pay

## 2018-10-16 ENCOUNTER — Encounter: Payer: Self-pay | Admitting: *Deleted

## 2018-10-16 ENCOUNTER — Ambulatory Visit (HOSPITAL_BASED_OUTPATIENT_CLINIC_OR_DEPARTMENT_OTHER)
Admission: RE | Admit: 2018-10-16 | Discharge: 2018-10-16 | Disposition: A | Discharge status: Home / Self Care | Payer: Medicare Other | Source: Ambulatory Visit | Attending: Family | Admitting: Family

## 2018-10-16 DIAGNOSIS — N281 Cyst of kidney, acquired: Secondary | ICD-10-CM | POA: Diagnosis not present

## 2018-10-16 DIAGNOSIS — C9310 Chronic myelomonocytic leukemia not having achieved remission: Secondary | ICD-10-CM | POA: Diagnosis not present

## 2018-10-16 DIAGNOSIS — R160 Hepatomegaly, not elsewhere classified: Secondary | ICD-10-CM | POA: Diagnosis not present

## 2018-10-16 DIAGNOSIS — R161 Splenomegaly, not elsewhere classified: Secondary | ICD-10-CM | POA: Diagnosis not present

## 2018-10-17 DIAGNOSIS — N183 Chronic kidney disease, stage 3 (moderate): Secondary | ICD-10-CM | POA: Diagnosis not present

## 2018-10-21 ENCOUNTER — Other Ambulatory Visit: Payer: Self-pay

## 2018-10-21 ENCOUNTER — Inpatient Hospital Stay: Payer: Medicare Other | Attending: Hematology & Oncology | Admitting: Hematology & Oncology

## 2018-10-21 ENCOUNTER — Inpatient Hospital Stay: Payer: Medicare Other

## 2018-10-21 VITALS — BP 126/61 | HR 77 | Temp 97.6°F | Resp 18 | Wt 207.8 lb

## 2018-10-21 DIAGNOSIS — C9310 Chronic myelomonocytic leukemia not having achieved remission: Secondary | ICD-10-CM

## 2018-10-21 DIAGNOSIS — D630 Anemia in neoplastic disease: Secondary | ICD-10-CM | POA: Diagnosis not present

## 2018-10-21 DIAGNOSIS — D469 Myelodysplastic syndrome, unspecified: Secondary | ICD-10-CM | POA: Insufficient documentation

## 2018-10-21 DIAGNOSIS — Z8572 Personal history of non-Hodgkin lymphomas: Secondary | ICD-10-CM

## 2018-10-21 DIAGNOSIS — T451X5A Adverse effect of antineoplastic and immunosuppressive drugs, initial encounter: Secondary | ICD-10-CM

## 2018-10-21 DIAGNOSIS — D6481 Anemia due to antineoplastic chemotherapy: Secondary | ICD-10-CM

## 2018-10-21 DIAGNOSIS — C8333 Diffuse large B-cell lymphoma, intra-abdominal lymph nodes: Secondary | ICD-10-CM

## 2018-10-21 DIAGNOSIS — Z9221 Personal history of antineoplastic chemotherapy: Secondary | ICD-10-CM

## 2018-10-21 LAB — CMP (CANCER CENTER ONLY)
ALT: 12 U/L (ref 0–44)
AST: 16 U/L (ref 15–41)
Albumin: 4 g/dL (ref 3.5–5.0)
Alkaline Phosphatase: 48 U/L (ref 38–126)
Anion gap: 7 (ref 5–15)
BUN: 25 mg/dL — ABNORMAL HIGH (ref 8–23)
CO2: 28 mmol/L (ref 22–32)
Calcium: 9.5 mg/dL (ref 8.9–10.3)
Chloride: 104 mmol/L (ref 98–111)
Creatinine: 1.46 mg/dL — ABNORMAL HIGH (ref 0.61–1.24)
GFR, Est AFR Am: 49 mL/min — ABNORMAL LOW (ref >60)
GFR, Estimated: 42 mL/min — ABNORMAL LOW (ref >60)
Glucose, Bld: 99 mg/dL (ref 70–99)
Potassium: 4.4 mmol/L (ref 3.5–5.1)
Sodium: 139 mmol/L (ref 135–145)
Total Bilirubin: 0.8 mg/dL (ref 0.3–1.2)
Total Protein: 7 g/dL (ref 6.5–8.1)

## 2018-10-21 LAB — CBC WITH DIFFERENTIAL (CANCER CENTER ONLY)
Abs Immature Granulocytes: 0.02 10*3/uL (ref 0.00–0.07)
Basophils Absolute: 0 10*3/uL (ref 0.0–0.1)
Basophils Relative: 1 %
Eosinophils Absolute: 0 10*3/uL (ref 0.0–0.5)
Eosinophils Relative: 1 %
HCT: 30.1 % — ABNORMAL LOW (ref 39.0–52.0)
Hemoglobin: 10.1 g/dL — ABNORMAL LOW (ref 13.0–17.0)
Immature Granulocytes: 0 %
Lymphocytes Relative: 42 %
Lymphs Abs: 2.1 10*3/uL (ref 0.7–4.0)
MCH: 37.8 pg — ABNORMAL HIGH (ref 26.0–34.0)
MCHC: 33.6 g/dL (ref 30.0–36.0)
MCV: 112.7 fL — ABNORMAL HIGH (ref 80.0–100.0)
Monocytes Absolute: 1.7 10*3/uL — ABNORMAL HIGH (ref 0.1–1.0)
Monocytes Relative: 35 %
Neutro Abs: 1.1 10*3/uL — ABNORMAL LOW (ref 1.7–7.7)
Neutrophils Relative %: 21 %
Platelet Count: 93 10*3/uL — ABNORMAL LOW (ref 150–400)
RBC: 2.67 MIL/uL — ABNORMAL LOW (ref 4.22–5.81)
RDW: 14.6 % (ref 11.5–15.5)
WBC Count: 5 10*3/uL (ref 4.0–10.5)
nRBC: 0 % (ref 0.0–0.2)

## 2018-10-21 LAB — SAVE SMEAR(SSMR), FOR PROVIDER SLIDE REVIEW

## 2018-10-21 LAB — RETICULOCYTES
Immature Retic Fract: 13.6 % (ref 2.3–15.9)
RBC.: 2.68 MIL/uL — ABNORMAL LOW (ref 4.22–5.81)
Retic Count, Absolute: 44 10*3/uL (ref 19.0–186.0)
Retic Ct Pct: 1.6 % (ref 0.4–3.1)

## 2018-10-21 MED ORDER — DARBEPOETIN ALFA 300 MCG/0.6ML IJ SOSY
300.0000 ug | PREFILLED_SYRINGE | Freq: Once | INTRAMUSCULAR | Status: AC
  Administered 2018-10-21: 300 ug via SUBCUTANEOUS

## 2018-10-21 MED ORDER — DARBEPOETIN ALFA 300 MCG/0.6ML IJ SOSY
PREFILLED_SYRINGE | INTRAMUSCULAR | Status: AC
  Filled 2018-10-21: qty 0.6

## 2018-10-21 NOTE — Patient Instructions (Signed)

## 2018-10-21 NOTE — Progress Notes (Signed)
Hematology and Oncology Follow Up Visit  Jeremiah Walker [id number] 11-12-1927 85 y.o. 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 is back for follow-up.  He is feeling quite tired.  He said he was doing great until Sunday.  I suspect that his hemoglobin started dropping.  His last iron studies back in late March showed a ferritin of 277 with an iron saturation of 55%.  He has had a good appetite.  He is trying to protect himself from the coronavirus.  He is in the assisted living center.  He is reading a lot.  His birthday is coming up in June.  Hopefully, he will be able to celebrate this with his family.  He is had no nausea or vomiting.  He has had no cough.  He has had no fever.     He had a's ultrasound of the abdomen on 10/16/2018.  Thankfully, the spleen did not look big.  He may have had a fatty liver.  There is a polyp along the gallbladder wall measuring 1 x 0.4 cm.  This probably will have to be watched down the road in a couple months or so.  Overall, his performance status is ECOG 1.  Medications:  Current Outpatient Medications:  .  allopurinol (ZYLOPRIM) 100 MG tablet, Take 2 tablets (200 mg total) by mouth daily., Disp: 180 tablet, Rfl: 3 .  ALPRAZolam (XANAX) 0.5 MG tablet, TAKE 1 TABLET(0.5 MG) BY MOUTH THREE TIMES DAILY AS NEEDED FOR ANXIETY, Disp: 90 tablet, Rfl: 3 .  amoxicillin (AMOXIL) 500 MG capsule, Take 4 caps (2gm) by mouth once prior to dental procedure, Disp: 20 capsule, Rfl: 0 .  azelastine (ASTELIN) 0.1 % nasal spray, Place 2 sprays into both nostrils 2 (two) times daily. Use in each nostril as directed, Disp: 30 mL, Rfl: 11 .  b complex vitamins tablet, Take 1 tablet by mouth daily., Disp: , Rfl:  .  bethanechol  (URECHOLINE) 25 MG tablet, Take 1 tablet (25 mg total) by mouth 3 (three) times daily., Disp: 90 tablet, Rfl: 11 .  Calcium 500 MG tablet, Take 600 mg by mouth daily., Disp: , Rfl:  .  Cholecalciferol (VITAMIN D3) 2000 UNITS capsule, Take 2,000 Units by mouth daily., Disp: , Rfl:  .  folic acid (FOLVITE) 1 MG tablet, Take 2 tablets (2 mg total) by mouth daily., Disp: 60 tablet, Rfl: 4 .  gabapentin (NEURONTIN) 300 MG capsule, TAKE 1 CAPSULES BY MOUTH THREE TIMES DAILY, PLUS 2 CAPSULES EVERY DAY AS NEEDED, Disp: 150 capsule, Rfl: 5 .  HYDROcodone-acetaminophen (NORCO) 7.5-325 MG tablet, Take 1 tablet by mouth every 6 (six) hours as needed for moderate pain., Disp: 100 tablet, Rfl: 0 .  Magnesium Oxide 200 MG TABS, Take by mouth., Disp: , Rfl:  .  Multiple Vitamin (MULTIVITAMIN) tablet, Take 1 tablet by mouth daily., Disp: , Rfl:  .  Omega-3 Fatty Acids (OMEGA-3 FISH OIL PO), Take 2 each by mouth every morning. EPA/DHA 520/350, Disp: , Rfl:  .  polyethylene glycol (MIRALAX / GLYCOLAX) packet, Take 17 g by mouth daily as needed., Disp: , Rfl:  .  predniSONE (DELTASONE) 20 MG tablet, TAKE 1 OR 2 TABLETS BY MOUTH DAILY FOR UP TO 3 DAYS AS NEEDED  FOR GOUT FLARES, Disp: 20 tablet, Rfl: 0 .  psyllium (METAMUCIL) 58.6 % powder, Take 1 packet by mouth daily. 1 Teaspoon daily, Disp: , Rfl:  .  UNABLE TO FIND, Med Name: B-6 VITAMIN, Disp: , Rfl:  .  vitamin C (ASCORBIC ACID) 500 MG tablet, Take 500 mg by mouth daily., Disp: , Rfl:  .  zinc gluconate 50 MG tablet, Take 50 mg by mouth daily., Disp: , Rfl:  No current facility-administered medications for this visit.   Facility-Administered Medications Ordered in Other Visits:  .  Darbepoetin Alfa (ARANESP) injection 300 mcg, 300 mcg, Subcutaneous, Once, Anabella Capshaw, Rudell Cobb, MD  Allergies:  Allergies  Allergen Reactions  . Nsaids Other (See Comments) and Tinitus    Bruising   . Tolmetin Other (See Comments)    Bruising  . Ciprofloxacin Other (See Comments)     Other reaction(s): Other (See Comments) Achilles tendon Achilles tendon Achilles tendon Achilles tendon  . Statins Nausea And Vomiting and Other (See Comments)    Other reaction(s): Kidney issues Muscle pain Muscle pain Other reaction(s): Kidney issues Muscle pain Muscle pain Other reaction(s): Kidney issues Muscle pain Muscle pain Other reaction(s): Kidney issues Muscle pain  . Requip [Ropinirole Hcl] Anxiety  . Ropinirole Anxiety    Mood swing    Past Medical History, Surgical history, Social history, and Family History were reviewed and updated.  Review of Systems: Review of Systems  Constitutional: Positive for malaise/fatigue.  HENT: Negative.   Eyes: Negative.   Respiratory: Positive for cough.   Cardiovascular: Negative.   Gastrointestinal: Negative.   Genitourinary: Negative.   Musculoskeletal: Negative.   Skin: Negative.   Neurological: Negative.   Endo/Heme/Allergies: Negative.   Psychiatric/Behavioral: Negative.      Physical Exam:  weight is 207 lb 12 oz (94.2 kg). His oral temperature is 97.6 F (36.4 C). His blood pressure is 126/61 and his pulse is 77. His respiration is 18 and oxygen saturation is 98%.   Wt Readings from Last 3 Encounters:  10/21/18 207 lb 12 oz (94.2 kg)  09/15/18 207 lb (93.9 kg)  08/18/18 210 lb (95.3 kg)     Physical Exam Vitals signs reviewed.  HENT:     Head: Normocephalic and atraumatic.  Eyes:     Pupils: Pupils are equal, round, and reactive to light.  Neck:     Musculoskeletal: Normal range of motion.  Cardiovascular:     Rate and Rhythm: Normal rate and regular rhythm.     Heart sounds: Murmur present.     Comments: In listening to his cardiac exam, he has a 2/6 systolic ejection murmur. Pulmonary:     Effort: Pulmonary effort is normal.     Breath sounds: Normal breath sounds.  Abdominal:     General: Bowel sounds are normal.     Palpations: Abdomen is soft.  Musculoskeletal: Normal range of motion.         General: No tenderness or deformity.  Lymphadenopathy:     Cervical: No cervical adenopathy.  Skin:    General: Skin is warm and dry.     Findings: No erythema or rash.  Neurological:     Mental Status: He is alert and oriented to person, place, and time.  Psychiatric:        Behavior: Behavior normal.        Thought Content: Thought content normal.        Judgment: Judgment normal.     Lab Results  Component Value Date  WBC 5.0 10/21/2018   HGB 10.1 (L) 10/21/2018   HCT 30.1 (L) 10/21/2018   MCV 112.7 (H) 10/21/2018   PLT 93 (L) 10/21/2018     Chemistry      Component Value Date/Time   NA 140 09/15/2018 1407   NA 139 05/08/2017 1123   NA 141 05/15/2016 1126   K 4.1 09/15/2018 1407   K 4.0 05/08/2017 1123   K 4.3 05/15/2016 1126   CL 102 09/15/2018 1407   CL 105 05/08/2017 1123   CO2 29 09/15/2018 1407   CO2 30 05/08/2017 1123   CO2 26 05/15/2016 1126   BUN 24 (H) 09/15/2018 1407   BUN 15 05/08/2017 1123   BUN 29.1 (H) 05/15/2016 1126   CREATININE 1.43 (H) 09/15/2018 1407   CREATININE 1.2 05/08/2017 1123   CREATININE 1.7 (H) 05/15/2016 1126   GLU 90 11/15/2014      Component Value Date/Time   CALCIUM 9.3 09/15/2018 1407   CALCIUM 9.1 05/08/2017 1123   CALCIUM 9.7 05/15/2016 1126   ALKPHOS 50 09/15/2018 1407   ALKPHOS 64 05/08/2017 1123   ALKPHOS 69 05/15/2016 1126   AST 19 09/15/2018 1407   AST 23 05/15/2016 1126   ALT 16 09/15/2018 1407   ALT 19 05/08/2017 1123   ALT 22 05/15/2016 1126   BILITOT 1.0 09/15/2018 1407   BILITOT 0.84 05/15/2016 1126         Impression and Plan: Mr. Morais is a 85 year old white male. He had a history of diffuse large cell non-Hodgkin's lymphoma. He was treated  with 6 cycles of R-CHOP. He got this treatment up in [place]. He completed this back in April 2014.   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 This could be a residual from his chemotherapy for his lymphoma.  I will go ahead and give him Aranesp  today.  I know that this works with him.  We will plan to get him back in about a month or so and see how he is doing.  Think it is great that he does not need any Aranesp today.  We will plan to get him back in 5 weeks now.   [name], MD 4/28/202012:12 PM

## 2018-10-22 LAB — IRON AND TIBC
Iron: 104 ug/dL (ref 42–163)
Saturation Ratios: 47 % (ref 20–55)
TIBC: 219 ug/dL (ref 202–409)
UIBC: 115 ug/dL — ABNORMAL LOW (ref 117–376)

## 2018-10-22 LAB — FERRITIN: Ferritin: 263 ng/mL (ref 24–336)

## 2018-10-28 DIAGNOSIS — M1990 Unspecified osteoarthritis, unspecified site: Secondary | ICD-10-CM | POA: Diagnosis not present

## 2018-10-28 DIAGNOSIS — N2581 Secondary hyperparathyroidism of renal origin: Secondary | ICD-10-CM | POA: Diagnosis not present

## 2018-10-28 DIAGNOSIS — E785 Hyperlipidemia, unspecified: Secondary | ICD-10-CM | POA: Diagnosis not present

## 2018-10-28 DIAGNOSIS — N183 Chronic kidney disease, stage 3 (moderate): Secondary | ICD-10-CM | POA: Diagnosis not present

## 2018-10-28 DIAGNOSIS — F329 Major depressive disorder, single episode, unspecified: Secondary | ICD-10-CM | POA: Diagnosis not present

## 2018-10-28 DIAGNOSIS — C8330 Diffuse large B-cell lymphoma, unspecified site: Secondary | ICD-10-CM | POA: Diagnosis not present

## 2018-10-28 DIAGNOSIS — C9310 Chronic myelomonocytic leukemia not having achieved remission: Secondary | ICD-10-CM | POA: Diagnosis not present

## 2018-10-28 DIAGNOSIS — I129 Hypertensive chronic kidney disease with stage 1 through stage 4 chronic kidney disease, or unspecified chronic kidney disease: Secondary | ICD-10-CM | POA: Diagnosis not present

## 2018-10-28 DIAGNOSIS — G629 Polyneuropathy, unspecified: Secondary | ICD-10-CM | POA: Diagnosis not present

## 2018-10-28 DIAGNOSIS — D696 Thrombocytopenia, unspecified: Secondary | ICD-10-CM | POA: Diagnosis not present

## 2018-10-28 DIAGNOSIS — K219 Gastro-esophageal reflux disease without esophagitis: Secondary | ICD-10-CM | POA: Diagnosis not present

## 2018-10-28 DIAGNOSIS — D631 Anemia in chronic kidney disease: Secondary | ICD-10-CM | POA: Diagnosis not present

## 2018-10-29 ENCOUNTER — Encounter: Payer: Self-pay | Admitting: Family Medicine

## 2018-10-29 DIAGNOSIS — G2581 Restless legs syndrome: Secondary | ICD-10-CM

## 2018-10-29 DIAGNOSIS — G894 Chronic pain syndrome: Secondary | ICD-10-CM

## 2018-10-29 DIAGNOSIS — G4700 Insomnia, unspecified: Secondary | ICD-10-CM

## 2018-10-29 MED ORDER — HYDROCODONE-ACETAMINOPHEN 7.5-325 MG PO TABS: 1.0000 | ORAL_TABLET | Freq: Four times a day (QID) | ORAL | 0 refills | Status: DC | PRN

## 2018-10-29 MED ORDER — ALPRAZOLAM 0.5 MG PO TABS: ORAL_TABLET | ORAL | 3 refills | Status: DC

## 2018-10-30 ENCOUNTER — Encounter: Payer: Self-pay | Admitting: Family Medicine

## 2018-11-25 ENCOUNTER — Inpatient Hospital Stay: Payer: Medicare Other

## 2018-11-25 ENCOUNTER — Inpatient Hospital Stay: Payer: Medicare Other | Attending: Hematology & Oncology | Admitting: Hematology & Oncology

## 2018-11-25 ENCOUNTER — Other Ambulatory Visit: Payer: Self-pay

## 2018-11-25 ENCOUNTER — Encounter: Payer: Self-pay | Admitting: Hematology & Oncology

## 2018-11-25 VITALS — BP 145/70 | HR 87 | Temp 98.0°F | Resp 17 | Ht 69.5 in | Wt 202.2 lb

## 2018-11-25 DIAGNOSIS — R05 Cough: Secondary | ICD-10-CM | POA: Diagnosis not present

## 2018-11-25 DIAGNOSIS — R5383 Other fatigue: Secondary | ICD-10-CM | POA: Diagnosis not present

## 2018-11-25 DIAGNOSIS — Z8572 Personal history of non-Hodgkin lymphomas: Secondary | ICD-10-CM | POA: Diagnosis not present

## 2018-11-25 DIAGNOSIS — C8333 Diffuse large B-cell lymphoma, intra-abdominal lymph nodes: Secondary | ICD-10-CM

## 2018-11-25 DIAGNOSIS — Z79899 Other long term (current) drug therapy: Secondary | ICD-10-CM | POA: Insufficient documentation

## 2018-11-25 DIAGNOSIS — C9310 Chronic myelomonocytic leukemia not having achieved remission: Secondary | ICD-10-CM

## 2018-11-25 LAB — CMP (CANCER CENTER ONLY)
ALT: 18 U/L (ref 0–44)
AST: 16 U/L (ref 15–41)
Albumin: 3.9 g/dL (ref 3.5–5.0)
Alkaline Phosphatase: 50 U/L (ref 38–126)
Anion gap: 9 (ref 5–15)
BUN: 23 mg/dL (ref 8–23)
CO2: 27 mmol/L (ref 22–32)
Calcium: 8.7 mg/dL — ABNORMAL LOW (ref 8.9–10.3)
Chloride: 104 mmol/L (ref 98–111)
Creatinine: 1.42 mg/dL — ABNORMAL HIGH (ref 0.61–1.24)
GFR, Est AFR Am: 51 mL/min — ABNORMAL LOW (ref >60)
GFR, Estimated: 44 mL/min — ABNORMAL LOW (ref >60)
Glucose, Bld: 171 mg/dL — ABNORMAL HIGH (ref 70–99)
Potassium: 4.1 mmol/L (ref 3.5–5.1)
Sodium: 140 mmol/L (ref 135–145)
Total Bilirubin: 0.8 mg/dL (ref 0.3–1.2)
Total Protein: 7.1 g/dL (ref 6.5–8.1)

## 2018-11-25 LAB — RETICULOCYTES
Immature Retic Fract: 17.9 % — ABNORMAL HIGH (ref 2.3–15.9)
RBC.: 2.97 MIL/uL — ABNORMAL LOW (ref 4.22–5.81)
Retic Count, Absolute: 27.3 10*3/uL (ref 19.0–186.0)
Retic Ct Pct: 0.9 % (ref 0.4–3.1)

## 2018-11-25 LAB — CBC WITH DIFFERENTIAL (CANCER CENTER ONLY)
Abs Immature Granulocytes: 0.38 10*3/uL — ABNORMAL HIGH (ref 0.00–0.07)
Basophils Absolute: 0 10*3/uL (ref 0.0–0.1)
Basophils Relative: 0 %
Eosinophils Absolute: 0 10*3/uL (ref 0.0–0.5)
Eosinophils Relative: 0 %
HCT: 33.7 % — ABNORMAL LOW (ref 39.0–52.0)
Hemoglobin: 11.1 g/dL — ABNORMAL LOW (ref 13.0–17.0)
Immature Granulocytes: 5 %
Lymphocytes Relative: 13 %
Lymphs Abs: 1 10*3/uL (ref 0.7–4.0)
MCH: 37.2 pg — ABNORMAL HIGH (ref 26.0–34.0)
MCHC: 32.9 g/dL (ref 30.0–36.0)
MCV: 113.1 fL — ABNORMAL HIGH (ref 80.0–100.0)
Monocytes Absolute: 1.3 10*3/uL — ABNORMAL HIGH (ref 0.1–1.0)
Monocytes Relative: 17 %
Neutro Abs: 4.8 10*3/uL (ref 1.7–7.7)
Neutrophils Relative %: 65 %
Platelet Count: 125 10*3/uL — ABNORMAL LOW (ref 150–400)
RBC: 2.98 MIL/uL — ABNORMAL LOW (ref 4.22–5.81)
RDW: 14.2 % (ref 11.5–15.5)
WBC Count: 7.4 10*3/uL (ref 4.0–10.5)
nRBC: 0 % (ref 0.0–0.2)

## 2018-11-25 LAB — SAVE SMEAR(SSMR), FOR PROVIDER SLIDE REVIEW
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
## 2018-11-26 LAB — IRON AND TIBC
Iron: 96 ug/dL (ref 42–163)
Saturation Ratios: 41 % (ref 20–55)
TIBC: 236 ug/dL (ref 202–409)
UIBC: 141 ug/dL (ref 117–376)

## 2018-11-26 LAB — FERRITIN: Ferritin: 301 ng/mL (ref 24–336)

## 2018-12-06 ENCOUNTER — Other Ambulatory Visit: Payer: Self-pay | Admitting: Family Medicine

## 2018-12-08 MED ORDER — PREDNISONE 20 MG PO TABS: ORAL_TABLET | ORAL | 0 refills | Status: DC

## 2018-12-09 ENCOUNTER — Other Ambulatory Visit: Payer: Self-pay | Admitting: Family Medicine

## 2018-12-10 ENCOUNTER — Other Ambulatory Visit: Payer: Self-pay

## 2018-12-10 ENCOUNTER — Encounter: Payer: Self-pay | Admitting: Family Medicine

## 2018-12-10 ENCOUNTER — Ambulatory Visit (INDEPENDENT_AMBULATORY_CARE_PROVIDER_SITE_OTHER): Payer: Medicare Other | Admitting: Family Medicine

## 2018-12-10 VITALS — BP 122/60 | HR 96 | Temp 98.2°F | Resp 16 | Ht 69.5 in | Wt 210.0 lb

## 2018-12-10 DIAGNOSIS — G8929 Other chronic pain: Secondary | ICD-10-CM

## 2018-12-10 DIAGNOSIS — L989 Disorder of the skin and subcutaneous tissue, unspecified: Secondary | ICD-10-CM | POA: Diagnosis not present

## 2018-12-10 DIAGNOSIS — L821 Other seborrheic keratosis: Secondary | ICD-10-CM | POA: Diagnosis not present

## 2018-12-10 DIAGNOSIS — M545 Low back pain: Secondary | ICD-10-CM | POA: Diagnosis not present

## 2018-12-10 DIAGNOSIS — G894 Chronic pain syndrome: Secondary | ICD-10-CM | POA: Diagnosis not present

## 2018-12-10 MED ORDER — PREDNISONE 20 MG PO TABS: ORAL_TABLET | ORAL | 0 refills | Status: DC

## 2018-12-10 NOTE — Patient Instructions (Signed)
Good to see you today!  

## 2018-12-10 NOTE — Progress Notes (Signed)
[place] at [facility] [address], [place], [place] [address] [phone]  Date:  12/10/2018   Name:  Jeremiah Walker   DOB:  01/07/1928   MRN:  [id number]  PCP:  [name], MD    Chief Complaint: Chronic Pain Syndrome (my chart message) and Fatigue   History of Present Illness:  Jeremiah Walker is a 85 y.o. very pleasant male patient who presents with the following:  Seen by myself in the office back in December In person visit today due to concern of a sore on his back that started to bleed this past Monday (today is Wednesday).  He does see derm on a regular basis, they had not called attention to this particular lesion, he thinks he just maybe scratched it by accident  [place] has history of non-Hodgkin's lymphoma treated in 2014, and now CML.  He is treated by Dr. Marin Olp  At recent visit with hematology, he was noted to have episodic fatigue Caison also has history of chronic pain due to his lower back and peripheral neuropathy, hypertension, chronic renal disease He notes that his back pain is under good control- however he has noted some muscle spasm and has started using prednisone for this on occasion and it does help  This is ok, but I cautioned him against using prednisone too often   He is on gabapentin and also hydrocodone, alprazolam for anxiety  Can do annual UDS today for pain management 11/28/2018  1   10/29/2018  Alprazolam 0.5 MG Tablet  90.00 30 Je Cop   [id number]   [facility] ([id number])   1  3.00 LME  Comm Ins   [place]  10/29/2018  1   10/29/2018  Hydrocodone-Acetamin 7.5-325  100.00 25 Je Cop   [id number]   [facility] ([id number])   0  30.00 MME  Comm Ins   [place]  10/29/2018  1   10/29/2018  Alprazolam 0.5 MG Tablet  90.00 30 Je Cop   [id number]   [facility] ([id number])   0  3.00 LME  Comm Ins   [place]  09/24/2018  1   09/24/2018  Hydrocodone-Acetamin 7.5-325  100.00 25 Je Cop   [id number]   [facility] ([id number])   0  30.00 MME  Comm Ins   [place]  09/03/2018  1    06/03/2018  Alprazolam 0.5 MG Tablet  90.00 30 Je Cop   [id number]   [facility] ([id number])   3  3.00 LME  Comm Ins   [place]  08/04/2018  1   06/03/2018  Alprazolam 0.5 MG Tablet  90.00 30 Je Cop   [id number]   [facility] ([id number])   2  3.00 LME  Comm Ins   [place]  08/04/2018  1   08/04/2018  Hydrocodone-Acetamin 7.5-325  100.00 25 Je Cop   [id number]   [facility] ([id number])   0  30.00 MME  Comm Ins   [place]  07/05/2018  1   06/03/2018  Alprazolam 0.5 MG Tablet  90.00 30 Je Cop   [id number]   [facility] ([id number])   1  3.00 LME  Comm Ins   [place]  07/05/2018  1   07/03/2018  Hydrocodone-Acetamin 7.5-325  100.00 25 Je Cop   [id number]   [facility] ([id number])   0  30.00 MME       Patient Active Problem List   Diagnosis Date Noted  . 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  Chronic pain syndrome 09/26/2015  . De Quervain's tenosynovitis, right 09/13/2015  . Anemia due to antineoplastic chemotherapy 08/15/2015  . DLBCL (diffuse large B cell lymphoma) ([place]) 08/15/2015  . Skin lesion of face 05/21/2015  . Peripheral neuropathy due to chemotherapy ([place]) 02/09/2015  . Benign prostatic hyperplasia with urinary obstruction 02/09/2015  . Bilateral hearing loss 02/09/2015  . Low back pain 02/09/2015  . Chronic kidney disease (CKD), stage III (moderate) ([place]) 02/09/2015  . Essential (primary) hypertension 02/09/2015  . Gastro-esophageal reflux disease without esophagitis 02/09/2015  . Anxiety, generalized 02/09/2015  . Cannot sleep 02/09/2015  . Combined fat and carbohydrate induced hyperlipemia 02/09/2015    Past Medical History:  Diagnosis Date  . Anemia   . Anemia due to antineoplastic chemotherapy 08/15/2015  . Anxiety   . Arthritis   . Cancer ([place])   . Cataract   . Chronic kidney disease (CKD), stage III (moderate) ([facility]) 02/09/2015   Controlled  . CMML (chronic myelomonocytic leukemia) ([place])   . CMML (chronic myelomonocytic leukemia) ([place])   . Combined fat and carbohydrate induced hyperlipemia 02/09/2015   Controlled  . Depression    . DLBCL (diffuse large B cell lymphoma) ([place]) 08/15/2015  . Essential (primary) hypertension 02/09/2015  . Gastro-esophageal reflux disease without esophagitis 02/09/2015   Controlled  . Neuromuscular disorder ([place])   . Tinnitus     Past Surgical History:  Procedure Laterality Date  . CATARACT EXTRACTION, BILATERAL    . COLONOSCOPY  May 2011  . CYSTOSCOPY    . TOTAL HIP ARTHROPLASTY  2003   Right   . TOTAL HIP ARTHROPLASTY  April, 2011   Left  . TRIGGER FINGER RELEASE  Nov 2013  . TURBINECTOMY  2006  . UPPER GI ENDOSCOPY  Nov 2015    Social History   Tobacco Use  . Smoking status: Former Research scientist (life sciences)  . Smokeless tobacco: Never Used  . Tobacco comment: Quit >50 years ago  Substance Use Topics  . Alcohol use: Yes    Alcohol/week: 1.0 - 4.0 standard drinks    Types: 1 - 4 Shots of liquor per week    Comment: 4 glasses per week  . Drug use: No    Family History  Problem Relation Age of Onset  . Anuerysm Mother 42       Deceased  . Colon cancer Father 39       Deceased  . Colon cancer Brother   . Prostate cancer Brother        Deceased  . Alzheimer's disease Brother   . Heart disease Brother   . Alzheimer's disease Paternal Grandmother   . Early death Maternal Grandmother        Unknown  . Obesity Son   . Obesity Son        Gastric Bypass  . Healthy Son   . Healthy Daughter   . Diabetes Neg Hx     Allergies  Allergen Reactions  . Nsaids Other (See Comments) and Tinitus    Bruising   . Tolmetin Other (See Comments)    Bruising  . Ciprofloxacin Other (See Comments)    Other reaction(s): Other (See Comments) Achilles tendon Achilles tendon Achilles tendon Achilles tendon  . Statins Nausea And Vomiting and Other (See Comments)    Other reaction(s): Kidney issues Muscle pain Muscle pain Other reaction(s): Kidney issues Muscle pain Muscle pain Other reaction(s): Kidney issues Muscle pain Muscle pain Other reaction(s): Kidney issues Muscle pain  .  Requip [Ropinirole Hcl] Anxiety  .  Ropinirole Anxiety    Mood swing    Medication list has been reviewed and updated.  Current Outpatient Medications on File Prior to Visit  Medication Sig Dispense Refill  . allopurinol (ZYLOPRIM) 100 MG tablet Take 2 tablets (200 mg total) by mouth daily. 180 tablet 3  . ALPRAZolam (XANAX) 0.5 MG tablet TAKE 1 TABLET(0.5 MG) BY MOUTH THREE TIMES DAILY AS NEEDED FOR ANXIETY 90 tablet 3  . amoxicillin (AMOXIL) 500 MG capsule Take 4 caps (2gm) by mouth once prior to dental procedure 20 capsule 0  . azelastine (ASTELIN) 0.1 % nasal spray Place 2 sprays into both nostrils 2 (two) times daily. Use in each nostril as directed 30 mL 11  . b complex vitamins tablet Take 1 tablet by mouth daily.    . bethanechol (URECHOLINE) 25 MG tablet Take 1 tablet (25 mg total) by mouth 3 (three) times daily. 90 tablet 11  . Calcium 500 MG tablet Take 600 mg by mouth daily.    . Cholecalciferol (VITAMIN D3) 2000 UNITS capsule Take 2,000 Units by mouth daily.    . Darbepoetin Alfa (ARANESP, ALBUMIN FREE, IJ) Inject as directed.    . folic acid (FOLVITE) 1 MG tablet Take 2 tablets (2 mg total) by mouth daily. 60 tablet 4  . gabapentin (NEURONTIN) 300 MG capsule TAKE 1 CAPSULES BY MOUTH THREE TIMES DAILY, PLUS 2 CAPSULES EVERY DAY AS NEEDED 150 capsule 5  . HYDROcodone-acetaminophen (NORCO) 7.5-325 MG tablet Take 1 tablet by mouth every 6 (six) hours as needed for moderate pain. 100 tablet 0  . Magnesium Oxide 200 MG TABS Take by mouth.    . Multiple Vitamin (MULTIVITAMIN) tablet Take 1 tablet by mouth daily.    . Omega-3 Fatty Acids (OMEGA-3 FISH OIL PO) Take 2 each by mouth every morning. EPA/DHA 520/350    . polyethylene glycol (MIRALAX / GLYCOLAX) packet Take 17 g by mouth daily as needed.    . predniSONE (DELTASONE) 20 MG tablet TAKE 1 OR 2 TABLETS BY MOUTH DAILY FOR UP TO 3 DAYS AS NEEDED FOR GOUT FLARES 20 tablet 0  . psyllium (METAMUCIL) 58.6 % powder Take 1 packet by mouth  daily. 1 Teaspoon daily    . UNABLE TO FIND Med Name: B-6 VITAMIN    . vitamin C (ASCORBIC ACID) 500 MG tablet Take 500 mg by mouth daily.    Marland Kitchen zinc gluconate 50 MG tablet Take 50 mg by mouth daily.     No current facility-administered medications on file prior to visit.     Review of Systems:  As per HPI- otherwise negative. No fever or chills   Physical Examination: Vitals:   12/10/18 1336  BP: 122/60  Pulse: 96  Resp: 16  Temp: 98.2 F (36.8 C)  SpO2: 96%   Vitals:   12/10/18 1336  Weight: 210 lb (95.3 kg)  Height: 5' 9.5" (1.765 m)   Body mass index is 30.57 kg/m. Ideal Body Weight: Weight in (lb) to have BMI = 25: 171.4  GEN: WDWN, NAD, Non-toxic, A & O x 3, obese, looks well.   HEENT: Atraumatic, Normocephalic. Neck supple. No masses, No LAD. Ears and Nose: No external deformity. CV: RRR, No M/G/R. No JVD. No thrill. No extra heart sounds. PULM: CTA B, no wheezes, crackles, rhonchi. No retractions. No resp. distress. No accessory muscle use. He notes the left paraspinous muscles as the site of occasional spasm and pain  ABD: S, NT, ND, +BS. No rebound. No HSM. EXTR:  No c/c/e NEURO Normal gait for pt- uses a cane  PSYCH: Normally interactive. Conversant. Not depressed or anxious appearing.  Calm demeanor.  He has what appears to be an irritated seb k on the right lower flank  LN to skin lesion on right lower flank X3- tolerated well  Assessment and Plan:   ICD-10-CM   1. Chronic pain syndrome  G89.4 Pain Mgmt, Profile 8 w/Conf, U    CANCELED: Pain Mgmt, Profile 8 w/Conf, U  2. Chronic left-sided low back pain without sciatica  M54.5    G89.29   3. Skin lesion  L98.9    Following up today for chronic pain management Doing well on current hydrocodone and gabapentin regimen Ok to use prn prednisone up to once a week LN to benign skin lesion- he will consult derm if it does not drop off   Follow-up: No follow-ups on file.  Meds ordered this encounter   Medications  . predniSONE (DELTASONE) 20 MG tablet    Sig: TAKE 1 tablet daily as needed for muscle spasm.  Take no more than once a week    Dispense:  20 tablet    Refill:  0   Orders Placed This Encounter  Procedures  . Pain Mgmt, Profile 8 w/Conf, U   BP Readings from Last 3 Encounters:  12/10/18 122/60  11/25/18 (!) 145/70  10/21/18 126/61      Signed Lamar Blinks, MD

## 2018-12-13 LAB — PAIN MGMT, PROFILE 8 W/CONF, U
6 Acetylmorphine: NEGATIVE ng/mL
Alcohol Metabolites: POSITIVE ng/mL — AB (ref <500)
Alphahydroxyalprazolam: 170 ng/mL
Alphahydroxymidazolam: NEGATIVE ng/mL
Alphahydroxytriazolam: NEGATIVE ng/mL
Aminoclonazepam: NEGATIVE ng/mL
Amphetamines: NEGATIVE ng/mL
Benzodiazepines: POSITIVE ng/mL
Buprenorphine, Urine: NEGATIVE ng/mL
Cocaine Metabolite: NEGATIVE ng/mL
Codeine: NEGATIVE ng/mL
Creatinine: 117.4 mg/dL
Ethyl Glucuronide (ETG): 2289 ng/mL
Ethyl Sulfate (ETS): 531 ng/mL
Hydrocodone: 645 ng/mL
Hydromorphone: 192 ng/mL
Hydroxyethylflurazepam: NEGATIVE ng/mL
Lorazepam: NEGATIVE ng/mL
MDMA: NEGATIVE ng/mL
Marijuana Metabolite: NEGATIVE ng/mL
Morphine: NEGATIVE ng/mL
Nordiazepam: NEGATIVE ng/mL
Norhydrocodone: 1362 ng/mL
Opiates: POSITIVE ng/mL
Oxazepam: NEGATIVE ng/mL
Oxidant: NEGATIVE ug/mL
Oxycodone: NEGATIVE ng/mL
Temazepam: NEGATIVE ng/mL
pH: 6.5 (ref 4.5–9.0)

## 2018-12-24 ENCOUNTER — Other Ambulatory Visit: Payer: Self-pay

## 2018-12-24 ENCOUNTER — Inpatient Hospital Stay: Payer: Medicare Other | Attending: Hematology & Oncology

## 2018-12-24 ENCOUNTER — Inpatient Hospital Stay: Payer: Medicare Other

## 2018-12-24 ENCOUNTER — Inpatient Hospital Stay (HOSPITAL_BASED_OUTPATIENT_CLINIC_OR_DEPARTMENT_OTHER): Payer: Medicare Other | Admitting: Hematology & Oncology

## 2018-12-24 VITALS — BP 129/70 | HR 97 | Temp 98.9°F | Resp 17 | Wt 213.5 lb

## 2018-12-24 DIAGNOSIS — R05 Cough: Secondary | ICD-10-CM | POA: Insufficient documentation

## 2018-12-24 DIAGNOSIS — Z8572 Personal history of non-Hodgkin lymphomas: Secondary | ICD-10-CM | POA: Insufficient documentation

## 2018-12-24 DIAGNOSIS — M109 Gout, unspecified: Secondary | ICD-10-CM | POA: Diagnosis not present

## 2018-12-24 DIAGNOSIS — Z79899 Other long term (current) drug therapy: Secondary | ICD-10-CM | POA: Insufficient documentation

## 2018-12-24 DIAGNOSIS — D6481 Anemia due to antineoplastic chemotherapy: Secondary | ICD-10-CM | POA: Insufficient documentation

## 2018-12-24 DIAGNOSIS — C9310 Chronic myelomonocytic leukemia not having achieved remission: Secondary | ICD-10-CM | POA: Diagnosis not present

## 2018-12-24 DIAGNOSIS — R5383 Other fatigue: Secondary | ICD-10-CM | POA: Diagnosis not present

## 2018-12-24 DIAGNOSIS — F419 Anxiety disorder, unspecified: Secondary | ICD-10-CM | POA: Diagnosis not present

## 2018-12-24 LAB — CBC WITH DIFFERENTIAL (CANCER CENTER ONLY)
Abs Immature Granulocytes: 0.21 10*3/uL — ABNORMAL HIGH (ref 0.00–0.07)
Basophils Absolute: 0 10*3/uL (ref 0.0–0.1)
Basophils Relative: 0 %
Eosinophils Absolute: 0 10*3/uL (ref 0.0–0.5)
Eosinophils Relative: 0 %
HCT: 28.3 % — ABNORMAL LOW (ref 39.0–52.0)
Hemoglobin: 9.3 g/dL — ABNORMAL LOW (ref 13.0–17.0)
Immature Granulocytes: 3 %
Lymphocytes Relative: 30 %
Lymphs Abs: 2.5 10*3/uL (ref 0.7–4.0)
MCH: 37.5 pg — ABNORMAL HIGH (ref 26.0–34.0)
MCHC: 32.9 g/dL (ref 30.0–36.0)
MCV: 114.1 fL — ABNORMAL HIGH (ref 80.0–100.0)
Monocytes Absolute: 3 10*3/uL — ABNORMAL HIGH (ref 0.1–1.0)
Monocytes Relative: 35 %
Neutro Abs: 2.7 10*3/uL (ref 1.7–7.7)
Neutrophils Relative %: 32 %
Platelet Count: 94 10*3/uL — ABNORMAL LOW (ref 150–400)
RBC: 2.48 MIL/uL — ABNORMAL LOW (ref 4.22–5.81)
RDW: 14.8 % (ref 11.5–15.5)
WBC Count: 8.4 10*3/uL (ref 4.0–10.5)
nRBC: 0 % (ref 0.0–0.2)

## 2018-12-24 LAB — CMP (CANCER CENTER ONLY)
ALT: 13 U/L (ref 0–44)
AST: 13 U/L — ABNORMAL LOW (ref 15–41)
Albumin: 3.9 g/dL (ref 3.5–5.0)
Alkaline Phosphatase: 61 U/L (ref 38–126)
Anion gap: 9 (ref 5–15)
BUN: 22 mg/dL (ref 8–23)
CO2: 27 mmol/L (ref 22–32)
Calcium: 9.4 mg/dL (ref 8.9–10.3)
Chloride: 106 mmol/L (ref 98–111)
Creatinine: 1.41 mg/dL — ABNORMAL HIGH (ref 0.61–1.24)
GFR, Est AFR Am: 51 mL/min — ABNORMAL LOW (ref >60)
GFR, Estimated: 44 mL/min — ABNORMAL LOW (ref >60)
Glucose, Bld: 122 mg/dL — ABNORMAL HIGH (ref 70–99)
Potassium: 3.8 mmol/L (ref 3.5–5.1)
Sodium: 142 mmol/L (ref 135–145)
Total Bilirubin: 0.6 mg/dL (ref 0.3–1.2)
Total Protein: 7 g/dL (ref 6.5–8.1)

## 2018-12-24 LAB — RETICULOCYTES
Immature Retic Fract: 26.6 % — ABNORMAL HIGH (ref 2.3–15.9)
RBC.: 2.48 MIL/uL — ABNORMAL LOW (ref 4.22–5.81)
Retic Count, Absolute: 43.4 10*3/uL (ref 19.0–186.0)
Retic Ct Pct: 1.8 % (ref 0.4–3.1)

## 2018-12-24 MED ORDER — DARBEPOETIN ALFA 300 MCG/0.6ML IJ SOSY
300.0000 ug | PREFILLED_SYRINGE | Freq: Once | INTRAMUSCULAR | Status: AC
  Administered 2018-12-24: 300 ug via SUBCUTANEOUS

## 2018-12-24 MED ORDER — DARBEPOETIN ALFA 300 MCG/0.6ML IJ SOSY
PREFILLED_SYRINGE | INTRAMUSCULAR | Status: AC
  Filled 2018-12-24: qty 0.6

## 2018-12-24 NOTE — Progress Notes (Signed)
Hematology and Oncology Follow Up Visit  EIAN VANDERVELDEN [id number] [birth year] 85 y.o. 12/24/2018   Principle Diagnosis:    History of diffuse large cell non-Hodgkin's lymphoma-treated in [place]  Chronic Myelomonocytic Leukemia (CMMoL) - Normal cytogenetics  Current Therapy:    Aranesp 300 mcg sq q 3 weeks for Hgb < 11     Interim History:  Mr. Star is back for follow-up.  Unfortunately, he had a attack of gout.  This was in his left foot.  It is better now.  He had been on allopurinol.  He still has the attack.  He was put on some prednisone which seemed to help.  He is looking forward to his trip back up to [place].  He goes every year.  His family needs him up there.  They have a cabin that they have had for years.  He is not able to work out where he lives.  The gym is closed because of the coronavirus.  The pool opened up so he is able to do little swimming.  He gained a little bit of weight which he is not happy about.  He has had no fever.  He has had no nausea or vomiting.  He has had no change in bowel or bladder habits.  Overall, his performance status is ECOG 1.  Medications:  Current Outpatient Medications:  .  allopurinol (ZYLOPRIM) 100 MG tablet, Take 2 tablets (200 mg total) by mouth daily., Disp: 180 tablet, Rfl: 3 .  ALPRAZolam (XANAX) 0.5 MG tablet, TAKE 1 TABLET(0.5 MG) BY MOUTH THREE TIMES DAILY AS NEEDED FOR ANXIETY, Disp: 90 tablet, Rfl: 3 .  amoxicillin (AMOXIL) 500 MG capsule, Take 4 caps (2gm) by mouth once prior to dental procedure, Disp: 20 capsule, Rfl: 0 .  azelastine (ASTELIN) 0.1 % nasal spray, Place 2 sprays into both nostrils 2 (two) times daily. Use in each nostril as directed, Disp: 30 mL, Rfl: 11 .  b complex vitamins tablet, Take 1 tablet by mouth daily., Disp: , Rfl:  .  bethanechol (URECHOLINE) 25 MG tablet, Take 1 tablet (25 mg total) by mouth 3 (three) times daily., Disp: 90 tablet, Rfl: 11 .  Calcium 500 MG tablet, Take 600 mg by  mouth daily., Disp: , Rfl:  .  Cholecalciferol (VITAMIN D3) 2000 UNITS capsule, Take 2,000 Units by mouth daily., Disp: , Rfl:  .  Darbepoetin Alfa (ARANESP, ALBUMIN FREE, IJ), Inject as directed., Disp: , Rfl:  .  folic acid (FOLVITE) 1 MG tablet, Take 2 tablets (2 mg total) by mouth daily., Disp: 60 tablet, Rfl: 4 .  gabapentin (NEURONTIN) 300 MG capsule, TAKE 1 CAPSULES BY MOUTH THREE TIMES DAILY, PLUS 2 CAPSULES EVERY DAY AS NEEDED, Disp: 150 capsule, Rfl: 5 .  HYDROcodone-acetaminophen (NORCO) 7.5-325 MG tablet, Take 1 tablet by mouth every 6 (six) hours as needed for moderate pain., Disp: 100 tablet, Rfl: 0 .  Magnesium Oxide 200 MG TABS, Take by mouth., Disp: , Rfl:  .  Multiple Vitamin (MULTIVITAMIN) tablet, Take 1 tablet by mouth daily., Disp: , Rfl:  .  Omega-3 Fatty Acids (OMEGA-3 FISH OIL PO), Take 2 each by mouth every morning. EPA/DHA 520/350, Disp: , Rfl:  .  polyethylene glycol (MIRALAX / GLYCOLAX) packet, Take 17 g by mouth daily as needed., Disp: , Rfl:  .  predniSONE (DELTASONE) 20 MG tablet, TAKE 1 tablet daily as needed for muscle spasm.  Take no more than once a week, Disp: 20 tablet, Rfl: 0 .  psyllium (METAMUCIL) 58.6 % powder, Take 1 packet by mouth daily. 1 Teaspoon daily, Disp: , Rfl:  .  UNABLE TO FIND, Med Name: B-6 VITAMIN, Disp: , Rfl:  .  vitamin C (ASCORBIC ACID) 500 MG tablet, Take 500 mg by mouth daily., Disp: , Rfl:  .  zinc gluconate 50 MG tablet, Take 50 mg by mouth daily., Disp: , Rfl:  No current facility-administered medications for this visit.   Facility-Administered Medications Ordered in Other Visits:  .  Darbepoetin Alfa (ARANESP) injection 300 mcg, 300 mcg, Subcutaneous, Once, Ennever, Rudell Cobb, MD  Allergies:  Allergies  Allergen Reactions  . Nsaids Other (See Comments) and Tinitus    Bruising   . Tolmetin Other (See Comments)    Bruising  . Ciprofloxacin Other (See Comments)    Other reaction(s): Other (See Comments) Achilles tendon  Achilles tendon Achilles tendon Achilles tendon  . Statins Nausea And Vomiting and Other (See Comments)    Other reaction(s): Kidney issues Muscle pain Muscle pain Other reaction(s): Kidney issues Muscle pain Muscle pain Other reaction(s): Kidney issues Muscle pain Muscle pain Other reaction(s): Kidney issues Muscle pain  . Requip [Ropinirole Hcl] Anxiety  . Ropinirole Anxiety    Mood swing    Past Medical History, Surgical history, Social history, and Family History were reviewed and updated.  Review of Systems: Review of Systems  Constitutional: Positive for malaise/fatigue.  HENT: Negative.   Eyes: Negative.   Respiratory: Positive for cough.   Cardiovascular: Negative.   Gastrointestinal: Negative.   Genitourinary: Negative.   Musculoskeletal: Negative.   Skin: Negative.   Neurological: Negative.   Endo/Heme/Allergies: Negative.   Psychiatric/Behavioral: Negative.      Physical Exam:  weight is 213 lb 8 oz (96.8 kg). His oral temperature is 98.9 F (37.2 C). His blood pressure is 129/70 and his pulse is 97. His respiration is 17 and oxygen saturation is 97%.   Wt Readings from Last 3 Encounters:  12/24/18 213 lb 8 oz (96.8 kg)  12/10/18 210 lb (95.3 kg)  11/25/18 202 lb 3.2 oz (91.7 kg)     Physical Exam Vitals signs reviewed.  HENT:     Head: Normocephalic and atraumatic.  Eyes:     Pupils: Pupils are equal, round, and reactive to light.  Neck:     Musculoskeletal: Normal range of motion.  Cardiovascular:     Rate and Rhythm: Normal rate and regular rhythm.     Heart sounds: Murmur present.     Comments: In listening to his cardiac exam, he has a 2/6 systolic ejection murmur. Pulmonary:     Effort: Pulmonary effort is normal.     Breath sounds: Normal breath sounds.  Abdominal:     General: Bowel sounds are normal.     Palpations: Abdomen is soft.  Musculoskeletal: Normal range of motion.        General: No tenderness or deformity.   Lymphadenopathy:     Cervical: No cervical adenopathy.  Skin:    General: Skin is warm and dry.     Findings: No erythema or rash.  Neurological:     Mental Status: He is alert and oriented to person, place, and time.  Psychiatric:        Behavior: Behavior normal.        Thought Content: Thought content normal.        Judgment: Judgment normal.     Lab Results  Component Value Date   WBC 8.4 12/24/2018   HGB 9.3 (  L) 12/24/2018   HCT 28.3 (L) 12/24/2018   MCV 114.1 (H) 12/24/2018   PLT 94 (L) 12/24/2018     Chemistry      Component Value Date/Time   NA 142 12/24/2018 1307   NA 139 05/08/2017 1123   NA 141 05/15/2016 1126   K 3.8 12/24/2018 1307   K 4.0 05/08/2017 1123   K 4.3 05/15/2016 1126   CL 106 12/24/2018 1307   CL 105 05/08/2017 1123   CO2 27 12/24/2018 1307   CO2 30 05/08/2017 1123   CO2 26 05/15/2016 1126   BUN 22 12/24/2018 1307   BUN 15 05/08/2017 1123   BUN 29.1 (H) 05/15/2016 1126   CREATININE 1.41 (H) 12/24/2018 1307   CREATININE 1.2 05/08/2017 1123   CREATININE 1.7 (H) 05/15/2016 1126   GLU 90 11/15/2014      Component Value Date/Time   CALCIUM 9.4 12/24/2018 1307   CALCIUM 9.1 05/08/2017 1123   CALCIUM 9.7 05/15/2016 1126   ALKPHOS 61 12/24/2018 1307   ALKPHOS 64 05/08/2017 1123   ALKPHOS 69 05/15/2016 1126   AST 13 (L) 12/24/2018 1307   AST 23 05/15/2016 1126   ALT 13 12/24/2018 1307   ALT 19 05/08/2017 1123   ALT 22 05/15/2016 1126   BILITOT 0.6 12/24/2018 1307   BILITOT 0.84 05/15/2016 1126         Impression and Plan: Mr. Soulliere is a 85 year old white male. He had a history of diffuse large cell non-Hodgkin's lymphoma. He was treated  with 6 cycles of R-CHOP. He got this treatment up in [place]. He completed this back in April 2014.   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 This could be a residual from his chemotherapy for his lymphoma.  He does need any Aranesp today.  I am glad that we will give it to him before he  goes up to [place].  I know it will make him feel better.  I will plan to see him back after he gets home from [place].  I would not anticipate any problems that he would have up there.    [name], MD 7/1/20202:33 PM

## 2018-12-24 NOTE — Patient Instructions (Signed)
Darbepoetin Alfa injection What is this medicine? DARBEPOETIN ALFA (dar be POE e tin AL fa) helps your body make more red blood cells. It is used to treat anemia caused by chronic kidney failure and chemotherapy. This medicine may be used for other purposes; ask your health care provider or pharmacist if you have questions. COMMON BRAND NAME(S): Aranesp What should I tell my health care provider before I take this medicine? They need to know if you have any of these conditions:  blood clotting disorders or history of blood clots  cancer patient not on chemotherapy  cystic fibrosis  heart disease, such as angina, heart failure, or a history of a heart attack  hemoglobin level of 12 g/dL or greater  high blood pressure  low levels of folate, iron, or vitamin B12  seizures  an unusual or allergic reaction to darbepoetin, erythropoietin, albumin, hamster proteins, latex, other medicines, foods, dyes, or preservatives  pregnant or trying to get pregnant  breast-feeding How should I use this medicine? This medicine is for injection into a vein or under the skin. It is usually given by a health care professional in a hospital or clinic setting. If you get this medicine at home, you will be taught how to prepare and give this medicine. Use exactly as directed. Take your medicine at regular intervals. Do not take your medicine more often than directed. It is important that you put your used needles and syringes in a special sharps container. Do not put them in a trash can. If you do not have a sharps container, call your pharmacist or healthcare provider to get one. A special MedGuide will be given to you by the pharmacist with each prescription and refill. Be sure to read this information carefully each time. Talk to your pediatrician regarding the use of this medicine in children. While this medicine may be used in children as young as 1 month of age for selected conditions, precautions do  apply. Overdosage: If you think you have taken too much of this medicine contact a poison control center or emergency room at once. NOTE: This medicine is only for you. Do not share this medicine with others. What if I miss a dose? If you miss a dose, take it as soon as you can. If it is almost time for your next dose, take only that dose. Do not take double or extra doses. What may interact with this medicine? Do not take this medicine with any of the following medications:  epoetin alfa This list may not describe all possible interactions. Give your health care provider a list of all the medicines, herbs, non-prescription drugs, or dietary supplements you use. Also tell them if you smoke, drink alcohol, or use illegal drugs. Some items may interact with your medicine. What should I watch for while using this medicine? Your condition will be monitored carefully while you are receiving this medicine. You may need blood work done while you are taking this medicine. This medicine may cause a decrease in vitamin B6. You should make sure that you get enough vitamin B6 while you are taking this medicine. Discuss the foods you eat and the vitamins you take with your health care professional. What side effects may I notice from receiving this medicine? Side effects that you should report to your doctor or health care professional as soon as possible:  allergic reactions like skin rash, itching or hives, swelling of the face, lips, or tongue  breathing problems  changes in   vision  chest pain  confusion, trouble speaking or understanding  feeling faint or lightheaded, falls  high blood pressure  muscle aches or pains  pain, swelling, warmth in the leg  rapid weight gain  severe headaches  sudden numbness or weakness of the face, arm or leg  trouble walking, dizziness, loss of balance or coordination  seizures (convulsions)  swelling of the ankles, feet, hands  unusually weak or  tired Side effects that usually do not require medical attention (report to your doctor or health care professional if they continue or are bothersome):  diarrhea  fever, chills (flu-like symptoms)  headaches  nausea, vomiting  redness, stinging, or swelling at site where injected This list may not describe all possible side effects. Call your doctor for medical advice about side effects. You may report side effects to FDA at 1-800-FDA-1088. Where should I keep my medicine? Keep out of the reach of children. Store in a refrigerator between 2 and 8 degrees C (36 and 46 degrees F). Do not freeze. Do not shake. Throw away any unused portion if using a single-dose vial. Throw away any unused medicine after the expiration date. NOTE: This sheet is a summary. It may not cover all possible information. If you have questions about this medicine, talk to your doctor, pharmacist, or health care provider.  2020 Elsevier/Gold Standard (2017-06-26 16:44:20)  

## 2018-12-25 ENCOUNTER — Other Ambulatory Visit: Payer: Self-pay

## 2018-12-25 LAB — IRON AND TIBC
Iron: 118 ug/dL (ref 42–163)
Saturation Ratios: 51 % (ref 20–55)
TIBC: 233 ug/dL (ref 202–409)
UIBC: 115 ug/dL — ABNORMAL LOW (ref 117–376)

## 2018-12-25 LAB — FERRITIN: Ferritin: 415 ng/mL — ABNORMAL HIGH (ref 24–336)

## 2018-12-28 NOTE — Progress Notes (Addendum)
[place] at [facility] [address] [place], [place] [address] [phone]  Date:  12/29/2018   Name:  Jeremiah Walker   DOB:  [birth year]   MRN:  [id number]  PCP:  [name], MD    Chief Complaint: Gout   History of Present Illness:  Jeremiah Walker is a 85 y.o. very pleasant male patient who presents with the following:  In person visit today for concern of recurrent gout- he would like to check a uric acid level He is taking allopurinol-200 mg now- wants to know if this needs to be adjusted   History of non- hodgkin's lymphoma treated in 2014, and now CML.  He also has chronic pain due to lower back and peripheral neuropathy, CRI, HTN He is on chronic hydrocodone for his pain  Some recent labs done per oncology- last visit on 7/1 and got Aranesp  He will be spending some time in [place] at his cottage coming up A lot of his family will be there  He is really looking forward to this   He has chronic poor sleep- he is able to manage this with melatonin and xanax.  He often sleeps in his recliner as it is more comfortable for his back    Patient Active Problem List   Diagnosis Date Noted  . 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 Chronic pain syndrome 09/26/2015  . De Quervain's tenosynovitis, right 09/13/2015  . Anemia due to antineoplastic chemotherapy 08/15/2015  . DLBCL (diffuse large B cell lymphoma) ([place]) 08/15/2015  . Skin lesion of face 05/21/2015  . Peripheral neuropathy due to chemotherapy ([place]) 02/09/2015  . Benign prostatic hyperplasia with urinary obstruction 02/09/2015  . Bilateral hearing loss 02/09/2015  . Low back pain 02/09/2015  . Chronic kidney disease (CKD), stage III (moderate) ([place]) 02/09/2015  . Essential (primary) hypertension 02/09/2015  . Gastro-esophageal reflux disease without esophagitis 02/09/2015  . Anxiety, generalized 02/09/2015  .  Cannot sleep 02/09/2015  . Combined fat and carbohydrate induced hyperlipemia 02/09/2015    Past Medical History:  Diagnosis Date  . Anemia   . Anemia due to antineoplastic chemotherapy 08/15/2015  . Anxiety   . Arthritis   . Cancer ([place])   . Cataract   . Chronic kidney disease (CKD), stage III (moderate) ([facility]) 02/09/2015   Controlled  . CMML (chronic myelomonocytic leukemia) ([place])   . CMML (chronic myelomonocytic leukemia) ([place])   . Combined fat and carbohydrate induced hyperlipemia 02/09/2015   Controlled  . Depression   . DLBCL (diffuse large B cell lymphoma) ([place]) 08/15/2015  . Essential (primary) hypertension 02/09/2015  . Gastro-esophageal reflux disease without esophagitis 02/09/2015   Controlled  . Neuromuscular disorder ([place])   . Tinnitus     Past Surgical History:  Procedure Laterality Date  . CATARACT EXTRACTION, BILATERAL    . COLONOSCOPY  May 2011  . CYSTOSCOPY    . TOTAL HIP ARTHROPLASTY  2003   Right   . TOTAL HIP ARTHROPLASTY  April, 2011   Left  . TRIGGER FINGER RELEASE  Nov 2013  . TURBINECTOMY  2006  . UPPER GI ENDOSCOPY  Nov 2015    Social History   Tobacco Use  . Smoking status: Former Research scientist (life sciences)  . Smokeless tobacco: Never Used  . Tobacco comment: Quit >50 years ago  Substance Use Topics  . Alcohol use: Yes    Alcohol/week: 1.0 - 4.0 standard drinks  Types: 1 - 4 Shots of liquor per week    Comment: 4 glasses per week  . Drug use: No    Family History  Problem Relation Age of Onset  . Anuerysm Mother 84       Deceased  . Colon cancer Father 99       Deceased  . Colon cancer Brother   . Prostate cancer Brother        Deceased  . Alzheimer's disease Brother   . Heart disease Brother   . Alzheimer's disease Paternal Grandmother   . Early death Maternal Grandmother        Unknown  . Obesity Son   . Obesity Son        Gastric Bypass  . Healthy Son   . Healthy Daughter   . Diabetes Neg Hx     Allergies  Allergen Reactions  .  Nsaids Other (See Comments) and Tinitus    Bruising   . Tolmetin Other (See Comments)    Bruising  . Ciprofloxacin Other (See Comments)    Other reaction(s): Other (See Comments) Achilles tendon Achilles tendon Achilles tendon Achilles tendon  . Statins Nausea And Vomiting and Other (See Comments)    Other reaction(s): Kidney issues Muscle pain Muscle pain Other reaction(s): Kidney issues Muscle pain Muscle pain Other reaction(s): Kidney issues Muscle pain Muscle pain Other reaction(s): Kidney issues Muscle pain  . Requip [Ropinirole Hcl] Anxiety  . Ropinirole Anxiety    Mood swing    Medication list has been reviewed and updated.  Current Outpatient Medications on File Prior to Visit  Medication Sig Dispense Refill  . allopurinol (ZYLOPRIM) 100 MG tablet Take 2 tablets (200 mg total) by mouth daily. 180 tablet 3  . ALPRAZolam (XANAX) 0.5 MG tablet TAKE 1 TABLET(0.5 MG) BY MOUTH THREE TIMES DAILY AS NEEDED FOR ANXIETY 90 tablet 3  . amoxicillin (AMOXIL) 500 MG capsule Take 4 caps (2gm) by mouth once prior to dental procedure 20 capsule 0  . azelastine (ASTELIN) 0.1 % nasal spray Place 2 sprays into both nostrils 2 (two) times daily. Use in each nostril as directed 30 mL 11  . b complex vitamins tablet Take 1 tablet by mouth daily.    . bethanechol (URECHOLINE) 25 MG tablet Take 1 tablet (25 mg total) by mouth 3 (three) times daily. 90 tablet 11  . Calcium 500 MG tablet Take 600 mg by mouth daily.    . Cholecalciferol (VITAMIN D3) 2000 UNITS capsule Take 2,000 Units by mouth daily.    . Darbepoetin Alfa (ARANESP, ALBUMIN FREE, IJ) Inject as directed.    . folic acid (FOLVITE) 1 MG tablet Take 2 tablets (2 mg total) by mouth daily. 60 tablet 4  . gabapentin (NEURONTIN) 300 MG capsule TAKE 1 CAPSULES BY MOUTH THREE TIMES DAILY, PLUS 2 CAPSULES EVERY DAY AS NEEDED 150 capsule 5  . Magnesium Oxide 200 MG TABS Take by mouth.    . Multiple Vitamin (MULTIVITAMIN) tablet Take 1  tablet by mouth daily.    . Omega-3 Fatty Acids (OMEGA-3 FISH OIL PO) Take 2 each by mouth every morning. EPA/DHA 520/350    . polyethylene glycol (MIRALAX / GLYCOLAX) packet Take 17 g by mouth daily as needed.    . predniSONE (DELTASONE) 20 MG tablet TAKE 1 tablet daily as needed for muscle spasm.  Take no more than once a week 20 tablet 0  . psyllium (METAMUCIL) 58.6 % powder Take 1 packet by mouth daily. 1 Teaspoon  daily    . UNABLE TO FIND Med Name: B-6 VITAMIN    . vitamin C (ASCORBIC ACID) 500 MG tablet Take 500 mg by mouth daily.    Marland Kitchen zinc gluconate 50 MG tablet Take 50 mg by mouth daily.     No current facility-administered medications on file prior to visit.     Review of Systems:  As per HPI- otherwise negative. No fever or chills He is feeling generally fine  He has noted some thickening and discomfort of his toenails, it can be hard for him to clip his nails  Physical Examination: Vitals:   12/29/18 1543  BP: 128/68  Pulse: 88  Resp: 16  Temp: 98.2 F (36.8 C)  SpO2: 96%   Vitals:   12/29/18 1543  Weight: 210 lb (95.3 kg)  Height: 5' 9.5" (1.765 m)   Body mass index is 30.57 kg/m. Ideal Body Weight: Weight in (lb) to have BMI = 25: 171.4  GEN: WDWN, NAD, Non-toxic, A & O x 3, obese, looks well  HEENT: Atraumatic, Normocephalic. Neck supple. No masses, No LAD. Ears and Nose: No external deformity. CV: RRR, No M/G/R. No JVD. No thrill. No extra heart sounds. PULM: CTA B, no wheezes, crackles, rhonchi. No retractions. No resp. distress. No accessory muscle use EXTR: No c/c/e NEURO Normal gait.  PSYCH: Normally interactive. Conversant. Not depressed or anxious appearing.  Calm demeanor.  Thickened toenails esp on the great toes bilaterally The other nails are somewhat thickened and long, may be difficult for him to cut at home  Normal pulses in his feet which are warm and well perfused   Assessment and Plan:   ICD-10-CM   1. Idiopathic gout, unspecified  chronicity, unspecified site  M10.00 Uric acid  2. Toenail deformity  L60.8 Ambulatory referral to Podiatry  3. Persistent insomnia  G47.00   4. Chronic left-sided low back pain without sciatica  M54.5    G89.29   5. Other polyneuropathy  G62.89    History of gout- will check a uric acid level for him today and see if we need to adjust his allopurinol dose Referral to podiatry to check on his toenails  I refilled his hydrocodone earlier today   Follow-up: No follow-ups on file.  No orders of the defined types were placed in this encounter.  Orders Placed This Encounter  Procedures  . Uric acid  . Ambulatory referral to [place], MD  Addendum 7/7, received his uric acid level  Results for orders placed or performed in visit on 12/29/18  Uric acid  Result Value Ref Range   Uric Acid, Serum 6.1 4.0 - 7.8 mg/dL   Creat clearance is 47 Message to patient

## 2018-12-29 ENCOUNTER — Other Ambulatory Visit: Payer: Self-pay

## 2018-12-29 ENCOUNTER — Encounter: Payer: Self-pay | Admitting: Family Medicine

## 2018-12-29 ENCOUNTER — Ambulatory Visit (INDEPENDENT_AMBULATORY_CARE_PROVIDER_SITE_OTHER): Payer: Medicare Other | Admitting: Family Medicine

## 2018-12-29 VITALS — BP 128/68 | HR 88 | Temp 98.2°F | Resp 16 | Ht 69.5 in | Wt 210.0 lb

## 2018-12-29 DIAGNOSIS — G8929 Other chronic pain: Secondary | ICD-10-CM | POA: Diagnosis not present

## 2018-12-29 DIAGNOSIS — G6289 Other specified polyneuropathies: Secondary | ICD-10-CM

## 2018-12-29 DIAGNOSIS — G4700 Insomnia, unspecified: Secondary | ICD-10-CM | POA: Diagnosis not present

## 2018-12-29 DIAGNOSIS — M545 Low back pain, unspecified: Secondary | ICD-10-CM

## 2018-12-29 DIAGNOSIS — M1000 Idiopathic gout, unspecified site: Secondary | ICD-10-CM | POA: Diagnosis not present

## 2018-12-29 DIAGNOSIS — L608 Other nail disorders: Secondary | ICD-10-CM | POA: Diagnosis not present

## 2018-12-29 DIAGNOSIS — G894 Chronic pain syndrome: Secondary | ICD-10-CM

## 2018-12-29 MED ORDER — HYDROCODONE-ACETAMINOPHEN 7.5-325 MG PO TABS: 1.0000 | ORAL_TABLET | Freq: Four times a day (QID) | ORAL | 0 refills | Status: DC | PRN

## 2018-12-29 NOTE — Patient Instructions (Signed)
Great to see you as always- have a wonderful trip to [place] I will be in touch with your uric acid level asap- if needed we can adjust your allopurinol dosage   We will set you up to see podiatry about your toenails Please consider having the new shingles vaccine- shingrix- given at your pharmacy

## 2018-12-30 ENCOUNTER — Encounter: Payer: Self-pay | Admitting: Family Medicine

## 2018-12-30 DIAGNOSIS — M1000 Idiopathic gout, unspecified site: Secondary | ICD-10-CM

## 2018-12-30 LAB — URIC ACID: Uric Acid, Serum: 6.1 mg/dL (ref 4.0–7.8)

## 2018-12-30 MED ORDER — ALLOPURINOL 300 MG PO TABS: 300.0000 mg | ORAL_TABLET | Freq: Every day | ORAL | 3 refills | Status: DC

## 2019-01-22 ENCOUNTER — Ambulatory Visit (INDEPENDENT_AMBULATORY_CARE_PROVIDER_SITE_OTHER): Payer: Medicare Other | Admitting: Podiatry

## 2019-01-22 ENCOUNTER — Other Ambulatory Visit: Payer: Self-pay

## 2019-01-22 VITALS — Temp 98.1°F

## 2019-01-22 DIAGNOSIS — G620 Drug-induced polyneuropathy: Secondary | ICD-10-CM | POA: Diagnosis not present

## 2019-01-22 DIAGNOSIS — B351 Tinea unguium: Secondary | ICD-10-CM | POA: Diagnosis not present

## 2019-01-22 DIAGNOSIS — I872 Venous insufficiency (chronic) (peripheral): Secondary | ICD-10-CM | POA: Diagnosis not present

## 2019-01-22 DIAGNOSIS — M79609 Pain in unspecified limb: Secondary | ICD-10-CM

## 2019-01-22 NOTE — Progress Notes (Signed)
Subjective:  Patient ID: Jeremiah Walker, male    DOB: 14-Apr-1928,  MRN: [id number]  Chief Complaint  Patient presents with   Nail Problem    Nail dystrophy bilateral 1st nails. Pt states left 1st has dark spot, 25mo duration, no known injury.    Foot Problem    Pt states swelling in both feet. No known injury 84mo duration.    85 y.o. male presents with the above complaint.  Reports discoloration to the left great toenail  Review of Systems: Negative except as noted in the HPI. Denies N/V/F/Ch.  Past Medical History:  Diagnosis Date   Anemia    Anemia due to antineoplastic chemotherapy 08/15/2015   Anxiety    Arthritis    Cancer ([facility])    Cataract    Chronic kidney disease (CKD), stage III (moderate) ([place]) 02/09/2015   Controlled   CMML (chronic myelomonocytic leukemia) ([facility])    CMML (chronic myelomonocytic leukemia) ([place])    Combined fat and carbohydrate induced hyperlipemia 02/09/2015   Controlled   Depression    DLBCL (diffuse large B cell lymphoma) ([place]) 08/15/2015   Essential (primary) hypertension 02/09/2015   Gastro-esophageal reflux disease without esophagitis 02/09/2015   Controlled   Neuromuscular disorder (HCC)    Tinnitus     Current Outpatient Medications:    allopurinol (ZYLOPRIM) 300 MG tablet, Take 1 tablet (300 mg total) by mouth daily., Disp: 90 tablet, Rfl: 3   ALPRAZolam (XANAX) 0.5 MG tablet, TAKE 1 TABLET(0.5 MG) BY MOUTH THREE TIMES DAILY AS NEEDED FOR ANXIETY, Disp: 90 tablet, Rfl: 3   amoxicillin (AMOXIL) 500 MG capsule, Take 4 caps (2gm) by mouth once prior to dental procedure, Disp: 20 capsule, Rfl: 0   azelastine (ASTELIN) 0.1 % nasal spray, Place 2 sprays into both nostrils 2 (two) times daily. Use in each nostril as directed, Disp: 30 mL, Rfl: 11   b complex vitamins tablet, Take 1 tablet by mouth daily., Disp: , Rfl:    bethanechol (URECHOLINE) 25 MG tablet, Take 1 tablet (25 mg total) by mouth 3 (three) times daily., Disp:  90 tablet, Rfl: 11   Calcium 500 MG tablet, Take 600 mg by mouth daily., Disp: , Rfl:    Cholecalciferol (VITAMIN D3) 2000 UNITS capsule, Take 2,000 Units by mouth daily., Disp: , Rfl:    Darbepoetin Alfa (ARANESP, ALBUMIN FREE, IJ), Inject as directed., Disp: , Rfl:    folic acid (FOLVITE) 1 MG tablet, Take 2 tablets (2 mg total) by mouth daily., Disp: 60 tablet, Rfl: 4   gabapentin (NEURONTIN) 300 MG capsule, TAKE 1 CAPSULES BY MOUTH THREE TIMES DAILY, PLUS 2 CAPSULES EVERY DAY AS NEEDED, Disp: 150 capsule, Rfl: 5   HYDROcodone-acetaminophen (NORCO) 7.5-325 MG tablet, Take 1 tablet by mouth every 6 (six) hours as needed for moderate pain., Disp: 100 tablet, Rfl: 0   Magnesium Oxide 200 MG TABS, Take by mouth., Disp: , Rfl:    Multiple Vitamin (MULTIVITAMIN) tablet, Take 1 tablet by mouth daily., Disp: , Rfl:    Omega-3 Fatty Acids (OMEGA-3 FISH OIL PO), Take 2 each by mouth every morning. EPA/DHA 520/350, Disp: , Rfl:    polyethylene glycol (MIRALAX / GLYCOLAX) packet, Take 17 g by mouth daily as needed., Disp: , Rfl:    predniSONE (DELTASONE) 20 MG tablet, TAKE 1 tablet daily as needed for muscle spasm.  Take no more than once a week, Disp: 20 tablet, Rfl: 0   psyllium (METAMUCIL) 58.6 % powder, Take 1 packet by mouth  daily. 1 Teaspoon daily, Disp: , Rfl:    UNABLE TO FIND, Med Name: B-6 VITAMIN, Disp: , Rfl:    vitamin C (ASCORBIC ACID) 500 MG tablet, Take 500 mg by mouth daily., Disp: , Rfl:    zinc gluconate 50 MG tablet, Take 50 mg by mouth daily., Disp: , Rfl:   Social History   Tobacco Use  Smoking Status Former Smoker  Smokeless Tobacco Never Used  Tobacco Comment   Quit >50 years ago    Allergies  Allergen Reactions   Nsaids Other (See Comments) and Tinitus    Bruising    Tolmetin Other (See Comments)    Bruising   Ciprofloxacin Other (See Comments)    Other reaction(s): Other (See Comments) Achilles tendon Achilles tendon Achilles tendon Achilles  tendon   Statins Nausea And Vomiting and Other (See Comments)    Other reaction(s): Kidney issues Muscle pain Muscle pain Other reaction(s): Kidney issues Muscle pain Muscle pain Other reaction(s): Kidney issues Muscle pain Muscle pain Other reaction(s): Kidney issues Muscle pain   Requip [Ropinirole Hcl] Anxiety   Ropinirole Anxiety    Mood swing   Objective:   Vitals:   01/22/19 1336  Temp: 98.1 F (36.7 C)   There is no height or weight on file to calculate BMI. Constitutional Well developed. Well nourished.  Vascular Dorsalis pedis pulses palpable bilaterally. Posterior tibial pulses palpable bilaterally. Venous insufficiency bilateral with prominent varicosities Capillary refill normal to all digits.  No cyanosis or clubbing noted. Pedal hair growth normal.  Neurologic Normal speech. Oriented to person, place, and time. Epicritic sensation to light touch grossly diminished bilaterally.  Dermatologic Nails bilateral with thickening transverse ridging left hallux nail with evidence of subungual hemorrhage No open wounds. No skin lesions.  Orthopedic: Normal joint ROM without pain or crepitus bilaterally. No visible deformities. No bony tenderness.   Radiographs: None Assessment:   1. Pain due to onychomycosis of nail   2. Venous (peripheral) insufficiency   3. Drug-induced polyneuropathy ([place])    Plan:  Patient was evaluated and treated and all questions answered.  Onychomycosis with neuropathy -Nails palliatively debridement as below -Educated on self-care  Procedure: Nail Debridement Rationale: Pain Type of Debridement: manual, sharp debridement. Instrumentation: Nail nipper, rotary burr. Number of Nails: 2  Venous insufficiency -Educated on etiology discussed use of compression stockings   No follow-ups on file.

## 2019-01-29 ENCOUNTER — Telehealth: Payer: Self-pay | Admitting: Family Medicine

## 2019-01-29 ENCOUNTER — Encounter: Payer: Self-pay | Admitting: Family Medicine

## 2019-01-29 MED ORDER — PREDNISONE 20 MG PO TABS: ORAL_TABLET | ORAL | 0 refills | Status: DC

## 2019-01-29 NOTE — Telephone Encounter (Signed)
pls clarify with pharmacy script predniSONE (DELTASONE) 20 MG tablet  [id number] - [place], [place] - [address] AT [address] [phone] (Phone) [phone] (Fax)   pls follow up with pharmacy on verbiage of  Sig: TAKE 1 tablet daily as needed for muscle spasm. Take no more than once a week   Pharmacy assit. [place]

## 2019-01-30 MED ORDER — PREDNISONE 20 MG PO TABS: ORAL_TABLET | ORAL | 1 refills | Status: DC

## 2019-01-30 NOTE — Telephone Encounter (Signed)
Called pharmacy and clarified

## 2019-01-30 NOTE — Telephone Encounter (Signed)
Please advise 

## 2019-02-05 ENCOUNTER — Inpatient Hospital Stay: Payer: Medicare Other | Attending: Hematology & Oncology | Admitting: Hematology & Oncology

## 2019-02-05 ENCOUNTER — Encounter: Payer: Self-pay | Admitting: Hematology & Oncology

## 2019-02-05 ENCOUNTER — Inpatient Hospital Stay: Payer: Medicare Other

## 2019-02-05 ENCOUNTER — Other Ambulatory Visit: Payer: Self-pay | Admitting: Hematology & Oncology

## 2019-02-05 ENCOUNTER — Other Ambulatory Visit: Payer: Self-pay

## 2019-02-05 VITALS — BP 119/59 | HR 73 | Temp 97.1°F | Resp 19 | Wt 208.0 lb

## 2019-02-05 VITALS — BP 117/68 | HR 67 | Temp 98.0°F | Resp 20

## 2019-02-05 DIAGNOSIS — R05 Cough: Secondary | ICD-10-CM | POA: Insufficient documentation

## 2019-02-05 DIAGNOSIS — Z888 Allergy status to other drugs, medicaments and biological substances status: Secondary | ICD-10-CM | POA: Diagnosis not present

## 2019-02-05 DIAGNOSIS — C9310 Chronic myelomonocytic leukemia not having achieved remission: Secondary | ICD-10-CM

## 2019-02-05 DIAGNOSIS — Z79899 Other long term (current) drug therapy: Secondary | ICD-10-CM | POA: Diagnosis not present

## 2019-02-05 DIAGNOSIS — Z8572 Personal history of non-Hodgkin lymphomas: Secondary | ICD-10-CM | POA: Diagnosis not present

## 2019-02-05 DIAGNOSIS — C8333 Diffuse large B-cell lymphoma, intra-abdominal lymph nodes: Secondary | ICD-10-CM | POA: Diagnosis not present

## 2019-02-05 DIAGNOSIS — Z886 Allergy status to analgesic agent status: Secondary | ICD-10-CM | POA: Diagnosis not present

## 2019-02-05 DIAGNOSIS — D6481 Anemia due to antineoplastic chemotherapy: Secondary | ICD-10-CM

## 2019-02-05 DIAGNOSIS — R5383 Other fatigue: Secondary | ICD-10-CM | POA: Insufficient documentation

## 2019-02-05 DIAGNOSIS — Z881 Allergy status to other antibiotic agents status: Secondary | ICD-10-CM | POA: Diagnosis not present

## 2019-02-05 LAB — CBC WITH DIFFERENTIAL (CANCER CENTER ONLY)
Abs Immature Granulocytes: 0.06 10*3/uL (ref 0.00–0.07)
Basophils Absolute: 0 10*3/uL (ref 0.0–0.1)
Basophils Relative: 1 %
Eosinophils Absolute: 0 10*3/uL (ref 0.0–0.5)
Eosinophils Relative: 1 %
HCT: 29.7 % — ABNORMAL LOW (ref 39.0–52.0)
Hemoglobin: 9.7 g/dL — ABNORMAL LOW (ref 13.0–17.0)
Immature Granulocytes: 1 %
Lymphocytes Relative: 39 %
Lymphs Abs: 2.3 10*3/uL (ref 0.7–4.0)
MCH: 37.2 pg — ABNORMAL HIGH (ref 26.0–34.0)
MCHC: 32.7 g/dL (ref 30.0–36.0)
MCV: 113.8 fL — ABNORMAL HIGH (ref 80.0–100.0)
Monocytes Absolute: 2.6 10*3/uL — ABNORMAL HIGH (ref 0.1–1.0)
Monocytes Relative: 43 %
Neutro Abs: 0.9 10*3/uL — ABNORMAL LOW (ref 1.7–7.7)
Neutrophils Relative %: 15 %
Platelet Count: 109 10*3/uL — ABNORMAL LOW (ref 150–400)
RBC: 2.61 MIL/uL — ABNORMAL LOW (ref 4.22–5.81)
RDW: 14.6 % (ref 11.5–15.5)
WBC Count: 5.9 10*3/uL (ref 4.0–10.5)
nRBC: 0 % (ref 0.0–0.2)

## 2019-02-05 LAB — CMP (CANCER CENTER ONLY)
ALT: 13 U/L (ref 0–44)
AST: 14 U/L — ABNORMAL LOW (ref 15–41)
Albumin: 3.7 g/dL (ref 3.5–5.0)
Alkaline Phosphatase: 51 U/L (ref 38–126)
Anion gap: 7 (ref 5–15)
BUN: 28 mg/dL — ABNORMAL HIGH (ref 8–23)
CO2: 29 mmol/L (ref 22–32)
Calcium: 8.8 mg/dL — ABNORMAL LOW (ref 8.9–10.3)
Chloride: 103 mmol/L (ref 98–111)
Creatinine: 1.43 mg/dL — ABNORMAL HIGH (ref 0.61–1.24)
GFR, Est AFR Am: 50 mL/min — ABNORMAL LOW (ref >60)
GFR, Estimated: 43 mL/min — ABNORMAL LOW (ref >60)
Glucose, Bld: 97 mg/dL (ref 70–99)
Potassium: 4.2 mmol/L (ref 3.5–5.1)
Sodium: 139 mmol/L (ref 135–145)
Total Bilirubin: 0.9 mg/dL (ref 0.3–1.2)
Total Protein: 6.3 g/dL — ABNORMAL LOW (ref 6.5–8.1)

## 2019-02-05 LAB — SAVE SMEAR(SSMR), FOR PROVIDER SLIDE REVIEW

## 2019-02-05 MED ORDER — DARBEPOETIN ALFA 300 MCG/0.6ML IJ SOSY
PREFILLED_SYRINGE | INTRAMUSCULAR | Status: AC
  Filled 2019-02-05: qty 0.6

## 2019-02-05 MED ORDER — DARBEPOETIN ALFA 300 MCG/0.6ML IJ SOSY
300.0000 ug | PREFILLED_SYRINGE | Freq: Once | INTRAMUSCULAR | Status: AC
  Administered 2019-02-05: 13:00:00 300 ug via SUBCUTANEOUS

## 2019-02-05 NOTE — Progress Notes (Signed)
Hematology and Oncology Follow Up Visit  Jeremiah Walker [id number] Dec 24, 1927 85 y.o. 02/05/2019   Principle Diagnosis:    History of diffuse large cell non-Hodgkin's lymphoma-treated in [place]  Chronic Myelomonocytic Leukemia (CMMoL) - Normal cytogenetics  Current Therapy:    Aranesp 300 mcg sq q 3 weeks for Hgb < 11     Interim History:  Jeremiah Walker is back for follow-up.  He looks pretty good.  He feels quite well.  He had a wonderful time up in [place].  He has a [place] up in [place].  The whole family goes up there every summer.  They are up there for 2 or 3 weeks.  He really enjoys it.  He is starting to do more exercising down here.  He is doing some aquatic exercising.  He has had no problems with bleeding.  He does have some bruising.  I told that he may have some of platelet dysfunction.  Even though his platelet count is okay, I think that he may have some platelet dysfunction activity causes him bruising.  He has had no fever.  He has had no cough.  Overall, his performance status is ECOG 1.  Medications:  Current Outpatient Medications:  .  allopurinol (ZYLOPRIM) 300 MG tablet, Take 1 tablet (300 mg total) by mouth daily., Disp: 90 tablet, Rfl: 3 .  ALPRAZolam (XANAX) 0.5 MG tablet, TAKE 1 TABLET(0.5 MG) BY MOUTH THREE TIMES DAILY AS NEEDED FOR ANXIETY, Disp: 90 tablet, Rfl: 3 .  amoxicillin (AMOXIL) 500 MG capsule, Take 4 caps (2gm) by mouth once prior to dental procedure, Disp: 20 capsule, Rfl: 0 .  azelastine (ASTELIN) 0.1 % nasal spray, Place 2 sprays into both nostrils 2 (two) times daily. Use in each nostril as directed, Disp: 30 mL, Rfl: 11 .  b complex vitamins tablet, Take 1 tablet by mouth daily., Disp: , Rfl:  .  bethanechol (URECHOLINE) 25 MG tablet, Take 1 tablet (25 mg total) by mouth 3 (three) times daily., Disp: 90 tablet, Rfl: 11 .  Calcium 500 MG tablet, Take 600 mg by mouth daily., Disp: , Rfl:  .  Cholecalciferol (VITAMIN D3) 2000  UNITS capsule, Take 2,000 Units by mouth daily., Disp: , Rfl:  .  Darbepoetin Alfa (ARANESP, ALBUMIN FREE, IJ), Inject as directed., Disp: , Rfl:  .  folic acid (FOLVITE) 1 MG tablet, TAKE 2 TABLETS(2 MG) BY MOUTH DAILY, Disp: 60 tablet, Rfl: 4 .  gabapentin (NEURONTIN) 300 MG capsule, TAKE 1 CAPSULES BY MOUTH THREE TIMES DAILY, PLUS 2 CAPSULES EVERY DAY AS NEEDED, Disp: 150 capsule, Rfl: 5 .  HYDROcodone-acetaminophen (NORCO) 7.5-325 MG tablet, Take 1 tablet by mouth every 6 (six) hours as needed for moderate pain., Disp: 100 tablet, Rfl: 0 .  Magnesium Oxide 200 MG TABS, Take by mouth., Disp: , Rfl:  .  Multiple Vitamin (MULTIVITAMIN) tablet, Take 1 tablet by mouth daily., Disp: , Rfl:  .  Omega-3 Fatty Acids (OMEGA-3 FISH OIL PO), Take 2 each by mouth every morning. EPA/DHA 520/350, Disp: , Rfl:  .  polyethylene glycol (MIRALAX / GLYCOLAX) packet, Take 17 g by mouth daily as needed., Disp: , Rfl:  .  predniSONE (DELTASONE) 20 MG tablet, May take 1 tablet daily for up to 3 days as needed for gout attack or muscle spasm, Disp: 20 tablet, Rfl: 1 .  psyllium (METAMUCIL) 58.6 % powder, Take 1 packet by mouth daily. 1 Teaspoon daily, Disp: , Rfl:  .  UNABLE TO FIND, Med  Name: B-6 VITAMIN, Disp: , Rfl:  .  vitamin C (ASCORBIC ACID) 500 MG tablet, Take 500 mg by mouth daily., Disp: , Rfl:  .  zinc gluconate 50 MG tablet, Take 50 mg by mouth daily., Disp: , Rfl:  No current facility-administered medications for this visit.   Facility-Administered Medications Ordered in Other Visits:  .  Darbepoetin Alfa (ARANESP) injection 300 mcg, 300 mcg, Subcutaneous, Once, Priyana Mccarey, Rudell Cobb, MD  Allergies:  Allergies  Allergen Reactions  . Nsaids Other (See Comments) and Tinitus    Bruising   . Tolmetin Other (See Comments)    Bruising  . Ciprofloxacin Other (See Comments)    Other reaction(s): Other (See Comments) Achilles tendon Achilles tendon Achilles tendon Achilles tendon  . Statins Nausea And  Vomiting and Other (See Comments)    Other reaction(s): Kidney issues Muscle pain Muscle pain Other reaction(s): Kidney issues Muscle pain Muscle pain Other reaction(s): Kidney issues Muscle pain Muscle pain Other reaction(s): Kidney issues Muscle pain  . Requip [Ropinirole Hcl] Anxiety  . Ropinirole Anxiety    Mood swing    Past Medical History, Surgical history, Social history, and Family History were reviewed and updated.  Review of Systems: Review of Systems  Constitutional: Positive for malaise/fatigue.  HENT: Negative.   Eyes: Negative.   Respiratory: Positive for cough.   Cardiovascular: Negative.   Gastrointestinal: Negative.   Genitourinary: Negative.   Musculoskeletal: Negative.   Skin: Negative.   Neurological: Negative.   Endo/Heme/Allergies: Negative.   Psychiatric/Behavioral: Negative.      Physical Exam:  weight is 208 lb (94.3 kg). His oral temperature is 97.1 F (36.2 C) (abnormal). His blood pressure is 119/59 (abnormal) and his pulse is 73. His respiration is 19 and oxygen saturation is 99%.   Wt Readings from Last 3 Encounters:  02/05/19 208 lb (94.3 kg)  12/29/18 210 lb (95.3 kg)  12/24/18 213 lb 8 oz (96.8 kg)     Physical Exam Vitals signs reviewed.  HENT:     Head: Normocephalic and atraumatic.  Eyes:     Pupils: Pupils are equal, round, and reactive to light.  Neck:     Musculoskeletal: Normal range of motion.  Cardiovascular:     Rate and Rhythm: Normal rate and regular rhythm.     Heart sounds: Murmur present.     Comments: In listening to his cardiac exam, he has a 2/6 systolic ejection murmur. Pulmonary:     Effort: Pulmonary effort is normal.     Breath sounds: Normal breath sounds.  Abdominal:     General: Bowel sounds are normal.     Palpations: Abdomen is soft.  Musculoskeletal: Normal range of motion.        General: No tenderness or deformity.  Lymphadenopathy:     Cervical: No cervical adenopathy.  Skin:     General: Skin is warm and dry.     Findings: No erythema or rash.  Neurological:     Mental Status: He is alert and oriented to person, place, and time.  Psychiatric:        Behavior: Behavior normal.        Thought Content: Thought content normal.        Judgment: Judgment normal.     Lab Results  Component Value Date   WBC 5.9 02/05/2019   HGB 9.7 (L) 02/05/2019   HCT 29.7 (L) 02/05/2019   MCV 113.8 (H) 02/05/2019   PLT 109 (L) 02/05/2019     Chemistry  Component Value Date/Time   NA 139 02/05/2019 1126   NA 139 05/08/2017 1123   NA 141 05/15/2016 1126   K 4.2 02/05/2019 1126   K 4.0 05/08/2017 1123   K 4.3 05/15/2016 1126   CL 103 02/05/2019 1126   CL 105 05/08/2017 1123   CO2 29 02/05/2019 1126   CO2 30 05/08/2017 1123   CO2 26 05/15/2016 1126   BUN 28 (H) 02/05/2019 1126   BUN 15 05/08/2017 1123   BUN 29.1 (H) 05/15/2016 1126   CREATININE 1.43 (H) 02/05/2019 1126   CREATININE 1.2 05/08/2017 1123   CREATININE 1.7 (H) 05/15/2016 1126   GLU 90 11/15/2014      Component Value Date/Time   CALCIUM 8.8 (L) 02/05/2019 1126   CALCIUM 9.1 05/08/2017 1123   CALCIUM 9.7 05/15/2016 1126   ALKPHOS 51 02/05/2019 1126   ALKPHOS 64 05/08/2017 1123   ALKPHOS 69 05/15/2016 1126   AST 14 (L) 02/05/2019 1126   AST 23 05/15/2016 1126   ALT 13 02/05/2019 1126   ALT 19 05/08/2017 1123   ALT 22 05/15/2016 1126   BILITOT 0.9 02/05/2019 1126   BILITOT 0.84 05/15/2016 1126         Impression and Plan: Jeremiah Walker is a 85 year old white male. He had a history of diffuse large cell non-Hodgkin's lymphoma. He was treated  with 6 cycles of R-CHOP. He got this treatment up in [place]. He completed this back in April 2014.   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 This could be a residual from his chemotherapy for his lymphoma.  We will go ahead and give him a dose of Aranesp today.  I am a little bit surprised that he did not have a bigger rise in his hemoglobin with  the Aranesp we gave him a month ago.  Thankfully, his quality of life is still doing quite well.  We will see him back in another 3 weeks.  I just want to make sure that we follow him closely so that we continue to his white count where it needs to be.     [name], MD 8/13/202012:35 PM

## 2019-02-06 ENCOUNTER — Telehealth: Payer: Self-pay | Admitting: Hematology & Oncology

## 2019-02-06 LAB — IRON AND TIBC
Iron: 129 ug/dL (ref 42–163)
Saturation Ratios: 57 % — ABNORMAL HIGH (ref 20–55)
TIBC: 226 ug/dL (ref 202–409)
UIBC: 97 ug/dL — ABNORMAL LOW (ref 117–376)

## 2019-02-06 LAB — FERRITIN: Ferritin: 360 ng/mL — ABNORMAL HIGH (ref 24–336)

## 2019-02-06 NOTE — Telephone Encounter (Signed)
Appointments scheduled [facility] & mailed calendar/letter per 8/13 los

## 2019-02-17 ENCOUNTER — Encounter: Payer: Self-pay | Admitting: Family Medicine

## 2019-02-17 DIAGNOSIS — G894 Chronic pain syndrome: Secondary | ICD-10-CM

## 2019-02-17 MED ORDER — HYDROCODONE-ACETAMINOPHEN 7.5-325 MG PO TABS: 1.0000 | ORAL_TABLET | Freq: Four times a day (QID) | ORAL | 0 refills | Status: DC | PRN

## 2019-02-18 ENCOUNTER — Encounter: Payer: Self-pay | Admitting: Family Medicine

## 2019-02-18 DIAGNOSIS — G6289 Other specified polyneuropathies: Secondary | ICD-10-CM

## 2019-02-20 MED ORDER — GABAPENTIN 300 MG PO CAPS: ORAL_CAPSULE | ORAL | 5 refills | Status: DC

## 2019-02-21 DIAGNOSIS — Z23 Encounter for immunization: Secondary | ICD-10-CM | POA: Diagnosis not present

## 2019-02-24 ENCOUNTER — Encounter: Payer: Self-pay | Admitting: Hematology & Oncology

## 2019-02-26 ENCOUNTER — Inpatient Hospital Stay: Payer: Medicare Other | Attending: Hematology & Oncology

## 2019-02-26 ENCOUNTER — Inpatient Hospital Stay (HOSPITAL_BASED_OUTPATIENT_CLINIC_OR_DEPARTMENT_OTHER): Payer: Medicare Other | Admitting: Family

## 2019-02-26 ENCOUNTER — Other Ambulatory Visit: Payer: Self-pay

## 2019-02-26 ENCOUNTER — Inpatient Hospital Stay: Payer: Medicare Other

## 2019-02-26 ENCOUNTER — Telehealth: Payer: Self-pay | Admitting: Hematology & Oncology

## 2019-02-26 ENCOUNTER — Encounter: Payer: Self-pay | Admitting: Family Medicine

## 2019-02-26 VITALS — BP 142/69 | HR 76 | Temp 98.2°F | Resp 17 | Wt 211.0 lb

## 2019-02-26 DIAGNOSIS — T451X5A Adverse effect of antineoplastic and immunosuppressive drugs, initial encounter: Secondary | ICD-10-CM

## 2019-02-26 DIAGNOSIS — G8929 Other chronic pain: Secondary | ICD-10-CM | POA: Insufficient documentation

## 2019-02-26 DIAGNOSIS — D6481 Anemia due to antineoplastic chemotherapy: Secondary | ICD-10-CM

## 2019-02-26 DIAGNOSIS — N183 Chronic kidney disease, stage 3 (moderate): Secondary | ICD-10-CM | POA: Insufficient documentation

## 2019-02-26 DIAGNOSIS — G4700 Insomnia, unspecified: Secondary | ICD-10-CM

## 2019-02-26 DIAGNOSIS — G2581 Restless legs syndrome: Secondary | ICD-10-CM

## 2019-02-26 DIAGNOSIS — R5383 Other fatigue: Secondary | ICD-10-CM | POA: Diagnosis not present

## 2019-02-26 DIAGNOSIS — Z888 Allergy status to other drugs, medicaments and biological substances status: Secondary | ICD-10-CM | POA: Diagnosis not present

## 2019-02-26 DIAGNOSIS — G629 Polyneuropathy, unspecified: Secondary | ICD-10-CM | POA: Diagnosis not present

## 2019-02-26 DIAGNOSIS — Z881 Allergy status to other antibiotic agents status: Secondary | ICD-10-CM | POA: Insufficient documentation

## 2019-02-26 DIAGNOSIS — Z79899 Other long term (current) drug therapy: Secondary | ICD-10-CM | POA: Diagnosis not present

## 2019-02-26 DIAGNOSIS — D696 Thrombocytopenia, unspecified: Secondary | ICD-10-CM | POA: Diagnosis not present

## 2019-02-26 DIAGNOSIS — M549 Dorsalgia, unspecified: Secondary | ICD-10-CM | POA: Diagnosis not present

## 2019-02-26 DIAGNOSIS — Z886 Allergy status to analgesic agent status: Secondary | ICD-10-CM | POA: Diagnosis not present

## 2019-02-26 DIAGNOSIS — C9310 Chronic myelomonocytic leukemia not having achieved remission: Secondary | ICD-10-CM

## 2019-02-26 DIAGNOSIS — Z8572 Personal history of non-Hodgkin lymphomas: Secondary | ICD-10-CM | POA: Diagnosis not present

## 2019-02-26 DIAGNOSIS — C8333 Diffuse large B-cell lymphoma, intra-abdominal lymph nodes: Secondary | ICD-10-CM

## 2019-02-26 LAB — CMP (CANCER CENTER ONLY)
ALT: 11 U/L (ref 0–44)
AST: 15 U/L (ref 15–41)
Albumin: 3.9 g/dL (ref 3.5–5.0)
Alkaline Phosphatase: 50 U/L (ref 38–126)
Anion gap: 7 (ref 5–15)
BUN: 21 mg/dL (ref 8–23)
CO2: 29 mmol/L (ref 22–32)
Calcium: 9.3 mg/dL (ref 8.9–10.3)
Chloride: 105 mmol/L (ref 98–111)
Creatinine: 1.54 mg/dL — ABNORMAL HIGH (ref 0.61–1.24)
GFR, Est AFR Am: 46 mL/min — ABNORMAL LOW (ref >60)
GFR, Estimated: 39 mL/min — ABNORMAL LOW (ref >60)
Glucose, Bld: 105 mg/dL — ABNORMAL HIGH (ref 70–99)
Potassium: 4.5 mmol/L (ref 3.5–5.1)
Sodium: 141 mmol/L (ref 135–145)
Total Bilirubin: 0.8 mg/dL (ref 0.3–1.2)
Total Protein: 6.8 g/dL (ref 6.5–8.1)

## 2019-02-26 LAB — CBC WITH DIFFERENTIAL (CANCER CENTER ONLY)
Abs Immature Granulocytes: 0.03 10*3/uL (ref 0.00–0.07)
Basophils Absolute: 0 10*3/uL (ref 0.0–0.1)
Basophils Relative: 0 %
Eosinophils Absolute: 0.1 10*3/uL (ref 0.0–0.5)
Eosinophils Relative: 1 %
HCT: 31 % — ABNORMAL LOW (ref 39.0–52.0)
Hemoglobin: 9.9 g/dL — ABNORMAL LOW (ref 13.0–17.0)
Immature Granulocytes: 1 %
Lymphocytes Relative: 24 %
Lymphs Abs: 1.4 10*3/uL (ref 0.7–4.0)
MCH: 37.4 pg — ABNORMAL HIGH (ref 26.0–34.0)
MCHC: 31.9 g/dL (ref 30.0–36.0)
MCV: 117 fL — ABNORMAL HIGH (ref 80.0–100.0)
Monocytes Absolute: 2.6 10*3/uL — ABNORMAL HIGH (ref 0.1–1.0)
Monocytes Relative: 47 %
Neutro Abs: 1.5 10*3/uL — ABNORMAL LOW (ref 1.7–7.7)
Neutrophils Relative %: 27 %
Platelet Count: 73 10*3/uL — ABNORMAL LOW (ref 150–400)
RBC: 2.65 MIL/uL — ABNORMAL LOW (ref 4.22–5.81)
RDW: 15.3 % (ref 11.5–15.5)
WBC Count: 5.7 10*3/uL (ref 4.0–10.5)
nRBC: 0 % (ref 0.0–0.2)

## 2019-02-26 LAB — RETICULOCYTES
Immature Retic Fract: 16 % — ABNORMAL HIGH (ref 2.3–15.9)
RBC.: 2.63 MIL/uL — ABNORMAL LOW (ref 4.22–5.81)
Retic Count, Absolute: 48.7 10*3/uL (ref 19.0–186.0)
Retic Ct Pct: 1.9 % (ref 0.4–3.1)

## 2019-02-26 MED ORDER — DARBEPOETIN ALFA 300 MCG/0.6ML IJ SOSY
300.0000 ug | PREFILLED_SYRINGE | Freq: Once | INTRAMUSCULAR | Status: AC
  Administered 2019-02-26: 13:00:00 300 ug via SUBCUTANEOUS

## 2019-02-26 MED ORDER — ALPRAZOLAM 0.5 MG PO TABS: ORAL_TABLET | ORAL | 3 refills | Status: DC

## 2019-02-26 NOTE — Progress Notes (Signed)
Hematology and Oncology Follow Up Visit  Jeremiah Walker [id number] January 24, 1928 85 y.o. 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 He is doing well but having issues with chronic back pain. He is ambulating with a rolling, seat walker for added support.  He has some fatigue at times but stays busy. He enjoys swimming and working out at [employer].  His Hgb today is improved at 9.9 and platelets are 74.  He not had any episodes of bleeding. His skin is thin on his arms and he does bruise easily. No petechiae.  No fever, chills, n/v, cough, rash, dizziness, SOB, chest pain, palpitations, abdominal pain or changes in bowel or bladder habits.  He feels that the neuropathy in his feet has gotten worse despite taking his Neurontin daily as prescribed. He was asking about switching to Lyrica. His Neurontin is prescribed by Dr. Edilia Bo so I have messaged her to let her know about his request. With his age and stage 3 chronic kidney disease, I'm not sure he would tolerate Lyrica well.  No swelling in his extremities at this time.  He has maintained a good appetite and is staying well hydrated. His weight is stable.   ECOG Performance Status: 1 - Symptomatic but completely ambulatory  Medications:  Allergies as of 02/26/2019      Reactions   Nsaids Other (See Comments), Tinitus   Bruising   Tolmetin Other (See Comments)   Bruising   Ciprofloxacin Other (See Comments)   Other reaction(s): Other (See Comments) Achilles tendon Achilles tendon Achilles tendon Achilles tendon   Statins Nausea And Vomiting, Other (See Comments)   Other reaction(s): Kidney issues Muscle pain Muscle pain Other reaction(s): Kidney issues Muscle pain Muscle pain  Other reaction(s): Kidney issues Muscle pain Muscle pain Other reaction(s): Kidney issues Muscle pain   Requip [ropinirole Hcl] Anxiety   Ropinirole Anxiety   Mood swing      Medication List       Accurate as of February 26, 2019 12:37 PM. If you have any questions, ask your nurse or doctor.        allopurinol 300 MG tablet Commonly known as: ZYLOPRIM Take 1 tablet (300 mg total) by mouth daily.   ALPRAZolam 0.5 MG tablet Commonly known as: XANAX TAKE 1 TABLET(0.5 MG) BY MOUTH THREE TIMES DAILY AS NEEDED FOR ANXIETY   amoxicillin 500 MG capsule Commonly known as: AMOXIL Take 4 caps (2gm) by mouth once prior to dental procedure   ARANESP (ALBUMIN FREE) IJ Inject as directed.   azelastine 0.1 % nasal spray Commonly known as: ASTELIN Place 2 sprays into both nostrils 2 (two) times daily. Use in each nostril as directed   b complex vitamins tablet Take 1 tablet by mouth daily.   bethanechol 25 MG tablet Commonly known as: URECHOLINE Take 1 tablet (25 mg total) by mouth 3 (three) times daily.   Calcium 500 MG tablet Take 600 mg by mouth daily.   folic acid 1 MG tablet Commonly known as: FOLVITE TAKE 2 TABLETS(2 MG) BY MOUTH DAILY   gabapentin 300 MG capsule Commonly known as: NEURONTIN TAKE 1 CAPSULES BY MOUTH THREE TIMES DAILY, PLUS 2 CAPSULES EVERY DAY AS NEEDED   HYDROcodone-acetaminophen 7.5-325 MG tablet Commonly known as: NORCO  Take 1 tablet by mouth every 6 (six) hours as needed for moderate pain.   Magnesium Oxide 200 MG Tabs Take by mouth.   multivitamin tablet Take 1 tablet by mouth daily.   OMEGA-3 FISH OIL PO Take 2 each by mouth every morning. EPA/DHA 520/350   polyethylene glycol 17 g packet Commonly known as: MIRALAX / GLYCOLAX Take 17 g by mouth daily as needed.   predniSONE 20 MG tablet Commonly known as: DELTASONE May take 1 tablet daily for up to 3 days as needed for gout attack or muscle spasm   psyllium 58.6 % powder  Commonly known as: METAMUCIL Take 1 packet by mouth daily. 1 Teaspoon daily   UNABLE TO FIND Med Name: B-6 VITAMIN   vitamin C 500 MG tablet Commonly known as: ASCORBIC ACID Take 500 mg by mouth daily.   Vitamin D3 50 MCG (2000 UT) capsule Take 2,000 Units by mouth daily.   zinc gluconate 50 MG tablet Take 50 mg by mouth daily.       Allergies:  Allergies  Allergen Reactions  . Nsaids Other (See Comments) and Tinitus    Bruising   . Tolmetin Other (See Comments)    Bruising  . Ciprofloxacin Other (See Comments)    Other reaction(s): Other (See Comments) Achilles tendon Achilles tendon Achilles tendon Achilles tendon  . Statins Nausea And Vomiting and Other (See Comments)    Other reaction(s): Kidney issues Muscle pain Muscle pain Other reaction(s): Kidney issues Muscle pain Muscle pain Other reaction(s): Kidney issues Muscle pain Muscle pain Other reaction(s): Kidney issues Muscle pain  . Requip [Ropinirole Hcl] Anxiety  . Ropinirole Anxiety    Mood swing    Past Medical History, Surgical history, Social history, and Family History were reviewed and updated.  Review of Systems: All other 10 point review of systems is negative.   Physical Exam:  vitals were not taken for this visit.   Wt Readings from Last 3 Encounters:  02/05/19 208 lb (94.3 kg)  12/29/18 210 lb (95.3 kg)  12/24/18 213 lb 8 oz (96.8 kg)    Ocular: Sclerae unicteric, pupils equal, round and reactive to light Ear-nose-throat: Oropharynx clear, dentition fair Lymphatic: No cervical or supraclavicular adenopathy Lungs no rales or rhonchi, good excursion bilaterally Heart regular rate and rhythm, no murmur appreciated Abd soft, nontender, positive bowel sounds, no liver or spleen tip palpated on exam, no fluid wave  MSK no focal spinal tenderness, no joint edema Neuro: non-focal, well-oriented, appropriate affect Breasts: Deferred   Lab Results  Component Value Date   WBC 5.7  02/26/2019   HGB 9.9 (L) 02/26/2019   HCT 31.0 (L) 02/26/2019   MCV 117.0 (H) 02/26/2019   PLT 73 (L) 02/26/2019   Lab Results  Component Value Date   FERRITIN 360 (H) 02/05/2019   IRON 129 02/05/2019   TIBC 226 02/05/2019   UIBC 97 (L) 02/05/2019   IRONPCTSAT 57 (H) 02/05/2019   Lab Results  Component Value Date   RETICCTPCT 1.9 02/26/2019   RBC 2.65 (L) 02/26/2019   RBC 2.63 (L) 02/26/2019   No results found for: KPAFRELGTCHN, LAMBDASER, KAPLAMBRATIO No results found for: IGGSERUM, IGA, IGMSERUM No results found for: Kathrynn Ducking MSPIKE, SPEI   Chemistry      Component Value Date/Time   NA 141 02/26/2019 1159   NA 139 05/08/2017 1123   NA 141 05/15/2016 1126   K 4.5 02/26/2019 1159   K 4.0 05/08/2017 1123  K 4.3 05/15/2016 1126   CL 105 02/26/2019 1159   CL 105 05/08/2017 1123   CO2 29 02/26/2019 1159   CO2 30 05/08/2017 1123   CO2 26 05/15/2016 1126   BUN 21 02/26/2019 1159   BUN 15 05/08/2017 1123   BUN 29.1 (H) 05/15/2016 1126   CREATININE 1.54 (H) 02/26/2019 1159   CREATININE 1.2 05/08/2017 1123   CREATININE 1.7 (H) 05/15/2016 1126   GLU 90 11/15/2014      Component Value Date/Time   CALCIUM 9.3 02/26/2019 1159   CALCIUM 9.1 05/08/2017 1123   CALCIUM 9.7 05/15/2016 1126   ALKPHOS 50 02/26/2019 1159   ALKPHOS 64 05/08/2017 1123   ALKPHOS 69 05/15/2016 1126   AST 15 02/26/2019 1159   AST 23 05/15/2016 1126   ALT 11 02/26/2019 1159   ALT 19 05/08/2017 1123   ALT 22 05/15/2016 1126   BILITOT 0.8 02/26/2019 1159   BILITOT 0.84 05/15/2016 1126       Impression and Plan: Jeremiah Walker is a 85 yo caucasain gentleman with history of diffuse large cell non-Hodgkin's lymphoma and completed 6 cycles of R-CHOP in [place] in April 2014.  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  He received Aranesp today for Hgb 9.9.  I spoke with Dr. Maylon Peppers regarding the platelet count and for  now we will just continue to monitor.  We will see him back in another 3-4 weeks.  He will contact our office with any questions or concerns. He will let us know if he notes petechiae or any bleeding. We can certainly see him sooner if needed.   Laverna Peace, NP 9/3/202012:37 PM

## 2019-02-26 NOTE — Telephone Encounter (Signed)
lmom to inform patient of appt 10/1 at 1 pm per 9/3 LOS

## 2019-02-27 ENCOUNTER — Encounter: Payer: Self-pay | Admitting: Family Medicine

## 2019-02-27 DIAGNOSIS — G6289 Other specified polyneuropathies: Secondary | ICD-10-CM

## 2019-02-27 LAB — FERRITIN: Ferritin: 291 ng/mL (ref 24–336)

## 2019-02-27 LAB — IRON AND TIBC
Iron: 91 ug/dL (ref 42–163)
Saturation Ratios: 39 % (ref 20–55)
TIBC: 235 ug/dL (ref 202–409)
UIBC: 144 ug/dL (ref 117–376)

## 2019-03-04 ENCOUNTER — Telehealth: Payer: Self-pay

## 2019-03-04 MED ORDER — PREGABALIN 25 MG PO CAPS: 25.0000 mg | ORAL_CAPSULE | Freq: Three times a day (TID) | ORAL | 3 refills | Status: DC

## 2019-03-04 NOTE — Telephone Encounter (Signed)
Creat clearance is approx 43 Usual dose of lyrica would be 150/d, will dose at 75 mg a day or 3x 25 mg daily

## 2019-03-04 NOTE — Telephone Encounter (Signed)
PA approved. Effective 12/04/2018 to 06/25/2019.

## 2019-03-04 NOTE — Telephone Encounter (Signed)
PA initiated via Covermymeds; KEY: [id number]. Awaiting determination.

## 2019-03-23 ENCOUNTER — Encounter: Payer: Self-pay | Admitting: Family Medicine

## 2019-03-23 ENCOUNTER — Other Ambulatory Visit: Payer: Self-pay | Admitting: Family Medicine

## 2019-03-23 DIAGNOSIS — G894 Chronic pain syndrome: Secondary | ICD-10-CM

## 2019-03-23 MED ORDER — HYDROCODONE-ACETAMINOPHEN 7.5-325 MG PO TABS: 1.0000 | ORAL_TABLET | Freq: Four times a day (QID) | ORAL | 0 refills | Status: DC | PRN

## 2019-03-23 NOTE — Telephone Encounter (Signed)
Requesting:Norco Contract:none, needs CSC UDS:12/10/17 needs UDS Last Visit:12/29/2018 Next Visit:none scheduled with pcp Last Refill:02/17/19   Please Advise

## 2019-03-24 ENCOUNTER — Encounter: Payer: Self-pay | Admitting: Family Medicine

## 2019-03-24 DIAGNOSIS — G894 Chronic pain syndrome: Secondary | ICD-10-CM

## 2019-03-24 NOTE — Telephone Encounter (Signed)
Dr. Lorelei Pont can you put in the lab orders for his urine?

## 2019-03-25 DIAGNOSIS — R0789 Other chest pain: Secondary | ICD-10-CM | POA: Diagnosis not present

## 2019-03-25 DIAGNOSIS — R079 Chest pain, unspecified: Secondary | ICD-10-CM | POA: Diagnosis not present

## 2019-03-25 DIAGNOSIS — I959 Hypotension, unspecified: Secondary | ICD-10-CM | POA: Diagnosis not present

## 2019-03-25 DIAGNOSIS — Z20828 Contact with and (suspected) exposure to other viral communicable diseases: Secondary | ICD-10-CM | POA: Diagnosis not present

## 2019-03-26 ENCOUNTER — Ambulatory Visit: Payer: Medicare Other | Admitting: Hematology & Oncology

## 2019-03-26 ENCOUNTER — Emergency Department (HOSPITAL_COMMUNITY): Payer: Medicare Other

## 2019-03-26 ENCOUNTER — Ambulatory Visit: Payer: Medicare Other

## 2019-03-26 ENCOUNTER — Observation Stay (HOSPITAL_COMMUNITY)
Admission: EM | Admit: 2019-03-26 | Discharge: 2019-03-27 | Disposition: A | Discharge status: Home / Self Care | Payer: Medicare Other | Attending: Internal Medicine | Admitting: Internal Medicine

## 2019-03-26 ENCOUNTER — Observation Stay (HOSPITAL_BASED_OUTPATIENT_CLINIC_OR_DEPARTMENT_OTHER): Payer: Medicare Other

## 2019-03-26 ENCOUNTER — Other Ambulatory Visit: Payer: Self-pay

## 2019-03-26 ENCOUNTER — Other Ambulatory Visit: Payer: Medicare Other

## 2019-03-26 ENCOUNTER — Encounter (HOSPITAL_COMMUNITY): Payer: Self-pay | Admitting: Radiology

## 2019-03-26 DIAGNOSIS — R072 Precordial pain: Secondary | ICD-10-CM | POA: Diagnosis not present

## 2019-03-26 DIAGNOSIS — F419 Anxiety disorder, unspecified: Secondary | ICD-10-CM | POA: Diagnosis not present

## 2019-03-26 DIAGNOSIS — R7989 Other specified abnormal findings of blood chemistry: Secondary | ICD-10-CM | POA: Diagnosis not present

## 2019-03-26 DIAGNOSIS — D631 Anemia in chronic kidney disease: Secondary | ICD-10-CM | POA: Insufficient documentation

## 2019-03-26 DIAGNOSIS — M546 Pain in thoracic spine: Secondary | ICD-10-CM | POA: Insufficient documentation

## 2019-03-26 DIAGNOSIS — Z886 Allergy status to analgesic agent status: Secondary | ICD-10-CM | POA: Insufficient documentation

## 2019-03-26 DIAGNOSIS — E782 Mixed hyperlipidemia: Secondary | ICD-10-CM

## 2019-03-26 DIAGNOSIS — Z66 Do not resuscitate: Secondary | ICD-10-CM | POA: Insufficient documentation

## 2019-03-26 DIAGNOSIS — E785 Hyperlipidemia, unspecified: Secondary | ICD-10-CM | POA: Diagnosis not present

## 2019-03-26 DIAGNOSIS — Z87891 Personal history of nicotine dependence: Secondary | ICD-10-CM | POA: Insufficient documentation

## 2019-03-26 DIAGNOSIS — D696 Thrombocytopenia, unspecified: Secondary | ICD-10-CM | POA: Diagnosis not present

## 2019-03-26 DIAGNOSIS — R739 Hyperglycemia, unspecified: Secondary | ICD-10-CM | POA: Diagnosis not present

## 2019-03-26 DIAGNOSIS — N183 Chronic kidney disease, stage 3 unspecified: Secondary | ICD-10-CM | POA: Insufficient documentation

## 2019-03-26 DIAGNOSIS — Z79899 Other long term (current) drug therapy: Secondary | ICD-10-CM | POA: Insufficient documentation

## 2019-03-26 DIAGNOSIS — I350 Nonrheumatic aortic (valve) stenosis: Secondary | ICD-10-CM | POA: Diagnosis not present

## 2019-03-26 DIAGNOSIS — I340 Nonrheumatic mitral (valve) insufficiency: Secondary | ICD-10-CM | POA: Diagnosis not present

## 2019-03-26 DIAGNOSIS — Z881 Allergy status to other antibiotic agents status: Secondary | ICD-10-CM | POA: Diagnosis not present

## 2019-03-26 DIAGNOSIS — Z96643 Presence of artificial hip joint, bilateral: Secondary | ICD-10-CM | POA: Diagnosis not present

## 2019-03-26 DIAGNOSIS — Z888 Allergy status to other drugs, medicaments and biological substances status: Secondary | ICD-10-CM | POA: Diagnosis not present

## 2019-03-26 DIAGNOSIS — K219 Gastro-esophageal reflux disease without esophagitis: Secondary | ICD-10-CM | POA: Insufficient documentation

## 2019-03-26 DIAGNOSIS — C9310 Chronic myelomonocytic leukemia not having achieved remission: Secondary | ICD-10-CM

## 2019-03-26 DIAGNOSIS — R079 Chest pain, unspecified: Secondary | ICD-10-CM | POA: Diagnosis present

## 2019-03-26 DIAGNOSIS — I252 Old myocardial infarction: Secondary | ICD-10-CM | POA: Insufficient documentation

## 2019-03-26 DIAGNOSIS — G894 Chronic pain syndrome: Secondary | ICD-10-CM | POA: Insufficient documentation

## 2019-03-26 DIAGNOSIS — I2510 Atherosclerotic heart disease of native coronary artery without angina pectoris: Secondary | ICD-10-CM | POA: Diagnosis not present

## 2019-03-26 DIAGNOSIS — Z20828 Contact with and (suspected) exposure to other viral communicable diseases: Secondary | ICD-10-CM | POA: Insufficient documentation

## 2019-03-26 DIAGNOSIS — Z8572 Personal history of non-Hodgkin lymphomas: Secondary | ICD-10-CM | POA: Insufficient documentation

## 2019-03-26 DIAGNOSIS — C9210 Chronic myeloid leukemia, BCR/ABL-positive, not having achieved remission: Secondary | ICD-10-CM | POA: Insufficient documentation

## 2019-03-26 DIAGNOSIS — F411 Generalized anxiety disorder: Secondary | ICD-10-CM

## 2019-03-26 DIAGNOSIS — M1990 Unspecified osteoarthritis, unspecified site: Secondary | ICD-10-CM | POA: Insufficient documentation

## 2019-03-26 DIAGNOSIS — I1310 Hypertensive heart and chronic kidney disease without heart failure, with stage 1 through stage 4 chronic kidney disease, or unspecified chronic kidney disease: Secondary | ICD-10-CM | POA: Diagnosis not present

## 2019-03-26 DIAGNOSIS — I10 Essential (primary) hypertension: Secondary | ICD-10-CM

## 2019-03-26 DIAGNOSIS — R0789 Other chest pain: Secondary | ICD-10-CM | POA: Diagnosis not present

## 2019-03-26 DIAGNOSIS — Z8249 Family history of ischemic heart disease and other diseases of the circulatory system: Secondary | ICD-10-CM | POA: Insufficient documentation

## 2019-03-26 LAB — COMPREHENSIVE METABOLIC PANEL
ALT: 15 U/L (ref 0–44)
AST: 18 U/L (ref 15–41)
Albumin: 3.4 g/dL — ABNORMAL LOW (ref 3.5–5.0)
Alkaline Phosphatase: 57 U/L (ref 38–126)
Anion gap: 7 (ref 5–15)
BUN: 24 mg/dL — ABNORMAL HIGH (ref 8–23)
CO2: 27 mmol/L (ref 22–32)
Calcium: 9.1 mg/dL (ref 8.9–10.3)
Chloride: 104 mmol/L (ref 98–111)
Creatinine, Ser: 1.59 mg/dL — ABNORMAL HIGH (ref 0.61–1.24)
GFR calc Af Amer: 44 mL/min — ABNORMAL LOW (ref >60)
GFR calc non Af Amer: 38 mL/min — ABNORMAL LOW (ref >60)
Glucose, Bld: 141 mg/dL — ABNORMAL HIGH (ref 70–99)
Potassium: 3.9 mmol/L (ref 3.5–5.1)
Sodium: 138 mmol/L (ref 135–145)
Total Bilirubin: 0.8 mg/dL (ref 0.3–1.2)
Total Protein: 6.6 g/dL (ref 6.5–8.1)

## 2019-03-26 LAB — CBC WITH DIFFERENTIAL/PLATELET
Abs Immature Granulocytes: 0.14 10*3/uL — ABNORMAL HIGH (ref 0.00–0.07)
Basophils Absolute: 0 10*3/uL (ref 0.0–0.1)
Basophils Relative: 0 %
Eosinophils Absolute: 0.1 10*3/uL (ref 0.0–0.5)
Eosinophils Relative: 1 %
HCT: 30.3 % — ABNORMAL LOW (ref 39.0–52.0)
Hemoglobin: 10 g/dL — ABNORMAL LOW (ref 13.0–17.0)
Immature Granulocytes: 2 %
Lymphocytes Relative: 20 %
Lymphs Abs: 1.6 10*3/uL (ref 0.7–4.0)
MCH: 38.2 pg — ABNORMAL HIGH (ref 26.0–34.0)
MCHC: 33 g/dL (ref 30.0–36.0)
MCV: 115.6 fL — ABNORMAL HIGH (ref 80.0–100.0)
Monocytes Absolute: 3.1 10*3/uL — ABNORMAL HIGH (ref 0.1–1.0)
Monocytes Relative: 39 %
Neutro Abs: 3 10*3/uL (ref 1.7–7.7)
Neutrophils Relative %: 38 %
Platelets: 82 10*3/uL — ABNORMAL LOW (ref 150–400)
RBC: 2.62 MIL/uL — ABNORMAL LOW (ref 4.22–5.81)
RDW: 14.7 % (ref 11.5–15.5)
WBC: 8 10*3/uL (ref 4.0–10.5)
nRBC: 0 % (ref 0.0–0.2)

## 2019-03-26 LAB — D-DIMER, QUANTITATIVE: D-Dimer, Quant: 1.24 ug/mL-FEU — ABNORMAL HIGH (ref 0.00–0.50)

## 2019-03-26 LAB — NM MYOCAR MULTI W/SPECT W/WALL MOTION / EF
Estimated workload: 1 METS
Exercise duration (min): 5 min
MPHR: 160 {beats}/min
Peak HR: 99 {beats}/min
Percent HR: 75 %
Rest HR: 82 {beats}/min
TID: 1.36

## 2019-03-26 LAB — LIPID PANEL
Cholesterol: 141 mg/dL (ref 0–200)
HDL: 41 mg/dL (ref >40)
LDL Cholesterol: 86 mg/dL (ref 0–99)
Total CHOL/HDL Ratio: 3.4 RATIO
Triglycerides: 69 mg/dL (ref <150)
VLDL: 14 mg/dL (ref 0–40)

## 2019-03-26 LAB — TROPONIN I (HIGH SENSITIVITY)
Troponin I (High Sensitivity): 59 ng/L — ABNORMAL HIGH (ref <18)
Troponin I (High Sensitivity): 66 ng/L — ABNORMAL HIGH (ref <18)

## 2019-03-26 LAB — SARS CORONAVIRUS 2 (TAT 6-24 HRS): SARS Coronavirus 2: NEGATIVE

## 2019-03-26 MED ORDER — ASPIRIN 81 MG PO CHEW
CHEWABLE_TABLET | ORAL | Status: AC
  Administered 2019-03-26: 07:00:00
  Filled 2019-03-26: qty 3

## 2019-03-26 MED ORDER — ACETAMINOPHEN 325 MG PO TABS: 650.0000 mg | ORAL_TABLET | ORAL | Status: DC | PRN

## 2019-03-26 MED ORDER — ZOLPIDEM TARTRATE 5 MG PO TABS
10.0000 mg | ORAL_TABLET | Freq: Once | ORAL | Status: AC
  Administered 2019-03-27: 10 mg via ORAL
  Filled 2019-03-26: qty 2

## 2019-03-26 MED ORDER — PREGABALIN 25 MG PO CAPS
25.0000 mg | ORAL_CAPSULE | Freq: Three times a day (TID) | ORAL | Status: DC
  Administered 2019-03-26 – 2019-03-27 (×3): 25 mg via ORAL
  Filled 2019-03-26 (×4): qty 1

## 2019-03-26 MED ORDER — IOHEXOL 350 MG/ML SOLN
80.0000 mL | Freq: Once | INTRAVENOUS | Status: AC | PRN
  Administered 2019-03-26: 80 mL via INTRAVENOUS

## 2019-03-26 MED ORDER — SODIUM CHLORIDE 0.9 % IV BOLUS (SEPSIS)
500.0000 mL | Freq: Once | INTRAVENOUS | Status: AC
  Administered 2019-03-26: 06:00:00 500 mL via INTRAVENOUS

## 2019-03-26 MED ORDER — REGADENOSON 0.4 MG/5ML IV SOLN
INTRAVENOUS | Status: AC
  Filled 2019-03-26: qty 5

## 2019-03-26 MED ORDER — POLYETHYLENE GLYCOL 3350 17 G PO PACK: 17.0000 g | PACK | Freq: Every day | ORAL | Status: DC | PRN

## 2019-03-26 MED ORDER — TECHNETIUM TC 99M TETROFOSMIN IV KIT
30.0000 | PACK | Freq: Once | INTRAVENOUS | Status: AC | PRN
  Administered 2019-03-26: 30 via INTRAVENOUS

## 2019-03-26 MED ORDER — ENOXAPARIN SODIUM 40 MG/0.4ML ~~LOC~~ SOLN
40.0000 mg | SUBCUTANEOUS | Status: DC
  Administered 2019-03-26 – 2019-03-27 (×2): 40 mg via SUBCUTANEOUS
  Filled 2019-03-26 (×2): qty 0.4

## 2019-03-26 MED ORDER — AZELASTINE HCL 0.1 % NA SOLN
2.0000 | Freq: Two times a day (BID) | NASAL | Status: DC
  Administered 2019-03-26 – 2019-03-27 (×3): 2 via NASAL
  Filled 2019-03-26: qty 30

## 2019-03-26 MED ORDER — FOLIC ACID 1 MG PO TABS
2.0000 mg | ORAL_TABLET | Freq: Every day | ORAL | Status: DC
  Administered 2019-03-26 – 2019-03-27 (×2): 2 mg via ORAL
  Filled 2019-03-26 (×2): qty 2

## 2019-03-26 MED ORDER — ASPIRIN 81 MG PO CHEW
324.0000 mg | CHEWABLE_TABLET | Freq: Once | ORAL | Status: AC
  Administered 2019-03-26: 324 mg via ORAL
  Filled 2019-03-26: qty 4

## 2019-03-26 MED ORDER — REGADENOSON 0.4 MG/5ML IV SOLN
0.4000 mg | Freq: Once | INTRAVENOUS | Status: AC
  Administered 2019-03-26: 14:00:00 0.4 mg via INTRAVENOUS

## 2019-03-26 MED ORDER — ONDANSETRON HCL 4 MG/2ML IJ SOLN
4.0000 mg | Freq: Four times a day (QID) | INTRAMUSCULAR | Status: DC | PRN
  Filled 2019-03-26: qty 2

## 2019-03-26 MED ORDER — HYDROCODONE-ACETAMINOPHEN 7.5-325 MG PO TABS: 1.0000 | ORAL_TABLET | Freq: Four times a day (QID) | ORAL | Status: DC | PRN

## 2019-03-26 MED ORDER — HYDROCODONE-ACETAMINOPHEN 5-325 MG PO TABS
1.0000 | ORAL_TABLET | Freq: Once | ORAL | Status: AC
  Administered 2019-03-26: 1 via ORAL
  Filled 2019-03-26: qty 1

## 2019-03-26 MED ORDER — TECHNETIUM TC 99M TETROFOSMIN IV KIT
10.0000 | PACK | Freq: Once | INTRAVENOUS | Status: AC | PRN
  Administered 2019-03-26: 10 via INTRAVENOUS

## 2019-03-26 MED ORDER — BETHANECHOL CHLORIDE 25 MG PO TABS
25.0000 mg | ORAL_TABLET | Freq: Three times a day (TID) | ORAL | Status: DC
  Administered 2019-03-26 – 2019-03-27 (×3): 25 mg via ORAL
  Filled 2019-03-26 (×8): qty 1

## 2019-03-26 MED ORDER — ALLOPURINOL 100 MG PO TABS
100.0000 mg | ORAL_TABLET | Freq: Every day | ORAL | Status: DC
  Administered 2019-03-26 – 2019-03-27 (×2): 100 mg via ORAL
  Filled 2019-03-26 (×3): qty 1

## 2019-03-26 MED ORDER — ALPRAZOLAM 0.5 MG PO TABS
0.5000 mg | ORAL_TABLET | Freq: Three times a day (TID) | ORAL | Status: DC | PRN
  Filled 2019-03-26: qty 1

## 2019-03-26 MED ORDER — PSYLLIUM 95 % PO PACK
1.0000 | PACK | Freq: Every day | ORAL | Status: DC
  Filled 2019-03-26 (×2): qty 1

## 2019-03-26 NOTE — Progress Notes (Signed)
Pt tolerated lexiscan stress test very well. He is very talkative and pleasant during the procedure. Denies chest pain or shortness of breath at this time. Vitals remain stable

## 2019-03-26 NOTE — Consult Note (Addendum)
Cardiology Consultation:   Patient ID: Jeremiah Walker MRN: [id number]; DOB: 30-Apr-1928  Admit date: 03/26/2019 Date of Consult: 03/26/2019  Primary Care Provider: [name], MD Primary Cardiologist: No primary care provider on file.  Primary Electrophysiologist:  None   Patient Profile:   Jeremiah Walker is a 85 y.o. male with a hx of CML, HTN, CKD III, anemia who is being seen today for the evaluation of chest pain at the request of Dr. Lorin Mercy.  History of Present Illness:   Jeremiah Walker is a 85 year old male with past medical history noted above.  He has never been evaluated by cardiologist.  Does report that he has undergone several stress tests over the years in regards to his management of lymphoma/leukemia.  He is followed by his PCP as well as Dr. Marin Olp with oncology.  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  This is felt to be residual from his chemotherapy for his lymphoma.  He receives Aranesp through the cancer center every 3 weeks.  He has chronic anemia with a hemoglobin around 9, and thrombocytopenia with platelet count chronically ranging between 70,000-100,000.   He currently lives at [facility] living facility.  He is very active and still drives on a regular basis.  He swims and walks the grounds daily.  He does not normally have any anginal symptoms with his daily exertional level.  Dates about a week ago after swimming in the pool for about 45 minutes he came inside and laid down on the floor to do stretching and developed centralized chest pressure rated 2/10 that lasted about an hour and was relieved without medication.  Yesterday he again had been swimming for about 45 minutes, and had come inside.  Several hours later, he laid on the floor to do his stretches for his upper back and developed recurrent centralized chest pain, no radiation to arms or jaws.  No associated shortness of breath or nausea.   Rated 3/10.  Symptoms lingered and he took 1 of his oxycodone that he uses for back pain. No significant relief with this.  Duration of symptoms or from around 9 PM to around 2 AM.  He called EMS and was brought to the ED for further evaluation.  Refused aspirin and nitroglycerin while in route.  States his symptoms lingered until around 7 AM this morning.  In the ED his labs showed stable electrolytes, creatinine 1.5, high-sensitivity troponin 56>> 66, hemoglobin 10, platelet count 82,000, d-dimer 1.24.  CT chest was negative for PE, but did note atherosclerosis in the coronary arteries, and aortic valve calcification. EKG showed sinus rhythm with no acute ST/T wave abnormalities.  Chest x-ray showed borderline cardiomegaly with bibasilar opacities favoring atelectasis.  No further chest pain since 7 AM this morning.  Heart Pathway Score:     Past Medical History:  Diagnosis Date   Anemia    Anemia due to antineoplastic chemotherapy 08/15/2015   Anxiety    Arthritis    Cataract    Chronic kidney disease (CKD), stage III (moderate) 02/09/2015   Controlled   CMML (chronic myelomonocytic leukemia) ([place])    gets Aranesp about monthly, otherwise surveillance with Dr. Marin Olp   Combined fat and carbohydrate induced hyperlipemia 02/09/2015   Controlled   Depression    DLBCL (diffuse large B cell lymphoma) ([place]) 08/15/2015   Essential (primary) hypertension 02/09/2015   Gastro-esophageal reflux disease without esophagitis 02/09/2015   Controlled   Tinnitus  Past Surgical History:  Procedure Laterality Date   CATARACT EXTRACTION, BILATERAL     COLONOSCOPY  May 2011   CYSTOSCOPY     TOTAL HIP ARTHROPLASTY  2003   Right    TOTAL HIP ARTHROPLASTY  April, 2011   Left   TRIGGER FINGER RELEASE  Nov 2013   TURBINECTOMY  2006   UPPER GI ENDOSCOPY  Nov 2015     Home Medications:  Prior to Admission medications   Medication Sig Start Date End Date Taking? Authorizing  Provider  allopurinol (ZYLOPRIM) 100 MG tablet Take 100 mg by mouth daily. 03/17/19  Yes [provider]  ALPRAZolam (XANAX) 0.5 MG tablet TAKE 1 TABLET(0.5 MG) BY MOUTH THREE TIMES DAILY AS NEEDED FOR ANXIETY Patient taking differently: Take 0.5 mg by mouth 3 (three) times daily as needed for anxiety.  02/26/19  Yes Copland, Gay Filler, MD  amoxicillin (AMOXIL) 500 MG capsule Take 4 caps (2gm) by mouth once prior to dental procedure 06/04/18  Yes Copland, Gay Filler, MD  azelastine (ASTELIN) 0.1 % nasal spray Place 2 sprays into both nostrils 2 (two) times daily. Use in each nostril as directed 03/24/18  Yes Copland, Gay Filler, MD  b complex vitamins tablet Take 1 tablet by mouth daily.   Yes [provider]  bethanechol (URECHOLINE) 25 MG tablet Take 1 tablet (25 mg total) by mouth 3 (three) times daily. 06/11/18  Yes Copland, Gay Filler, MD  Calcium 500 MG tablet Take 600 mg by mouth daily.   Yes [provider]  Cholecalciferol (VITAMIN D3) 2000 UNITS capsule Take 2,000 Units by mouth daily.   Yes [provider]  folic acid (FOLVITE) 1 MG tablet TAKE 2 TABLETS(2 MG) BY MOUTH DAILY Patient taking differently: Take 2 mg by mouth daily.  02/05/19  Yes Ennever, Rudell Cobb, MD  HYDROcodone-acetaminophen (NORCO) 7.5-325 MG tablet Take 1 tablet by mouth every 6 (six) hours as needed for moderate pain. 03/23/19  Yes Copland, Gay Filler, MD  Magnesium Oxide 200 MG TABS Take 200 mg by mouth daily.  05/02/11  Yes [provider]  Multiple Vitamin (MULTIVITAMIN) tablet Take 1 tablet by mouth daily.   Yes [provider]  Omega-3 Fatty Acids (OMEGA-3 FISH OIL PO) Take 2 capsules by mouth daily. EPA/DHA 520/350    Yes [provider]  polyethylene glycol (MIRALAX / GLYCOLAX) packet Take 17 g by mouth daily as needed for mild constipation.    Yes [provider]  predniSONE (DELTASONE) 20 MG tablet May take 1 tablet daily for up to 3 days as needed for  gout attack or muscle spasm 01/30/19  Yes Copland, Gay Filler, MD  pregabalin (LYRICA) 25 MG capsule Take 1 capsule (25 mg total) by mouth 3 (three) times daily. 03/04/19  Yes Copland, Gay Filler, MD  psyllium (METAMUCIL) 58.6 % powder Take 1 packet by mouth daily.    Yes [provider]  UNABLE TO FIND Take 1 tablet by mouth daily. Med Name: B-6 VITAMIN    Yes [provider]  vitamin C (ASCORBIC ACID) 500 MG tablet Take 500 mg by mouth daily.   Yes [provider]  zinc gluconate 50 MG tablet Take 50 mg by mouth daily.   Yes [provider]    Inpatient Medications: Scheduled Meds:  allopurinol  100 mg Oral Daily   azelastine  2 spray Each Nare BID   bethanechol  25 mg Oral TID   enoxaparin (LOVENOX) injection  40 mg  Subcutaneous I34V   folic acid  2 mg Oral Daily   pregabalin  25 mg Oral TID   psyllium  1 packet Oral Daily   Continuous Infusions:  PRN Meds: acetaminophen, ALPRAZolam, HYDROcodone-acetaminophen, ondansetron (ZOFRAN) IV, polyethylene glycol  Allergies:    Allergies  Allergen Reactions   Nsaids Other (See Comments) and Tinitus    Bruising    Tolmetin Other (See Comments)    Bruising   Ciprofloxacin Other (See Comments)    Other reaction(s): Other (See Comments) Achilles tendon Achilles tendon Achilles tendon Achilles tendon   Statins Nausea And Vomiting and Other (See Comments)    Other reaction(s): Kidney issues Muscle pain Muscle pain Other reaction(s): Kidney issues Muscle pain Muscle pain Other reaction(s): Kidney issues Muscle pain Muscle pain Other reaction(s): Kidney issues Muscle pain   Requip [Ropinirole Hcl] Anxiety   Ropinirole Anxiety    Mood swing    Social History:   Social History   Socioeconomic History   Marital status: Widowed    Spouse name: Not on file   Number of children: Not on file   Years of education: Not on file   Highest education level: Not on file  Occupational  History   Not on file  Social Needs   Financial resource strain: Not on file   Food insecurity    Worry: Not on file    Inability: Not on file   Transportation needs    Medical: Not on file    Non-medical: Not on file  Tobacco Use   Smoking status: Former Smoker   Smokeless tobacco: Never Used   Tobacco comment: Quit >50 years ago  Substance and Sexual Activity   Alcohol use: Yes    Alcohol/week: 1.0 - 4.0 standard drinks    Types: 1 - 4 Shots of liquor per week    Comment: 4 glasses per week   Drug use: No   Sexual activity: Not on file  Lifestyle   Physical activity    Days per week: Not on file    Minutes per session: Not on file   Stress: Not on file  Relationships   Social connections    Talks on phone: Not on file    Gets together: Not on file    Attends religious service: Not on file    Active member of club or organization: Not on file    Attends meetings of clubs or organizations: Not on file    Relationship status: Not on file   Intimate partner violence    Fear of current or ex partner: Not on file    Emotionally abused: Not on file    Physically abused: Not on file    Forced sexual activity: Not on file  Other Topics Concern   Not on file  Social History Narrative   Not on file    Family History:    Family History  Problem Relation Age of Onset   Anuerysm Mother 85       Deceased   Colon cancer Father 4       Deceased   Colon cancer Brother    Prostate cancer Brother        Deceased   Alzheimer's disease Brother    Heart disease Brother    Alzheimer's disease Paternal Grandmother    Early death Maternal Grandmother        Unknown   Obesity Son    Obesity Son        Gastric Bypass  Healthy Son    Healthy Daughter    Diabetes Neg Hx      ROS:  Please see the history of present illness.   All other ROS reviewed and negative.     Physical Exam/Data:   Vitals:   03/26/19 0515 03/26/19 0652 03/26/19 0711  03/26/19 0730  BP: 125/63 131/72  126/64  Pulse:  81  81  Resp: 16 (!) 23  (!) 22  Temp:   97.8 F (36.6 C)   TempSrc:   Oral   SpO2:  97%  97%  Weight:      Height:       No intake or output data in the 24 hours ending 03/26/19 1022 Last 3 Weights 03/26/2019 02/26/2019 02/05/2019  Weight (lbs) 205 lb 211 lb 208 lb  Weight (kg) 92.987 kg 95.709 kg 94.348 kg     Body mass index is 29.41 kg/m.  General:  Well nourished, well developed, older WM, in no acute distress HEENT: normal Lymph: no adenopathy Neck: no JVD Endocrine:  No thryomegaly Vascular: No carotid bruits Cardiac:  normal S1, S2; RRR; 2/6 systolic murmur  Lungs:  clear to auscultation bilaterally, no wheezing, rhonchi or rales  Abd: soft, obese, no hepatomegaly  Ext: no edema Musculoskeletal:  No deformities, BUE and BLE strength normal and equal Skin: warm and dry  Neuro:  CNs 2-12 intact, no focal abnormalities noted Psych:  Normal affect   EKG:  The EKG was personally reviewed and demonstrates: SR with non acute ST/T wave changes.  Relevant CV Studies:  TTE: 02/2018  Study Conclusions  - Left ventricle: The cavity size was normal. Wall thickness was   increased in a pattern of moderate LVH. Systolic function was   normal. The estimated ejection fraction was in the range of 60%   to 65%. Wall motion was normal; there were no regional wall   motion abnormalities. Doppler parameters are consistent with   abnormal left ventricular relaxation (grade 1 diastolic   dysfunction). - Aortic valve: There was mild stenosis. There was trivial   regurgitation. Valve area (VTI): 1.46 cm^2. Valve area (Vmax):   1.36 cm^2. Valve area (Vmean): 1.24 cm^2. - Mitral valve: There was mild regurgitation. - Left atrium: The atrium was mildly to moderately dilated.  Impressions:  - 1. Left ventricular systolic function is preserved visually   estimated ejection fraction is 60 to 65%. Concentric left   ventricular  hypertrophy seen. Impaired left ventricular   relaxation.   2. Mild aortic stenosis with trace aortic regurgitation.   3. Mild to moderate left atrial enlargement.   4. Mild mitral regurgitation.  Laboratory Data:  High Sensitivity Troponin:   Recent Labs  Lab 03/26/19 0055 03/26/19 0225  TROPONINIHS 59* 66*     Chemistry Recent Labs  Lab 03/26/19 0055  NA 138  K 3.9  CL 104  CO2 27  GLUCOSE 141*  BUN 24*  CREATININE 1.59*  CALCIUM 9.1  GFRNONAA 38*  GFRAA 44*  ANIONGAP 7    Recent Labs  Lab 03/26/19 0055  PROT 6.6  ALBUMIN 3.4*  AST 18  ALT 15  ALKPHOS 57  BILITOT 0.8   Hematology Recent Labs  Lab 03/26/19 0055  WBC 8.0  RBC 2.62*  HGB 10.0*  HCT 30.3*  MCV 115.6*  MCH 38.2*  MCHC 33.0  RDW 14.7  PLT 82*   BNPNo results for input(s): BNP, PROBNP in the last 168 hours.  DDimer  Recent Labs  Lab 03/26/19  6644  DDIMER 1.24*     Radiology/Studies:  Dg Chest 1 View  Result Date: 03/26/2019 CLINICAL DATA:  Chest pain. EXAM: CHEST  1 VIEW COMPARISON:  Chest CT 12/16/2017, radiograph 09/25/2017 FINDINGS: Borderline mild cardiomegaly. Unchanged mediastinal contours with aortic atherosclerosis and tortuosity. No pulmonary edema, pleural effusion, or pneumothorax. Streaky bibasilar opacities favoring atelectasis. Remote left clavicle fracture. IMPRESSION: 1. Borderline mild cardiomegaly. 2. Streaky bibasilar opacities favoring atelectasis. Electronically Signed   By: Keith Rake M.D.   On: 03/26/2019 01:13   Ct Angio Chest Pe W And/or Wo Contrast  Result Date: 03/26/2019 CLINICAL DATA:  Central chest pain.  Elevated D-dimer EXAM: CT ANGIOGRAPHY CHEST WITH CONTRAST TECHNIQUE: Multidetector CT imaging of the chest was performed using the standard protocol during bolus administration of intravenous contrast. Multiplanar CT image reconstructions and MIPs were obtained to evaluate the vascular anatomy. CONTRAST:  43mL OMNIPAQUE IOHEXOL 350 MG/ML SOLN  COMPARISON:  Chest CT 12/16/2017 FINDINGS: Cardiovascular: Normal heart size with no pericardial effusion. Prominent aortic valve calcification. Multifocal aortic and coronary calcification. There is limited opacification of the aorta without dilatation. No pulmonary artery filling defect Mediastinum/Nodes: Are not history of lymphoma with no adenopathy seen today. Lungs/Pleura: Chronic non-specific reticulation at the lung bases associated with or accentuated by low volumes. No honeycombing. There is no edema, consolidation, or pneumothorax. Calcified granulomas in the left lung. Trace pleural fluid on the left Upper Abdomen: No acute finding Musculoskeletal: Spondylosis with multi-level bridging osteophyte. Bulky osteophytes at the open levels. No acute or aggressive finding. Review of the MIP images confirms the above findings. IMPRESSION: 1. Negative for pulmonary embolism. 2. Atherosclerosis, including the coronary arteries. 3. Aortic valve calcification. 4. Trace pleural fluid on the left. Electronically Signed   By: Monte Fantasia M.D.   On: 03/26/2019 06:04    Assessment and Plan:   Jeremiah Walker is a 85 y.o. male with a hx of CML, HTN, CKD III, anemia who is being seen today for the evaluation of chest pain at the request of Dr. Lorin Mercy.  1. Chest pain: symptoms are somewhat atypical, presenting several hours after activity. Episode last night lingered for several hours. HsT mildly elevated 59>>66. EKG without acute changes. Complicated situation with his CML. Discussed options with the patient and favor starting conservatively with Lexiscan myoview. IF abnormal then will need to discuss more invasive therapy. Will plan for today as he has been NPO.  2. HTN: reports not being on any agents currently. Blood pressures stable  3. HL: developed rhabo while on statin therapy in the past. LDL 85 without current therapy.   4. CML: with anemia and thrombocytopenia. Followed by Dr. Marin Olp through the  [facility]. On aranesp q3 weeks. Hgb 10, Platelets 82,000.   5. CKD III: Cr at baseline on admission.   For questions or updates, please contact [place] Please consult [email or web address] for contact info under     Signed, Reino Bellis, NP  03/26/2019 10:22 AM   Patient seen and examined.  Agree with above documentation.  Patient is a 85 year old male with a history of CML, CKD stage III, hypertension who presents for an evaluation for chest pain.  No cardiac history.  Reports that yesterday he was doing water aerobics and had no chest pain while exercising.  However that evening he was lying on the floor doing stretches and began having substernal chest pain.  Described 2-3 out of 10 pain which started at 9 PM last night and continued until 7  AM this morning.  EKG personally reviewed and shows sinus rhythm with no acute ST/T wave abnormalities.  Labs notable for high-sensitivity troponin mildly elevated (56->66).  Underwent a CT PA which showed no evidence of PE but did note coronary artery atherosclerosis.  He is currently chest pain-free.  On exam, he is alert and oriented, regular rate and rhythm, no murmurs, no lower extremity edema, no JVD, lungs are clear to auscultation bilaterally.  Given atypical chest pain with mild troponin elevation, and considering history of CKD and anemia/thrombocytopenia due to CML, would favor a conservative approach.  We will plan for nuclear stress test, and likely medical management unless high risk defect.  We will also check a TTE.  Jeremiah Heinz, MD

## 2019-03-26 NOTE — ED Triage Notes (Signed)
Came in via EMS; c/o chest pain in central location stated about 2 hours ago. Pt reported was doing some strengthening exercises and might have pulled a muscle then. Per EMS, patient refused ASA and NTG.

## 2019-03-26 NOTE — Plan of Care (Signed)

## 2019-03-26 NOTE — ED Notes (Signed)
Report given to Williemae Area RN.

## 2019-03-26 NOTE — Progress Notes (Signed)
   Jeremiah Walker presented for a lexiscan cardiolite today.  No immediate complications.  Stress imaging is pending at this time.  Reino Bellis, NP 03/26/2019, 2:07 PM

## 2019-03-26 NOTE — ED Provider Notes (Signed)
TIME SEEN: 1:03 AM  CHIEF COMPLAINT: Chest pain  HPI: Patient is a 85 year old male with history of CML, hypertension, chronic kidney disease who presents to the emergency department with central anterior chest pain that he describes as a sharp pain that started at 9 PM.  States pain is worse with palpation, opening of his chest wall and deep inspiration.  No tightness or pressure.  No shortness of breath, nausea, vomiting, diaphoresis or dizziness.  No fevers or cough.  No lower extremity swelling or pain.  Denies history of CAD, PE, DVT.  States he exercises for 45 minutes doing water exercises that he describes as moderately strenuous 3 times a week and never has symptoms of chest pain or shortness of breath.  He states the symptoms tonight started after he was doing stretching to help with his chronic thoracic back pain.  He did take hydrocodone 7.5 mg at 9 PM which did help his pain somewhat.  Pain has not completely resolved.  Patient states that he lives at a retirement home.  ROS: See HPI Constitutional: no fever  Eyes: no drainage  ENT: no runny nose   Cardiovascular:   chest pain  Resp: no SOB  GI: no vomiting GU: no dysuria Integumentary: no rash  Allergy: no hives  Musculoskeletal: no leg swelling  Neurological: no slurred speech ROS otherwise negative  PAST MEDICAL HISTORY/PAST SURGICAL HISTORY:  Past Medical History:  Diagnosis Date  . Anemia   . Anemia due to antineoplastic chemotherapy 08/15/2015  . Anxiety   . Arthritis   . Cancer ([place])   . Cataract   . Chronic kidney disease (CKD), stage III (moderate) ([facility]) 02/09/2015   Controlled  . CMML (chronic myelomonocytic leukemia) ([place])   . CMML (chronic myelomonocytic leukemia) ([place])   . Combined fat and carbohydrate induced hyperlipemia 02/09/2015   Controlled  . Depression   . DLBCL (diffuse large B cell lymphoma) ([place]) 08/15/2015  . Essential (primary) hypertension 02/09/2015  . Gastro-esophageal reflux disease without  esophagitis 02/09/2015   Controlled  . Neuromuscular disorder ([place])   . Tinnitus     MEDICATIONS:  Prior to Admission medications   Medication Sig Start Date End Date Taking? Authorizing Provider  allopurinol (ZYLOPRIM) 300 MG tablet Take 1 tablet (300 mg total) by mouth daily. 12/30/18   Copland, Gay Filler, MD  ALPRAZolam (XANAX) 0.5 MG tablet TAKE 1 TABLET(0.5 MG) BY MOUTH THREE TIMES DAILY AS NEEDED FOR ANXIETY 02/26/19   Copland, Gay Filler, MD  amoxicillin (AMOXIL) 500 MG capsule Take 4 caps (2gm) by mouth once prior to dental procedure 06/04/18   Copland, Gay Filler, MD  azelastine (ASTELIN) 0.1 % nasal spray Place 2 sprays into both nostrils 2 (two) times daily. Use in each nostril as directed 03/24/18   Copland, Gay Filler, MD  b complex vitamins tablet Take 1 tablet by mouth daily.    [provider]  bethanechol (URECHOLINE) 25 MG tablet Take 1 tablet (25 mg total) by mouth 3 (three) times daily. 06/11/18   Copland, Gay Filler, MD  Calcium 500 MG tablet Take 600 mg by mouth daily.    [provider]  Cholecalciferol (VITAMIN D3) 2000 UNITS capsule Take 2,000 Units by mouth daily.    [provider]  Darbepoetin Alfa (ARANESP, ALBUMIN FREE, IJ) Inject as directed.    [provider]  folic acid (FOLVITE) 1 MG tablet TAKE 2 TABLETS(2 MG) BY MOUTH DAILY 02/05/19   [name], MD  gabapentin (NEURONTIN) 300  MG capsule TAKE 1 CAPSULES BY MOUTH THREE TIMES DAILY, PLUS 2 CAPSULES EVERY DAY AS NEEDED 02/20/19   Copland, Gay Filler, MD  HYDROcodone-acetaminophen (NORCO) 7.5-325 MG tablet Take 1 tablet by mouth every 6 (six) hours as needed for moderate pain. 03/23/19   Copland, Gay Filler, MD  Magnesium Oxide 200 MG TABS Take by mouth. 05/02/11   [provider]  Multiple Vitamin (MULTIVITAMIN) tablet Take 1 tablet by mouth daily.    [provider]  Omega-3 Fatty Acids (OMEGA-3 FISH OIL PO) Take 2 each by mouth every morning. EPA/DHA 520/350     [provider]  polyethylene glycol (MIRALAX / GLYCOLAX) packet Take 17 g by mouth daily as needed.    [provider]  predniSONE (DELTASONE) 20 MG tablet May take 1 tablet daily for up to 3 days as needed for gout attack or muscle spasm 01/30/19   Copland, Gay Filler, MD  pregabalin (LYRICA) 25 MG capsule Take 1 capsule (25 mg total) by mouth 3 (three) times daily. 03/04/19   Copland, Gay Filler, MD  psyllium (METAMUCIL) 58.6 % powder Take 1 packet by mouth daily. 1 Teaspoon daily    [provider]  UNABLE TO FIND Med Name: B-6 VITAMIN    [provider]  vitamin C (ASCORBIC ACID) 500 MG tablet Take 500 mg by mouth daily.    [provider]  zinc gluconate 50 MG tablet Take 50 mg by mouth daily.    [provider]    ALLERGIES:  Allergies  Allergen Reactions  . Nsaids Other (See Comments) and Tinitus    Bruising   . Tolmetin Other (See Comments)    Bruising  . Ciprofloxacin Other (See Comments)    Other reaction(s): Other (See Comments) Achilles tendon Achilles tendon Achilles tendon Achilles tendon  . Statins Nausea And Vomiting and Other (See Comments)    Other reaction(s): Kidney issues Muscle pain Muscle pain Other reaction(s): Kidney issues Muscle pain Muscle pain Other reaction(s): Kidney issues Muscle pain Muscle pain Other reaction(s): Kidney issues Muscle pain  . Requip [Ropinirole Hcl] Anxiety  . Ropinirole Anxiety    Mood swing    SOCIAL HISTORY:  Social History   Tobacco Use  . Smoking status: Former Research scientist (life sciences)  . Smokeless tobacco: Never Used  . Tobacco comment: Quit >50 years ago  Substance Use Topics  . Alcohol use: Yes    Alcohol/week: 1.0 - 4.0 standard drinks    Types: 1 - 4 Shots of liquor per week    Comment: 4 glasses per week    FAMILY HISTORY: Family History  Problem Relation Age of Onset  . Anuerysm Mother 93       Deceased  . Colon cancer Father 75       Deceased  . Colon cancer  Brother   . Prostate cancer Brother        Deceased  . Alzheimer's disease Brother   . Heart disease Brother   . Alzheimer's disease Paternal Grandmother   . Early death Maternal Grandmother        Unknown  . Obesity Son   . Obesity Son        Gastric Bypass  . Healthy Son   . Healthy Daughter   . Diabetes Neg Hx     EXAM: BP (!) 150/90 (BP Location: Right Arm)   Pulse 88   Temp 98.6 F (37 C) (Oral)   Resp 16   Ht 5\' 10"  (1.778 m)  Wt 93 kg   SpO2 98%   BMI 29.41 kg/m  CONSTITUTIONAL: Alert and oriented and responds appropriately to questions. Well-appearing; well-nourished, elderly, no distress HEAD: Normocephalic EYES: Conjunctivae clear, pupils appear equal, EOMI ENT: normal nose; moist mucous membranes NECK: Supple, no meningismus, no nuchal rigidity, no LAD  CARD: RRR; S1 and S2 appreciated; no murmurs, no clicks, no rubs, no gallops CHEST:  Chest wall is tender to palpation over the anterior center chest wall.  No crepitus, ecchymosis, erythema, warmth, rash or other lesions present.   RESP: Normal chest excursion without splinting or tachypnea; breath sounds clear and equal bilaterally; no wheezes, no rhonchi, no rales, no hypoxia or respiratory distress, speaking full sentences ABD/GI: Normal bowel sounds; non-distended; soft, non-tender, no rebound, no guarding, no peritoneal signs, no hepatosplenomegaly BACK:  The back appears normal and is non-tender to palpation, there is no CVA tenderness EXT: Normal ROM in all joints; non-tender to palpation; no edema; normal capillary refill; no cyanosis, no calf tenderness or swelling    SKIN: Normal color for age and race; warm; no rash NEURO: Moves all extremities equally PSYCH: The patient's mood and manner are appropriate. Grooming and personal hygiene are appropriate.  MEDICAL DECISION MAKING: Patient here with complaints of atypical chest pain.  Low suspicion that this is ACS.  He is able to exercise regularly without  chest pain or shortness of breath.  Troponin x2 pending.  He describes it as pleuritic in nature and seems to be musculoskeletal but PE is on the differential especially given his history of CML.  He is not tachycardic, tachypneic or hypoxic.  My suspicion for PE is low, will obtain d-dimer.  EKG shows no ischemic abnormality.  Chest x-ray shows mild cardiomegaly and atelectasis.  No infectious symptoms to suggest pneumonia, COVID.  Does not appear volume overloaded on exam.  ED PROGRESS: Patient's troponins are mildly elevated with no old for comparison.  This may be related to his chronic kidney disease.  His d-dimer is elevated as well.  Will obtain CTA of the chest.  I have reviewed patient's CTA and do not see any pulmonary embolus or other lung abnormality.  I recommended admission for observation to cycle his troponins.  Would like to err on the side of caution and patient is comfortable with this plan as well.  Currently pain-free.  5:52 AM Discussed patient's case with hospitalist, Dr. Hal Hope.  I have recommended admission and patient (and family if present) agree with this plan. Admitting physician will place admission orders.   I reviewed all nursing notes, vitals, pertinent previous records, EKGs, lab and urine results, imaging (as available).    Jeremiah Walker was evaluated in Emergency Department on 03/26/2019 for the symptoms described in the history of present illness. He was evaluated in the context of the global COVID-19 pandemic, which necessitated consideration that the patient might be at risk for infection with the SARS-CoV-2 virus that causes COVID-19. Institutional protocols and algorithms that pertain to the evaluation of patients at risk for COVID-19 are in a state of rapid change based on information released by regulatory bodies including the CDC and federal and state organizations. These policies and algorithms were followed during the patient's care in the ED.    EKG  Interpretation  Date/Time:  Thursday March 26 2019 00:23:56 EDT Ventricular Rate:  90 PR Interval:    QRS Duration: 93 QT Interval:  369 QTC Calculation: 452 R Axis:   -17 Text Interpretation:  Sinus rhythm  Borderline left axis deviation Minimal ST elevation, anterior leads No old tracing to compare Confirmed by Abygayle Deltoro, Cyril Mourning [phone]) on 03/26/2019 12:43:12 AM         Dula Havlik, Delice Bison, DO 03/26/19 0180

## 2019-03-26 NOTE — H&P (Addendum)
History and Physical    Jeremiah Walker ZGY:[id number] DOB: [birth year] DOA: 03/26/2019  PCP: Jeremiah Mclean, MD Consultants:  Jeremiah Walker - oncology; Jeremiah Walker; Jeremiah Walker - nephrology; Jeremiah Walker - PM&R Patient coming from:  Jeremiah Walker; NOK: Jeremiah Walker), [phone], and Jeremiah Walker), [phone]  Chief Complaint: chest pain  HPI: Jeremiah Walker is a 85 y.o. 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 He reports that at 9pm he developed substernal chest pain.  It has resolved in the last 90 minutes.  He has back pain and had done exercises prior and thought it might be MSK; he also had 45 minutes of exercise that morning without pain.  He had pain with deep breathing.  The pain continued for about 90 minutes and decided to come in.  He thinks the pain resolved with time, spontaneously.  Hydrocodone did not help.  He felt well prior and feels good now.  He is very functional at baseline, living independently with routine exercise and he continues to drive.   ED Course:  Carryover, per Dr. Hal Walker:  85 year old male with a history of CML presents with chest pain. Given the symptoms it was concerning for ACS but troponin only were mildly elevated. Patient admitted for chest pain evaluation.CT angiogram of the chest is pending.  Review of Systems: As per HPI; otherwise review of systems reviewed and negative.   Ambulatory Status:  Ambulates with a cane or Walker  Past Medical History:  Diagnosis Date   Anemia    Anemia due to antineoplastic chemotherapy 08/15/2015   Anxiety    Arthritis    Cataract    Chronic kidney disease (CKD), stage III (moderate) 02/09/2015   Controlled   CMML (chronic myelomonocytic leukemia) ([place])    gets Aranesp about monthly, otherwise surveillance with Dr. Marin Walker   Combined fat and carbohydrate induced hyperlipemia 02/09/2015   Controlled   Depression    DLBCL (diffuse large B cell lymphoma) ([place]) 08/15/2015   Essential (primary) hypertension 02/09/2015   Gastro-esophageal reflux disease without esophagitis 02/09/2015   Controlled   Tinnitus     Past Surgical History:  Procedure Laterality Date   CATARACT EXTRACTION, BILATERAL     COLONOSCOPY  May 2011   CYSTOSCOPY     TOTAL HIP ARTHROPLASTY  2003   Right    TOTAL HIP ARTHROPLASTY  April, 2011   Left   TRIGGER FINGER RELEASE  Nov 2013   TURBINECTOMY  2006   UPPER GI ENDOSCOPY  Nov 2015    Social History   Socioeconomic History   Marital status: Widowed    Spouse name: Not on file   Number of children: Not on file   Years of education: Not on file   Highest education level: Not on file  Occupational History   Not on file  Social Needs   Financial resource strain: Not on file   Food insecurity    Worry: Not on file    Inability: Not on file   Transportation needs    Medical: Not on file    Non-medical: Not on file  Tobacco Use   Smoking status: Former Smoker   Smokeless tobacco: Never Used   Tobacco comment: Quit >50 years ago  Substance and Sexual Activity   Alcohol use: Yes    Alcohol/week: 1.0 - 4.0 standard drinks    Types: 1 - 4 Shots of liquor per week  Comment: 4 glasses per week   Drug use: No   Sexual activity: Not on file  Lifestyle   Physical activity    Days per week: Not on file    Minutes per session: Not on file   Stress: Not on file  Relationships   Social connections    Talks on phone: Not on file    Gets together: Not on file    Attends religious service: Not on file    Active member of club or organization: Not on file    Attends meetings of clubs or organizations: Not on file    Relationship status: Not on file   Intimate partner violence    Fear of current or ex partner: Not on file    Emotionally abused: Not on file    Physically abused: Not on file    Forced sexual  activity: Not on file  Other Topics Concern   Not on file  Social History Narrative   Not on file    Allergies  Allergen Reactions   Nsaids Other (See Comments) and Tinitus    Bruising    Tolmetin Other (See Comments)    Bruising   Ciprofloxacin Other (See Comments)    Other reaction(s): Other (See Comments) Achilles tendon Achilles tendon Achilles tendon Achilles tendon   Statins Nausea And Vomiting and Other (See Comments)    Other reaction(s): Kidney issues Muscle pain Muscle pain Other reaction(s): Kidney issues Muscle pain Muscle pain Other reaction(s): Kidney issues Muscle pain Muscle pain Other reaction(s): Kidney issues Muscle pain   Requip [Ropinirole Hcl] Anxiety   Ropinirole Anxiety    Mood swing    Family History  Problem Relation Age of Onset   Anuerysm Mother 48       Deceased   Colon cancer Father 27       Deceased   Colon cancer Brother    Prostate cancer Brother        Deceased   Alzheimer's disease Brother    Heart disease Brother    Alzheimer's disease Paternal Grandmother    Early death Maternal Grandmother        Unknown   Obesity Jeremiah    Obesity Jeremiah        Gastric Bypass   Healthy Jeremiah    Healthy Daughter    Diabetes Neg Hx     Prior to Admission medications   Medication Sig Start Date End Date Taking? Authorizing Provider  allopurinol (ZYLOPRIM) 100 MG tablet Take 100 mg by mouth daily. 03/17/19  Yes [provider]  ALPRAZolam (XANAX) 0.5 MG tablet TAKE 1 TABLET(0.5 MG) BY MOUTH THREE TIMES DAILY AS NEEDED FOR ANXIETY Patient taking differently: Take 0.5 mg by mouth 3 (three) times daily as needed for anxiety.  02/26/19  Yes Copland, Gay Filler, MD  amoxicillin (AMOXIL) 500 MG capsule Take 4 caps (2gm) by mouth once prior to dental procedure 06/04/18  Yes Copland, Gay Filler, MD  azelastine (ASTELIN) 0.1 % nasal spray Place 2 sprays into both nostrils 2 (two) times daily. Use in each nostril as directed  03/24/18  Yes Copland, Gay Filler, MD  b complex vitamins tablet Take 1 tablet by mouth daily.   Yes [provider]  bethanechol (URECHOLINE) 25 MG tablet Take 1 tablet (25 mg total) by mouth 3 (three) times daily. 06/11/18  Yes Copland, Gay Filler, MD  Calcium 500 MG tablet Take 600 mg by mouth daily.   Yes [provider]  Cholecalciferol (VITAMIN  D3) 2000 UNITS capsule Take 2,000 Units by mouth daily.   Yes [provider]  folic acid (FOLVITE) 1 MG tablet TAKE 2 TABLETS(2 MG) BY MOUTH DAILY Patient taking differently: Take 2 mg by mouth daily.  02/05/19  Yes Ennever, Rudell Cobb, MD  HYDROcodone-acetaminophen (NORCO) 7.5-325 MG tablet Take 1 tablet by mouth every 6 (six) hours as needed for moderate pain. 03/23/19  Yes Copland, Gay Filler, MD  Magnesium Oxide 200 MG TABS Take 200 mg by mouth daily.  05/02/11  Yes [provider]  Multiple Vitamin (MULTIVITAMIN) tablet Take 1 tablet by mouth daily.   Yes [provider]  Omega-3 Fatty Acids (OMEGA-3 FISH OIL PO) Take 2 capsules by mouth daily. EPA/DHA 520/350    Yes [provider]  polyethylene glycol (MIRALAX / GLYCOLAX) packet Take 17 g by mouth daily as needed for mild constipation.    Yes [provider]  predniSONE (DELTASONE) 20 MG tablet May take 1 tablet daily for up to 3 days as needed for gout attack or muscle spasm 01/30/19  Yes Copland, Gay Filler, MD  pregabalin (LYRICA) 25 MG capsule Take 1 capsule (25 mg total) by mouth 3 (three) times daily. 03/04/19  Yes Copland, Gay Filler, MD  psyllium (METAMUCIL) 58.6 % powder Take 1 packet by mouth daily.    Yes [provider]  UNABLE TO FIND Take 1 tablet by mouth daily. Med Name: B-6 VITAMIN    Yes [provider]  vitamin C (ASCORBIC ACID) 500 MG tablet Take 500 mg by mouth daily.   Yes [provider]  zinc gluconate 50 MG tablet Take 50 mg by mouth daily.   Yes [provider]  allopurinol (ZYLOPRIM)  300 MG tablet Take 1 tablet (300 mg total) by mouth daily. Patient not taking: Reported on 03/26/2019 12/30/18   Copland, Gay Filler, MD  gabapentin (NEURONTIN) 300 MG capsule TAKE 1 CAPSULES BY MOUTH THREE TIMES DAILY, PLUS 2 CAPSULES EVERY DAY AS NEEDED Patient not taking: Reported on 03/26/2019 02/20/19   Copland, Gay Filler, MD    Physical Exam: Vitals:   03/26/19 0515 03/26/19 0652 03/26/19 0711 03/26/19 0730  BP: 125/63 131/72  126/64  Pulse:  81  81  Resp: 16 (!) 23  (!) 22  Temp:   97.8 F (36.6 C)   TempSrc:   Oral   SpO2:  97%  97%  Weight:      Height:          General:  Appears calm and comfortable and is NAD  Eyes:  PERRL, EOMI, normal lids, iris  ENT:  Hard of hearing with hearing aids in place, normal lips & tongue, mmm  Neck:  no LAD, masses or thyromegaly  Cardiovascular:  RRR, no m/r/g. No LE edema.   Respiratory:   CTA bilaterally with no wheezes/rales/rhonchi.  Normal respiratory effort.  Abdomen:  soft, NT, ND, NABS  Skin:  no rash or induration seen on limited exam  Musculoskeletal:  grossly normal tone BUE/BLE, good ROM, no bony abnormality  Psychiatric:  grossly normal mood and affect, speech fluent and appropriate, AOx3  Neurologic:  CN 2-12 grossly intact, moves all extremities in coordinated fashion, sensation intact    Radiological Exams on Admission: Dg Chest 1 View  Result Date: 03/26/2019 CLINICAL DATA:  Chest pain. EXAM: CHEST  1 VIEW COMPARISON:  Chest CT 12/16/2017, radiograph 09/25/2017 FINDINGS: Borderline mild cardiomegaly. Unchanged mediastinal contours with aortic atherosclerosis and tortuosity. No pulmonary edema, pleural effusion,  or pneumothorax. Streaky bibasilar opacities favoring atelectasis. Remote left clavicle fracture. IMPRESSION: 1. Borderline mild cardiomegaly. 2. Streaky bibasilar opacities favoring atelectasis. Electronically Signed   By: Keith Rake M.D.   On: 03/26/2019 01:13   Ct Angio Chest Pe W And/or Wo  Contrast  Result Date: 03/26/2019 CLINICAL DATA:  Central chest pain.  Elevated D-dimer EXAM: CT ANGIOGRAPHY CHEST WITH CONTRAST TECHNIQUE: Multidetector CT imaging of the chest was performed using the standard protocol during bolus administration of intravenous contrast. Multiplanar CT image reconstructions and MIPs were obtained to evaluate the vascular anatomy. CONTRAST:  13mL OMNIPAQUE IOHEXOL 350 MG/ML SOLN COMPARISON:  Chest CT 12/16/2017 FINDINGS: Cardiovascular: Normal heart size with no pericardial effusion. Prominent aortic valve calcification. Multifocal aortic and coronary calcification. There is limited opacification of the aorta without dilatation. No pulmonary artery filling defect Mediastinum/Nodes: Are not history of lymphoma with no adenopathy seen today. Lungs/Pleura: Chronic non-specific reticulation at the lung bases associated with or accentuated by low volumes. No honeycombing. There is no edema, consolidation, or pneumothorax. Calcified granulomas in the left lung. Trace pleural fluid on the left Upper Abdomen: No acute finding Musculoskeletal: Spondylosis with multi-level bridging osteophyte. Bulky osteophytes at the open levels. No acute or aggressive finding. Review of the MIP images confirms the above findings. IMPRESSION: 1. Negative for pulmonary embolism. 2. Atherosclerosis, including the coronary arteries. 3. Aortic valve calcification. 4. Trace pleural fluid on the left. Electronically Signed   By: Monte Fantasia M.D.   On: 03/26/2019 06:04    EKG: Independently reviewed.  NSR with rate 90; nonspecific ST changes with no evidence of acute ischemia   Labs on Admission: I have personally reviewed the available labs and imaging studies at the time of the admission.  Pertinent labs:   Glucose 141 BUN 24/Creatinine 1.59/GFR 38; stable HS troponin 59, 66 WBC 8.0 Hgb 10.0 Platelets 82 - stable D-dimer 1.24   Assessment/Plan Principal Problem:   Chest pain Active  Problems:   Chronic kidney disease (CKD), stage III (moderate)   Essential (primary) hypertension   Anxiety, generalized   Combined fat and carbohydrate induced hyperlipemia   Chronic pain syndrome   Chronic myelomonocytic leukemia not having achieved remission (HCC)   Chest pain -Patient with substernal chest that came on at rest, was continuous for several hours, and resolved spontaneously. -1/3 typical symptoms suggestive of noncardiac chest pain.  -CXR unremarkable.   -Initial cardiac HS troponin mildly elevated; delta troponin was <20 -EKG not indicative of acute ischemia.   -However, he had a (negative for PE) CTA that did show coronary atherosclerosis. -He reports that his last (nuclear) stress test was about 5 years ago in [place]. -HEART pathway score is 4, indicating that the patient has an elevated risk score and requires further evaluation. -Will plan to place in observation status on telemetry to rule out ACS by overnight observation.  -Cardiology has been consulted and has asked that patient remain NPO in case of plan for stress test vs. Cath. -Start ASA 81 mg daily -No heparin for now due to fairly low suspicion for NSTEMI, but will defer to cardiology  HTN -He does not appear to be taking medications for this issue  HLD -He developed rhabdomyolysis with statin therapy and so is only taking fish oil -No FLP available, will check -Inability to take statins limits treatment options  Hyperglycemia -Mild -He is unlikely to benefit from long-term treatment of DM or suffer consequences associated with mild uncontrolled DM -Will follow with fasting labs  for now  CMML with anemia -Patient is on Aranesp injections q3 weeks for Hgb <11 -He had a prior h/o diffuse large cell NHL and completed 6 cycles of R-CHOP in [place] in April 2014. -Current CML is thought to be residual from his prior lymphoma treatment -He was actually due to f/u with Dr. Marin Walker today, and this  will need to be rescheduled.  Chronic pain -Continue Norco, Lyrica -I have reviewed this patient in the [place] Controlled Substances Reporting System.  He is receiving medications from only one provider and appears to be taking them as prescribed. -He is not at particularly high risk of opioid misuse, diversion, or overdose. -Given his age, caution would be advised about further escalating doses or adding additional medications.  Anxiety -Continue Xanax  Stage 3 CKD -Baseline creatinine appears to be 1.4-1.6 and this appears to be stable at this time -Will follow with repeat BMP in AM    Note: This patient has been tested and is pending for the novel coronavirus COVID-19 (Aptima swab).   DVT prophylaxis: Lovenox  Code Status:  DNR - confirmed with patient Family Communication: None present; I spoke with patient's Jeremiah and Jeremiah by telephone Disposition Plan:  Home once clinically improved Consults called: Cardiology  Admission status: It is my clinical opinion that referral for OBSERVATION is reasonable and necessary in this patient based on the above information provided. The aforementioned taken together are felt to place the patient at high risk for further clinical deterioration. However it is anticipated that the patient may be medically stable for discharge from the hospital within 24 to 48 hours.     Karmen Bongo MD [facility]   How to contact the [facility] Attending or Consulting provider [place] or covering provider during after hours [place], for this patient?  1. Check the care team in [facility] and look for a) attending/consulting [facility] provider listed and b) the [facility] team listed 2. Log into [email or web address] and use [facility]'s universal password to access. If you do not have the password, please contact the hospital operator. 3. Locate the [facility] provider you are looking for under [facility] and page to a number that you can be directly reached. 4. If you still have  difficulty reaching the provider, please page the [facility] (Director on Call) for the Hospitalists listed on amion for assistance.   03/26/2019, 8:41 AM

## 2019-03-26 NOTE — ED Notes (Signed)
Pt returned from [place] 

## 2019-03-27 ENCOUNTER — Encounter: Payer: Self-pay | Admitting: Family Medicine

## 2019-03-27 ENCOUNTER — Observation Stay (HOSPITAL_BASED_OUTPATIENT_CLINIC_OR_DEPARTMENT_OTHER): Payer: Medicare Other

## 2019-03-27 DIAGNOSIS — R072 Precordial pain: Secondary | ICD-10-CM | POA: Diagnosis not present

## 2019-03-27 DIAGNOSIS — I340 Nonrheumatic mitral (valve) insufficiency: Secondary | ICD-10-CM

## 2019-03-27 DIAGNOSIS — R739 Hyperglycemia, unspecified: Secondary | ICD-10-CM | POA: Diagnosis not present

## 2019-03-27 DIAGNOSIS — I350 Nonrheumatic aortic (valve) stenosis: Secondary | ICD-10-CM

## 2019-03-27 DIAGNOSIS — N183 Chronic kidney disease, stage 3 unspecified: Secondary | ICD-10-CM | POA: Diagnosis not present

## 2019-03-27 DIAGNOSIS — Z66 Do not resuscitate: Secondary | ICD-10-CM | POA: Diagnosis not present

## 2019-03-27 DIAGNOSIS — G4700 Insomnia, unspecified: Secondary | ICD-10-CM

## 2019-03-27 DIAGNOSIS — R079 Chest pain, unspecified: Secondary | ICD-10-CM

## 2019-03-27 DIAGNOSIS — E782 Mixed hyperlipidemia: Secondary | ICD-10-CM | POA: Diagnosis not present

## 2019-03-27 DIAGNOSIS — G894 Chronic pain syndrome: Secondary | ICD-10-CM | POA: Diagnosis not present

## 2019-03-27 DIAGNOSIS — D631 Anemia in chronic kidney disease: Secondary | ICD-10-CM | POA: Diagnosis not present

## 2019-03-27 DIAGNOSIS — I1310 Hypertensive heart and chronic kidney disease without heart failure, with stage 1 through stage 4 chronic kidney disease, or unspecified chronic kidney disease: Secondary | ICD-10-CM | POA: Diagnosis not present

## 2019-03-27 DIAGNOSIS — I2510 Atherosclerotic heart disease of native coronary artery without angina pectoris: Secondary | ICD-10-CM | POA: Diagnosis not present

## 2019-03-27 DIAGNOSIS — C9210 Chronic myeloid leukemia, BCR/ABL-positive, not having achieved remission: Secondary | ICD-10-CM | POA: Diagnosis not present

## 2019-03-27 DIAGNOSIS — I10 Essential (primary) hypertension: Secondary | ICD-10-CM | POA: Diagnosis not present

## 2019-03-27 DIAGNOSIS — G2581 Restless legs syndrome: Secondary | ICD-10-CM

## 2019-03-27 DIAGNOSIS — Z20828 Contact with and (suspected) exposure to other viral communicable diseases: Secondary | ICD-10-CM | POA: Diagnosis not present

## 2019-03-27 DIAGNOSIS — D696 Thrombocytopenia, unspecified: Secondary | ICD-10-CM | POA: Diagnosis not present

## 2019-03-27 LAB — ECHOCARDIOGRAM COMPLETE
Height: 69.5 in
Weight: 3208 oz

## 2019-03-27 NOTE — Progress Notes (Signed)
Progress Note  Patient Name: Jeremiah Walker Date of Encounter: 03/27/2019  Primary Cardiologist: [place]   Denies any further chest pain or dyspnea.  Inpatient Medications    Scheduled Meds:  allopurinol  100 mg Oral Daily   azelastine  2 spray Each Nare BID   bethanechol  25 mg Oral TID   enoxaparin (LOVENOX) injection  40 mg Subcutaneous X21J   folic acid  2 mg Oral Daily   pregabalin  25 mg Oral TID   psyllium  1 packet Oral Daily   Continuous Infusions:  PRN Meds: acetaminophen, ALPRAZolam, HYDROcodone-acetaminophen, ondansetron (ZOFRAN) IV, polyethylene glycol   Vital Signs    Vitals:   03/27/19 0427 03/27/19 0921 03/27/19 0943 03/27/19 0958  BP: (!) 145/68  140/75 136/73  Pulse: 97 81 84 79  Resp: 18  18 18   Temp: 98.2 F (36.8 C)  (!) 97.4 F (36.3 C) (!) 79 F (26.1 C)  TempSrc: Oral  Oral Oral  SpO2: 98%  99% 100%  Weight:      Height:        Intake/Output Summary (Last 24 hours) at 03/27/2019 1036 Last data filed at 03/27/2019 0934 Gross per 24 hour  Intake 540 ml  Output 1475 ml  Net -935 ml   Last 3 Weights 03/27/2019 03/26/2019 03/26/2019  Weight (lbs) 200 lb 8 oz 206 lb 205 lb  Weight (kg) 90.946 kg 93.441 kg 92.987 kg      Telemetry    NSR in 80s - Personally Reviewed  ECG    No new - Personally Reviewed  Physical Exam   GEN: No acute distress.   Neck: No JVD Cardiac: RRR, no murmurs, rubs, or gallops.  Respiratory: Clear to auscultation bilaterally. GI: Soft, nontender, non-distended  MS: No edema; No deformity. Neuro:  Nonfocal  Psych: Normal affect   Labs    High Sensitivity Troponin:   Recent Labs  Lab 03/26/19 0055 03/26/19 0225  TROPONINIHS 59* 66*      Chemistry Recent Labs  Lab 03/26/19 0055  NA 138  K 3.9  CL 104  CO2 27  GLUCOSE 141*  BUN 24*  CREATININE 1.59*  CALCIUM 9.1  PROT 6.6  ALBUMIN 3.4*  AST 18  ALT 15  ALKPHOS 57  BILITOT 0.8  GFRNONAA 38*  GFRAA 44*    ANIONGAP 7     Hematology Recent Labs  Lab 03/26/19 0055  WBC 8.0  RBC 2.62*  HGB 10.0*  HCT 30.3*  MCV 115.6*  MCH 38.2*  MCHC 33.0  RDW 14.7  PLT 82*    BNPNo results for input(s): BNP, PROBNP in the last 168 hours.   DDimer  Recent Labs  Lab 03/26/19 0118  DDIMER 1.24*     Radiology    Dg Chest 1 View  Result Date: 03/26/2019 CLINICAL DATA:  Chest pain. EXAM: CHEST  1 VIEW COMPARISON:  Chest CT 12/16/2017, radiograph 09/25/2017 FINDINGS: Borderline mild cardiomegaly. Unchanged mediastinal contours with aortic atherosclerosis and tortuosity. No pulmonary edema, pleural effusion, or pneumothorax. Streaky bibasilar opacities favoring atelectasis. Remote left clavicle fracture. IMPRESSION: 1. Borderline mild cardiomegaly. 2. Streaky bibasilar opacities favoring atelectasis. Electronically Signed   By: Keith Rake M.D.   On: 03/26/2019 01:13   Ct Angio Chest Pe W And/or Wo Contrast  Result Date: 03/26/2019 CLINICAL DATA:  Central chest pain.  Elevated D-dimer EXAM: CT ANGIOGRAPHY CHEST WITH CONTRAST TECHNIQUE: Multidetector CT imaging of the chest was performed using the standard protocol  during bolus administration of intravenous contrast. Multiplanar CT image reconstructions and MIPs were obtained to evaluate the vascular anatomy. CONTRAST:  61mL OMNIPAQUE IOHEXOL 350 MG/ML SOLN COMPARISON:  Chest CT 12/16/2017 FINDINGS: Cardiovascular: Normal heart size with no pericardial effusion. Prominent aortic valve calcification. Multifocal aortic and coronary calcification. There is limited opacification of the aorta without dilatation. No pulmonary artery filling defect Mediastinum/Nodes: Are not history of lymphoma with no adenopathy seen today. Lungs/Pleura: Chronic non-specific reticulation at the lung bases associated with or accentuated by low volumes. No honeycombing. There is no edema, consolidation, or pneumothorax. Calcified granulomas in the left lung. Trace pleural fluid  on the left Upper Abdomen: No acute finding Musculoskeletal: Spondylosis with multi-level bridging osteophyte. Bulky osteophytes at the open levels. No acute or aggressive finding. Review of the MIP images confirms the above findings. IMPRESSION: 1. Negative for pulmonary embolism. 2. Atherosclerosis, including the coronary arteries. 3. Aortic valve calcification. 4. Trace pleural fluid on the left. Electronically Signed   By: Monte Fantasia M.D.   On: 03/26/2019 06:04   Nm Myocar Multi W/spect W/wall Motion / Ef  Result Date: 03/26/2019  There was no ST segment deviation noted during stress.  No T wave inversion was noted during stress.  Defect 1: There is a large defect of moderate severity present in the basal anteroseptal, basal inferoseptal, basal inferior, basal inferolateral, basal anterolateral, mid inferoseptal, mid inferior, mid inferolateral, apical inferior and apical lateral location.  Findings consistent with prior myocardial infarction.  The left ventricular ejection fraction is mildly decreased (45-54%).  This is a high risk study.  No prior for comparison.  Large, severe fixed defect primarily in inferolateral/inferior walls and extending to adjacent walls, especially at base. Reduced EF with matching wall motion abnormalities. Consistent with infarct. No reversible ischemia seen.    Cardiac Studies   Stress MPI:  There was no ST segment deviation noted during stress.  No T wave inversion was noted during stress.  Defect 1: There is a large defect of moderate severity present in the basal anteroseptal, basal inferoseptal, basal inferior, basal inferolateral, basal anterolateral, mid inferoseptal, mid inferior, mid inferolateral, apical inferior and apical lateral location.  Findings consistent with prior myocardial infarction.  The left ventricular ejection fraction is mildly decreased (45-54%).  This is a high risk study.  No prior for comparison.  Patient Profile       85 y.o. male with a hx of CML, HTN, CKD III, anemia who is being seen today for the evaluation of chest pain  Assessment & Plan    Jeremiah Walker is a 85 y.o. male with a hx of CML, HTN, CKD III, anemia who is being seen today for the evaluation of chest pain at the request of Dr. Lorin Mercy.  Chest pain: symptoms are somewhat atypical, presenting several hours after activity.  HsT mildly elevated 59>>66. EKG without acute changes. Complicated situation with his CML and CKD.  Stress MPI shows fixed defect, mild systolic dysfunction - Given fixed defect with no evidence of ischemia on stress MPI, no cath planned.  Checking TTE to evaluate if WMA more likely artifact vs infarct.  Appears normal systolic function by my read  Aortic stenosis: moderate AS, will follow  HTN: reports not being on any agents currently. Blood pressures stable  HL: developed rhabo while on statin therapy in the past. LDL 85 without current therapy.   CML: with anemia and thrombocytopenia. Followed by Dr. Marin Olp through the [facility]. On aranesp q3  weeks. Hgb 10, Platelets 82,000.   CKD III: Cr at baseline on admission .  [facility] will sign off.   Medication Recommendations:  Continue current meds Other recommendations (labs, testing, etc):  None Follow up as an outpatient:  Will schedule cardiology f/u   For questions or updates, please contact [facility] Please consult [email or web address] for contact info under        Signed, Donato Heinz, MD  03/27/2019, 10:36 AM

## 2019-03-27 NOTE — Care Management Obs Status (Signed)
[place] NOTIFICATION   Patient Details  Name: Jeremiah Walker MRN: [id number] Date of Birth: Aug 04, 1927   Medicare Observation Status Notification Given:  Yes    Zenon Mayo, RN 03/27/2019, 12:28 PM

## 2019-03-27 NOTE — Discharge Summary (Addendum)
Physician Discharge Summary  [name] ATF:[id number] DOB: 1927/09/25 DOA: 03/26/2019  PCP: [name], MD  Admit date: 03/26/2019 Discharge date: 03/27/2019  Admitted From: [facility] Disposition:  same  Recommendations for Outpatient Follow-up:  1. 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 He reports that at 9pm he developed substernal chest pain.  It has resolved in the last 90 minutes.  He has back pain and had done exercises prior and thought it might be MSK; he also had 45 minutes of exercise that morning without pain.  He had pain with deep breathing.  The pain continued for about 90 minutes and decided to come in.  He thinks the pain resolved with time, spontaneously.  Hydrocodone did not help.  He felt well prior and feels good now.  He is very functional at baseline, living independently with routine exercise and he continues to drive.  In ED > CXR mild cardiomegaly, bibasilar opacities, likely atelectasis D dimer 1.24 CT chest PE> no PE- atherosclerosis noted including coronary arteries and calcifiation of Ao valve  Hospital Course:  Chest pain - High sens trop> 59 >> 66 - EKG non-acute - cholesterol 141, LDL 86 an dHDL 41 - Myoview stress test>   Defect 1: There is a large defect of moderate severity present in the basal anteroseptal, basal  inferoseptal, basal inferior, basal inferolateral, basal anterolateral, mid inferoseptal, mid inferior, mid inferolateral, apical inferior and apical lateral location.  Findings consistent with prior myocardial infarction. The left ventricular ejection fraction is mildly decreased (45-54%). 2 D ECHO: official report is still pending but Dr Gardiner Rhyme states on his read, there are not significant findings - no medication changes made by cardiology - f/u planned in a couple of weeks.  All other medical problems remained unchanged. No Medication changes made.   Discharge Exam: Vitals:   03/27/19 0958 03/27/19 1119  BP: 136/73 133/71  Pulse: 79 77  Resp: 18 18  Temp: (!) 79 F (26.1 C) 98.1 F (36.7 C)  SpO2: 100% 98%   Vitals:   03/27/19 0921 03/27/19 0943 03/27/19 0958 03/27/19 1119  BP:  140/75 136/73 133/71  Pulse: 81 84 79 77  Resp:  18 18 18   Temp:  (!) 97.4 F (36.3 C) (!) 79 F (26.1 C) 98.1 F (36.7 C)  TempSrc:  Oral Oral Oral  SpO2:  99% 100% 98%  Weight:      Height:        General: Pt is alert, awake, not in acute distress Cardiovascular: RRR, S1/S2 +, no rubs, no gallops- 2/6 murmur in RUS border Respiratory: CTA bilaterally, no wheezing, no rhonchi Abdominal: Soft, NT, ND, bowel sounds + Extremities: no edema, no cyanosis   Discharge Instructions  Discharge Instructions    Diet - low sodium heart healthy   Complete by: As directed    Increase activity slowly  Complete by: As directed      Allergies as of 03/27/2019      Reactions   Nsaids Other (See Comments), Tinitus   Bruising   Tolmetin Other (See Comments)   Bruising   Ciprofloxacin Other (See Comments)   Other reaction(s): Other (See Comments) Achilles tendon Achilles tendon Achilles tendon Achilles tendon   Statins Nausea And Vomiting, Other (See Comments)   Other reaction(s): Kidney issues Muscle pain Muscle pain Other reaction(s): Kidney issues Muscle pain Muscle pain Other  reaction(s): Kidney issues Muscle pain Muscle pain Other reaction(s): Kidney issues Muscle pain   Requip [ropinirole Hcl] Anxiety   Ropinirole Anxiety   Mood swing      Medication List    TAKE these medications   allopurinol 100 MG tablet Commonly known as: ZYLOPRIM Take 100 mg by mouth daily.   ALPRAZolam 0.5 MG tablet Commonly known as: XANAX TAKE 1 TABLET(0.5 MG) BY MOUTH THREE TIMES DAILY AS NEEDED FOR ANXIETY What changed:   how much to take  how to take this  when to take this  reasons to take this  additional instructions   amoxicillin 500 MG capsule Commonly known as: AMOXIL Take 4 caps (2gm) by mouth once prior to dental procedure   azelastine 0.1 % nasal spray Commonly known as: ASTELIN Place 2 sprays into both nostrils 2 (two) times daily. Use in each nostril as directed   b complex vitamins tablet Take 1 tablet by mouth daily.   bethanechol 25 MG tablet Commonly known as: URECHOLINE Take 1 tablet (25 mg total) by mouth 3 (three) times daily.   Calcium 500 MG tablet Take 600 mg by mouth daily.   folic acid 1 MG tablet Commonly known as: FOLVITE TAKE 2 TABLETS(2 MG) BY MOUTH DAILY What changed: See the new instructions.   HYDROcodone-acetaminophen 7.5-325 MG tablet Commonly known as: NORCO Take 1 tablet by mouth every 6 (six) hours as needed for moderate pain.   Magnesium Oxide 200 MG Tabs Take 200 mg by mouth daily.   multivitamin tablet Take 1 tablet by mouth daily.   OMEGA-3 FISH OIL PO Take 2 capsules by mouth daily. EPA/DHA 520/350   polyethylene glycol 17 g packet Commonly known as: MIRALAX / GLYCOLAX Take 17 g by mouth daily as needed for mild constipation.   predniSONE 20 MG tablet Commonly known as: DELTASONE May take 1 tablet daily for up to 3 days as needed for gout attack or muscle spasm   pregabalin 25 MG capsule Commonly known as: Lyrica Take 1 capsule (25 mg total) by mouth 3 (three) times daily.   psyllium 58.6  % powder Commonly known as: METAMUCIL Take 1 packet by mouth daily.   UNABLE TO FIND Take 1 tablet by mouth daily. Med Name: B-6 VITAMIN   vitamin C 500 MG tablet Commonly known as: ASCORBIC ACID Take 500 mg by mouth daily.   Vitamin D3 50 MCG (2000 UT) capsule Take 2,000 Units by mouth daily.   zinc gluconate 50 MG tablet Take 50 mg by mouth daily.       Allergies  Allergen Reactions  . Nsaids Other (See Comments) and Tinitus    Bruising   . Tolmetin Other (See Comments)    Bruising  . Ciprofloxacin Other (See Comments)    Other reaction(s): Other (See Comments) Achilles tendon Achilles tendon Achilles tendon Achilles tendon  . Statins Nausea And Vomiting and Other (See Comments)    Other reaction(s): Kidney issues Muscle pain Muscle pain  Other reaction(s): Kidney issues Muscle pain Muscle pain Other reaction(s): Kidney issues Muscle pain Muscle pain Other reaction(s): Kidney issues Muscle pain  . Requip [Ropinirole Hcl] Anxiety  . Ropinirole Anxiety    Mood swing     Procedures/Studies:    Dg Chest 1 View  Result Date: 03/26/2019 CLINICAL DATA:  Chest pain. EXAM: CHEST  1 VIEW COMPARISON:  Chest CT 12/16/2017, radiograph 09/25/2017 FINDINGS: Borderline mild cardiomegaly. Unchanged mediastinal contours with aortic atherosclerosis and tortuosity. No pulmonary edema, pleural effusion, or pneumothorax. Streaky bibasilar opacities favoring atelectasis. Remote left clavicle fracture. IMPRESSION: 1. Borderline mild cardiomegaly. 2. Streaky bibasilar opacities favoring atelectasis. Electronically Signed   By: Keith Rake M.D.   On: 03/26/2019 01:13   Ct Angio Chest Pe W And/or Wo Contrast  Result Date: 03/26/2019 CLINICAL DATA:  Central chest pain.  Elevated D-dimer EXAM: CT ANGIOGRAPHY CHEST WITH CONTRAST TECHNIQUE: Multidetector CT imaging of the chest was performed using the standard protocol during bolus administration of intravenous contrast.  Multiplanar CT image reconstructions and MIPs were obtained to evaluate the vascular anatomy. CONTRAST:  91mL OMNIPAQUE IOHEXOL 350 MG/ML SOLN COMPARISON:  Chest CT 12/16/2017 FINDINGS: Cardiovascular: Normal heart size with no pericardial effusion. Prominent aortic valve calcification. Multifocal aortic and coronary calcification. There is limited opacification of the aorta without dilatation. No pulmonary artery filling defect Mediastinum/Nodes: Are not history of lymphoma with no adenopathy seen today. Lungs/Pleura: Chronic non-specific reticulation at the lung bases associated with or accentuated by low volumes. No honeycombing. There is no edema, consolidation, or pneumothorax. Calcified granulomas in the left lung. Trace pleural fluid on the left Upper Abdomen: No acute finding Musculoskeletal: Spondylosis with multi-level bridging osteophyte. Bulky osteophytes at the open levels. No acute or aggressive finding. Review of the MIP images confirms the above findings. IMPRESSION: 1. Negative for pulmonary embolism. 2. Atherosclerosis, including the coronary arteries. 3. Aortic valve calcification. 4. Trace pleural fluid on the left. Electronically Signed   By: Monte Fantasia M.D.   On: 03/26/2019 06:04   Nm Myocar Multi W/spect W/wall Motion / Ef  Result Date: 03/26/2019  There was no ST segment deviation noted during stress.  No T wave inversion was noted during stress.  Defect 1: There is a large defect of moderate severity present in the basal anteroseptal, basal inferoseptal, basal inferior, basal inferolateral, basal anterolateral, mid inferoseptal, mid inferior, mid inferolateral, apical inferior and apical lateral location.  Findings consistent with prior myocardial infarction.  The left ventricular ejection fraction is mildly decreased (45-54%).  This is a high risk study.  No prior for comparison.  Large, severe fixed defect primarily in inferolateral/inferior walls and extending to adjacent  walls, especially at base. Reduced EF with matching wall motion abnormalities. Consistent with infarct. No reversible ischemia seen.      The results of significant diagnostics from this hospitalization (including imaging, microbiology, ancillary and laboratory) are listed below for reference.     Microbiology: Recent Results (from the past 240 hour(s))  SARS CORONAVIRUS 2 (TAT 6-24 HRS) Nasopharyngeal Nasopharyngeal Swab     Status: None   Collection Time: 03/26/19  5:48 AM   Specimen: Nasopharyngeal Swab  Result Value Ref Range Status   SARS Coronavirus 2 NEGATIVE NEGATIVE Final    Comment: (NOTE) SARS-CoV-2 target nucleic acids are NOT DETECTED. The SARS-CoV-2 RNA is generally detectable in upper and lower respiratory specimens during the acute phase of infection. Negative results do not preclude SARS-CoV-2 infection, do not rule out co-infections with other pathogens, and  should not be used as the sole basis for treatment or other patient management decisions. Negative results must be combined with clinical observations, patient history, and epidemiological information. The expected result is Negative. Fact Sheet for Patients: SugarRoll.be Fact Sheet for Healthcare Providers: https://www.woods-mathews.com/ This test is not yet approved or cleared by the [place] FDA and  has been authorized for detection and/or diagnosis of SARS-CoV-2 by FDA under an Emergency Use Authorization (EUA). This EUA will remain  in effect (meaning this test can be used) for the duration of the COVID-19 declaration under Section 56 4(b)(1) of the Act, 21 U.S.C. section 360bbb-3(b)(1), unless the authorization is terminated or revoked sooner. Performed at [facility], [place] [address], [place], [place] [address]      Labs: BNP (last 3 results) No results for input(s): BNP in the last 8760 hours. Basic Metabolic Panel: Recent Labs  Lab  03/26/19 0055  NA 138  K 3.9  CL 104  CO2 27  GLUCOSE 141*  BUN 24*  CREATININE 1.59*  CALCIUM 9.1   Liver Function Tests: Recent Labs  Lab 03/26/19 0055  AST 18  ALT 15  ALKPHOS 57  BILITOT 0.8  PROT 6.6  ALBUMIN 3.4*   No results for input(s): LIPASE, AMYLASE in the last 168 hours. No results for input(s): AMMONIA in the last 168 hours. CBC: Recent Labs  Lab 03/26/19 0055  WBC 8.0  NEUTROABS 3.0  HGB 10.0*  HCT 30.3*  MCV 115.6*  PLT 82*   Cardiac Enzymes: No results for input(s): CKTOTAL, CKMB, CKMBINDEX, TROPONINI in the last 168 hours. BNP: Invalid input(s): POCBNP CBG: No results for input(s): GLUCAP in the last 168 hours. D-Dimer Recent Labs    03/26/19 0118  DDIMER 1.24*   Hgb A1c No results for input(s): HGBA1C in the last 72 hours. Lipid Profile Recent Labs    03/26/19 0055  CHOL 141  HDL 41  LDLCALC 86  TRIG 69  CHOLHDL 3.4   Thyroid function studies No results for input(s): TSH, T4TOTAL, T3FREE, THYROIDAB in the last 72 hours.  Invalid input(s): FREET3 Anemia work up No results for input(s): VITAMINB12, FOLATE, FERRITIN, TIBC, IRON, RETICCTPCT in the last 72 hours. Urinalysis    Component Value Date/Time   BILIRUBINUR negative 12/11/2017 1507   KETONESUR negative 12/11/2017 1507   PROTEINUR trace (A) 12/11/2017 1507   UROBILINOGEN 1.0 12/11/2017 1507   NITRITE Negative 12/11/2017 1507   LEUKOCYTESUR Negative 12/11/2017 1507   Sepsis Labs Invalid input(s): PROCALCITONIN,  WBC,  LACTICIDVEN Microbiology Recent Results (from the past 240 hour(s))  SARS CORONAVIRUS 2 (TAT 6-24 HRS) Nasopharyngeal Nasopharyngeal Swab     Status: None   Collection Time: 03/26/19  5:48 AM   Specimen: Nasopharyngeal Swab  Result Value Ref Range Status   SARS Coronavirus 2 NEGATIVE NEGATIVE Final    Comment: (NOTE) SARS-CoV-2 target nucleic acids are NOT DETECTED. The SARS-CoV-2 RNA is generally detectable in upper and lower respiratory  specimens during the acute phase of infection. Negative results do not preclude SARS-CoV-2 infection, do not rule out co-infections with other pathogens, and should not be used as the sole basis for treatment or other patient management decisions. Negative results must be combined with clinical observations, patient history, and epidemiological information. The expected result is Negative. Fact Sheet for Patients: SugarRoll.be Fact Sheet for Healthcare Providers: https://www.woods-mathews.com/ This test is not yet approved or cleared by the [place] FDA and  has been authorized for detection and/or diagnosis of  SARS-CoV-2 by FDA under an Emergency Use Authorization (EUA). This EUA will remain  in effect (meaning this test can be used) for the duration of the COVID-19 declaration under Section 56 4(b)(1) of the Act, 21 U.S.C. section 360bbb-3(b)(1), unless the authorization is terminated or revoked sooner. Performed at [facility], [place] [address], [place], [place] [address]      Time coordinating discharge in minutes: 61  SIGNED:   Debbe Odea, MD  [facility] 03/27/2019, 11:23 AM Pager   If 7PM-7AM, please contact night-coverage [email or web address] Password [email or web address]

## 2019-03-27 NOTE — Plan of Care (Signed)
  Problem: Education: Goal: Knowledge of General Education information will improve Description: Including pain rating scale, medication(s)/side effects and non-pharmacologic comfort measures Outcome: Completed/Met   Problem: Health Behavior/Discharge Planning: Goal: Ability to manage health-related needs will improve Outcome: Completed/Met   Problem: Clinical Measurements: Goal: Ability to maintain clinical measurements within normal limits will improve Outcome: Completed/Met Goal: Will remain free from infection Outcome: Completed/Met Goal: Diagnostic test results will improve Outcome: Completed/Met Goal: Respiratory complications will improve Outcome: Completed/Met Goal: Cardiovascular complication will be avoided Outcome: Completed/Met   Problem: Activity: Goal: Risk for activity intolerance will decrease Outcome: Completed/Met   Problem: Nutrition: Goal: Adequate nutrition will be maintained Outcome: Completed/Met   Problem: Coping: Goal: Level of anxiety will decrease Outcome: Completed/Met   Problem: Elimination: Goal: Will not experience complications related to bowel motility Outcome: Completed/Met Goal: Will not experience complications related to urinary retention Outcome: Completed/Met   Problem: Pain Managment: Goal: General experience of comfort will improve Outcome: Completed/Met   Problem: Safety: Goal: Ability to remain free from injury will improve Outcome: Completed/Met   Problem: Skin Integrity: Goal: Risk for impaired skin integrity will decrease Outcome: Completed/Met   Problem: Education: Goal: Understanding of CV disease, CV risk reduction, and recovery process will improve Outcome: Completed/Met Goal: Individualized Educational Video(s) Outcome: Completed/Met   Problem: Activity: Goal: Ability to return to baseline activity level will improve Outcome: Completed/Met   Problem: Cardiovascular: Goal: Ability to achieve and maintain  adequate cardiovascular perfusion will improve Outcome: Completed/Met Goal: Vascular access site(s) Level 0-1 will be maintained Outcome: Completed/Met   Problem: Health Behavior/Discharge Planning: Goal: Ability to safely manage health-related needs after discharge will improve Outcome: Completed/Met

## 2019-03-27 NOTE — Progress Notes (Signed)
  Echocardiogram 2D Echocardiogram has been performed.  Jeremiah Walker 03/27/2019, 10:16 AM

## 2019-03-29 ENCOUNTER — Other Ambulatory Visit: Payer: Self-pay | Admitting: Family Medicine

## 2019-03-30 ENCOUNTER — Inpatient Hospital Stay: Payer: Medicare Other | Attending: Hematology & Oncology

## 2019-03-30 ENCOUNTER — Telehealth: Payer: Self-pay | Admitting: Hematology & Oncology

## 2019-03-30 ENCOUNTER — Inpatient Hospital Stay (HOSPITAL_BASED_OUTPATIENT_CLINIC_OR_DEPARTMENT_OTHER): Payer: Medicare Other | Admitting: Hematology & Oncology

## 2019-03-30 ENCOUNTER — Other Ambulatory Visit: Payer: Medicare Other

## 2019-03-30 ENCOUNTER — Other Ambulatory Visit: Payer: Self-pay

## 2019-03-30 ENCOUNTER — Encounter: Payer: Self-pay | Admitting: Hematology & Oncology

## 2019-03-30 ENCOUNTER — Inpatient Hospital Stay: Payer: Medicare Other

## 2019-03-30 VITALS — BP 133/71 | HR 72 | Temp 97.7°F | Resp 18 | Wt 208.0 lb

## 2019-03-30 DIAGNOSIS — D696 Thrombocytopenia, unspecified: Secondary | ICD-10-CM

## 2019-03-30 DIAGNOSIS — I350 Nonrheumatic aortic (valve) stenosis: Secondary | ICD-10-CM | POA: Insufficient documentation

## 2019-03-30 DIAGNOSIS — Z79899 Other long term (current) drug therapy: Secondary | ICD-10-CM | POA: Insufficient documentation

## 2019-03-30 DIAGNOSIS — R011 Cardiac murmur, unspecified: Secondary | ICD-10-CM | POA: Insufficient documentation

## 2019-03-30 DIAGNOSIS — Z881 Allergy status to other antibiotic agents status: Secondary | ICD-10-CM | POA: Diagnosis not present

## 2019-03-30 DIAGNOSIS — D6481 Anemia due to antineoplastic chemotherapy: Secondary | ICD-10-CM | POA: Diagnosis not present

## 2019-03-30 DIAGNOSIS — R5383 Other fatigue: Secondary | ICD-10-CM | POA: Insufficient documentation

## 2019-03-30 DIAGNOSIS — R05 Cough: Secondary | ICD-10-CM | POA: Diagnosis not present

## 2019-03-30 DIAGNOSIS — C9310 Chronic myelomonocytic leukemia not having achieved remission: Secondary | ICD-10-CM

## 2019-03-30 DIAGNOSIS — C8330 Diffuse large B-cell lymphoma, unspecified site: Secondary | ICD-10-CM | POA: Diagnosis not present

## 2019-03-30 DIAGNOSIS — R0789 Other chest pain: Secondary | ICD-10-CM | POA: Diagnosis not present

## 2019-03-30 DIAGNOSIS — Z888 Allergy status to other drugs, medicaments and biological substances status: Secondary | ICD-10-CM | POA: Diagnosis not present

## 2019-03-30 DIAGNOSIS — G894 Chronic pain syndrome: Secondary | ICD-10-CM

## 2019-03-30 DIAGNOSIS — Z886 Allergy status to analgesic agent status: Secondary | ICD-10-CM | POA: Insufficient documentation

## 2019-03-30 LAB — CBC WITH DIFFERENTIAL (CANCER CENTER ONLY)
Abs Immature Granulocytes: 0.53 10*3/uL — ABNORMAL HIGH (ref 0.00–0.07)
Basophils Absolute: 0 10*3/uL (ref 0.0–0.1)
Basophils Relative: 0 %
Eosinophils Absolute: 0 10*3/uL (ref 0.0–0.5)
Eosinophils Relative: 0 %
HCT: 29.3 % — ABNORMAL LOW (ref 39.0–52.0)
Hemoglobin: 9.6 g/dL — ABNORMAL LOW (ref 13.0–17.0)
Immature Granulocytes: 7 %
Lymphocytes Relative: 22 %
Lymphs Abs: 1.7 10*3/uL (ref 0.7–4.0)
MCH: 36.4 pg — ABNORMAL HIGH (ref 26.0–34.0)
MCHC: 32.8 g/dL (ref 30.0–36.0)
MCV: 111 fL — ABNORMAL HIGH (ref 80.0–100.0)
Monocytes Absolute: 2.6 10*3/uL — ABNORMAL HIGH (ref 0.1–1.0)
Monocytes Relative: 33 %
Neutro Abs: 3 10*3/uL (ref 1.7–7.7)
Neutrophils Relative %: 38 %
Platelet Count: 87 10*3/uL — ABNORMAL LOW (ref 150–400)
RBC: 2.64 MIL/uL — ABNORMAL LOW (ref 4.22–5.81)
RDW: 14.1 % (ref 11.5–15.5)
WBC Count: 7.8 10*3/uL (ref 4.0–10.5)
nRBC: 0 % (ref 0.0–0.2)

## 2019-03-30 LAB — CMP (CANCER CENTER ONLY)
ALT: 15 U/L (ref 0–44)
AST: 20 U/L (ref 15–41)
Albumin: 4 g/dL (ref 3.5–5.0)
Alkaline Phosphatase: 59 U/L (ref 38–126)
Anion gap: 10 (ref 5–15)
BUN: 33 mg/dL — ABNORMAL HIGH (ref 8–23)
CO2: 24 mmol/L (ref 22–32)
Calcium: 9.6 mg/dL (ref 8.9–10.3)
Chloride: 102 mmol/L (ref 98–111)
Creatinine: 1.58 mg/dL — ABNORMAL HIGH (ref 0.61–1.24)
GFR, Est AFR Am: 44 mL/min — ABNORMAL LOW (ref >60)
GFR, Estimated: 38 mL/min — ABNORMAL LOW (ref >60)
Glucose, Bld: 154 mg/dL — ABNORMAL HIGH (ref 70–99)
Potassium: 4.5 mmol/L (ref 3.5–5.1)
Sodium: 136 mmol/L (ref 135–145)
Total Bilirubin: 0.7 mg/dL (ref 0.3–1.2)
Total Protein: 7.3 g/dL (ref 6.5–8.1)

## 2019-03-30 LAB — RETICULOCYTES
Immature Retic Fract: 22.2 % — ABNORMAL HIGH (ref 2.3–15.9)
RBC.: 2.61 MIL/uL — ABNORMAL LOW (ref 4.22–5.81)
Retic Count, Absolute: 38.6 10*3/uL (ref 19.0–186.0)
Retic Ct Pct: 1.5 % (ref 0.4–3.1)

## 2019-03-30 LAB — SAVE SMEAR(SSMR), FOR PROVIDER SLIDE REVIEW

## 2019-03-30 MED ORDER — DARBEPOETIN ALFA 300 MCG/0.6ML IJ SOSY
PREFILLED_SYRINGE | INTRAMUSCULAR | Status: AC
  Filled 2019-03-30: qty 0.6

## 2019-03-30 MED ORDER — DARBEPOETIN ALFA 300 MCG/0.6ML IJ SOSY
300.0000 ug | PREFILLED_SYRINGE | Freq: Once | INTRAMUSCULAR | Status: AC
  Administered 2019-03-30: 300 ug via SUBCUTANEOUS

## 2019-03-30 NOTE — Progress Notes (Signed)
Hematology and Oncology Follow Up Visit  Jeremiah Walker [id number] 27-Aug-1927 85 y.o. 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 Kalter is back for follow-up.  He recently was hospitalized.  He was having some substernal chest discomfort.  He was given multiple test.  He had an echocardiogram that was done on 03/27/2019.  This showed a left ventricular ejection fraction of 60-65%.  He had some left atrial dilation.  He had moderate aortic valve stenosis.  Thankfully, there is no obvious cardiac issue.    He had a CT angiogram that was done.  This was negative for any pulmonary embolism.  He said that he could not sleep for 40 hours.  We find got home, he slept for 12 hours and felt a lot better.  He is going to go swimming today.  Where he lives, the have a very good fitness facility that includes a pool.  He has had no bleeding issues.  He has had no fever.  There has been no change in bowel or bladder habits.  He has had no leg swelling.  There has been no rashes.  Overall, his performance status is ECOG 2.   Medications:  Current Outpatient Medications:  Marland Kitchen  Melatonin 10 MG TABS, Take 20 mg by mouth. Take 20 mg daily as needed at bedtime., Disp: , Rfl:  .  allopurinol (ZYLOPRIM) 100 MG tablet, Take 100 mg by mouth daily., Disp: , Rfl:  .  ALPRAZolam (XANAX) 0.5 MG tablet, TAKE 1 TABLET(0.5 MG) BY MOUTH THREE TIMES DAILY AS NEEDED FOR ANXIETY (Patient taking differently: Take 0.5 mg by mouth 3 (three) times daily as needed for anxiety. ), Disp: 90 tablet, Rfl: 3 .  amoxicillin (AMOXIL) 500 MG capsule, Take 4 caps (2gm) by mouth once prior to dental procedure, Disp: 20 capsule, Rfl: 0 .  azelastine (ASTELIN) 0.1 % nasal spray, Place 2 sprays into both nostrils  2 (two) times daily. Use in each nostril as directed, Disp: 30 mL, Rfl: 11 .  b complex vitamins tablet, Take 1 tablet by mouth daily., Disp: , Rfl:  .  bethanechol (URECHOLINE) 25 MG tablet, Take 1 tablet (25 mg total) by mouth 3 (three) times daily., Disp: 90 tablet, Rfl: 11 .  Calcium 500 MG tablet, Take 600 mg by mouth daily., Disp: , Rfl:  .  Cholecalciferol (VITAMIN D3) 2000 UNITS capsule, Take 2,000 Units by mouth daily., Disp: , Rfl:  .  folic acid (FOLVITE) 1 MG tablet, TAKE 2 TABLETS(2 MG) BY MOUTH DAILY (Patient taking differently: Take 2 mg by mouth daily. ), Disp: 60 tablet, Rfl: 4 .  HYDROcodone-acetaminophen (NORCO) 7.5-325 MG tablet, Take 1 tablet by mouth every 6 (six) hours as needed for moderate pain., Disp: 100 tablet, Rfl: 0 .  Magnesium Oxide 200 MG TABS, Take 200 mg by mouth daily. , Disp: , Rfl:  .  Multiple Vitamin (MULTIVITAMIN) tablet, Take 1 tablet by mouth daily., Disp: , Rfl:  .  Omega-3 Fatty Acids (OMEGA-3 FISH OIL PO), Take 2 capsules by mouth daily. EPA/DHA 520/350 , Disp: , Rfl:  .  polyethylene glycol (MIRALAX / GLYCOLAX) packet, Take 17 g by mouth daily as needed for mild constipation. , Disp: , Rfl:  .  predniSONE (DELTASONE)  20 MG tablet, May take 1 tablet daily for up to 3 days as needed for gout attack or muscle spasm, Disp: 20 tablet, Rfl: 1 .  pregabalin (LYRICA) 25 MG capsule, Take 1 capsule (25 mg total) by mouth 3 (three) times daily., Disp: 90 capsule, Rfl: 3 .  psyllium (METAMUCIL) 58.6 % powder, Take 1 packet by mouth daily. , Disp: , Rfl:  .  UNABLE TO FIND, Take 1 tablet by mouth daily. Med Name: B-6 VITAMIN , Disp: , Rfl:  .  vitamin C (ASCORBIC ACID) 500 MG tablet, Take 500 mg by mouth daily., Disp: , Rfl:  .  zinc gluconate 50 MG tablet, Take 50 mg by mouth daily., Disp: , Rfl:  No current facility-administered medications for this visit.   Facility-Administered Medications Ordered in Other Visits:  .  Darbepoetin Alfa (ARANESP) injection 300  mcg, 300 mcg, Subcutaneous, Once, Ennever, Rudell Cobb, MD  Allergies:  Allergies  Allergen Reactions  . Nsaids Other (See Comments) and Tinitus    Bruising   . Tolmetin Other (See Comments)    Bruising  . Ciprofloxacin Other (See Comments)    Other reaction(s): Other (See Comments) Achilles tendon Achilles tendon Achilles tendon Achilles tendon  . Statins Nausea And Vomiting and Other (See Comments)    Other reaction(s): Kidney issues Muscle pain Muscle pain Other reaction(s): Kidney issues Muscle pain Muscle pain Other reaction(s): Kidney issues Muscle pain Muscle pain Other reaction(s): Kidney issues Muscle pain  . Requip [Ropinirole Hcl] Anxiety  . Ropinirole Anxiety    Mood swing    Past Medical History, Surgical history, Social history, and Family History were reviewed and updated.  Review of Systems: Review of Systems  Constitutional: Positive for malaise/fatigue.  HENT: Negative.   Eyes: Negative.   Respiratory: Positive for cough.   Cardiovascular: Negative.   Gastrointestinal: Negative.   Genitourinary: Negative.   Musculoskeletal: Negative.   Skin: Negative.   Neurological: Negative.   Endo/Heme/Allergies: Negative.   Psychiatric/Behavioral: Negative.      Physical Exam:  weight is 208 lb (94.3 kg). His oral temperature is 97.7 F (36.5 C). His blood pressure is 133/71 and his pulse is 72. His respiration is 18 and oxygen saturation is 97%.   Wt Readings from Last 3 Encounters:  03/30/19 208 lb (94.3 kg)  03/27/19 200 lb 8 oz (90.9 kg)  02/26/19 211 lb (95.7 kg)     Physical Exam Vitals signs reviewed.  HENT:     Head: Normocephalic and atraumatic.  Eyes:     Pupils: Pupils are equal, round, and reactive to light.  Neck:     Musculoskeletal: Normal range of motion.  Cardiovascular:     Rate and Rhythm: Normal rate and regular rhythm.     Heart sounds: Murmur present.     Comments: In listening to his cardiac exam, he has a 2/6 systolic  ejection murmur. Pulmonary:     Effort: Pulmonary effort is normal.     Breath sounds: Normal breath sounds.  Abdominal:     General: Bowel sounds are normal.     Palpations: Abdomen is soft.  Musculoskeletal: Normal range of motion.        General: No tenderness or deformity.  Lymphadenopathy:     Cervical: No cervical adenopathy.  Skin:    General: Skin is warm and dry.     Findings: No erythema or rash.  Neurological:     Mental Status: He is alert and oriented to person, place, and time.  Psychiatric:        Behavior: Behavior normal.        Thought Content: Thought content normal.        Judgment: Judgment normal.     Lab Results  Component Value Date   WBC 7.8 03/30/2019   HGB 9.6 (L) 03/30/2019   HCT 29.3 (L) 03/30/2019   MCV 111.0 (H) 03/30/2019   PLT 87 (L) 03/30/2019     Chemistry      Component Value Date/Time   NA 136 03/30/2019 1034   NA 139 05/08/2017 1123   NA 141 05/15/2016 1126   K 4.5 03/30/2019 1034   K 4.0 05/08/2017 1123   K 4.3 05/15/2016 1126   CL 102 03/30/2019 1034   CL 105 05/08/2017 1123   CO2 24 03/30/2019 1034   CO2 30 05/08/2017 1123   CO2 26 05/15/2016 1126   BUN 33 (H) 03/30/2019 1034   BUN 15 05/08/2017 1123   BUN 29.1 (H) 05/15/2016 1126   CREATININE 1.58 (H) 03/30/2019 1034   CREATININE 1.2 05/08/2017 1123   CREATININE 1.7 (H) 05/15/2016 1126   GLU 90 11/15/2014      Component Value Date/Time   CALCIUM 9.6 03/30/2019 1034   CALCIUM 9.1 05/08/2017 1123   CALCIUM 9.7 05/15/2016 1126   ALKPHOS 59 03/30/2019 1034   ALKPHOS 64 05/08/2017 1123   ALKPHOS 69 05/15/2016 1126   AST 20 03/30/2019 1034   AST 23 05/15/2016 1126   ALT 15 03/30/2019 1034   ALT 19 05/08/2017 1123   ALT 22 05/15/2016 1126   BILITOT 0.7 03/30/2019 1034   BILITOT 0.84 05/15/2016 1126         Impression and Plan: Mr. Haberer is a 85 year old white male. He had a history of diffuse large cell non-Hodgkin's lymphoma. He was treated  with 6 cycles  of R-CHOP. He got this treatment up in [place]. He completed this back in April 2014.   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 This could be a residual from his chemotherapy for his lymphoma.  We will go ahead and give him a dose of Aranesp today.    We may have to consider giving him a large dose of Aranesp to try to help with his hemoglobin.  Otherwise, I am happy that they did not find anything that was obvious bleeding with his heart.  I know he has a heart murmur which I am sure is probably the aortic stenosis.  We will plan to get him back in about 4 weeks.  I want to try to arrange his appointment so that we get him back right before the Thanksgiving holiday.     [name], MD 10/5/202012:02 PM

## 2019-03-30 NOTE — Telephone Encounter (Signed)
lmom to inform patient of 11/5 appt per 10/5 LOS

## 2019-03-30 NOTE — Patient Instructions (Signed)
Darbepoetin Alfa injection (Aranesp) O que  este medicamento? A ALFADARBEPOETINA ajuda o seu organismo a produzir mais glbulos vermelhos do sangue.  usada para tratar a anemia provocada por doena renal crnica ou quimioterapia. Este medicamento pode ser usado para outros propsitos; em caso de dvidas, pergunte ao seu profissional de sade ou farmacutico. NOMES DE MARCAS COMUNS: Aranesp O que devo dizer a meu profissional de sade antes de tomar este medicamento? Precisam saber se voc tem algum dos seguintes problemas ou estados de sade:  distrbios da coagulao sangunea ou histria pregressa de cogulos (trombose)  tem cncer e no est fazendo quimioterapia  fibrose cstica  doenas cardacas, inclusive dor no peito (angina), insuficincia cardaca ou ataque do corao prvio  hemoglobina de 12 g/dL ou acima  presso alta  carncia de cido flico, ferro ou vitamina B12  convulses  reao estranha ou alergia  alfadarbepoetina,  eritropoetina,  albumina, s protenas de hamster ou ao ltex  reao estranha ou alergia a outros medicamentos, alimentos, corantes ou conservantes  est grvida ou tentando engravidar  est amamentando Como devo usar este medicamento? Este medicamento deve ser injetado por via subcutnea ou intravenosa. Este medicamento costuma ser administrado por um profissional da sade no hospital ou em consultrio. Se este medicamento for administrado em casa, voc ser ensinado a preparar e aplicar o medicamento. Use exatamente como indicado. Tome este medicamento em intervalos regulares. No tome este medicamento com frequncia maior do que a indicada.  muito importante que as agulhas e seringas usadas sejam descartadas em um coletor especial para materiais perfurocortantes. No as coloque na lata de lixo. Se no tiver um coletor para materiais perfurocortantes, pea um a seu farmacutico ou profissional de sade. O farmacutico lhe dar um folheto  informativo especial a cada compra do medicamento. No se esquea de ler atentamente essas informaes todas as vezes. Fale com seu pediatra a respeito do uso deste medicamento em crianas. Embora este medicamento possa ser administrado em crianas a partir de 1 ms de idade para certos quadros clnicos, algumas precaues so necessrias. Superdosagem: Se achar que tomou uma superdosagem deste medicamento, entre em contato imediatamente com o [facility] ou v a um pronto-socorro. OBSERVAO: Este medicamento  s para voc. No compartilhe este medicamento com outras pessoas. E se eu deixar de tomar uma dose? Se perder uma dose, tome-a assim que possvel. Se j estiver quase na hora da sua prxima dose, tome somente essa dose. No tome o remdio em dobro, nem tome uma dose adicional. O que pode interagir com este medicamento? No tome este medicamento com nenhum dos seguintes:  alfaepoetina Esta lista pode no descrever todas as interaes possveis. D ao seu profissional de sade uma lista de todos os medicamentos, ervas medicinais, remdios de venda livre, ou suplementos alimentares que voc usa. Diga tambm se voc fuma, bebe, ou usa drogas ilcitas. Alguns destes podem interagir com o seu medicamento. Ao que devo ficar atento quando estiver usando este medicamento? Voc ser monitorado(a) atentamente enquanto estiver tomando este medicamento. Voc precisar fazer exames de sangue peridicos enquanto estiver tomando este medicamento. Este medicamento pode causar uma diminuio nos nveis de vitamina B6. Voc deve consumir vitamina B6 em quantidade suficiente enquanto estiver tomando este medicamento. Converse com seu mdico ou profissional de sade a respeito dos alimentos que consome e das vitaminas que toma. Que efeitos colaterais posso sentir aps usar este medicamento? Efeitos colaterais que devem ser informados ao seu mdico ou profissional de sade o mais rpido    possvel:  reaes alrgicas, como erupo na pele, coceira, urticria, ou inchao do rosto, dos lbios ou da lngua  dificuldade para respirar  alteraes na viso  dor no peito  confuso, dificuldade para falar ou entender os outros  sensao de tontura, desmaio, quedas  presso alta  dores musculares  dor, inchao, calor na perna  ganho rpido de peso  dor de cabea forte  dormncia ou fraqueza sbita do rosto, brao ou perna  dificuldade para andar, tontura, perda de equilbrio ou coordenao  convulses (crises convulsivas)  inchao dos tornozelos, ps ou mos  fraqueza ou cansao fora do comum Efeitos colaterais que normalmente no precisam de cuidados mdicos (avise ao seu mdico ou profissional de sade se persistirem ou forem incmodos):  diarreia  febre, calafrios (sintomas gripais)  dor de cabea  enjoo ou vmitos  vermelhido, ardncia ou inchao no local da injeo Esta lista pode no descrever todos os efeitos colaterais possveis. Para mais orientaes sobre efeitos colaterais, consulte o seu mdico. Voc pode relatar a ocorrncia de efeitos colaterais  FDA pelo telefone [phone]. Onde devo guardar meu medicamento? Manter fora do alcance das crianas. Conservar sob refrigerao, entre 2 e 8 degreesC (36 e 46 degreesF). No congelar. No agite. Descartar qualquer poro no utilizada se estiver usando um frasco-ampola de dose nica. Descartar qualquer medicamento no utilizado aps a data de validade impressa no rtulo ou embalagem. OBSERVAO: Este folheto  um resumo. Pode no cobrir todas as informaes possveis. Se tiver dvidas a respeito deste medicamento, fale com seu mdico, farmacutico ou profissional de sade.  2020 [employer] (2017-09-03 00:00:00)  

## 2019-03-31 LAB — IRON AND TIBC
Iron: 102 ug/dL (ref 42–163)
Saturation Ratios: 50 % (ref 20–55)
TIBC: 201 ug/dL — ABNORMAL LOW (ref 202–409)
UIBC: 100 ug/dL — ABNORMAL LOW (ref 117–376)

## 2019-03-31 LAB — FERRITIN: Ferritin: 531 ng/mL — ABNORMAL HIGH (ref 24–336)

## 2019-04-01 LAB — PAIN MGMT, PROFILE 8 W/CONF, U
6 Acetylmorphine: NEGATIVE ng/mL
Alcohol Metabolites: POSITIVE ng/mL — AB (ref <500)
Alphahydroxyalprazolam: 122 ng/mL
Alphahydroxymidazolam: NEGATIVE ng/mL
Alphahydroxytriazolam: NEGATIVE ng/mL
Aminoclonazepam: NEGATIVE ng/mL
Amphetamines: NEGATIVE ng/mL
Benzodiazepines: POSITIVE ng/mL
Buprenorphine, Urine: NEGATIVE ng/mL
Cocaine Metabolite: NEGATIVE ng/mL
Codeine: NEGATIVE ng/mL
Creatinine: 58.1 mg/dL
Ethyl Glucuronide (ETG): 1385 ng/mL
Ethyl Sulfate (ETS): 271 ng/mL
Hydrocodone: 781 ng/mL
Hydromorphone: 168 ng/mL
Hydroxyethylflurazepam: NEGATIVE ng/mL
Lorazepam: NEGATIVE ng/mL
MDMA: NEGATIVE ng/mL
Marijuana Metabolite: NEGATIVE ng/mL
Morphine: NEGATIVE ng/mL
Nordiazepam: NEGATIVE ng/mL
Norhydrocodone: 728 ng/mL
Opiates: POSITIVE ng/mL
Oxazepam: NEGATIVE ng/mL
Oxidant: NEGATIVE ug/mL
Oxycodone: NEGATIVE ng/mL
Temazepam: NEGATIVE ng/mL
pH: 5.4 (ref 4.5–9.0)

## 2019-04-06 ENCOUNTER — Telehealth: Payer: Self-pay | Admitting: Physical Medicine and Rehabilitation

## 2019-04-06 NOTE — Telephone Encounter (Signed)
Scheduled for 10/27 at 1000.

## 2019-04-07 ENCOUNTER — Other Ambulatory Visit: Payer: Self-pay

## 2019-04-07 NOTE — Patient Instructions (Addendum)
It was great to see you again today I sent in an rx for oxycodone for you to use for pain during your current flare up Use this OR hydrocodone, not both Please try a 1/2 tablet first - only take a whole tablet if necessary to control your pain

## 2019-04-07 NOTE — Progress Notes (Signed)
[facility] [place], [place], [place] [address] [phone]  Date:  04/08/2019   Name:  Jeremiah Walker   DOB:  1928-01-23   MRN:  [id number]  PCP:  [name], MD    Chief Complaint: Fatigue (severe fatigue until he took an iron pill) and Back Pain (left lumbar back pain, seeing dr. [name] on 10/27)   History of Present Illness:  Jeremiah Walker is a 85 y.o. 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  He is using hydrocodone for pain, in fact he took 2 today but it really did not touch his pain  He is see Laurence Spates with PM&R about his back later on this month They have used dry needling and a steroid injection the past, these were helpful  He is wearing a back brace which he has left over from the past He does water exercise three times a week  He does not generally take NSAIDs due to his platelets, but did take an aleve today as well  He does take chronic alprazolam 0.5, and also hydrocodone for his neuropathy pain and chronic back pain We recently tried stopping his chronic gabapentin and changing him to Lyrica for his neuropathy pain He thinks the lyrica works a bit better-he is taking 2 am and 1 pm.  He is up-to-date his drug screen, was done earlier this month  He saw his oncologist, Dr. Marin Olp last week.  He was given a dose of  Aranesp  He was also admitted to the hospital overnight that 2 weeks ago with chest pain- his work-up was non revealing, Myoview c/w prior MI. He has minimally decreased EF of 45%  Flu vaccine is up-to-date His kidney function is perhaps gradually worsening, with increasing creatinine to 1.5 or slightly higher over the last year.  He is followed by [facility] for this issue, he saw him most recently in June He is taking a papaya leaf extract supplement on the advice on hematology that does help with his bruising  He is seeing [facility] in a couple of weeks - they visit monthly now   Patient Active Problem List   Diagnosis Date Noted  . Chest pain 03/26/2019  . 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 Chronic pain syndrome 09/26/2015  . De Quervain's tenosynovitis, right 09/13/2015  . Anemia due to antineoplastic chemotherapy 08/15/2015  . DLBCL (diffuse large B cell lymphoma) ([place]) 08/15/2015  . Skin lesion of face 05/21/2015  . Peripheral neuropathy due to chemotherapy ([place]) 02/09/2015  . Benign prostatic hyperplasia with urinary obstruction 02/09/2015  . Bilateral hearing loss 02/09/2015  . Low back pain 02/09/2015  . Chronic kidney disease (CKD), stage III (moderate) 02/09/2015  . Essential (primary) hypertension 02/09/2015  . Gastro-esophageal reflux disease without esophagitis 02/09/2015  . Anxiety, generalized 02/09/2015  .  Cannot sleep 02/09/2015  . Combined fat and carbohydrate induced hyperlipemia 02/09/2015    Past Medical History:  Diagnosis Date  . Anemia   . Anemia due to antineoplastic chemotherapy 08/15/2015  . Anxiety   . Arthritis   . Cataract   . Chronic kidney disease (CKD), stage III (moderate) 02/09/2015   Controlled  . CMML (chronic myelomonocytic leukemia) ([place])    gets Aranesp about monthly, otherwise surveillance with Dr. Marin Olp  . Combined fat and carbohydrate induced hyperlipemia 02/09/2015   Controlled   . Depression   . DLBCL (diffuse large B cell lymphoma) ([place]) 08/15/2015  . Essential (primary) hypertension 02/09/2015  . Gastro-esophageal reflux disease without esophagitis 02/09/2015   Controlled  . Tinnitus     Past Surgical History:  Procedure Laterality Date  . CATARACT EXTRACTION, BILATERAL    . COLONOSCOPY  May 2011  . CYSTOSCOPY    . TOTAL HIP ARTHROPLASTY  2003   Right   . TOTAL HIP ARTHROPLASTY  April, 2011   Left  . TRIGGER FINGER RELEASE  Nov 2013  . TURBINECTOMY  2006  . UPPER GI ENDOSCOPY  Nov 2015    Social History   Tobacco Use  . Smoking status: Former Research scientist (life sciences)  . Smokeless tobacco: Never Used  . Tobacco comment: Quit >50 years ago  Substance Use Topics  . Alcohol use: Yes    Alcohol/week: 1.0 - 4.0 standard drinks    Types: 1 - 4 Shots of liquor per week    Comment: 4 glasses per week  . Drug use: No    Family History  Problem Relation Age of Onset  . Anuerysm Mother 59       Deceased  . Colon cancer Father 46       Deceased  . Colon cancer Brother   . Prostate cancer Brother        Deceased  . Alzheimer's disease Brother   . Heart disease Brother   . Alzheimer's disease Paternal Grandmother   . Early death Maternal Grandmother        Unknown  . Obesity Son   . Obesity Son        Gastric Bypass  . Healthy Son   . Healthy Daughter   . Diabetes Neg Hx     Allergies  Allergen Reactions  . Nsaids Other (See Comments) and Tinitus    Bruising   . Tolmetin Other (See Comments)    Bruising  . Ciprofloxacin Other (See Comments)    Other reaction(s): Other (See Comments) Achilles tendon Achilles tendon Achilles tendon Achilles tendon  . Nitroglycerin   . Statins Nausea And Vomiting and Other (See Comments)    Other reaction(s): Kidney issues Muscle pain Muscle pain Other reaction(s): Kidney issues Muscle pain Muscle pain Other reaction(s): Kidney issues Muscle pain Muscle pain Other reaction(s): Kidney issues Muscle pain  .  Requip [Ropinirole Hcl] Anxiety  . Ropinirole Anxiety    Mood swing    Medication list has been reviewed and updated.  Current Outpatient Medications on File Prior to Visit  Medication Sig Dispense Refill  . allopurinol (ZYLOPRIM) 100 MG tablet TAKE 2 TABLETS(200 MG) BY MOUTH DAILY 180 tablet 1  . ALPRAZolam (XANAX) 0.5 MG tablet TAKE 1 TABLET(0.5 MG) BY MOUTH THREE TIMES DAILY AS NEEDED FOR ANXIETY (Patient taking differently: Take 0.5 mg by mouth 3 (three) times daily as needed for anxiety. ) 90 tablet 3  . amoxicillin (AMOXIL) 500 MG capsule Take 4 caps (2gm) by mouth once  prior to dental procedure 20 capsule 0  . azelastine (ASTELIN) 0.1 % nasal spray Place 2 sprays into both nostrils 2 (two) times daily. Use in each nostril as directed 30 mL 11  . b complex vitamins tablet Take 1 tablet by mouth daily.    . bethanechol (URECHOLINE) 25 MG tablet TAKE 1 TABLET(25 MG) BY MOUTH THREE TIMES DAILY 90 tablet 11  . Calcium 500 MG tablet Take 600 mg by mouth daily.    . Cholecalciferol (VITAMIN D3) 2000 UNITS capsule Take 2,000 Units by mouth daily.    . folic acid (FOLVITE) 1 MG tablet TAKE 2 TABLETS(2 MG) BY MOUTH DAILY (Patient taking differently: Take 2 mg by mouth daily. ) 60 tablet 4  . HYDROcodone-acetaminophen (NORCO) 7.5-325 MG tablet Take 1 tablet by mouth every 6 (six) hours as needed for moderate pain. 100 tablet 0  . Magnesium Oxide 200 MG TABS Take 200 mg by mouth daily.     . Melatonin 10 MG TABS Take 20 mg by mouth. Take 20 mg daily as needed at bedtime.    . Multiple Vitamin (MULTIVITAMIN) tablet Take 1 tablet by mouth daily.    . Omega-3 Fatty Acids (OMEGA-3 FISH OIL PO) Take 2 capsules by mouth daily. EPA/DHA 520/350     . polyethylene glycol (MIRALAX / GLYCOLAX) packet Take 17 g by mouth daily as needed for mild constipation.     . predniSONE (DELTASONE) 20 MG tablet May take 1 tablet daily for up to 3 days as needed for gout attack or muscle spasm 20 tablet 1  . pregabalin  (LYRICA) 25 MG capsule Take 1 capsule (25 mg total) by mouth 3 (three) times daily. 90 capsule 3  . psyllium (METAMUCIL) 58.6 % powder Take 1 packet by mouth daily.     Marland Kitchen UNABLE TO FIND Take 1 tablet by mouth daily. Med Name: B-6 VITAMIN     . vitamin C (ASCORBIC ACID) 500 MG tablet Take 500 mg by mouth daily.    Marland Kitchen zinc gluconate 50 MG tablet Take 50 mg by mouth daily.     No current facility-administered medications on file prior to visit.     Review of Systems:  As per HPI- otherwise negative.  No fever or chills Physical Examination: Vitals:   04/08/19 0944  BP: 110/82  Pulse: 80  Resp: 18  Temp: (!) 97.3 F (36.3 C)  SpO2: 99%   Vitals:   04/08/19 0944  Weight: 210 lb (95.3 kg)  Height: 5' 9.5" (1.765 m)   Body mass index is 30.57 kg/m. Ideal Body Weight: Weight in (lb) to have BMI = 25: 171.4  GEN: WDWN, NAD, Non-toxic, A & O x 3, obese,  Well appearing elderly man  HEENT: Atraumatic, Normocephalic. Neck supple. No masses, No LAD. Ears and Nose: No external deformity. CV: RRR, No M/G/R. No JVD. No thrill. No extra heart sounds. PULM: CTA B, no wheezes, crackles, rhonchi. No retractions. No resp. distress. No accessory muscle use. ABD: S, NT, ND, +BS. No rebound. No HSM. EXTR: No c/c/e NEURO Normal gait for pt, uses a cane Normal BLE strength and sensation, DTR  PSYCH: Normally interactive. Conversant. Not depressed or anxious appearing.  Calm demeanor.  Wearing a high-quality back brace, which he states he has had for several years He notes tenderness in the left paraspinous lumbar musculature.  No bony tenderness to palpation No skin findings or vesicles  BP Readings from Last 3 Encounters:  04/08/19 110/82  03/30/19 133/71  03/27/19 133/71    Assessment and Plan: Chronic left-sided low back pain without sciatica - Plan: oxyCODONE-acetaminophen (PERCOCET) 10-325 MG tablet  [place] is taking hydrocodone 7.5 at baseline for his back pain.  It is not  controlling his current exacerbation  We discussed adding a muscle relaxer, but I think it would be less problematic to get him a temporary supply of oxycodone to use in place of hydrocodone for more intense pain.  Patient is in agreement with this.  I prescribed oxycodone 10, instructed to take half tablet unless pain is very severe, in which case he can take a whole tablet He is to take this or hydrocodone, not both Continue regular follow-up with Dr. Marin Olp Let me know if symptoms do not improve soon, he is scheduled to see his physical medicine and rehab provider in about 2 weeks Meds ordered this encounter  Medications  . oxyCODONE-acetaminophen (PERCOCET) 10-325 MG tablet    Sig: Take 0.5-1 tablets by mouth every 8 (eight) hours as needed for up to 5 days for pain.    Dispense:  15 tablet    Refill:  0    Pt will take in place of chronic hydrocodone temporarily for pain exacerbation.    Signed Lamar Blinks, MD

## 2019-04-08 ENCOUNTER — Encounter: Payer: Self-pay | Admitting: Family Medicine

## 2019-04-08 ENCOUNTER — Other Ambulatory Visit: Payer: Self-pay

## 2019-04-08 ENCOUNTER — Ambulatory Visit (INDEPENDENT_AMBULATORY_CARE_PROVIDER_SITE_OTHER): Payer: Medicare Other | Admitting: Family Medicine

## 2019-04-08 VITALS — BP 110/82 | HR 80 | Temp 97.3°F | Resp 18 | Ht 69.5 in | Wt 210.0 lb

## 2019-04-08 DIAGNOSIS — G8929 Other chronic pain: Secondary | ICD-10-CM

## 2019-04-08 DIAGNOSIS — M545 Low back pain, unspecified: Secondary | ICD-10-CM

## 2019-04-08 MED ORDER — OXYCODONE-ACETAMINOPHEN 10-325 MG PO TABS: 0.5000 | ORAL_TABLET | Freq: Three times a day (TID) | ORAL | 0 refills | Status: AC | PRN

## 2019-04-21 ENCOUNTER — Ambulatory Visit: Payer: Medicare Other | Admitting: Physical Medicine and Rehabilitation

## 2019-04-27 ENCOUNTER — Encounter: Payer: Self-pay | Admitting: Cardiology

## 2019-04-27 ENCOUNTER — Other Ambulatory Visit: Payer: Self-pay

## 2019-04-27 ENCOUNTER — Ambulatory Visit (INDEPENDENT_AMBULATORY_CARE_PROVIDER_SITE_OTHER): Payer: Medicare Other | Admitting: Cardiology

## 2019-04-27 VITALS — BP 116/63 | HR 75 | Ht 69.5 in | Wt 207.8 lb

## 2019-04-27 DIAGNOSIS — E785 Hyperlipidemia, unspecified: Secondary | ICD-10-CM | POA: Diagnosis not present

## 2019-04-27 DIAGNOSIS — I350 Nonrheumatic aortic (valve) stenosis: Secondary | ICD-10-CM | POA: Diagnosis not present

## 2019-04-27 DIAGNOSIS — R079 Chest pain, unspecified: Secondary | ICD-10-CM | POA: Diagnosis not present

## 2019-04-27 DIAGNOSIS — I10 Essential (primary) hypertension: Secondary | ICD-10-CM

## 2019-04-27 NOTE — Patient Instructions (Signed)
Medication Instructions:  Your physician recommends that you continue on your current medications as directed. Please refer to the Current Medication list given to you today.  *If you need a refill on your cardiac medications before your next appointment, please call your pharmacy*  Lab Work: NONE   Testing/Procedures: Your physician has requested that you have an echocardiogram. Echocardiography is a painless test that uses sound waves to create images of your heart. It provides your doctor with information about the size and shape of your heart and how well your heart's chambers and valves are working. This procedure takes approximately one hour. There are no restrictions for this procedure. [facility] [address]  TO BE DONE IN 1 YEAR AND SEE DR [facility] FEW DAYS LATER   Follow-Up: At [facility], you and your health needs are our priority.  As part of our continuing mission to provide you with exceptional heart care, we have created designated Provider Care Teams.  These Care Teams include your primary Cardiologist (physician) and Advanced Practice Providers (APPs -  Physician Assistants and Nurse Practitioners) who all work together to provide you with the care you need, when you need it.  Your next appointment:   12 months  You will receive a reminder letter in the mail two months in advance. If you don't receive a letter, please call our office to schedule the follow-up appointment.  The format for your next appointment:   In Person  Provider:   You may see DR [employer]  or one of the following Advanced Practice Providers on your designated Care Team:    Rosaria Ferries, PA-C  Jory Sims, DNP, ANP  Cadence Kathlen Mody, NP

## 2019-04-27 NOTE — Progress Notes (Signed)
Cardiology Office Note:    Date:  04/27/2019   ID:  Jeremiah Walker, DOB [birth year], MRN [id number]  PCP:  [name], MD  Cardiologist:  No primary care provider on file.  Electrophysiologist:  None   Referring MD: [name], MD   Chief Complaint  Patient presents with  . Aortic Stenosis    History of Present Illness:    Jeremiah Walker is a 85 y.o. male with a hx of CML, CKD stage III, hypertension who presents for hospital follow-up for chest pain.  He was admitted to [facility] for chest pain on 03/26/2019.  He reported that he had been doing water aerobics and while he had no chest pain while exercising, that evening he was doing stretches on the floor and began having substernal chest pain.  Pain lasted from 9 PM that night until 7 AM the following morning.  EKG showed no acute ST/T abnormalities.  Labs showed mild elevation in high-sensitivity troponin (56->66).  He had a CTPA which showed no evidence of PE but did note coronary artery atherosclerosis.  Nuclear stress test was done, which showed no ischemia but did show a large fixed defect suggestive of prior infarct.  TTE was done which showed normal ejection fraction, suggesting perfusion defect on stress test was likely artifact.  TTE was also notable for moderate aortic stenosis.  He reports that since his discharge from the hospital he has had one episode of chest pain.  Occurred 1 week ago, describes dull pain, 3-4 out of 10 in intensity.  It occurred at rest.  He has not been doing water aerobics due to pain in his hips.  He denies any lightheadedness, syncope, dyspnea, palpitations.    Past Medical History:  Diagnosis Date  . Anemia   . Anemia due to antineoplastic chemotherapy 08/15/2015  . Anxiety   . Arthritis   . Cataract   . Chronic kidney disease (CKD), stage III (moderate) 02/09/2015   Controlled  . CMML (chronic myelomonocytic leukemia) ([place])    gets Aranesp about monthly, otherwise surveillance with  Dr. Marin Olp  . Combined fat and carbohydrate induced hyperlipemia 02/09/2015   Controlled  . Depression   . DLBCL (diffuse large B cell lymphoma) ([place]) 08/15/2015  . Essential (primary) hypertension 02/09/2015  . Gastro-esophageal reflux disease without esophagitis 02/09/2015   Controlled  . Tinnitus     Past Surgical History:  Procedure Laterality Date  . CATARACT EXTRACTION, BILATERAL    . COLONOSCOPY  May 2011  . CYSTOSCOPY    . TOTAL HIP ARTHROPLASTY  2003   Right   . TOTAL HIP ARTHROPLASTY  April, 2011   Left  . TRIGGER FINGER RELEASE  Nov 2013  . TURBINECTOMY  2006  . UPPER GI ENDOSCOPY  Nov 2015    Current Medications: Current Meds  Medication Sig  . allopurinol (ZYLOPRIM) 100 MG tablet TAKE 2 TABLETS(200 MG) BY MOUTH DAILY  . ALPRAZolam (XANAX) 0.5 MG tablet TAKE 1 TABLET(0.5 MG) BY MOUTH THREE TIMES DAILY AS NEEDED FOR ANXIETY (Patient taking differently: Take 0.5 mg by mouth 3 (three) times daily as needed for anxiety. )  . amoxicillin (AMOXIL) 500 MG capsule Take 4 caps (2gm) by mouth once prior to dental procedure  . azelastine (ASTELIN) 0.1 % nasal spray Place 2 sprays into both nostrils 2 (two) times daily. Use in each nostril as directed  . b complex vitamins tablet Take 1 tablet by mouth daily.  . bethanechol (URECHOLINE) 25 MG tablet  TAKE 1 TABLET(25 MG) BY MOUTH THREE TIMES DAILY  . Calcium 500 MG tablet Take 600 mg by mouth daily.  . Cholecalciferol (VITAMIN D3) 2000 UNITS capsule Take 2,000 Units by mouth daily.  . folic acid (FOLVITE) 1 MG tablet TAKE 2 TABLETS(2 MG) BY MOUTH DAILY (Patient taking differently: Take 2 mg by mouth daily. )  . HYDROcodone-acetaminophen (NORCO) 7.5-325 MG tablet Take 1 tablet by mouth every 6 (six) hours as needed for moderate pain.  . Magnesium Oxide 200 MG TABS Take 200 mg by mouth daily.   . Melatonin 10 MG TABS Take 20 mg by mouth. Take 20 mg daily as needed at bedtime.  . Multiple Vitamin (MULTIVITAMIN) tablet Take 1 tablet  by mouth daily.  . Omega-3 Fatty Acids (OMEGA-3 FISH OIL PO) Take 2 capsules by mouth daily. EPA/DHA 520/350   . polyethylene glycol (MIRALAX / GLYCOLAX) packet Take 17 g by mouth daily as needed for mild constipation.   . predniSONE (DELTASONE) 20 MG tablet May take 1 tablet daily for up to 3 days as needed for gout attack or muscle spasm  . pregabalin (LYRICA) 25 MG capsule Take 1 capsule (25 mg total) by mouth 3 (three) times daily.  . psyllium (METAMUCIL) 58.6 % powder Take 1 packet by mouth daily.   Marland Kitchen UNABLE TO FIND Take 1 tablet by mouth daily. Med Name: B-6 VITAMIN   . vitamin C (ASCORBIC ACID) 500 MG tablet Take 500 mg by mouth daily.  Marland Kitchen zinc gluconate 50 MG tablet Take 50 mg by mouth daily.     Allergies:   Nsaids, Tolmetin, Ciprofloxacin, Nitroglycerin, Statins, Requip [ropinirole hcl], and Ropinirole   Social History   Socioeconomic History  . Marital status: Widowed    Spouse name: Not on file  . Number of children: Not on file  . Years of education: Not on file  . Highest education level: Not on file  Occupational History  . Not on file  Social Needs  . Financial resource strain: Not on file  . Food insecurity    Worry: Not on file    Inability: Not on file  . Transportation needs    Medical: Not on file    Non-medical: Not on file  Tobacco Use  . Smoking status: Former Research scientist (life sciences)  . Smokeless tobacco: Never Used  . Tobacco comment: Quit >50 years ago  Substance and Sexual Activity  . Alcohol use: Yes    Alcohol/week: 1.0 - 4.0 standard drinks    Types: 1 - 4 Shots of liquor per week    Comment: 4 glasses per week  . Drug use: No  . Sexual activity: Not on file  Lifestyle  . Physical activity    Days per week: Not on file    Minutes per session: Not on file  . Stress: Not on file  Relationships  . Social Herbalist on phone: Not on file    Gets together: Not on file    Attends religious service: Not on file    Active member of club or  organization: Not on file    Attends meetings of clubs or organizations: Not on file    Relationship status: Not on file  Other Topics Concern  . Not on file  Social History Narrative  . Not on file     Family History: The patient's family history includes Alzheimer's disease in his brother and paternal grandmother; Anuerysm (age of onset: 57) in his mother; Colon cancer  in his brother; Colon cancer (age of onset: 65) in his father; Early death in his maternal grandmother; Healthy in his daughter and son; Heart disease in his brother; Obesity in his son and son; Prostate cancer in his brother. There is no history of Diabetes.  ROS:   Please see the history of present illness.     All other systems reviewed and are negative.  EKGs/Labs/Other Studies Reviewed:    The following studies were reviewed today:   EKG:  EKG is not ordered today  Stress MPI 03/26/19:  There was no ST segment deviation noted during stress.  No T wave inversion was noted during stress.  Defect 1: There is a large defect of moderate severity present in the basal anteroseptal, basal inferoseptal, basal inferior, basal inferolateral, basal anterolateral, mid inferoseptal, mid inferior, mid inferolateral, apical inferior and apical lateral location.  Findings consistent with prior myocardial infarction.  The left ventricular ejection fraction is mildly decreased (45-54%).  This is a high risk study.  No prior for comparison.   Large, severe fixed defect primarily in inferolateral/inferior walls and extending to adjacent walls, especially at base. Reduced EF with matching wall motion abnormalities. Consistent with infarct. No reversible ischemia seen.  TTE 03/27/19:  1. Left ventricular ejection fraction, by visual estimation, is 60 to 65%. The left ventricle has normal function. There is moderately increased left ventricular hypertrophy.  2. Elevated left atrial and left ventricular end-diastolic pressures.   3. Left ventricular diastolic Doppler parameters are consistent with impaired relaxation pattern of LV diastolic filling.  4. Global right ventricle has normal systolic function.The right ventricular size is normal. No increase in right ventricular wall thickness.  5. Left atrial size was moderately dilated.  6. Right atrial size was normal.  7. Moderate mitral annular calcification.  8. The mitral valve is abnormal. Mild mitral valve regurgitation.  9. The tricuspid valve is grossly normal. Tricuspid valve regurgitation is trivial. 10. The aortic valve is tricuspid Aortic valve regurgitation was not visualized by color flow Doppler. Moderate aortic valve stenosis. 11. The pulmonic valve was grossly normal. Pulmonic valve regurgitation is trivial by color flow Doppler.   Recent Labs: 03/30/2019: ALT 15; BUN 33; Creatinine 1.58; Hemoglobin 9.6; Platelet Count 87; Potassium 4.5; Sodium 136  Recent Lipid Panel    Component Value Date/Time   CHOL 141 03/26/2019 0055   TRIG 69 03/26/2019 0055   HDL 41 03/26/2019 0055   CHOLHDL 3.4 03/26/2019 0055   VLDL 14 03/26/2019 0055   LDLCALC 86 03/26/2019 0055    Physical Exam:    VS:  BP 116/63   Pulse 75   Ht 5' 9.5" (1.765 m)   Wt 207 lb 12.8 oz (94.3 kg)   BMI 30.25 kg/m     Wt Readings from Last 3 Encounters:  04/27/19 207 lb 12.8 oz (94.3 kg)  04/08/19 210 lb (95.3 kg)  03/30/19 208 lb (94.3 kg)     GEN:  in no acute distress HEENT: Normal NECK: No JVD LYMPHATICS: No lymphadenopathy CARDIAC: RRR, 3/6 systolic murmur loudest at RUSB RESPIRATORY:  Clear to auscultation without rales, wheezing or rhonchi  ABDOMEN: Soft, non-tender, non-distended MUSCULOSKELETAL:  No edema; No deformity  SKIN: Warm and dry NEUROLOGIC:  Alert and oriented x 3 PSYCHIATRIC:  Normal affect   ASSESSMENT:    1. Aortic stenosis, moderate   2. Chest pain of uncertain etiology   3. Essential hypertension   4. Hyperlipidemia, unspecified  hyperlipidemia type    PLAN:  In order of problems listed above:  Aortic stenosis: moderate AS, will follow with annual echo  Chest pain:atypical symptoms.  Stress MPI shows fixed defect, mild systolic dysfunction.  TTE showed normal EF, suggesting likely artifact on stress MPI  HTN:not on any agents currently. Blood pressures stable  HLD: developed rhabo while on statin therapy in the past. LDL 85 without current therapy.   HLK:TGYB anemia and thrombocytopenia. Followed by Dr. Marin Olp through [facility]. On aranesp q3 weeks.   CKD III:Cr 1.58 on 03/30/19  RTC in 1 year   Medication Adjustments/Labs and Tests Ordered: Current medicines are reviewed at length with the patient today.  Concerns regarding medicines are outlined above.  Orders Placed This Encounter  Procedures  . ECHOCARDIOGRAM COMPLETE   No orders of the defined types were placed in this encounter.   Patient Instructions  Medication Instructions:  Your physician recommends that you continue on your current medications as directed. Please refer to the Current Medication list given to you today.  *If you need a refill on your cardiac medications before your next appointment, please call your pharmacy*  Lab Work: NONE   Testing/Procedures: Your physician has requested that you have an echocardiogram. Echocardiography is a painless test that uses sound waves to create images of your heart. It provides your doctor with information about the size and shape of your heart and how well your heart's chambers and valves are working. This procedure takes approximately one hour. There are no restrictions for this procedure. [facility] [place] [address]  TO BE DONE IN 1 YEAR AND SEE DR [facility] FEW DAYS LATER   Follow-Up: At [facility], you and your health needs are our priority.  As part of our continuing mission to provide you with exceptional heart care, we have created designated Provider  Care Teams.  These Care Teams include your primary Cardiologist (physician) and Advanced Practice Providers (APPs -  Physician Assistants and Nurse Practitioners) who all work together to provide you with the care you need, when you need it.  Your next appointment:   12 months  You will receive a reminder letter in the mail two months in advance. If you don't receive a letter, please call our office to schedule the follow-up appointment.  The format for your next appointment:   In Person  Provider:   You may see DR [facility]  or one of the following Advanced Practice Providers on your designated Care Team:    Rosaria Ferries, PA-C  Jory Sims, DNP, ANP  Cadence Kathlen Mody, NP       Signed, Donato Heinz, MD  04/27/2019 12:19 PM    [place]

## 2019-04-30 ENCOUNTER — Inpatient Hospital Stay: Payer: Medicare Other

## 2019-04-30 ENCOUNTER — Inpatient Hospital Stay: Payer: Medicare Other | Attending: Hematology & Oncology | Admitting: Hematology & Oncology

## 2019-04-30 ENCOUNTER — Other Ambulatory Visit: Payer: Self-pay

## 2019-04-30 ENCOUNTER — Encounter: Payer: Self-pay | Admitting: Hematology & Oncology

## 2019-04-30 VITALS — BP 104/57 | HR 90 | Temp 98.0°F | Resp 18 | Wt 208.2 lb

## 2019-04-30 DIAGNOSIS — C9310 Chronic myelomonocytic leukemia not having achieved remission: Secondary | ICD-10-CM

## 2019-04-30 DIAGNOSIS — R5383 Other fatigue: Secondary | ICD-10-CM | POA: Diagnosis not present

## 2019-04-30 DIAGNOSIS — R58 Hemorrhage, not elsewhere classified: Secondary | ICD-10-CM | POA: Insufficient documentation

## 2019-04-30 DIAGNOSIS — D691 Qualitative platelet defects: Secondary | ICD-10-CM | POA: Insufficient documentation

## 2019-04-30 DIAGNOSIS — Z79899 Other long term (current) drug therapy: Secondary | ICD-10-CM | POA: Diagnosis not present

## 2019-04-30 DIAGNOSIS — D6481 Anemia due to antineoplastic chemotherapy: Secondary | ICD-10-CM | POA: Diagnosis not present

## 2019-04-30 DIAGNOSIS — R05 Cough: Secondary | ICD-10-CM | POA: Diagnosis not present

## 2019-04-30 DIAGNOSIS — Z886 Allergy status to analgesic agent status: Secondary | ICD-10-CM | POA: Diagnosis not present

## 2019-04-30 DIAGNOSIS — Z888 Allergy status to other drugs, medicaments and biological substances status: Secondary | ICD-10-CM | POA: Diagnosis not present

## 2019-04-30 DIAGNOSIS — Z881 Allergy status to other antibiotic agents status: Secondary | ICD-10-CM | POA: Diagnosis not present

## 2019-04-30 DIAGNOSIS — C8330 Diffuse large B-cell lymphoma, unspecified site: Secondary | ICD-10-CM

## 2019-04-30 LAB — CBC WITH DIFFERENTIAL (CANCER CENTER ONLY)
Abs Immature Granulocytes: 0.08 10*3/uL — ABNORMAL HIGH (ref 0.00–0.07)
Basophils Absolute: 0 10*3/uL (ref 0.0–0.1)
Basophils Relative: 1 %
Eosinophils Absolute: 0.1 10*3/uL (ref 0.0–0.5)
Eosinophils Relative: 1 %
HCT: 28.5 % — ABNORMAL LOW (ref 39.0–52.0)
Hemoglobin: 9.1 g/dL — ABNORMAL LOW (ref 13.0–17.0)
Immature Granulocytes: 1 %
Lymphocytes Relative: 31 %
Lymphs Abs: 1.9 10*3/uL (ref 0.7–4.0)
MCH: 36 pg — ABNORMAL HIGH (ref 26.0–34.0)
MCHC: 31.9 g/dL (ref 30.0–36.0)
MCV: 112.6 fL — ABNORMAL HIGH (ref 80.0–100.0)
Monocytes Absolute: 2.5 10*3/uL — ABNORMAL HIGH (ref 0.1–1.0)
Monocytes Relative: 41 %
Neutro Abs: 1.5 10*3/uL — ABNORMAL LOW (ref 1.7–7.7)
Neutrophils Relative %: 25 %
Platelet Count: 104 10*3/uL — ABNORMAL LOW (ref 150–400)
RBC: 2.53 MIL/uL — ABNORMAL LOW (ref 4.22–5.81)
RDW: 14.8 % (ref 11.5–15.5)
WBC Count: 6.1 10*3/uL (ref 4.0–10.5)
nRBC: 0 % (ref 0.0–0.2)

## 2019-04-30 LAB — CMP (CANCER CENTER ONLY)
ALT: 10 U/L (ref 0–44)
AST: 16 U/L (ref 15–41)
Albumin: 4.1 g/dL (ref 3.5–5.0)
Alkaline Phosphatase: 54 U/L (ref 38–126)
Anion gap: 8 (ref 5–15)
BUN: 28 mg/dL — ABNORMAL HIGH (ref 8–23)
CO2: 27 mmol/L (ref 22–32)
Calcium: 9.1 mg/dL (ref 8.9–10.3)
Chloride: 105 mmol/L (ref 98–111)
Creatinine: 1.61 mg/dL — ABNORMAL HIGH (ref 0.61–1.24)
GFR, Est AFR Am: 43 mL/min — ABNORMAL LOW (ref >60)
GFR, Estimated: 37 mL/min — ABNORMAL LOW (ref >60)
Glucose, Bld: 115 mg/dL — ABNORMAL HIGH (ref 70–99)
Potassium: 4.2 mmol/L (ref 3.5–5.1)
Sodium: 140 mmol/L (ref 135–145)
Total Bilirubin: 0.9 mg/dL (ref 0.3–1.2)
Total Protein: 6.9 g/dL (ref 6.5–8.1)

## 2019-04-30 LAB — RETICULOCYTES
Immature Retic Fract: 17.1 % — ABNORMAL HIGH (ref 2.3–15.9)
RBC.: 2.54 MIL/uL — ABNORMAL LOW (ref 4.22–5.81)
Retic Count, Absolute: 33.8 10*3/uL (ref 19.0–186.0)
Retic Ct Pct: 1.3 % (ref 0.4–3.1)

## 2019-04-30 LAB — LACTATE DEHYDROGENASE: LDH: 217 U/L — ABNORMAL HIGH (ref 98–192)

## 2019-04-30 MED ORDER — DARBEPOETIN ALFA 300 MCG/0.6ML IJ SOSY
PREFILLED_SYRINGE | INTRAMUSCULAR | Status: AC
  Filled 2019-04-30: qty 0.6

## 2019-04-30 MED ORDER — DARBEPOETIN ALFA 500 MCG/ML IJ SOSY
500.0000 ug | PREFILLED_SYRINGE | Freq: Once | INTRAMUSCULAR | Status: AC
  Administered 2019-04-30: 500 ug via SUBCUTANEOUS

## 2019-04-30 NOTE — Progress Notes (Signed)
Hematology and Oncology Follow Up Visit  Jeremiah Walker [id number] May 26, 1928 85 y.o. 04/30/2019   Principle Diagnosis:    History of diffuse large cell non-Hodgkin's lymphoma-treated in [place]  Chronic Myelomonocytic Leukemia (CMMoL) - Normal cytogenetics  Current Therapy:    Aranesp500 mcg sq q 3 weeks for Hgb < 11 -- changed on 04/30/2019     Interim History:  Jeremiah Walker is back for follow-up.  Is about the same.  Unfortunately, it looks like his anemia is becoming more of a problem.  I will have to increase the dose of Aranesp up to 500 mcg.  We will see if somehow this might be able to help get his anemia up a little bit more.  He has some ecchymoses on his right forearm.  His platelet count is actually little bit better.  He really is on nothing that is going to cause platelet dysfunction.  It might all be part of this underlying myeloproliferative/myelo dysplastic process.  He is trying to exercise.  There is a pool at the assisted living facility.  He does aerobics in the pool.  He does jogging in the pool.  There is no bleeding.  He has had no fever.  He has had no nausea or vomiting.  His weight has been trending downward slowly.  It sounds like he will have a nice Thanksgiving with his family.  Overall, his performance status is ECOG 2.      Medications:  Current Outpatient Medications:  .  allopurinol (ZYLOPRIM) 100 MG tablet, TAKE 2 TABLETS(200 MG) BY MOUTH DAILY, Disp: 180 tablet, Rfl: 1 .  ALPRAZolam (XANAX) 0.5 MG tablet, TAKE 1 TABLET(0.5 MG) BY MOUTH THREE TIMES DAILY AS NEEDED FOR ANXIETY (Patient taking differently: Take 0.5 mg by mouth 3 (three) times daily as needed for anxiety. ), Disp: 90 tablet, Rfl: 3 .  amoxicillin (AMOXIL) 500 MG capsule, Take 4 caps (2gm) by mouth once prior to dental procedure, Disp: 20 capsule, Rfl: 0 .  azelastine (ASTELIN) 0.1 % nasal spray, Place 2 sprays into both nostrils 2 (two) times daily. Use in each nostril as  directed, Disp: 30 mL, Rfl: 11 .  b complex vitamins tablet, Take 1 tablet by mouth daily., Disp: , Rfl:  .  bethanechol (URECHOLINE) 25 MG tablet, TAKE 1 TABLET(25 MG) BY MOUTH THREE TIMES DAILY, Disp: 90 tablet, Rfl: 11 .  Calcium 500 MG tablet, Take 600 mg by mouth daily., Disp: , Rfl:  .  Cholecalciferol (VITAMIN D3) 2000 UNITS capsule, Take 2,000 Units by mouth daily., Disp: , Rfl:  .  folic acid (FOLVITE) 1 MG tablet, TAKE 2 TABLETS(2 MG) BY MOUTH DAILY (Patient taking differently: Take 2 mg by mouth daily. ), Disp: 60 tablet, Rfl: 4 .  HYDROcodone-acetaminophen (NORCO) 7.5-325 MG tablet, Take 1 tablet by mouth every 6 (six) hours as needed for moderate pain., Disp: 100 tablet, Rfl: 0 .  Magnesium Oxide 200 MG TABS, Take 200 mg by mouth daily. , Disp: , Rfl:  .  Melatonin 10 MG TABS, Take 20 mg by mouth. Take 20 mg daily as needed at bedtime., Disp: , Rfl:  .  Multiple Vitamin (MULTIVITAMIN) tablet, Take 1 tablet by mouth daily., Disp: , Rfl:  .  Omega-3 Fatty Acids (OMEGA-3 FISH OIL PO), Take 2 capsules by mouth daily. EPA/DHA 520/350 , Disp: , Rfl:  .  polyethylene glycol (MIRALAX / GLYCOLAX) packet, Take 17 g by mouth daily as needed for mild constipation. , Disp: , Rfl:  .  predniSONE (DELTASONE) 20 MG tablet, May take 1 tablet daily for up to 3 days as needed for gout attack or muscle spasm, Disp: 20 tablet, Rfl: 1 .  pregabalin (LYRICA) 25 MG capsule, Take 1 capsule (25 mg total) by mouth 3 (three) times daily., Disp: 90 capsule, Rfl: 3 .  psyllium (METAMUCIL) 58.6 % powder, Take 1 packet by mouth daily. , Disp: , Rfl:  .  UNABLE TO FIND, Take 1 tablet by mouth daily. Med Name: B-6 VITAMIN , Disp: , Rfl:  .  vitamin C (ASCORBIC ACID) 500 MG tablet, Take 500 mg by mouth daily., Disp: , Rfl:  .  zinc gluconate 50 MG tablet, Take 50 mg by mouth daily., Disp: , Rfl:   Allergies:  Allergies  Allergen Reactions  . Nsaids Other (See Comments) and Tinitus    Bruising   . Tolmetin Other  (See Comments)    Bruising  . Ciprofloxacin Other (See Comments)    Other reaction(s): Other (See Comments) Achilles tendon Achilles tendon Achilles tendon Achilles tendon  . Nitroglycerin   . Statins Nausea And Vomiting and Other (See Comments)    Other reaction(s): Kidney issues Muscle pain Muscle pain Other reaction(s): Kidney issues Muscle pain Muscle pain Other reaction(s): Kidney issues Muscle pain Muscle pain Other reaction(s): Kidney issues Muscle pain  . Requip [Ropinirole Hcl] Anxiety  . Ropinirole Anxiety    Mood swing    Past Medical History, Surgical history, Social history, and Family History were reviewed and updated.  Review of Systems: Review of Systems  Constitutional: Positive for malaise/fatigue.  HENT: Negative.   Eyes: Negative.   Respiratory: Positive for cough.   Cardiovascular: Negative.   Gastrointestinal: Negative.   Genitourinary: Negative.   Musculoskeletal: Negative.   Skin: Negative.   Neurological: Negative.   Endo/Heme/Allergies: Negative.   Psychiatric/Behavioral: Negative.      Physical Exam:  weight is 208 lb 4 oz (94.5 kg). His oral temperature is 98 F (36.7 C). His blood pressure is 104/57 (abnormal) and his pulse is 90. His respiration is 18 and oxygen saturation is 94%.   Wt Readings from Last 3 Encounters:  04/30/19 208 lb 4 oz (94.5 kg)  04/27/19 207 lb 12.8 oz (94.3 kg)  04/08/19 210 lb (95.3 kg)     Physical Exam Vitals signs reviewed.  HENT:     Head: Normocephalic and atraumatic.  Eyes:     Pupils: Pupils are equal, round, and reactive to light.  Neck:     Musculoskeletal: Normal range of motion.  Cardiovascular:     Rate and Rhythm: Normal rate and regular rhythm.     Heart sounds: Murmur present.     Comments: In listening to his cardiac exam, he has a 2/6 systolic ejection murmur. Pulmonary:     Effort: Pulmonary effort is normal.     Breath sounds: Normal breath sounds.  Abdominal:     General:  Bowel sounds are normal.     Palpations: Abdomen is soft.  Musculoskeletal: Normal range of motion.        General: No tenderness or deformity.  Lymphadenopathy:     Cervical: No cervical adenopathy.  Skin:    General: Skin is warm and dry.     Findings: No erythema or rash.  Neurological:     Mental Status: He is alert and oriented to person, place, and time.  Psychiatric:        Behavior: Behavior normal.        Thought Content:  Thought content normal.        Judgment: Judgment normal.     Lab Results  Component Value Date   WBC 6.1 04/30/2019   HGB 9.1 (L) 04/30/2019   HCT 28.5 (L) 04/30/2019   MCV 112.6 (H) 04/30/2019   PLT 104 (L) 04/30/2019     Chemistry      Component Value Date/Time   NA 140 04/30/2019 1101   NA 139 05/08/2017 1123   NA 141 05/15/2016 1126   K 4.2 04/30/2019 1101   K 4.0 05/08/2017 1123   K 4.3 05/15/2016 1126   CL 105 04/30/2019 1101   CL 105 05/08/2017 1123   CO2 27 04/30/2019 1101   CO2 30 05/08/2017 1123   CO2 26 05/15/2016 1126   BUN 28 (H) 04/30/2019 1101   BUN 15 05/08/2017 1123   BUN 29.1 (H) 05/15/2016 1126   CREATININE 1.61 (H) 04/30/2019 1101   CREATININE 1.2 05/08/2017 1123   CREATININE 1.7 (H) 05/15/2016 1126   GLU 90 11/15/2014      Component Value Date/Time   CALCIUM 9.1 04/30/2019 1101   CALCIUM 9.1 05/08/2017 1123   CALCIUM 9.7 05/15/2016 1126   ALKPHOS 54 04/30/2019 1101   ALKPHOS 64 05/08/2017 1123   ALKPHOS 69 05/15/2016 1126   AST 16 04/30/2019 1101   AST 23 05/15/2016 1126   ALT 10 04/30/2019 1101   ALT 19 05/08/2017 1123   ALT 22 05/15/2016 1126   BILITOT 0.9 04/30/2019 1101   BILITOT 0.84 05/15/2016 1126         Impression and Plan: Jeremiah Walker is a 85 year old white male. He had a history of diffuse large cell non-Hodgkin's lymphoma. He was treated  with 6 cycles of R-CHOP. He got this treatment up in [place]. He completed this back in April 2014.   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  This could be a residual from his chemotherapy for his lymphoma.  We will have to see if the increased in Aranesp dose is going to help.  Hopefully, we will not have to end up transfusing him.  I just do not see any treatment options for him given his age and overall performance status.  We will plan to get him back in 3 weeks.  I want to see him back before the Thanksgiving holiday.  I want to make sure that his hemoglobin  is optimized as much as possible.  I spent about 30 minutes with him today.  We had to make changes with his Aranesp dosing.  I did talk to him about his anemia getting worse and the possibilities that we might have to help him.  I answered his questions.  [name], MD 11/5/202012:02 PM

## 2019-04-30 NOTE — Patient Instructions (Signed)
Darbepoetin Alfa injection What is this medicine? DARBEPOETIN ALFA (dar be POE e tin AL fa) helps your body make more red blood cells. It is used to treat anemia caused by chronic kidney failure and chemotherapy. This medicine may be used for other purposes; ask your health care provider or pharmacist if you have questions. COMMON BRAND NAME(S): Aranesp What should I tell my health care provider before I take this medicine? They need to know if you have any of these conditions:  blood clotting disorders or history of blood clots  cancer patient not on chemotherapy  cystic fibrosis  heart disease, such as angina, heart failure, or a history of a heart attack  hemoglobin level of 12 g/dL or greater  high blood pressure  low levels of folate, iron, or vitamin B12  seizures  an unusual or allergic reaction to darbepoetin, erythropoietin, albumin, hamster proteins, latex, other medicines, foods, dyes, or preservatives  pregnant or trying to get pregnant  breast-feeding How should I use this medicine? This medicine is for injection into a vein or under the skin. It is usually given by a health care professional in a hospital or clinic setting. If you get this medicine at home, you will be taught how to prepare and give this medicine. Use exactly as directed. Take your medicine at regular intervals. Do not take your medicine more often than directed. It is important that you put your used needles and syringes in a special sharps container. Do not put them in a trash can. If you do not have a sharps container, call your pharmacist or healthcare provider to get one. A special MedGuide will be given to you by the pharmacist with each prescription and refill. Be sure to read this information carefully each time. Talk to your pediatrician regarding the use of this medicine in children. While this medicine may be used in children as young as 1 month of age for selected conditions, precautions do  apply. Overdosage: If you think you have taken too much of this medicine contact a poison control center or emergency room at once. NOTE: This medicine is only for you. Do not share this medicine with others. What if I miss a dose? If you miss a dose, take it as soon as you can. If it is almost time for your next dose, take only that dose. Do not take double or extra doses. What may interact with this medicine? Do not take this medicine with any of the following medications:  epoetin alfa This list may not describe all possible interactions. Give your health care provider a list of all the medicines, herbs, non-prescription drugs, or dietary supplements you use. Also tell them if you smoke, drink alcohol, or use illegal drugs. Some items may interact with your medicine. What should I watch for while using this medicine? Your condition will be monitored carefully while you are receiving this medicine. You may need blood work done while you are taking this medicine. This medicine may cause a decrease in vitamin B6. You should make sure that you get enough vitamin B6 while you are taking this medicine. Discuss the foods you eat and the vitamins you take with your health care professional. What side effects may I notice from receiving this medicine? Side effects that you should report to your doctor or health care professional as soon as possible:  allergic reactions like skin rash, itching or hives, swelling of the face, lips, or tongue  breathing problems  changes in   vision  chest pain  confusion, trouble speaking or understanding  feeling faint or lightheaded, falls  high blood pressure  muscle aches or pains  pain, swelling, warmth in the leg  rapid weight gain  severe headaches  sudden numbness or weakness of the face, arm or leg  trouble walking, dizziness, loss of balance or coordination  seizures (convulsions)  swelling of the ankles, feet, hands  unusually weak or  tired Side effects that usually do not require medical attention (report to your doctor or health care professional if they continue or are bothersome):  diarrhea  fever, chills (flu-like symptoms)  headaches  nausea, vomiting  redness, stinging, or swelling at site where injected This list may not describe all possible side effects. Call your doctor for medical advice about side effects. You may report side effects to FDA at 1-800-FDA-1088. Where should I keep my medicine? Keep out of the reach of children. Store in a refrigerator between 2 and 8 degrees C (36 and 46 degrees F). Do not freeze. Do not shake. Throw away any unused portion if using a single-dose vial. Throw away any unused medicine after the expiration date. NOTE: This sheet is a summary. It may not cover all possible information. If you have questions about this medicine, talk to your doctor, pharmacist, or health care provider.  2020 Elsevier/Gold Standard (2017-06-26 16:44:20)  

## 2019-05-01 LAB — FERRITIN: Ferritin: 460 ng/mL — ABNORMAL HIGH (ref 24–336)

## 2019-05-01 LAB — IRON AND TIBC
Iron: 114 ug/dL (ref 42–163)
Saturation Ratios: 52 % (ref 20–55)
TIBC: 219 ug/dL (ref 202–409)
UIBC: 104 ug/dL — ABNORMAL LOW (ref 117–376)

## 2019-05-12 ENCOUNTER — Other Ambulatory Visit: Payer: Self-pay

## 2019-05-12 ENCOUNTER — Encounter: Payer: Self-pay | Admitting: Physical Medicine and Rehabilitation

## 2019-05-12 ENCOUNTER — Ambulatory Visit (INDEPENDENT_AMBULATORY_CARE_PROVIDER_SITE_OTHER): Payer: Medicare Other | Admitting: Physical Medicine and Rehabilitation

## 2019-05-12 VITALS — BP 115/58 | HR 83

## 2019-05-12 DIAGNOSIS — M545 Low back pain: Secondary | ICD-10-CM | POA: Diagnosis not present

## 2019-05-12 DIAGNOSIS — G8929 Other chronic pain: Secondary | ICD-10-CM | POA: Diagnosis not present

## 2019-05-12 DIAGNOSIS — M4316 Spondylolisthesis, lumbar region: Secondary | ICD-10-CM

## 2019-05-12 DIAGNOSIS — M7918 Myalgia, other site: Secondary | ICD-10-CM

## 2019-05-12 DIAGNOSIS — Z96642 Presence of left artificial hip joint: Secondary | ICD-10-CM

## 2019-05-12 DIAGNOSIS — M48062 Spinal stenosis, lumbar region with neurogenic claudication: Secondary | ICD-10-CM

## 2019-05-12 MED ORDER — METHYLPREDNISOLONE ACETATE 40 MG/ML IJ SUSP
40.0000 mg | INTRAMUSCULAR | Status: AC | PRN
  Administered 2019-05-12: 13:00:00 40 mg via INTRAMUSCULAR

## 2019-05-12 MED ORDER — LIDOCAINE HCL 1 % IJ SOLN
3.0000 mL | INTRAMUSCULAR | Status: AC | PRN
  Administered 2019-05-12: 13:00:00 3 mL

## 2019-05-12 NOTE — Progress Notes (Signed)
  Numeric Pain Rating Scale and Functional Assessment Average Pain 4   In the last MONTH (on 0-10 scale) has pain interfered with the following?  1. General activity like being  able to carry out your everyday physical activities such as walking, climbing stairs, carrying groceries, or moving a chair?  Rating(5)

## 2019-05-12 NOTE — Progress Notes (Signed)
JAEDIN Walker - 85 y.o. male MRN [id number]  Date of birth: 1927/12/16  Office Visit Note: Visit Date: 05/12/2019 PCP: [name], MD Referred by: [name], MD  Subjective: Chief Complaint  Patient presents with   Lower Back - Pain   HPI: Jeremiah Walker is a 85 y.o. male who comes in today For reevaluation management of left-sided low back pain some referral into the thighs bilaterally with more claudication type symptoms with walking.  Patient has done well in the past with combination of facet joint block as well as trigger point injections of the left sided musculature consisting of the quadratus lumborum time of the latissimus dorsi and paraspinal region.  Last injection was many months ago and has done quite well up until just recently.  He reports like a lot of older patients he has been unable to attend workouts in the gym facility.  He can do this now by appointment but is hard to do.  He has gotten into the pool some and is doing some pool exercises.  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  He is 85 years old and tries to stay active.  He does have some ability to exercise in his home.  He reports no new trauma or falls.  No focal focal weakness.  Pain is on the left side of the lower back really at the area just medial to the iliac crest.  He has no pain down the legs.  Again no focal weakness no bowel bladder changes or red flag complaints.  He has MRI from 47 reviewed with him today.  Showed him the area higher up in the spine around L3-4 and L2-3 showing some moderate narrowing.  Review of Systems  Constitutional: Positive for malaise/fatigue. Negative for chills, fever and weight loss.  HENT: Negative for hearing loss and sinus pain.   Eyes: Negative for blurred vision, double vision and photophobia.  Respiratory: Negative for cough and shortness of breath.   Cardiovascular: Negative for chest pain, palpitations and leg  swelling.  Gastrointestinal: Negative for abdominal pain, nausea and vomiting.  Genitourinary: Negative for flank pain.  Musculoskeletal: Positive for back pain and joint pain. Negative for myalgias.  Skin: Negative for itching and rash.  Neurological: Positive for weakness. Negative for tremors and focal weakness.  Endo/Heme/Allergies: Negative.   Psychiatric/Behavioral: Negative for depression.  All other systems reviewed and are negative.  Otherwise per HPI.  Assessment & Plan: Visit Diagnoses:  1. Chronic left-sided low back pain without sciatica   2. Myofascial pain syndrome   3. H/O total hip arthroplasty, left   4. Spondylolisthesis of lumbar region   5. Spinal stenosis of lumbar region with neurogenic claudication     Plan: Findings:  Chronic worsening severe left lower back pain mostly axial somewhat worse with standing and walking can be done with twisting as well.  He does have focal trigger points in this area as well and has history of lumbar spondylosis at L4-5 with small listhesis.  No high-grade stenosis at that level but he does have some stenosis on the right.  We have not completed epidural injection because of his not having radicular pain but today is having more claudication type symptoms in the upper thighs.  He tries to relate some of his inability to exercise do to chronic leukemia that he has had and his blood count has dropped some and he is tired at  times.  He also has not been able to work out like he used to.  He is doing water exercises.  We suggested doing squat type exercises which were demonstrated to him today and he looks forward to trying those few days a week.  I did complete trigger point injection again today to see if that would help him in the left lower back.  Depending on results we entertain the idea of upper lumbar epidural injection versus facet joint block.  Could look at dry needling again with physical therapy.  Differential diagnosis could be  cluneal nerve entrapment.    Meds & Orders: No orders of the defined types were placed in this encounter.   Orders Placed This Encounter  Procedures   Trigger Point Inj    Follow-up: Return if symptoms worsen or fail to improve.   Procedures: Trigger Point Inj  Date/Time: 05/12/2019 12:49 PM Performed by: Magnus Sinning, MD Authorized by: Magnus Sinning, MD   Consent Given by:  Patient Site marked: the procedure site was marked   Timeout: prior to procedure the correct patient, procedure, and site was verified   Indications:  Pain Total # of Trigger Points:  3 or more Location: back   Needle Size:  25 G Approach:  Dorsal and lateral Medications #1:  3 mL lidocaine 1 %; 40 mg methylPREDNISolone acetate 40 MG/ML Patient tolerance:  Patient tolerated the procedure well with no immediate complications Comments: Trigger points palpated in the left paraspinal region and quadratus lumborum and latissimus dorsi.    No notes on file   Clinical History: Lumbar spine MRI dated 05/29/2016   L4-5: Anterolisthesis, uncovered disc with small bulge, central protrusion, moderate facet and ligamentum flavum hypertrophy, and large right foraminal and extra foraminal marginal osteophyte. Severe right foraminal narrowing. Mild left foraminal narrowing. Effacement of lateral recesses with contact upon the descending right L5 nerve root. No significant canal stenosis.   L5-S1: Small disc bulge and mild facet/ligamentum flavum hypertrophy. Mild bilateral foraminal narrowing. Right-sided 5 mm T2 hyperintense structure arising from the right facet joint possibly impinging the exiting right L5 nerve root likely a synovial cyst  Mild S-shaped lumbar curvature, grade 1 L4-5 anterolisthesis, and prominent discogenic and facet arthropathy. No acute osseous abnormality.  No high-grade canal stenosis. 3. Multiple levels of foraminal narrowing moderate at left L2-3, moderate at right L3-4,  and severe at right L4-5 levels. 4. Large marginal osteophytes with contact upon exiting left L2 nerve root at the L2-3 level and right L3 nerve root at the L3-4 level.    He reports that he has quit smoking. He has never used smokeless tobacco.  Recent Labs    12/29/18 1605  LABURIC 6.1    Objective:  VS:  HT:     WT:    BMI:      BP:(!) 115/58   HR:83bpm   TEMP: ( )   RESP:  Physical Exam Vitals signs and nursing note reviewed.  Constitutional:      General: He is not in acute distress.    Appearance: Normal appearance. He is well-developed. He is obese.  HENT:     Head: Normocephalic and atraumatic.  Eyes:     Conjunctiva/sclera: Conjunctivae normal.     Pupils: Pupils are equal, round, and reactive to light.  Neck:     Musculoskeletal: Normal range of motion and neck supple. No neck rigidity.  Cardiovascular:     Rate and Rhythm: Normal rate.     Pulses:  Normal pulses.     Heart sounds: Normal heart sounds.  Pulmonary:     Effort: Pulmonary effort is normal. No respiratory distress.  Musculoskeletal:     Right lower leg: No edema.     Left lower leg: No edema.     Comments: Patient has a hard time going from sit to full extension and standing.  He has pain with extension of the lumbar spine.  He has focal trigger points in the quadratus lumborum and multifidus I as well as latissimus dorsi.  He has good strength in lower extremities without clonus bilaterally.  No pain with hip rotation.  He ambulates with forward flexed lumbar spine  Skin:    General: Skin is warm and dry.     Findings: No erythema or rash.  Neurological:     General: No focal deficit present.     Mental Status: He is alert and oriented to person, place, and time.     Sensory: No sensory deficit.     Coordination: Coordination normal.     Gait: Gait normal.  Psychiatric:        Mood and Affect: Mood normal.        Behavior: Behavior normal.     Ortho Exam Imaging: No results found.  Past  Medical/Family/Surgical/Social History: Medications & Allergies reviewed per EMR, new medications updated. 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 ([place]) 12/04/2016   Chronic pain syndrome 09/26/2015   De Quervain's tenosynovitis, right 09/13/2015   Anemia due to antineoplastic chemotherapy 08/15/2015   DLBCL (diffuse large B cell lymphoma) ([place]) 08/15/2015   Skin lesion of face 05/21/2015   Peripheral neuropathy due to chemotherapy ([place]) 02/09/2015   Benign prostatic hyperplasia with urinary obstruction 02/09/2015   Bilateral hearing loss 02/09/2015   Low back pain 02/09/2015   Chronic kidney disease (CKD), stage III (moderate) 02/09/2015   Essential (primary) hypertension 02/09/2015   Gastro-esophageal reflux disease without esophagitis 02/09/2015   Anxiety, generalized 02/09/2015   Cannot sleep 02/09/2015   Combined fat and carbohydrate induced hyperlipemia 02/09/2015   Past Medical History:  Diagnosis Date   Anemia    Anemia due to antineoplastic chemotherapy 08/15/2015   Anxiety    Arthritis    Cataract    Chronic kidney disease (CKD), stage III (moderate) 02/09/2015   Controlled   CMML (chronic myelomonocytic leukemia) ([place])    gets Aranesp about monthly, otherwise surveillance with Dr. Marin Olp   Combined fat and carbohydrate induced hyperlipemia 02/09/2015   Controlled   Depression    DLBCL (diffuse large B cell lymphoma) ([place]) 08/15/2015   Essential (primary) hypertension 02/09/2015   Gastro-esophageal reflux disease without esophagitis 02/09/2015   Controlled   Tinnitus    Family History  Problem Relation Age of Onset   Anuerysm Mother 56       Deceased   Colon cancer Father 7       Deceased   Colon cancer Brother    Prostate cancer Brother        Deceased   Alzheimer's disease Brother    Heart disease Brother    Alzheimer's  disease Paternal Grandmother    Early death Maternal Grandmother        Unknown   Obesity Son    Obesity Son        Gastric Bypass   Healthy Son    Healthy Daughter    Diabetes Neg  Hx    Past Surgical History:  Procedure Laterality Date   CATARACT EXTRACTION, BILATERAL     COLONOSCOPY  May 2011   CYSTOSCOPY     TOTAL HIP ARTHROPLASTY  2003   Right    TOTAL HIP ARTHROPLASTY  April, 2011   Left   TRIGGER FINGER RELEASE  Nov 2013   TURBINECTOMY  2006   UPPER GI ENDOSCOPY  Nov 2015   Social History   Occupational History   Not on file  Tobacco Use   Smoking status: Former Smoker   Smokeless tobacco: Never Used   Tobacco comment: Quit >50 years ago  Substance and Sexual Activity   Alcohol use: Yes    Alcohol/week: 1.0 - 4.0 standard drinks    Types: 1 - 4 Shots of liquor per week    Comment: 4 glasses per week   Drug use: No   Sexual activity: Not on file

## 2019-05-18 ENCOUNTER — Encounter: Payer: Self-pay | Admitting: Family Medicine

## 2019-05-19 ENCOUNTER — Other Ambulatory Visit: Payer: Self-pay

## 2019-05-19 ENCOUNTER — Inpatient Hospital Stay (HOSPITAL_BASED_OUTPATIENT_CLINIC_OR_DEPARTMENT_OTHER): Payer: Medicare Other | Admitting: Hematology & Oncology

## 2019-05-19 ENCOUNTER — Encounter: Payer: Self-pay | Admitting: Hematology & Oncology

## 2019-05-19 ENCOUNTER — Inpatient Hospital Stay: Payer: Medicare Other

## 2019-05-19 VITALS — BP 123/73 | HR 70 | Temp 97.3°F | Resp 16 | Wt 202.0 lb

## 2019-05-19 DIAGNOSIS — R05 Cough: Secondary | ICD-10-CM | POA: Diagnosis not present

## 2019-05-19 DIAGNOSIS — C9310 Chronic myelomonocytic leukemia not having achieved remission: Secondary | ICD-10-CM | POA: Diagnosis not present

## 2019-05-19 DIAGNOSIS — R5383 Other fatigue: Secondary | ICD-10-CM | POA: Diagnosis not present

## 2019-05-19 DIAGNOSIS — D6481 Anemia due to antineoplastic chemotherapy: Secondary | ICD-10-CM

## 2019-05-19 DIAGNOSIS — R58 Hemorrhage, not elsewhere classified: Secondary | ICD-10-CM | POA: Diagnosis not present

## 2019-05-19 DIAGNOSIS — T451X5A Adverse effect of antineoplastic and immunosuppressive drugs, initial encounter: Secondary | ICD-10-CM

## 2019-05-19 DIAGNOSIS — D691 Qualitative platelet defects: Secondary | ICD-10-CM | POA: Diagnosis not present

## 2019-05-19 LAB — CMP (CANCER CENTER ONLY)
ALT: 10 U/L (ref 0–44)
AST: 14 U/L — ABNORMAL LOW (ref 15–41)
Albumin: 4.3 g/dL (ref 3.5–5.0)
Alkaline Phosphatase: 56 U/L (ref 38–126)
Anion gap: 7 (ref 5–15)
BUN: 28 mg/dL — ABNORMAL HIGH (ref 8–23)
CO2: 28 mmol/L (ref 22–32)
Calcium: 9.5 mg/dL (ref 8.9–10.3)
Chloride: 103 mmol/L (ref 98–111)
Creatinine: 1.61 mg/dL — ABNORMAL HIGH (ref 0.61–1.24)
GFR, Est AFR Am: 43 mL/min — ABNORMAL LOW (ref >60)
GFR, Estimated: 37 mL/min — ABNORMAL LOW (ref >60)
Glucose, Bld: 89 mg/dL (ref 70–99)
Potassium: 5.1 mmol/L (ref 3.5–5.1)
Sodium: 138 mmol/L (ref 135–145)
Total Bilirubin: 0.9 mg/dL (ref 0.3–1.2)
Total Protein: 7.8 g/dL (ref 6.5–8.1)

## 2019-05-19 LAB — CBC WITH DIFFERENTIAL (CANCER CENTER ONLY)
Abs Immature Granulocytes: 0.14 10*3/uL — ABNORMAL HIGH (ref 0.00–0.07)
Basophils Absolute: 0 10*3/uL (ref 0.0–0.1)
Basophils Relative: 0 %
Eosinophils Absolute: 0.1 10*3/uL (ref 0.0–0.5)
Eosinophils Relative: 2 %
HCT: 31 % — ABNORMAL LOW (ref 39.0–52.0)
Hemoglobin: 10 g/dL — ABNORMAL LOW (ref 13.0–17.0)
Immature Granulocytes: 2 %
Lymphocytes Relative: 28 %
Lymphs Abs: 2.2 10*3/uL (ref 0.7–4.0)
MCH: 36.9 pg — ABNORMAL HIGH (ref 26.0–34.0)
MCHC: 32.3 g/dL (ref 30.0–36.0)
MCV: 114.4 fL — ABNORMAL HIGH (ref 80.0–100.0)
Monocytes Absolute: 3.2 10*3/uL — ABNORMAL HIGH (ref 0.1–1.0)
Monocytes Relative: 41 %
Neutro Abs: 2.1 10*3/uL (ref 1.7–7.7)
Neutrophils Relative %: 27 %
Platelet Count: 112 10*3/uL — ABNORMAL LOW (ref 150–400)
RBC: 2.71 MIL/uL — ABNORMAL LOW (ref 4.22–5.81)
RDW: 17.3 % — ABNORMAL HIGH (ref 11.5–15.5)
WBC Count: 7.8 10*3/uL (ref 4.0–10.5)
nRBC: 0 % (ref 0.0–0.2)

## 2019-05-19 LAB — RETICULOCYTES
Immature Retic Fract: 24.6 % — ABNORMAL HIGH (ref 2.3–15.9)
RBC.: 2.67 MIL/uL — ABNORMAL LOW (ref 4.22–5.81)
Retic Count, Absolute: 96.1 10*3/uL (ref 19.0–186.0)
Retic Ct Pct: 3.6 % — ABNORMAL HIGH (ref 0.4–3.1)

## 2019-05-19 MED ORDER — DARBEPOETIN ALFA 500 MCG/ML IJ SOSY
PREFILLED_SYRINGE | INTRAMUSCULAR | Status: AC
  Filled 2019-05-19: qty 1

## 2019-05-19 MED ORDER — ALLOPURINOL 100 MG PO TABS: ORAL_TABLET | ORAL | 3 refills | Status: AC

## 2019-05-19 MED ORDER — DARBEPOETIN ALFA 500 MCG/ML IJ SOSY
500.0000 ug | PREFILLED_SYRINGE | Freq: Once | INTRAMUSCULAR | Status: AC
  Administered 2019-05-19: 500 ug via SUBCUTANEOUS

## 2019-05-19 NOTE — Patient Instructions (Signed)
Darbepoetin Alfa injection What is this medicine? DARBEPOETIN ALFA (dar be POE e tin AL fa) helps your body make more red blood cells. It is used to treat anemia caused by chronic kidney failure and chemotherapy. This medicine may be used for other purposes; ask your health care provider or pharmacist if you have questions. COMMON BRAND NAME(S): Aranesp What should I tell my health care provider before I take this medicine? They need to know if you have any of these conditions:  blood clotting disorders or history of blood clots  cancer patient not on chemotherapy  cystic fibrosis  heart disease, such as angina, heart failure, or a history of a heart attack  hemoglobin level of 12 g/dL or greater  high blood pressure  low levels of folate, iron, or vitamin B12  seizures  an unusual or allergic reaction to darbepoetin, erythropoietin, albumin, hamster proteins, latex, other medicines, foods, dyes, or preservatives  pregnant or trying to get pregnant  breast-feeding How should I use this medicine? This medicine is for injection into a vein or under the skin. It is usually given by a health care professional in a hospital or clinic setting. If you get this medicine at home, you will be taught how to prepare and give this medicine. Use exactly as directed. Take your medicine at regular intervals. Do not take your medicine more often than directed. It is important that you put your used needles and syringes in a special sharps container. Do not put them in a trash can. If you do not have a sharps container, call your pharmacist or healthcare provider to get one. A special MedGuide will be given to you by the pharmacist with each prescription and refill. Be sure to read this information carefully each time. Talk to your pediatrician regarding the use of this medicine in children. While this medicine may be used in children as young as 1 month of age for selected conditions, precautions do  apply. Overdosage: If you think you have taken too much of this medicine contact a poison control center or emergency room at once. NOTE: This medicine is only for you. Do not share this medicine with others. What if I miss a dose? If you miss a dose, take it as soon as you can. If it is almost time for your next dose, take only that dose. Do not take double or extra doses. What may interact with this medicine? Do not take this medicine with any of the following medications:  epoetin alfa This list may not describe all possible interactions. Give your health care provider a list of all the medicines, herbs, non-prescription drugs, or dietary supplements you use. Also tell them if you smoke, drink alcohol, or use illegal drugs. Some items may interact with your medicine. What should I watch for while using this medicine? Your condition will be monitored carefully while you are receiving this medicine. You may need blood work done while you are taking this medicine. This medicine may cause a decrease in vitamin B6. You should make sure that you get enough vitamin B6 while you are taking this medicine. Discuss the foods you eat and the vitamins you take with your health care professional. What side effects may I notice from receiving this medicine? Side effects that you should report to your doctor or health care professional as soon as possible:  allergic reactions like skin rash, itching or hives, swelling of the face, lips, or tongue  breathing problems  changes in   vision  chest pain  confusion, trouble speaking or understanding  feeling faint or lightheaded, falls  high blood pressure  muscle aches or pains  pain, swelling, warmth in the leg  rapid weight gain  severe headaches  sudden numbness or weakness of the face, arm or leg  trouble walking, dizziness, loss of balance or coordination  seizures (convulsions)  swelling of the ankles, feet, hands  unusually weak or  tired Side effects that usually do not require medical attention (report to your doctor or health care professional if they continue or are bothersome):  diarrhea  fever, chills (flu-like symptoms)  headaches  nausea, vomiting  redness, stinging, or swelling at site where injected This list may not describe all possible side effects. Call your doctor for medical advice about side effects. You may report side effects to FDA at 1-800-FDA-1088. Where should I keep my medicine? Keep out of the reach of children. Store in a refrigerator between 2 and 8 degrees C (36 and 46 degrees F). Do not freeze. Do not shake. Throw away any unused portion if using a single-dose vial. Throw away any unused medicine after the expiration date. NOTE: This sheet is a summary. It may not cover all possible information. If you have questions about this medicine, talk to your doctor, pharmacist, or health care provider.  2020 Elsevier/Gold Standard (2017-06-26 16:44:20)  

## 2019-05-19 NOTE — Progress Notes (Signed)
Hematology and Oncology Follow Up Visit  Jeremiah Walker [id number] Apr 24, 1928 85 y.o. 05/19/2019   Principle Diagnosis:    History of diffuse large cell non-Hodgkin's lymphoma-treated in [place]  Chronic Myelomonocytic Leukemia (CMMoL) - Normal cytogenetics  Current Therapy:    Aranesp 500 mcg sq q 3 weeks for Hgb < 11 -- changed on 04/30/2019     Interim History:  Jeremiah Walker is back for follow-up.  Is about the same.  I think that the increase in the Aranesp dose is helping.  His hemoglobin is finally above 10.  He does feel a little bit better.  He did have a little bit of a tough day yesterday.  I am not sure that this would be related to his anemia.  I think is more related to his age.  He will be at a son's house for Thanksgiving.  He is looking forward to this.  He has had no issues with bleeding.  He has had no problems with bowels or bladder.  He has had no cough.   He tries to exercise every day.   Overall, his performance status is ECOG 2.      Medications:  Current Outpatient Medications:  .  allopurinol (ZYLOPRIM) 100 MG tablet, TAKE 2 TABLETS(200 MG) BY MOUTH DAILY, Disp: 180 tablet, Rfl: 3 .  ALPRAZolam (XANAX) 0.5 MG tablet, TAKE 1 TABLET(0.5 MG) BY MOUTH THREE TIMES DAILY AS NEEDED FOR ANXIETY (Patient taking differently: Take 0.5 mg by mouth 3 (three) times daily as needed for anxiety. ), Disp: 90 tablet, Rfl: 3 .  amoxicillin (AMOXIL) 500 MG capsule, Take 4 caps (2gm) by mouth once prior to dental procedure, Disp: 20 capsule, Rfl: 0 .  azelastine (ASTELIN) 0.1 % nasal spray, Place 2 sprays into both nostrils 2 (two) times daily. Use in each nostril as directed, Disp: 30 mL, Rfl: 11 .  b complex vitamins tablet, Take 1 tablet by mouth daily., Disp: , Rfl:  .  bethanechol (URECHOLINE) 25 MG tablet, TAKE 1 TABLET(25 MG) BY MOUTH THREE TIMES DAILY, Disp: 90 tablet, Rfl: 11 .  Calcium 500 MG tablet, Take 600 mg by mouth daily., Disp: , Rfl:  .  Cholecalciferol  (VITAMIN D3) 2000 UNITS capsule, Take 2,000 Units by mouth daily., Disp: , Rfl:  .  folic acid (FOLVITE) 1 MG tablet, TAKE 2 TABLETS(2 MG) BY MOUTH DAILY (Patient taking differently: Take 2 mg by mouth daily. ), Disp: 60 tablet, Rfl: 4 .  HYDROcodone-acetaminophen (NORCO) 7.5-325 MG tablet, Take 1 tablet by mouth every 6 (six) hours as needed for moderate pain., Disp: 100 tablet, Rfl: 0 .  Magnesium Oxide 200 MG TABS, Take 200 mg by mouth daily. , Disp: , Rfl:  .  Melatonin 10 MG TABS, Take 20 mg by mouth. Take 20 mg daily as needed at bedtime., Disp: , Rfl:  .  Multiple Vitamin (MULTIVITAMIN) tablet, Take 1 tablet by mouth daily., Disp: , Rfl:  .  Omega-3 Fatty Acids (OMEGA-3 FISH OIL PO), Take 2 capsules by mouth daily. EPA/DHA 520/350 , Disp: , Rfl:  .  polyethylene glycol (MIRALAX / GLYCOLAX) packet, Take 17 g by mouth daily as needed for mild constipation. , Disp: , Rfl:  .  predniSONE (DELTASONE) 20 MG tablet, May take 1 tablet daily for up to 3 days as needed for gout attack or muscle spasm, Disp: 20 tablet, Rfl: 1 .  pregabalin (LYRICA) 25 MG capsule, Take 1 capsule (25 mg total) by mouth 3 (three) times  daily., Disp: 90 capsule, Rfl: 3 .  psyllium (METAMUCIL) 58.6 % powder, Take 1 packet by mouth daily. , Disp: , Rfl:  .  UNABLE TO FIND, Take 1 tablet by mouth daily. Med Name: B-6 VITAMIN , Disp: , Rfl:  .  vitamin C (ASCORBIC ACID) 500 MG tablet, Take 500 mg by mouth daily., Disp: , Rfl:  .  zinc gluconate 50 MG tablet, Take 50 mg by mouth daily., Disp: , Rfl:   Allergies:  Allergies  Allergen Reactions  . Nsaids Other (See Comments) and Tinitus    Bruising   . Tolmetin Other (See Comments)    Bruising  . Ciprofloxacin Other (See Comments)    Other reaction(s): Other (See Comments) Achilles tendon Achilles tendon Achilles tendon Achilles tendon  . Nitroglycerin   . Statins Nausea And Vomiting and Other (See Comments)    Other reaction(s): Kidney issues Muscle pain Muscle  pain Other reaction(s): Kidney issues Muscle pain Muscle pain Other reaction(s): Kidney issues Muscle pain Muscle pain Other reaction(s): Kidney issues Muscle pain  . Requip [Ropinirole Hcl] Anxiety  . Ropinirole Anxiety    Mood swing    Past Medical History, Surgical history, Social history, and Family History were reviewed and updated.  Review of Systems: Review of Systems  Constitutional: Positive for malaise/fatigue.  HENT: Negative.   Eyes: Negative.   Respiratory: Positive for cough.   Cardiovascular: Negative.   Gastrointestinal: Negative.   Genitourinary: Negative.   Musculoskeletal: Negative.   Skin: Negative.   Neurological: Negative.   Endo/Heme/Allergies: Negative.   Psychiatric/Behavioral: Negative.      Physical Exam:  vitals were not taken for this visit.   Wt Readings from Last 3 Encounters:  04/30/19 208 lb 4 oz (94.5 kg)  04/27/19 207 lb 12.8 oz (94.3 kg)  04/08/19 210 lb (95.3 kg)     Physical Exam Vitals signs reviewed.  HENT:     Head: Normocephalic and atraumatic.  Eyes:     Pupils: Pupils are equal, round, and reactive to light.  Neck:     Musculoskeletal: Normal range of motion.  Cardiovascular:     Rate and Rhythm: Normal rate and regular rhythm.     Heart sounds: Murmur present.     Comments: In listening to his cardiac exam, he has a 2/6 systolic ejection murmur. Pulmonary:     Effort: Pulmonary effort is normal.     Breath sounds: Normal breath sounds.  Abdominal:     General: Bowel sounds are normal.     Palpations: Abdomen is soft.  Musculoskeletal: Normal range of motion.        General: No tenderness or deformity.  Lymphadenopathy:     Cervical: No cervical adenopathy.  Skin:    General: Skin is warm and dry.     Findings: No erythema or rash.  Neurological:     Mental Status: He is alert and oriented to person, place, and time.  Psychiatric:        Behavior: Behavior normal.        Thought Content: Thought  content normal.        Judgment: Judgment normal.     Lab Results  Component Value Date   WBC 7.8 05/19/2019   HGB 10.0 (L) 05/19/2019   HCT 31.0 (L) 05/19/2019   MCV 114.4 (H) 05/19/2019   PLT 112 (L) 05/19/2019     Chemistry      Component Value Date/Time   NA 138 05/19/2019 1157   NA 139  05/08/2017 1123   NA 141 05/15/2016 1126   K 5.1 05/19/2019 1157   K 4.0 05/08/2017 1123   K 4.3 05/15/2016 1126   CL 103 05/19/2019 1157   CL 105 05/08/2017 1123   CO2 28 05/19/2019 1157   CO2 30 05/08/2017 1123   CO2 26 05/15/2016 1126   BUN 28 (H) 05/19/2019 1157   BUN 15 05/08/2017 1123   BUN 29.1 (H) 05/15/2016 1126   CREATININE 1.61 (H) 05/19/2019 1157   CREATININE 1.2 05/08/2017 1123   CREATININE 1.7 (H) 05/15/2016 1126   GLU 90 11/15/2014      Component Value Date/Time   CALCIUM 9.5 05/19/2019 1157   CALCIUM 9.1 05/08/2017 1123   CALCIUM 9.7 05/15/2016 1126   ALKPHOS 56 05/19/2019 1157   ALKPHOS 64 05/08/2017 1123   ALKPHOS 69 05/15/2016 1126   AST 14 (L) 05/19/2019 1157   AST 23 05/15/2016 1126   ALT 10 05/19/2019 1157   ALT 19 05/08/2017 1123   ALT 22 05/15/2016 1126   BILITOT 0.9 05/19/2019 1157   BILITOT 0.84 05/15/2016 1126         Impression and Plan: Mr. Fullen is a 85 year old white male. He had a history of diffuse large cell non-Hodgkin's lymphoma. He was treated  with 6 cycles of R-CHOP. He got this treatment up in [place]. He completed this back in April 2014.   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 This could be a residual from his chemotherapy for his lymphoma.  Hopefully, the increase in Aranesp dose is going to be helpful.  Hopefully this will improve his quality of life.  I know that he likes to stay active.  His level of activity is directly related to his hemoglobin.  We will plan to get him back to see [place] in another 3 weeks.   [name], MD 11/24/202012:35 PM

## 2019-05-20 LAB — IRON AND TIBC
Iron: 93 ug/dL (ref 42–163)
Saturation Ratios: 37 % (ref 20–55)
TIBC: 253 ug/dL (ref 202–409)
UIBC: 160 ug/dL (ref 117–376)

## 2019-05-20 LAB — FERRITIN: Ferritin: 342 ng/mL — ABNORMAL HIGH (ref 24–336)

## 2019-05-25 ENCOUNTER — Encounter: Payer: Self-pay | Admitting: Family Medicine

## 2019-05-25 DIAGNOSIS — C9310 Chronic myelomonocytic leukemia not having achieved remission: Secondary | ICD-10-CM | POA: Diagnosis not present

## 2019-05-25 DIAGNOSIS — I129 Hypertensive chronic kidney disease with stage 1 through stage 4 chronic kidney disease, or unspecified chronic kidney disease: Secondary | ICD-10-CM | POA: Diagnosis not present

## 2019-05-25 DIAGNOSIS — N2581 Secondary hyperparathyroidism of renal origin: Secondary | ICD-10-CM | POA: Diagnosis not present

## 2019-05-25 DIAGNOSIS — C8330 Diffuse large B-cell lymphoma, unspecified site: Secondary | ICD-10-CM | POA: Diagnosis not present

## 2019-05-25 DIAGNOSIS — E785 Hyperlipidemia, unspecified: Secondary | ICD-10-CM | POA: Diagnosis not present

## 2019-05-25 DIAGNOSIS — D631 Anemia in chronic kidney disease: Secondary | ICD-10-CM | POA: Diagnosis not present

## 2019-05-25 DIAGNOSIS — N183 Chronic kidney disease, stage 3 unspecified: Secondary | ICD-10-CM | POA: Diagnosis not present

## 2019-05-25 DIAGNOSIS — D696 Thrombocytopenia, unspecified: Secondary | ICD-10-CM | POA: Diagnosis not present

## 2019-05-25 DIAGNOSIS — G894 Chronic pain syndrome: Secondary | ICD-10-CM

## 2019-05-26 MED ORDER — HYDROCODONE-ACETAMINOPHEN 7.5-325 MG PO TABS: 1.0000 | ORAL_TABLET | Freq: Four times a day (QID) | ORAL | 0 refills | Status: DC | PRN

## 2019-05-27 ENCOUNTER — Ambulatory Visit: Payer: Medicare Other | Admitting: Hematology & Oncology

## 2019-05-27 ENCOUNTER — Ambulatory Visit: Payer: Medicare Other

## 2019-05-27 ENCOUNTER — Other Ambulatory Visit: Payer: Medicare Other

## 2019-05-28 ENCOUNTER — Other Ambulatory Visit: Payer: Self-pay

## 2019-05-28 ENCOUNTER — Ambulatory Visit (INDEPENDENT_AMBULATORY_CARE_PROVIDER_SITE_OTHER): Payer: Medicare Other | Admitting: Orthopaedic Surgery

## 2019-05-28 ENCOUNTER — Encounter: Payer: Self-pay | Admitting: Orthopaedic Surgery

## 2019-05-28 VITALS — Ht 69.0 in | Wt 202.0 lb

## 2019-05-28 DIAGNOSIS — G8929 Other chronic pain: Secondary | ICD-10-CM | POA: Diagnosis not present

## 2019-05-28 DIAGNOSIS — M545 Low back pain, unspecified: Secondary | ICD-10-CM

## 2019-05-28 NOTE — Progress Notes (Signed)
Office Visit Note   Patient: Jeremiah Walker           Date of Birth: 21-Oct-1927           MRN: [id number] Visit Date: 05/28/2019              Requested by: [name], MD [address] [place],  [place] [id number] PCP: [name], MD   Assessment & Plan: Visit Diagnoses:  1. Chronic left-sided low back pain without sciatica     Plan: Mr. Klahn has a chronic history of back problems with a diagnosis of spinal stenosis per MRI scan.  He has been followed by Dr. [name] for injections.  He recently had some areas of trigger point tenderness which were not of much help but does have a follow-up appointment for possible epidural steroid injection.  His concern today was his leg length discrepancy and using lifts in his shoe.  He has had prior bilateral hip replacements in the past and feels like his left leg is becoming "shorter".  As a evaluate his hips he is about half to 5/8 of an inch shorter on the left than the right he did have some lifts that we put an issue of 5/8 of an inch which leveled his pelvis.  I would suggest he continue with those and follow-up with Dr. [name]  Follow-Up Instructions: Return if symptoms worsen or fail to improve.   Orders:  No orders of the defined types were placed in this encounter.  No orders of the defined types were placed in this encounter.     Procedures: No procedures performed   Clinical Data: No additional findings.   Subjective: Chief Complaint  Patient presents with  . Lower Back - Follow-up  Patient presents today for follow up on his lower back. He saw Dr.Newton and had a trigger point injection on 05/12/2019. He said that the injection may have helped some, but not much. He wears a .25inch lift in his left shoe and wonders if that is contributing to his back pain. He has had prior bilateral hip replacements with only about a quarter of an inch discrepancy shorter on the left but feels like he needs  more for leveling his hips and is not sure if the leg length discrepancy is contributing to his back pain HPI  Review of Systems  Constitutional: Positive for fatigue.  HENT: Negative for ear pain.   Eyes: Negative for pain.  Respiratory: Negative for shortness of breath.   Cardiovascular: Negative for leg swelling.  Gastrointestinal: Negative for constipation and diarrhea.  Endocrine: Negative for cold intolerance and heat intolerance.  Genitourinary: Negative for difficulty urinating.  Musculoskeletal: Negative for joint swelling.  Skin: Negative for rash.  Allergic/Immunologic: Negative for food allergies.  Neurological: Negative for weakness.  Hematological: Does not bruise/bleed easily.  Psychiatric/Behavioral: Positive for sleep disturbance.     Objective: Vital Signs: Ht 5\' 9"  (1.753 m)   Wt 202 lb (91.6 kg)   BMI 29.83 kg/m   Physical Exam Constitutional:      Appearance: He is well-developed.  Eyes:     Pupils: Pupils are equal, round, and reactive to light.  Pulmonary:     Effort: Pulmonary effort is normal.  Skin:    General: Skin is warm and dry.  Neurological:     Mental Status: He is alert and oriented to person, place, and time.  Psychiatric:  Behavior: Behavior normal.     Ortho Exam awake alert and oriented x3.  Comfortable sitting.  I did measure his leg lengths and is about 5/8 of an inch shorter on the left.  He did have some lifts in his left shoe that measured about 5/8 of an inch when I put those in and remeasured his hips they felt like they were level.  He does use a cane.  He needs to be careful about putting too much of a lift in the shoe as he may walk out of the shoe and might want to consider putting something in the heel it over time but for the moment he is stable  Specialty Comments:  No specialty comments available.  Imaging: No results found.   PMFS History: Patient Active Problem List   Diagnosis Date Noted  . Chest pain  03/26/2019  . 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 Chronic pain syndrome 09/26/2015  . De Quervain's tenosynovitis, right 09/13/2015  . Anemia due to antineoplastic chemotherapy 08/15/2015  . DLBCL (diffuse large B cell lymphoma) ([facility]) 08/15/2015  . Skin lesion of face 05/21/2015  . Peripheral neuropathy due to chemotherapy ([facility]) 02/09/2015  . Benign prostatic hyperplasia with urinary obstruction 02/09/2015  . Bilateral hearing loss 02/09/2015  . Low back pain 02/09/2015  . Chronic kidney disease (CKD), stage III (moderate) 02/09/2015  . Essential (primary) hypertension 02/09/2015  . Gastro-esophageal reflux disease without esophagitis 02/09/2015  . Anxiety, generalized 02/09/2015  . Cannot sleep 02/09/2015  . Combined fat and carbohydrate induced hyperlipemia 02/09/2015   Past Medical History:  Diagnosis Date  . Anemia   . Anemia due to antineoplastic chemotherapy 08/15/2015  . Anxiety   . Arthritis   . Cataract   . Chronic kidney disease (CKD), stage III (moderate) 02/09/2015   Controlled  . CMML (chronic myelomonocytic leukemia) ([facility])    gets Aranesp about monthly, otherwise surveillance with Dr. Marin Olp  . Combined fat and carbohydrate induced hyperlipemia 02/09/2015   Controlled  . Depression   . DLBCL (diffuse large B cell lymphoma) ([facility]) 08/15/2015  . Essential (primary) hypertension 02/09/2015  . Gastro-esophageal reflux disease without esophagitis 02/09/2015   Controlled  . Tinnitus     Family History  Problem Relation Age of Onset  . Anuerysm Mother 21       Deceased  . Colon cancer Father 68       Deceased  . Colon cancer Brother   . Prostate cancer Brother        Deceased  . Alzheimer's disease Brother   . Heart disease Brother   . Alzheimer's disease Paternal Grandmother   . Early death Maternal Grandmother        Unknown  . Obesity Son   . Obesity Son        Gastric Bypass  . Healthy Son   . Healthy Daughter    . Diabetes Neg Hx     Past Surgical History:  Procedure Laterality Date  . CATARACT EXTRACTION, BILATERAL    . COLONOSCOPY  May 2011  . CYSTOSCOPY    . TOTAL HIP ARTHROPLASTY  2003   Right   . TOTAL HIP ARTHROPLASTY  April, 2011   Left  . TRIGGER FINGER RELEASE  Nov 2013  . TURBINECTOMY  2006  . UPPER GI ENDOSCOPY  Nov 2015   Social History   Occupational History  . Not on file  Tobacco Use  . Smoking status: Former Research scientist (life sciences)  .  Smokeless tobacco: Never Used  . Tobacco comment: Quit >50 years ago  Substance and Sexual Activity  . Alcohol use: Yes    Alcohol/week: 1.0 - 4.0 standard drinks    Types: 1 - 4 Shots of liquor per week    Comment: 4 glasses per week  . Drug use: No  . Sexual activity: Not on file

## 2019-06-08 ENCOUNTER — Telehealth: Payer: Self-pay | Admitting: *Deleted

## 2019-06-08 NOTE — Telephone Encounter (Signed)
Call received from patient stating that he had a COVID exposure on 06/03/19 and would like to know what he needs to do in regards to his upcoming appt on 06/10/19.  Pt notified that if he continues to test negative for COVID, he will need to move his appt out by one week, which would be two weeks after his exposure.  Pt appreciative of assistance and transferred to scheduling.

## 2019-06-10 ENCOUNTER — Ambulatory Visit: Payer: Medicare Other | Admitting: Family

## 2019-06-10 ENCOUNTER — Other Ambulatory Visit: Payer: Medicare Other

## 2019-06-10 ENCOUNTER — Ambulatory Visit: Payer: Medicare Other

## 2019-06-11 ENCOUNTER — Encounter: Payer: Self-pay | Admitting: Family Medicine

## 2019-06-11 ENCOUNTER — Encounter: Payer: Self-pay | Admitting: Hematology & Oncology

## 2019-06-15 ENCOUNTER — Telehealth: Payer: Self-pay | Admitting: *Deleted

## 2019-06-15 NOTE — Telephone Encounter (Signed)
Ok, tell him bad time of year in terms of getting in, sorry.

## 2019-06-16 NOTE — Telephone Encounter (Signed)
Left message #1

## 2019-06-17 ENCOUNTER — Encounter: Payer: Self-pay | Admitting: Family

## 2019-06-17 ENCOUNTER — Other Ambulatory Visit: Payer: Self-pay

## 2019-06-17 ENCOUNTER — Inpatient Hospital Stay (HOSPITAL_BASED_OUTPATIENT_CLINIC_OR_DEPARTMENT_OTHER): Payer: Medicare Other | Admitting: Family

## 2019-06-17 ENCOUNTER — Inpatient Hospital Stay: Payer: Medicare Other

## 2019-06-17 ENCOUNTER — Inpatient Hospital Stay: Payer: Medicare Other | Attending: Hematology & Oncology

## 2019-06-17 VITALS — BP 119/54 | HR 74 | Temp 97.5°F | Resp 18 | Ht 69.0 in | Wt 209.0 lb

## 2019-06-17 DIAGNOSIS — Z886 Allergy status to analgesic agent status: Secondary | ICD-10-CM | POA: Diagnosis not present

## 2019-06-17 DIAGNOSIS — T451X5A Adverse effect of antineoplastic and immunosuppressive drugs, initial encounter: Secondary | ICD-10-CM | POA: Insufficient documentation

## 2019-06-17 DIAGNOSIS — C9310 Chronic myelomonocytic leukemia not having achieved remission: Secondary | ICD-10-CM | POA: Diagnosis present

## 2019-06-17 DIAGNOSIS — Z79899 Other long term (current) drug therapy: Secondary | ICD-10-CM | POA: Insufficient documentation

## 2019-06-17 DIAGNOSIS — M549 Dorsalgia, unspecified: Secondary | ICD-10-CM | POA: Insufficient documentation

## 2019-06-17 DIAGNOSIS — G629 Polyneuropathy, unspecified: Secondary | ICD-10-CM | POA: Insufficient documentation

## 2019-06-17 DIAGNOSIS — Z8572 Personal history of non-Hodgkin lymphomas: Secondary | ICD-10-CM | POA: Diagnosis not present

## 2019-06-17 DIAGNOSIS — D6481 Anemia due to antineoplastic chemotherapy: Secondary | ICD-10-CM

## 2019-06-17 DIAGNOSIS — Z888 Allergy status to other drugs, medicaments and biological substances status: Secondary | ICD-10-CM | POA: Diagnosis not present

## 2019-06-17 DIAGNOSIS — D696 Thrombocytopenia, unspecified: Secondary | ICD-10-CM

## 2019-06-17 DIAGNOSIS — Z881 Allergy status to other antibiotic agents status: Secondary | ICD-10-CM | POA: Diagnosis not present

## 2019-06-17 DIAGNOSIS — C8330 Diffuse large B-cell lymphoma, unspecified site: Secondary | ICD-10-CM

## 2019-06-17 LAB — LACTATE DEHYDROGENASE: LDH: 184 U/L (ref 98–192)

## 2019-06-17 LAB — CMP (CANCER CENTER ONLY)
ALT: 10 U/L (ref 0–44)
AST: 14 U/L — ABNORMAL LOW (ref 15–41)
Albumin: 4 g/dL (ref 3.5–5.0)
Alkaline Phosphatase: 59 U/L (ref 38–126)
Anion gap: 8 (ref 5–15)
BUN: 26 mg/dL — ABNORMAL HIGH (ref 8–23)
CO2: 29 mmol/L (ref 22–32)
Calcium: 9.1 mg/dL (ref 8.9–10.3)
Chloride: 103 mmol/L (ref 98–111)
Creatinine: 1.52 mg/dL — ABNORMAL HIGH (ref 0.61–1.24)
GFR, Est AFR Am: 46 mL/min — ABNORMAL LOW (ref >60)
GFR, Estimated: 40 mL/min — ABNORMAL LOW (ref >60)
Glucose, Bld: 106 mg/dL — ABNORMAL HIGH (ref 70–99)
Potassium: 4 mmol/L (ref 3.5–5.1)
Sodium: 140 mmol/L (ref 135–145)
Total Bilirubin: 0.9 mg/dL (ref 0.3–1.2)
Total Protein: 7 g/dL (ref 6.5–8.1)

## 2019-06-17 LAB — CBC WITH DIFFERENTIAL (CANCER CENTER ONLY)
Abs Immature Granulocytes: 0.04 10*3/uL (ref 0.00–0.07)
Basophils Absolute: 0 10*3/uL (ref 0.0–0.1)
Basophils Relative: 1 %
Eosinophils Absolute: 0.1 10*3/uL (ref 0.0–0.5)
Eosinophils Relative: 1 %
HCT: 29.8 % — ABNORMAL LOW (ref 39.0–52.0)
Hemoglobin: 9.7 g/dL — ABNORMAL LOW (ref 13.0–17.0)
Immature Granulocytes: 1 %
Lymphocytes Relative: 33 %
Lymphs Abs: 2 10*3/uL (ref 0.7–4.0)
MCH: 37 pg — ABNORMAL HIGH (ref 26.0–34.0)
MCHC: 32.6 g/dL (ref 30.0–36.0)
MCV: 113.7 fL — ABNORMAL HIGH (ref 80.0–100.0)
Monocytes Absolute: 2.5 10*3/uL — ABNORMAL HIGH (ref 0.1–1.0)
Monocytes Relative: 39 %
Neutro Abs: 1.5 10*3/uL — ABNORMAL LOW (ref 1.7–7.7)
Neutrophils Relative %: 25 %
Platelet Count: 87 10*3/uL — ABNORMAL LOW (ref 150–400)
RBC: 2.62 MIL/uL — ABNORMAL LOW (ref 4.22–5.81)
RDW: 15.7 % — ABNORMAL HIGH (ref 11.5–15.5)
WBC Count: 6.1 10*3/uL (ref 4.0–10.5)
nRBC: 0 % (ref 0.0–0.2)

## 2019-06-17 LAB — RETICULOCYTES
Immature Retic Fract: 17.1 % — ABNORMAL HIGH (ref 2.3–15.9)
RBC.: 2.63 MIL/uL — ABNORMAL LOW (ref 4.22–5.81)
Retic Count, Absolute: 27.1 10*3/uL (ref 19.0–186.0)
Retic Ct Pct: 1 % (ref 0.4–3.1)

## 2019-06-17 MED ORDER — DARBEPOETIN ALFA 500 MCG/ML IJ SOSY
500.0000 ug | PREFILLED_SYRINGE | Freq: Once | INTRAMUSCULAR | Status: AC
  Administered 2019-06-17: 12:00:00 500 ug via SUBCUTANEOUS

## 2019-06-17 NOTE — Telephone Encounter (Signed)
Scheduled for 1/6 at 1030.

## 2019-06-17 NOTE — Patient Instructions (Signed)
Darbepoetin Alfa injection What is this medicine? DARBEPOETIN ALFA (dar be POE e tin AL fa) helps your body make more red blood cells. It is used to treat anemia caused by chronic kidney failure and chemotherapy. This medicine may be used for other purposes; ask your health care provider or pharmacist if you have questions. COMMON BRAND NAME(S): Aranesp What should I tell my health care provider before I take this medicine? They need to know if you have any of these conditions:  blood clotting disorders or history of blood clots  cancer patient not on chemotherapy  cystic fibrosis  heart disease, such as angina, heart failure, or a history of a heart attack  hemoglobin level of 12 g/dL or greater  high blood pressure  low levels of folate, iron, or vitamin B12  seizures  an unusual or allergic reaction to darbepoetin, erythropoietin, albumin, hamster proteins, latex, other medicines, foods, dyes, or preservatives  pregnant or trying to get pregnant  breast-feeding How should I use this medicine? This medicine is for injection into a vein or under the skin. It is usually given by a health care professional in a hospital or clinic setting. If you get this medicine at home, you will be taught how to prepare and give this medicine. Use exactly as directed. Take your medicine at regular intervals. Do not take your medicine more often than directed. It is important that you put your used needles and syringes in a special sharps container. Do not put them in a trash can. If you do not have a sharps container, call your pharmacist or healthcare provider to get one. A special MedGuide will be given to you by the pharmacist with each prescription and refill. Be sure to read this information carefully each time. Talk to your pediatrician regarding the use of this medicine in children. While this medicine may be used in children as young as 1 month of age for selected conditions, precautions do  apply. Overdosage: If you think you have taken too much of this medicine contact a poison control center or emergency room at once. NOTE: This medicine is only for you. Do not share this medicine with others. What if I miss a dose? If you miss a dose, take it as soon as you can. If it is almost time for your next dose, take only that dose. Do not take double or extra doses. What may interact with this medicine? Do not take this medicine with any of the following medications:  epoetin alfa This list may not describe all possible interactions. Give your health care provider a list of all the medicines, herbs, non-prescription drugs, or dietary supplements you use. Also tell them if you smoke, drink alcohol, or use illegal drugs. Some items may interact with your medicine. What should I watch for while using this medicine? Your condition will be monitored carefully while you are receiving this medicine. You may need blood work done while you are taking this medicine. This medicine may cause a decrease in vitamin B6. You should make sure that you get enough vitamin B6 while you are taking this medicine. Discuss the foods you eat and the vitamins you take with your health care professional. What side effects may I notice from receiving this medicine? Side effects that you should report to your doctor or health care professional as soon as possible:  allergic reactions like skin rash, itching or hives, swelling of the face, lips, or tongue  breathing problems  changes in   vision  chest pain  confusion, trouble speaking or understanding  feeling faint or lightheaded, falls  high blood pressure  muscle aches or pains  pain, swelling, warmth in the leg  rapid weight gain  severe headaches  sudden numbness or weakness of the face, arm or leg  trouble walking, dizziness, loss of balance or coordination  seizures (convulsions)  swelling of the ankles, feet, hands  unusually weak or  tired Side effects that usually do not require medical attention (report to your doctor or health care professional if they continue or are bothersome):  diarrhea  fever, chills (flu-like symptoms)  headaches  nausea, vomiting  redness, stinging, or swelling at site where injected This list may not describe all possible side effects. Call your doctor for medical advice about side effects. You may report side effects to FDA at 1-800-FDA-1088. Where should I keep my medicine? Keep out of the reach of children. Store in a refrigerator between 2 and 8 degrees C (36 and 46 degrees F). Do not freeze. Do not shake. Throw away any unused portion if using a single-dose vial. Throw away any unused medicine after the expiration date. NOTE: This sheet is a summary. It may not cover all possible information. If you have questions about this medicine, talk to your doctor, pharmacist, or health care provider.  2020 Elsevier/Gold Standard (2017-06-26 16:44:20)  

## 2019-06-17 NOTE — Progress Notes (Signed)
Hematology and Oncology Follow Up Visit  Jeremiah Walker [id number] Sep 03, 1927 85 y.o. 06/17/2019   Principle Diagnosis:  History of diffuse large cell non-Hodgkin's lymphoma-treated in [place] Chronic Myelomonocytic Leukemia (CMMoL) - Normal cytogenetics  Current Therapy:   Aranesp 300 mcg sq q 3 weeks for Hgb < 11   Interim History:  Jeremiah Walker is here today for follow-up. He is doing well but states that he is still having issues with back pain. He has an appointment with Dr. [name] for dry needling on January 6th. Hopefully this will help.  Hgb today is 9.7, MCV 113, platelets 87.  He does bruise easily but has not had any issues with bleeding pr petechial rash.  He states that he really does feel good and denies fatigue at this time.  No fever, chills, n/v, cough, rash, dizziness, SOB, chest pain, palpitations, abdominal pain or changes in bowel or bladder habits.  He still has neuropathy in his fingertips since chemo. This is stable and unchanged.  No swelling in his extremities at this time.  He ambulates with a cane for added support.  No falls or syncopal episodes to report.  He states that he has a good appetite and is staying well hydrated. His weight is stable.   ECOG Performance Status: 1 - Symptomatic but completely ambulatory  Medications:  Allergies as of 06/17/2019      Reactions   Nsaids Other (See Comments), Tinitus   Bruising   Statins Nausea And Vomiting, Other (See Comments)   Other reaction(s): Kidney issues Muscle pain   Tolmetin Other (See Comments)   Bruising   Ciprofloxacin Other (See Comments)   Other reaction(s):Achilles tendon   Ropinirole Anxiety   Mood swing   Nitroglycerin    Requip [ropinirole Hcl] Anxiety      Medication List       Accurate as of June 17, 2019 11:41 AM. If you have any questions, ask your nurse or doctor.        allopurinol 100 MG tablet Commonly known as: ZYLOPRIM TAKE 2 TABLETS(200 MG) BY MOUTH DAILY   ALPRAZolam 0.5 MG tablet Commonly known as: XANAX TAKE 1 TABLET(0.5 MG) BY MOUTH THREE TIMES DAILY AS NEEDED FOR ANXIETY What changed:   how much to take  how to take this  when to take this  reasons to take this  additional instructions   amoxicillin 500 MG capsule Commonly known as: AMOXIL Take 4 caps (2gm) by mouth once prior to dental procedure   azelastine 0.1 % nasal spray Commonly known as: ASTELIN Place 2 sprays into both nostrils 2 (two) times daily. Use in each nostril as directed   b complex vitamins tablet Take 1 tablet by mouth daily.   bethanechol 25 MG tablet Commonly known as: URECHOLINE TAKE 1 TABLET(25 MG) BY MOUTH THREE TIMES DAILY   Calcium 500 MG tablet Take 600 mg by mouth daily.   folic acid 1 MG tablet Commonly known as: FOLVITE TAKE 2 TABLETS(2 MG) BY MOUTH DAILY What changed: See the new instructions.   HYDROcodone-acetaminophen 7.5-325 MG tablet Commonly known as: NORCO Take 1 tablet by mouth every 6 (six) hours as needed for moderate pain.   Magnesium Oxide 200 MG Tabs Take 200 mg by mouth daily.   Melatonin 10 MG Tabs Take 20 mg by mouth. Take 20 mg daily as needed at bedtime.   multivitamin tablet Take 1 tablet by mouth daily.   OMEGA-3 FISH OIL PO Take 2 capsules by mouth daily.  EPA/DHA 520/350   polyethylene glycol 17 g packet Commonly known as: MIRALAX / GLYCOLAX Take 17 g by mouth daily as needed for mild constipation.   predniSONE 20 MG tablet Commonly known as: DELTASONE May take 1 tablet daily for up to 3 days as needed for gout attack or muscle spasm   pregabalin 25 MG capsule Commonly known as: Lyrica Take 1 capsule (25 mg total) by mouth 3 (three) times daily.   psyllium 58.6 % powder Commonly known as: METAMUCIL Take 1 packet by mouth daily.   UNABLE TO FIND Take 1 tablet by mouth daily. Med Name: B-6 VITAMIN   vitamin C 500 MG tablet Commonly known as: ASCORBIC ACID Take 500 mg by mouth daily.     Vitamin D3 50 MCG (2000 UT) capsule Take 2,000 Units by mouth daily.   zinc gluconate 50 MG tablet Take 50 mg by mouth daily.       Allergies:  Allergies  Allergen Reactions  . Nsaids Other (See Comments) and Tinitus    Bruising   . Statins Nausea And Vomiting and Other (See Comments)    Other reaction(s): Kidney issues Muscle pain   . Tolmetin Other (See Comments)    Bruising  . Ciprofloxacin Other (See Comments)    Other reaction(s):Achilles tendon   . Ropinirole Anxiety    Mood swing  . Nitroglycerin   . Requip [Ropinirole Hcl] Anxiety    Past Medical History, Surgical history, Social history, and Family History were reviewed and updated.  Review of Systems: All other 10 point review of systems is negative.   Physical Exam:  vitals were not taken for this visit.   Wt Readings from Last 3 Encounters:  05/28/19 202 lb (91.6 kg)  05/19/19 202 lb (91.6 kg)  04/30/19 208 lb 4 oz (94.5 kg)     Lab Results  Component Value Date   WBC 6.1 06/17/2019   HGB 9.7 (L) 06/17/2019   HCT 29.8 (L) 06/17/2019   MCV 113.7 (H) 06/17/2019   PLT 87 (L) 06/17/2019   Lab Results  Component Value Date   FERRITIN 342 (H) 05/19/2019   IRON 93 05/19/2019   TIBC 253 05/19/2019   UIBC 160 05/19/2019   IRONPCTSAT 37 05/19/2019   Lab Results  Component Value Date   RETICCTPCT 1.0 06/17/2019   RBC 2.63 (L) 06/17/2019   RBC 2.62 (L) 06/17/2019   No results found for: KPAFRELGTCHN, LAMBDASER, KAPLAMBRATIO No results found for: IGGSERUM, IGA, IGMSERUM No results found for: Odetta Pink, SPEI   Chemistry      Component Value Date/Time   NA 138 05/19/2019 1157   NA 139 05/08/2017 1123   NA 141 05/15/2016 1126   K 5.1 05/19/2019 1157   K 4.0 05/08/2017 1123   K 4.3 05/15/2016 1126   CL 103 05/19/2019 1157   CL 105 05/08/2017 1123   CO2 28 05/19/2019 1157   CO2 30 05/08/2017 1123   CO2 26 05/15/2016 1126   BUN 28  (H) 05/19/2019 1157   BUN 15 05/08/2017 1123   BUN 29.1 (H) 05/15/2016 1126   CREATININE 1.61 (H) 05/19/2019 1157   CREATININE 1.2 05/08/2017 1123   CREATININE 1.7 (H) 05/15/2016 1126   GLU 90 11/15/2014 0000      Component Value Date/Time   CALCIUM 9.5 05/19/2019 1157   CALCIUM 9.1 05/08/2017 1123   CALCIUM 9.7 05/15/2016 1126   ALKPHOS 56 05/19/2019 1157   ALKPHOS 64 05/08/2017  0454   UJWJXBJ 47 05/15/2016 1126   AST 14 (L) 05/19/2019 1157   AST 23 05/15/2016 1126   ALT 10 05/19/2019 1157   ALT 19 05/08/2017 1123   ALT 22 05/15/2016 1126   BILITOT 0.9 05/19/2019 1157   BILITOT 0.84 05/15/2016 1126       Impression and Plan: Mr. Reifsteck is a 85 yo caucasian gentleman with history of diffuse large cell non-Hodgkin's lymphoma treated with 6 cycles of R-CHOP completed in April 2014.  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  He received Aranesp today for Hgb 9.7.  We will plan to see him back in another month for follow-up.  He will contact our office with any questions or concerns. We can certainly see him sooner if needed.   Laverna Peace, NP 12/23/202011:41 AM

## 2019-06-18 ENCOUNTER — Telehealth: Payer: Self-pay | Admitting: Hematology & Oncology

## 2019-06-18 LAB — IRON AND TIBC
Iron: 92 ug/dL (ref 42–163)
Saturation Ratios: 39 % (ref 20–55)
TIBC: 234 ug/dL (ref 202–409)
UIBC: 142 ug/dL (ref 117–376)

## 2019-06-18 LAB — FERRITIN: Ferritin: 383 ng/mL — ABNORMAL HIGH (ref 24–336)

## 2019-06-18 NOTE — Telephone Encounter (Signed)
LMVM and mailed calendar per 12/23 los

## 2019-06-23 DIAGNOSIS — Z23 Encounter for immunization: Secondary | ICD-10-CM | POA: Diagnosis not present

## 2019-06-25 ENCOUNTER — Other Ambulatory Visit: Payer: Self-pay

## 2019-06-27 NOTE — Progress Notes (Signed)
[facility], [place], [place] [address] [phone]  Date:  06/29/2019   Name:  Jeremiah Walker   DOB:  1928-02-13   MRN:  [id number]  PCP:  [name], MD    Chief Complaint: Fatigue (excess sleeping, multiple naps a day)   History of Present Illness:  Jeremiah Walker is a 86 y.o. very pleasant male patient who presents with the following:  Pt with history of chronic back pain, NHL/CML, BPH, CKD, HTN, peripheral neuropathy due to chemotherapy Retired English as a second language teacher, he is very mentally sharp about his physical abilities are limited by back pain Here today with concern of being sleepy/difficulty falling asleep  Most recent labs just a few weeks ago per hematology, stable renal function and anemia He had an Aranesp infusion on 12/23- he gets this every 3-4 weeks For his heart he is seeing Dr Gayla Doss Followed by nephrology- Dr Deterdine, last visit over the summer  Last seen by myself in October, at which time he was having a back pain flare and we temporarily used oxycodone 10 mg - he typically uses oxycodone 7.5, about 100 per month  He also uses alprazolam 0.5 TID Last UDS was in October 06/14/2019  1   02/26/2019  Alprazolam 0.5 MG Tablet  90.00  30 Je Cop   [id number]   [facility] ([id number])   0  3.00 LME  Medicare   [place]  05/27/2019  1   05/26/2019  Hydrocodone-Acetamin 7.5-325  100.00  25 Je Cop   [id number]   [facility] ([id number])   0  30.00 MME  Comm Ins   [place]  05/11/2019  1   03/04/2019  Pregabalin 25 MG Capsule  90.00  30 Je Cop   [id number]   [facility] ([id number])   2  0.50 LME  Comm Ins   [place]  05/03/2019  1   02/26/2019  Alprazolam 0.5 MG Tablet  90.00  30 Je Cop   [id number]   [facility] ([id number])   0  3.00 LME  Comm Ins   [place]  04/08/2019  1   04/08/2019  Oxycodone-Acetaminophen 10-325  15.00  5 Je Cop   [id number]   [facility] ([id number])   0  45.00 MME  Comm Ins   [place]  04/07/2019  1   03/04/2019  Pregabalin 25 MG Capsule  90.00  30 Je Cop   [id number]   [facility] ([id number])   1   0.50 LME  Comm Ins   [place]  03/29/2019  1   02/26/2019  Alprazolam 0.5 MG Tablet  90.00  30 Je Cop   [id number]   [facility] ([id number])   1  3.00 LME  Comm Ins   [place]  03/25/2019  1   03/23/2019  Hydrocodone-Acetamin 7.5-325  100.00  25 Je Cop   [id number]   [facility] ([id number])   0  30.00 MME  Comm Ins   [place]  03/05/2019  1   03/04/2019  Pregabalin 25 MG Capsule  90.00  30 Je Cop   [id number]   [facility] ([id number])   0  0.50 LME       He is seeing his spine doctor, Dr [name], this week   He notes that he does pool exercise at his facility a few times a week. One of his exercise partners tested positive for covid in mid December He himself tested negative and did a 10 day quarantine  He visited his  son over christmas- while driving himself home he started to feel sleepy.  This is unusual for him Over the next 4 days he took several naps a day- felt very tired.   He is using an OTC sleep aid- melatonin- and this seems to be helping him get to sleep more easily/ quickly.  He also takes his alprazolam at bedtime He sleeps about 7 hours a night- sometimes less.  He would like to try and get more sleep, thinks this may help his energy level.  But he is not sure how to go back again to sleep earlier.  He currently goes to sleep around midnight, typically wakes up at 7 if not earlier.  He does not typically take naps.  He oftentimes sleeps in a recliner as this is more comfortable for his back He takes his alprazolam, and over-the-counter sleep aids at around midnight  He has a harder time walking due to his back pain and leg length discrepancy. He uses a shoe lift on the left side. He has not been stressed or ill as far as he is aware    Wt Readings from Last 3 Encounters:  06/29/19 202 lb (91.6 kg)  06/17/19 209 lb (94.8 kg)  05/28/19 202 lb (91.6 kg)   He has lost about 10 lbs recently intentionally He cut back on his caloric intake, is pleased with his progress  Patient Active Problem List   Diagnosis Date Noted  . Chest pain 03/26/2019   . 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 Chronic pain syndrome 09/26/2015  . De Quervain's tenosynovitis, right 09/13/2015  . Anemia due to antineoplastic chemotherapy 08/15/2015  . DLBCL (diffuse large B cell lymphoma) ([place]) 08/15/2015  . Skin lesion of face 05/21/2015  . Peripheral neuropathy due to chemotherapy ([place]) 02/09/2015  . Benign prostatic hyperplasia with urinary obstruction 02/09/2015  . Bilateral hearing loss 02/09/2015  . Low back pain 02/09/2015  . Chronic kidney disease (CKD), stage III (moderate) 02/09/2015  . Essential (primary) hypertension 02/09/2015  . Gastro-esophageal reflux disease without esophagitis 02/09/2015  . Anxiety, generalized 02/09/2015  . Cannot sleep 02/09/2015  . Combined fat and carbohydrate induced hyperlipemia 02/09/2015    Past Medical History:  Diagnosis Date  . Anemia   . Anemia due to antineoplastic chemotherapy 08/15/2015  . Anxiety   . Arthritis   . Cataract   . Chronic kidney disease (CKD), stage III (moderate) 02/09/2015   Controlled  . CMML (chronic myelomonocytic leukemia) ([place])    gets Aranesp about monthly, otherwise surveillance with Dr. Marin Olp  . Combined fat and carbohydrate induced hyperlipemia 02/09/2015   Controlled  . Depression   . DLBCL (diffuse large B cell lymphoma) ([facility]) 08/15/2015  . Essential (primary) hypertension 02/09/2015  . Gastro-esophageal reflux disease without esophagitis 02/09/2015   Controlled  . Tinnitus     Past Surgical History:  Procedure Laterality Date  . CATARACT EXTRACTION, BILATERAL    . COLONOSCOPY  May 2011  . CYSTOSCOPY    . TOTAL HIP ARTHROPLASTY  2003   Right   . TOTAL HIP ARTHROPLASTY  April, 2011   Left  . TRIGGER FINGER RELEASE  Nov 2013  . TURBINECTOMY  2006  . UPPER GI ENDOSCOPY  Nov 2015    Social History   Tobacco Use  . Smoking status: Former Research scientist (life sciences)  . Smokeless tobacco: Never Used  . Tobacco comment: Quit >50 years ago   Substance Use Topics  . Alcohol use: Yes  Alcohol/week: 1.0 - 4.0 standard drinks    Types: 1 - 4 Shots of liquor per week    Comment: 4 glasses per week  . Drug use: No    Family History  Problem Relation Age of Onset  . Anuerysm Mother 24       Deceased  . Colon cancer Father 53       Deceased  . Colon cancer Brother   . Prostate cancer Brother        Deceased  . Alzheimer's disease Brother   . Heart disease Brother   . Alzheimer's disease Paternal Grandmother   . Early death Maternal Grandmother        Unknown  . Obesity Son   . Obesity Son        Gastric Bypass  . Healthy Son   . Healthy Daughter   . Diabetes Neg Hx     Allergies  Allergen Reactions  . Nsaids Other (See Comments) and Tinitus    Bruising   . Statins Nausea And Vomiting and Other (See Comments)    Other reaction(s): Kidney issues Muscle pain   . Tolmetin Other (See Comments)    Bruising  . Ciprofloxacin Other (See Comments)    Other reaction(s):Achilles tendon   . Ropinirole Anxiety    Mood swing  . Nitroglycerin   . Requip [Ropinirole Hcl] Anxiety    Medication list has been reviewed and updated.  Current Outpatient Medications on File Prior to Visit  Medication Sig Dispense Refill  . allopurinol (ZYLOPRIM) 100 MG tablet TAKE 2 TABLETS(200 MG) BY MOUTH DAILY 180 tablet 3  . ALPRAZolam (XANAX) 0.5 MG tablet TAKE 1 TABLET(0.5 MG) BY MOUTH THREE TIMES DAILY AS NEEDED FOR ANXIETY (Patient taking differently: Take 0.5 mg by mouth 3 (three) times daily as needed for anxiety. ) 90 tablet 3  . amoxicillin (AMOXIL) 500 MG capsule Take 4 caps (2gm) by mouth once prior to dental procedure 20 capsule 0  . azelastine (ASTELIN) 0.1 % nasal spray Place 2 sprays into both nostrils 2 (two) times daily. Use in each nostril as directed 30 mL 11  . b complex vitamins tablet Take 1 tablet by mouth daily.    . bethanechol (URECHOLINE) 25 MG tablet TAKE 1 TABLET(25 MG) BY MOUTH THREE TIMES DAILY 90 tablet  11  . Calcium 500 MG tablet Take 600 mg by mouth daily.    . Cholecalciferol (VITAMIN D3) 2000 UNITS capsule Take 2,000 Units by mouth daily.    . folic acid (FOLVITE) 1 MG tablet TAKE 2 TABLETS(2 MG) BY MOUTH DAILY (Patient taking differently: Take 2 mg by mouth daily. ) 60 tablet 4  . HYDROcodone-acetaminophen (NORCO) 7.5-325 MG tablet Take 1 tablet by mouth every 6 (six) hours as needed for moderate pain. 100 tablet 0  . Magnesium Oxide 200 MG TABS Take 200 mg by mouth daily.     . Melatonin 10 MG TABS Take 20 mg by mouth. Take 20 mg daily as needed at bedtime.    . Multiple Vitamin (MULTIVITAMIN) tablet Take 1 tablet by mouth daily.    . Omega-3 Fatty Acids (OMEGA-3 FISH OIL PO) Take 2 capsules by mouth daily. EPA/DHA 520/350     . polyethylene glycol (MIRALAX / GLYCOLAX) packet Take 17 g by mouth daily as needed for mild constipation.     . predniSONE (DELTASONE) 20 MG tablet May take 1 tablet daily for up to 3 days as needed for gout attack or muscle spasm 20 tablet  1  . pregabalin (LYRICA) 25 MG capsule Take 1 capsule (25 mg total) by mouth 3 (three) times daily. 90 capsule 3  . UNABLE TO FIND Take 1 tablet by mouth daily. Med Name: B-6 VITAMIN     . vitamin C (ASCORBIC ACID) 500 MG tablet Take 500 mg by mouth daily.    Marland Kitchen zinc gluconate 50 MG tablet Take 50 mg by mouth daily.     No current facility-administered medications on file prior to visit.    Review of Systems:  As per HPI- otherwise negative.  He is doing his pool exercises but the gym is not open -he misses going to the gym Physical Examination: Vitals:   06/29/19 1035  BP: 134/70  Pulse: 84  Resp: 16  Temp: (!) 97.1 F (36.2 C)  SpO2: 98%   Vitals:   06/29/19 1035  Weight: 202 lb (91.6 kg)  Height: 5\' 9"  (1.753 m)   Body mass index is 29.83 kg/m. Ideal Body Weight: Weight in (lb) to have BMI = 25: 168.9  GEN: WDWN, NAD, Non-toxic, A & O x 3, central obesity, looks well HEENT: Atraumatic, Normocephalic.  Neck supple. No masses, No LAD. Ears and Nose: No external deformity. CV: RRR, No M/G/R. No JVD. No thrill. No extra heart sounds. PULM: CTA B, no wheezes, crackles, rhonchi. No retractions. No resp. distress. No accessory muscle use. ABD: S, NT, ND, +BS. No rebound. No HSM. EXTR: No c/c/e NEURO Normal gait for patient, uses cane.  He uses a walker for longer distances PSYCH: Normally interactive. Conversant. Not depressed or anxious appearing.  Calm demeanor.    Assessment and Plan: Fatigue, unspecified type  Chronic pain syndrome  Chronic left-sided low back pain without sciatica  Other polyneuropathy  Here today with a couple of concerns.  Had noted more fatigue for several days following Christmas, he is now back to his baseline.  However he still wishes that he could get more sleep.  We discussed strategies to increase his total sleep time, encouraged him to gradually move his bedtime earlier with a goal of getting at least 7 to 8 hours per night.  He also can use naps to catch up as needed.  He will work on this, and let me know how it goes via [place] in a few weeks Reviewed his controlled substance database-nothing unexpected This visit occurred during the SARS-CoV-2 public health emergency.  Safety protocols were in place, including screening questions prior to the visit, additional usage of staff PPE, and extensive cleaning of exam room while observing appropriate contact time as indicated for disinfecting solutions.    Signed Lamar Blinks, MD

## 2019-06-29 ENCOUNTER — Encounter: Payer: Self-pay | Admitting: Family Medicine

## 2019-06-29 ENCOUNTER — Other Ambulatory Visit: Payer: Self-pay

## 2019-06-29 ENCOUNTER — Ambulatory Visit (INDEPENDENT_AMBULATORY_CARE_PROVIDER_SITE_OTHER): Payer: Medicare Other | Admitting: Family Medicine

## 2019-06-29 VITALS — BP 134/70 | HR 84 | Temp 97.1°F | Resp 16 | Ht 69.0 in | Wt 202.0 lb

## 2019-06-29 DIAGNOSIS — G6289 Other specified polyneuropathies: Secondary | ICD-10-CM

## 2019-06-29 DIAGNOSIS — G894 Chronic pain syndrome: Secondary | ICD-10-CM | POA: Diagnosis not present

## 2019-06-29 DIAGNOSIS — R5383 Other fatigue: Secondary | ICD-10-CM

## 2019-06-29 DIAGNOSIS — M545 Low back pain: Secondary | ICD-10-CM

## 2019-06-29 DIAGNOSIS — G8929 Other chronic pain: Secondary | ICD-10-CM

## 2019-06-29 NOTE — Patient Instructions (Signed)
It was great to see you again today, but I am sorry your back is bothering you so much As we discussed, I think some of your fatigue may be from not getting quite enough sleep. Please get ready for bed, and then take your bedtime medications at about 11 PM.  Then get into bed and read something that is not overly interesting.  I am hoping this will allow you to get to sleep a little bit earlier, perhaps 1130.  If you are successful, start the routine at 10:45, then 10:30, etc.  Please see me in about 6 months and take care

## 2019-07-01 ENCOUNTER — Other Ambulatory Visit: Payer: Self-pay

## 2019-07-01 ENCOUNTER — Encounter: Payer: Self-pay | Admitting: Physical Medicine and Rehabilitation

## 2019-07-01 ENCOUNTER — Ambulatory Visit (INDEPENDENT_AMBULATORY_CARE_PROVIDER_SITE_OTHER): Payer: Medicare Other | Admitting: Physical Medicine and Rehabilitation

## 2019-07-01 VITALS — BP 126/62 | HR 78

## 2019-07-01 DIAGNOSIS — M545 Low back pain, unspecified: Secondary | ICD-10-CM

## 2019-07-01 DIAGNOSIS — G620 Drug-induced polyneuropathy: Secondary | ICD-10-CM | POA: Diagnosis not present

## 2019-07-01 DIAGNOSIS — T451X5A Adverse effect of antineoplastic and immunosuppressive drugs, initial encounter: Secondary | ICD-10-CM

## 2019-07-01 DIAGNOSIS — Z96642 Presence of left artificial hip joint: Secondary | ICD-10-CM

## 2019-07-01 DIAGNOSIS — G8929 Other chronic pain: Secondary | ICD-10-CM

## 2019-07-01 DIAGNOSIS — M7918 Myalgia, other site: Secondary | ICD-10-CM

## 2019-07-01 NOTE — Progress Notes (Signed)
 .  Numeric Pain Rating Scale and Functional Assessment Average Pain 5 Pain Right Now 2 My pain is intermittent, constant and sharp Pain is worse with: walking and bending Pain improves with: medication and hot tubs   In the last MONTH (on 0-10 scale) has pain interfered with the following?  1. General activity like being  able to carry out your everyday physical activities such as walking, climbing stairs, carrying groceries, or moving a chair?  Rating(5)  2. Relation with others like being able to carry out your usual social activities and roles such as  activities at home, at work and in your community. Rating(5)  3. Enjoyment of life such that you have  been bothered by emotional problems such as feeling anxious, depressed or irritable?  Rating(1)

## 2019-07-02 ENCOUNTER — Encounter: Payer: Self-pay | Admitting: Physical Medicine and Rehabilitation

## 2019-07-02 ENCOUNTER — Encounter: Payer: Self-pay | Admitting: Family Medicine

## 2019-07-02 DIAGNOSIS — G894 Chronic pain syndrome: Secondary | ICD-10-CM

## 2019-07-02 DIAGNOSIS — Z96642 Presence of left artificial hip joint: Secondary | ICD-10-CM | POA: Diagnosis not present

## 2019-07-02 DIAGNOSIS — M7918 Myalgia, other site: Secondary | ICD-10-CM

## 2019-07-02 MED ORDER — TRIAMCINOLONE ACETONIDE 40 MG/ML IJ SUSP
10.0000 mg | INTRAMUSCULAR | Status: AC | PRN
  Administered 2019-07-02: 06:00:00 10 mg via INTRAMUSCULAR

## 2019-07-02 MED ORDER — HYDROCODONE-ACETAMINOPHEN 7.5-325 MG PO TABS: 1.0000 | ORAL_TABLET | Freq: Four times a day (QID) | ORAL | 0 refills | Status: DC | PRN

## 2019-07-02 MED ORDER — LIDOCAINE HCL 1 % IJ SOLN
3.0000 mL | INTRAMUSCULAR | Status: AC | PRN
  Administered 2019-07-02: 3 mL

## 2019-07-02 NOTE — Progress Notes (Signed)
Jeremiah Walker - 86 y.o. male MRN [id number]  Date of birth: [birth year]  Office Visit Note: Visit Date: 07/01/2019 PCP: [name], MD Referred by: [name], MD  Subjective: Chief Complaint  Patient presents with  . Lower Back - Pain   HPI: Jeremiah Walker is a 86 y.o. male who comes in today For reevaluation and management and repeat trigger point injection for chronic left-sided predominantly low back pain worse with ambulating and movement.  His pain is chronic long-term and significantly worsened over the last several months without injury.  He rates his pain is quite severe at times.  His situation has been worsened with coronavirus restrictions where he lives.  The gym has closed but he does do some home exercises.  He does try to get in the pool at times which is still open.  He is now ambulating more with a walker which is new for him.  He has not noted any focal weakness or paresthesias or red flag complaints.  Really similar symptoms when I saw him last time.  Trigger point injection last time did give him some relief but not as well as the first time that we did it.  He does have focal trigger points on exam.  MRI of the lumbar spine shows foraminal narrowing on the left at L2-3 and this could be a source of pain that we just have not approached yet because he did well with trigger point injection.  Facet joint block was also a little bit beneficial but his pain is more lateral than you would typically see with facet joints.  Nonetheless we will repeat the trigger point injection today.  Depending on relief would get him in with the physical therapist for dry needling.  Lastly would look at L2 transforaminal injection.  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  He also hasCMML (chronic myelomonocytic leukemia).  ROS Otherwise per HPI.  Assessment & Plan: Visit Diagnoses:  1. Chronic  left-sided low back pain without sciatica   2. Myofascial pain syndrome   3. H/O total hip arthroplasty, left   4. Peripheral neuropathy due to chemotherapy [facility])     Plan: No additional findings.   Meds & Orders: No orders of the defined types were placed in this encounter.   Orders Placed This Encounter  Procedures  . Trigger Point Inj    Follow-up: No follow-ups on file.   Procedures: Trigger Point Inj  Date/Time: 07/02/2019 5:34 AM Performed by: Magnus Sinning, MD Authorized by: Magnus Sinning, MD   Consent Given by:  Patient Site marked: the procedure site was marked   Timeout: prior to procedure the correct patient, procedure, and site was verified   Indications:  Pain Total # of Trigger Points:  3 or more Location: back   Needle Size:  25 G Approach:  Dorsal and lateral Medications #1:  10 mg triamcinolone acetonide 40 MG/ML; 3 mL lidocaine 1 % Patient tolerance:  Patient tolerated the procedure well with no immediate complications Comments: Trigger points palpated in the quadratus lumborum latissimus dorsi and spinal erector    No notes on file   Clinical History: Lumbar spine MRI dated 05/29/2016   L4-5: Anterolisthesis, uncovered disc with small bulge, central protrusion, moderate facet and ligamentum flavum hypertrophy, and large right foraminal and extra foraminal marginal osteophyte. Severe right foraminal narrowing. Mild left foraminal narrowing. Effacement of lateral recesses with contact upon the  descending right L5 nerve root. No significant canal stenosis.   L5-S1: Small disc bulge and mild facet/ligamentum flavum hypertrophy. Mild bilateral foraminal narrowing. Right-sided 5 mm T2 hyperintense structure arising from the right facet joint possibly impinging the exiting right L5 nerve root likely a synovial cyst  Mild S-shaped lumbar curvature, grade 1 L4-5 anterolisthesis, and prominent discogenic and facet arthropathy. No acute  osseous abnormality.  No high-grade canal stenosis. 3. Multiple levels of foraminal narrowing moderate at left L2-3, moderate at right L3-4, and severe at right L4-5 levels. 4. Large marginal osteophytes with contact upon exiting left L2 nerve root at the L2-3 level and right L3 nerve root at the L3-4 level.    He reports that he has quit smoking. He has never used smokeless tobacco.  Recent Labs    12/29/18 1605  LABURIC 6.1    Objective:  VS:  HT:    WT:   BMI:     BP:126/62  HR:78bpm  TEMP: ( )  RESP:  Physical Exam Vitals and nursing note reviewed.  Constitutional:      General: He is not in acute distress.    Appearance: Normal appearance. He is well-developed. He is obese.  HENT:     Head: Normocephalic and atraumatic.  Eyes:     Conjunctiva/sclera: Conjunctivae normal.     Pupils: Pupils are equal, round, and reactive to light.  Cardiovascular:     Rate and Rhythm: Normal rate.     Pulses: Normal pulses.     Heart sounds: Normal heart sounds.  Pulmonary:     Effort: Pulmonary effort is normal. No respiratory distress.  Musculoskeletal:     Cervical back: Normal range of motion and neck supple. No rigidity.     Right lower leg: No edema.     Left lower leg: No edema.     Comments: Ambulates with a walker with a forward flexed lumbar spine.  He has active trigger points in the left paraspinal quadratus and right above the iliac crest which is probably a tie-in from the latissimus dorsi.  He has no pain with hip rotation has good distal strength  Skin:    General: Skin is warm and dry.     Findings: No erythema or rash.  Neurological:     General: No focal deficit present.     Mental Status: He is alert and oriented to person, place, and time.     Sensory: No sensory deficit.     Coordination: Coordination normal.     Gait: Gait normal.  Psychiatric:        Mood and Affect: Mood normal.        Behavior: Behavior normal.     Ortho Exam Imaging: No  results found.  Past Medical/Family/Surgical/Social History: Medications & Allergies reviewed per EMR, new medications updated. Patient Active Problem List   Diagnosis Date Noted  . Chest pain 03/26/2019  . 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 Chronic pain syndrome 09/26/2015  . De Quervain's tenosynovitis, right 09/13/2015  . Anemia due to antineoplastic chemotherapy 08/15/2015  . DLBCL (diffuse large B cell lymphoma) ([facility]) 08/15/2015  . Skin lesion of face 05/21/2015  . Peripheral neuropathy due to chemotherapy ([place]) 02/09/2015  . Benign prostatic hyperplasia with urinary obstruction 02/09/2015  . Bilateral hearing loss 02/09/2015  . Low back pain 02/09/2015  . Chronic kidney disease (CKD), stage III (moderate) 02/09/2015  . Essential (primary) hypertension 02/09/2015  . Gastro-esophageal reflux disease without  esophagitis 02/09/2015  . Anxiety, generalized 02/09/2015  . Cannot sleep 02/09/2015  . Combined fat and carbohydrate induced hyperlipemia 02/09/2015   Past Medical History:  Diagnosis Date  . Anemia   . Anemia due to antineoplastic chemotherapy 08/15/2015  . Anxiety   . Arthritis   . Cataract   . Chronic kidney disease (CKD), stage III (moderate) 02/09/2015   Controlled  . CMML (chronic myelomonocytic leukemia) ([place])    gets Aranesp about monthly, otherwise surveillance with Dr. Marin Olp  . Combined fat and carbohydrate induced hyperlipemia 02/09/2015   Controlled  . Depression   . DLBCL (diffuse large B cell lymphoma) ([place]) 08/15/2015  . Essential (primary) hypertension 02/09/2015  . Gastro-esophageal reflux disease without esophagitis 02/09/2015   Controlled  . Tinnitus    Family History  Problem Relation Age of Onset  . Anuerysm Mother 64       Deceased  . Colon cancer Father 90       Deceased  . Colon cancer Brother   . Prostate cancer Brother        Deceased  . Alzheimer's disease Brother   . Heart disease  Brother   . Alzheimer's disease Paternal Grandmother   . Early death Maternal Grandmother        Unknown  . Obesity Son   . Obesity Son        Gastric Bypass  . Healthy Son   . Healthy Daughter   . Diabetes Neg Hx    Past Surgical History:  Procedure Laterality Date  . CATARACT EXTRACTION, BILATERAL    . COLONOSCOPY  May 2011  . CYSTOSCOPY    . TOTAL HIP ARTHROPLASTY  2003   Right   . TOTAL HIP ARTHROPLASTY  April, 2011   Left  . TRIGGER FINGER RELEASE  Nov 2013  . TURBINECTOMY  2006  . UPPER GI ENDOSCOPY  Nov 2015   Social History   Occupational History  . Not on file  Tobacco Use  . Smoking status: Former Research scientist (life sciences)  . Smokeless tobacco: Never Used  . Tobacco comment: Quit >50 years ago  Substance and Sexual Activity  . Alcohol use: Yes    Alcohol/week: 1.0 - 4.0 standard drinks    Types: 1 - 4 Shots of liquor per week    Comment: 4 glasses per week  . Drug use: No  . Sexual activity: Not on file

## 2019-07-13 ENCOUNTER — Telehealth: Payer: Self-pay | Admitting: Physical Medicine and Rehabilitation

## 2019-07-13 NOTE — Telephone Encounter (Signed)
Scheduled for 1/28 at 1000 with driver and no blood thinners.

## 2019-07-13 NOTE — Telephone Encounter (Signed)
Left L3-4 interlam, ? BT

## 2019-07-14 ENCOUNTER — Encounter: Payer: Self-pay | Admitting: Family Medicine

## 2019-07-14 MED ORDER — BETHANECHOL CHLORIDE 25 MG PO TABS: ORAL_TABLET | ORAL | 11 refills | Status: DC

## 2019-07-17 ENCOUNTER — Encounter: Payer: Self-pay | Admitting: Family

## 2019-07-17 ENCOUNTER — Inpatient Hospital Stay: Payer: Medicare Other | Attending: Hematology & Oncology

## 2019-07-17 ENCOUNTER — Inpatient Hospital Stay (HOSPITAL_BASED_OUTPATIENT_CLINIC_OR_DEPARTMENT_OTHER): Payer: Medicare Other | Admitting: Family

## 2019-07-17 ENCOUNTER — Inpatient Hospital Stay: Payer: Medicare Other

## 2019-07-17 ENCOUNTER — Other Ambulatory Visit: Payer: Self-pay

## 2019-07-17 VITALS — BP 113/71 | HR 78 | Temp 97.1°F | Resp 19 | Ht 69.0 in | Wt 209.0 lb

## 2019-07-17 DIAGNOSIS — G8929 Other chronic pain: Secondary | ICD-10-CM | POA: Diagnosis not present

## 2019-07-17 DIAGNOSIS — Z881 Allergy status to other antibiotic agents status: Secondary | ICD-10-CM | POA: Diagnosis not present

## 2019-07-17 DIAGNOSIS — D6481 Anemia due to antineoplastic chemotherapy: Secondary | ICD-10-CM

## 2019-07-17 DIAGNOSIS — M545 Low back pain: Secondary | ICD-10-CM | POA: Insufficient documentation

## 2019-07-17 DIAGNOSIS — Z8572 Personal history of non-Hodgkin lymphomas: Secondary | ICD-10-CM | POA: Insufficient documentation

## 2019-07-17 DIAGNOSIS — C8330 Diffuse large B-cell lymphoma, unspecified site: Secondary | ICD-10-CM

## 2019-07-17 DIAGNOSIS — Z79899 Other long term (current) drug therapy: Secondary | ICD-10-CM | POA: Insufficient documentation

## 2019-07-17 DIAGNOSIS — D508 Other iron deficiency anemias: Secondary | ICD-10-CM

## 2019-07-17 DIAGNOSIS — Z886 Allergy status to analgesic agent status: Secondary | ICD-10-CM | POA: Insufficient documentation

## 2019-07-17 DIAGNOSIS — C9310 Chronic myelomonocytic leukemia not having achieved remission: Secondary | ICD-10-CM

## 2019-07-17 DIAGNOSIS — G629 Polyneuropathy, unspecified: Secondary | ICD-10-CM | POA: Insufficient documentation

## 2019-07-17 DIAGNOSIS — T451X5A Adverse effect of antineoplastic and immunosuppressive drugs, initial encounter: Secondary | ICD-10-CM

## 2019-07-17 DIAGNOSIS — D696 Thrombocytopenia, unspecified: Secondary | ICD-10-CM

## 2019-07-17 DIAGNOSIS — Z888 Allergy status to other drugs, medicaments and biological substances status: Secondary | ICD-10-CM | POA: Insufficient documentation

## 2019-07-17 LAB — CBC WITH DIFFERENTIAL (CANCER CENTER ONLY)
Abs Immature Granulocytes: 0.07 10*3/uL (ref 0.00–0.07)
Basophils Absolute: 0.1 10*3/uL (ref 0.0–0.1)
Basophils Relative: 1 %
Eosinophils Absolute: 0.1 10*3/uL (ref 0.0–0.5)
Eosinophils Relative: 1 %
HCT: 30.8 % — ABNORMAL LOW (ref 39.0–52.0)
Hemoglobin: 9.9 g/dL — ABNORMAL LOW (ref 13.0–17.0)
Immature Granulocytes: 1 %
Lymphocytes Relative: 33 %
Lymphs Abs: 2.6 10*3/uL (ref 0.7–4.0)
MCH: 37.1 pg — ABNORMAL HIGH (ref 26.0–34.0)
MCHC: 32.1 g/dL (ref 30.0–36.0)
MCV: 115.4 fL — ABNORMAL HIGH (ref 80.0–100.0)
Monocytes Absolute: 2.5 10*3/uL — ABNORMAL HIGH (ref 0.1–1.0)
Monocytes Relative: 33 %
Neutro Abs: 2.4 10*3/uL (ref 1.7–7.7)
Neutrophils Relative %: 31 %
Platelet Count: 90 10*3/uL — ABNORMAL LOW (ref 150–400)
RBC: 2.67 MIL/uL — ABNORMAL LOW (ref 4.22–5.81)
RDW: 15.5 % (ref 11.5–15.5)
WBC Count: 7.7 10*3/uL (ref 4.0–10.5)
nRBC: 0 % (ref 0.0–0.2)

## 2019-07-17 LAB — CMP (CANCER CENTER ONLY)
ALT: 10 U/L (ref 0–44)
AST: 12 U/L — ABNORMAL LOW (ref 15–41)
Albumin: 4.2 g/dL (ref 3.5–5.0)
Alkaline Phosphatase: 58 U/L (ref 38–126)
Anion gap: 7 (ref 5–15)
BUN: 31 mg/dL — ABNORMAL HIGH (ref 8–23)
CO2: 31 mmol/L (ref 22–32)
Calcium: 9.6 mg/dL (ref 8.9–10.3)
Chloride: 104 mmol/L (ref 98–111)
Creatinine: 1.73 mg/dL — ABNORMAL HIGH (ref 0.61–1.24)
GFR, Est AFR Am: 40 mL/min — ABNORMAL LOW (ref >60)
GFR, Estimated: 34 mL/min — ABNORMAL LOW (ref >60)
Glucose, Bld: 116 mg/dL — ABNORMAL HIGH (ref 70–99)
Potassium: 4.3 mmol/L (ref 3.5–5.1)
Sodium: 142 mmol/L (ref 135–145)
Total Bilirubin: 0.8 mg/dL (ref 0.3–1.2)
Total Protein: 7.6 g/dL (ref 6.5–8.1)

## 2019-07-17 LAB — RETICULOCYTES
Immature Retic Fract: 16 % — ABNORMAL HIGH (ref 2.3–15.9)
RBC.: 2.67 MIL/uL — ABNORMAL LOW (ref 4.22–5.81)
Retic Count, Absolute: 32.6 10*3/uL (ref 19.0–186.0)
Retic Ct Pct: 1.2 % (ref 0.4–3.1)

## 2019-07-17 MED ORDER — DARBEPOETIN ALFA 500 MCG/ML IJ SOSY
PREFILLED_SYRINGE | INTRAMUSCULAR | Status: AC
  Filled 2019-07-17: qty 1

## 2019-07-17 MED ORDER — DARBEPOETIN ALFA 500 MCG/ML IJ SOSY
500.0000 ug | PREFILLED_SYRINGE | Freq: Once | INTRAMUSCULAR | Status: AC
  Administered 2019-07-17: 12:00:00 500 ug via SUBCUTANEOUS

## 2019-07-17 MED ORDER — DARBEPOETIN ALFA 300 MCG/0.6ML IJ SOSY
PREFILLED_SYRINGE | INTRAMUSCULAR | Status: AC
  Filled 2019-07-17: qty 0.6

## 2019-07-17 NOTE — Patient Instructions (Signed)
Darbepoetin Alfa injection What is this medicine? DARBEPOETIN ALFA (dar be POE e tin AL fa) helps your body make more red blood cells. It is used to treat anemia caused by chronic kidney failure and chemotherapy. This medicine may be used for other purposes; ask your health care provider or pharmacist if you have questions. COMMON BRAND NAME(S): Aranesp What should I tell my health care provider before I take this medicine? They need to know if you have any of these conditions:  blood clotting disorders or history of blood clots  cancer patient not on chemotherapy  cystic fibrosis  heart disease, such as angina, heart failure, or a history of a heart attack  hemoglobin level of 12 g/dL or greater  high blood pressure  low levels of folate, iron, or vitamin B12  seizures  an unusual or allergic reaction to darbepoetin, erythropoietin, albumin, hamster proteins, latex, other medicines, foods, dyes, or preservatives  pregnant or trying to get pregnant  breast-feeding How should I use this medicine? This medicine is for injection into a vein or under the skin. It is usually given by a health care professional in a hospital or clinic setting. If you get this medicine at home, you will be taught how to prepare and give this medicine. Use exactly as directed. Take your medicine at regular intervals. Do not take your medicine more often than directed. It is important that you put your used needles and syringes in a special sharps container. Do not put them in a trash can. If you do not have a sharps container, call your pharmacist or healthcare provider to get one. A special MedGuide will be given to you by the pharmacist with each prescription and refill. Be sure to read this information carefully each time. Talk to your pediatrician regarding the use of this medicine in children. While this medicine may be used in children as young as 1 month of age for selected conditions, precautions do  apply. Overdosage: If you think you have taken too much of this medicine contact a poison control center or emergency room at once. NOTE: This medicine is only for you. Do not share this medicine with others. What if I miss a dose? If you miss a dose, take it as soon as you can. If it is almost time for your next dose, take only that dose. Do not take double or extra doses. What may interact with this medicine? Do not take this medicine with any of the following medications:  epoetin alfa This list may not describe all possible interactions. Give your health care provider a list of all the medicines, herbs, non-prescription drugs, or dietary supplements you use. Also tell them if you smoke, drink alcohol, or use illegal drugs. Some items may interact with your medicine. What should I watch for while using this medicine? Your condition will be monitored carefully while you are receiving this medicine. You may need blood work done while you are taking this medicine. This medicine may cause a decrease in vitamin B6. You should make sure that you get enough vitamin B6 while you are taking this medicine. Discuss the foods you eat and the vitamins you take with your health care professional. What side effects may I notice from receiving this medicine? Side effects that you should report to your doctor or health care professional as soon as possible:  allergic reactions like skin rash, itching or hives, swelling of the face, lips, or tongue  breathing problems  changes in   vision  chest pain  confusion, trouble speaking or understanding  feeling faint or lightheaded, falls  high blood pressure  muscle aches or pains  pain, swelling, warmth in the leg  rapid weight gain  severe headaches  sudden numbness or weakness of the face, arm or leg  trouble walking, dizziness, loss of balance or coordination  seizures (convulsions)  swelling of the ankles, feet, hands  unusually weak or  tired Side effects that usually do not require medical attention (report to your doctor or health care professional if they continue or are bothersome):  diarrhea  fever, chills (flu-like symptoms)  headaches  nausea, vomiting  redness, stinging, or swelling at site where injected This list may not describe all possible side effects. Call your doctor for medical advice about side effects. You may report side effects to FDA at 1-800-FDA-1088. Where should I keep my medicine? Keep out of the reach of children. Store in a refrigerator between 2 and 8 degrees C (36 and 46 degrees F). Do not freeze. Do not shake. Throw away any unused portion if using a single-dose vial. Throw away any unused medicine after the expiration date. NOTE: This sheet is a summary. It may not cover all possible information. If you have questions about this medicine, talk to your doctor, pharmacist, or health care provider.  2020 Elsevier/Gold Standard (2017-06-26 16:44:20)  

## 2019-07-17 NOTE — Progress Notes (Signed)
Hematology and Oncology Follow Up Visit  Jeremiah Walker [id number] [birth year] 86 y.o. 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 Hgb is 9.9.  He has chronic back pain and follows up with Dr. [name] regularly. He has been exercising at home and doing workouts that he learned in PT. He currently has muscle spasms and pain in the left lower back.  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  No swelling in his extremities. No falls or syncope.  No fever, chills, n/v, cough, rash, dizziness, SOB, chest pain, palpitations, abdominal pain or changes in bowel or bladder habits.  No episodes of bleeding. No bruising or petechiae.  He is eating well and staying hydrated. His weight is stable.   ECOG Performance Status: 1 - Symptomatic but completely ambulatory  Medications:  Allergies as of 07/17/2019      Reactions   Nsaids Other (See Comments), Tinitus   Bruising   Statins Nausea And Vomiting, Other (See Comments)   Other reaction(s): Kidney issues Muscle pain   Tolmetin Other (See Comments)   Bruising   Ciprofloxacin Other (See Comments)   Other reaction(s):Achilles tendon   Ropinirole Anxiety   Mood swing   Nitroglycerin    Requip [ropinirole Hcl] Anxiety      Medication List       Accurate as of July 17, 2019 11:24 AM. If you have any questions, ask your nurse or doctor.        allopurinol 100 MG tablet Commonly known as: ZYLOPRIM TAKE 2 TABLETS(200 MG) BY MOUTH DAILY   ALPRAZolam 0.5 MG tablet Commonly known as: XANAX TAKE 1 TABLET(0.5 MG) BY MOUTH THREE TIMES DAILY AS NEEDED FOR ANXIETY What changed:   how much to take  how to take this  when to take this  reasons to take this  additional  instructions   amoxicillin 500 MG capsule Commonly known as: AMOXIL Take 4 caps (2gm) by mouth once prior to dental procedure   azelastine 0.1 % nasal spray Commonly known as: ASTELIN Place 2 sprays into both nostrils 2 (two) times daily. Use in each nostril as directed   b complex vitamins tablet Take 1 tablet by mouth daily.   bethanechol 25 MG tablet Commonly known as: URECHOLINE TAKE 1 TABLET(25 MG) BY MOUTH THREE TIMES DAILY   Calcium 500 MG tablet Take 600 mg by mouth daily.   folic acid 1 MG tablet Commonly known as: FOLVITE TAKE 2 TABLETS(2 MG) BY MOUTH DAILY What changed: See the new instructions.   HYDROcodone-acetaminophen 7.5-325 MG tablet Commonly known as: NORCO Take 1 tablet by mouth every 6 (six) hours as needed for moderate pain.   Magnesium Oxide 200 MG Tabs Take 200 mg by mouth daily.   Melatonin 10 MG Tabs Take 20 mg by mouth. Take 20 mg daily as needed at bedtime.   multivitamin tablet Take 1 tablet by mouth daily.   OMEGA-3 FISH OIL PO Take 2 capsules by mouth daily. EPA/DHA 520/350   polyethylene glycol 17 g packet Commonly known as: MIRALAX / GLYCOLAX Take 17 g by mouth daily as needed for mild constipation.   predniSONE 20 MG tablet Commonly known as: DELTASONE May take 1 tablet daily for up to 3 days as needed  for gout attack or muscle spasm   pregabalin 25 MG capsule Commonly known as: Lyrica Take 1 capsule (25 mg total) by mouth 3 (three) times daily.   UNABLE TO FIND Take 1 tablet by mouth daily. Med Name: B-6 VITAMIN   vitamin C 500 MG tablet Commonly known as: ASCORBIC ACID Take 500 mg by mouth daily.   Vitamin D3 50 MCG (2000 UT) capsule Take 2,000 Units by mouth daily.   zinc gluconate 50 MG tablet Take 50 mg by mouth daily.       Allergies:  Allergies  Allergen Reactions  . Nsaids Other (See Comments) and Tinitus    Bruising   . Statins Nausea And Vomiting and Other (See Comments)    Other reaction(s):  Kidney issues Muscle pain   . Tolmetin Other (See Comments)    Bruising  . Ciprofloxacin Other (See Comments)    Other reaction(s):Achilles tendon   . Ropinirole Anxiety    Mood swing  . Nitroglycerin   . Requip [Ropinirole Hcl] Anxiety    Past Medical History, Surgical history, Social history, and Family History were reviewed and updated.  Review of Systems: All other 10 point review of systems is negative.   Physical Exam:  vitals were not taken for this visit.   Wt Readings from Last 3 Encounters:  06/29/19 202 lb (91.6 kg)  06/17/19 209 lb (94.8 kg)  05/28/19 202 lb (91.6 kg)    Ocular: Sclerae unicteric, pupils equal, round and reactive to light Ear-nose-throat: Oropharynx clear, dentition fair Lymphatic: No cervical or supraclavicular adenopathy Lungs no rales or rhonchi, good excursion bilaterally Heart regular rate and rhythm, no murmur appreciated Abd soft, nontender, positive bowel sounds, no liver or spleen tip palpated on exam, no fluid wave  MSK no focal spinal tenderness, no joint edema Neuro: non-focal, well-oriented, appropriate affect Breasts: Deferred   Lab Results  Component Value Date   WBC 6.1 06/17/2019   HGB 9.7 (L) 06/17/2019   HCT 29.8 (L) 06/17/2019   MCV 113.7 (H) 06/17/2019   PLT 87 (L) 06/17/2019   Lab Results  Component Value Date   FERRITIN 383 (H) 06/17/2019   IRON 92 06/17/2019   TIBC 234 06/17/2019   UIBC 142 06/17/2019   IRONPCTSAT 39 06/17/2019   Lab Results  Component Value Date   RETICCTPCT 1.0 06/17/2019   RBC 2.63 (L) 06/17/2019   RBC 2.62 (L) 06/17/2019   No results found for: KPAFRELGTCHN, LAMBDASER, KAPLAMBRATIO No results found for: IGGSERUM, IGA, IGMSERUM No results found for: Odetta Pink, SPEI   Chemistry      Component Value Date/Time   NA 140 06/17/2019 1116   NA 139 05/08/2017 1123   NA 141 05/15/2016 1126   K 4.0 06/17/2019 1116   K 4.0  05/08/2017 1123   K 4.3 05/15/2016 1126   CL 103 06/17/2019 1116   CL 105 05/08/2017 1123   CO2 29 06/17/2019 1116   CO2 30 05/08/2017 1123   CO2 26 05/15/2016 1126   BUN 26 (H) 06/17/2019 1116   BUN 15 05/08/2017 1123   BUN 29.1 (H) 05/15/2016 1126   CREATININE 1.52 (H) 06/17/2019 1116   CREATININE 1.2 05/08/2017 1123   CREATININE 1.7 (H) 05/15/2016 1126   GLU 90 11/15/2014 0000      Component Value Date/Time   CALCIUM 9.1 06/17/2019 1116   CALCIUM 9.1 05/08/2017 1123   CALCIUM 9.7 05/15/2016 1126   ALKPHOS 59 06/17/2019 1116  ALKPHOS 64 05/08/2017 1123   ALKPHOS 69 05/15/2016 1126   AST 14 (L) 06/17/2019 1116   AST 23 05/15/2016 1126   ALT 10 06/17/2019 1116   ALT 19 05/08/2017 1123   ALT 22 05/15/2016 1126   BILITOT 0.9 06/17/2019 1116   BILITOT 0.84 05/15/2016 1126       Impression and Plan: Jeremiah Walker a 86 yo caucasian gentleman with history of diffuse large cell non-Hodgkin's lymphoma treated with 6 cycles of R-CHOP completed in April 2014.  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  He received Aranesp today.  We will plan to see him back in another month.  He will contact our office with nay questions or concerns. We can certainly see him sooner if needed.   Laverna Peace, NP 1/22/202111:24 AM

## 2019-07-20 ENCOUNTER — Telehealth: Payer: Self-pay | Admitting: Family

## 2019-07-20 ENCOUNTER — Encounter: Payer: Self-pay | Admitting: Family Medicine

## 2019-07-20 DIAGNOSIS — G2581 Restless legs syndrome: Secondary | ICD-10-CM

## 2019-07-20 DIAGNOSIS — G4700 Insomnia, unspecified: Secondary | ICD-10-CM

## 2019-07-20 LAB — IRON AND TIBC
Iron: 144 ug/dL (ref 42–163)
Saturation Ratios: 57 % — ABNORMAL HIGH (ref 20–55)
TIBC: 253 ug/dL (ref 202–409)
UIBC: 109 ug/dL — ABNORMAL LOW (ref 117–376)

## 2019-07-20 LAB — FERRITIN: Ferritin: 401 ng/mL — ABNORMAL HIGH (ref 24–336)

## 2019-07-20 LAB — LACTATE DEHYDROGENASE: LDH: 199 U/L — ABNORMAL HIGH (ref 98–192)

## 2019-07-20 NOTE — Telephone Encounter (Signed)
Appointments scheduled calendar/letter mailed per 1/22 los

## 2019-07-21 DIAGNOSIS — Z23 Encounter for immunization: Secondary | ICD-10-CM | POA: Diagnosis not present

## 2019-07-21 MED ORDER — ALPRAZOLAM 0.5 MG PO TABS: ORAL_TABLET | ORAL | 3 refills | Status: DC

## 2019-07-23 ENCOUNTER — Ambulatory Visit (INDEPENDENT_AMBULATORY_CARE_PROVIDER_SITE_OTHER): Payer: Medicare Other | Admitting: Physical Medicine and Rehabilitation

## 2019-07-23 ENCOUNTER — Encounter: Payer: Self-pay | Admitting: Physical Medicine and Rehabilitation

## 2019-07-23 ENCOUNTER — Ambulatory Visit: Payer: Self-pay

## 2019-07-23 ENCOUNTER — Other Ambulatory Visit: Payer: Self-pay

## 2019-07-23 VITALS — BP 138/69 | HR 93

## 2019-07-23 DIAGNOSIS — M48062 Spinal stenosis, lumbar region with neurogenic claudication: Secondary | ICD-10-CM

## 2019-07-23 DIAGNOSIS — M5416 Radiculopathy, lumbar region: Secondary | ICD-10-CM

## 2019-07-23 DIAGNOSIS — M48061 Spinal stenosis, lumbar region without neurogenic claudication: Secondary | ICD-10-CM

## 2019-07-23 MED ORDER — METHYLPREDNISOLONE ACETATE 80 MG/ML IJ SUSP
40.0000 mg | Freq: Once | INTRAMUSCULAR | Status: AC
  Administered 2019-07-23: 40 mg

## 2019-07-23 NOTE — Progress Notes (Signed)
 .  Numeric Pain Rating Scale and Functional Assessment Average Pain 8   In the last MONTH (on 0-10 scale) has pain interfered with the following?  1. General activity like being  able to carry out your everyday physical activities such as walking, climbing stairs, carrying groceries, or moving a chair?  Rating(8)   +Driver, -BT, -Dye Allergies.  

## 2019-07-28 ENCOUNTER — Other Ambulatory Visit: Payer: Self-pay | Admitting: Hematology & Oncology

## 2019-07-29 ENCOUNTER — Telehealth: Payer: Self-pay | Admitting: Physical Medicine and Rehabilitation

## 2019-07-29 NOTE — Telephone Encounter (Signed)
I hope he meant 5mg ? Do we have OV for him? I think PT for dry needling may help but we can talk about alternatives at [facility]

## 2019-07-30 NOTE — Telephone Encounter (Signed)
Scheduled for OV. 

## 2019-07-31 ENCOUNTER — Other Ambulatory Visit: Payer: Self-pay | Admitting: Family Medicine

## 2019-07-31 DIAGNOSIS — G894 Chronic pain syndrome: Secondary | ICD-10-CM

## 2019-07-31 MED ORDER — HYDROCODONE-ACETAMINOPHEN 7.5-325 MG PO TABS: 1.0000 | ORAL_TABLET | Freq: Four times a day (QID) | ORAL | 0 refills | Status: DC | PRN

## 2019-08-04 ENCOUNTER — Other Ambulatory Visit: Payer: Self-pay | Admitting: Physical Medicine and Rehabilitation

## 2019-08-04 ENCOUNTER — Telehealth: Payer: Self-pay | Admitting: Physical Medicine and Rehabilitation

## 2019-08-04 DIAGNOSIS — M48062 Spinal stenosis, lumbar region with neurogenic claudication: Secondary | ICD-10-CM

## 2019-08-04 DIAGNOSIS — M545 Low back pain, unspecified: Secondary | ICD-10-CM

## 2019-08-04 DIAGNOSIS — G8929 Other chronic pain: Secondary | ICD-10-CM

## 2019-08-04 MED ORDER — BACLOFEN 10 MG PO TABS: ORAL_TABLET | ORAL | 0 refills | Status: DC

## 2019-08-04 NOTE — Telephone Encounter (Signed)
I don't have anything stonger than 15 mg of hydrocodone. I am happy to try muscle relaxer and maybe another medication that might be additive, he would be wise to re-group with PT for dry needling of the left lowe back muscles - PT here or maybe [place] since [place] there. There is a [facility] near the [place] area.  He should use ICE as well. If he feels bad enough then can F/up With Joni Fears or if really needed then ED. Lastly if that severe may need new MRI LSPINE - in fact that would be wise to get started no matter.

## 2019-08-04 NOTE — Progress Notes (Signed)
Jeremiah Walker - 86 y.o. male MRN [id number]  Date of birth: 07/16/1927  Office Visit Note: Visit Date: 07/23/2019 PCP: [name], MD Referred by: [name], MD  Subjective: Chief Complaint  Patient presents with  . Lower Back - Pain  . Left Leg - Pain   HPI: Jeremiah Walker is a 86 y.o. male who comes in today For planned left L2-3 interlaminar epidural steroid injection.  Patient has chronic worsening severe left lower back pain really above the iliac crest now with some referral pain into the hip and final thigh.  Prior trigger point injection was greatly beneficial in the past with the last one we performed really did not help that much.  He is actually been taking some pain medication from his primary care physician.  He takes this in the morning.  He has been unable to work out and do as much as he would like to because of coronavirus.  He is getting his vaccination coming up and will be allowed to be in the pool which will hopefully benefit him from an exercise standpoint.  Prior MRI from 2017 shows L2-3 foraminal narrowing and disc osteophyte potentially affecting the L to nerve root on the left which could give him a referral pattern in the upper thigh.  He does not have any high-grade central stenosis.  He does have some scoliosis.  Depending on relief with the interlaminar approach would look at a transforaminal approach versus updating MRI of the lumbar spine.  Also could regroup with physical therapy dry needling.  He will continue current exercise plan.  ROS Otherwise per HPI.  Assessment & Plan: Visit Diagnoses:  1. Spinal stenosis of lumbar region with neurogenic claudication   2. Lumbar radiculopathy   3. Foraminal stenosis of lumbar region     Plan: No additional findings.   Meds & Orders:  Meds ordered this encounter  Medications  . methylPREDNISolone acetate (DEPO-MEDROL) injection 40 mg    Orders Placed This Encounter  Procedures  . XR C-ARM  NO REPORT  . Epidural Steroid injection    Follow-up: Return if symptoms worsen or fail to improve, for Consider updating MRI L-spine and/or physical therapy dry needling.   Procedures: No procedures performed  Lumbar Epidural Steroid Injection - Interlaminar Approach with Fluoroscopic Guidance  Patient: Jeremiah Walker      Date of Birth: 1928-02-20 MRN: [id number] PCP: [name], MD      Visit Date: 07/23/2019   Universal Protocol:     Consent Given By: the patient  Position: PRONE  Additional Comments: Vital signs were monitored before and after the procedure. Patient was prepped and draped in the usual sterile fashion. The correct patient, procedure, and site was verified.   Injection Procedure Details:  Procedure Site One Meds Administered:  Meds ordered this encounter  Medications  . methylPREDNISolone acetate (DEPO-MEDROL) injection 40 mg     Laterality: Left  Location/Site:  L2-L3  Needle size: 20 G  Needle type: Tuohy  Needle Placement: Paramedian epidural  Findings:   -Comments: Excellent flow of contrast into the epidural space.  Do to patient's body habitus and stiff spine had difficult attempt at epidural placement at L3-4 and moved to L2-3 which was more appropriate anyway.  Nonetheless still had some difficulty there but did get in the epidural space.  In the future would look at transforaminal approach potentially.  Fluoroscopic imaging does show pretty compressed area around the L2 foramen.  Procedure Details: Using a paramedian approach from the side mentioned above, the region overlying the inferior lamina was localized under fluoroscopic visualization and the soft tissues overlying this structure were infiltrated with 4 ml. of 1% Lidocaine without Epinephrine. The Tuohy needle was inserted into the epidural space using a paramedian approach.   The epidural space was localized using loss of resistance along with lateral and bi-planar  fluoroscopic views.  After negative aspirate for air, blood, and CSF, a 2 ml. volume of Isovue-250 was injected into the epidural space and the flow of contrast was observed. Radiographs were obtained for documentation purposes.    The injectate was administered into the level noted above.   Additional Comments:  The patient tolerated the procedure well Dressing: 2 x 2 sterile gauze and Band-Aid    Post-procedure details: Patient was observed during the procedure. Post-procedure instructions were reviewed.  Patient left the clinic in stable condition.    Clinical History: Lumbar spine MRI dated 05/29/2016   L4-5: Anterolisthesis, uncovered disc with small bulge, central protrusion, moderate facet and ligamentum flavum hypertrophy, and large right foraminal and extra foraminal marginal osteophyte. Severe right foraminal narrowing. Mild left foraminal narrowing. Effacement of lateral recesses with contact upon the descending right L5 nerve root. No significant canal stenosis.   L5-S1: Small disc bulge and mild facet/ligamentum flavum hypertrophy. Mild bilateral foraminal narrowing. Right-sided 5 mm T2 hyperintense structure arising from the right facet joint possibly impinging the exiting right L5 nerve root likely a synovial cyst  Mild S-shaped lumbar curvature, grade 1 L4-5 anterolisthesis, and prominent discogenic and facet arthropathy. No acute osseous abnormality.  No high-grade canal stenosis. 3. Multiple levels of foraminal narrowing moderate at left L2-3, moderate at right L3-4, and severe at right L4-5 levels. 4. Large marginal osteophytes with contact upon exiting left L2 nerve root at the L2-3 level and right L3 nerve root at the L3-4 level.    He reports that he has quit smoking. He has never used smokeless tobacco.  Recent Labs    12/29/18 1605  LABURIC 6.1    Objective:  VS:  HT:    WT:   BMI:     BP:138/69  HR:93bpm  TEMP: ( )  RESP:  Physical  Exam  Ortho Exam Imaging: No results found.  Past Medical/Family/Surgical/Social History: Medications & Allergies reviewed per EMR, new medications updated. Patient Active Problem List   Diagnosis Date Noted  . Chest pain 03/26/2019  . Chronic myelomonocytic leukemia not having achieved remission ([place]) 12/04/2016  . Chronic pain syndrome 09/26/2015  . De Quervain's tenosynovitis, right 09/13/2015  . Anemia due to antineoplastic chemotherapy 08/15/2015  . DLBCL (diffuse large B cell lymphoma) ([place]) 08/15/2015  . Skin lesion of face 05/21/2015  . Peripheral neuropathy due to chemotherapy ([place]) 02/09/2015  . Benign prostatic hyperplasia with urinary obstruction 02/09/2015  . Bilateral hearing loss 02/09/2015  . Low back pain 02/09/2015  . Chronic kidney disease (CKD), stage III (moderate) 02/09/2015  . Essential (primary) hypertension 02/09/2015  . Gastro-esophageal reflux disease without esophagitis 02/09/2015  . Anxiety, generalized 02/09/2015  . Cannot sleep 02/09/2015  . Combined fat and carbohydrate induced hyperlipemia 02/09/2015   Past Medical History:  Diagnosis Date  . Anemia   . Anemia due to antineoplastic chemotherapy 08/15/2015  . Anxiety   . Arthritis   . Cataract   . Chronic kidney disease (CKD), stage III (moderate) 02/09/2015   Controlled  . CMML (chronic myelomonocytic leukemia) ([place])    gets Aranesp  about monthly, otherwise surveillance with Dr. Marin Olp  . Combined fat and carbohydrate induced hyperlipemia 02/09/2015   Controlled  . Depression   . DLBCL (diffuse large B cell lymphoma) ([place]) 08/15/2015  . Essential (primary) hypertension 02/09/2015  . Gastro-esophageal reflux disease without esophagitis 02/09/2015   Controlled  . Tinnitus    Family History  Problem Relation Age of Onset  . Anuerysm Mother 48       Deceased  . Colon cancer Father 25       Deceased  . Colon cancer Brother   . Prostate cancer Brother        Deceased  . Alzheimer's disease  Brother   . Heart disease Brother   . Alzheimer's disease Paternal Grandmother   . Early death Maternal Grandmother        Unknown  . Obesity Son   . Obesity Son        Gastric Bypass  . Healthy Son   . Healthy Daughter   . Diabetes Neg Hx    Past Surgical History:  Procedure Laterality Date  . CATARACT EXTRACTION, BILATERAL    . COLONOSCOPY  May 2011  . CYSTOSCOPY    . TOTAL HIP ARTHROPLASTY  2003   Right   . TOTAL HIP ARTHROPLASTY  April, 2011   Left  . TRIGGER FINGER RELEASE  Nov 2013  . TURBINECTOMY  2006  . UPPER GI ENDOSCOPY  Nov 2015   Social History   Occupational History  . Not on file  Tobacco Use  . Smoking status: Former Research scientist (life sciences)  . Smokeless tobacco: Never Used  . Tobacco comment: Quit >50 years ago  Substance and Sexual Activity  . Alcohol use: Yes    Alcohol/week: 1.0 - 4.0 standard drinks    Types: 1 - 4 Shots of liquor per week    Comment: 4 glasses per week  . Drug use: No  . Sexual activity: Not on file

## 2019-08-04 NOTE — Telephone Encounter (Signed)
Patient would like to try a muscle relaxer. Pharmacy is correct. MRI ordered.

## 2019-08-04 NOTE — Procedures (Signed)
Lumbar Epidural Steroid Injection - Interlaminar Approach with Fluoroscopic Guidance  Patient: Jeremiah Walker      Date of Birth: Apr 05, 1928 MRN: [id number] PCP: [name], MD      Visit Date: 07/23/2019   Universal Protocol:     Consent Given By: the patient  Position: PRONE  Additional Comments: Vital signs were monitored before and after the procedure. Patient was prepped and draped in the usual sterile fashion. The correct patient, procedure, and site was verified.   Injection Procedure Details:  Procedure Site One Meds Administered:  Meds ordered this encounter  Medications  . methylPREDNISolone acetate (DEPO-MEDROL) injection 40 mg     Laterality: Left  Location/Site:  L2-L3  Needle size: 20 G  Needle type: Tuohy  Needle Placement: Paramedian epidural  Findings:   -Comments: Excellent flow of contrast into the epidural space.  Do to patient's body habitus and stiff spine had difficult attempt at epidural placement at L3-4 and moved to L2-3 which was more appropriate anyway.  Nonetheless still had some difficulty there but did get in the epidural space.  In the future would look at transforaminal approach potentially.  Fluoroscopic imaging does show pretty compressed area around the L2 foramen.  Procedure Details: Using a paramedian approach from the side mentioned above, the region overlying the inferior lamina was localized under fluoroscopic visualization and the soft tissues overlying this structure were infiltrated with 4 ml. of 1% Lidocaine without Epinephrine. The Tuohy needle was inserted into the epidural space using a paramedian approach.   The epidural space was localized using loss of resistance along with lateral and bi-planar fluoroscopic views.  After negative aspirate for air, blood, and CSF, a 2 ml. volume of Isovue-250 was injected into the epidural space and the flow of contrast was observed. Radiographs were obtained for documentation  purposes.    The injectate was administered into the level noted above.   Additional Comments:  The patient tolerated the procedure well Dressing: 2 x 2 sterile gauze and Band-Aid    Post-procedure details: Patient was observed during the procedure. Post-procedure instructions were reviewed.  Patient left the clinic in stable condition.

## 2019-08-04 NOTE — Telephone Encounter (Signed)
Baclofen was sent him.  I do need to figure out if he is still taking the Lyrica.  Looks like from the controlled substance list he is getting that filled.

## 2019-08-04 NOTE — Progress Notes (Signed)
Order placed for baclofen muscle relaxer.

## 2019-08-06 ENCOUNTER — Other Ambulatory Visit: Payer: Self-pay

## 2019-08-06 DIAGNOSIS — I350 Nonrheumatic aortic (valve) stenosis: Secondary | ICD-10-CM

## 2019-08-06 MED ORDER — AMOXICILLIN 500 MG PO CAPS: ORAL_CAPSULE | ORAL | 2 refills | Status: DC

## 2019-08-10 ENCOUNTER — Encounter: Payer: Self-pay | Admitting: Family Medicine

## 2019-08-10 DIAGNOSIS — G6289 Other specified polyneuropathies: Secondary | ICD-10-CM

## 2019-08-10 MED ORDER — PREGABALIN 25 MG PO CAPS: 25.0000 mg | ORAL_CAPSULE | Freq: Three times a day (TID) | ORAL | 11 refills | Status: DC

## 2019-08-11 ENCOUNTER — Encounter: Payer: Self-pay | Admitting: Physical Medicine and Rehabilitation

## 2019-08-11 ENCOUNTER — Ambulatory Visit (INDEPENDENT_AMBULATORY_CARE_PROVIDER_SITE_OTHER): Payer: Medicare Other | Admitting: Physical Medicine and Rehabilitation

## 2019-08-11 ENCOUNTER — Other Ambulatory Visit: Payer: Self-pay

## 2019-08-11 VITALS — BP 120/67 | HR 94

## 2019-08-11 DIAGNOSIS — M48062 Spinal stenosis, lumbar region with neurogenic claudication: Secondary | ICD-10-CM

## 2019-08-11 DIAGNOSIS — G894 Chronic pain syndrome: Secondary | ICD-10-CM | POA: Diagnosis not present

## 2019-08-11 DIAGNOSIS — M7918 Myalgia, other site: Secondary | ICD-10-CM

## 2019-08-11 DIAGNOSIS — M47816 Spondylosis without myelopathy or radiculopathy, lumbar region: Secondary | ICD-10-CM | POA: Diagnosis not present

## 2019-08-11 NOTE — Progress Notes (Signed)
Numeric Pain Rating Scale and Functional Assessment Average Pain (7) Pain Right Now (7) My pain is in left side of lower back with pain in the front of left thigh Pain is worse with: lifting left leg Pain improves with: sitting and laying down   In the last MONTH (on 0-10 scale) has pain interfered with the following?  1. General activity like being  able to carry out your everyday physical activities such as walking, climbing stairs, carrying groceries, or moving a chair?  Rating(9)  2. Relation with others like being able to carry out your usual social activities and roles such as  activities at home, at work and in your community. Rating(9)  3. Enjoyment of life such that you have  been bothered by emotional problems such as feeling anxious, depressed or irritable?  Rating(9)

## 2019-08-12 ENCOUNTER — Encounter: Payer: Self-pay | Admitting: Physical Medicine and Rehabilitation

## 2019-08-12 DIAGNOSIS — M48062 Spinal stenosis, lumbar region with neurogenic claudication: Secondary | ICD-10-CM | POA: Insufficient documentation

## 2019-08-12 DIAGNOSIS — M47816 Spondylosis without myelopathy or radiculopathy, lumbar region: Secondary | ICD-10-CM | POA: Insufficient documentation

## 2019-08-12 DIAGNOSIS — M7918 Myalgia, other site: Secondary | ICD-10-CM | POA: Insufficient documentation

## 2019-08-12 NOTE — Progress Notes (Signed)
Jeremiah Walker - 86 y.o. male MRN [id number]  Date of birth: 24-Jun-1928  Office Visit Note: Visit Date: 08/11/2019 PCP: [name], MD Referred by: [name], MD  Subjective: Chief Complaint  Patient presents with  . Lower Back - Follow-up  . Left Hip - Pain   HPI: Jeremiah Walker is a 86 y.o. male who comes in today For reevaluation management of severe left-sided low back pain now with some referral into the lateral anterior lateral thigh.  His history is well-known to me and can be reviewed in the chart with prior notes.  The last time we saw him was January 28 and we completed interlaminar epidural steroid injection at L2-3.  Patient has some level of degenerative changes here with stenosis and foraminal narrowing on the left.  The injection was somewhat difficult from a anatomic standpoint although not very difficult for him.  Interestingly he was having severe pain up until just a few days ago and then he said yesterday miraculously he had actually no pain during the day which was rare for him.  We are 2 weeks or so from the injection so it is possible that that did help.  He is having pain again today but not as bad.  He reports his pain is worse with standing and ambulating but also mechanically worse with moving the left leg in a flexion and abduction type of maneuver on the left.  He has no groin pain.  He does have prior left total hip arthroplasty in 2011 I believe.  He actually has prior right total hip arthroplasty as well.  He is still not exercising he is not getting in the pool like he used to even though they are allowed to do that now his facility.  He seems somewhat just out of sorts lately with the amount of pain that he has been having.  At least with me every now and he has some memory difficulties of what we have done in the past.  By way of review we completed facet joint block when I first saw him at L4-5 where he does have arthritis and listhesis.  He  reports that that seemed to help quite a bit but my notes show that it was somewhat beneficial.  The biggest relief he got from an injection standpoint was trigger point injection over the iliac crest and quadratus lumborum area.  We have not been able to repeat that injection as well.  He has not had recent physical therapy.  They do provide physical therapy at his place where he lives but I think this is in home now which is okay.  They do not do dry needling there.  Because of the severity of pain we have already put on order a lumbar spine MRI but this has not been ordered.  These have been delayed recently.  He continues to take hydrocodone.  For years I guess he was on significant amount of opioid medications for his back and hip pain.  He has really weaned himself down to taking no more than 3 hydrocodone 7.5 mg tablets per day.  These are provided by his primary care physician Dr. Janett Billow Copland.  He tells me he takes 2 in the morning sometimes and then 9 later or occasionally will take 1 in the morning and then 1 or 2 later.  He will take Aleve on occasion but he is not supposed to he says.  He is on Lyrica 25  mg 3 times daily for peripheral polyneuropathy.  I did talk to him about the fact that that likely would help with nerve pain if this is coming from the spine is more nerve related.  He has had no red flag complaints of focal weakness although he feels weak in general.  He has not had any recent falls.  Review of Systems  Musculoskeletal: Positive for back pain and joint pain.  All other systems reviewed and are negative.  Otherwise per HPI.  Assessment & Plan: Visit Diagnoses:  1. Spondylosis without myelopathy or radiculopathy, lumbar region   2. Spinal stenosis of lumbar region with neurogenic claudication   3. Myofascial pain syndrome   4. Chronic pain syndrome     Plan: Findings:  Chronic worsening severe at times left lower back pain some referral in the left hip with some  mechanical complaints of pain upon arising from a seated position flexion at the hip as well as abduction.  Exam does not show any pain with rotation of the hip.  There is no pain over the greater trochanters.  I still think most of this is related to his lumbar spine as we await updated MRI.  He could be having pain from the facet joint is have seen that hurt with lifting of the leg at times.  I have also seen on occasion where an L2 nerve root problem will manifest in pain like that with inability to lift the leg easily but usually not so much with pain.  He clearly does have disc osteophyte problem at L2 on the left.  At this point since he is doing a little bit better we are going to wait on the MRI before we do anything major.  I do want to get him back in for facet joint block because he is adamant that that did seem to help his lower back.  I also want him to talk to the physical therapist at his residence.  I want him to look at hip mobility and back modalities and just see if they can get him some relief.  Prescription was written for that and handed to him.  I want him to continue with his current medication plan but I do think he should try 2 tablets of Aleve in the morning at least on 1 or 2 occasions.  We warned him quite a bit and he understands not to take that all the time and just see if that helps enough that he is moving around better.  This would just be more of a test.  He does want to continue to not increase his opioid use.  His case is complicated by generalized anxiety and myofascial pain syndrome and I think that is an underlying issue.  He seems to be somewhat depressed and the fact that he is not exercising is not getting in the pool and most of that was just fear that he was going to hurt himself.  I did talk to him at great length today we probably spent 40 minutes in the office today talking about his condition.  This was face-to-face evaluation and consultation.  We did talk to him  that he is safe to go in the pool and I really would encourage that even if he was in some pain I think it may actually help him.  He says he fears that if he gets in there it will actually hurt more afterwards and I tried to explain that no activity is  good to be just as bad is too much activity for him.    Meds & Orders: No orders of the defined types were placed in this encounter.  No orders of the defined types were placed in this encounter.   Follow-up: Return for L4-5 facet joint blocks on the left, MRI review after completion.   Procedures: No procedures performed  No notes on file   Clinical History: Lumbar spine MRI dated 05/29/2016   L4-5: Anterolisthesis, uncovered disc with small bulge, central protrusion, moderate facet and ligamentum flavum hypertrophy, and large right foraminal and extra foraminal marginal osteophyte. Severe right foraminal narrowing. Mild left foraminal narrowing. Effacement of lateral recesses with contact upon the descending right L5 nerve root. No significant canal stenosis.   L5-S1: Small disc bulge and mild facet/ligamentum flavum hypertrophy. Mild bilateral foraminal narrowing. Right-sided 5 mm T2 hyperintense structure arising from the right facet joint possibly impinging the exiting right L5 nerve root likely a synovial cyst  Mild S-shaped lumbar curvature, grade 1 L4-5 anterolisthesis, and prominent discogenic and facet arthropathy. No acute osseous abnormality.  No high-grade canal stenosis. 3. Multiple levels of foraminal narrowing moderate at left L2-3, moderate at right L3-4, and severe at right L4-5 levels. 4. Large marginal osteophytes with contact upon exiting left L2 nerve root at the L2-3 level and right L3 nerve root at the L3-4 level.    He reports that he has quit smoking. He has never used smokeless tobacco.  Recent Labs    12/29/18 1605  LABURIC 6.1    Objective:  VS:  HT:    WT:   BMI:     BP:120/67  HR:94bpm   TEMP: ( )  RESP:  Physical Exam Vitals and nursing note reviewed.  Constitutional:      General: He is not in acute distress.    Appearance: Normal appearance. He is well-developed. He is obese.  HENT:     Head: Normocephalic and atraumatic.  Eyes:     Conjunctiva/sclera: Conjunctivae normal.     Pupils: Pupils are equal, round, and reactive to light.  Cardiovascular:     Rate and Rhythm: Normal rate.     Pulses: Normal pulses.     Heart sounds: Normal heart sounds.  Pulmonary:     Effort: Pulmonary effort is normal. No respiratory distress.  Musculoskeletal:     Cervical back: Normal range of motion and neck supple. No rigidity.     Right lower leg: No edema.     Left lower leg: No edema.     Comments: Patient ambulates with aid of a cane and at his place of residence with a walker.  He ambulates with forward flexed lumbar spine.  He carries most of his weight in the abdomen.  He has no pain with hip rotation left or right internal or external.  No pain over the greater trochanters and he has good distal strength without any deficits.  He does have pain in the musculature on the left near the iliac crest into the midline in the paraspinal musculature.  This reproduces some of his pain but not his thigh pain.  Skin:    General: Skin is warm and dry.     Findings: No erythema or rash.  Neurological:     General: No focal deficit present.     Mental Status: He is alert and oriented to person, place, and time.     Sensory: No sensory deficit.     Coordination: Coordination normal.  Gait: Gait normal.  Psychiatric:        Mood and Affect: Mood normal.        Behavior: Behavior normal.     Ortho Exam Imaging: No results found.  Past Medical/Family/Surgical/Social History: Medications & Allergies reviewed per EMR, new medications updated. Patient Active Problem List   Diagnosis Date Noted  . Spondylosis without myelopathy or radiculopathy, lumbar region 08/12/2019  . Spinal  stenosis of lumbar region with neurogenic claudication 08/12/2019  . Myofascial pain syndrome 08/12/2019  . Chest pain 03/26/2019  . 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 Chronic pain syndrome 09/26/2015  . De Quervain's tenosynovitis, right 09/13/2015  . Anemia due to antineoplastic chemotherapy 08/15/2015  . DLBCL (diffuse large B cell lymphoma) ([place]) 08/15/2015  . Skin lesion of face 05/21/2015  . Peripheral neuropathy due to chemotherapy ([place]) 02/09/2015  . Benign prostatic hyperplasia with urinary obstruction 02/09/2015  . Bilateral hearing loss 02/09/2015  . Low back pain 02/09/2015  . Chronic kidney disease (CKD), stage III (moderate) 02/09/2015  . Essential (primary) hypertension 02/09/2015  . Gastro-esophageal reflux disease without esophagitis 02/09/2015  . Anxiety, generalized 02/09/2015  . Cannot sleep 02/09/2015  . Combined fat and carbohydrate induced hyperlipemia 02/09/2015   Past Medical History:  Diagnosis Date  . Anemia   . Anemia due to antineoplastic chemotherapy 08/15/2015  . Anxiety   . Arthritis   . Cataract   . Chronic kidney disease (CKD), stage III (moderate) 02/09/2015   Controlled  . CMML (chronic myelomonocytic leukemia) ([place])    gets Aranesp about monthly, otherwise surveillance with Dr. Marin Olp  . Combined fat and carbohydrate induced hyperlipemia 02/09/2015   Controlled  . Depression   . DLBCL (diffuse large B cell lymphoma) ([place]) 08/15/2015  . Essential (primary) hypertension 02/09/2015  . Gastro-esophageal reflux disease without esophagitis 02/09/2015   Controlled  . Tinnitus    Family History  Problem Relation Age of Onset  . Anuerysm Mother 55       Deceased  . Colon cancer Father 46       Deceased  . Colon cancer Brother   . Prostate cancer Brother        Deceased  . Alzheimer's disease Brother   . Heart disease Brother   . Alzheimer's disease Paternal Grandmother   . Early death Maternal  Grandmother        Unknown  . Obesity Son   . Obesity Son        Gastric Bypass  . Healthy Son   . Healthy Daughter   . Diabetes Neg Hx    Past Surgical History:  Procedure Laterality Date  . CATARACT EXTRACTION, BILATERAL    . COLONOSCOPY  May 2011  . CYSTOSCOPY    . TOTAL HIP ARTHROPLASTY  2003   Right   . TOTAL HIP ARTHROPLASTY  April, 2011   Left  . TRIGGER FINGER RELEASE  Nov 2013  . TURBINECTOMY  2006  . UPPER GI ENDOSCOPY  Nov 2015   Social History   Occupational History  . Not on file  Tobacco Use  . Smoking status: Former Research scientist (life sciences)  . Smokeless tobacco: Never Used  . Tobacco comment: Quit >50 years ago  Substance and Sexual Activity  . Alcohol use: Yes    Alcohol/week: 1.0 - 4.0 standard drinks    Types: 1 - 4 Shots of liquor per week    Comment: 4 glasses per week  . Drug use: No  . Sexual  activity: Not on file

## 2019-08-17 ENCOUNTER — Ambulatory Visit (INDEPENDENT_AMBULATORY_CARE_PROVIDER_SITE_OTHER): Payer: Medicare Other | Admitting: Physical Medicine and Rehabilitation

## 2019-08-17 ENCOUNTER — Ambulatory Visit: Payer: Self-pay

## 2019-08-17 ENCOUNTER — Encounter: Payer: Self-pay | Admitting: Physical Medicine and Rehabilitation

## 2019-08-17 ENCOUNTER — Encounter: Payer: Self-pay | Admitting: Family

## 2019-08-17 ENCOUNTER — Inpatient Hospital Stay: Payer: Medicare Other | Attending: Hematology & Oncology

## 2019-08-17 ENCOUNTER — Other Ambulatory Visit: Payer: Self-pay

## 2019-08-17 ENCOUNTER — Inpatient Hospital Stay (HOSPITAL_BASED_OUTPATIENT_CLINIC_OR_DEPARTMENT_OTHER): Payer: Medicare Other | Admitting: Family

## 2019-08-17 ENCOUNTER — Inpatient Hospital Stay: Payer: Medicare Other

## 2019-08-17 VITALS — BP 117/82 | HR 81 | Temp 97.6°F | Resp 18 | Ht 68.0 in | Wt 208.0 lb

## 2019-08-17 VITALS — BP 118/62 | HR 73

## 2019-08-17 DIAGNOSIS — Z881 Allergy status to other antibiotic agents status: Secondary | ICD-10-CM | POA: Diagnosis not present

## 2019-08-17 DIAGNOSIS — D508 Other iron deficiency anemias: Secondary | ICD-10-CM

## 2019-08-17 DIAGNOSIS — C9310 Chronic myelomonocytic leukemia not having achieved remission: Secondary | ICD-10-CM

## 2019-08-17 DIAGNOSIS — G629 Polyneuropathy, unspecified: Secondary | ICD-10-CM | POA: Diagnosis not present

## 2019-08-17 DIAGNOSIS — C8330 Diffuse large B-cell lymphoma, unspecified site: Secondary | ICD-10-CM

## 2019-08-17 DIAGNOSIS — Z79899 Other long term (current) drug therapy: Secondary | ICD-10-CM | POA: Insufficient documentation

## 2019-08-17 DIAGNOSIS — M549 Dorsalgia, unspecified: Secondary | ICD-10-CM | POA: Diagnosis not present

## 2019-08-17 DIAGNOSIS — Z8572 Personal history of non-Hodgkin lymphomas: Secondary | ICD-10-CM | POA: Insufficient documentation

## 2019-08-17 DIAGNOSIS — D6481 Anemia due to antineoplastic chemotherapy: Secondary | ICD-10-CM

## 2019-08-17 DIAGNOSIS — Z888 Allergy status to other drugs, medicaments and biological substances status: Secondary | ICD-10-CM | POA: Insufficient documentation

## 2019-08-17 DIAGNOSIS — M47816 Spondylosis without myelopathy or radiculopathy, lumbar region: Secondary | ICD-10-CM | POA: Diagnosis not present

## 2019-08-17 DIAGNOSIS — T451X5A Adverse effect of antineoplastic and immunosuppressive drugs, initial encounter: Secondary | ICD-10-CM

## 2019-08-17 DIAGNOSIS — Z886 Allergy status to analgesic agent status: Secondary | ICD-10-CM | POA: Diagnosis not present

## 2019-08-17 DIAGNOSIS — R5383 Other fatigue: Secondary | ICD-10-CM | POA: Insufficient documentation

## 2019-08-17 LAB — CMP (CANCER CENTER ONLY)
ALT: 11 U/L (ref 0–44)
AST: 14 U/L — ABNORMAL LOW (ref 15–41)
Albumin: 4.3 g/dL (ref 3.5–5.0)
Alkaline Phosphatase: 63 U/L (ref 38–126)
Anion gap: 8 (ref 5–15)
BUN: 26 mg/dL — ABNORMAL HIGH (ref 8–23)
CO2: 28 mmol/L (ref 22–32)
Calcium: 10 mg/dL (ref 8.9–10.3)
Chloride: 104 mmol/L (ref 98–111)
Creatinine: 1.69 mg/dL — ABNORMAL HIGH (ref 0.61–1.24)
GFR, Est AFR Am: 41 mL/min — ABNORMAL LOW (ref >60)
GFR, Estimated: 35 mL/min — ABNORMAL LOW (ref >60)
Glucose, Bld: 104 mg/dL — ABNORMAL HIGH (ref 70–99)
Potassium: 4.6 mmol/L (ref 3.5–5.1)
Sodium: 140 mmol/L (ref 135–145)
Total Bilirubin: 0.8 mg/dL (ref 0.3–1.2)
Total Protein: 7.6 g/dL (ref 6.5–8.1)

## 2019-08-17 LAB — CBC WITH DIFFERENTIAL (CANCER CENTER ONLY)
Abs Immature Granulocytes: 0.04 10*3/uL (ref 0.00–0.07)
Basophils Absolute: 0.1 10*3/uL (ref 0.0–0.1)
Basophils Relative: 1 %
Eosinophils Absolute: 0.1 10*3/uL (ref 0.0–0.5)
Eosinophils Relative: 1 %
HCT: 29.7 % — ABNORMAL LOW (ref 39.0–52.0)
Hemoglobin: 9.5 g/dL — ABNORMAL LOW (ref 13.0–17.0)
Immature Granulocytes: 1 %
Lymphocytes Relative: 28 %
Lymphs Abs: 1.6 10*3/uL (ref 0.7–4.0)
MCH: 37.4 pg — ABNORMAL HIGH (ref 26.0–34.0)
MCHC: 32 g/dL (ref 30.0–36.0)
MCV: 116.9 fL — ABNORMAL HIGH (ref 80.0–100.0)
Monocytes Absolute: 2.1 10*3/uL — ABNORMAL HIGH (ref 0.1–1.0)
Monocytes Relative: 36 %
Neutro Abs: 1.9 10*3/uL (ref 1.7–7.7)
Neutrophils Relative %: 33 %
Platelet Count: 79 10*3/uL — ABNORMAL LOW (ref 150–400)
RBC: 2.54 MIL/uL — ABNORMAL LOW (ref 4.22–5.81)
RDW: 15.6 % — ABNORMAL HIGH (ref 11.5–15.5)
WBC Count: 5.7 10*3/uL (ref 4.0–10.5)
nRBC: 0 % (ref 0.0–0.2)

## 2019-08-17 LAB — RETICULOCYTES
Immature Retic Fract: 20.1 % — ABNORMAL HIGH (ref 2.3–15.9)
RBC.: 2.56 MIL/uL — ABNORMAL LOW (ref 4.22–5.81)
Retic Count, Absolute: 26.9 10*3/uL (ref 19.0–186.0)
Retic Ct Pct: 1.1 % (ref 0.4–3.1)

## 2019-08-17 LAB — LACTATE DEHYDROGENASE: LDH: 215 U/L — ABNORMAL HIGH (ref 98–192)

## 2019-08-17 MED ORDER — METHYLPREDNISOLONE ACETATE 80 MG/ML IJ SUSP
40.0000 mg | Freq: Once | INTRAMUSCULAR | Status: AC
  Administered 2019-08-17: 13:00:00 40 mg

## 2019-08-17 MED ORDER — DARBEPOETIN ALFA 500 MCG/ML IJ SOSY
500.0000 ug | PREFILLED_SYRINGE | Freq: Once | INTRAMUSCULAR | Status: AC
  Administered 2019-08-17: 500 ug via SUBCUTANEOUS

## 2019-08-17 MED ORDER — DARBEPOETIN ALFA 500 MCG/ML IJ SOSY
PREFILLED_SYRINGE | INTRAMUSCULAR | Status: AC
  Filled 2019-08-17: qty 1

## 2019-08-17 NOTE — Progress Notes (Signed)
 .  Numeric Pain Rating Scale and Functional Assessment Average Pain 2   In the last MONTH (on 0-10 scale) has pain interfered with the following?  1. General activity like being  able to carry out your everyday physical activities such as walking, climbing stairs, carrying groceries, or moving a chair?  Rating(2)   +Driver, -BT, -Dye Allergies.

## 2019-08-17 NOTE — Progress Notes (Signed)
Hematology and Oncology Follow Up Visit  Jeremiah Walker [id number] 09-Jun-1928 86 y.o. 08/17/2019   Principle Diagnosis:  History of diffuse large cell non-Hodgkin's lymphoma-treated in [place] Chronic Myelomonocytic Leukemia (CMMoL) - Normal cytogenetics  Current Therapy:   Aranesp 300 mcg sq q 3 weeks for Hgb < 11   Interim History:  Mr. Rosenberg is here today for follow-up and Aranesp injection. Hgb is 9.5. he is symptomatic with fatigue.  He has not noted any blood loss. Platelet count is 79. He does bruise easily on his hands. No petechial rash.  He is still having back pain and goes later today for steroid injection. He states that he is also scheduled for MRI later this week on Wednesday.  No fever, chills, n/v, cough, rash, dizziness, SOB, chest pain, palpitations, abdominal pain or changes in bowel or bladder habits. No swelling in his extremities at this time.  He has neuropathy in his feet which he describes as stable and unchanged. No falls or syncopal episodes to report.  He does ambulate with a cane when out and a walker if needed.  He has intermittent numbness and tingling in the hands due to carpal tunnel.  He states that he has a good appetite and is staying well hydrated. His weight is stable.   ECOG Performance Status: 1 - Symptomatic but completely ambulatory  Medications:  Allergies as of 08/17/2019      Reactions   Nsaids Other (See Comments), Tinitus   Bruising   Statins Nausea And Vomiting, Other (See Comments)   Other reaction(s): Kidney issues Muscle pain   Tolmetin Other (See Comments)   Bruising   Ciprofloxacin Other (See Comments)   Other reaction(s):Achilles tendon   Ropinirole Anxiety   Mood swing   Nitroglycerin    Requip [ropinirole Hcl] Anxiety      Medication List       Accurate as of August 17, 2019 11:51 AM. If you have any questions, ask your nurse or doctor.        allopurinol 100 MG tablet Commonly known as: ZYLOPRIM TAKE  2 TABLETS(200 MG) BY MOUTH DAILY   ALPRAZolam 0.5 MG tablet Commonly known as: XANAX TAKE 1 TABLET(0.5 MG) BY MOUTH THREE TIMES DAILY AS NEEDED FOR ANXIETY   amoxicillin 500 MG capsule Commonly known as: AMOXIL Take 4 caps (2gm) by mouth once prior to dental procedure   azelastine 0.1 % nasal spray Commonly known as: ASTELIN Place 2 sprays into both nostrils 2 (two) times daily. Use in each nostril as directed   b complex vitamins tablet Take 1 tablet by mouth daily.   baclofen 10 MG tablet Commonly known as: LIORESAL Take 1/2 to 1 by mouth every 8hrs as needed for spasm   bethanechol 25 MG tablet Commonly known as: URECHOLINE TAKE 1 TABLET(25 MG) BY MOUTH THREE TIMES DAILY   Calcium 500 MG tablet Take 600 mg by mouth daily.   folic acid 1 MG tablet Commonly known as: FOLVITE TAKE 2 TABLETS(2 MG) BY MOUTH DAILY   HYDROcodone-acetaminophen 7.5-325 MG tablet Commonly known as: NORCO Take 1 tablet by mouth every 6 (six) hours as needed for moderate pain.   Magnesium Oxide 200 MG Tabs Take 200 mg by mouth daily.   Melatonin 10 MG Tabs Take 20 mg by mouth. Take 20 mg daily as needed at bedtime.   multivitamin tablet Take 1 tablet by mouth daily.   OMEGA-3 FISH OIL PO Take 2 capsules by mouth daily. EPA/DHA 520/350  polyethylene glycol 17 g packet Commonly known as: MIRALAX / GLYCOLAX Take 17 g by mouth daily as needed for mild constipation.   predniSONE 20 MG tablet Commonly known as: DELTASONE May take 1 tablet daily for up to 3 days as needed for gout attack or muscle spasm   pregabalin 25 MG capsule Commonly known as: Lyrica Take 1 capsule (25 mg total) by mouth 3 (three) times daily.   UNABLE TO FIND Take 1 tablet by mouth daily. Med Name: B-6 VITAMIN   vitamin C 500 MG tablet Commonly known as: ASCORBIC ACID Take 500 mg by mouth daily.   Vitamin D3 50 MCG (2000 UT) capsule Take 2,000 Units by mouth daily.   zinc gluconate 50 MG tablet Take 50 mg  by mouth daily.       Allergies:  Allergies  Allergen Reactions  . Nsaids Other (See Comments) and Tinitus    Bruising   . Statins Nausea And Vomiting and Other (See Comments)    Other reaction(s): Kidney issues Muscle pain   . Tolmetin Other (See Comments)    Bruising  . Ciprofloxacin Other (See Comments)    Other reaction(s):Achilles tendon   . Ropinirole Anxiety    Mood swing  . Nitroglycerin   . Requip [Ropinirole Hcl] Anxiety    Past Medical History, Surgical history, Social history, and Family History were reviewed and updated.  Review of Systems: All other 10 point review of systems is negative.   Physical Exam:  vitals were not taken for this visit.   Wt Readings from Last 3 Encounters:  07/17/19 209 lb (94.8 kg)  06/29/19 202 lb (91.6 kg)  06/17/19 209 lb (94.8 kg)    Ocular: Sclerae unicteric, pupils equal, round and reactive to light Ear-nose-throat: Oropharynx clear, dentition fair Lymphatic: No cervical or supraclavicular adenopathy Lungs no rales or rhonchi, good excursion bilaterally Heart regular rate and rhythm, no murmur appreciated Abd soft, nontender, positive bowel sounds, no liver or spleen tip palpated on exam, no fluid wave  MSK no focal spinal tenderness, no joint edema Neuro: non-focal, well-oriented, appropriate affect Breasts: Deferred   Lab Results  Component Value Date   WBC 5.7 08/17/2019   HGB 9.5 (L) 08/17/2019   HCT 29.7 (L) 08/17/2019   MCV 116.9 (H) 08/17/2019   PLT 79 (L) 08/17/2019   Lab Results  Component Value Date   FERRITIN 401 (H) 07/17/2019   IRON 144 07/17/2019   TIBC 253 07/17/2019   UIBC 109 (L) 07/17/2019   IRONPCTSAT 57 (H) 07/17/2019   Lab Results  Component Value Date   RETICCTPCT 1.1 08/17/2019   RBC 2.56 (L) 08/17/2019   RBC 2.54 (L) 08/17/2019   No results found for: KPAFRELGTCHN, LAMBDASER, KAPLAMBRATIO No results found for: IGGSERUM, IGA, IGMSERUM No results found for: Odetta Pink, SPEI   Chemistry      Component Value Date/Time   NA 140 08/17/2019 1101   NA 139 05/08/2017 1123   NA 141 05/15/2016 1126   K 4.6 08/17/2019 1101   K 4.0 05/08/2017 1123   K 4.3 05/15/2016 1126   CL 104 08/17/2019 1101   CL 105 05/08/2017 1123   CO2 28 08/17/2019 1101   CO2 30 05/08/2017 1123   CO2 26 05/15/2016 1126   BUN 26 (H) 08/17/2019 1101   BUN 15 05/08/2017 1123   BUN 29.1 (H) 05/15/2016 1126   CREATININE 1.69 (H) 08/17/2019 1101   CREATININE 1.2 05/08/2017 1123  CREATININE 1.7 (H) 05/15/2016 1126   GLU 90 11/15/2014 0000      Component Value Date/Time   CALCIUM 10.0 08/17/2019 1101   CALCIUM 9.1 05/08/2017 1123   CALCIUM 9.7 05/15/2016 1126   ALKPHOS 63 08/17/2019 1101   ALKPHOS 64 05/08/2017 1123   ALKPHOS 69 05/15/2016 1126   AST 14 (L) 08/17/2019 1101   AST 23 05/15/2016 1126   ALT 11 08/17/2019 1101   ALT 19 05/08/2017 1123   ALT 22 05/15/2016 1126   BILITOT 0.8 08/17/2019 1101   BILITOT 0.84 05/15/2016 1126       Impression and Plan: Mr.Vincentis a 86yo caucasian gentleman withhistory of diffuse large cell non-Hodgkin's lymphomatreated with6 cycles of R-CHOP completed in April 2014.  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 He got Aranesp today for Hgb 9.5.  We will continue to follow along closely with him and see him again in 1 month.  He will contact our office with any questions or concerns or EMS in the event of an emergency.   Laverna Peace, NP 2/22/202111:51 AM

## 2019-08-17 NOTE — Patient Instructions (Signed)
Darbepoetin Alfa injection What is this medicine? DARBEPOETIN ALFA (dar be POE e tin AL fa) helps your body make more red blood cells. It is used to treat anemia caused by chronic kidney failure and chemotherapy. This medicine may be used for other purposes; ask your health care provider or pharmacist if you have questions. COMMON BRAND NAME(S): Aranesp What should I tell my health care provider before I take this medicine? They need to know if you have any of these conditions:  blood clotting disorders or history of blood clots  cancer patient not on chemotherapy  cystic fibrosis  heart disease, such as angina, heart failure, or a history of a heart attack  hemoglobin level of 12 g/dL or greater  high blood pressure  low levels of folate, iron, or vitamin B12  seizures  an unusual or allergic reaction to darbepoetin, erythropoietin, albumin, hamster proteins, latex, other medicines, foods, dyes, or preservatives  pregnant or trying to get pregnant  breast-feeding How should I use this medicine? This medicine is for injection into a vein or under the skin. It is usually given by a health care professional in a hospital or clinic setting. If you get this medicine at home, you will be taught how to prepare and give this medicine. Use exactly as directed. Take your medicine at regular intervals. Do not take your medicine more often than directed. It is important that you put your used needles and syringes in a special sharps container. Do not put them in a trash can. If you do not have a sharps container, call your pharmacist or healthcare provider to get one. A special MedGuide will be given to you by the pharmacist with each prescription and refill. Be sure to read this information carefully each time. Talk to your pediatrician regarding the use of this medicine in children. While this medicine may be used in children as young as 1 month of age for selected conditions, precautions do  apply. Overdosage: If you think you have taken too much of this medicine contact a poison control center or emergency room at once. NOTE: This medicine is only for you. Do not share this medicine with others. What if I miss a dose? If you miss a dose, take it as soon as you can. If it is almost time for your next dose, take only that dose. Do not take double or extra doses. What may interact with this medicine? Do not take this medicine with any of the following medications:  epoetin alfa This list may not describe all possible interactions. Give your health care provider a list of all the medicines, herbs, non-prescription drugs, or dietary supplements you use. Also tell them if you smoke, drink alcohol, or use illegal drugs. Some items may interact with your medicine. What should I watch for while using this medicine? Your condition will be monitored carefully while you are receiving this medicine. You may need blood work done while you are taking this medicine. This medicine may cause a decrease in vitamin B6. You should make sure that you get enough vitamin B6 while you are taking this medicine. Discuss the foods you eat and the vitamins you take with your health care professional. What side effects may I notice from receiving this medicine? Side effects that you should report to your doctor or health care professional as soon as possible:  allergic reactions like skin rash, itching or hives, swelling of the face, lips, or tongue  breathing problems  changes in   vision  chest pain  confusion, trouble speaking or understanding  feeling faint or lightheaded, falls  high blood pressure  muscle aches or pains  pain, swelling, warmth in the leg  rapid weight gain  severe headaches  sudden numbness or weakness of the face, arm or leg  trouble walking, dizziness, loss of balance or coordination  seizures (convulsions)  swelling of the ankles, feet, hands  unusually weak or  tired Side effects that usually do not require medical attention (report to your doctor or health care professional if they continue or are bothersome):  diarrhea  fever, chills (flu-like symptoms)  headaches  nausea, vomiting  redness, stinging, or swelling at site where injected This list may not describe all possible side effects. Call your doctor for medical advice about side effects. You may report side effects to FDA at 1-800-FDA-1088. Where should I keep my medicine? Keep out of the reach of children. Store in a refrigerator between 2 and 8 degrees C (36 and 46 degrees F). Do not freeze. Do not shake. Throw away any unused portion if using a single-dose vial. Throw away any unused medicine after the expiration date. NOTE: This sheet is a summary. It may not cover all possible information. If you have questions about this medicine, talk to your doctor, pharmacist, or health care provider.  2020 Elsevier/Gold Standard (2017-06-26 16:44:20)  

## 2019-08-18 LAB — FERRITIN: Ferritin: 420 ng/mL — ABNORMAL HIGH (ref 24–336)

## 2019-08-18 LAB — IRON AND TIBC
Iron: 105 ug/dL (ref 42–163)
Saturation Ratios: 42 % (ref 20–55)
TIBC: 248 ug/dL (ref 202–409)
UIBC: 143 ug/dL (ref 117–376)

## 2019-08-18 NOTE — Procedures (Signed)
Lumbar Facet Joint Intra-Articular Injection(s) with Fluoroscopic Guidance  Patient: Jeremiah Walker      Date of Birth: 03/08/1928 MRN: [id number] PCP: [name], MD      Visit Date: 08/17/2019   Universal Protocol:    Date/Time: 08/17/2019  Consent Given By: the patient  Position: PRONE   Additional Comments: Vital signs were monitored before and after the procedure. Patient was prepped and draped in the usual sterile fashion. The correct patient, procedure, and site was verified.   Injection Procedure Details:  Procedure Site One Meds Administered:  Meds ordered this encounter  Medications  . methylPREDNISolone acetate (DEPO-MEDROL) injection 40 mg     Laterality: Left  Location/Site:  L4-L5  Needle size: 22 guage  Needle type: Spinal  Needle Placement: Articular  Findings:  -Comments: Excellent flow of contrast producing a partial arthrogram.  Procedure Details: The fluoroscope beam is vertically oriented in AP, and the inferior recess is visualized beneath the lower pole of the inferior apophyseal process, which represents the target point for needle insertion. When direct visualization is difficult the target point is located at the medial projection of the vertebral pedicle. The region overlying each aforementioned target is locally anesthetized with a 1 to 2 ml. volume of 1% Lidocaine without Epinephrine.   The spinal needle was inserted into each of the above mentioned facet joints using biplanar fluoroscopic guidance. A 0.25 to 0.5 ml. volume of Isovue-250 was injected and a partial facet joint arthrogram was obtained. A single spot film was obtained of the resulting arthrogram.    One to 1.25 ml of the steroid/anesthetic solution was then injected into each of the facet joints noted above.   Additional Comments:  The patient tolerated the procedure well Dressing: 2 x 2 sterile gauze and Band-Aid    Post-procedure details: Patient was observed  during the procedure. Post-procedure instructions were reviewed.  Patient left the clinic in stable condition.

## 2019-08-18 NOTE — Progress Notes (Signed)
[name] - 86 y.o. male MRN [id number]  Date of birth: 07/23/1927  Office Visit Note: Visit Date: 08/17/2019 PCP: [name], MD Referred by: [name], MD  Subjective: Chief Complaint  Patient presents with  . Lower Back - Pain   HPI:  Jeremiah Walker is a 86 y.o. male who comes in today For planned left L4-5 facet joint block.  Patient having axial left-sided low back pain.  The patient has failed conservative care including home exercise, medications, time and activity modification.  This injection will be diagnostic and hopefully therapeutic.  Please see requesting physician notes for further details and justification.   ROS Otherwise per HPI.  Assessment & Plan: Visit Diagnoses:  1. Spondylosis without myelopathy or radiculopathy, lumbar region     Plan: No additional findings.   Meds & Orders:  Meds ordered this encounter  Medications  . methylPREDNISolone acetate (DEPO-MEDROL) injection 40 mg    Orders Placed This Encounter  Procedures  . Facet Injection  . XR C-ARM NO REPORT    Follow-up: Return if symptoms worsen or fail to improve.   Procedures: No procedures performed  Lumbar Facet Joint Intra-Articular Injection(s) with Fluoroscopic Guidance  Patient: Jeremiah Walker      Date of Birth: 11-Mar-1928 MRN: [id number] PCP: [name], MD      Visit Date: 08/17/2019   Universal Protocol:    Date/Time: 08/17/2019  Consent Given By: the patient  Position: PRONE   Additional Comments: Vital signs were monitored before and after the procedure. Patient was prepped and draped in the usual sterile fashion. The correct patient, procedure, and site was verified.   Injection Procedure Details:  Procedure Site One Meds Administered:  Meds ordered this encounter  Medications  . methylPREDNISolone acetate (DEPO-MEDROL) injection 40 mg     Laterality: Left  Location/Site:  L4-L5  Needle size: 22 guage  Needle type:  Spinal  Needle Placement: Articular  Findings:  -Comments: Excellent flow of contrast producing a partial arthrogram.  Procedure Details: The fluoroscope beam is vertically oriented in AP, and the inferior recess is visualized beneath the lower pole of the inferior apophyseal process, which represents the target point for needle insertion. When direct visualization is difficult the target point is located at the medial projection of the vertebral pedicle. The region overlying each aforementioned target is locally anesthetized with a 1 to 2 ml. volume of 1% Lidocaine without Epinephrine.   The spinal needle was inserted into each of the above mentioned facet joints using biplanar fluoroscopic guidance. A 0.25 to 0.5 ml. volume of Isovue-250 was injected and a partial facet joint arthrogram was obtained. A single spot film was obtained of the resulting arthrogram.    One to 1.25 ml of the steroid/anesthetic solution was then injected into each of the facet joints noted above.   Additional Comments:  The patient tolerated the procedure well Dressing: 2 x 2 sterile gauze and Band-Aid    Post-procedure details: Patient was observed during the procedure. Post-procedure instructions were reviewed.  Patient left the clinic in stable condition.     Clinical History: Lumbar spine MRI dated 05/29/2016   L4-5: Anterolisthesis, uncovered disc with small bulge, central protrusion, moderate facet and ligamentum flavum hypertrophy, and large right foraminal and extra foraminal marginal osteophyte. Severe right foraminal narrowing. Mild left foraminal narrowing. Effacement of lateral recesses with contact upon the descending right L5 nerve root. No significant canal stenosis.   L5-S1: Small disc bulge  and mild facet/ligamentum flavum hypertrophy. Mild bilateral foraminal narrowing. Right-sided 5 mm T2 hyperintense structure arising from the right facet joint possibly impinging the exiting right  L5 nerve root likely a synovial cyst  Mild S-shaped lumbar curvature, grade 1 L4-5 anterolisthesis, and prominent discogenic and facet arthropathy. No acute osseous abnormality.  No high-grade canal stenosis. 3. Multiple levels of foraminal narrowing moderate at left L2-3, moderate at right L3-4, and severe at right L4-5 levels. 4. Large marginal osteophytes with contact upon exiting left L2 nerve root at the L2-3 level and right L3 nerve root at the L3-4 level.      Objective:  VS:  HT:    WT:   BMI:     BP:118/62  HR:73bpm  TEMP: ( )  RESP:  Physical Exam  Ortho Exam Imaging: XR C-ARM NO REPORT  Result Date: 08/17/2019 Please see Notes tab for imaging impression.

## 2019-08-19 ENCOUNTER — Ambulatory Visit
Admission: RE | Admit: 2019-08-19 | Discharge: 2019-08-19 | Disposition: A | Discharge status: Home / Self Care | Payer: Medicare Other | Source: Ambulatory Visit | Attending: Physical Medicine and Rehabilitation | Admitting: Physical Medicine and Rehabilitation

## 2019-08-19 DIAGNOSIS — M5126 Other intervertebral disc displacement, lumbar region: Secondary | ICD-10-CM | POA: Diagnosis not present

## 2019-08-19 DIAGNOSIS — M545 Low back pain, unspecified: Secondary | ICD-10-CM

## 2019-08-19 DIAGNOSIS — G8929 Other chronic pain: Secondary | ICD-10-CM

## 2019-08-19 DIAGNOSIS — M48062 Spinal stenosis, lumbar region with neurogenic claudication: Secondary | ICD-10-CM

## 2019-09-03 ENCOUNTER — Encounter: Payer: Self-pay | Admitting: Family Medicine

## 2019-09-03 DIAGNOSIS — M6281 Muscle weakness (generalized): Secondary | ICD-10-CM | POA: Diagnosis not present

## 2019-09-03 DIAGNOSIS — M5126 Other intervertebral disc displacement, lumbar region: Secondary | ICD-10-CM | POA: Diagnosis not present

## 2019-09-03 DIAGNOSIS — R293 Abnormal posture: Secondary | ICD-10-CM | POA: Diagnosis not present

## 2019-09-03 DIAGNOSIS — R2689 Other abnormalities of gait and mobility: Secondary | ICD-10-CM | POA: Diagnosis not present

## 2019-09-03 DIAGNOSIS — M545 Low back pain: Secondary | ICD-10-CM | POA: Diagnosis not present

## 2019-09-03 DIAGNOSIS — G894 Chronic pain syndrome: Secondary | ICD-10-CM

## 2019-09-04 MED ORDER — HYDROCODONE-ACETAMINOPHEN 7.5-325 MG PO TABS: 1.0000 | ORAL_TABLET | Freq: Four times a day (QID) | ORAL | 0 refills | Status: DC | PRN

## 2019-09-08 ENCOUNTER — Ambulatory Visit (INDEPENDENT_AMBULATORY_CARE_PROVIDER_SITE_OTHER): Payer: Medicare Other | Admitting: Physical Medicine and Rehabilitation

## 2019-09-08 ENCOUNTER — Other Ambulatory Visit: Payer: Self-pay

## 2019-09-08 ENCOUNTER — Encounter: Payer: Self-pay | Admitting: Physical Medicine and Rehabilitation

## 2019-09-08 VITALS — BP 147/69 | HR 86

## 2019-09-08 DIAGNOSIS — M47816 Spondylosis without myelopathy or radiculopathy, lumbar region: Secondary | ICD-10-CM

## 2019-09-08 DIAGNOSIS — M545 Low back pain: Secondary | ICD-10-CM | POA: Diagnosis not present

## 2019-09-08 DIAGNOSIS — G8929 Other chronic pain: Secondary | ICD-10-CM

## 2019-09-08 DIAGNOSIS — M5116 Intervertebral disc disorders with radiculopathy, lumbar region: Secondary | ICD-10-CM

## 2019-09-08 NOTE — Progress Notes (Signed)
Jeremiah Walker - 86 y.o. male MRN [id number]  Date of birth: [birth year]  Office Visit Note: Visit Date: 09/08/2019 PCP: [name], MD Referred by: [name], MD  Subjective: Chief Complaint  Patient presents with  . Lower Back - Pain   HPI: Jeremiah Walker is a 86 y.o. male who comes in today For reevaluation and management and MRI review of the lumbar spine.  By way of review the patient's had chronic back pain for many many many years and he has had multiple bouts of physical therapy and different treatments.  When I saw him we initially completed facet joint blocks which were somewhat beneficial for his lower back pain but he continued to have this left flank pain above the iliac crest.  At least on one occasion trigger point injection was really beneficial for him for several months.  He has had a rough time during the coronavirus pandemic where he could not get to the pool and exercise at his place of living.  He is now able to do that and that is making him feel better.  He reports the last injection we performed which was facet joint block on the left at L4-5 gave him great relief.  He rates his pain really only as a 2 out of 10 but he is continue to use pain medication which is Vicodin.  He does use that during the day and this is provided by his primary care physician.  He has really only need for that usually in the mornings.  We had ordered MRI of the lumbar spine in the middle of trying to do the injection just because the amount of pain he was having.  Interestingly the MRI did show pretty significant disc herniation extrusion at L1-2 likely impacting the left L2 nerve root.  This was reviewed with him today with imaging and spine models.  Report was reviewed.  I also reviewed the notes of his primary care provider.  Today he is really doing fairly well pain is somewhat controlled and he is really able to exercise at this point and actually is making him fairly happy.   He has has no radicular leg pain no thigh pain.  No paresthesias.  Review of Systems  Musculoskeletal: Positive for back pain.  All other systems reviewed and are negative.  Otherwise per HPI.  Assessment & Plan: Visit Diagnoses:  1. Spondylosis without myelopathy or radiculopathy, lumbar region   2. Chronic left-sided low back pain without sciatica   3. Radiculopathy due to lumbar intervertebral disc disorder     Plan: Findings:  Low back pain with general degenerative spondylosis but now with disc herniation extrusion at L1-2 which I think may have been the culprit that was given him a lot of his pain while we were trying to help him a few months ago.  Interestingly last injection did seem to help his back pain quite a bit and this was more of a facet joint block at L4-5.  At this point he seems to be doing well with current medication and exercise.  He is not going up in the pain medication and this is continue to be reviewed by his primary care physician.  If his symptoms flareup I would complete L2-3 transforaminal injection.  We did complete an injection at that level from an interlaminar approach but was very hard to do because of his anatomy.  Definitely would try transforaminal approach depending on where he has had  in the future.  This disc herniation will probably resorb over time if he is just able to get along okay.  We will see him back as needed he will continue with home exercise and swimming.    Meds & Orders: No orders of the defined types were placed in this encounter.  No orders of the defined types were placed in this encounter.   Follow-up: Return if symptoms worsen or fail to improve.   Procedures: No procedures performed  No notes on file   Clinical History: MRI LUMBAR SPINE WITHOUT CONTRAST  TECHNIQUE: Multiplanar, multisequence MR imaging of the lumbar spine was performed. No intravenous contrast was administered.  COMPARISON:  MRI lumbar spine 05/29/2016.  Plain films lumbar spine 04/23/2016.  FINDINGS: Segmentation:  Standard.  Alignment: Unchanged. Again seen is mild convex left scoliosis and 0.5 cm anterolisthesis L4 on L5.  Vertebrae: No fracture, evidence of discitis, or bone lesion. Degenerative endplate signal change I9-6, L2-3 and L3-4 again seen.  Conus medullaris and cauda equina: Conus extends to the T12-L1 level. Conus and cauda equina appear normal.  Paraspinal and other soft tissues: Renal cyst noted.  Disc levels:  T10-11 and T11-12 are imaged in the sagittal plane only and negative.  T12-L1: Minimal disc bulge.  No stenosis.  L1-2: There is a large disc extrusion extending cephalad in a left paracentral and lateral recess position. The fragment measures approximately 2.5 cm craniocaudal by 0.8 cm AP by 1.5 cm transverse and extends almost to the T12-L1 interspace. The disc deforms the left aspect of the thecal sac and impinges on the left L1 root. It also causes narrowing in the left subarticular recess at the level of the L1-2 interspace and could impact the left L2 root. Moderate left foraminal narrowing is present. Right foramen open.  L2-3: Loss of disc space height with a shallow bulge and endplate spur. Moderate facet arthropathy. There is mild narrowing in the left lateral recess. Mild bilateral foraminal narrowing. No change.  L3-4: Mild-to-moderate bilateral facet degenerative change. Shallow disc bulge. The central canal and left foramen are open. Mild right foraminal narrowing. No change.  L4-5: Bilateral facet degenerative disease is worse on the right. The disc is uncovered with a shallow bulge. The central canal and left foramen are open. There is moderate right foraminal narrowing. No change.  L5-S1: Advanced bilateral facet degenerative change. Shallow disc bulge. No stenosis.  IMPRESSION: Large cephalad extending disc extrusion in a left paracentral and lateral recess  position at L1-2 extends nearly to the T12-L1 interspace and impinges on the descending left L1 root. The disc also likely impacts the left L2 root at the level of the L1-2 disc interspace. There is moderate left foraminal narrowing at L1-2. The appearance of the lumbar spine is otherwise unchanged.  Mild left lateral recess narrowing at L2-3 due to a disc bulge.  Moderate right foraminal narrowing L4-5.   Electronically Signed   By: Inge Rise M.D.   On: 08/19/2019 13:31   He reports that he has quit smoking. He has never used smokeless tobacco.  Recent Labs    12/29/18 1605  LABURIC 6.1    Objective:  VS:  HT:    WT:   BMI:     BP:(!) 147/69  HR:86bpm  TEMP: ( )  RESP:  Physical Exam Vitals and nursing note reviewed.  Constitutional:      General: He is not in acute distress.    Appearance: He is well-developed. He is obese.  HENT:     Head: Normocephalic and atraumatic.     Nose: Nose normal.     Mouth/Throat:     Mouth: Mucous membranes are moist.     Pharynx: Oropharynx is clear.  Eyes:     Conjunctiva/sclera: Conjunctivae normal.     Pupils: Pupils are equal, round, and reactive to light.  Neck:     Trachea: No tracheal deviation.  Cardiovascular:     Rate and Rhythm: Normal rate and regular rhythm.     Pulses: Normal pulses.  Pulmonary:     Effort: Pulmonary effort is normal.     Breath sounds: Normal breath sounds.  Abdominal:     General: There is no distension.     Palpations: Abdomen is soft.     Tenderness: There is no guarding or rebound.  Musculoskeletal:        General: No deformity.     Cervical back: Normal range of motion and neck supple.     Right lower leg: No edema.     Left lower leg: No edema.     Comments: Patient ambulates with a cane he does ambulate with a forward flexed lumbar spine.  He is stiff with movement of the lumbar spine and pain with extension and rotation and facet loading.  He seems to be moving better and  getting along better than the last time I saw him.  He has no pain with hip rotation has good distal strength.  Skin:    General: Skin is warm and dry.     Findings: No erythema or rash.  Neurological:     General: No focal deficit present.     Mental Status: He is alert and oriented to person, place, and time.     Motor: No abnormal muscle tone.     Coordination: Coordination normal.     Gait: Gait normal.  Psychiatric:        Mood and Affect: Mood normal.        Behavior: Behavior normal.        Thought Content: Thought content normal.     Ortho Exam Imaging: No results found.  Past Medical/Family/Surgical/Social History: Medications & Allergies reviewed per EMR, new medications updated. Patient Active Problem List   Diagnosis Date Noted  . Spondylosis without myelopathy or radiculopathy, lumbar region 08/12/2019  . Spinal stenosis of lumbar region with neurogenic claudication 08/12/2019  . Myofascial pain syndrome 08/12/2019  . Chest pain 03/26/2019  . 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 Chronic pain syndrome 09/26/2015  . De Quervain's tenosynovitis, right 09/13/2015  . Anemia due to antineoplastic chemotherapy 08/15/2015  . DLBCL (diffuse large B cell lymphoma) ([place]) 08/15/2015  . Skin lesion of face 05/21/2015  . Peripheral neuropathy due to chemotherapy ([place]) 02/09/2015  . Benign prostatic hyperplasia with urinary obstruction 02/09/2015  . Bilateral hearing loss 02/09/2015  . Low back pain 02/09/2015  . Chronic kidney disease (CKD), stage III (moderate) 02/09/2015  . Essential (primary) hypertension 02/09/2015  . Gastro-esophageal reflux disease without esophagitis 02/09/2015  . Anxiety, generalized 02/09/2015  . Cannot sleep 02/09/2015  . Combined fat and carbohydrate induced hyperlipemia 02/09/2015   Past Medical History:  Diagnosis Date  . Anemia   . Anemia due to antineoplastic chemotherapy 08/15/2015  . Anxiety    . Arthritis   . Cataract   . Chronic kidney disease (CKD), stage III (moderate) 02/09/2015   Controlled  . CMML (chronic myelomonocytic leukemia) ([place])  gets Aranesp about monthly, otherwise surveillance with Dr. Marin Olp  . Combined fat and carbohydrate induced hyperlipemia 02/09/2015   Controlled  . Depression   . DLBCL (diffuse large B cell lymphoma) ([place]) 08/15/2015  . Essential (primary) hypertension 02/09/2015  . Gastro-esophageal reflux disease without esophagitis 02/09/2015   Controlled  . Tinnitus    Family History  Problem Relation Age of Onset  . Anuerysm Mother 70       Deceased  . Colon cancer Father 31       Deceased  . Colon cancer Brother   . Prostate cancer Brother        Deceased  . Alzheimer's disease Brother   . Heart disease Brother   . Alzheimer's disease Paternal Grandmother   . Early death Maternal Grandmother        Unknown  . Obesity Son   . Obesity Son        Gastric Bypass  . Healthy Son   . Healthy Daughter   . Diabetes Neg Hx    Past Surgical History:  Procedure Laterality Date  . CATARACT EXTRACTION, BILATERAL    . COLONOSCOPY  May 2011  . CYSTOSCOPY    . TOTAL HIP ARTHROPLASTY  2003   Right   . TOTAL HIP ARTHROPLASTY  April, 2011   Left  . TRIGGER FINGER RELEASE  Nov 2013  . TURBINECTOMY  2006  . UPPER GI ENDOSCOPY  Nov 2015   Social History   Occupational History  . Not on file  Tobacco Use  . Smoking status: Former Research scientist (life sciences)  . Smokeless tobacco: Never Used  . Tobacco comment: Quit >50 years ago  Substance and Sexual Activity  . Alcohol use: Yes    Alcohol/week: 1.0 - 4.0 standard drinks    Types: 1 - 4 Shots of liquor per week    Comment: 4 glasses per week  . Drug use: No  . Sexual activity: Not on file

## 2019-09-08 NOTE — Progress Notes (Signed)
 .  Numeric Pain Rating Scale and Functional Assessment Average Pain 2   In the last MONTH (on 0-10 scale) has pain interfered with the following?  1. General activity like being  able to carry out your everyday physical activities such as walking, climbing stairs, carrying groceries, or moving a chair?  Rating(3)   +Driver, -BT, -Dye Allergies.

## 2019-09-10 DIAGNOSIS — M6281 Muscle weakness (generalized): Secondary | ICD-10-CM | POA: Diagnosis not present

## 2019-09-10 DIAGNOSIS — R293 Abnormal posture: Secondary | ICD-10-CM | POA: Diagnosis not present

## 2019-09-10 DIAGNOSIS — M5126 Other intervertebral disc displacement, lumbar region: Secondary | ICD-10-CM | POA: Diagnosis not present

## 2019-09-10 DIAGNOSIS — R2689 Other abnormalities of gait and mobility: Secondary | ICD-10-CM | POA: Diagnosis not present

## 2019-09-10 DIAGNOSIS — M545 Low back pain: Secondary | ICD-10-CM | POA: Diagnosis not present

## 2019-09-15 DIAGNOSIS — R2689 Other abnormalities of gait and mobility: Secondary | ICD-10-CM | POA: Diagnosis not present

## 2019-09-15 DIAGNOSIS — M5126 Other intervertebral disc displacement, lumbar region: Secondary | ICD-10-CM | POA: Diagnosis not present

## 2019-09-15 DIAGNOSIS — R293 Abnormal posture: Secondary | ICD-10-CM | POA: Diagnosis not present

## 2019-09-15 DIAGNOSIS — M545 Low back pain: Secondary | ICD-10-CM | POA: Diagnosis not present

## 2019-09-15 DIAGNOSIS — M6281 Muscle weakness (generalized): Secondary | ICD-10-CM | POA: Diagnosis not present

## 2019-09-17 ENCOUNTER — Other Ambulatory Visit: Payer: Self-pay | Admitting: *Deleted

## 2019-09-17 ENCOUNTER — Inpatient Hospital Stay: Payer: Medicare Other

## 2019-09-17 ENCOUNTER — Inpatient Hospital Stay: Payer: Medicare Other | Attending: Hematology & Oncology

## 2019-09-17 ENCOUNTER — Inpatient Hospital Stay (HOSPITAL_BASED_OUTPATIENT_CLINIC_OR_DEPARTMENT_OTHER): Payer: Medicare Other | Admitting: Family

## 2019-09-17 ENCOUNTER — Other Ambulatory Visit: Payer: Self-pay

## 2019-09-17 ENCOUNTER — Encounter: Payer: Self-pay | Admitting: Family

## 2019-09-17 VITALS — BP 102/61 | HR 87 | Temp 98.0°F | Wt 211.8 lb

## 2019-09-17 DIAGNOSIS — C9310 Chronic myelomonocytic leukemia not having achieved remission: Secondary | ICD-10-CM

## 2019-09-17 DIAGNOSIS — G629 Polyneuropathy, unspecified: Secondary | ICD-10-CM | POA: Diagnosis not present

## 2019-09-17 DIAGNOSIS — Z8572 Personal history of non-Hodgkin lymphomas: Secondary | ICD-10-CM | POA: Insufficient documentation

## 2019-09-17 DIAGNOSIS — R0602 Shortness of breath: Secondary | ICD-10-CM | POA: Insufficient documentation

## 2019-09-17 DIAGNOSIS — D6481 Anemia due to antineoplastic chemotherapy: Secondary | ICD-10-CM | POA: Diagnosis not present

## 2019-09-17 DIAGNOSIS — T451X5A Adverse effect of antineoplastic and immunosuppressive drugs, initial encounter: Secondary | ICD-10-CM

## 2019-09-17 DIAGNOSIS — R531 Weakness: Secondary | ICD-10-CM | POA: Diagnosis not present

## 2019-09-17 DIAGNOSIS — R5383 Other fatigue: Secondary | ICD-10-CM | POA: Insufficient documentation

## 2019-09-17 DIAGNOSIS — C8330 Diffuse large B-cell lymphoma, unspecified site: Secondary | ICD-10-CM

## 2019-09-17 DIAGNOSIS — Z888 Allergy status to other drugs, medicaments and biological substances status: Secondary | ICD-10-CM | POA: Insufficient documentation

## 2019-09-17 DIAGNOSIS — Z886 Allergy status to analgesic agent status: Secondary | ICD-10-CM | POA: Insufficient documentation

## 2019-09-17 DIAGNOSIS — Z881 Allergy status to other antibiotic agents status: Secondary | ICD-10-CM | POA: Insufficient documentation

## 2019-09-17 DIAGNOSIS — D508 Other iron deficiency anemias: Secondary | ICD-10-CM

## 2019-09-17 LAB — CBC WITH DIFFERENTIAL (CANCER CENTER ONLY)
Abs Immature Granulocytes: 0.06 10*3/uL (ref 0.00–0.07)
Basophils Absolute: 0 10*3/uL (ref 0.0–0.1)
Basophils Relative: 1 %
Eosinophils Absolute: 0 10*3/uL (ref 0.0–0.5)
Eosinophils Relative: 1 %
HCT: 26.6 % — ABNORMAL LOW (ref 39.0–52.0)
Hemoglobin: 8.6 g/dL — ABNORMAL LOW (ref 13.0–17.0)
Immature Granulocytes: 1 %
Lymphocytes Relative: 24 %
Lymphs Abs: 1.2 10*3/uL (ref 0.7–4.0)
MCH: 37.9 pg — ABNORMAL HIGH (ref 26.0–34.0)
MCHC: 32.3 g/dL (ref 30.0–36.0)
MCV: 117.2 fL — ABNORMAL HIGH (ref 80.0–100.0)
Monocytes Absolute: 1.4 10*3/uL — ABNORMAL HIGH (ref 0.1–1.0)
Monocytes Relative: 28 %
Neutro Abs: 2.2 10*3/uL (ref 1.7–7.7)
Neutrophils Relative %: 45 %
Platelet Count: 69 10*3/uL — ABNORMAL LOW (ref 150–400)
RBC: 2.27 MIL/uL — ABNORMAL LOW (ref 4.22–5.81)
RDW: 16.2 % — ABNORMAL HIGH (ref 11.5–15.5)
WBC Count: 5 10*3/uL (ref 4.0–10.5)
nRBC: 0 % (ref 0.0–0.2)

## 2019-09-17 LAB — CMP (CANCER CENTER ONLY)
ALT: 11 U/L (ref 0–44)
AST: 15 U/L (ref 15–41)
Albumin: 4 g/dL (ref 3.5–5.0)
Alkaline Phosphatase: 57 U/L (ref 38–126)
Anion gap: 11 (ref 5–15)
BUN: 29 mg/dL — ABNORMAL HIGH (ref 8–23)
CO2: 26 mmol/L (ref 22–32)
Calcium: 9.3 mg/dL (ref 8.9–10.3)
Chloride: 105 mmol/L (ref 98–111)
Creatinine: 1.74 mg/dL — ABNORMAL HIGH (ref 0.61–1.24)
GFR, Est AFR Am: 39 mL/min — ABNORMAL LOW (ref >60)
GFR, Estimated: 34 mL/min — ABNORMAL LOW (ref >60)
Glucose, Bld: 182 mg/dL — ABNORMAL HIGH (ref 70–99)
Potassium: 4.1 mmol/L (ref 3.5–5.1)
Sodium: 142 mmol/L (ref 135–145)
Total Bilirubin: 1 mg/dL (ref 0.3–1.2)
Total Protein: 6.9 g/dL (ref 6.5–8.1)

## 2019-09-17 MED ORDER — DARBEPOETIN ALFA 500 MCG/ML IJ SOSY
500.0000 ug | PREFILLED_SYRINGE | Freq: Once | INTRAMUSCULAR | Status: AC
  Administered 2019-09-17: 500 ug via SUBCUTANEOUS

## 2019-09-17 MED ORDER — DARBEPOETIN ALFA 500 MCG/ML IJ SOSY
PREFILLED_SYRINGE | INTRAMUSCULAR | Status: AC
  Filled 2019-09-17: qty 1

## 2019-09-17 NOTE — Patient Instructions (Signed)
Darbepoetin Alfa injection What is this medicine? DARBEPOETIN ALFA (dar be POE e tin AL fa) helps your body make more red blood cells. It is used to treat anemia caused by chronic kidney failure and chemotherapy. This medicine may be used for other purposes; ask your health care provider or pharmacist if you have questions. COMMON BRAND NAME(S): Aranesp What should I tell my health care provider before I take this medicine? They need to know if you have any of these conditions:  blood clotting disorders or history of blood clots  cancer patient not on chemotherapy  cystic fibrosis  heart disease, such as angina, heart failure, or a history of a heart attack  hemoglobin level of 12 g/dL or greater  high blood pressure  low levels of folate, iron, or vitamin B12  seizures  an unusual or allergic reaction to darbepoetin, erythropoietin, albumin, hamster proteins, latex, other medicines, foods, dyes, or preservatives  pregnant or trying to get pregnant  breast-feeding How should I use this medicine? This medicine is for injection into a vein or under the skin. It is usually given by a health care professional in a hospital or clinic setting. If you get this medicine at home, you will be taught how to prepare and give this medicine. Use exactly as directed. Take your medicine at regular intervals. Do not take your medicine more often than directed. It is important that you put your used needles and syringes in a special sharps container. Do not put them in a trash can. If you do not have a sharps container, call your pharmacist or healthcare provider to get one. A special MedGuide will be given to you by the pharmacist with each prescription and refill. Be sure to read this information carefully each time. Talk to your pediatrician regarding the use of this medicine in children. While this medicine may be used in children as young as 1 month of age for selected conditions, precautions do  apply. Overdosage: If you think you have taken too much of this medicine contact a poison control center or emergency room at once. NOTE: This medicine is only for you. Do not share this medicine with others. What if I miss a dose? If you miss a dose, take it as soon as you can. If it is almost time for your next dose, take only that dose. Do not take double or extra doses. What may interact with this medicine? Do not take this medicine with any of the following medications:  epoetin alfa This list may not describe all possible interactions. Give your health care provider a list of all the medicines, herbs, non-prescription drugs, or dietary supplements you use. Also tell them if you smoke, drink alcohol, or use illegal drugs. Some items may interact with your medicine. What should I watch for while using this medicine? Your condition will be monitored carefully while you are receiving this medicine. You may need blood work done while you are taking this medicine. This medicine may cause a decrease in vitamin B6. You should make sure that you get enough vitamin B6 while you are taking this medicine. Discuss the foods you eat and the vitamins you take with your health care professional. What side effects may I notice from receiving this medicine? Side effects that you should report to your doctor or health care professional as soon as possible:  allergic reactions like skin rash, itching or hives, swelling of the face, lips, or tongue  breathing problems  changes in   vision  chest pain  confusion, trouble speaking or understanding  feeling faint or lightheaded, falls  high blood pressure  muscle aches or pains  pain, swelling, warmth in the leg  rapid weight gain  severe headaches  sudden numbness or weakness of the face, arm or leg  trouble walking, dizziness, loss of balance or coordination  seizures (convulsions)  swelling of the ankles, feet, hands  unusually weak or  tired Side effects that usually do not require medical attention (report to your doctor or health care professional if they continue or are bothersome):  diarrhea  fever, chills (flu-like symptoms)  headaches  nausea, vomiting  redness, stinging, or swelling at site where injected This list may not describe all possible side effects. Call your doctor for medical advice about side effects. You may report side effects to FDA at 1-800-FDA-1088. Where should I keep my medicine? Keep out of the reach of children. Store in a refrigerator between 2 and 8 degrees C (36 and 46 degrees F). Do not freeze. Do not shake. Throw away any unused portion if using a single-dose vial. Throw away any unused medicine after the expiration date. NOTE: This sheet is a summary. It may not cover all possible information. If you have questions about this medicine, talk to your doctor, pharmacist, or health care provider.  2020 Elsevier/Gold Standard (2017-06-26 16:44:20)  

## 2019-09-17 NOTE — Progress Notes (Signed)
Hematology and Oncology Follow Up Visit  Jeremiah Walker [id number] [birth year] 86 y.o. 09/17/2019   Principle Diagnosis:  History of diffuse large cell non-Hodgkin's lymphoma-treated in [place] Chronic Myelomonocytic Leukemia (CMMoL) - Normal cytogenetics  Current Therapy:   Aranesp 300 mcg sq q 3 weeks for Hgb < 11 Folic acid 2 mg PO daily   Interim History:  Jeremiah Walker is here today for an unscheduled visit due to symptoms of fatigue, weakness and SOB with exertion which started yesterday. He is not in any apparent distress at this time but does require a rolling walker with seat when ambulating for added support.  His Hgb is down to 8.6 and platelet count 69. WBC count is 5.0.  He drinks papaya juice regularly to help with his platelet count.  He has not noted any blood loss. No petechiae.  He does bruise quite a bit on Aspirin.  No fever, chills, n/v, cough, rash, dizziness, chest pain, palpitations, abdominal pain or changes in bowel or bladder habits.  No swelling in his extremities.  He has discomfort in the lower back and is followed closely by Jeremiah Walker.  He does PT several days a week and enjoys swimming.  The neuropathy in his feet seems a little better on Lyrica.  No falls or syncopal episodes to report.  He has maintained a good appetite and is staying well hydrated.  ECOG Performance Status: 1 - Symptomatic but completely ambulatory  Medications:  Allergies as of 09/17/2019      Reactions   Nsaids Other (See Comments), Tinitus   Bruising   Statins Nausea And Vomiting, Other (See Comments)   Other reaction(s): Kidney issues Muscle pain   Tolmetin Other (See Comments)   Bruising   Ciprofloxacin Other (See Comments)   Other reaction(s):Achilles tendon   Ropinirole Anxiety   Mood swing   Nitroglycerin    Requip [ropinirole Hcl] Anxiety      Medication List       Accurate as of September 17, 2019  2:55 PM. If you have any questions, ask your nurse or  doctor.        allopurinol 100 MG tablet Commonly known as: ZYLOPRIM TAKE 2 TABLETS(200 MG) BY MOUTH DAILY   ALPRAZolam 0.5 MG tablet Commonly known as: XANAX TAKE 1 TABLET(0.5 MG) BY MOUTH THREE TIMES DAILY AS NEEDED FOR ANXIETY   amoxicillin 500 MG capsule Commonly known as: AMOXIL Take 4 caps (2gm) by mouth once prior to dental procedure   azelastine 0.1 % nasal spray Commonly known as: ASTELIN Place 2 sprays into both nostrils 2 (two) times daily. Use in each nostril as directed   b complex vitamins tablet Take 1 tablet by mouth daily.   baclofen 10 MG tablet Commonly known as: LIORESAL Take 1/2 to 1 by mouth every 8hrs as needed for spasm   bethanechol 25 MG tablet Commonly known as: URECHOLINE TAKE 1 TABLET(25 MG) BY MOUTH THREE TIMES DAILY   Calcium 500 MG tablet Take 600 mg by mouth daily.   folic acid 1 MG tablet Commonly known as: FOLVITE TAKE 2 TABLETS(2 MG) BY MOUTH DAILY   HYDROcodone-acetaminophen 7.5-325 MG tablet Commonly known as: NORCO Take 1 tablet by mouth every 6 (six) hours as needed for moderate pain.   Magnesium Oxide 200 MG Tabs Take 200 mg by mouth daily.   Melatonin 10 MG Tabs Take 20 mg by mouth. Take 20 mg daily as needed at bedtime.   multivitamin tablet Take 1 tablet by  mouth daily.   OMEGA-3 FISH OIL PO Take 2 capsules by mouth daily. EPA/DHA 520/350   polyethylene glycol 17 g packet Commonly known as: MIRALAX / GLYCOLAX Take 17 g by mouth daily as needed for mild constipation.   predniSONE 20 MG tablet Commonly known as: DELTASONE May take 1 tablet daily for up to 3 days as needed for gout attack or muscle spasm   pregabalin 25 MG capsule Commonly known as: Lyrica Take 1 capsule (25 mg total) by mouth 3 (three) times daily.   UNABLE TO FIND Take 1 tablet by mouth daily. Med Name: B-6 VITAMIN   vitamin C 500 MG tablet Commonly known as: ASCORBIC ACID Take 500 mg by mouth daily.   Vitamin D3 50 MCG (2000 UT)  capsule Take 2,000 Units by mouth daily.   zinc gluconate 50 MG tablet Take 50 mg by mouth daily.       Allergies:  Allergies  Allergen Reactions  . Nsaids Other (See Comments) and Tinitus    Bruising   . Statins Nausea And Vomiting and Other (See Comments)    Other reaction(s): Kidney issues Muscle pain   . Tolmetin Other (See Comments)    Bruising  . Ciprofloxacin Other (See Comments)    Other reaction(s):Achilles tendon   . Ropinirole Anxiety    Mood swing  . Nitroglycerin   . Requip [Ropinirole Hcl] Anxiety    Past Medical History, Surgical history, Social history, and Family History were reviewed and updated.  Review of Systems: All other 10 point review of systems is negative.   Physical Exam:  weight is 211 lb 12.8 oz (96.1 kg). His oral temperature is 98 F (36.7 C). His blood pressure is 102/61 and his pulse is 87. His oxygen saturation is 97%.   Wt Readings from Last 3 Encounters:  09/17/19 211 lb 12.8 oz (96.1 kg)  08/17/19 208 lb (94.3 kg)  07/17/19 209 lb (94.8 kg)    Ocular: Sclerae unicteric, pupils equal, round and reactive to light Ear-nose-throat: Oropharynx clear, dentition fair Lymphatic: No cervical or supraclavicular adenopathy Lungs no rales or rhonchi, good excursion bilaterally Heart regular rate and rhythm, no murmur appreciated Abd soft, nontender, positive bowel sounds, no liver or spleen tip palpated on exam, no fluid wave  MSK no focal spinal tenderness, no joint edema Neuro: non-focal, well-oriented, appropriate affect Breasts: Deferred  Lab Results  Component Value Date   WBC 5.0 09/17/2019   HGB 8.6 (L) 09/17/2019   HCT 26.6 (L) 09/17/2019   MCV 117.2 (H) 09/17/2019   PLT 69 (L) 09/17/2019   Lab Results  Component Value Date   FERRITIN 420 (H) 08/17/2019   IRON 105 08/17/2019   TIBC 248 08/17/2019   UIBC 143 08/17/2019   IRONPCTSAT 42 08/17/2019   Lab Results  Component Value Date   RETICCTPCT 1.1 08/17/2019    RBC 2.27 (L) 09/17/2019   No results found for: KPAFRELGTCHN, LAMBDASER, KAPLAMBRATIO No results found for: IGGSERUM, IGA, IGMSERUM No results found for: Ronnald Ramp, A1GS, A2GS, Violet Baldy, MSPIKE, SPEI   Chemistry      Component Value Date/Time   NA 142 09/17/2019 1406   NA 139 05/08/2017 1123   NA 141 05/15/2016 1126   K 4.1 09/17/2019 1406   K 4.0 05/08/2017 1123   K 4.3 05/15/2016 1126   CL 105 09/17/2019 1406   CL 105 05/08/2017 1123   CO2 26 09/17/2019 1406   CO2 30 05/08/2017 1123   CO2 26  05/15/2016 1126   BUN 29 (H) 09/17/2019 1406   BUN 15 05/08/2017 1123   BUN 29.1 (H) 05/15/2016 1126   CREATININE 1.74 (H) 09/17/2019 1406   CREATININE 1.2 05/08/2017 1123   CREATININE 1.7 (H) 05/15/2016 1126   GLU 90 11/15/2014 0000      Component Value Date/Time   CALCIUM 9.3 09/17/2019 1406   CALCIUM 9.1 05/08/2017 1123   CALCIUM 9.7 05/15/2016 1126   ALKPHOS 57 09/17/2019 1406   ALKPHOS 64 05/08/2017 1123   ALKPHOS 69 05/15/2016 1126   AST 15 09/17/2019 1406   AST 23 05/15/2016 1126   ALT 11 09/17/2019 1406   ALT 19 05/08/2017 1123   ALT 22 05/15/2016 1126   BILITOT 1.0 09/17/2019 1406   BILITOT 0.84 05/15/2016 1126       Impression and Plan: JeremiahVincentis a 86yo caucasian gentleman withhistory of diffuse large cell non-Hodgkin's lymphomatreated with6 cycles of R-CHOP completed in April 2014.  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 He received Aranesp today for Hgb 8.6.  We will have him come every 3 weeks now for follow-up.  He will contact our office with any questions or concerns. We can certainly see him sooner if needed.   Laverna Peace, NP 3/25/20212:55 PM

## 2019-09-18 ENCOUNTER — Telehealth: Payer: Self-pay | Admitting: Hematology & Oncology

## 2019-09-18 LAB — IRON AND TIBC
Iron: 81 ug/dL (ref 42–163)
Saturation Ratios: 35 % (ref 20–55)
TIBC: 234 ug/dL (ref 202–409)
UIBC: 152 ug/dL (ref 117–376)

## 2019-09-18 LAB — FERRITIN: Ferritin: 401 ng/mL — ABNORMAL HIGH (ref 24–336)

## 2019-09-18 NOTE — Telephone Encounter (Signed)
I called and LMVM for patient regarding appointments that were added per 3/25 los

## 2019-09-21 ENCOUNTER — Encounter: Payer: Self-pay | Admitting: Family

## 2019-09-22 DIAGNOSIS — R293 Abnormal posture: Secondary | ICD-10-CM | POA: Diagnosis not present

## 2019-09-22 DIAGNOSIS — M5126 Other intervertebral disc displacement, lumbar region: Secondary | ICD-10-CM | POA: Diagnosis not present

## 2019-09-22 DIAGNOSIS — R2689 Other abnormalities of gait and mobility: Secondary | ICD-10-CM | POA: Diagnosis not present

## 2019-09-22 DIAGNOSIS — M545 Low back pain: Secondary | ICD-10-CM | POA: Diagnosis not present

## 2019-09-22 DIAGNOSIS — M6281 Muscle weakness (generalized): Secondary | ICD-10-CM | POA: Diagnosis not present

## 2019-09-24 ENCOUNTER — Telehealth: Payer: Self-pay | Admitting: *Deleted

## 2019-09-24 DIAGNOSIS — R2689 Other abnormalities of gait and mobility: Secondary | ICD-10-CM | POA: Diagnosis not present

## 2019-09-24 DIAGNOSIS — M545 Low back pain: Secondary | ICD-10-CM | POA: Diagnosis not present

## 2019-09-24 DIAGNOSIS — M5126 Other intervertebral disc displacement, lumbar region: Secondary | ICD-10-CM | POA: Diagnosis not present

## 2019-09-24 DIAGNOSIS — R293 Abnormal posture: Secondary | ICD-10-CM | POA: Diagnosis not present

## 2019-09-24 DIAGNOSIS — M6281 Muscle weakness (generalized): Secondary | ICD-10-CM | POA: Diagnosis not present

## 2019-09-24 NOTE — Telephone Encounter (Signed)
Call received from patient stating that he is "still not feeling well and that his O2 Sats are in the mid-high 80's"  Pt instructed to call the nurse at [facility] to have her evaluate him ASAP.  Pt states that he will call the nurse now.

## 2019-09-25 ENCOUNTER — Other Ambulatory Visit: Payer: Self-pay

## 2019-09-25 ENCOUNTER — Emergency Department (HOSPITAL_BASED_OUTPATIENT_CLINIC_OR_DEPARTMENT_OTHER): Payer: Medicare Other

## 2019-09-25 ENCOUNTER — Encounter (HOSPITAL_BASED_OUTPATIENT_CLINIC_OR_DEPARTMENT_OTHER): Payer: Self-pay | Admitting: Emergency Medicine

## 2019-09-25 ENCOUNTER — Other Ambulatory Visit: Payer: Medicare Other

## 2019-09-25 ENCOUNTER — Ambulatory Visit: Payer: Medicare Other

## 2019-09-25 ENCOUNTER — Inpatient Hospital Stay (HOSPITAL_BASED_OUTPATIENT_CLINIC_OR_DEPARTMENT_OTHER)
Admission: EM | Admit: 2019-09-25 | Discharge: 2019-09-27 | DRG: 291 | Disposition: A | Discharge status: Home / Self Care | Payer: Medicare Other | Attending: Internal Medicine | Admitting: Internal Medicine

## 2019-09-25 ENCOUNTER — Ambulatory Visit: Payer: Medicare Other | Admitting: Hematology & Oncology

## 2019-09-25 DIAGNOSIS — C9310 Chronic myelomonocytic leukemia not having achieved remission: Secondary | ICD-10-CM | POA: Diagnosis present

## 2019-09-25 DIAGNOSIS — Z888 Allergy status to other drugs, medicaments and biological substances status: Secondary | ICD-10-CM

## 2019-09-25 DIAGNOSIS — D696 Thrombocytopenia, unspecified: Secondary | ICD-10-CM | POA: Diagnosis present

## 2019-09-25 DIAGNOSIS — G4700 Insomnia, unspecified: Secondary | ICD-10-CM | POA: Diagnosis present

## 2019-09-25 DIAGNOSIS — M549 Dorsalgia, unspecified: Secondary | ICD-10-CM | POA: Diagnosis present

## 2019-09-25 DIAGNOSIS — Z79899 Other long term (current) drug therapy: Secondary | ICD-10-CM

## 2019-09-25 DIAGNOSIS — N1832 Chronic kidney disease, stage 3b: Secondary | ICD-10-CM | POA: Diagnosis present

## 2019-09-25 DIAGNOSIS — E8770 Fluid overload, unspecified: Secondary | ICD-10-CM | POA: Diagnosis not present

## 2019-09-25 DIAGNOSIS — Z66 Do not resuscitate: Secondary | ICD-10-CM | POA: Diagnosis present

## 2019-09-25 DIAGNOSIS — Z87891 Personal history of nicotine dependence: Secondary | ICD-10-CM

## 2019-09-25 DIAGNOSIS — I083 Combined rheumatic disorders of mitral, aortic and tricuspid valves: Secondary | ICD-10-CM | POA: Diagnosis present

## 2019-09-25 DIAGNOSIS — Z20822 Contact with and (suspected) exposure to covid-19: Secondary | ICD-10-CM | POA: Diagnosis present

## 2019-09-25 DIAGNOSIS — Z8249 Family history of ischemic heart disease and other diseases of the circulatory system: Secondary | ICD-10-CM

## 2019-09-25 DIAGNOSIS — N183 Chronic kidney disease, stage 3 unspecified: Secondary | ICD-10-CM | POA: Diagnosis present

## 2019-09-25 DIAGNOSIS — R778 Other specified abnormalities of plasma proteins: Secondary | ICD-10-CM

## 2019-09-25 DIAGNOSIS — I130 Hypertensive heart and chronic kidney disease with heart failure and stage 1 through stage 4 chronic kidney disease, or unspecified chronic kidney disease: Secondary | ICD-10-CM | POA: Diagnosis present

## 2019-09-25 DIAGNOSIS — I2510 Atherosclerotic heart disease of native coronary artery without angina pectoris: Secondary | ICD-10-CM | POA: Diagnosis present

## 2019-09-25 DIAGNOSIS — F329 Major depressive disorder, single episode, unspecified: Secondary | ICD-10-CM | POA: Diagnosis present

## 2019-09-25 DIAGNOSIS — I5033 Acute on chronic diastolic (congestive) heart failure: Secondary | ICD-10-CM | POA: Diagnosis present

## 2019-09-25 DIAGNOSIS — G629 Polyneuropathy, unspecified: Secondary | ICD-10-CM | POA: Diagnosis present

## 2019-09-25 DIAGNOSIS — K219 Gastro-esophageal reflux disease without esophagitis: Secondary | ICD-10-CM | POA: Diagnosis present

## 2019-09-25 DIAGNOSIS — Z886 Allergy status to analgesic agent status: Secondary | ICD-10-CM

## 2019-09-25 DIAGNOSIS — G894 Chronic pain syndrome: Secondary | ICD-10-CM | POA: Diagnosis present

## 2019-09-25 DIAGNOSIS — R0602 Shortness of breath: Secondary | ICD-10-CM

## 2019-09-25 DIAGNOSIS — R7989 Other specified abnormal findings of blood chemistry: Secondary | ICD-10-CM | POA: Diagnosis not present

## 2019-09-25 DIAGNOSIS — T451X5A Adverse effect of antineoplastic and immunosuppressive drugs, initial encounter: Secondary | ICD-10-CM | POA: Diagnosis present

## 2019-09-25 DIAGNOSIS — J9601 Acute respiratory failure with hypoxia: Secondary | ICD-10-CM

## 2019-09-25 DIAGNOSIS — M1990 Unspecified osteoarthritis, unspecified site: Secondary | ICD-10-CM | POA: Diagnosis present

## 2019-09-25 DIAGNOSIS — F411 Generalized anxiety disorder: Secondary | ICD-10-CM | POA: Diagnosis present

## 2019-09-25 DIAGNOSIS — D6481 Anemia due to antineoplastic chemotherapy: Secondary | ICD-10-CM | POA: Diagnosis present

## 2019-09-25 DIAGNOSIS — Z881 Allergy status to other antibiotic agents status: Secondary | ICD-10-CM

## 2019-09-25 DIAGNOSIS — I248 Other forms of acute ischemic heart disease: Secondary | ICD-10-CM | POA: Diagnosis present

## 2019-09-25 DIAGNOSIS — E785 Hyperlipidemia, unspecified: Secondary | ICD-10-CM | POA: Diagnosis present

## 2019-09-25 DIAGNOSIS — Z96643 Presence of artificial hip joint, bilateral: Secondary | ICD-10-CM | POA: Diagnosis present

## 2019-09-25 DIAGNOSIS — I509 Heart failure, unspecified: Secondary | ICD-10-CM

## 2019-09-25 HISTORY — DX: Heart failure, unspecified: I50.9

## 2019-09-25 HISTORY — DX: Disorder of kidney and ureter, unspecified: N28.9

## 2019-09-25 LAB — D-DIMER, QUANTITATIVE: D-Dimer, Quant: 1.58 ug/mL-FEU — ABNORMAL HIGH (ref 0.00–0.50)

## 2019-09-25 LAB — CBC WITH DIFFERENTIAL/PLATELET
Abs Immature Granulocytes: 0.1 10*3/uL — ABNORMAL HIGH (ref 0.00–0.07)
Basophils Absolute: 0 10*3/uL (ref 0.0–0.1)
Basophils Relative: 1 %
Eosinophils Absolute: 0 10*3/uL (ref 0.0–0.5)
Eosinophils Relative: 1 %
HCT: 27.2 % — ABNORMAL LOW (ref 39.0–52.0)
Hemoglobin: 8.8 g/dL — ABNORMAL LOW (ref 13.0–17.0)
Immature Granulocytes: 2 %
Lymphocytes Relative: 29 %
Lymphs Abs: 2 10*3/uL (ref 0.7–4.0)
MCH: 38.6 pg — ABNORMAL HIGH (ref 26.0–34.0)
MCHC: 32.4 g/dL (ref 30.0–36.0)
MCV: 119.3 fL — ABNORMAL HIGH (ref 80.0–100.0)
Monocytes Absolute: 2.3 10*3/uL — ABNORMAL HIGH (ref 0.1–1.0)
Monocytes Relative: 33 %
Neutro Abs: 2.3 10*3/uL (ref 1.7–7.7)
Neutrophils Relative %: 34 %
Platelets: 122 10*3/uL — ABNORMAL LOW (ref 150–400)
RBC: 2.28 MIL/uL — ABNORMAL LOW (ref 4.22–5.81)
RDW: 17.3 % — ABNORMAL HIGH (ref 11.5–15.5)
WBC: 6.8 10*3/uL (ref 4.0–10.5)
nRBC: 0.4 % — ABNORMAL HIGH (ref 0.0–0.2)

## 2019-09-25 LAB — HEPATIC FUNCTION PANEL
ALT: 14 U/L (ref 0–44)
AST: 21 U/L (ref 15–41)
Albumin: 3.8 g/dL (ref 3.5–5.0)
Alkaline Phosphatase: 51 U/L (ref 38–126)
Bilirubin, Direct: 0.2 mg/dL (ref 0.0–0.2)
Indirect Bilirubin: 1.2 mg/dL — ABNORMAL HIGH (ref 0.3–0.9)
Total Bilirubin: 1.4 mg/dL — ABNORMAL HIGH (ref 0.3–1.2)
Total Protein: 7 g/dL (ref 6.5–8.1)

## 2019-09-25 LAB — BRAIN NATRIURETIC PEPTIDE: B Natriuretic Peptide: 722 pg/mL — ABNORMAL HIGH (ref 0.0–100.0)

## 2019-09-25 LAB — TROPONIN I (HIGH SENSITIVITY)
Troponin I (High Sensitivity): 153 ng/L (ref <18)
Troponin I (High Sensitivity): 146 ng/L (ref <18)

## 2019-09-25 LAB — BASIC METABOLIC PANEL
Anion gap: 13 (ref 5–15)
BUN: 25 mg/dL — ABNORMAL HIGH (ref 8–23)
CO2: 24 mmol/L (ref 22–32)
Calcium: 9.3 mg/dL (ref 8.9–10.3)
Chloride: 104 mmol/L (ref 98–111)
Creatinine, Ser: 1.8 mg/dL — ABNORMAL HIGH (ref 0.61–1.24)
GFR calc Af Amer: 38 mL/min — ABNORMAL LOW (ref >60)
GFR calc non Af Amer: 33 mL/min — ABNORMAL LOW (ref >60)
Glucose, Bld: 123 mg/dL — ABNORMAL HIGH (ref 70–99)
Potassium: 3.7 mmol/L (ref 3.5–5.1)
Sodium: 141 mmol/L (ref 135–145)

## 2019-09-25 LAB — SARS CORONAVIRUS 2 (TAT 6-24 HRS): SARS Coronavirus 2: NEGATIVE

## 2019-09-25 LAB — SARS CORONAVIRUS 2 AG (30 MIN TAT): SARS Coronavirus 2 Ag: NEGATIVE

## 2019-09-25 MED ORDER — ACETAMINOPHEN 325 MG PO TABS: 650.0000 mg | ORAL_TABLET | ORAL | Status: DC | PRN

## 2019-09-25 MED ORDER — AZELASTINE HCL 0.1 % NA SOLN
2.0000 | Freq: Two times a day (BID) | NASAL | Status: DC | PRN
  Filled 2019-09-25: qty 30

## 2019-09-25 MED ORDER — SODIUM CHLORIDE 0.9 % IV SOLN: 250.0000 mL | INTRAVENOUS | Status: DC | PRN

## 2019-09-25 MED ORDER — SODIUM CHLORIDE 0.9% FLUSH: 3.0000 mL | INTRAVENOUS | Status: DC | PRN

## 2019-09-25 MED ORDER — MAGNESIUM OXIDE -MG SUPPLEMENT 200 MG PO TABS: 200.0000 mg | ORAL_TABLET | Freq: Every day | ORAL | Status: DC

## 2019-09-25 MED ORDER — BETHANECHOL CHLORIDE 25 MG PO TABS
25.0000 mg | ORAL_TABLET | Freq: Three times a day (TID) | ORAL | Status: DC
  Administered 2019-09-25 – 2019-09-27 (×5): 25 mg via ORAL
  Filled 2019-09-25 (×5): qty 1

## 2019-09-25 MED ORDER — POLYETHYLENE GLYCOL 3350 17 G PO PACK: 17.0000 g | PACK | Freq: Every day | ORAL | Status: DC | PRN

## 2019-09-25 MED ORDER — FUROSEMIDE 10 MG/ML IJ SOLN
40.0000 mg | Freq: Once | INTRAMUSCULAR | Status: AC
  Administered 2019-09-25: 11:00:00 40 mg via INTRAVENOUS
  Filled 2019-09-25: qty 4

## 2019-09-25 MED ORDER — ALLOPURINOL 100 MG PO TABS
200.0000 mg | ORAL_TABLET | Freq: Every day | ORAL | Status: DC
  Administered 2019-09-26 – 2019-09-27 (×2): 200 mg via ORAL
  Filled 2019-09-25 (×2): qty 2

## 2019-09-25 MED ORDER — IOHEXOL 350 MG/ML SOLN
100.0000 mL | Freq: Once | INTRAVENOUS | Status: AC | PRN
  Administered 2019-09-25: 10:00:00 80 mL via INTRAVENOUS

## 2019-09-25 MED ORDER — MAGNESIUM OXIDE 400 (241.3 MG) MG PO TABS
200.0000 mg | ORAL_TABLET | Freq: Every day | ORAL | Status: DC
  Administered 2019-09-25: 22:00:00 200 mg via ORAL
  Filled 2019-09-25: qty 1

## 2019-09-25 MED ORDER — SODIUM CHLORIDE 0.9% FLUSH
3.0000 mL | Freq: Two times a day (BID) | INTRAVENOUS | Status: DC
  Administered 2019-09-25 – 2019-09-27 (×4): 3 mL via INTRAVENOUS

## 2019-09-25 MED ORDER — MELATONIN 3 MG PO TABS
21.0000 mg | ORAL_TABLET | Freq: Every day | ORAL | Status: DC
  Administered 2019-09-25 – 2019-09-26 (×2): 21 mg via ORAL
  Filled 2019-09-25 (×2): qty 7

## 2019-09-25 MED ORDER — POLYETHYL GLYC-PROPYL GLYC PF 0.4-0.3 % OP SOLN: 1.0000 [drp] | Freq: Three times a day (TID) | OPHTHALMIC | Status: DC | PRN

## 2019-09-25 MED ORDER — POLYVINYL ALCOHOL 1.4 % OP SOLN
1.0000 [drp] | Freq: Three times a day (TID) | OPHTHALMIC | Status: DC | PRN
  Filled 2019-09-25: qty 15

## 2019-09-25 MED ORDER — ENOXAPARIN SODIUM 40 MG/0.4ML ~~LOC~~ SOLN
40.0000 mg | SUBCUTANEOUS | Status: DC
  Administered 2019-09-25 – 2019-09-26 (×2): 40 mg via SUBCUTANEOUS
  Filled 2019-09-25 (×2): qty 0.4

## 2019-09-25 MED ORDER — ONDANSETRON HCL 4 MG/2ML IJ SOLN: 4.0000 mg | Freq: Four times a day (QID) | INTRAMUSCULAR | Status: DC | PRN

## 2019-09-25 MED ORDER — FOLIC ACID 1 MG PO TABS
2.0000 mg | ORAL_TABLET | Freq: Every day | ORAL | Status: DC
  Administered 2019-09-25 – 2019-09-26 (×2): 2 mg via ORAL
  Filled 2019-09-25 (×2): qty 2

## 2019-09-25 MED ORDER — FUROSEMIDE 10 MG/ML IJ SOLN
40.0000 mg | Freq: Two times a day (BID) | INTRAMUSCULAR | Status: DC
  Administered 2019-09-25 – 2019-09-27 (×4): 40 mg via INTRAVENOUS
  Filled 2019-09-25 (×4): qty 4

## 2019-09-25 MED ORDER — PREGABALIN 25 MG PO CAPS
25.0000 mg | ORAL_CAPSULE | Freq: Three times a day (TID) | ORAL | Status: DC
  Administered 2019-09-25 – 2019-09-27 (×5): 25 mg via ORAL
  Filled 2019-09-25 (×5): qty 1

## 2019-09-25 MED ORDER — ALPRAZOLAM 0.5 MG PO TABS
1.0000 mg | ORAL_TABLET | Freq: Every day | ORAL | Status: DC
  Administered 2019-09-25 – 2019-09-26 (×2): 1 mg via ORAL
  Filled 2019-09-25 (×2): qty 2

## 2019-09-25 MED ORDER — HYDROCODONE-ACETAMINOPHEN 7.5-325 MG PO TABS
1.0000 | ORAL_TABLET | Freq: Four times a day (QID) | ORAL | Status: DC | PRN
  Administered 2019-09-25 – 2019-09-27 (×5): 1 via ORAL
  Filled 2019-09-25 (×5): qty 1

## 2019-09-25 MED ORDER — MELATONIN 10 MG PO TABS: 20.0000 mg | ORAL_TABLET | Freq: Every day | ORAL | Status: DC

## 2019-09-25 MED ORDER — BACLOFEN 5 MG HALF TABLET: 5.0000 mg | ORAL_TABLET | Freq: Three times a day (TID) | ORAL | Status: DC | PRN

## 2019-09-25 NOTE — Plan of Care (Signed)
Transfer from [facility] discussed with Dr. Ronnald Nian. Jeremiah Walker is a 86 year old male with pmh HTN, diastolic CHF last EF 62-03% 03/2019, CKD, CAD, CLL, and anemia due to chemo presents with complaints of SOB.  O2 saturations well dropped into the 80s with exertion and talking.  Labs significant for hemoglobin 8.8, platelets 122, BUN 25, creatinine 1.8, BNP 722, high-sensitivity troponin 153, and D-dimer 1.58.  CT angiogram of the chest was obtained due to elevated D-dimer, but showed CHF with moderate b/l pleural effusion and no visible left atrial appendage this could give concern for atrial fibrillation.  Patient was given 40 mg of Lasix IV for heart failure exacerbation.  Accepted to a cardiac telemetry bed here at [facility].  Accepted to a cardiac telemetry bed as inpatient.

## 2019-09-25 NOTE — ED Provider Notes (Signed)
[facility] EMERGENCY DEPARTMENT Provider Note   CSN: [id number] Arrival date & time: 09/25/19  5732     History Chief Complaint  Patient presents with  . Shortness of Breath    Jeremiah Walker is a 86 y.o. male.  The history is provided by the patient.  Shortness of Breath Severity:  Moderate Onset quality:  Gradual Duration:  1 week Timing:  Constant Progression:  Worsening Chronicity:  New Context: activity   Relieved by:  Rest Worsened by:  Activity Associated symptoms: no abdominal pain, no chest pain, no claudication, no cough, no diaphoresis, no ear pain, no fever, no headaches, no hemoptysis, no neck pain, no PND, no rash, no sore throat, no sputum production, no syncope, no swollen glands, no vomiting and no wheezing   Risk factors: hx of cancer (Lymphoma hx, now CMML and gets aranesp monthly)   Risk factors: no hx of PE/DVT        Past Medical History:  Diagnosis Date  . Anemia   . Anemia due to antineoplastic chemotherapy 08/15/2015  . Anxiety   . Arthritis   . Cataract   . Chronic kidney disease (CKD), stage III (moderate) 02/09/2015   Controlled  . CMML (chronic myelomonocytic leukemia) ([place])    gets Aranesp about monthly, otherwise surveillance with Dr. Marin Olp  . Combined fat and carbohydrate induced hyperlipemia 02/09/2015   Controlled  . Depression   . DLBCL (diffuse large B cell lymphoma) ([place]) 08/15/2015  . Essential (primary) hypertension 02/09/2015  . Gastro-esophageal reflux disease without esophagitis 02/09/2015   Controlled  . Renal insufficiency   . Tinnitus     Patient Active Problem List   Diagnosis Date Noted  . Spondylosis without myelopathy or radiculopathy, lumbar region 08/12/2019  . Spinal stenosis of lumbar region with neurogenic claudication 08/12/2019  . Myofascial pain syndrome 08/12/2019  . Chest pain 03/26/2019  . Chronic myelomonocytic leukemia not having achieved remission ([place]) 12/04/2016  . Chronic pain  syndrome 09/26/2015  . De Quervain's tenosynovitis, right 09/13/2015  . Anemia due to antineoplastic chemotherapy 08/15/2015  . DLBCL (diffuse large B cell lymphoma) ([place]) 08/15/2015  . Skin lesion of face 05/21/2015  . Peripheral neuropathy due to chemotherapy ([place]) 02/09/2015  . Benign prostatic hyperplasia with urinary obstruction 02/09/2015  . Bilateral hearing loss 02/09/2015  . Low back pain 02/09/2015  . Chronic kidney disease (CKD), stage III (moderate) 02/09/2015  . Essential (primary) hypertension 02/09/2015  . Gastro-esophageal reflux disease without esophagitis 02/09/2015  . Anxiety, generalized 02/09/2015  . Cannot sleep 02/09/2015  . Combined fat and carbohydrate induced hyperlipemia 02/09/2015    Past Surgical History:  Procedure Laterality Date  . CATARACT EXTRACTION, BILATERAL    . COLONOSCOPY  May 2011  . CYSTOSCOPY    . TOTAL HIP ARTHROPLASTY  2003   Right   . TOTAL HIP ARTHROPLASTY  April, 2011   Left  . TRIGGER FINGER RELEASE  Nov 2013  . TURBINECTOMY  2006  . UPPER GI ENDOSCOPY  Nov 2015       Family History  Problem Relation Age of Onset  . Anuerysm Mother 34       Deceased  . Colon cancer Father 95       Deceased  . Colon cancer Brother   . Prostate cancer Brother        Deceased  . Alzheimer's disease Brother   . Heart disease Brother   . Alzheimer's disease Paternal Grandmother   . Early death Maternal Grandmother  Unknown  . Obesity Son   . Obesity Son        Gastric Bypass  . Healthy Son   . Healthy Daughter   . Diabetes Neg Hx     Social History   Tobacco Use  . Smoking status: Former Research scientist (life sciences)  . Smokeless tobacco: Never Used  . Tobacco comment: Quit >50 years ago  Substance Use Topics  . Alcohol use: Yes    Alcohol/week: 1.0 - 4.0 standard drinks    Types: 1 - 4 Shots of liquor per week    Comment: 4 glasses per week  . Drug use: No    Home Medications Prior to Admission medications   Medication Sig Start Date  End Date Taking? Authorizing Provider  allopurinol (ZYLOPRIM) 100 MG tablet TAKE 2 TABLETS(200 MG) BY MOUTH DAILY 05/19/19   Copland, Gay Filler, MD  ALPRAZolam (XANAX) 0.5 MG tablet TAKE 1 TABLET(0.5 MG) BY MOUTH THREE TIMES DAILY AS NEEDED FOR ANXIETY 07/21/19   Copland, Gay Filler, MD  amoxicillin (AMOXIL) 500 MG capsule Take 4 caps (2gm) by mouth once prior to dental procedure 08/06/19   Copland, Gay Filler, MD  azelastine (ASTELIN) 0.1 % nasal spray Place 2 sprays into both nostrils 2 (two) times daily. Use in each nostril as directed 03/24/18   Copland, Gay Filler, MD  b complex vitamins tablet Take 1 tablet by mouth daily.    [provider]  baclofen (LIORESAL) 10 MG tablet Take 1/2 to 1 by mouth every 8hrs as needed for spasm 08/04/19   Magnus Sinning, MD  bethanechol (URECHOLINE) 25 MG tablet TAKE 1 TABLET(25 MG) BY MOUTH THREE TIMES DAILY 07/14/19   Copland, Gay Filler, MD  Calcium 500 MG tablet Take 600 mg by mouth daily.    [provider]  Cholecalciferol (VITAMIN D3) 2000 UNITS capsule Take 2,000 Units by mouth daily.    [provider]  folic acid (FOLVITE) 1 MG tablet TAKE 2 TABLETS(2 MG) BY MOUTH DAILY 07/29/19   [name], MD  HYDROcodone-acetaminophen (NORCO) 7.5-325 MG tablet Take 1 tablet by mouth every 6 (six) hours as needed for moderate pain. 09/04/19   Copland, Gay Filler, MD  Magnesium Oxide 200 MG TABS Take 200 mg by mouth daily.  05/02/11   [provider]  Melatonin 10 MG TABS Take 20 mg by mouth. Take 20 mg daily as needed at bedtime.    [provider]  Multiple Vitamin (MULTIVITAMIN) tablet Take 1 tablet by mouth daily.    [provider]  Omega-3 Fatty Acids (OMEGA-3 FISH OIL PO) Take 2 capsules by mouth daily. EPA/DHA 520/350     [provider]  polyethylene glycol (MIRALAX / GLYCOLAX) packet Take 17 g by mouth daily as needed for mild constipation.     [provider]  predniSONE (DELTASONE) 20 MG  tablet May take 1 tablet daily for up to 3 days as needed for gout attack or muscle spasm 01/30/19   Copland, Gay Filler, MD  pregabalin (LYRICA) 25 MG capsule Take 1 capsule (25 mg total) by mouth 3 (three) times daily. 08/10/19   Copland, Gay Filler, MD  UNABLE TO FIND Take 1 tablet by mouth daily. Med Name: B-6 VITAMIN     [provider]  vitamin C (ASCORBIC ACID) 500 MG tablet Take 500 mg by mouth daily.    [provider]  zinc gluconate 50 MG tablet Take 50 mg by mouth daily.    [provider]  Allergies    Nsaids, Statins, Tolmetin, Ciprofloxacin, Ropinirole, Nitroglycerin, and Requip [ropinirole hcl]  Review of Systems   Review of Systems  Constitutional: Negative for chills, diaphoresis and fever.  HENT: Negative for ear pain and sore throat.   Eyes: Negative for pain and visual disturbance.  Respiratory: Positive for shortness of breath. Negative for cough, hemoptysis, sputum production and wheezing.   Cardiovascular: Negative for chest pain, palpitations, claudication, syncope and PND.  Gastrointestinal: Negative for abdominal pain and vomiting.  Genitourinary: Negative for dysuria and hematuria.  Musculoskeletal: Negative for arthralgias, back pain and neck pain.  Skin: Negative for color change and rash.  Neurological: Negative for seizures, syncope and headaches.  All other systems reviewed and are negative.   Physical Exam  ED Triage Vitals  Enc Vitals Group     BP 09/25/19 0847 117/61     Pulse Rate 09/25/19 0847 92     Resp 09/25/19 0847 16     Temp 09/25/19 0847 98.2 F (36.8 C)     Temp Source 09/25/19 0847 Oral     SpO2 09/25/19 0847 93 %     Weight 09/25/19 0848 204 lb (92.5 kg)     Height 09/25/19 0848 5' 9.5" (1.765 m)     Head Circumference --      Peak Flow --      Pain Score 09/25/19 0848 0     Pain Loc --      Pain Edu? --      Excl. in [place]? --     Physical Exam Vitals and nursing note reviewed.  Constitutional:       General: He is not in acute distress.    Appearance: He is well-developed. He is not ill-appearing.  HENT:     Head: Normocephalic and atraumatic.     Mouth/Throat:     Pharynx: No pharyngeal swelling or oropharyngeal exudate.  Eyes:     Extraocular Movements: Extraocular movements intact.     Conjunctiva/sclera: Conjunctivae normal.     Pupils: Pupils are equal, round, and reactive to light.  Cardiovascular:     Rate and Rhythm: Normal rate and regular rhythm.     Pulses: Normal pulses.     Heart sounds: Murmur present.  Pulmonary:     Effort: Pulmonary effort is normal. No respiratory distress.     Breath sounds: Rales (coarse) present. No decreased breath sounds, wheezing or rhonchi.  Abdominal:     Palpations: Abdomen is soft.     Tenderness: There is no abdominal tenderness.  Musculoskeletal:     Cervical back: Normal range of motion and neck supple.     Right lower leg: No edema.     Left lower leg: No edema.  Skin:    General: Skin is warm and dry.     Capillary Refill: Capillary refill takes less than 2 seconds.  Neurological:     General: No focal deficit present.     Mental Status: He is alert.  Psychiatric:        Mood and Affect: Mood normal.     ED Results / Procedures / Treatments   Labs (all labs ordered are listed, but only abnormal results are displayed) Labs Reviewed  CBC WITH DIFFERENTIAL/PLATELET - Abnormal; Notable for the following components:      Result Value   RBC 2.28 (*)    Hemoglobin 8.8 (*)    HCT 27.2 (*)    MCV 119.3 (*)    MCH 38.6 (*)  RDW 17.3 (*)    Platelets 122 (*)    nRBC 0.4 (*)    Monocytes Absolute 2.3 (*)    Abs Immature Granulocytes 0.10 (*)    All other components within normal limits  BASIC METABOLIC PANEL - Abnormal; Notable for the following components:   Glucose, Bld 123 (*)    BUN 25 (*)    Creatinine, Ser 1.80 (*)    GFR calc non Af Amer 33 (*)    GFR calc Af Amer 38 (*)    All other components within normal  limits  HEPATIC FUNCTION PANEL - Abnormal; Notable for the following components:   Total Bilirubin 1.4 (*)    Indirect Bilirubin 1.2 (*)    All other components within normal limits  BRAIN NATRIURETIC PEPTIDE - Abnormal; Notable for the following components:   B Natriuretic Peptide 722.0 (*)    All other components within normal limits  D-DIMER, QUANTITATIVE (NOT AT [facility]) - Abnormal; Notable for the following components:   D-Dimer, Quant 1.58 (*)    All other components within normal limits  TROPONIN I (HIGH SENSITIVITY) - Abnormal; Notable for the following components:   Troponin I (High Sensitivity) 153 (*)    All other components within normal limits  SARS CORONAVIRUS 2 AG (30 MIN TAT)  TROPONIN I (HIGH SENSITIVITY)    EKG  EKG shows normal sinus rhythm.  No ischemic changes, intervals unremarkable.  EKG would not transfer over from MUSE system.  Radiology CT Angio Chest PE W and/or Wo Contrast  Result Date: 09/25/2019 CLINICAL DATA:  Shortness of breath EXAM: CT ANGIOGRAPHY CHEST WITH CONTRAST TECHNIQUE: Multidetector CT imaging of the chest was performed using the standard protocol during bolus administration of intravenous contrast. Multiplanar CT image reconstructions and MIPs were obtained to evaluate the vascular anatomy. CONTRAST:  13mL OMNIPAQUE IOHEXOL 350 MG/ML SOLN COMPARISON:  03/26/2019 FINDINGS: Cardiovascular: Cardiomegaly. Relatively decreased density of the left atrial appendage. Extensive atherosclerotic plaque on the aorta and coronaries. Aortic valve calcification. No acute aortic finding. No pulmonary artery filling defect Mediastinum/Nodes: History of lymphoma.  No adenopathy or mass. Lungs/Pleura: Moderate layering pleural effusions. Interlobular septal and fissure thickening. No consolidation. The central airways are clear with changes of bronchomalacia Upper Abdomen: Renal cystic densities including a thin benign-appearing calcification of a partially covered  left-sided cyst. Atherosclerosis Musculoskeletal: Spondylosis and mild scoliosis.  No acute finding. Review of the MIP images confirms the above findings. IMPRESSION: 1. CHF including moderate bilateral layering pleural effusion. 2. No visible enhancement within the left atrial appendage which could be delayed filling or thrombus. Please correlate for atrial fibrillation. 3. Negative for pulmonary embolism. 4. Aortic valve calcification. Electronically Signed   By: Monte Fantasia M.D.   On: 09/25/2019 10:31   DG Chest Portable 1 View  Result Date: 09/25/2019 CLINICAL DATA:  Shortness of breath. EXAM: PORTABLE CHEST 1 VIEW COMPARISON:  03/26/2019. FINDINGS: Cardiomegaly. Mild bilateral interstitial prominence. Mild CHF cannot be excluded. Pneumonitis can not be excluded. Small left pleural effusion. No pneumothorax. IMPRESSION: Cardiomegaly with mild bilateral interstitial prominence with small left pleural effusion. Findings suggest mild CHF. Electronically Signed   By: Marcello Moores  Register   On: 09/25/2019 09:35    Procedures .Critical Care Performed by: Lennice Sites, DO Authorized by: Lennice Sites, DO   Critical care provider statement:    Critical care time (minutes):  35   Critical care was necessary to treat or prevent imminent or life-threatening deterioration of the following conditions:  Respiratory failure  and cardiac failure   Critical care was time spent personally by me on the following activities:  Development of treatment plan with patient or surrogate, blood draw for specimens, discussions with primary provider, evaluation of patient's response to treatment, examination of patient, obtaining history from patient or surrogate, ordering and performing treatments and interventions, ordering and review of laboratory studies, ordering and review of radiographic studies, pulse oximetry, re-evaluation of patient's condition and review of old charts   I assumed direction of critical care for  this patient from another provider in my specialty: no     (including critical care time)  Medications Ordered in ED Medications  iohexol (OMNIPAQUE) 350 MG/ML injection 100 mL (80 mLs Intravenous Contrast Given 09/25/19 1011)    ED Course  I have reviewed the triage vital signs and the nursing notes.  Pertinent labs & imaging results that were available during my care of the patient were reviewed by me and considered in my medical decision making (see chart for details).    MDM Rules/Calculators/A&P                      Jeremiah Walker is an 86 year old male with history of lymphoma now with CMML on maintenance Aranesp, CKD who presents to the ED with shortness of breath.  Patient with overall unremarkable vitals.  Oxygenation appears to be between 90 and 95% on room air.  States that he has had progressive shortness of breath over the past week that is worse with activity.  No chest pain, no cough.  He was told that he might of had a fever yesterday but when he has checked he has not had a fever.  He denies any history of heart failure.  No leg swelling.  States that blood work with oncology last week revealed a hemoglobin 8.6.  Hemoglobin baseline is usually somewhere in the 9 range.  He does take Aranesp for this monthly but now decision has been made to do this every 3 weeks.  Patient does have a murmur on exam, did have an echocardiogram last year that showed some diastolic heart failure but overall unremarkable EF.  Does not take a fluid pill.  Overall has clear breath sounds on exam but could be faint rales.  No history of PE.  EKG shows sinus rhythm.  No ischemic changes.  Will evaluate with troponin, BNP, D-dimer to evaluate for possible ACS, PE, heart failure.  Will get chest x-ray and lab work to evaluate for possible infectious process.  Will evaluate hemoglobin to evaluate for any more evidence of worsening anemia.  Patient states that he checks his stools daily and they have been  brown.  No blood, no black stools.  Patient with no significant anemia, electrolyte abnormality.  Creatinine at baseline.  Covid test negative.  Will send confirmatory coronavirus test.  Troponin is elevated at 153, BNP elevated at 722, D-dimer elevated at 1.58.  Chest x-ray seems consistent with volume overload.  CT scan of the chest was performed that showed likely heart failure changes.  Patient does not take fluid medication, has no true diagnosis of heart failure.  Of note CT read also states that there is no visible enhancement within the left atrial appendage which could be delayed filling or thrombus.  However patient EKG is not consistent with atrial fibrillation.  There is no PE.  Overall believe echocardiogram will help further evaluate for this.  Clinically he is stable.  Oxygenation has been between  88 and 92% on room air.  Will give IV Lasix and have him admitted for further work-up.  This chart was dictated using voice recognition software.  Despite best efforts to proofread,  errors can occur which can change the documentation meaning.    Final Clinical Impression(s) / ED Diagnoses Final diagnoses:  SOB (shortness of breath)  Hypervolemia, unspecified hypervolemia type  Acute respiratory failure with hypoxia (HCC)  Elevated troponin    Rx / DC Orders ED Discharge Orders    None       Lennice Sites, DO 09/25/19 1051

## 2019-09-25 NOTE — ED Notes (Signed)
Date and time results received: 09/25/19 0955 (use smartphrase ".now" to insert current time)  Test: Trop Critical Value: 153  Name of Provider Notified:[place]  Acknowledged receipt of result

## 2019-09-25 NOTE — ED Triage Notes (Signed)
Unusual sleepiness for the past week and SOB with exertion. Temp 100.4 yesterday.  Nurse at [facility] checked him yesterday and O2 Sat was 82%.  She said he was "crackly".  She recommended coming to ED for eval.

## 2019-09-25 NOTE — ED Notes (Signed)
ED Provider at bedside. 

## 2019-09-25 NOTE — ED Notes (Signed)
Ambulated in room, SpO2 drop to 91% on r/a with +DOE, BBS coarse crackles  bilat bases.

## 2019-09-25 NOTE — ED Notes (Signed)
Report provided to [facility] for transport.  ETA [id number]

## 2019-09-25 NOTE — ED Notes (Signed)
Date and time results received: 09/25/19 1226  Test: Trop Critical Value: 146  Name of Provider Notified: Dr. Ronnald Nian  Orders Received? No

## 2019-09-25 NOTE — H&P (Signed)
History and Physical    Jeremiah Walker MRN:[id number] DOB: 02/18/1928 DOA: 09/25/2019  Referring MD/NP/PA: Adam Curatolo, MD PCP: Copland, Jessica C, MD  Patient coming from: Transfer from [facility]  Chief Complaint: Shortness of breath  I have personally briefly reviewed patient's old medical records in [facility]   HPI: Jeremiah Walker is a 86 y.o. 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  He noticed that he was more of breath whenever exerting himself.  Symptoms usually resolved with rest.  Denies having any leg swelling.  However, noted that it was harder for him to close his pants and his belt had to be let out to the last hole.  Associated symptoms included orthopnea.  He chronically suffers from neuropathy along with joint and back pain.  Denied having any complaints of chest pain, cough, nausea, or vomiting.  He reports that he chronically has low hemoglobin for which he is on Aranesp injections by Dr. Ennever.  Patient's cardiologist is Dr. Scheuman.  ED Course: O2 saturations well dropped into the 80s with exertion and talking.  Labs significant for hemoglobin 8.8, platelets 122, BUN 25, creatinine 1.8, BNP 722, high-sensitivity troponin 153, and D-dimer 1.58.  CT angiogram of the chest was obtained due to elevated D-dimer, but showed CHF with moderate b/l pleural effusion and no visible left atrial appendage this could give concern for atrial fibrillation.  Patient was given 40 mg of Lasix IV for heart failure exacerbation.     Review of Systems  Constitutional: Negative for fever.  HENT: Negative for congestion and ear pain.   Eyes: Negative for photophobia and discharge.  Respiratory: Positive for shortness of breath. Negative for cough.   Cardiovascular: Negative for chest pain and leg  swelling.  Gastrointestinal: Negative for abdominal pain.  Genitourinary: Negative for dysuria and frequency.  Musculoskeletal: Positive for back pain and joint pain.  Skin: Negative for rash.  Neurological: Positive for sensory change. Negative for focal weakness and loss of consciousness.  Psychiatric/Behavioral: Negative for hallucinations and substance abuse.    Past Medical History:  Diagnosis Date  . Acute exacerbation of CHF (congestive heart failure) (HCC) 09/25/2019  . Anemia   . Anemia due to antineoplastic chemotherapy 08/15/2015  . Anxiety   . Arthritis   . Cataract   . Chronic kidney disease (CKD), stage III (moderate) 02/09/2015   Controlled  . CMML (chronic myelomonocytic leukemia) (HCC)    gets Aranesp about monthly, otherwise surveillance with Dr. Ennever  . Combined fat and carbohydrate induced hyperlipemia 02/09/2015   Controlled  . Depression   . DLBCL (diffuse large B cell lymphoma) (HCC) 08/15/2015  . Essential (primary) hypertension 02/09/2015  . Gastro-esophageal reflux disease without esophagitis 02/09/2015   Controlled  . Renal insufficiency   . Tinnitus     Past Surgical History:  Procedure Laterality Date  . CATARACT EXTRACTION, BILATERAL    . COLONOSCOPY  May 2011  . CYSTOSCOPY    . TOTAL HIP ARTHROPLASTY  2003   Right   . TOTAL HIP ARTHROPLASTY  April, 2011   Left  . TRIGGER FINGER RELEASE  Nov 2013  . TURBINECTOMY  2006  . UPPER GI ENDOSCOPY  Nov 2015     reports that he has quit smoking. He has never used smokeless tobacco. He reports current alcohol use   of about 1.0 - 4.0 standard drinks of alcohol per week. He reports that he does not use drugs.  Allergies  Allergen Reactions  . Nsaids Other (See Comments) and Tinitus    Bruising   . Statins Nausea And Vomiting and Other (See Comments)    Other reaction(s): Kidney issues Muscle pain   . Tolmetin Other (See Comments)    Bruising  . Ciprofloxacin Other (See Comments)    Other  reaction(s):Achilles tendon   . Ropinirole Anxiety    Mood swing  . Nitroglycerin   . Requip [Ropinirole Hcl] Anxiety    Family History  Problem Relation Age of Onset  . Anuerysm Mother 85       Deceased  . Colon cancer Father 73       Deceased  . Colon cancer Brother   . Prostate cancer Brother        Deceased  . Alzheimer's disease Brother   . Heart disease Brother   . Alzheimer's disease Paternal Grandmother   . Early death Maternal Grandmother        Unknown  . Obesity Son   . Obesity Son        Gastric Bypass  . Healthy Son   . Healthy Daughter   . Diabetes Neg Hx     Prior to Admission medications   Medication Sig Start Date End Date Taking? Authorizing Provider  allopurinol (ZYLOPRIM) 100 MG tablet TAKE 2 TABLETS(200 MG) BY MOUTH DAILY 05/19/19   Copland, Jessica C, MD  ALPRAZolam (XANAX) 0.5 MG tablet TAKE 1 TABLET(0.5 MG) BY MOUTH THREE TIMES DAILY AS NEEDED FOR ANXIETY 07/21/19   Copland, Jessica C, MD  amoxicillin (AMOXIL) 500 MG capsule Take 4 caps (2gm) by mouth once prior to dental procedure 08/06/19   Copland, Jessica C, MD  azelastine (ASTELIN) 0.1 % nasal spray Place 2 sprays into both nostrils 2 (two) times daily. Use in each nostril as directed 03/24/18   Copland, Jessica C, MD  b complex vitamins tablet Take 1 tablet by mouth daily.    [provider]  baclofen (LIORESAL) 10 MG tablet Take 1/2 to 1 by mouth every 8hrs as needed for spasm 08/04/19   Newton, Frederic, MD  bethanechol (URECHOLINE) 25 MG tablet TAKE 1 TABLET(25 MG) BY MOUTH THREE TIMES DAILY 07/14/19   Copland, Jessica C, MD  Calcium 500 MG tablet Take 600 mg by mouth daily.    [provider]  Cholecalciferol (VITAMIN D3) 2000 UNITS capsule Take 2,000 Units by mouth daily.    [provider]  folic acid (FOLVITE) 1 MG tablet TAKE 2 TABLETS(2 MG) BY MOUTH DAILY 07/29/19   Ennever, Peter R, MD  HYDROcodone-acetaminophen (NORCO) 7.5-325 MG tablet Take 1 tablet by mouth every  6 (six) hours as needed for moderate pain. 09/04/19   Copland, Jessica C, MD  Magnesium Oxide 200 MG TABS Take 200 mg by mouth daily.  05/02/11   [provider]  Melatonin 10 MG TABS Take 20 mg by mouth. Take 20 mg daily as needed at bedtime.    [provider]  Multiple Vitamin (MULTIVITAMIN) tablet Take 1 tablet by mouth daily.    [provider]  Omega-3 Fatty Acids (OMEGA-3 FISH OIL PO) Take 2 capsules by mouth daily. EPA/DHA 520/350     [provider]  polyethylene glycol (MIRALAX / GLYCOLAX) packet Take 17 g by mouth daily as needed for mild constipation.     [provider]  predniSONE (DELTASONE) 20   MG tablet May take 1 tablet daily for up to 3 days as needed for gout attack or muscle spasm 01/30/19   Copland, Jessica C, MD  pregabalin (LYRICA) 25 MG capsule Take 1 capsule (25 mg total) by mouth 3 (three) times daily. 08/10/19   Copland, Jessica C, MD  UNABLE TO FIND Take 1 tablet by mouth daily. Med Name: B-6 VITAMIN     [provider]  vitamin C (ASCORBIC ACID) 500 MG tablet Take 500 mg by mouth daily.    [provider]  zinc gluconate 50 MG tablet Take 50 mg by mouth daily.    [provider]    Physical Exam:  Constitutional: Elderly male who does not appear to be in any acute distress Vitals:   09/25/19 1130 09/25/19 1200 09/25/19 1400 09/25/19 1538  BP: 112/61 129/67 140/67 133/67  Pulse: 92 79 91 93  Resp: (!) 23 (!) 25 20 18  Temp:    98 F (36.7 C)  TempSrc:    Oral  SpO2: 92% 94% 94% 95%  Weight:    91.6 kg  Height:    5' 9.5" (1.765 m)   Eyes: PERRL, lids and conjunctivae normal ENMT: Mucous membranes are moist. Posterior pharynx clear of any exudate or lesions.  Neck: normal, supple, no masses, no thyromegaly Respiratory: Normal respiratory effort without wheezing or rhonchi appreciated..  Patient maintaining O2 saturation on room air. Cardiovascular: Regular rate and rhythm, no murmurs / rubs /  gallops. No lower extremity edema. 2+ pedal pulses. No carotid bruits.  Abdomen: no tenderness, no masses palpated. No hepatosplenomegaly. Bowel sounds positive.  Musculoskeletal: no clubbing / cyanosis. No joint deformity upper and lower extremities. Good ROM, no contractures. Normal muscle tone.  Skin: no rashes, lesions, ulcers. No induration Neurologic: CN 2-12 grossly intact. Sensation intact, DTR normal. Strength 5/5 in all 4.  Psychiatric: Normal judgment and insight. Alert and oriented x 3. Normal mood.     Labs on Admission: I have personally reviewed following labs and imaging studies  CBC: Recent Labs  Lab 09/25/19 0854  WBC 6.8  NEUTROABS 2.3  HGB 8.8*  HCT 27.2*  MCV 119.3*  PLT 122*   Basic Metabolic Panel: Recent Labs  Lab 09/25/19 0854  NA 141  K 3.7  CL 104  CO2 24  GLUCOSE 123*  BUN 25*  CREATININE 1.80*  CALCIUM 9.3   GFR: Estimated Creatinine Clearance: 31.4 mL/min (A) (by C-G formula based on SCr of 1.8 mg/dL (H)). Liver Function Tests: Recent Labs  Lab 09/25/19 0854  AST 21  ALT 14  ALKPHOS 51  BILITOT 1.4*  PROT 7.0  ALBUMIN 3.8   No results for input(s): LIPASE, AMYLASE in the last 168 hours. No results for input(s): AMMONIA in the last 168 hours. Coagulation Profile: No results for input(s): INR, PROTIME in the last 168 hours. Cardiac Enzymes: No results for input(s): CKTOTAL, CKMB, CKMBINDEX, TROPONINI in the last 168 hours. BNP (last 3 results) No results for input(s): PROBNP in the last 8760 hours. HbA1C: No results for input(s): HGBA1C in the last 72 hours. CBG: No results for input(s): GLUCAP in the last 168 hours. Lipid Profile: No results for input(s): CHOL, HDL, LDLCALC, TRIG, CHOLHDL, LDLDIRECT in the last 72 hours. Thyroid Function Tests: No results for input(s): TSH, T4TOTAL, FREET4, T3FREE, THYROIDAB in the last 72 hours. Anemia Panel: No results for input(s): VITAMINB12, FOLATE, FERRITIN, TIBC, IRON, RETICCTPCT in  the last 72 hours. Urine analysis:      Component Value Date/Time   BILIRUBINUR negative 12/11/2017 1507   KETONESUR negative 12/11/2017 1507   PROTEINUR trace (A) 12/11/2017 1507   UROBILINOGEN 1.0 12/11/2017 1507   NITRITE Negative 12/11/2017 1507   LEUKOCYTESUR Negative 12/11/2017 1507   Sepsis Labs: Recent Results (from the past 240 hour(s))  SARS Coronavirus 2 Ag (30 min TAT) - Nasal Swab (BD Veritor Kit)     Status: None   Collection Time: 09/25/19  9:20 AM   Specimen: Nasal Swab (BD Veritor Kit)  Result Value Ref Range Status   SARS Coronavirus 2 Ag NEGATIVE NEGATIVE Final    Comment: (NOTE) SARS-CoV-2 antigen NOT DETECTED.  Negative results are presumptive.  Negative results do not preclude SARS-CoV-2 infection and should not be used as the sole basis for treatment or other patient management decisions, including infection  control decisions, particularly in the presence of clinical signs and  symptoms consistent with COVID-19, or in those who have been in contact with the virus.  Negative results must be combined with clinical observations, patient history, and epidemiological information. The expected result is Negative. Fact Sheet for Patients: PodPark.tn Fact Sheet for Healthcare Providers: GiftContent.is This test is not yet approved or cleared by the [place] FDA and  has been authorized for detection and/or diagnosis of SARS-CoV-2 by FDA under an Emergency Use Authorization (EUA).  This EUA will remain in effect (meaning this test can be used) for the duration of  the COVID-19 de claration under Section 564(b)(1) of the Act, 21 U.S.C. section 360bbb-3(b)(1), unless the authorization is terminated or revoked sooner. Performed at [facility], [address] [place], [place] [address]      Radiological Exams on Admission: CT Angio Chest PE W and/or Wo Contrast  Result Date:  09/25/2019 CLINICAL DATA:  Shortness of breath EXAM: CT ANGIOGRAPHY CHEST WITH CONTRAST TECHNIQUE: Multidetector CT imaging of the chest was performed using the standard protocol during bolus administration of intravenous contrast. Multiplanar CT image reconstructions and MIPs were obtained to evaluate the vascular anatomy. CONTRAST:  96m OMNIPAQUE IOHEXOL 350 MG/ML SOLN COMPARISON:  03/26/2019 FINDINGS: Cardiovascular: Cardiomegaly. Relatively decreased density of the left atrial appendage. Extensive atherosclerotic plaque on the aorta and coronaries. Aortic valve calcification. No acute aortic finding. No pulmonary artery filling defect Mediastinum/Nodes: History of lymphoma.  No adenopathy or mass. Lungs/Pleura: Moderate layering pleural effusions. Interlobular septal and fissure thickening. No consolidation. The central airways are clear with changes of bronchomalacia Upper Abdomen: Renal cystic densities including a thin benign-appearing calcification of a partially covered left-sided cyst. Atherosclerosis Musculoskeletal: Spondylosis and mild scoliosis.  No acute finding. Review of the MIP images confirms the above findings. IMPRESSION: 1. CHF including moderate bilateral layering pleural effusion. 2. No visible enhancement within the left atrial appendage which could be delayed filling or thrombus. Please correlate for atrial fibrillation. 3. Negative for pulmonary embolism. 4. Aortic valve calcification. Electronically Signed   By: JMonte FantasiaM.D.   On: 09/25/2019 10:31   DG Chest Portable 1 View  Result Date: 09/25/2019 CLINICAL DATA:  Shortness of breath. EXAM: PORTABLE CHEST 1 VIEW COMPARISON:  03/26/2019. FINDINGS: Cardiomegaly. Mild bilateral interstitial prominence. Mild CHF cannot be excluded. Pneumonitis can not be excluded. Small left pleural effusion. No pneumothorax. IMPRESSION: Cardiomegaly with mild bilateral interstitial prominence with small left pleural effusion. Findings suggest mild  CHF. Electronically Signed   By: TMarcello Moores Register   On: 09/25/2019 09:35    EKG: Independently reviewed.  Sinus rhythm 89 bpm  Assessment/Plan Acute exacerbation of CHF (congestive heart failure): Acute.  Patient presents with complaints of shortness of breath and abdominal distention.  Found to have elevated BNP of 722.  Imaging studies revealed cardiomegaly with bilateral effusions. -Admit to a telemetry bed -Heart failure orders set  initiated  -Continuous pulse oximetry with nasal cannula oxygen as needed to keep O2 saturations >92% -Strict I&Os and daily weights -Elevate lower extremities -Lasix 40 mg IV Bid -Reassess in a.m. and adjust diuresis as needed. -Check echocardiogram -ACE-I not use due to renal insufficiency -Will need to formally consult cardiology in a.m. -Follow-up telemetry overnight  Elevated troponin: Acute.  High-sensitivity troponin  153->143.  Patient denies any reports of any chest pain.  Suspect this is secondary to demand. -Continue to monitor  Chronic kidney disease stage IIIb: Chronic.  Creatinine 1.8 with BUN 25 on admission.  Baseline appears to range from 1.6-1.7. 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  On admission hemoglobin 8.8 with elevated MCV and MCH.  Patient followed in the outpatient setting by Dr. Ennever and receives Aranesps.  -Continue to monitor H&H  Hyperlipidemia: Patient previously had a history of rhabdomyolysis with statins. -Restart fish oil supplements outpatient setting  Insomnia/anxiety -Continue Ativan nightly  Thrombocytopenia: Chronic.  Platelet count 122 on admission. -Continue to monitor   DVT prophylaxis: lovenox  Code Status: DNR Family Communication: No family requested to be updated at this time Disposition Plan: Possible discharge home in 2 to 3 days once adequately  diuresed Consults called: None Admission status: Inpatient   A  MD [facility] Pager [phone]   If 7PM-7AM, please contact night-coverage [email or web address] Password [email or web address]  09/25/2019, 3:49 PM     

## 2019-09-25 NOTE — ED Notes (Signed)
Call to floor to provide report, receiving nurse Rona Ravens RN) unable to take call now request call back in 5 minutes.

## 2019-09-26 ENCOUNTER — Inpatient Hospital Stay (HOSPITAL_COMMUNITY): Payer: Medicare Other

## 2019-09-26 ENCOUNTER — Other Ambulatory Visit: Payer: Self-pay | Admitting: Hematology & Oncology

## 2019-09-26 DIAGNOSIS — I5033 Acute on chronic diastolic (congestive) heart failure: Secondary | ICD-10-CM

## 2019-09-26 DIAGNOSIS — N1832 Chronic kidney disease, stage 3b: Secondary | ICD-10-CM

## 2019-09-26 DIAGNOSIS — G894 Chronic pain syndrome: Secondary | ICD-10-CM

## 2019-09-26 DIAGNOSIS — F411 Generalized anxiety disorder: Secondary | ICD-10-CM

## 2019-09-26 DIAGNOSIS — C9310 Chronic myelomonocytic leukemia not having achieved remission: Secondary | ICD-10-CM

## 2019-09-26 DIAGNOSIS — D6481 Anemia due to antineoplastic chemotherapy: Secondary | ICD-10-CM

## 2019-09-26 DIAGNOSIS — T451X5A Adverse effect of antineoplastic and immunosuppressive drugs, initial encounter: Secondary | ICD-10-CM

## 2019-09-26 LAB — ECHOCARDIOGRAM COMPLETE
Height: 69.5 in
Weight: 3160 oz

## 2019-09-26 LAB — BASIC METABOLIC PANEL
Anion gap: 14 (ref 5–15)
BUN: 22 mg/dL (ref 8–23)
CO2: 25 mmol/L (ref 22–32)
Calcium: 9.2 mg/dL (ref 8.9–10.3)
Chloride: 102 mmol/L (ref 98–111)
Creatinine, Ser: 1.71 mg/dL — ABNORMAL HIGH (ref 0.61–1.24)
GFR calc Af Amer: 40 mL/min — ABNORMAL LOW (ref >60)
GFR calc non Af Amer: 35 mL/min — ABNORMAL LOW (ref >60)
Glucose, Bld: 95 mg/dL (ref 70–99)
Potassium: 3.1 mmol/L — ABNORMAL LOW (ref 3.5–5.1)
Sodium: 141 mmol/L (ref 135–145)

## 2019-09-26 MED ORDER — POTASSIUM CHLORIDE CRYS ER 20 MEQ PO TBCR
40.0000 meq | EXTENDED_RELEASE_TABLET | ORAL | Status: AC
  Administered 2019-09-26 (×3): 40 meq via ORAL
  Filled 2019-09-26 (×3): qty 2

## 2019-09-26 MED ORDER — MAGNESIUM OXIDE 400 (241.3 MG) MG PO TABS
800.0000 mg | ORAL_TABLET | Freq: Once | ORAL | Status: AC
  Administered 2019-09-26: 09:00:00 800 mg via ORAL
  Filled 2019-09-26: qty 2

## 2019-09-26 NOTE — Progress Notes (Signed)
  Echocardiogram 2D Echocardiogram has been performed.  Jeremiah Walker 09/26/2019, 9:10 AM

## 2019-09-26 NOTE — Progress Notes (Signed)
PROGRESS NOTE    Jeremiah Walker  ONG:[id number] DOB: [birth year] DOA: 09/25/2019 PCP: [name], MD   Brief Narrative:  86 year old with history of HTN, CHF EF 60% 10/20, diffuse B-cell lymphoma, CML, HLD, CAD, CKD stage IIIa presents with progressive dyspnea for the past 10 days. Admitted for CHF exacerbation, CTA showed moderate bilateral pleural effusion. 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 Fluid restriction -Echocardiogram ordered -Supportive care.  Elevated troponin -Secondary to demand ischemia. Does not have any chest pain. -EKG-No acute changes.  -Had stress test about 6 months ago showing fixed defect But with normal EF on echo and was deemed to be an artifact.  CKD stage IIIb -Baseline creatinine 1.8. Continue diuresis and monitor this.  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 Routinely receives Aranesp from Dr Marin Olp  Hyperlipidemia -History of rhabdomyolysis with statin. Currently on fish oil  Insomnia/anxiety -Bedtime Ativan  Thrombocytopenia, chronic -No evidence of bleeding. Continue to monitor   DVT prophylaxis: Lovenox Code Status: DNR Family Communication:   Disposition Plan:   Patient From= [place]; patient was sent to [facility] Transferred here  Patient Anticipated D/C place= to be determined  Barriers=  maintain hospital stay for aggressive IV diuretics   Subjective: Overall tells me he feels better after being diuresed overnight, still has some exertional dyspnea.  Review of Systems Otherwise negative except as per HPI, including: General: Denies fever, chills, night sweats or unintended weight loss. Resp: Denies hemoptysis Cardiac: Denies chest pain, palpitations, orthopnea, paroxysmal nocturnal dyspnea. GI: Denies abdominal pain, nausea, vomiting, diarrhea or constipation GU: Denies dysuria, frequency, hesitancy or incontinence MS: Denies muscle aches, joint pain or swelling Neuro: Denies headache, neurologic deficits (focal weakness, numbness, tingling), abnormal gait Psych: Denies anxiety, depression, SI/HI/AVH Skin: Denies new rashes or lesions ID: Denies sick contacts, exotic exposures, travel  Examination:  General exam: Appears calm and comfortable  Respiratory system: Bibasilar crackles Cardiovascular system: S1 & S2 heard, RRR. No JVD, murmurs, rubs, gallops or clicks. No pedal edema. Gastrointestinal system: Abdomen is nondistended, soft and nontender. No organomegaly or masses felt. Normal bowel sounds heard. Central nervous system: Alert and oriented. No focal neurological deficits. Extremities: Symmetric 5 x 5 power. Skin: No rashes, lesions or ulcers Psychiatry: Judgement and insight appear normal. Mood & affect appropriate.     Objective: Vitals:   09/26/19 0153 09/26/19 0314 09/26/19 0335 09/26/19 0719  BP:   (!) 111/55 (!) 108/54  Pulse:   80 87  Resp:   19 18  Temp:   97.8 F (36.6 C) 97.8 F (36.6 C)  TempSrc:   Oral Oral  SpO2:   (!) 85% 94%  Weight: 89.7 kg 89.6 kg    Height:        Intake/Output Summary (Last 24 hours) at 09/26/2019 0743 Last data filed at 09/26/2019 0500 Gross per 24 hour  Intake --  Output 4165 ml  Net -4165 ml  Filed Weights   09/25/19 1538 09/26/19 0153 09/26/19 0314  Weight: 91.6 kg 89.7 kg 89.6 kg     Data  Reviewed:   CBC: Recent Labs  Lab 09/25/19 0854  WBC 6.8  NEUTROABS 2.3  HGB 8.8*  HCT 27.2*  MCV 119.3*  PLT 003*   Basic Metabolic Panel: Recent Labs  Lab 09/25/19 0854 09/26/19 0526  NA 141 141  K 3.7 3.1*  CL 104 102  CO2 24 25  GLUCOSE 123* 95  BUN 25* 22  CREATININE 1.80* 1.71*  CALCIUM 9.3 9.2   GFR: Estimated Creatinine Clearance: 32.7 mL/min (A) (by C-G formula based on SCr of 1.71 mg/dL (H)). Liver Function Tests: Recent Labs  Lab 09/25/19 0854  AST 21  ALT 14  ALKPHOS 51  BILITOT 1.4*  PROT 7.0  ALBUMIN 3.8   No results for input(s): LIPASE, AMYLASE in the last 168 hours. No results for input(s): AMMONIA in the last 168 hours. Coagulation Profile: No results for input(s): INR, PROTIME in the last 168 hours. Cardiac Enzymes: No results for input(s): CKTOTAL, CKMB, CKMBINDEX, TROPONINI in the last 168 hours. BNP (last 3 results) No results for input(s): PROBNP in the last 8760 hours. HbA1C: No results for input(s): HGBA1C in the last 72 hours. CBG: No results for input(s): GLUCAP in the last 168 hours. Lipid Profile: No results for input(s): CHOL, HDL, LDLCALC, TRIG, CHOLHDL, LDLDIRECT in the last 72 hours. Thyroid Function Tests: No results for input(s): TSH, T4TOTAL, FREET4, T3FREE, THYROIDAB in the last 72 hours. Anemia Panel: No results for input(s): VITAMINB12, FOLATE, FERRITIN, TIBC, IRON, RETICCTPCT in the last 72 hours. Sepsis Labs: No results for input(s): PROCALCITON, LATICACIDVEN in the last 168 hours.  Recent Results (from the past 240 hour(s))  SARS Coronavirus 2 Ag (30 min TAT) - Nasal Swab (BD Veritor Kit)     Status: None   Collection Time: 09/25/19  9:20 AM   Specimen: Nasal Swab (BD Veritor Kit)  Result Value Ref Range Status   SARS Coronavirus 2 Ag NEGATIVE NEGATIVE Final    Comment: (NOTE) SARS-CoV-2 antigen NOT DETECTED.  Negative results are presumptive.  Negative results do not preclude SARS-CoV-2 infection and  should not be used as the sole basis for treatment or other patient management decisions, including infection  control decisions, particularly in the presence of clinical signs and  symptoms consistent with COVID-19, or in those who have been in contact with the virus.  Negative results must be combined with clinical observations, patient history, and epidemiological information. The expected result is Negative. Fact Sheet for Patients: PodPark.tn Fact Sheet for Healthcare Providers: GiftContent.is This test is not yet approved or cleared by the [place] FDA and  has been authorized for detection and/or diagnosis of SARS-CoV-2 by FDA under an Emergency Use Authorization (EUA).  This EUA will remain in effect (meaning this test can be used) for the duration of  the COVID-19 de claration under Section 564(b)(1) of the Act, 21 U.S.C. section 360bbb-3(b)(1), unless the authorization is terminated or revoked sooner. Performed at [facility], [address] [place], [place] [address]   SARS CORONAVIRUS 2 (TAT 6-24 HRS) Nasopharyngeal Nasopharyngeal Swab     Status: None   Collection Time: 09/25/19 11:19 AM   Specimen: Nasopharyngeal Swab  Result Value Ref Range Status   SARS Coronavirus 2 NEGATIVE NEGATIVE Final    Comment: (NOTE) SARS-CoV-2 target nucleic acids are NOT DETECTED. The SARS-CoV-2 RNA is generally detectable in upper  and lower respiratory specimens during the acute phase of infection. Negative results do not preclude SARS-CoV-2 infection, do not rule out co-infections with other pathogens, and should not be used as the sole basis for treatment or other patient management decisions. Negative results must be combined with clinical observations, patient history, and epidemiological information. The expected result is Negative. Fact Sheet for Patients: SugarRoll.be Fact  Sheet for Healthcare Providers: https://www.woods-mathews.com/ This test is not yet approved or cleared by the [place] FDA and  has been authorized for detection and/or diagnosis of SARS-CoV-2 by FDA under an Emergency Use Authorization (EUA). This EUA will remain  in effect (meaning this test can be used) for the duration of the COVID-19 declaration under Section 56 4(b)(1) of the Act, 21 U.S.C. section 360bbb-3(b)(1), unless the authorization is terminated or revoked sooner. Performed at [facility], [place] [address] [place], [place] [address]          Radiology Studies: CT Angio Chest PE W and/or Wo Contrast  Result Date: 09/25/2019 CLINICAL DATA:  Shortness of breath EXAM: CT ANGIOGRAPHY CHEST WITH CONTRAST TECHNIQUE: Multidetector CT imaging of the chest was performed using the standard protocol during bolus administration of intravenous contrast. Multiplanar CT image reconstructions and MIPs were obtained to evaluate the vascular anatomy. CONTRAST:  36m OMNIPAQUE IOHEXOL 350 MG/ML SOLN COMPARISON:  03/26/2019 FINDINGS: Cardiovascular: Cardiomegaly. Relatively decreased density of the left atrial appendage. Extensive atherosclerotic plaque on the aorta and coronaries. Aortic valve calcification. No acute aortic finding. No pulmonary artery filling defect Mediastinum/Nodes: History of lymphoma.  No adenopathy or mass. Lungs/Pleura: Moderate layering pleural effusions. Interlobular septal and fissure thickening. No consolidation. The central airways are clear with changes of bronchomalacia Upper Abdomen: Renal cystic densities including a thin benign-appearing calcification of a partially covered left-sided cyst. Atherosclerosis Musculoskeletal: Spondylosis and mild scoliosis.  No acute finding. Review of the MIP images confirms the above findings. IMPRESSION: 1. CHF including moderate bilateral layering pleural effusion. 2. No visible enhancement within the left atrial  appendage which could be delayed filling or thrombus. Please correlate for atrial fibrillation. 3. Negative for pulmonary embolism. 4. Aortic valve calcification. Electronically Signed   By: JMonte FantasiaM.D.   On: 09/25/2019 10:31   DG Chest Portable 1 View  Result Date: 09/25/2019 CLINICAL DATA:  Shortness of breath. EXAM: PORTABLE CHEST 1 VIEW COMPARISON:  03/26/2019. FINDINGS: Cardiomegaly. Mild bilateral interstitial prominence. Mild CHF cannot be excluded. Pneumonitis can not be excluded. Small left pleural effusion. No pneumothorax. IMPRESSION: Cardiomegaly with mild bilateral interstitial prominence with small left pleural effusion. Findings suggest mild CHF. Electronically Signed   By: TMarcello Moores Register   On: 09/25/2019 09:35        Scheduled Meds: . allopurinol  200 mg Oral Q breakfast  . ALPRAZolam  1 mg Oral QHS  . bethanechol  25 mg Oral TID  . enoxaparin (LOVENOX) injection  40 mg Subcutaneous Q24H  . folic acid  2 mg Oral QHS  . furosemide  40 mg Intravenous BID  . magnesium oxide  200 mg Oral Daily  . melatonin  21 mg Oral QHS  . pregabalin  25 mg Oral TID  . sodium chloride flush  3 mL Intravenous Q12H   Continuous Infusions: . sodium chloride       LOS: 1 day   Time spent= 25 mins    Hosanna Betley CArsenio Loader MD [facility]  If 7PM-7AM, please contact night-coverage  09/26/2019, 7:43 AM

## 2019-09-27 LAB — BASIC METABOLIC PANEL
Anion gap: 13 (ref 5–15)
BUN: 27 mg/dL — ABNORMAL HIGH (ref 8–23)
CO2: 25 mmol/L (ref 22–32)
Calcium: 9.2 mg/dL (ref 8.9–10.3)
Chloride: 102 mmol/L (ref 98–111)
Creatinine, Ser: 1.84 mg/dL — ABNORMAL HIGH (ref 0.61–1.24)
GFR calc Af Amer: 37 mL/min — ABNORMAL LOW (ref >60)
GFR calc non Af Amer: 32 mL/min — ABNORMAL LOW (ref >60)
Glucose, Bld: 88 mg/dL (ref 70–99)
Potassium: 3.6 mmol/L (ref 3.5–5.1)
Sodium: 140 mmol/L (ref 135–145)

## 2019-09-27 LAB — CBC
HCT: 28.3 % — ABNORMAL LOW (ref 39.0–52.0)
Hemoglobin: 9.2 g/dL — ABNORMAL LOW (ref 13.0–17.0)
MCH: 38.2 pg — ABNORMAL HIGH (ref 26.0–34.0)
MCHC: 32.5 g/dL (ref 30.0–36.0)
MCV: 117.4 fL — ABNORMAL HIGH (ref 80.0–100.0)
Platelets: 120 10*3/uL — ABNORMAL LOW (ref 150–400)
RBC: 2.41 MIL/uL — ABNORMAL LOW (ref 4.22–5.81)
RDW: 17 % — ABNORMAL HIGH (ref 11.5–15.5)
WBC: 5.8 10*3/uL (ref 4.0–10.5)
nRBC: 0.7 % — ABNORMAL HIGH (ref 0.0–0.2)

## 2019-09-27 LAB — VITAMIN B12: Vitamin B-12: 2799 pg/mL — ABNORMAL HIGH (ref 180–914)

## 2019-09-27 LAB — MAGNESIUM: Magnesium: 2 mg/dL (ref 1.7–2.4)

## 2019-09-27 MED ORDER — FUROSEMIDE 40 MG PO TABS: 40.0000 mg | ORAL_TABLET | Freq: Every day | ORAL | 0 refills | Status: DC | PRN

## 2019-09-27 MED ORDER — POTASSIUM CHLORIDE CRYS ER 20 MEQ PO TBCR
40.0000 meq | EXTENDED_RELEASE_TABLET | Freq: Once | ORAL | Status: AC
  Administered 2019-09-27: 09:00:00 40 meq via ORAL
  Filled 2019-09-27: qty 2

## 2019-09-27 MED ORDER — POTASSIUM CHLORIDE ER 20 MEQ PO TBCR: 40.0000 meq | EXTENDED_RELEASE_TABLET | Freq: Every day | ORAL | 0 refills | Status: DC

## 2019-09-27 NOTE — Discharge Summary (Signed)
Physician Discharge Summary  Jeremiah Walker MVH:[id number] DOB: 04/16/1928 DOA: 09/25/2019  PCP: [name], MD  Admit date: 09/25/2019 Discharge date: 09/27/2019  Admitted From: Home Disposition: Home  Recommendations for Outpatient Follow-up:  1. Follow up with PCP in 1-2 weeks 2. Please obtain BMP/CBC in one week your next doctors visit.  3. Take Lasix 40 mg orally tomorrow on 4/5 and on 4/6.  Potassium supplements for 2 days has been given.  Thereafter take Lasix 40 mg orally daily as needed for weight gain, lower extremity swelling or shortness of breath.  Advised him that if he has to do this periodically, he needs to follow-up with his cardiologist and notify them.  Regardless of how he does over the next several days he still needs to follow-up with cardiologist in 3-4 weeks  Discharge Condition: Stable CODE STATUS: Full code Diet recommendation: Heart healthy  Brief/Interim Summary: 86 year old with history of HTN, CHF EF 60% 10/20, diffuse B-cell lymphoma, CML, HLD, CAD, CKD stage IIIa presents with progressive dyspnea for the past 10 days. Admitted for CHF exacerbation, CTA showed moderate bilateral pleural effusion. Started on diuretics Over the course of couple of days in the hospital patient was diuresed with IV Lasix, symptomatically he started feeling much better where he was able to take deep breaths.  He denied any shortness of breath or any complaints at all.  On the day of discharge with ambulation he was getting slightly hypoxic down to 88% with quick recovery.  He still wanted to go home therefore I requested him to take Lasix 40 mg orally tomorrow and the day after with potassium supplements thereafter he can transition Lasix to as needed.  Given instructions how and when to take it and follow-up outpatient cardiology.  Currently medically stable to be discharged.  Dyspnea on exertion, resolved Acute congestive heart failure with preserved ejection fraction, 60%,  class I Moderate bilateral pleural effusions -Echocardiogram here showed EF of 60%.  His shortness of breath is completely resolved and diuresed well with IV Lasix.  Will transition to p.o. Lasix 40 mg to be taken daily for next 2 days and thereafter transition to as needed.  Potassium supplements have been given. -Advised to check daily weight and monitor his symptoms. -Follow-up outpatient cardiology next 3-4 weeks.  Elevated troponin -Likely demand ischemia, EKG without any acute changes.  Stress test 6 months ago showed fixed defect and was seen by cardiology.  CKD stage IIIb -Baseline creatinine 1.8. Continue diuresis and monitor this.  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 Routinely receives Aranesp from Dr Marin Olp  Hyperlipidemia -History of rhabdomyolysis with statin. Currently on fish oil  Insomnia/anxiety -Bedtime Ativan  Thrombocytopenia, chronic -No evidence of bleeding. 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  Ambulating in the hallway without any issues.  Had one episode of desaturation down to 88% with ambulation without any shortness of breath or chest pain.  Discharge Exam: Vitals:   09/27/19 0026 09/27/19 0527  BP: 118/69 122/61  Pulse: 82 79  Resp: 16 14  Temp: (!) 97.4 F (36.3 C) 98 F (36.7 C)  SpO2: 97% 92%   Vitals:   09/26/19 1127 09/26/19 2049 09/27/19 0026 09/27/19 0527  BP: 121/63 110/63 118/69 122/61   Pulse: 82 83 82 79  Resp: '16 16 16 14  ' Temp: 97.6 F (36.4 C) 98 F (36.7 C) (!) 97.4 F (36.3 C) 98 F (36.7 C)  TempSrc: Oral Oral Oral Oral  SpO2: 95% 92% 97% 92%  Weight:    87.8 kg  Height:        General: Pt is alert, awake, not in acute distress Cardiovascular: RRR, S1/S2 +, no rubs, no gallops Respiratory: Minimal bibasilar crackles Abdominal: Soft, NT, ND, bowel sounds + Extremities: no edema, no cyanosis  Discharge Instructions   Allergies as of 09/27/2019      Reactions   Nitroglycerin Other (See Comments)   "I ended up in a fetal position from the pain"   Nsaids Other (See Comments), Tinitus   Bruising   Statins Nausea And Vomiting, Other (See Comments)   Kidney issues and muscle pain   Tolmetin Other (See Comments)   Bruising   Ciprofloxacin Other (See Comments)   Achilles tendon issues   Ropinirole Anxiety, Other (See Comments)   Mood swings, also   Requip [ropinirole Hcl] Anxiety      Medication List    TAKE these medications   allopurinol 100 MG tablet Commonly known as: ZYLOPRIM TAKE 2 TABLETS(200 MG) BY MOUTH DAILY What changed:   how much to take  how to take this  when to take this  additional instructions   ALPRAZolam 0.5 MG tablet Commonly known as: XANAX TAKE 1 TABLET(0.5 MG) BY MOUTH THREE TIMES DAILY AS NEEDED FOR ANXIETY What changed:   how much to take  how to take this  when to take this  additional instructions   amoxicillin 500 MG capsule Commonly known as: AMOXIL Take 4 caps (2gm) by mouth once prior to dental procedure What changed:   how much to take  how to take this  when to take this  additional instructions   azelastine 0.1 % nasal spray Commonly known as: ASTELIN Place 2 sprays into both nostrils 2 (two) times daily. Use in each nostril as directed What changed:   when to take this  reasons to take this  additional instructions   b complex vitamins tablet Take 1 tablet by mouth daily.    baclofen 10 MG tablet Commonly known as: LIORESAL Take 1/2 to 1 by mouth every 8hrs as needed for spasm What changed:   how much to take  how to take this  when to take this  reasons to take this  additional instructions   bethanechol 25 MG tablet Commonly known as: URECHOLINE TAKE 1 TABLET(25 MG) BY MOUTH THREE TIMES DAILY What changed:   how much to take  how to take this  when to take this  additional instructions   Calcium 500 MG tablet Take 500 mg by mouth daily.   folic acid 1 MG tablet Commonly known as: FOLVITE TAKE 2 TABLETS(2 MG) BY MOUTH DAILY What changed: See the new instructions.   furosemide 40 MG tablet Commonly known as: Lasix Take 1 tablet (40 mg total) by mouth daily as needed for fluid or edema.   HYDROcodone-acetaminophen 7.5-325 MG tablet Commonly known as: NORCO Take 1 tablet by mouth every 6 (six) hours as needed for moderate pain.   Magnesium  Oxide 200 MG Tabs Take 200 mg by mouth daily.   Melatonin 10 MG Tabs Take 20 mg by mouth at bedtime.   METAMUCIL PO Take by mouth See admin instructions. Mix 1 teaspoonful into 6-8 ounces of water and drink (in conjunction with Miralax) once a day only when taking Norco   multivitamin tablet Take 1 tablet by mouth daily.   OMEGA-3 FISH OIL PO Take 1 capsule by mouth daily. EPA/DHA 520/350   polyethylene glycol 17 g packet Commonly known as: MIRALAX / GLYCOLAX Take 17 g by mouth daily as needed (into 6-8 ounces of water and drink (in conjunction with Metamucil) once a day only when taking Norco).   Potassium Chloride ER 20 MEQ Tbcr Take 40 mEq by mouth daily for 2 days.   predniSONE 20 MG tablet Commonly known as: DELTASONE May take 1 tablet daily for up to 3 days as needed for gout attack or muscle spasm What changed:   how much to take  how to take this  when to take this  additional instructions   pregabalin 25 MG capsule Commonly known as: Lyrica Take 1 capsule (25 mg  total) by mouth 3 (three) times daily.   Systane Ultra PF 0.4-0.3 % Soln Generic drug: Polyethyl Glyc-Propyl Glyc PF Place 1-2 drops into both eyes 3 (three) times daily as needed (for dryness).   VITAMIN B-12 PO Take 1 tablet by mouth daily after breakfast.   VITAMIN B-6 PO Take 1 tablet by mouth daily after breakfast.   vitamin C 500 MG tablet Commonly known as: ASCORBIC ACID Take 1,000 mg by mouth daily.   Vitamin D3 50 MCG (2000 UT) capsule Take 2,000 Units by mouth daily.   zinc gluconate 50 MG tablet Take 50 mg by mouth daily.      Follow-up Information    Copland, Gay Filler, MD. Schedule an appointment as soon as possible for a visit in 1 week(s).   Specialty: Family Medicine Contact information: [address] [place] [address] [phone]        Donato Heinz, MD. Schedule an appointment as soon as possible for a visit in 3 week(s).   Specialty: Cardiology Contact information: [address] [place] [address] [phone]          Allergies  Allergen Reactions  . Nitroglycerin Other (See Comments)    "I ended up in a fetal position from the pain"  . Nsaids Other (See Comments) and Tinitus    Bruising   . Statins Nausea And Vomiting and Other (See Comments)    Kidney issues and muscle pain   . Tolmetin Other (See Comments)    Bruising  . Ciprofloxacin Other (See Comments)    Achilles tendon issues  . Ropinirole Anxiety and Other (See Comments)    Mood swings, also  . Requip [Ropinirole Hcl] Anxiety    You were cared for by a hospitalist during your hospital stay. If you have any questions about your discharge medications or the care you received while you were in the hospital after you are discharged, you can call the unit and asked to speak with the hospitalist on call if the hospitalist that took care of you is not available. Once you are discharged, your primary care physician will handle any  further medical issues. Please note that no refills for any discharge medications will be authorized once you are discharged, as it is imperative that you return to your primary  care physician (or establish a relationship with a primary care physician if you do not have one) for your aftercare needs so that they can reassess your need for medications and monitor your lab values.   Procedures/Studies: CT Angio Chest PE W and/or Wo Contrast  Result Date: 09/25/2019 CLINICAL DATA:  Shortness of breath EXAM: CT ANGIOGRAPHY CHEST WITH CONTRAST TECHNIQUE: Multidetector CT imaging of the chest was performed using the standard protocol during bolus administration of intravenous contrast. Multiplanar CT image reconstructions and MIPs were obtained to evaluate the vascular anatomy. CONTRAST:  11m OMNIPAQUE IOHEXOL 350 MG/ML SOLN COMPARISON:  03/26/2019 FINDINGS: Cardiovascular: Cardiomegaly. Relatively decreased density of the left atrial appendage. Extensive atherosclerotic plaque on the aorta and coronaries. Aortic valve calcification. No acute aortic finding. No pulmonary artery filling defect Mediastinum/Nodes: History of lymphoma.  No adenopathy or mass. Lungs/Pleura: Moderate layering pleural effusions. Interlobular septal and fissure thickening. No consolidation. The central airways are clear with changes of bronchomalacia Upper Abdomen: Renal cystic densities including a thin benign-appearing calcification of a partially covered left-sided cyst. Atherosclerosis Musculoskeletal: Spondylosis and mild scoliosis.  No acute finding. Review of the MIP images confirms the above findings. IMPRESSION: 1. CHF including moderate bilateral layering pleural effusion. 2. No visible enhancement within the left atrial appendage which could be delayed filling or thrombus. Please correlate for atrial fibrillation. 3. Negative for pulmonary embolism. 4. Aortic valve calcification. Electronically Signed   By: JMonte FantasiaM.D.    On: 09/25/2019 10:31   DG Chest Portable 1 View  Result Date: 09/25/2019 CLINICAL DATA:  Shortness of breath. EXAM: PORTABLE CHEST 1 VIEW COMPARISON:  03/26/2019. FINDINGS: Cardiomegaly. Mild bilateral interstitial prominence. Mild CHF cannot be excluded. Pneumonitis can not be excluded. Small left pleural effusion. No pneumothorax. IMPRESSION: Cardiomegaly with mild bilateral interstitial prominence with small left pleural effusion. Findings suggest mild CHF. Electronically Signed   By: TMarcello Moores Register   On: 09/25/2019 09:35   ECHOCARDIOGRAM COMPLETE  Result Date: 09/26/2019    ECHOCARDIOGRAM REPORT   Patient Name:   Jeremiah COSSEYDate of Exam: 09/26/2019 Medical Rec #:  [id number]       Height:       69.5 in Accession #:    [id number]      Weight:       197.5 lb Date of Birth:  [birth year]       BSA:          2.065 m Patient Age:    89years         BP:           108/54 mmHg Patient Gender: M                HR:           92 bpm. Exam Location:  Inpatient Procedure: 2D Echo, Cardiac Doppler and Color Doppler Indications:    I50.33 Acute on chronic diastolic (congestive) heart failure  History:        Patient has prior history of Echocardiogram examinations, most                 recent 03/27/2019. Risk Factors:Hypertension. CKD. GERD.  Sonographer:    Tiffany Dance Referring Phys: [id number] A SMITH IMPRESSIONS  1. Left ventricular ejection fraction, by estimation, is 55 to 60%. The left ventricle has normal function. The left ventricle has no regional wall motion abnormalities. There is mild left ventricular hypertrophy. Left ventricular diastolic parameters are consistent with Grade  I diastolic dysfunction (impaired relaxation). Elevated left ventricular end-diastolic pressure.  2. Right ventricular systolic function is normal. The right ventricular size is normal.  3. Left atrial size was severely dilated.  4. The mitral valve is abnormal. Trivial mitral valve regurgitation. The mean mitral valve  gradient is 3.0 mmHg.  5. The aortic valve is tricuspid. Aortic valve regurgitation is trivial. Moderate aortic valve stenosis. Aortic valve area, by VTI measures 1.03 cm. Aortic valve mean gradient measures 17.6 mmHg. Aortic valve Vmax measures 2.83 m/s.  6. The inferior vena cava is normal in size with greater than 50% respiratory variability, suggesting right atrial pressure of 3 mmHg. FINDINGS  Left Ventricle: Left ventricular ejection fraction, by estimation, is 55 to 60%. The left ventricle has normal function. The left ventricle has no regional wall motion abnormalities. The left ventricular internal cavity size was normal in size. There is  mild left ventricular hypertrophy. Left ventricular diastolic parameters are consistent with Grade I diastolic dysfunction (impaired relaxation). Elevated left ventricular end-diastolic pressure. Right Ventricle: The right ventricular size is normal. No increase in right ventricular wall thickness. Right ventricular systolic function is normal. Left Atrium: Left atrial size was severely dilated. Right Atrium: Right atrial size was normal in size. Pericardium: There is no evidence of pericardial effusion. Mitral Valve: The mitral valve is abnormal. There is mild thickening of the mitral valve leaflet(s). Moderate mitral annular calcification. Trivial mitral valve regurgitation. MV peak gradient, 6.9 mmHg. The mean mitral valve gradient is 3.0 mmHg. Tricuspid Valve: The tricuspid valve is grossly normal. Tricuspid valve regurgitation is trivial. Aortic Valve: The aortic valve is tricuspid. Aortic valve regurgitation is trivial. Moderate aortic stenosis is present. Aortic valve mean gradient measures 17.6 mmHg. Aortic valve peak gradient measures 32.1 mmHg. Aortic valve area, by VTI measures 1.03  cm. Pulmonic Valve: The pulmonic valve was grossly normal. Pulmonic valve regurgitation is not visualized. Aorta: The aortic root and ascending aorta are structurally normal,  with no evidence of dilitation. Venous: The inferior vena cava is normal in size with greater than 50% respiratory variability, suggesting right atrial pressure of 3 mmHg. IAS/Shunts: The interatrial septum was not well visualized.  LEFT VENTRICLE PLAX 2D LVIDd:         4.31 cm  Diastology LVIDs:         3.00 cm  LV e' lateral:   8.59 cm/s LV PW:         1.22 cm  LV E/e' lateral: 11.0 LV IVS:        1.05 cm  LV e' medial:    4.13 cm/s LVOT diam:     2.00 cm  LV E/e' medial:  22.8 LV SV:         58 LV SV Index:   28 LVOT Area:     3.14 cm  RIGHT VENTRICLE             IVC RV Basal diam:  3.24 cm     IVC diam: 1.77 cm RV Mid diam:    2.44 cm RV S prime:     11.70 cm/s TAPSE (M-mode): 2.4 cm LEFT ATRIUM              Index       RIGHT ATRIUM           Index LA diam:        4.40 cm  2.13 cm/m  RA Area:     14.60 cm LA Vol (A2C):   125.0  ml 60.53 ml/m RA Volume:   33.90 ml  16.42 ml/m LA Vol (A4C):   102.0 ml 49.39 ml/m LA Biplane Vol: 113.0 ml 54.72 ml/m  AORTIC VALVE AV Area (Vmax):    1.05 cm AV Area (Vmean):   0.97 cm AV Area (VTI):     1.03 cm AV Vmax:           283.20 cm/s AV Vmean:          192.800 cm/s AV VTI:            0.564 m AV Peak Grad:      32.1 mmHg AV Mean Grad:      17.6 mmHg LVOT Vmax:         95.00 cm/s LVOT Vmean:        59.750 cm/s LVOT VTI:          0.185 m LVOT/AV VTI ratio: 0.33  AORTA Ao Root diam: 4.00 cm Ao Asc diam:  3.20 cm MITRAL VALVE MV Area (PHT): 2.83 cm     SHUNTS MV Peak grad:  6.9 mmHg     Systemic VTI:  0.18 m MV Mean grad:  3.0 mmHg     Systemic Diam: 2.00 cm MV Vmax:       1.31 m/s MV Vmean:      84.5 cm/s MV Decel Time: 268 msec MV E velocity: 94.10 cm/s MV A velocity: 122.00 cm/s MV E/A ratio:  0.77 Lyman Bishop MD Electronically signed by Lyman Bishop MD Signature Date/Time: 09/26/2019/11:40:51 AM    Final       The results of significant diagnostics from this hospitalization (including imaging, microbiology, ancillary and laboratory) are listed below for  reference.     Microbiology: Recent Results (from the past 240 hour(s))  SARS Coronavirus 2 Ag (30 min TAT) - Nasal Swab (BD Veritor Kit)     Status: None   Collection Time: 09/25/19  9:20 AM   Specimen: Nasal Swab (BD Veritor Kit)  Result Value Ref Range Status   SARS Coronavirus 2 Ag NEGATIVE NEGATIVE Final    Comment: (NOTE) SARS-CoV-2 antigen NOT DETECTED.  Negative results are presumptive.  Negative results do not preclude SARS-CoV-2 infection and should not be used as the sole basis for treatment or other patient management decisions, including infection  control decisions, particularly in the presence of clinical signs and  symptoms consistent with COVID-19, or in those who have been in contact with the virus.  Negative results must be combined with clinical observations, patient history, and epidemiological information. The expected result is Negative. Fact Sheet for Patients: PodPark.tn Fact Sheet for Healthcare Providers: GiftContent.is This test is not yet approved or cleared by the [place] FDA and  has been authorized for detection and/or diagnosis of SARS-CoV-2 by FDA under an Emergency Use Authorization (EUA).  This EUA will remain in effect (meaning this test can be used) for the duration of  the COVID-19 de claration under Section 564(b)(1) of the Act, 21 U.S.C. section 360bbb-3(b)(1), unless the authorization is terminated or revoked sooner. Performed at [facility], [address] [place], [place] [address]   SARS CORONAVIRUS 2 (TAT 6-24 HRS) Nasopharyngeal Nasopharyngeal Swab     Status: None   Collection Time: 09/25/19 11:19 AM   Specimen: Nasopharyngeal Swab  Result Value Ref Range Status   SARS Coronavirus 2 NEGATIVE NEGATIVE Final    Comment: (NOTE) SARS-CoV-2 target nucleic acids are NOT DETECTED. The SARS-CoV-2 RNA is generally detectable  in upper and lower respiratory  specimens during the acute phase of infection. Negative results do not preclude SARS-CoV-2 infection, do not rule out co-infections with other pathogens, and should not be used as the sole basis for treatment or other patient management decisions. Negative results must be combined with clinical observations, patient history, and epidemiological information. The expected result is Negative. Fact Sheet for Patients: SugarRoll.be Fact Sheet for Healthcare Providers: https://www.woods-mathews.com/ This test is not yet approved or cleared by the [place] FDA and  has been authorized for detection and/or diagnosis of SARS-CoV-2 by FDA under an Emergency Use Authorization (EUA). This EUA will remain  in effect (meaning this test can be used) for the duration of the COVID-19 declaration under Section 56 4(b)(1) of the Act, 21 U.S.C. section 360bbb-3(b)(1), unless the authorization is terminated or revoked sooner. Performed at [facility], [place] [address] [place], [place] [address]      Labs: BNP (last 3 results) Recent Labs    09/25/19 0854  BNP 865.7*   Basic Metabolic Panel: Recent Labs  Lab 09/25/19 0854 09/26/19 0526 09/27/19 0501  NA 141 141 140  K 3.7 3.1* 3.6  CL 104 102 102  CO2 '24 25 25  ' GLUCOSE 123* 95 88  BUN 25* 22 27*  CREATININE 1.80* 1.71* 1.84*  CALCIUM 9.3 9.2 9.2  MG  --   --  2.0   Liver Function Tests: Recent Labs  Lab 09/25/19 0854  AST 21  ALT 14  ALKPHOS 51  BILITOT 1.4*  PROT 7.0  ALBUMIN 3.8   No results for input(s): LIPASE, AMYLASE in the last 168 hours. No results for input(s): AMMONIA in the last 168 hours. CBC: Recent Labs  Lab 09/25/19 0854 09/27/19 0501  WBC 6.8 5.8  NEUTROABS 2.3  --   HGB 8.8* 9.2*  HCT 27.2* 28.3*  MCV 119.3* 117.4*  PLT 122* 120*   Cardiac Enzymes: No results for input(s): CKTOTAL, CKMB, CKMBINDEX, TROPONINI in the last 168 hours. BNP: Invalid  input(s): POCBNP CBG: No results for input(s): GLUCAP in the last 168 hours. D-Dimer Recent Labs    09/25/19 0854  DDIMER 1.58*   Hgb A1c No results for input(s): HGBA1C in the last 72 hours. Lipid Profile No results for input(s): CHOL, HDL, LDLCALC, TRIG, CHOLHDL, LDLDIRECT in the last 72 hours. Thyroid function studies No results for input(s): TSH, T4TOTAL, T3FREE, THYROIDAB in the last 72 hours.  Invalid input(s): FREET3 Anemia work up No results for input(s): VITAMINB12, FOLATE, FERRITIN, TIBC, IRON, RETICCTPCT in the last 72 hours. Urinalysis    Component Value Date/Time   BILIRUBINUR negative 12/11/2017 1507   KETONESUR negative 12/11/2017 1507   PROTEINUR trace (A) 12/11/2017 1507   UROBILINOGEN 1.0 12/11/2017 1507   NITRITE Negative 12/11/2017 1507   LEUKOCYTESUR Negative 12/11/2017 1507   Sepsis Labs Invalid input(s): PROCALCITONIN,  WBC,  LACTICIDVEN Microbiology Recent Results (from the past 240 hour(s))  SARS Coronavirus 2 Ag (30 min TAT) - Nasal Swab (BD Veritor Kit)     Status: None   Collection Time: 09/25/19  9:20 AM   Specimen: Nasal Swab (BD Veritor Kit)  Result Value Ref Range Status   SARS Coronavirus 2 Ag NEGATIVE NEGATIVE Final    Comment: (NOTE) SARS-CoV-2 antigen NOT DETECTED.  Negative results are presumptive.  Negative results do not preclude SARS-CoV-2 infection and should not be used as the sole basis for treatment or other patient management decisions, including infection  control decisions, particularly in  the presence of clinical signs and  symptoms consistent with COVID-19, or in those who have been in contact with the virus.  Negative results must be combined with clinical observations, patient history, and epidemiological information. The expected result is Negative. Fact Sheet for Patients: PodPark.tn Fact Sheet for Healthcare Providers: GiftContent.is This test is not yet  approved or cleared by the [place] FDA and  has been authorized for detection and/or diagnosis of SARS-CoV-2 by FDA under an Emergency Use Authorization (EUA).  This EUA will remain in effect (meaning this test can be used) for the duration of  the COVID-19 de claration under Section 564(b)(1) of the Act, 21 U.S.C. section 360bbb-3(b)(1), unless the authorization is terminated or revoked sooner. Performed at [facility], [address] [place], [place] [address]   SARS CORONAVIRUS 2 (TAT 6-24 HRS) Nasopharyngeal Nasopharyngeal Swab     Status: None   Collection Time: 09/25/19 11:19 AM   Specimen: Nasopharyngeal Swab  Result Value Ref Range Status   SARS Coronavirus 2 NEGATIVE NEGATIVE Final    Comment: (NOTE) SARS-CoV-2 target nucleic acids are NOT DETECTED. The SARS-CoV-2 RNA is generally detectable in upper and lower respiratory specimens during the acute phase of infection. Negative results do not preclude SARS-CoV-2 infection, do not rule out co-infections with other pathogens, and should not be used as the sole basis for treatment or other patient management decisions. Negative results must be combined with clinical observations, patient history, and epidemiological information. The expected result is Negative. Fact Sheet for Patients: SugarRoll.be Fact Sheet for Healthcare Providers: https://www.woods-mathews.com/ This test is not yet approved or cleared by the [place] FDA and  has been authorized for detection and/or diagnosis of SARS-CoV-2 by FDA under an Emergency Use Authorization (EUA). This EUA will remain  in effect (meaning this test can be used) for the duration of the COVID-19 declaration under Section 56 4(b)(1) of the Act, 21 U.S.C. section 360bbb-3(b)(1), unless the authorization is terminated or revoked sooner. Performed at [facility], [place] [address] [place], [place] [address]       Time coordinating discharge:  I have spent 35 minutes face to face with the patient and on the ward discussing the patients care, assessment, plan and disposition with other care givers. >50% of the time was devoted counseling the patient about the risks and benefits of treatment/Discharge disposition and coordinating care.   SIGNED:   Damita Lack, MD  [facility] 09/27/2019, 10:36 AM   If 7PM-7AM, please contact night-coverage

## 2019-09-27 NOTE — TOC Transition Note (Signed)
Transition of Care [facility]) - CM/SW Discharge Note   Patient Details  Name: Jeremiah Walker MRN: [id number] Date of Birth: Nov 07, 1927  [facility] CM/SW Contact:  Pollie Friar, RN Phone Number: 09/27/2019, 11:52 AM   Clinical Narrative:    Pt discharging to his ILF at [facility]. Pt has transportation via his son.  CM consulted for home oxygen but per bedside RN the MD feels he doesn't currently need it with ambulatory sats 88% and they will adjust his medications.  No further needs per CM.   Final next level of care: Home/Self Care Barriers to Discharge: No Barriers Identified   Patient Goals and CMS Choice        Discharge Placement                       Discharge Plan and Services                                     Social Determinants of Health (SDOH) Interventions     Readmission Risk Interventions No flowsheet data found.

## 2019-09-27 NOTE — Progress Notes (Signed)
SATURATION QUALIFICATIONS: (This note is used to comply with regulatory documentation for home oxygen)  Patient Saturations on Room Air at Rest = 95%  Patient Saturations on Room Air while Ambulating = 88%  Patient Saturations on 2 Liters of oxygen while Ambulating = 98%  Please briefly explain why patient needs home oxygen: Pt desat while ambulating

## 2019-09-28 ENCOUNTER — Telehealth: Payer: Self-pay | Admitting: *Deleted

## 2019-09-28 NOTE — Telephone Encounter (Signed)
1st attempt. Unable to reach patient. LVM for pt to call office to schedule hospital follow up appointment.   

## 2019-09-29 DIAGNOSIS — M545 Low back pain: Secondary | ICD-10-CM | POA: Diagnosis not present

## 2019-09-29 DIAGNOSIS — M6281 Muscle weakness (generalized): Secondary | ICD-10-CM | POA: Diagnosis not present

## 2019-09-29 DIAGNOSIS — R2689 Other abnormalities of gait and mobility: Secondary | ICD-10-CM | POA: Diagnosis not present

## 2019-09-29 DIAGNOSIS — R293 Abnormal posture: Secondary | ICD-10-CM | POA: Diagnosis not present

## 2019-09-29 DIAGNOSIS — M5126 Other intervertebral disc displacement, lumbar region: Secondary | ICD-10-CM | POA: Diagnosis not present

## 2019-09-29 NOTE — Telephone Encounter (Signed)
I have made two attempts and have been unable to reach patient. I have mailed the patient a letter requesting they call the office to schedule a hospital follow up appointment.   

## 2019-10-01 DIAGNOSIS — M545 Low back pain: Secondary | ICD-10-CM | POA: Diagnosis not present

## 2019-10-01 DIAGNOSIS — M6281 Muscle weakness (generalized): Secondary | ICD-10-CM | POA: Diagnosis not present

## 2019-10-01 DIAGNOSIS — R293 Abnormal posture: Secondary | ICD-10-CM | POA: Diagnosis not present

## 2019-10-01 DIAGNOSIS — M5126 Other intervertebral disc displacement, lumbar region: Secondary | ICD-10-CM | POA: Diagnosis not present

## 2019-10-01 DIAGNOSIS — R2689 Other abnormalities of gait and mobility: Secondary | ICD-10-CM | POA: Diagnosis not present

## 2019-10-04 NOTE — Progress Notes (Signed)
[place] [facility] [address], [place], [place] [address] [phone]  Date:  10/05/2019   Name:  Jeremiah Walker   DOB:  17-Mar-1928   MRN:  [id number]  PCP:  [name], MD    Chief Complaint: Hospitalization Follow-up (edema, lasix rx refilled)   History of Present Illness:  Jeremiah Walker is a 86 y.o. very pleasant male patient who presents with the following:  Elderly gentleman here today for hospital follow-up visit History of spinal stenosis with neurogenic claudication and chronic pain, peripheral neuropathy, chronic kidney disease, CML, CHF He was admitted for 2 nights  He denies any CP, he is feeling much better and is basically back to normal at this time  Admit date: 09/25/2019 Discharge date: 09/27/2019 Recommendations for Outpatient Follow-up:  1. Follow up with PCP in 1-2 weeks 2. Please obtain BMP/CBC in one week your next doctors visit.  3. Take Lasix 40 mg orally tomorrow on 4/5 and on 4/6.  Potassium supplements for 2 days has been given.  Thereafter take Lasix 40 mg orally daily as needed for weight gain, lower extremity swelling or shortness of breath.  Advised him that if he has to do this periodically, he needs to follow-up with his cardiologist and notify them.  Regardless of how he does over the next several days he still needs to follow-up with cardiologist in 3-4 weeks  Brief/Interim Summary: 86 year old with history of HTN, CHF EF 60% 10/20, diffuse B-cell lymphoma, CML, HLD, CAD, CKD stage IIIa presents with progressive dyspnea for the past 10 days. Admitted for CHF exacerbation, CTA showed moderate bilateral pleural effusion. Started on diuretics Over the course of couple of days in the hospital patient was diuresed with IV Lasix, symptomatically he started feeling much better where he was able to take deep breaths.  He denied any shortness of breath or any complaints at all.  On the day of  discharge with ambulation he was getting slightly hypoxic down to 88% with quick recovery.  He still wanted to go home therefore I requested him to take Lasix 40 mg orally tomorrow and the day after with potassium supplements thereafter he can transition Lasix to as needed.  Given instructions how and when to take it and follow-up outpatient cardiology. Currently medically stable to be discharged.  Dyspnea on exertion, resolved Acute congestive heart failure with preserved ejection fraction, 60%, class I Moderate bilateral pleural effusions -Echocardiogram here showed EF of 60%.  His shortness of breath is completely resolved and diuresed well with IV Lasix.  Will transition to p.o. Lasix 40 mg to be taken daily for next 2 days and thereafter transition to as needed.  Potassium supplements have been given. -Advised to check daily weight and monitor his symptoms. -Follow-up outpatient cardiology next 3-4 weeks. Elevated troponin -Likely demand ischemia, EKG without any acute changes.  Stress test 6 months ago showed fixed defect and was seen by cardiology. CKD stage IIIb -Baseline creatinine 1.8. Continue diuresis and monitor this. 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 Routinely receives Aranespfrom Dr Marin Olp Hyperlipidemia -History of rhabdomyolysis with statin. Currently on fish oil Insomnia/anxiety -Bedtime Ativan Thrombocytopenia, chronic -No evidence of bleeding. 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   He does sleep propped up- this is no change  He does use an adjustable bed  Wt Readings from Last 3 Encounters:  10/05/19 203 lb (92.1 kg)  09/27/19 193 lb 8 oz (87.8 kg)  09/17/19 211 lb 12.8 oz (96.1 kg)    Patient Active Problem List   Diagnosis Date Noted  . Acute exacerbation of CHF (congestive heart failure) ([place]) 09/25/2019  . Elevated troponin 09/25/2019  . Spondylosis without myelopathy or radiculopathy, lumbar region 08/12/2019  . Spinal stenosis of lumbar region with neurogenic claudication 08/12/2019  . Myofascial pain syndrome 08/12/2019  . Chest pain 03/26/2019  . 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 Chronic pain syndrome 09/26/2015  . De Quervain's tenosynovitis, right 09/13/2015  . Anemia due to antineoplastic chemotherapy 08/15/2015  . DLBCL (diffuse large B cell lymphoma) ([place]) 08/15/2015  . Skin lesion of face 05/21/2015  . Peripheral neuropathy due to chemotherapy ([place]) 02/09/2015  . Benign prostatic hyperplasia with urinary obstruction 02/09/2015  . Bilateral hearing loss 02/09/2015  . Low back pain 02/09/2015  . CKD (chronic kidney disease), stage III 02/09/2015  . Essential (primary) hypertension 02/09/2015  . Gastro-esophageal reflux disease without esophagitis 02/09/2015  . Anxiety, generalized 02/09/2015  . Cannot sleep 02/09/2015  . Combined fat and carbohydrate induced hyperlipemia 02/09/2015    Past  Medical History:  Diagnosis Date  . Acute exacerbation of CHF (congestive heart failure) ([place]) 09/25/2019  . Anemia   . Anemia due to antineoplastic chemotherapy 08/15/2015  . Anxiety   . Arthritis   . Cataract   . Chronic kidney disease (CKD), stage III (moderate) 02/09/2015   Controlled  . CMML (chronic myelomonocytic leukemia) ([place])    gets Aranesp about monthly, otherwise surveillance with Dr. Marin Olp  . Combined fat and carbohydrate induced hyperlipemia 02/09/2015   Controlled  . Depression   . DLBCL (diffuse large B cell lymphoma) ([place]) 08/15/2015  . Essential (primary) hypertension 02/09/2015  . Gastro-esophageal reflux disease without esophagitis 02/09/2015   Controlled  . Renal insufficiency   . Tinnitus     Past Surgical History:  Procedure Laterality Date  . CATARACT EXTRACTION, BILATERAL    . COLONOSCOPY  May 2011  . CYSTOSCOPY    . TOTAL HIP ARTHROPLASTY  2003   Right   . TOTAL HIP ARTHROPLASTY  April, 2011   Left  . TRIGGER FINGER RELEASE  Nov 2013  . TURBINECTOMY  2006  . UPPER GI ENDOSCOPY  Nov 2015    Social History   Tobacco Use  . Smoking status: Former Research scientist (life sciences)  . Smokeless tobacco: Never Used  . Tobacco comment: Quit >50 years ago  Substance Use Topics  . Alcohol use: Yes    Alcohol/week: 1.0 -  4.0 standard drinks    Types: 1 - 4 Shots of liquor per week    Comment: 4 glasses per week  . Drug use: No    Family History  Problem Relation Age of Onset  . Anuerysm Mother 77       Deceased  . Colon cancer Father 52       Deceased  . Colon cancer Brother   . Prostate cancer Brother        Deceased  . Alzheimer's disease Brother   . Heart disease Brother   . Alzheimer's disease Paternal Grandmother   . Early death Maternal Grandmother        Unknown  . Obesity Son   . Obesity Son        Gastric Bypass  . Healthy Son   . Healthy Daughter   . Diabetes Neg Hx     Allergies  Allergen Reactions  . Nitroglycerin Other (See Comments)    "I  ended up in a fetal position from the pain"  . Nsaids Other (See Comments) and Tinitus    Bruising   . Statins Nausea And Vomiting and Other (See Comments)    Kidney issues and muscle pain   . Tolmetin Other (See Comments)    Bruising  . Ciprofloxacin Other (See Comments)    Achilles tendon issues  . Ropinirole Anxiety and Other (See Comments)    Mood swings, also  . Requip [Ropinirole Hcl] Anxiety    Medication list has been reviewed and updated.  Current Outpatient Medications on File Prior to Visit  Medication Sig Dispense Refill  . allopurinol (ZYLOPRIM) 100 MG tablet TAKE 2 TABLETS(200 MG) BY MOUTH DAILY (Patient taking differently: Take 200 mg by mouth daily with breakfast. ) 180 tablet 3  . ALPRAZolam (XANAX) 0.5 MG tablet TAKE 1 TABLET(0.5 MG) BY MOUTH THREE TIMES DAILY AS NEEDED FOR ANXIETY (Patient taking differently: Take 1 mg by mouth at bedtime. ) 90 tablet 3  . amoxicillin (AMOXIL) 500 MG capsule Take 4 caps (2gm) by mouth once prior to dental procedure (Patient taking differently: Take 2,000 mg by mouth See admin instructions. Take 2,000 mg by mouth one hour prior to dental procedures) 20 capsule 2  . azelastine (ASTELIN) 0.1 % nasal spray Place 2 sprays into both nostrils 2 (two) times daily. Use in each nostril as directed (Patient taking differently: Place 2 sprays into both nostrils 2 (two) times daily as needed (for seasonal allergies). ) 30 mL 11  . b complex vitamins tablet Take 1 tablet by mouth daily.    . baclofen (LIORESAL) 10 MG tablet Take 1/2 to 1 by mouth every 8hrs as needed for spasm (Patient taking differently: Take 5-10 mg by mouth every 8 (eight) hours as needed for muscle spasms. ) 60 tablet 0  . bethanechol (URECHOLINE) 25 MG tablet TAKE 1 TABLET(25 MG) BY MOUTH THREE TIMES DAILY (Patient taking differently: Take 25 mg by mouth 3 (three) times daily. ) 90 tablet 11  . Calcium 500 MG tablet Take 500 mg by mouth daily.     . Cholecalciferol (VITAMIN D3)  2000 UNITS capsule Take 2,000 Units by mouth daily.    . Cyanocobalamin (VITAMIN B-12 PO) Take 1 tablet by mouth daily after breakfast.    . folic acid (FOLVITE) 1 MG tablet TAKE 2 TABLETS(2 MG) BY MOUTH DAILY 60 tablet 4  . furosemide (LASIX) 40 MG tablet Take 1 tablet (40 mg total) by mouth daily as needed for fluid or  edema. 30 tablet 0  . HYDROcodone-acetaminophen (NORCO) 7.5-325 MG tablet Take 1 tablet by mouth every 6 (six) hours as needed for moderate pain. 100 tablet 0  . Magnesium Oxide 200 MG TABS Take 200 mg by mouth daily.     . Melatonin 10 MG TABS Take 20 mg by mouth at bedtime.     . Multiple Vitamin (MULTIVITAMIN) tablet Take 1 tablet by mouth daily.    . Omega-3 Fatty Acids (OMEGA-3 FISH OIL PO) Take 1 capsule by mouth daily. EPA/DHA 520/350     . Polyethyl Glyc-Propyl Glyc PF (SYSTANE ULTRA PF) 0.4-0.3 % SOLN Place 1-2 drops into both eyes 3 (three) times daily as needed (for dryness).     . polyethylene glycol (MIRALAX / GLYCOLAX) packet Take 17 g by mouth daily as needed (into 6-8 ounces of water and drink (in conjunction with Metamucil) once a day only when taking Norco).     . predniSONE (DELTASONE) 20 MG tablet May take 1 tablet daily for up to 3 days as needed for gout attack or muscle spasm (Patient taking differently: Take 20 mg by mouth See admin instructions. Take 20 mg by mouth once a day for up to three days, as directed/as needed for gout attacks or muscle spasms/pain flares) 20 tablet 1  . pregabalin (LYRICA) 25 MG capsule Take 1 capsule (25 mg total) by mouth 3 (three) times daily. 90 capsule 11  . Psyllium (METAMUCIL PO) Take by mouth See admin instructions. Mix 1 teaspoonful into 6-8 ounces of water and drink (in conjunction with Miralax) once a day only when taking Norco    . Pyridoxine HCl (VITAMIN B-6 PO) Take 1 tablet by mouth daily after breakfast.    . vitamin C (ASCORBIC ACID) 500 MG tablet Take 1,000 mg by mouth daily.     Marland Kitchen zinc gluconate 50 MG tablet Take  50 mg by mouth daily.    . potassium chloride 20 MEQ TBCR Take 40 mEq by mouth daily for 2 days. 4 tablet 0   No current facility-administered medications on file prior to visit.    Review of Systems:  As per HPI- otherwise negative.  He is quite active, enjoys swimming and physical therapy at his assisted living facility Physical Examination: Vitals:   10/05/19 1617  BP: (!) 148/71  Pulse: 90  Resp: 19  Temp: (!) 97.4 F (36.3 C)  SpO2: 97%   Vitals:   10/05/19 1617  Weight: 203 lb (92.1 kg)  Height: 5' 9.5" (1.765 m)   Body mass index is 29.55 kg/m. Ideal Body Weight: Weight in (lb) to have BMI = 25: 171.4  GEN: no acute distress.  Central obesity, appears well in his normal self HEENT: Atraumatic, Normocephalic.  Ears and Nose: No external deformity. CV: systolic murmur from mitral valve, No M/G/R. No JVD. No thrill. No extra heart sounds. PULM: CTA B, no wheezes, crackles, rhonchi. No retractions. No resp. distress. No accessory muscle use. ABD: S, NT, ND, +BS. No rebound. No HSM. EXTR: No c/c/e PSYCH: Normally interactive. Conversant.    Assessment and Plan: Hospital discharge follow-up - Plan: CBC, Basic metabolic panel  Edema, unspecified type - Plan: CBC, Basic metabolic panel  Chronic pain syndrome - Plan: HYDROcodone-acetaminophen (NORCO) 7.5-325 MG tablet  Following up from recent hospitalization with CHF exacerbation He has cardiology follow-up tomorrow.  Weight appears stable, lungs are clear.  Will obtain CBC and BMP today He does use chronic hydrocodone for spinal stenosis, chronic back and leg  pain.  Due for refill today Will plan further follow- up pending labs. Moderate medical decision making today This visit occurred during the SARS-CoV-2 public health emergency.  Safety protocols were in place, including screening questions prior to the visit, additional usage of staff PPE, and extensive cleaning of exam room while observing appropriate contact  time as indicated for disinfecting solutions.    Signed Lamar Blinks, MD

## 2019-10-05 ENCOUNTER — Encounter: Payer: Self-pay | Admitting: Family Medicine

## 2019-10-05 ENCOUNTER — Ambulatory Visit (INDEPENDENT_AMBULATORY_CARE_PROVIDER_SITE_OTHER): Payer: Medicare Other | Admitting: Family Medicine

## 2019-10-05 ENCOUNTER — Other Ambulatory Visit: Payer: Self-pay

## 2019-10-05 ENCOUNTER — Ambulatory Visit: Payer: Medicare Other | Admitting: Family Medicine

## 2019-10-05 ENCOUNTER — Telehealth: Payer: Self-pay | Admitting: Physical Medicine and Rehabilitation

## 2019-10-05 VITALS — BP 148/71 | HR 90 | Temp 97.4°F | Resp 19 | Ht 69.5 in | Wt 203.0 lb

## 2019-10-05 DIAGNOSIS — G894 Chronic pain syndrome: Secondary | ICD-10-CM | POA: Diagnosis not present

## 2019-10-05 DIAGNOSIS — Z09 Encounter for follow-up examination after completed treatment for conditions other than malignant neoplasm: Secondary | ICD-10-CM

## 2019-10-05 DIAGNOSIS — R609 Edema, unspecified: Secondary | ICD-10-CM

## 2019-10-05 DIAGNOSIS — M545 Low back pain: Secondary | ICD-10-CM | POA: Diagnosis not present

## 2019-10-05 DIAGNOSIS — M6281 Muscle weakness (generalized): Secondary | ICD-10-CM | POA: Diagnosis not present

## 2019-10-05 DIAGNOSIS — M5126 Other intervertebral disc displacement, lumbar region: Secondary | ICD-10-CM | POA: Diagnosis not present

## 2019-10-05 DIAGNOSIS — R2689 Other abnormalities of gait and mobility: Secondary | ICD-10-CM | POA: Diagnosis not present

## 2019-10-05 DIAGNOSIS — R293 Abnormal posture: Secondary | ICD-10-CM | POA: Diagnosis not present

## 2019-10-05 MED ORDER — HYDROCODONE-ACETAMINOPHEN 7.5-325 MG PO TABS: 1.0000 | ORAL_TABLET | Freq: Four times a day (QID) | ORAL | 0 refills | Status: DC | PRN

## 2019-10-05 NOTE — Telephone Encounter (Signed)
OV, seriously

## 2019-10-05 NOTE — Progress Notes (Signed)
Cardiology Office Note:    Date:  10/06/2019   ID:  Jeremiah Walker, DOB 23-Aug-1927, MRN [id number]  PCP:  [name], MD  Cardiologist:  No primary care provider on file.  Electrophysiologist:  None   Referring MD: [name], MD   Chief Complaint  Patient presents with  . Follow-up    Post hospital.    History of Present Illness:    Jeremiah Walker is a 86 y.o. male with a hx of CML, CKD stage III, hypertension who presents for follow-up.  He was admitted to [facility] for chest pain on 03/26/2019.  He reported that he had been doing water aerobics and while he had no chest pain while exercising, that evening he was doing stretches on the floor and began having substernal chest pain.  Pain lasted from 9 PM that night until 7 AM the following morning.  EKG showed no acute ST/T abnormalities.  Labs showed mild elevation in high-sensitivity troponin (56->66).  He had a CTPA which showed no evidence of PE but did note coronary artery atherosclerosis.  Nuclear stress test was done, which showed no ischemia but did show a large fixed defect suggestive of prior infarct.  TTE was done which showed normal ejection fraction, suggesting perfusion defect on stress test was likely artifact.  TTE was also notable for moderate aortic stenosis.    Since last clinic visit, he was admitted to [facility] from 4/2 through 09/27/2019 with progressive dyspnea.  CTA chest showed moderate bilateral pleural effusions.  BNP 722 on 4/2.  TTE showed LVEF 55 to 46%, grade 1 diastolic dysfunction, normal RV systolic function, moderate aortic stenosis.  He was admitted for CHF exacerbation and diuresed with IV Lasix, with significant provement in his symptoms.  He was discharged on as needed Lasix.  Weight 205 lbs today, down from 207 lbs at last clinic visit.  Reports that his weight was 196 on discharge from hospital, and took Lasix yesterday because was up to 201 pounds.  He was instructed to take Lasix risk any more  than 2 pounds in a day.  States that symptomatically significantly improved, states that he feels his dyspnea is back to normal.  He denies any chest pain.  Started doing physical therapy.    Past Medical History:  Diagnosis Date  . Acute exacerbation of CHF (congestive heart failure) ([place]) 09/25/2019  . Anemia   . Anemia due to antineoplastic chemotherapy 08/15/2015  . Anxiety   . Arthritis   . Cataract   . Chronic kidney disease (CKD), stage III (moderate) 02/09/2015   Controlled  . CMML (chronic myelomonocytic leukemia) ([place])    gets Aranesp about monthly, otherwise surveillance with Dr. Marin Olp  . Combined fat and carbohydrate induced hyperlipemia 02/09/2015   Controlled  . Depression   . DLBCL (diffuse large B cell lymphoma) ([place]) 08/15/2015  . Essential (primary) hypertension 02/09/2015  . Gastro-esophageal reflux disease without esophagitis 02/09/2015   Controlled  . Renal insufficiency   . Tinnitus     Past Surgical History:  Procedure Laterality Date  . CATARACT EXTRACTION, BILATERAL    . COLONOSCOPY  May 2011  . CYSTOSCOPY    . TOTAL HIP ARTHROPLASTY  2003   Right   . TOTAL HIP ARTHROPLASTY  April, 2011   Left  . TRIGGER FINGER RELEASE  Nov 2013  . TURBINECTOMY  2006  . UPPER GI ENDOSCOPY  Nov 2015    Current Medications: Current Meds  Medication Sig  .  allopurinol (ZYLOPRIM) 100 MG tablet TAKE 2 TABLETS(200 MG) BY MOUTH DAILY (Patient taking differently: Take 200 mg by mouth daily with breakfast. )  . ALPRAZolam (XANAX) 0.5 MG tablet TAKE 1 TABLET(0.5 MG) BY MOUTH THREE TIMES DAILY AS NEEDED FOR ANXIETY (Patient taking differently: Take 1 mg by mouth at bedtime. )  . amoxicillin (AMOXIL) 500 MG capsule Take 4 caps (2gm) by mouth once prior to dental procedure (Patient taking differently: Take 2,000 mg by mouth See admin instructions. Take 2,000 mg by mouth one hour prior to dental procedures)  . azelastine (ASTELIN) 0.1 % nasal spray Place 2 sprays into both nostrils  2 (two) times daily. Use in each nostril as directed (Patient taking differently: Place 2 sprays into both nostrils 2 (two) times daily as needed (for seasonal allergies). )  . b complex vitamins tablet Take 1 tablet by mouth daily.  . baclofen (LIORESAL) 10 MG tablet Take 1/2 to 1 by mouth every 8hrs as needed for spasm (Patient taking differently: Take 5-10 mg by mouth every 8 (eight) hours as needed for muscle spasms. )  . bethanechol (URECHOLINE) 25 MG tablet TAKE 1 TABLET(25 MG) BY MOUTH THREE TIMES DAILY (Patient taking differently: Take 25 mg by mouth 3 (three) times daily. )  . Calcium 500 MG tablet Take 500 mg by mouth daily.   . Cholecalciferol (VITAMIN D3) 2000 UNITS capsule Take 2,000 Units by mouth daily.  . Cyanocobalamin (VITAMIN B-12 PO) Take 1 tablet by mouth daily after breakfast.  . folic acid (FOLVITE) 1 MG tablet TAKE 2 TABLETS(2 MG) BY MOUTH DAILY  . furosemide (LASIX) 40 MG tablet Take 1 tablet (40 mg total) by mouth daily as needed for fluid or edema.  Marland Kitchen HYDROcodone-acetaminophen (NORCO) 7.5-325 MG tablet Take 1 tablet by mouth every 6 (six) hours as needed for moderate pain.  . Magnesium Oxide 200 MG TABS Take 200 mg by mouth daily.   . Melatonin 10 MG TABS Take 20 mg by mouth at bedtime.   . Multiple Vitamin (MULTIVITAMIN) tablet Take 1 tablet by mouth daily.  . Omega-3 Fatty Acids (OMEGA-3 FISH OIL PO) Take 1 capsule by mouth daily. EPA/DHA 520/350   . Polyethyl Glyc-Propyl Glyc PF (SYSTANE ULTRA PF) 0.4-0.3 % SOLN Place 1-2 drops into both eyes 3 (three) times daily as needed (for dryness).   . polyethylene glycol (MIRALAX / GLYCOLAX) packet Take 17 g by mouth daily as needed (into 6-8 ounces of water and drink (in conjunction with Metamucil) once a day only when taking Norco).   . predniSONE (DELTASONE) 20 MG tablet May take 1 tablet daily for up to 3 days as needed for gout attack or muscle spasm (Patient taking differently: Take 20 mg by mouth See admin instructions.  Take 20 mg by mouth once a day for up to three days, as directed/as needed for gout attacks or muscle spasms/pain flares)  . pregabalin (LYRICA) 25 MG capsule Take 1 capsule (25 mg total) by mouth 3 (three) times daily.  . Psyllium (METAMUCIL PO) Take by mouth See admin instructions. Mix 1 teaspoonful into 6-8 ounces of water and drink (in conjunction with Miralax) once a day only when taking Norco  . Pyridoxine HCl (VITAMIN B-6 PO) Take 1 tablet by mouth daily after breakfast.  . vitamin C (ASCORBIC ACID) 500 MG tablet Take 1,000 mg by mouth daily.   Marland Kitchen zinc gluconate 50 MG tablet Take 50 mg by mouth daily.     Allergies:   Nitroglycerin, Nsaids,  Statins, Tolmetin, Ciprofloxacin, Ropinirole, and Requip [ropinirole hcl]   Social History   Socioeconomic History  . Marital status: Widowed    Spouse name: Not on file  . Number of children: Not on file  . Years of education: Not on file  . Highest education level: Not on file  Occupational History  . Not on file  Tobacco Use  . Smoking status: Former Research scientist (life sciences)  . Smokeless tobacco: Never Used  . Tobacco comment: Quit >50 years ago  Substance and Sexual Activity  . Alcohol use: Yes    Alcohol/week: 1.0 - 4.0 standard drinks    Types: 1 - 4 Shots of liquor per week    Comment: 4 glasses per week  . Drug use: No  . Sexual activity: Not on file  Other Topics Concern  . Not on file  Social History Narrative  . Not on file   Social Determinants of Health   Financial Resource Strain:   . Difficulty of Paying Living Expenses:   Food Insecurity:   . Worried About Charity fundraiser in the Last Year:   . Arboriculturist in the Last Year:   Transportation Needs:   . Film/video editor (Medical):   Marland Kitchen Lack of Transportation (Non-Medical):   Physical Activity:   . Days of Exercise per Week:   . Minutes of Exercise per Session:   Stress:   . Feeling of Stress :   Social Connections:   . Frequency of Communication with Friends and  Family:   . Frequency of Social Gatherings with Friends and Family:   . Attends Religious Services:   . Active Member of Clubs or Organizations:   . Attends Archivist Meetings:   Marland Kitchen Marital Status:      Family History: The patient's family history includes Alzheimer's disease in his brother and paternal grandmother; Anuerysm (age of onset: 7) in his mother; Colon cancer in his brother; Colon cancer (age of onset: 41) in his father; Early death in his maternal grandmother; Healthy in his daughter and son; Heart disease in his brother; Obesity in his son and son; Prostate cancer in his brother. There is no history of Diabetes.  ROS:   Please see the history of present illness.     All other systems reviewed and are negative.  EKGs/Labs/Other Studies Reviewed:    The following studies were reviewed today:   EKG:  EKG is ordered today and shows normal sinus rhythm, rate 76, poor R wave progression  Stress MPI 03/26/19:  There was no ST segment deviation noted during stress.  No T wave inversion was noted during stress.  Defect 1: There is a large defect of moderate severity present in the basal anteroseptal, basal inferoseptal, basal inferior, basal inferolateral, basal anterolateral, mid inferoseptal, mid inferior, mid inferolateral, apical inferior and apical lateral location.  Findings consistent with prior myocardial infarction.  The left ventricular ejection fraction is mildly decreased (45-54%).  This is a high risk study.  No prior for comparison.   Large, severe fixed defect primarily in inferolateral/inferior walls and extending to adjacent walls, especially at base. Reduced EF with matching wall motion abnormalities. Consistent with infarct. No reversible ischemia seen.  TTE 03/27/19:  1. Left ventricular ejection fraction, by visual estimation, is 60 to 65%. The left ventricle has normal function. There is moderately increased left ventricular hypertrophy.   2. Elevated left atrial and left ventricular end-diastolic pressures.  3. Left ventricular diastolic Doppler parameters are  consistent with impaired relaxation pattern of LV diastolic filling.  4. Global right ventricle has normal systolic function.The right ventricular size is normal. No increase in right ventricular wall thickness.  5. Left atrial size was moderately dilated.  6. Right atrial size was normal.  7. Moderate mitral annular calcification.  8. The mitral valve is abnormal. Mild mitral valve regurgitation.  9. The tricuspid valve is grossly normal. Tricuspid valve regurgitation is trivial. 10. The aortic valve is tricuspid Aortic valve regurgitation was not visualized by color flow Doppler. Moderate aortic valve stenosis. 11. The pulmonic valve was grossly normal. Pulmonic valve regurgitation is trivial by color flow Doppler.   Recent Labs: 09/25/2019: ALT 14; B Natriuretic Peptide 722.0 09/27/2019: Magnesium 2.0 10/05/2019: BUN 26; Creatinine, Ser 1.72; Hemoglobin 9.6; Platelets 93.0; Potassium 4.4; Sodium 137  Recent Lipid Panel    Component Value Date/Time   CHOL 141 03/26/2019 0055   TRIG 69 03/26/2019 0055   HDL 41 03/26/2019 0055   CHOLHDL 3.4 03/26/2019 0055   VLDL 14 03/26/2019 0055   LDLCALC 86 03/26/2019 0055    Physical Exam:    VS:  BP 130/68 (BP Location: Right Arm, Patient Position: Sitting, Cuff Size: Large)   Pulse 76   Temp (!) 97 F (36.1 C)   Ht 5' 9.5" (1.765 m)   Wt 205 lb (93 kg)   BMI 29.84 kg/m     Wt Readings from Last 3 Encounters:  10/06/19 205 lb (93 kg)  10/05/19 203 lb (92.1 kg)  09/27/19 193 lb 8 oz (87.8 kg)     GEN:  in no acute distress HEENT: Normal NECK: No JVD LYMPHATICS: No lymphadenopathy CARDIAC: RRR, 3/6 systolic murmur loudest at RUSB RESPIRATORY: Bibasilar crackles ABDOMEN: Soft, non-tender, non-distended MUSCULOSKELETAL:  No edema; No deformity  SKIN: Warm and dry NEUROLOGIC:  Alert and oriented x  3 PSYCHIATRIC:  Normal affect   ASSESSMENT:    1. Chronic diastolic heart failure ([place])   2. Aortic valve stenosis, etiology of cardiac valve disease unspecified   3. Medication management   4. Essential hypertension   5. Hyperlipidemia, unspecified hyperlipidemia type   6. Chronic renal failure, stage 3b    PLAN:     Chronic diastolic heart failure: Recent admission for decompensated heart failure, responded well to IV Lasix.  Discharged on as needed p.o. Lasix.  Bibasilar crackles on exam, but no JVD or LE edema noted.  He reports feeling back to normal.  Will check BMP, magnesium, BNP  Aortic stenosis: moderate AS, will follow with annual echo  Chest pain:atypical symptoms.  Stress MPI shows fixed defect, mild systolic dysfunction.  TTE showed normal EF, suggesting likely artifact on stress MPI  HTN:not on any agents currently. Blood pressures stable  HLD: developed rhabo while on statin therapy in the past. LDL 85 without current therapy.   JEH:UDJS anemia and thrombocytopenia. Followed by Dr. Marin Olp through [facility]. On aranesp q3 weeks.   CKD IIIb:Cr 1.8 on 09/27/2019.  Will check BMP as above  RTC in 3 months   Medication Adjustments/Labs and Tests Ordered: Current medicines are reviewed at length with the patient today.  Concerns regarding medicines are outlined above.  Orders Placed This Encounter  Procedures  . Basic metabolic panel  . Magnesium  . Brain natriuretic peptide  . EKG 12-Lead   No orders of the defined types were placed in this encounter.   Patient Instructions  Medication Instructions:  Your physician recommends that you continue on your  current medications as directed. Please refer to the Current Medication list given to you today.  *If you need a refill on your cardiac medications before your next appointment, please call your pharmacy*  Lab Work: TODAY (BMET, Mag, BNP)  If you have labs (blood work) drawn today and your tests  are completely normal, you will receive your results only by: Marland Kitchen MyChart Message (if you have MyChart) OR . A paper copy in the mail If you have any lab test that is abnormal or we need to change your treatment, we will call you to review the results.  Testing/Procedures: NONE   Follow-Up: At [facility], you and your health needs are our priority.  As part of our continuing mission to provide you with exceptional heart care, we have created designated Provider Care Teams.  These Care Teams include your primary Cardiologist (physician) and Advanced Practice Providers (APPs -  Physician Assistants and Nurse Practitioners) who all work together to provide you with the care you need, when you need it.  We recommend signing up for the patient portal called "MyChart".  Sign up information is provided on this After Visit Summary.  MyChart is used to connect with patients for Virtual Visits (Telemedicine).  Patients are able to view lab/test results, encounter notes, upcoming appointments, etc.  Non-urgent messages can be sent to your provider as well.   To learn more about what you can do with MyChart, go to [email or web address].    Your next appointment:   3 month(s)  The format for your next appointment:   In Person  Provider:   Oswaldo Milian, MD       Signed, Donato Heinz, MD  10/06/2019 1:10 PM    [place]

## 2019-10-05 NOTE — Telephone Encounter (Signed)
Patient left a message stating that he is still having a lot of pain in his quadratus muscle. He states that he has "been down since the first of the year" and "it's time we really figure out what's going on and get this muscle fixed." Please advise.

## 2019-10-05 NOTE — Patient Instructions (Addendum)
It was good to see you again today- I will be in touch with your lab results asap  Continue to take such good care of yourself Let me know if you have any questions or concerns about your weight and how to manage it

## 2019-10-06 ENCOUNTER — Encounter: Payer: Self-pay | Admitting: Family Medicine

## 2019-10-06 ENCOUNTER — Encounter: Payer: Self-pay | Admitting: Cardiology

## 2019-10-06 ENCOUNTER — Ambulatory Visit (INDEPENDENT_AMBULATORY_CARE_PROVIDER_SITE_OTHER): Payer: Medicare Other | Admitting: Cardiology

## 2019-10-06 VITALS — BP 130/68 | HR 76 | Temp 97.0°F | Ht 69.5 in | Wt 205.0 lb

## 2019-10-06 DIAGNOSIS — E785 Hyperlipidemia, unspecified: Secondary | ICD-10-CM | POA: Diagnosis not present

## 2019-10-06 DIAGNOSIS — N1832 Chronic kidney disease, stage 3b: Secondary | ICD-10-CM | POA: Diagnosis not present

## 2019-10-06 DIAGNOSIS — Z79899 Other long term (current) drug therapy: Secondary | ICD-10-CM

## 2019-10-06 DIAGNOSIS — I350 Nonrheumatic aortic (valve) stenosis: Secondary | ICD-10-CM | POA: Diagnosis not present

## 2019-10-06 DIAGNOSIS — I5032 Chronic diastolic (congestive) heart failure: Secondary | ICD-10-CM | POA: Diagnosis not present

## 2019-10-06 DIAGNOSIS — I10 Essential (primary) hypertension: Secondary | ICD-10-CM

## 2019-10-06 LAB — CBC
HCT: 29 % — ABNORMAL LOW (ref 39.0–52.0)
Hemoglobin: 9.6 g/dL — ABNORMAL LOW (ref 13.0–17.0)
MCHC: 33.2 g/dL (ref 30.0–36.0)
MCV: 117.6 fl — ABNORMAL HIGH (ref 78.0–100.0)
Platelets: 93 10*3/uL — ABNORMAL LOW (ref 150.0–400.0)
RBC: 2.46 Mil/uL — ABNORMAL LOW (ref 4.22–5.81)
RDW: 17.2 % — ABNORMAL HIGH (ref 11.5–15.5)
WBC: 4.9 10*3/uL (ref 4.0–10.5)

## 2019-10-06 LAB — BASIC METABOLIC PANEL
BUN: 26 mg/dL — ABNORMAL HIGH (ref 6–23)
CO2: 25 mEq/L (ref 19–32)
Calcium: 8.9 mg/dL (ref 8.4–10.5)
Chloride: 102 mEq/L (ref 96–112)
Creatinine, Ser: 1.72 mg/dL — ABNORMAL HIGH (ref 0.40–1.50)
GFR: 37.56 mL/min — ABNORMAL LOW (ref >60.00)
Glucose, Bld: 82 mg/dL (ref 70–99)
Potassium: 4.4 mEq/L (ref 3.5–5.1)
Sodium: 137 mEq/L (ref 135–145)

## 2019-10-06 NOTE — Patient Instructions (Signed)
Medication Instructions:  Your physician recommends that you continue on your current medications as directed. Please refer to the Current Medication list given to you today.  *If you need a refill on your cardiac medications before your next appointment, please call your pharmacy*  Lab Work: TODAY (BMET, Mag, BNP)  If you have labs (blood work) drawn today and your tests are completely normal, you will receive your results only by: Marland Kitchen MyChart Message (if you have MyChart) OR . A paper copy in the mail If you have any lab test that is abnormal or we need to change your treatment, we will call you to review the results.  Testing/Procedures: NONE   Follow-Up: At [facility], you and your health needs are our priority.  As part of our continuing mission to provide you with exceptional heart care, we have created designated Provider Care Teams.  These Care Teams include your primary Cardiologist (physician) and Advanced Practice Providers (APPs -  Physician Assistants and Nurse Practitioners) who all work together to provide you with the care you need, when you need it.  We recommend signing up for the patient portal called "MyChart".  Sign up information is provided on this After Visit Summary.  MyChart is used to connect with patients for Virtual Visits (Telemedicine).  Patients are able to view lab/test results, encounter notes, upcoming appointments, etc.  Non-urgent messages can be sent to your provider as well.   To learn more about what you can do with MyChart, go to [email or web address].    Your next appointment:   3 month(s)  The format for your next appointment:   In Person  Provider:   Oswaldo Milian, MD

## 2019-10-06 NOTE — Telephone Encounter (Signed)
Pt is scheduled for 10/20/19 at 9:30a

## 2019-10-07 ENCOUNTER — Inpatient Hospital Stay: Payer: Medicare Other

## 2019-10-07 ENCOUNTER — Inpatient Hospital Stay (HOSPITAL_BASED_OUTPATIENT_CLINIC_OR_DEPARTMENT_OTHER): Payer: Medicare Other | Admitting: Hematology & Oncology

## 2019-10-07 ENCOUNTER — Other Ambulatory Visit: Payer: Self-pay

## 2019-10-07 ENCOUNTER — Inpatient Hospital Stay: Payer: Medicare Other | Attending: Hematology & Oncology

## 2019-10-07 ENCOUNTER — Encounter: Payer: Self-pay | Admitting: Hematology & Oncology

## 2019-10-07 VITALS — BP 109/80 | HR 78 | Temp 97.7°F | Resp 19 | Ht 69.5 in | Wt 204.1 lb

## 2019-10-07 DIAGNOSIS — D6481 Anemia due to antineoplastic chemotherapy: Secondary | ICD-10-CM | POA: Diagnosis not present

## 2019-10-07 DIAGNOSIS — Z886 Allergy status to analgesic agent status: Secondary | ICD-10-CM | POA: Insufficient documentation

## 2019-10-07 DIAGNOSIS — R0601 Orthopnea: Secondary | ICD-10-CM | POA: Diagnosis not present

## 2019-10-07 DIAGNOSIS — Z79899 Other long term (current) drug therapy: Secondary | ICD-10-CM | POA: Insufficient documentation

## 2019-10-07 DIAGNOSIS — D508 Other iron deficiency anemias: Secondary | ICD-10-CM

## 2019-10-07 DIAGNOSIS — C8334 Diffuse large B-cell lymphoma, lymph nodes of axilla and upper limb: Secondary | ICD-10-CM

## 2019-10-07 DIAGNOSIS — R5383 Other fatigue: Secondary | ICD-10-CM | POA: Insufficient documentation

## 2019-10-07 DIAGNOSIS — Z888 Allergy status to other drugs, medicaments and biological substances status: Secondary | ICD-10-CM | POA: Insufficient documentation

## 2019-10-07 DIAGNOSIS — M549 Dorsalgia, unspecified: Secondary | ICD-10-CM | POA: Diagnosis not present

## 2019-10-07 DIAGNOSIS — C8330 Diffuse large B-cell lymphoma, unspecified site: Secondary | ICD-10-CM

## 2019-10-07 DIAGNOSIS — M7989 Other specified soft tissue disorders: Secondary | ICD-10-CM | POA: Diagnosis not present

## 2019-10-07 DIAGNOSIS — Z8572 Personal history of non-Hodgkin lymphomas: Secondary | ICD-10-CM | POA: Diagnosis not present

## 2019-10-07 DIAGNOSIS — Z881 Allergy status to other antibiotic agents status: Secondary | ICD-10-CM | POA: Diagnosis not present

## 2019-10-07 DIAGNOSIS — T451X5A Adverse effect of antineoplastic and immunosuppressive drugs, initial encounter: Secondary | ICD-10-CM

## 2019-10-07 DIAGNOSIS — C9310 Chronic myelomonocytic leukemia not having achieved remission: Secondary | ICD-10-CM

## 2019-10-07 LAB — CBC WITH DIFFERENTIAL (CANCER CENTER ONLY)
Abs Immature Granulocytes: 0.08 10*3/uL — ABNORMAL HIGH (ref 0.00–0.07)
Basophils Absolute: 0 10*3/uL (ref 0.0–0.1)
Basophils Relative: 1 %
Eosinophils Absolute: 0 10*3/uL (ref 0.0–0.5)
Eosinophils Relative: 1 %
HCT: 29.1 % — ABNORMAL LOW (ref 39.0–52.0)
Hemoglobin: 9.3 g/dL — ABNORMAL LOW (ref 13.0–17.0)
Immature Granulocytes: 2 %
Lymphocytes Relative: 27 %
Lymphs Abs: 1.5 10*3/uL (ref 0.7–4.0)
MCH: 37.7 pg — ABNORMAL HIGH (ref 26.0–34.0)
MCHC: 32 g/dL (ref 30.0–36.0)
MCV: 117.8 fL — ABNORMAL HIGH (ref 80.0–100.0)
Monocytes Absolute: 2.1 10*3/uL — ABNORMAL HIGH (ref 0.1–1.0)
Monocytes Relative: 39 %
Neutro Abs: 1.6 10*3/uL — ABNORMAL LOW (ref 1.7–7.7)
Neutrophils Relative %: 30 %
Platelet Count: 92 10*3/uL — ABNORMAL LOW (ref 150–400)
RBC: 2.47 MIL/uL — ABNORMAL LOW (ref 4.22–5.81)
RDW: 16.1 % — ABNORMAL HIGH (ref 11.5–15.5)
WBC Count: 5.4 10*3/uL (ref 4.0–10.5)
nRBC: 0 % (ref 0.0–0.2)

## 2019-10-07 LAB — CMP (CANCER CENTER ONLY)
ALT: 9 U/L (ref 0–44)
AST: 15 U/L (ref 15–41)
Albumin: 3.9 g/dL (ref 3.5–5.0)
Alkaline Phosphatase: 55 U/L (ref 38–126)
Anion gap: 9 (ref 5–15)
BUN: 26 mg/dL — ABNORMAL HIGH (ref 8–23)
CO2: 27 mmol/L (ref 22–32)
Calcium: 9.9 mg/dL (ref 8.9–10.3)
Chloride: 105 mmol/L (ref 98–111)
Creatinine: 1.63 mg/dL — ABNORMAL HIGH (ref 0.61–1.24)
GFR, Est AFR Am: 43 mL/min — ABNORMAL LOW (ref >60)
GFR, Estimated: 37 mL/min — ABNORMAL LOW (ref >60)
Glucose, Bld: 102 mg/dL — ABNORMAL HIGH (ref 70–99)
Potassium: 4.2 mmol/L (ref 3.5–5.1)
Sodium: 141 mmol/L (ref 135–145)
Total Bilirubin: 0.8 mg/dL (ref 0.3–1.2)
Total Protein: 6.5 g/dL (ref 6.5–8.1)

## 2019-10-07 LAB — BASIC METABOLIC PANEL
BUN/Creatinine Ratio: 15 (ref 10–24)
BUN: 25 mg/dL (ref 8–27)
CO2: 22 mmol/L (ref 20–29)
Calcium: 9.5 mg/dL (ref 8.6–10.2)
Chloride: 101 mmol/L (ref 96–106)
Creatinine, Ser: 1.72 mg/dL — ABNORMAL HIGH (ref 0.76–1.27)
GFR calc Af Amer: 40 mL/min/{1.73_m2} — ABNORMAL LOW (ref >59)
GFR calc non Af Amer: 34 mL/min/{1.73_m2} — ABNORMAL LOW (ref >59)
Glucose: 78 mg/dL (ref 65–99)
Potassium: 4.2 mmol/L (ref 3.5–5.2)
Sodium: 139 mmol/L (ref 134–144)

## 2019-10-07 LAB — RETICULOCYTES
Immature Retic Fract: 23.9 % — ABNORMAL HIGH (ref 2.3–15.9)
RBC.: 2.45 MIL/uL — ABNORMAL LOW (ref 4.22–5.81)
Retic Count, Absolute: 48.8 10*3/uL (ref 19.0–186.0)
Retic Ct Pct: 2 % (ref 0.4–3.1)

## 2019-10-07 LAB — MAGNESIUM: Magnesium: 2.1 mg/dL (ref 1.6–2.3)

## 2019-10-07 LAB — BRAIN NATRIURETIC PEPTIDE: BNP: 319 pg/mL — ABNORMAL HIGH (ref 0.0–100.0)

## 2019-10-07 MED ORDER — DARBEPOETIN ALFA 500 MCG/ML IJ SOSY
500.0000 ug | PREFILLED_SYRINGE | Freq: Once | INTRAMUSCULAR | Status: AC
  Administered 2019-10-07: 500 ug via SUBCUTANEOUS

## 2019-10-07 MED ORDER — DARBEPOETIN ALFA 500 MCG/ML IJ SOSY
PREFILLED_SYRINGE | INTRAMUSCULAR | Status: AC
  Filled 2019-10-07: qty 1

## 2019-10-07 NOTE — Progress Notes (Signed)
Hematology and Oncology Follow Up Visit  Jeremiah Walker [id number] January 16, 1928 86 y.o. 10/07/2019   Principle Diagnosis:  History of diffuse large cell non-Hodgkin's lymphoma-treated in [place] Chronic Myelomonocytic Leukemia (CMMoL) - Normal cytogenetics  Current Therapy:   Aranesp 500 mcg sq q 3 weeks for Hgb < 11 Folic acid 2 mg PO daily   Interim History:  Jeremiah Walker is here today for follow-up.  He was hospitalized couple weeks ago.  Sounds like he had a bout of congestive heart failure.  He did have an echocardiogram in the hospital.  This showed an ejection fraction of 55-60%.  He has some mild diastolic dysfunction.  He has severe left atrial dilation.  He is doing exercising.  Now that he has had his coronavirus vaccines, he feels like he can be a little bit more active.  I must say that I think that there has been a little bit of a change with respect to this bone marrow.  His blood counts are not nearly as vigorous as it used to be.  We increased his dose of Aranesp to try to get his hemoglobin better.  He is going to plan on going up to [place] this summer.  I think [place] has a lake house up there.  I really want him going up there in good shape and not run into problems while he is up there.  He is eating well.  He is watching his salt intake.  He is having no problems with bowels or bladder.  He has had no problems with fever.  He has had no cough.  He has had no nausea or vomiting.  Overall, I would say his performance status is ECOG 2.     Medications:  Allergies as of 10/07/2019      Reactions   Nitroglycerin Other (See Comments)   "I ended up in a fetal position from the pain"   Nsaids Other (See Comments), Tinitus   Bruising   Statins Nausea And Vomiting, Other (See Comments)   Kidney issues and muscle pain   Tolmetin Other (See Comments)   Bruising   Ciprofloxacin Other (See Comments)   Achilles tendon issues   Ropinirole Anxiety, Other (See Comments)   Mood swings, also   Requip [ropinirole Hcl] Anxiety      Medication List       Accurate as of October 07, 2019  4:15 PM. If you have any questions, ask your nurse or doctor.        allopurinol 100 MG tablet Commonly known as: ZYLOPRIM TAKE 2 TABLETS(200 MG) BY MOUTH DAILY What changed:   how much to take  how to take this  when to take this  additional instructions   ALPRAZolam 0.5 MG tablet Commonly known as: XANAX TAKE 1 TABLET(0.5 MG) BY MOUTH THREE TIMES DAILY AS NEEDED FOR ANXIETY What changed:   how much to take  how to take this  when to take this  additional instructions   amoxicillin 500 MG capsule Commonly known as: AMOXIL Take 4 caps (2gm) by mouth once prior to dental procedure What changed:   how much to take  how to take this  when to take this  additional instructions   azelastine 0.1 % nasal spray Commonly known as: ASTELIN Place 2 sprays into both nostrils 2 (two) times daily. Use in each nostril as directed What changed:   when to take this  reasons to take this  additional instructions   b complex  vitamins tablet Take 1 tablet by mouth daily.   baclofen 10 MG tablet Commonly known as: LIORESAL Take 1/2 to 1 by mouth every 8hrs as needed for spasm What changed:   how much to take  how to take this  when to take this  reasons to take this  additional instructions   bethanechol 25 MG tablet Commonly known as: URECHOLINE TAKE 1 TABLET(25 MG) BY MOUTH THREE TIMES DAILY What changed:   how much to take  how to take this  when to take this  additional instructions   Calcium 500 MG tablet Take 500 mg by mouth daily.   folic acid 1 MG tablet Commonly known as: FOLVITE TAKE 2 TABLETS(2 MG) BY MOUTH DAILY   furosemide 40 MG tablet Commonly known as: Lasix Take 1 tablet (40 mg total) by mouth daily as needed for fluid or edema.   HYDROcodone-acetaminophen 7.5-325 MG tablet Commonly known as: NORCO Take 1  tablet by mouth every 6 (six) hours as needed for moderate pain.   Magnesium Oxide 200 MG Tabs Take 200 mg by mouth daily.   Melatonin 10 MG Tabs Take 20 mg by mouth at bedtime.   METAMUCIL PO Take by mouth See admin instructions. Mix 1 teaspoonful into 6-8 ounces of water and drink (in conjunction with Miralax) once a day only when taking Norco   multivitamin tablet Take 1 tablet by mouth daily.   OMEGA-3 FISH OIL PO Take 1 capsule by mouth daily. EPA/DHA 520/350   polyethylene glycol 17 g packet Commonly known as: MIRALAX / GLYCOLAX Take 17 g by mouth daily as needed (into 6-8 ounces of water and drink (in conjunction with Metamucil) once a day only when taking Norco).   Potassium Chloride ER 20 MEQ Tbcr Take 40 mEq by mouth daily for 2 days.   predniSONE 20 MG tablet Commonly known as: DELTASONE May take 1 tablet daily for up to 3 days as needed for gout attack or muscle spasm What changed:   how much to take  how to take this  when to take this  additional instructions   pregabalin 25 MG capsule Commonly known as: Lyrica Take 1 capsule (25 mg total) by mouth 3 (three) times daily.   Systane Ultra PF 0.4-0.3 % Soln Generic drug: Polyethyl Glyc-Propyl Glyc PF Place 1-2 drops into both eyes 3 (three) times daily as needed (for dryness).   VITAMIN B-12 PO Take 1 tablet by mouth daily after breakfast.   VITAMIN B-6 PO Take 1 tablet by mouth daily after breakfast.   vitamin C 500 MG tablet Commonly known as: ASCORBIC ACID Take 1,000 mg by mouth daily.   Vitamin D3 50 MCG (2000 UT) capsule Take 2,000 Units by mouth daily.   zinc gluconate 50 MG tablet Take 50 mg by mouth daily.       Allergies:  Allergies  Allergen Reactions  . Nitroglycerin Other (See Comments)    "I ended up in a fetal position from the pain"  . Nsaids Other (See Comments) and Tinitus    Bruising   . Statins Nausea And Vomiting and Other (See Comments)    Kidney issues and  muscle pain   . Tolmetin Other (See Comments)    Bruising  . Ciprofloxacin Other (See Comments)    Achilles tendon issues  . Ropinirole Anxiety and Other (See Comments)    Mood swings, also  . Requip [Ropinirole Hcl] Anxiety    Past Medical History, Surgical history, Social history,  and Family History were reviewed and updated.  Review of Systems: Review of Systems  Constitutional: Positive for malaise/fatigue.  HENT: Negative.   Eyes: Negative.   Respiratory: Negative.   Cardiovascular: Positive for orthopnea and leg swelling.  Gastrointestinal: Negative.   Genitourinary: Negative.   Musculoskeletal: Positive for back pain.  Skin: Negative.   Neurological: Negative.   Endo/Heme/Allergies: Negative.   Psychiatric/Behavioral: Negative.       Physical Exam:  height is 5' 9.5" (1.765 m) and weight is 204 lb 1.9 oz (92.6 kg). His temporal temperature is 97.7 F (36.5 C). His blood pressure is 109/80 and his pulse is 78. His respiration is 19 and oxygen saturation is 99%.   Wt Readings from Last 3 Encounters:  10/07/19 204 lb 1.9 oz (92.6 kg)  10/06/19 205 lb (93 kg)  10/05/19 203 lb (92.1 kg)    Physical Exam Vitals reviewed.  HENT:     Head: Normocephalic and atraumatic.  Eyes:     Pupils: Pupils are equal, round, and reactive to light.  Cardiovascular:     Rate and Rhythm: Normal rate and regular rhythm.     Heart sounds: Normal heart sounds.  Pulmonary:     Effort: Pulmonary effort is normal.     Breath sounds: Normal breath sounds.  Abdominal:     General: Bowel sounds are normal.     Palpations: Abdomen is soft.  Musculoskeletal:        General: No tenderness or deformity. Normal range of motion.     Cervical back: Normal range of motion.  Lymphadenopathy:     Cervical: No cervical adenopathy.  Skin:    General: Skin is warm and dry.     Findings: No erythema or rash.  Neurological:     Mental Status: He is alert and oriented to person, place, and  time.  Psychiatric:        Behavior: Behavior normal.        Thought Content: Thought content normal.        Judgment: Judgment normal.      Lab Results  Component Value Date   WBC 5.4 10/07/2019   HGB 9.3 (L) 10/07/2019   HCT 29.1 (L) 10/07/2019   MCV 117.8 (H) 10/07/2019   PLT 92 (L) 10/07/2019   Lab Results  Component Value Date   FERRITIN 401 (H) 09/17/2019   IRON 81 09/17/2019   TIBC 234 09/17/2019   UIBC 152 09/17/2019   IRONPCTSAT 35 09/17/2019   Lab Results  Component Value Date   RETICCTPCT 2.0 10/07/2019   RBC 2.47 (L) 10/07/2019   RBC 2.45 (L) 10/07/2019   No results found for: KPAFRELGTCHN, LAMBDASER, KAPLAMBRATIO No results found for: IGGSERUM, IGA, IGMSERUM No results found for: Odetta Pink, SPEI   Chemistry      Component Value Date/Time   NA 141 10/07/2019 1444   NA 139 10/06/2019 1240   NA 139 05/08/2017 1123   NA 141 05/15/2016 1126   K 4.2 10/07/2019 1444   K 4.0 05/08/2017 1123   K 4.3 05/15/2016 1126   CL 105 10/07/2019 1444   CL 105 05/08/2017 1123   CO2 27 10/07/2019 1444   CO2 30 05/08/2017 1123   CO2 26 05/15/2016 1126   BUN 26 (H) 10/07/2019 1444   BUN 25 10/06/2019 1240   BUN 15 05/08/2017 1123   BUN 29.1 (H) 05/15/2016 1126   CREATININE 1.63 (H) 10/07/2019 1444   CREATININE 1.2  05/08/2017 1123   CREATININE 1.7 (H) 05/15/2016 1126   GLU 90 11/15/2014 0000      Component Value Date/Time   CALCIUM 9.9 10/07/2019 1444   CALCIUM 9.1 05/08/2017 1123   CALCIUM 9.7 05/15/2016 1126   ALKPHOS 55 10/07/2019 1444   ALKPHOS 64 05/08/2017 1123   ALKPHOS 69 05/15/2016 1126   AST 15 10/07/2019 1444   AST 23 05/15/2016 1126   ALT 9 10/07/2019 1444   ALT 19 05/08/2017 1123   ALT 22 05/15/2016 1126   BILITOT 0.8 10/07/2019 1444   BILITOT 0.84 05/15/2016 1126       Impression and Plan: JeremiahVincentis a 86yo caucasian gentleman withhistory of diffuse large cell non-Hodgkin's  lymphomatreated with6 cycles of R-CHOP completed in April 2014.   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  His hemoglobin is a little bit better.  Again, I really like him above 10.  If we get above 11 that would be ideal.  His platelet count is also on the lower side.  I looked at his blood smear under the microscope.  I really do not see anything that looked different.  I do not see really immature myeloid or lymphoid cells.  He had no nucleated red blood cells.  He did have several large platelets.  I would like to see him back in 3 weeks.  Are going to have to follow him along closely.  Again my goal is to make sure that he can get up to [place] this summer and enjoy his vacation of there with his family.    [name], MD 4/14/20214:15 PM

## 2019-10-07 NOTE — Patient Instructions (Signed)
Darbepoetin Alfa injection What is this medicine? DARBEPOETIN ALFA (dar be POE e tin AL fa) helps your body make more red blood cells. It is used to treat anemia caused by chronic kidney failure and chemotherapy. This medicine may be used for other purposes; ask your health care provider or pharmacist if you have questions. COMMON BRAND NAME(S): Aranesp What should I tell my health care provider before I take this medicine? They need to know if you have any of these conditions:  blood clotting disorders or history of blood clots  cancer patient not on chemotherapy  cystic fibrosis  heart disease, such as angina, heart failure, or a history of a heart attack  hemoglobin level of 12 g/dL or greater  high blood pressure  low levels of folate, iron, or vitamin B12  seizures  an unusual or allergic reaction to darbepoetin, erythropoietin, albumin, hamster proteins, latex, other medicines, foods, dyes, or preservatives  pregnant or trying to get pregnant  breast-feeding How should I use this medicine? This medicine is for injection into a vein or under the skin. It is usually given by a health care professional in a hospital or clinic setting. If you get this medicine at home, you will be taught how to prepare and give this medicine. Use exactly as directed. Take your medicine at regular intervals. Do not take your medicine more often than directed. It is important that you put your used needles and syringes in a special sharps container. Do not put them in a trash can. If you do not have a sharps container, call your pharmacist or healthcare provider to get one. A special MedGuide will be given to you by the pharmacist with each prescription and refill. Be sure to read this information carefully each time. Talk to your pediatrician regarding the use of this medicine in children. While this medicine may be used in children as young as 1 month of age for selected conditions, precautions do  apply. Overdosage: If you think you have taken too much of this medicine contact a poison control center or emergency room at once. NOTE: This medicine is only for you. Do not share this medicine with others. What if I miss a dose? If you miss a dose, take it as soon as you can. If it is almost time for your next dose, take only that dose. Do not take double or extra doses. What may interact with this medicine? Do not take this medicine with any of the following medications:  epoetin alfa This list may not describe all possible interactions. Give your health care provider a list of all the medicines, herbs, non-prescription drugs, or dietary supplements you use. Also tell them if you smoke, drink alcohol, or use illegal drugs. Some items may interact with your medicine. What should I watch for while using this medicine? Your condition will be monitored carefully while you are receiving this medicine. You may need blood work done while you are taking this medicine. This medicine may cause a decrease in vitamin B6. You should make sure that you get enough vitamin B6 while you are taking this medicine. Discuss the foods you eat and the vitamins you take with your health care professional. What side effects may I notice from receiving this medicine? Side effects that you should report to your doctor or health care professional as soon as possible:  allergic reactions like skin rash, itching or hives, swelling of the face, lips, or tongue  breathing problems  changes in   vision  chest pain  confusion, trouble speaking or understanding  feeling faint or lightheaded, falls  high blood pressure  muscle aches or pains  pain, swelling, warmth in the leg  rapid weight gain  severe headaches  sudden numbness or weakness of the face, arm or leg  trouble walking, dizziness, loss of balance or coordination  seizures (convulsions)  swelling of the ankles, feet, hands  unusually weak or  tired Side effects that usually do not require medical attention (report to your doctor or health care professional if they continue or are bothersome):  diarrhea  fever, chills (flu-like symptoms)  headaches  nausea, vomiting  redness, stinging, or swelling at site where injected This list may not describe all possible side effects. Call your doctor for medical advice about side effects. You may report side effects to FDA at 1-800-FDA-1088. Where should I keep my medicine? Keep out of the reach of children. Store in a refrigerator between 2 and 8 degrees C (36 and 46 degrees F). Do not freeze. Do not shake. Throw away any unused portion if using a single-dose vial. Throw away any unused medicine after the expiration date. NOTE: This sheet is a summary. It may not cover all possible information. If you have questions about this medicine, talk to your doctor, pharmacist, or health care provider.  2020 Elsevier/Gold Standard (2017-06-26 16:44:20)  

## 2019-10-08 ENCOUNTER — Telehealth: Payer: Self-pay | Admitting: Hematology & Oncology

## 2019-10-08 DIAGNOSIS — R2689 Other abnormalities of gait and mobility: Secondary | ICD-10-CM | POA: Diagnosis not present

## 2019-10-08 DIAGNOSIS — M6281 Muscle weakness (generalized): Secondary | ICD-10-CM | POA: Diagnosis not present

## 2019-10-08 DIAGNOSIS — M5126 Other intervertebral disc displacement, lumbar region: Secondary | ICD-10-CM | POA: Diagnosis not present

## 2019-10-08 DIAGNOSIS — R293 Abnormal posture: Secondary | ICD-10-CM | POA: Diagnosis not present

## 2019-10-08 DIAGNOSIS — M545 Low back pain: Secondary | ICD-10-CM | POA: Diagnosis not present

## 2019-10-08 LAB — IRON AND TIBC
Iron: 105 ug/dL (ref 42–163)
Saturation Ratios: 44 % (ref 20–55)
TIBC: 236 ug/dL (ref 202–409)
UIBC: 131 ug/dL (ref 117–376)

## 2019-10-08 LAB — FERRITIN: Ferritin: 329 ng/mL (ref 24–336)

## 2019-10-08 NOTE — Telephone Encounter (Signed)
Appointments scheduled calendar mailed per 4/14 los

## 2019-10-13 ENCOUNTER — Encounter (HOSPITAL_BASED_OUTPATIENT_CLINIC_OR_DEPARTMENT_OTHER): Payer: Self-pay

## 2019-10-13 ENCOUNTER — Other Ambulatory Visit: Payer: Self-pay

## 2019-10-13 ENCOUNTER — Emergency Department (HOSPITAL_BASED_OUTPATIENT_CLINIC_OR_DEPARTMENT_OTHER)
Admission: EM | Admit: 2019-10-13 | Discharge: 2019-10-13 | Disposition: A | Discharge status: Home / Self Care | Payer: Medicare Other | Attending: Emergency Medicine | Admitting: Emergency Medicine

## 2019-10-13 DIAGNOSIS — Z79899 Other long term (current) drug therapy: Secondary | ICD-10-CM | POA: Diagnosis not present

## 2019-10-13 DIAGNOSIS — Z888 Allergy status to other drugs, medicaments and biological substances status: Secondary | ICD-10-CM | POA: Insufficient documentation

## 2019-10-13 DIAGNOSIS — R253 Fasciculation: Secondary | ICD-10-CM | POA: Diagnosis not present

## 2019-10-13 DIAGNOSIS — R400 Somnolence: Secondary | ICD-10-CM | POA: Diagnosis not present

## 2019-10-13 DIAGNOSIS — R5383 Other fatigue: Secondary | ICD-10-CM | POA: Insufficient documentation

## 2019-10-13 DIAGNOSIS — I509 Heart failure, unspecified: Secondary | ICD-10-CM | POA: Insufficient documentation

## 2019-10-13 DIAGNOSIS — Z87891 Personal history of nicotine dependence: Secondary | ICD-10-CM | POA: Diagnosis not present

## 2019-10-13 DIAGNOSIS — N183 Chronic kidney disease, stage 3 unspecified: Secondary | ICD-10-CM | POA: Insufficient documentation

## 2019-10-13 DIAGNOSIS — I130 Hypertensive heart and chronic kidney disease with heart failure and stage 1 through stage 4 chronic kidney disease, or unspecified chronic kidney disease: Secondary | ICD-10-CM | POA: Insufficient documentation

## 2019-10-13 LAB — CBC
HCT: 28.4 % — ABNORMAL LOW (ref 39.0–52.0)
Hemoglobin: 9.3 g/dL — ABNORMAL LOW (ref 13.0–17.0)
MCH: 39.2 pg — ABNORMAL HIGH (ref 26.0–34.0)
MCHC: 32.7 g/dL (ref 30.0–36.0)
MCV: 119.8 fL — ABNORMAL HIGH (ref 80.0–100.0)
Platelets: 79 10*3/uL — ABNORMAL LOW (ref 150–400)
RBC: 2.37 MIL/uL — ABNORMAL LOW (ref 4.22–5.81)
RDW: 16.3 % — ABNORMAL HIGH (ref 11.5–15.5)
WBC: 5.5 10*3/uL (ref 4.0–10.5)
nRBC: 0.4 % — ABNORMAL HIGH (ref 0.0–0.2)

## 2019-10-13 LAB — URINALYSIS, ROUTINE W REFLEX MICROSCOPIC
Bilirubin Urine: NEGATIVE
Glucose, UA: NEGATIVE mg/dL
Ketones, ur: NEGATIVE mg/dL
Leukocytes,Ua: NEGATIVE
Nitrite: NEGATIVE
Protein, ur: NEGATIVE mg/dL
Specific Gravity, Urine: <1.005 — ABNORMAL LOW (ref 1.005–1.030)
pH: 6 (ref 5.0–8.0)

## 2019-10-13 LAB — BASIC METABOLIC PANEL
Anion gap: 9 (ref 5–15)
BUN: 25 mg/dL — ABNORMAL HIGH (ref 8–23)
CO2: 24 mmol/L (ref 22–32)
Calcium: 9 mg/dL (ref 8.9–10.3)
Chloride: 105 mmol/L (ref 98–111)
Creatinine, Ser: 1.64 mg/dL — ABNORMAL HIGH (ref 0.61–1.24)
GFR calc Af Amer: 42 mL/min — ABNORMAL LOW (ref >60)
GFR calc non Af Amer: 37 mL/min — ABNORMAL LOW (ref >60)
Glucose, Bld: 113 mg/dL — ABNORMAL HIGH (ref 70–99)
Potassium: 3.7 mmol/L (ref 3.5–5.1)
Sodium: 138 mmol/L (ref 135–145)

## 2019-10-13 LAB — URINALYSIS, MICROSCOPIC (REFLEX)

## 2019-10-13 LAB — HEPATIC FUNCTION PANEL
ALT: 14 U/L (ref 0–44)
AST: 18 U/L (ref 15–41)
Albumin: 3.8 g/dL (ref 3.5–5.0)
Alkaline Phosphatase: 55 U/L (ref 38–126)
Bilirubin, Direct: 0.1 mg/dL (ref 0.0–0.2)
Indirect Bilirubin: 0.6 mg/dL (ref 0.3–0.9)
Total Bilirubin: 0.7 mg/dL (ref 0.3–1.2)
Total Protein: 7.2 g/dL (ref 6.5–8.1)

## 2019-10-13 NOTE — ED Provider Notes (Signed)
[facility] EMERGENCY DEPARTMENT Provider Note   CSN: [id number] Arrival date & time: 10/13/19  1556     History Chief Complaint  Patient presents with  . Spasms    Jeremiah Walker is a 86 y.o. male.  86 year old male with extensive past medical history below including CHF, CKD, and CMML who p/w muscle twitching.  Yesterday he began having twitching mostly in bilateral hands that is non-painful, no associated focal weakness. It occurs randomly, sometimes making him drop his phone. No headache, speech problems, fevers, cough/cold symptoms, urinary symptoms, V/D, or recent illness.  He was admitted to the hospital a few weeks ago with CHF exacerbation and discharged home on Lasix which he has been taking. No other medication changes.   Patient also reports some daytime sleepiness over the past week.  He denies any other associated symptoms, states that he usually takes melatonin to go to sleep.  He denies significant nocturia.  No breathing problems.  The history is provided by the patient.       Past Medical History:  Diagnosis Date  . Acute exacerbation of CHF (congestive heart failure) ([place]) 09/25/2019  . Anemia   . Anemia due to antineoplastic chemotherapy 08/15/2015  . Anxiety   . Arthritis   . Cataract   . Chronic kidney disease (CKD), stage III (moderate) 02/09/2015   Controlled  . CMML (chronic myelomonocytic leukemia) ([place])    gets Aranesp about monthly, otherwise surveillance with Dr. Marin Olp  . Combined fat and carbohydrate induced hyperlipemia 02/09/2015   Controlled  . Depression   . DLBCL (diffuse large B cell lymphoma) ([place]) 08/15/2015  . Essential (primary) hypertension 02/09/2015  . Gastro-esophageal reflux disease without esophagitis 02/09/2015   Controlled  . Renal insufficiency   . Tinnitus     Patient Active Problem List   Diagnosis Date Noted  . Acute exacerbation of CHF (congestive heart failure) ([place]) 09/25/2019  . Elevated troponin  09/25/2019  . Spondylosis without myelopathy or radiculopathy, lumbar region 08/12/2019  . Spinal stenosis of lumbar region with neurogenic claudication 08/12/2019  . Myofascial pain syndrome 08/12/2019  . Chest pain 03/26/2019  . Chronic myelomonocytic leukemia not having achieved remission ([place]) 12/04/2016  . Chronic pain syndrome 09/26/2015  . De Quervain's tenosynovitis, right 09/13/2015  . Anemia due to antineoplastic chemotherapy 08/15/2015  . DLBCL (diffuse large B cell lymphoma) ([place]) 08/15/2015  . Skin lesion of face 05/21/2015  . Peripheral neuropathy due to chemotherapy ([place]) 02/09/2015  . Benign prostatic hyperplasia with urinary obstruction 02/09/2015  . Bilateral hearing loss 02/09/2015  . Low back pain 02/09/2015  . CKD (chronic kidney disease), stage III 02/09/2015  . Essential (primary) hypertension 02/09/2015  . Gastro-esophageal reflux disease without esophagitis 02/09/2015  . Anxiety, generalized 02/09/2015  . Cannot sleep 02/09/2015  . Combined fat and carbohydrate induced hyperlipemia 02/09/2015    Past Surgical History:  Procedure Laterality Date  . CATARACT EXTRACTION, BILATERAL    . COLONOSCOPY  May 2011  . CYSTOSCOPY    . TOTAL HIP ARTHROPLASTY  2003   Right   . TOTAL HIP ARTHROPLASTY  April, 2011   Left  . TRIGGER FINGER RELEASE  Nov 2013  . TURBINECTOMY  2006  . UPPER GI ENDOSCOPY  Nov 2015       Family History  Problem Relation Age of Onset  . Anuerysm Mother 78       Deceased  . Colon cancer Father 93       Deceased  . Colon  cancer Brother   . Prostate cancer Brother        Deceased  . Alzheimer's disease Brother   . Heart disease Brother   . Alzheimer's disease Paternal Grandmother   . Early death Maternal Grandmother        Unknown  . Obesity Son   . Obesity Son        Gastric Bypass  . Healthy Son   . Healthy Daughter   . Diabetes Neg Hx     Social History   Tobacco Use  . Smoking status: Former Research scientist (life sciences)  . Smokeless  tobacco: Never Used  . Tobacco comment: Quit >50 years ago  Substance Use Topics  . Alcohol use: Yes    Alcohol/week: 1.0 - 4.0 standard drinks    Types: 1 - 4 Shots of liquor per week    Comment: 4 glasses per week  . Drug use: No    Home Medications Prior to Admission medications   Medication Sig Start Date End Date Taking? Authorizing Provider  allopurinol (ZYLOPRIM) 100 MG tablet TAKE 2 TABLETS(200 MG) BY MOUTH DAILY Patient taking differently: Take 200 mg by mouth daily with breakfast.  05/19/19   Copland, Gay Filler, MD  ALPRAZolam (XANAX) 0.5 MG tablet TAKE 1 TABLET(0.5 MG) BY MOUTH THREE TIMES DAILY AS NEEDED FOR ANXIETY Patient taking differently: Take 1 mg by mouth at bedtime.  07/21/19   Copland, Gay Filler, MD  amoxicillin (AMOXIL) 500 MG capsule Take 4 caps (2gm) by mouth once prior to dental procedure Patient taking differently: Take 2,000 mg by mouth See admin instructions. Take 2,000 mg by mouth one hour prior to dental procedures 08/06/19   Copland, Gay Filler, MD  azelastine (ASTELIN) 0.1 % nasal spray Place 2 sprays into both nostrils 2 (two) times daily. Use in each nostril as directed Patient taking differently: Place 2 sprays into both nostrils 2 (two) times daily as needed (for seasonal allergies).  03/24/18   Copland, Gay Filler, MD  b complex vitamins tablet Take 1 tablet by mouth daily.    [provider]  baclofen (LIORESAL) 10 MG tablet Take 1/2 to 1 by mouth every 8hrs as needed for spasm Patient taking differently: Take 5-10 mg by mouth every 8 (eight) hours as needed for muscle spasms.  08/04/19   Magnus Sinning, MD  bethanechol (URECHOLINE) 25 MG tablet TAKE 1 TABLET(25 MG) BY MOUTH THREE TIMES DAILY Patient taking differently: Take 25 mg by mouth 3 (three) times daily.  07/14/19   Copland, Gay Filler, MD  Calcium 500 MG tablet Take 500 mg by mouth daily.     [provider]  Cholecalciferol (VITAMIN D3) 2000 UNITS capsule Take 2,000 Units by mouth  daily.    [provider]  Cyanocobalamin (VITAMIN B-12 PO) Take 1 tablet by mouth daily after breakfast.    [provider]  folic acid (FOLVITE) 1 MG tablet TAKE 2 TABLETS(2 MG) BY MOUTH DAILY 09/26/19   [name], MD  furosemide (LASIX) 40 MG tablet Take 1 tablet (40 mg total) by mouth daily as needed for fluid or edema. 09/27/19 10/27/19  Amin, Jeanella Flattery, MD  HYDROcodone-acetaminophen (NORCO) 7.5-325 MG tablet Take 1 tablet by mouth every 6 (six) hours as needed for moderate pain. 10/05/19   Copland, Gay Filler, MD  Magnesium Oxide 200 MG TABS Take 200 mg by mouth daily.  05/02/11   [provider]  Melatonin 10 MG TABS Take 20 mg by mouth at bedtime.  [provider]  Multiple Vitamin (MULTIVITAMIN) tablet Take 1 tablet by mouth daily.    [provider]  Omega-3 Fatty Acids (OMEGA-3 FISH OIL PO) Take 1 capsule by mouth daily. EPA/DHA 520/350     [provider]  Polyethyl Glyc-Propyl Glyc PF (SYSTANE ULTRA PF) 0.4-0.3 % SOLN Place 1-2 drops into both eyes 3 (three) times daily as needed (for dryness).     [provider]  polyethylene glycol (MIRALAX / GLYCOLAX) packet Take 17 g by mouth daily as needed (into 6-8 ounces of water and drink (in conjunction with Metamucil) once a day only when taking Norco).     [provider]  potassium chloride 20 MEQ TBCR Take 40 mEq by mouth daily for 2 days. 09/27/19 09/29/19  Damita Lack, MD  predniSONE (DELTASONE) 20 MG tablet May take 1 tablet daily for up to 3 days as needed for gout attack or muscle spasm Patient taking differently: Take 20 mg by mouth See admin instructions. Take 20 mg by mouth once a day for up to three days, as directed/as needed for gout attacks or muscle spasms/pain flares 01/30/19   Copland, Gay Filler, MD  pregabalin (LYRICA) 25 MG capsule Take 1 capsule (25 mg total) by mouth 3 (three) times daily. 08/10/19   Copland, Gay Filler, MD  Psyllium (METAMUCIL PO)  Take by mouth See admin instructions. Mix 1 teaspoonful into 6-8 ounces of water and drink (in conjunction with Miralax) once a day only when taking Norco    [provider]  Pyridoxine HCl (VITAMIN B-6 PO) Take 1 tablet by mouth daily after breakfast.    [provider]  vitamin C (ASCORBIC ACID) 500 MG tablet Take 1,000 mg by mouth daily.     [provider]  zinc gluconate 50 MG tablet Take 50 mg by mouth daily.    [provider]    Allergies    Nitroglycerin, Nsaids, Statins, Tolmetin, Ciprofloxacin, Ropinirole, and Requip [ropinirole hcl]  Review of Systems   Review of Systems All other systems reviewed and are negative except that which was mentioned in HPI  Physical Exam Updated Vital Signs BP 133/72 (BP Location: Right Arm)   Pulse 77   Temp 98.5 F (36.9 C) (Oral)   Resp 19   Ht 5' 9.5" (1.765 m)   Wt 91.6 kg   SpO2 95%   BMI 29.40 kg/m   Physical Exam Vitals and nursing note reviewed.  Constitutional:      General: He is not in acute distress.    Appearance: He is well-developed.     Comments: Awake, alert  HENT:     Head: Normocephalic and atraumatic.  Eyes:     Extraocular Movements: Extraocular movements intact.     Conjunctiva/sclera: Conjunctivae normal.     Pupils: Pupils are equal, round, and reactive to light.  Cardiovascular:     Rate and Rhythm: Normal rate and regular rhythm.     Heart sounds: Murmur present.  Pulmonary:     Effort: Pulmonary effort is normal. No respiratory distress.     Breath sounds: Normal breath sounds.  Abdominal:     General: Bowel sounds are normal. There is no distension.     Palpations: Abdomen is soft.     Tenderness: There is no abdominal tenderness.  Musculoskeletal:     Cervical back: Neck supple.  Skin:    General: Skin is warm and dry.  Neurological:     Mental Status: He is  alert and oriented to person, place, and time.     Cranial Nerves: No cranial nerve deficit.      Motor: No abnormal muscle tone.     Deep Tendon Reflexes: Reflexes are normal and symmetric.     Comments: Fluent speech, normal finger-to-nose testing, negative pronator drift, no clonus 5/5 strength and normal sensation x all 4 extremities  Psychiatric:        Thought Content: Thought content normal.        Judgment: Judgment normal.     ED Results / Procedures / Treatments   Labs (all labs ordered are listed, but only abnormal results are displayed) Labs Reviewed  BASIC METABOLIC PANEL - Abnormal; Notable for the following components:      Result Value   Glucose, Bld 113 (*)    BUN 25 (*)    Creatinine, Ser 1.64 (*)    GFR calc non Af Amer 37 (*)    GFR calc Af Amer 42 (*)    All other components within normal limits  CBC - Abnormal; Notable for the following components:   RBC 2.37 (*)    Hemoglobin 9.3 (*)    HCT 28.4 (*)    MCV 119.8 (*)    MCH 39.2 (*)    RDW 16.3 (*)    Platelets 79 (*)    nRBC 0.4 (*)    All other components within normal limits  URINALYSIS, ROUTINE W REFLEX MICROSCOPIC - Abnormal; Notable for the following components:   Specific Gravity, Urine <1.005 (*)    Hgb urine dipstick TRACE (*)    All other components within normal limits  URINALYSIS, MICROSCOPIC (REFLEX) - Abnormal; Notable for the following components:   Bacteria, UA FEW (*)    All other components within normal limits  HEPATIC FUNCTION PANEL    EKG None  Radiology No results found.  Procedures Procedures (including critical care time)  Medications Ordered in ED Medications - No data to display  ED Course  I have reviewed the triage vital signs and the nursing notes.  Pertinent labs that were available during my care of the patient were reviewed by me and considered in my medical decision making (see chart for details).    MDM Rules/Calculators/A&P                      Patient had a reassuring neurologic exam.  He occasionally jerked on the arm or hand but this seemed  to dissipate during conversation and extinguish during neurologic exam. I do not feel sx represent acute neurologic process. Screening labs similar to previous and no hypokalemia/hypocalcemia.  No signs of infection on UA and no respiratory symptoms.  I encouraged to follow-up with PCP and try a dose of baclofen which she already has at home.  I cautioned on potential side effects.  Also encouraged to follow-up with PCP regarding his daytime sleepiness.  He does not appear to have any other symptoms to suggest infectious process, hypercarbia/respiratory failure, or other acute process.  Final Clinical Impression(s) / ED Diagnoses Final diagnoses:  Muscle twitching  Daytime sleepiness    Rx / DC Orders ED Discharge Orders    None       Elyas Villamor, Wenda Overland, MD 10/13/19 1745

## 2019-10-13 NOTE — Discharge Instructions (Signed)
YOU MAY TRY A DOSE OF BACLOFEN AT HOME AS PRESCRIBED, TO SEE IF IT HELPS WITH MUSCLE TWITCHES. BE CAREFUL WITH THE MEDICATION AS IT WILL MAKE YOU SLEEPY AND CAN MAKE YOU OFF BALANCE. FOLLOW UP WITH YOUR PRIMARY CARE PROVIDER ABOUT YOUR MUSCLE TWITCHES AND YOUR DAYTIME SLEEPINESS.

## 2019-10-13 NOTE — ED Notes (Signed)
ED Provider at bedside. 

## 2019-10-13 NOTE — ED Triage Notes (Signed)
Pt arrives to ED with c/o muscle twitching since yesterday, pt reports his son thinks he is weak, but pt denies weakness. Pt does endorse being sleepier during the day.

## 2019-10-14 ENCOUNTER — Other Ambulatory Visit: Payer: Self-pay

## 2019-10-15 ENCOUNTER — Ambulatory Visit (INDEPENDENT_AMBULATORY_CARE_PROVIDER_SITE_OTHER): Payer: Medicare Other | Admitting: Family Medicine

## 2019-10-15 ENCOUNTER — Encounter: Payer: Self-pay | Admitting: Family Medicine

## 2019-10-15 ENCOUNTER — Other Ambulatory Visit: Payer: Self-pay

## 2019-10-15 VITALS — BP 110/65 | HR 82 | Temp 97.4°F | Resp 16 | Ht 66.5 in | Wt 206.0 lb

## 2019-10-15 DIAGNOSIS — G894 Chronic pain syndrome: Secondary | ICD-10-CM

## 2019-10-15 DIAGNOSIS — Z09 Encounter for follow-up examination after completed treatment for conditions other than malignant neoplasm: Secondary | ICD-10-CM

## 2019-10-15 DIAGNOSIS — I5033 Acute on chronic diastolic (congestive) heart failure: Secondary | ICD-10-CM | POA: Diagnosis not present

## 2019-10-15 DIAGNOSIS — G6289 Other specified polyneuropathies: Secondary | ICD-10-CM | POA: Diagnosis not present

## 2019-10-15 DIAGNOSIS — M62838 Other muscle spasm: Secondary | ICD-10-CM

## 2019-10-15 MED ORDER — BACLOFEN 10 MG PO TABS: ORAL_TABLET | ORAL | 0 refills | Status: DC

## 2019-10-15 MED ORDER — GABAPENTIN 300 MG PO CAPS: ORAL_CAPSULE | ORAL | 0 refills | Status: DC

## 2019-10-15 NOTE — Progress Notes (Signed)
[facility] [place], [place], [place] [address] [phone]  Date:  10/15/2019   Name:  Jeremiah Walker   DOB:  1928/01/15   MRN:  [id number]  PCP:  [name], MD    Chief Complaint: ER follow up (sleeping more than usual)   History of Present Illness:  Jeremiah Walker is a 86 y.o. very pleasant male patient who presents with the following:  Mentally sharp elderly gentleman with history of spinal stenosis causing neurogenic claudication and chronic pain, peripheral neuropathy, chronic pain disease, CML, CHF He was hospitalized for 2 days in the beginning of the month with a CHF exacerbation Following up from recent ER visit today Patient was seen at the med center emergency department on April 20 with muscle twitching, mostly in his hands His lab work at that time was reassuring, no particular cause for his twitching was noted.  He was asked to see me for follow-up  Pt notes that the day after Christmas he started to go downhill.  He had left hip issues during January and February, was not as active as usual.  He was able to be more active again in March He notes onset of muscle jerking at the end of last week, it will come and go It will occur in his hands and arms.  He actually dropped a plate   He has baclofen at home that he can use prn- he could use a refill of this  He was taking lyrica but changed back to gabapentin due to price.  The lyrica was a bit better but not enough to pay for  He is taking 300 mg generally TID.  He might take   He was seen by his oncologist, Dr. 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 His hemoglobin is a  little bit better.  Again, I really like him above 10.  If we get above 11 that would be ideal. His platelet count is also on the lower side.  He is seeing his ortho next week He is doing PT at his home facility   He notes that he is feeling much better today than he has in a week or so He slept well He ate a good breakfast   He is not lightheaded or weak   He took one lasix since he was discharged for CHF  Wt Readings from Last 3 Encounters:  10/15/19 206 lb (93.4 kg)  10/13/19 202 lb (91.6 kg)  10/07/19 204 lb 1.9 oz (92.6 kg)   He tries to weigh daily, but does not always do so He has not felt SOB.  He will check this by taking several deep breaths and make sure he is ok  No LE edema- this never really happened to him   He had his covid vaccine already at [facility]- he does not have the dates on him right now  Patient Active Problem List   Diagnosis Date Noted  . Acute exacerbation of CHF (congestive heart failure) ([place]) 09/25/2019  . Elevated troponin 09/25/2019  . Spondylosis without myelopathy or radiculopathy, lumbar region 08/12/2019  . Spinal stenosis of lumbar region with neurogenic claudication 08/12/2019  . Myofascial pain syndrome 08/12/2019  .  Chest pain 03/26/2019  . 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 Chronic pain syndrome 09/26/2015  . De Quervain's tenosynovitis, right 09/13/2015  . Anemia due to antineoplastic chemotherapy 08/15/2015  . DLBCL (diffuse large B cell lymphoma) ([place]) 08/15/2015  . Skin lesion of face 05/21/2015  . Peripheral neuropathy due to chemotherapy ([place]) 02/09/2015  . Benign prostatic hyperplasia with urinary obstruction 02/09/2015  . Bilateral hearing loss 02/09/2015  . Low back pain 02/09/2015  . CKD (chronic kidney disease), stage III 02/09/2015  . Essential (primary) hypertension 02/09/2015  . Gastro-esophageal reflux disease without esophagitis 02/09/2015  . Anxiety, generalized  02/09/2015  . Cannot sleep 02/09/2015  . Combined fat and carbohydrate induced hyperlipemia 02/09/2015    Past Medical History:  Diagnosis Date  . Acute exacerbation of CHF (congestive heart failure) ([place]) 09/25/2019  . Anemia   . Anemia due to antineoplastic chemotherapy 08/15/2015  . Anxiety   . Arthritis   . Cataract   . Chronic kidney disease (CKD), stage III (moderate) 02/09/2015   Controlled  . CMML (chronic myelomonocytic leukemia) ([place])    gets Aranesp about monthly, otherwise surveillance with Dr. Marin Olp  . Combined fat and carbohydrate induced hyperlipemia 02/09/2015   Controlled  . Depression   . DLBCL (diffuse large B cell lymphoma) ([place]) 08/15/2015  . Essential (primary) hypertension 02/09/2015  . Gastro-esophageal reflux disease without esophagitis 02/09/2015   Controlled  . Renal insufficiency   . Tinnitus     Past Surgical History:  Procedure Laterality Date  . CATARACT EXTRACTION, BILATERAL    . COLONOSCOPY  May 2011  . CYSTOSCOPY    . TOTAL HIP ARTHROPLASTY  2003   Right   . TOTAL HIP ARTHROPLASTY  April, 2011   Left  . TRIGGER FINGER RELEASE  Nov 2013  . TURBINECTOMY  2006  . UPPER GI ENDOSCOPY  Nov 2015    Social History   Tobacco Use  . Smoking status: Former Research scientist (life sciences)  . Smokeless tobacco: Never Used  . Tobacco comment: Quit >50 years ago  Substance Use Topics  . Alcohol use: Yes    Alcohol/week: 1.0 - 4.0 standard drinks    Types: 1 - 4 Shots of liquor per week    Comment: 4 glasses per week  . Drug use: No    Family History  Problem Relation Age of Onset  . Anuerysm Mother 71       Deceased  . Colon cancer Father 20       Deceased  . Colon cancer Brother   . Prostate cancer Brother        Deceased  . Alzheimer's disease Brother   . Heart disease Brother   . Alzheimer's disease Paternal Grandmother   . Early death Maternal Grandmother        Unknown  . Obesity Son   . Obesity Son        Gastric Bypass  . Healthy Son   . Healthy  Daughter   . Diabetes Neg Hx     Allergies  Allergen Reactions  . Nitroglycerin Other (See Comments)    "I ended up in a fetal position from the pain"  . Nsaids Other (See Comments) and Tinitus    Bruising   . Statins Nausea And Vomiting and Other (See Comments)    Kidney issues and muscle pain   . Tolmetin Other (See Comments)    Bruising  . Ciprofloxacin Other (See Comments)    Achilles tendon issues  .  Ropinirole Anxiety and Other (See Comments)    Mood swings, also  . Requip [Ropinirole Hcl] Anxiety    Medication list has been reviewed and updated.  Current Outpatient Medications on File Prior to Visit  Medication Sig Dispense Refill  . allopurinol (ZYLOPRIM) 100 MG tablet TAKE 2 TABLETS(200 MG) BY MOUTH DAILY (Patient taking differently: Take 200 mg by mouth daily with breakfast. ) 180 tablet 3  . ALPRAZolam (XANAX) 0.5 MG tablet TAKE 1 TABLET(0.5 MG) BY MOUTH THREE TIMES DAILY AS NEEDED FOR ANXIETY (Patient taking differently: Take 1 mg by mouth at bedtime. ) 90 tablet 3  . amoxicillin (AMOXIL) 500 MG capsule Take 4 caps (2gm) by mouth once prior to dental procedure (Patient taking differently: Take 2,000 mg by mouth See admin instructions. Take 2,000 mg by mouth one hour prior to dental procedures) 20 capsule 2  . azelastine (ASTELIN) 0.1 % nasal spray Place 2 sprays into both nostrils 2 (two) times daily. Use in each nostril as directed (Patient taking differently: Place 2 sprays into both nostrils 2 (two) times daily as needed (for seasonal allergies). ) 30 mL 11  . b complex vitamins tablet Take 1 tablet by mouth daily.    . bethanechol (URECHOLINE) 25 MG tablet TAKE 1 TABLET(25 MG) BY MOUTH THREE TIMES DAILY (Patient taking differently: Take 25 mg by mouth 3 (three) times daily. ) 90 tablet 11  . Calcium 500 MG tablet Take 500 mg by mouth daily.     . Cholecalciferol (VITAMIN D3) 2000 UNITS capsule Take 2,000 Units by mouth daily.    . Cyanocobalamin (VITAMIN B-12 PO)  Take 1 tablet by mouth daily after breakfast.    . folic acid (FOLVITE) 1 MG tablet TAKE 2 TABLETS(2 MG) BY MOUTH DAILY 60 tablet 4  . furosemide (LASIX) 40 MG tablet Take 1 tablet (40 mg total) by mouth daily as needed for fluid or edema. 30 tablet 0  . HYDROcodone-acetaminophen (NORCO) 7.5-325 MG tablet Take 1 tablet by mouth every 6 (six) hours as needed for moderate pain. 100 tablet 0  . Magnesium Oxide 200 MG TABS Take 200 mg by mouth daily.     . Melatonin 10 MG TABS Take 20 mg by mouth at bedtime.     . Multiple Vitamin (MULTIVITAMIN) tablet Take 1 tablet by mouth daily.    . Omega-3 Fatty Acids (OMEGA-3 FISH OIL PO) Take 1 capsule by mouth daily. EPA/DHA 520/350     . Polyethyl Glyc-Propyl Glyc PF (SYSTANE ULTRA PF) 0.4-0.3 % SOLN Place 1-2 drops into both eyes 3 (three) times daily as needed (for dryness).     . polyethylene glycol (MIRALAX / GLYCOLAX) packet Take 17 g by mouth daily as needed (into 6-8 ounces of water and drink (in conjunction with Metamucil) once a day only when taking Norco).     . predniSONE (DELTASONE) 20 MG tablet May take 1 tablet daily for up to 3 days as needed for gout attack or muscle spasm (Patient taking differently: Take 20 mg by mouth See admin instructions. Take 20 mg by mouth once a day for up to three days, as directed/as needed for gout attacks or muscle spasms/pain flares) 20 tablet 1  . Psyllium (METAMUCIL PO) Take by mouth See admin instructions. Mix 1 teaspoonful into 6-8 ounces of water and drink (in conjunction with Miralax) once a day only when taking Norco    . Pyridoxine HCl (VITAMIN B-6 PO) Take 1 tablet by mouth daily after breakfast.    .  vitamin C (ASCORBIC ACID) 500 MG tablet Take 1,000 mg by mouth daily.     Marland Kitchen zinc gluconate 50 MG tablet Take 50 mg by mouth daily.    . potassium chloride 20 MEQ TBCR Take 40 mEq by mouth daily for 2 days. 4 tablet 0   No current facility-administered medications on file prior to visit.    Review of  Systems:  As per HPI- otherwise negative.   Physical Examination: Vitals:   10/15/19 1015 10/15/19 1035  BP: (!) 145/67 110/65  Pulse: 82   Resp: 16   Temp: (!) 97.4 F (36.3 C)   SpO2: 96%    Vitals:   10/15/19 1015  Weight: 206 lb (93.4 kg)  Height: 5' 6.5" (1.689 m)   Body mass index is 32.75 kg/m. Ideal Body Weight: Weight in (lb) to have BMI = 25: 156.9  GEN: no acute distress.  Central obesity, looks well and his normal self.  Using a walker right now- his goal is to get back to a cane asap  HEENT: Atraumatic, Normocephalic.  Ears and Nose: No external deformity. CV: RRR, No M/G/R. No JVD. No thrill. No extra heart sounds. PULM: CTA B, no wheezes, crackles, rhonchi. No retractions. No resp. distress. No accessory muscle use. ABD: S, NT, ND, +BS. No rebound. No HSM. EXTR: No c/c/e PSYCH: Normally interactive. Conversant.   BP Readings from Last 3 Encounters:  10/15/19 110/65  10/13/19 133/72  10/07/19 109/80      Assessment and Plan: Muscle spasm - Plan: baclofen (LIORESAL) 10 MG tablet  Chronic pain syndrome  Other polyneuropathy - Plan: gabapentin (NEURONTIN) 300 MG capsule  Hospital discharge follow-up  Acute on chronic diastolic congestive heart failure ([employer])  House here today for follow-up visit.  He was recently seen in the ER with spasm and twitching of his bilateral hands and arms.  No particular explanation for the symptoms was found, and he is now feeling much better.  In fact, he feels better than he has in some time today I refilled baclofen which she may use on occasion for muscle spasm.  I cautioned him that this may cause sedation  He was taking Lyrica for his peripheral neuropathy, but this was too expensive.  He has now changed back to gabapentin.  He is taking 200 mg twice a day  Hal had an episode of congestive heart failure but earlier this year.  He is not currently feeling short of breath, but I have urged him to check his weight at  least every 2 days so he can stay on top of any fluid increase I otherwise manage his chronic pain with hydrocodone, he does not need a refill today  He is asked to see me in 4 to 6 months for routine follow-up, will alert me sooner if any concerns This visit occurred during the SARS-CoV-2 public health emergency.  Safety protocols were in place, including screening questions prior to the visit, additional usage of staff PPE, and extensive cleaning of exam room while observing appropriate contact time as indicated for disinfecting solutions.    Signed Lamar Blinks, MD

## 2019-10-15 NOTE — Patient Instructions (Signed)
Great to see you today!  Please keep me posted as to how you are feeling You can use the baclofen as needed for muscle spasm- be careful of sedation If you are having spasms I would also suggest drinking a gatorade- dehydration may be part of it.    Please see me in 4-6 months assuming you are feeling ok You weight is up just a bit today- please monitor at home in case you are getting fluid overloaded again

## 2019-10-16 ENCOUNTER — Other Ambulatory Visit: Payer: Self-pay | Admitting: Family Medicine

## 2019-10-16 ENCOUNTER — Encounter: Payer: Self-pay | Admitting: Family Medicine

## 2019-10-16 MED ORDER — POTASSIUM CHLORIDE ER 20 MEQ PO TBCR: EXTENDED_RELEASE_TABLET | ORAL | 1 refills | Status: DC

## 2019-10-19 DIAGNOSIS — R293 Abnormal posture: Secondary | ICD-10-CM | POA: Diagnosis not present

## 2019-10-19 DIAGNOSIS — M545 Low back pain: Secondary | ICD-10-CM | POA: Diagnosis not present

## 2019-10-19 DIAGNOSIS — M6281 Muscle weakness (generalized): Secondary | ICD-10-CM | POA: Diagnosis not present

## 2019-10-19 DIAGNOSIS — M5126 Other intervertebral disc displacement, lumbar region: Secondary | ICD-10-CM | POA: Diagnosis not present

## 2019-10-19 DIAGNOSIS — R2689 Other abnormalities of gait and mobility: Secondary | ICD-10-CM | POA: Diagnosis not present

## 2019-10-20 ENCOUNTER — Ambulatory Visit (INDEPENDENT_AMBULATORY_CARE_PROVIDER_SITE_OTHER): Payer: Medicare Other | Admitting: Physical Medicine and Rehabilitation

## 2019-10-20 ENCOUNTER — Other Ambulatory Visit: Payer: Self-pay

## 2019-10-20 ENCOUNTER — Encounter: Payer: Self-pay | Admitting: Physical Medicine and Rehabilitation

## 2019-10-20 VITALS — BP 132/66 | HR 84 | Ht 69.0 in | Wt 199.9 lb

## 2019-10-20 DIAGNOSIS — M7918 Myalgia, other site: Secondary | ICD-10-CM | POA: Diagnosis not present

## 2019-10-20 DIAGNOSIS — M4316 Spondylolisthesis, lumbar region: Secondary | ICD-10-CM

## 2019-10-20 DIAGNOSIS — M545 Low back pain, unspecified: Secondary | ICD-10-CM

## 2019-10-20 DIAGNOSIS — Z96642 Presence of left artificial hip joint: Secondary | ICD-10-CM

## 2019-10-20 DIAGNOSIS — G8929 Other chronic pain: Secondary | ICD-10-CM

## 2019-10-20 DIAGNOSIS — M5116 Intervertebral disc disorders with radiculopathy, lumbar region: Secondary | ICD-10-CM | POA: Diagnosis not present

## 2019-10-20 NOTE — Progress Notes (Signed)
Pt states pain in the lower back that is mostly on the left side. Pt states pain started awhile ago. Moving the wrong way and different positions make pain worse. Hydrocodone helps with pain along with flexiril.   .Numeric Pain Rating Scale and Functional Assessment Average Pain 7 Pain Right Now 3 My pain is constant and aching Pain is worse with: some activites Pain improves with: medication   In the last MONTH (on 0-10 scale) has pain interfered with the following?  1. General activity like being  able to carry out your everyday physical activities such as walking, climbing stairs, carrying groceries, or moving a chair?  Rating(7)  2. Relation with others like being able to carry out your usual social activities and roles such as  activities at home, at work and in your community. Rating(7)  3. Enjoyment of life such that you have  been bothered by emotional problems such as feeling anxious, depressed or irritable?  Rating(4)

## 2019-10-22 ENCOUNTER — Other Ambulatory Visit: Payer: Self-pay

## 2019-10-22 ENCOUNTER — Encounter: Payer: Self-pay | Admitting: Physical Medicine and Rehabilitation

## 2019-10-22 ENCOUNTER — Ambulatory Visit (INDEPENDENT_AMBULATORY_CARE_PROVIDER_SITE_OTHER): Payer: Medicare Other | Admitting: Physical Medicine and Rehabilitation

## 2019-10-22 ENCOUNTER — Ambulatory Visit: Payer: Self-pay

## 2019-10-22 VITALS — BP 102/50 | HR 75

## 2019-10-22 DIAGNOSIS — M5116 Intervertebral disc disorders with radiculopathy, lumbar region: Secondary | ICD-10-CM

## 2019-10-22 MED ORDER — DEXAMETHASONE SODIUM PHOSPHATE 10 MG/ML IJ SOLN: 15.0000 mg | Freq: Once | INTRAMUSCULAR | Status: DC

## 2019-10-22 MED ORDER — PREDNISONE 50 MG PO TABS: ORAL_TABLET | ORAL | 0 refills | Status: DC

## 2019-10-22 NOTE — Progress Notes (Signed)
Mr. Britta Mccreedy came in today for planned epidural injection but unfortunately did not have a driver with him.  He typically has his son drive him to the appointment for injection which he has had in the past but his son is out of town.  He did not arrange for any van pickup from [place] where he lives.  We will have to reschedule his appointment.   Numeric Pain Rating Scale and Functional Assessment Average Pain 7   In the last MONTH (on 0-10 scale) has pain interfered with the following?  1. General activity like being  able to carry out your everyday physical activities such as walking, climbing stairs, carrying groceries, or moving a chair?  Rating(7)   -Driver, -BT, -Dye Allergies.

## 2019-10-23 DIAGNOSIS — M545 Low back pain: Secondary | ICD-10-CM | POA: Diagnosis not present

## 2019-10-23 DIAGNOSIS — R2689 Other abnormalities of gait and mobility: Secondary | ICD-10-CM | POA: Diagnosis not present

## 2019-10-23 DIAGNOSIS — M5126 Other intervertebral disc displacement, lumbar region: Secondary | ICD-10-CM | POA: Diagnosis not present

## 2019-10-23 DIAGNOSIS — M6281 Muscle weakness (generalized): Secondary | ICD-10-CM | POA: Diagnosis not present

## 2019-10-23 DIAGNOSIS — R293 Abnormal posture: Secondary | ICD-10-CM | POA: Diagnosis not present

## 2019-10-26 ENCOUNTER — Telehealth: Payer: Self-pay | Admitting: Family

## 2019-10-26 NOTE — Telephone Encounter (Signed)
I called and LM for patient about appointments for 5/7 being changed due to provider.  I did ask the patient to call back to confirm new times as well.

## 2019-10-30 ENCOUNTER — Inpatient Hospital Stay: Payer: Medicare Other

## 2019-10-30 ENCOUNTER — Other Ambulatory Visit: Payer: Self-pay

## 2019-10-30 ENCOUNTER — Inpatient Hospital Stay: Payer: Medicare Other | Admitting: Family

## 2019-10-30 ENCOUNTER — Inpatient Hospital Stay (HOSPITAL_BASED_OUTPATIENT_CLINIC_OR_DEPARTMENT_OTHER): Payer: Medicare Other | Admitting: Family

## 2019-10-30 ENCOUNTER — Inpatient Hospital Stay: Payer: Medicare Other | Attending: Hematology & Oncology

## 2019-10-30 ENCOUNTER — Encounter: Payer: Self-pay | Admitting: Family

## 2019-10-30 VITALS — BP 114/45 | HR 89 | Temp 96.8°F | Resp 19 | Ht 69.0 in | Wt 206.0 lb

## 2019-10-30 DIAGNOSIS — C8334 Diffuse large B-cell lymphoma, lymph nodes of axilla and upper limb: Secondary | ICD-10-CM | POA: Diagnosis not present

## 2019-10-30 DIAGNOSIS — Z888 Allergy status to other drugs, medicaments and biological substances status: Secondary | ICD-10-CM | POA: Diagnosis not present

## 2019-10-30 DIAGNOSIS — G629 Polyneuropathy, unspecified: Secondary | ICD-10-CM | POA: Insufficient documentation

## 2019-10-30 DIAGNOSIS — Z881 Allergy status to other antibiotic agents status: Secondary | ICD-10-CM | POA: Insufficient documentation

## 2019-10-30 DIAGNOSIS — C9310 Chronic myelomonocytic leukemia not having achieved remission: Secondary | ICD-10-CM

## 2019-10-30 DIAGNOSIS — M545 Low back pain: Secondary | ICD-10-CM | POA: Diagnosis not present

## 2019-10-30 DIAGNOSIS — Z886 Allergy status to analgesic agent status: Secondary | ICD-10-CM | POA: Diagnosis not present

## 2019-10-30 DIAGNOSIS — Z79899 Other long term (current) drug therapy: Secondary | ICD-10-CM | POA: Insufficient documentation

## 2019-10-30 DIAGNOSIS — D6481 Anemia due to antineoplastic chemotherapy: Secondary | ICD-10-CM | POA: Diagnosis not present

## 2019-10-30 DIAGNOSIS — D508 Other iron deficiency anemias: Secondary | ICD-10-CM

## 2019-10-30 DIAGNOSIS — T451X5A Adverse effect of antineoplastic and immunosuppressive drugs, initial encounter: Secondary | ICD-10-CM | POA: Diagnosis not present

## 2019-10-30 LAB — CMP (CANCER CENTER ONLY)
ALT: 10 U/L (ref 0–44)
AST: 14 U/L — ABNORMAL LOW (ref 15–41)
Albumin: 3.9 g/dL (ref 3.5–5.0)
Alkaline Phosphatase: 54 U/L (ref 38–126)
Anion gap: 9 (ref 5–15)
BUN: 33 mg/dL — ABNORMAL HIGH (ref 8–23)
CO2: 29 mmol/L (ref 22–32)
Calcium: 9.7 mg/dL (ref 8.9–10.3)
Chloride: 102 mmol/L (ref 98–111)
Creatinine: 1.85 mg/dL — ABNORMAL HIGH (ref 0.61–1.24)
GFR, Est AFR Am: 37 mL/min — ABNORMAL LOW (ref >60)
GFR, Estimated: 32 mL/min — ABNORMAL LOW (ref >60)
Glucose, Bld: 148 mg/dL — ABNORMAL HIGH (ref 70–99)
Potassium: 3.7 mmol/L (ref 3.5–5.1)
Sodium: 140 mmol/L (ref 135–145)
Total Bilirubin: 1 mg/dL (ref 0.3–1.2)
Total Protein: 6.8 g/dL (ref 6.5–8.1)

## 2019-10-30 LAB — CBC WITH DIFFERENTIAL (CANCER CENTER ONLY)
Abs Immature Granulocytes: 0.32 10*3/uL — ABNORMAL HIGH (ref 0.00–0.07)
Basophils Absolute: 0 10*3/uL (ref 0.0–0.1)
Basophils Relative: 0 %
Eosinophils Absolute: 0 10*3/uL (ref 0.0–0.5)
Eosinophils Relative: 0 %
HCT: 27 % — ABNORMAL LOW (ref 39.0–52.0)
Hemoglobin: 8.8 g/dL — ABNORMAL LOW (ref 13.0–17.0)
Immature Granulocytes: 4 %
Lymphocytes Relative: 35 %
Lymphs Abs: 2.6 10*3/uL (ref 0.7–4.0)
MCH: 38.4 pg — ABNORMAL HIGH (ref 26.0–34.0)
MCHC: 32.6 g/dL (ref 30.0–36.0)
MCV: 117.9 fL — ABNORMAL HIGH (ref 80.0–100.0)
Monocytes Absolute: 2.2 10*3/uL — ABNORMAL HIGH (ref 0.1–1.0)
Monocytes Relative: 29 %
Neutro Abs: 2.4 10*3/uL (ref 1.7–7.7)
Neutrophils Relative %: 32 %
Platelet Count: 98 10*3/uL — ABNORMAL LOW (ref 150–400)
RBC: 2.29 MIL/uL — ABNORMAL LOW (ref 4.22–5.81)
RDW: 16.3 % — ABNORMAL HIGH (ref 11.5–15.5)
WBC Count: 7.6 10*3/uL (ref 4.0–10.5)
nRBC: 0 % (ref 0.0–0.2)

## 2019-10-30 LAB — SAVE SMEAR(SSMR), FOR PROVIDER SLIDE REVIEW

## 2019-10-30 LAB — RETICULOCYTES
Immature Retic Fract: 26.3 % — ABNORMAL HIGH (ref 2.3–15.9)
RBC.: 2.32 MIL/uL — ABNORMAL LOW (ref 4.22–5.81)
Retic Count, Absolute: 61.2 10*3/uL (ref 19.0–186.0)
Retic Ct Pct: 2.6 % (ref 0.4–3.1)

## 2019-10-30 MED ORDER — DARBEPOETIN ALFA 500 MCG/ML IJ SOSY
500.0000 ug | PREFILLED_SYRINGE | Freq: Once | INTRAMUSCULAR | Status: AC
  Administered 2019-10-30: 500 ug via SUBCUTANEOUS

## 2019-10-30 NOTE — Progress Notes (Signed)
Hematology and Oncology Follow Up Visit  Jeremiah Walker [id number] 06-04-1928 86 y.o. 10/30/2019   Principle Diagnosis:  History of diffuse large cell non-Hodgkin's lymphoma-treated in [place] Chronic Myelomonocytic Leukemia (CMMoL) - Normal cytogenetics  Current Therapy:        Aranesp 500 mcg sq q 3 weeks for Hgb < 11 Folic acid 2 mg PO daily   Interim History:  Jeremiah Walker is here today for follow-up and Aranesp. He is doing fairly well but having a lot of lower back pain that radiates around his hips. He has an appointment Monday with Dr. Laurence Walker for injection. He states that this always gives him relief.  He is ready to be able to get back in the pool and exercise.  No falls or syncopal episodes to report. He ambulates with a cane for added support.  No fever, chills, n/v, cough, rash, dizziness, chest pain, palpitations, abdominal pain or changes in bowel or bladder habits. The neuropathy in his hands and feet is unchanged.  No swelling in his extremities at this time.  He has fluid build up at times around the abdomen which can cause mild SOB with exertion. He states that he weighs daily and has lasix to take as needed which helps. He has maintained a good appetite and is hydrating properly. His weight is stable.   ECOG Performance Status: 1 - Symptomatic but completely ambulatory  Medications:  Allergies as of 10/30/2019      Reactions   Nitroglycerin Other (See Comments)   "I ended up in a fetal position from the pain"   Nsaids Other (See Comments), Tinitus   Bruising   Statins Nausea And Vomiting, Other (See Comments)   Kidney issues and muscle pain   Tolmetin Other (See Comments)   Bruising   Ciprofloxacin Other (See Comments)   Achilles tendon issues   Ropinirole Anxiety, Other (See Comments)   Mood swings, also   Requip [ropinirole Hcl] Anxiety      Medication List       Accurate as of Oct 30, 2019  2:25 PM. If you have any questions, ask your nurse or  doctor.        allopurinol 100 MG tablet Commonly known as: ZYLOPRIM TAKE 2 TABLETS(200 MG) BY MOUTH DAILY What changed:   how much to take  how to take this  when to take this  additional instructions   ALPRAZolam 0.5 MG tablet Commonly known as: XANAX TAKE 1 TABLET(0.5 MG) BY MOUTH THREE TIMES DAILY AS NEEDED FOR ANXIETY What changed:   how much to take  how to take this  when to take this  additional instructions   amoxicillin 500 MG capsule Commonly known as: AMOXIL Take 4 caps (2gm) by mouth once prior to dental procedure What changed:   how much to take  how to take this  when to take this  additional instructions   azelastine 0.1 % nasal spray Commonly known as: ASTELIN Place 2 sprays into both nostrils 2 (two) times daily. Use in each nostril as directed What changed:   when to take this  reasons to take this  additional instructions   b complex vitamins tablet Take 1 tablet by mouth daily.   baclofen 10 MG tablet Commonly known as: LIORESAL Take 1/2 to 1 by mouth every 8hrs as needed for spasm   bethanechol 25 MG tablet Commonly known as: URECHOLINE TAKE 1 TABLET(25 MG) BY MOUTH THREE TIMES DAILY What changed:   how much  to take  how to take this  when to take this  additional instructions   Calcium 500 MG tablet Take 500 mg by mouth daily.   folic acid 1 MG tablet Commonly known as: FOLVITE TAKE 2 TABLETS(2 MG) BY MOUTH DAILY   furosemide 40 MG tablet Commonly known as: Lasix Take 1 tablet (40 mg total) by mouth daily as needed for fluid or edema.   gabapentin 300 MG capsule Commonly known as: NEURONTIN TAKE 3 CAPSULES BY MOUTH THREE TIMES DAILY, PLUS 2 CAPSULES EVERY DAY AS NEEDED   HYDROcodone-acetaminophen 7.5-325 MG tablet Commonly known as: NORCO Take 1 tablet by mouth every 6 (six) hours as needed for moderate pain.   Magnesium Oxide 200 MG Tabs Take 200 mg by mouth daily.   Melatonin 10 MG Tabs Take 20  mg by mouth at bedtime.   METAMUCIL PO Take by mouth See admin instructions. Mix 1 teaspoonful into 6-8 ounces of water and drink (in conjunction with Miralax) once a day only when taking Norco   multivitamin tablet Take 1 tablet by mouth daily.   OMEGA-3 FISH OIL PO Take 1 capsule by mouth daily. EPA/DHA 520/350   polyethylene glycol 17 g packet Commonly known as: MIRALAX / GLYCOLAX Take 17 g by mouth daily as needed (into 6-8 ounces of water and drink (in conjunction with Metamucil) once a day only when taking Norco).   Potassium Chloride ER 20 MEQ Tbcr Take 1 daily as needed with furosemide dose   predniSONE 20 MG tablet Commonly known as: DELTASONE May take 1 tablet daily for up to 3 days as needed for gout attack or muscle spasm What changed:   how much to take  how to take this  when to take this  additional instructions   predniSONE 50 MG tablet Commonly known as: DELTASONE Take 1 tablet daily with food for 5 days until finished What changed: Another medication with the same name was changed. Make sure you understand how and when to take each.   Systane Ultra PF 0.4-0.3 % Soln Generic drug: Polyethyl Glyc-Propyl Glyc PF Place 1-2 drops into both eyes 3 (three) times daily as needed (for dryness).   VITAMIN B-12 PO Take 1 tablet by mouth daily after breakfast.   VITAMIN B-6 PO Take 1 tablet by mouth daily after breakfast.   vitamin C 500 MG tablet Commonly known as: ASCORBIC ACID Take 1,000 mg by mouth daily.   Vitamin D3 50 MCG (2000 UT) capsule Take 2,000 Units by mouth daily.   zinc gluconate 50 MG tablet Take 50 mg by mouth daily.       Allergies:  Allergies  Allergen Reactions  . Nitroglycerin Other (See Comments)    "I ended up in a fetal position from the pain"  . Nsaids Other (See Comments) and Tinitus    Bruising   . Statins Nausea And Vomiting and Other (See Comments)    Kidney issues and muscle pain   . Tolmetin Other (See  Comments)    Bruising  . Ciprofloxacin Other (See Comments)    Achilles tendon issues  . Ropinirole Anxiety and Other (See Comments)    Mood swings, also  . Requip [Ropinirole Hcl] Anxiety    Past Medical History, Surgical history, Social history, and Family History were reviewed and updated.  Review of Systems: All other 10 point review of systems is negative.   Physical Exam:  vitals were not taken for this visit.   Wt Readings from Last  3 Encounters:  10/20/19 199 lb 14.4 oz (90.7 kg)  10/15/19 206 lb (93.4 kg)  10/13/19 202 lb (91.6 kg)    Ocular: Sclerae unicteric, pupils equal, round and reactive to light Ear-nose-throat: Oropharynx clear, dentition fair Lymphatic: No cervical or supraclavicular adenopathy Lungs no rales or rhonchi, good excursion bilaterally Heart regular rate and rhythm, no murmur appreciated Abd soft, nontender, positive bowel sounds, no liver or spleen tip palpated on exam, no fluid wave  MSK no focal spinal tenderness, no joint edema Neuro: non-focal, well-oriented, appropriate affect Breasts: Deferred   Lab Results  Component Value Date   WBC 7.6 10/30/2019   HGB 8.8 (L) 10/30/2019   HCT 27.0 (L) 10/30/2019   MCV 117.9 (H) 10/30/2019   PLT 98 (L) 10/30/2019   Lab Results  Component Value Date   FERRITIN 329 10/07/2019   IRON 105 10/07/2019   TIBC 236 10/07/2019   UIBC 131 10/07/2019   IRONPCTSAT 44 10/07/2019   Lab Results  Component Value Date   RETICCTPCT 2.6 10/30/2019   RBC 2.29 (L) 10/30/2019   RBC 2.32 (L) 10/30/2019   No results found for: KPAFRELGTCHN, LAMBDASER, KAPLAMBRATIO No results found for: IGGSERUM, IGA, IGMSERUM No results found for: Odetta Pink, SPEI   Chemistry      Component Value Date/Time   NA 138 10/13/2019 1614   NA 139 10/06/2019 1240   NA 139 05/08/2017 1123   NA 141 05/15/2016 1126   K 3.7 10/13/2019 1614   K 4.0 05/08/2017 1123   K 4.3  05/15/2016 1126   CL 105 10/13/2019 1614   CL 105 05/08/2017 1123   CO2 24 10/13/2019 1614   CO2 30 05/08/2017 1123   CO2 26 05/15/2016 1126   BUN 25 (H) 10/13/2019 1614   BUN 25 10/06/2019 1240   BUN 15 05/08/2017 1123   BUN 29.1 (H) 05/15/2016 1126   CREATININE 1.64 (H) 10/13/2019 1614   CREATININE 1.63 (H) 10/07/2019 1444   CREATININE 1.2 05/08/2017 1123   CREATININE 1.7 (H) 05/15/2016 1126   GLU 90 11/15/2014 0000      Component Value Date/Time   CALCIUM 9.0 10/13/2019 1614   CALCIUM 9.1 05/08/2017 1123   CALCIUM 9.7 05/15/2016 1126   ALKPHOS 55 10/13/2019 1614   ALKPHOS 64 05/08/2017 1123   ALKPHOS 69 05/15/2016 1126   AST 18 10/13/2019 1614   AST 15 10/07/2019 1444   AST 23 05/15/2016 1126   ALT 14 10/13/2019 1614   ALT 9 10/07/2019 1444   ALT 19 05/08/2017 1123   ALT 22 05/15/2016 1126   BILITOT 0.7 10/13/2019 1614   BILITOT 0.8 10/07/2019 1444   BILITOT 0.84 05/15/2016 1126       Impression and Plan: JeremiahVincentis a 86yo caucasian gentleman withhistory of diffuse large cell non-Hodgkin's lymphomatreated with6 cycles of R-CHOP completed in April 2014.  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 He received Aranesp today for Hgb 8.8.  We will recheck labs and do an injection in 3 weeks and follow-up in 6 weeks.  He will contact our office with any questions or concerns. We can certainly see him sooner if needed.   Laverna Peace, NP 5/7/20212:25 PM

## 2019-10-30 NOTE — Patient Instructions (Signed)
Darbepoetin Alfa injection What is this medicine? DARBEPOETIN ALFA (dar be POE e tin AL fa) helps your body make more red blood cells. It is used to treat anemia caused by chronic kidney failure and chemotherapy. This medicine may be used for other purposes; ask your health care provider or pharmacist if you have questions. COMMON BRAND NAME(S): Aranesp What should I tell my health care provider before I take this medicine? They need to know if you have any of these conditions:  blood clotting disorders or history of blood clots  cancer patient not on chemotherapy  cystic fibrosis  heart disease, such as angina, heart failure, or a history of a heart attack  hemoglobin level of 12 g/dL or greater  high blood pressure  low levels of folate, iron, or vitamin B12  seizures  an unusual or allergic reaction to darbepoetin, erythropoietin, albumin, hamster proteins, latex, other medicines, foods, dyes, or preservatives  pregnant or trying to get pregnant  breast-feeding How should I use this medicine? This medicine is for injection into a vein or under the skin. It is usually given by a health care professional in a hospital or clinic setting. If you get this medicine at home, you will be taught how to prepare and give this medicine. Use exactly as directed. Take your medicine at regular intervals. Do not take your medicine more often than directed. It is important that you put your used needles and syringes in a special sharps container. Do not put them in a trash can. If you do not have a sharps container, call your pharmacist or healthcare provider to get one. A special MedGuide will be given to you by the pharmacist with each prescription and refill. Be sure to read this information carefully each time. Talk to your pediatrician regarding the use of this medicine in children. While this medicine may be used in children as young as 1 month of age for selected conditions, precautions do  apply. Overdosage: If you think you have taken too much of this medicine contact a poison control center or emergency room at once. NOTE: This medicine is only for you. Do not share this medicine with others. What if I miss a dose? If you miss a dose, take it as soon as you can. If it is almost time for your next dose, take only that dose. Do not take double or extra doses. What may interact with this medicine? Do not take this medicine with any of the following medications:  epoetin alfa This list may not describe all possible interactions. Give your health care provider a list of all the medicines, herbs, non-prescription drugs, or dietary supplements you use. Also tell them if you smoke, drink alcohol, or use illegal drugs. Some items may interact with your medicine. What should I watch for while using this medicine? Your condition will be monitored carefully while you are receiving this medicine. You may need blood work done while you are taking this medicine. This medicine may cause a decrease in vitamin B6. You should make sure that you get enough vitamin B6 while you are taking this medicine. Discuss the foods you eat and the vitamins you take with your health care professional. What side effects may I notice from receiving this medicine? Side effects that you should report to your doctor or health care professional as soon as possible:  allergic reactions like skin rash, itching or hives, swelling of the face, lips, or tongue  breathing problems  changes in   vision  chest pain  confusion, trouble speaking or understanding  feeling faint or lightheaded, falls  high blood pressure  muscle aches or pains  pain, swelling, warmth in the leg  rapid weight gain  severe headaches  sudden numbness or weakness of the face, arm or leg  trouble walking, dizziness, loss of balance or coordination  seizures (convulsions)  swelling of the ankles, feet, hands  unusually weak or  tired Side effects that usually do not require medical attention (report to your doctor or health care professional if they continue or are bothersome):  diarrhea  fever, chills (flu-like symptoms)  headaches  nausea, vomiting  redness, stinging, or swelling at site where injected This list may not describe all possible side effects. Call your doctor for medical advice about side effects. You may report side effects to FDA at 1-800-FDA-1088. Where should I keep my medicine? Keep out of the reach of children. Store in a refrigerator between 2 and 8 degrees C (36 and 46 degrees F). Do not freeze. Do not shake. Throw away any unused portion if using a single-dose vial. Throw away any unused medicine after the expiration date. NOTE: This sheet is a summary. It may not cover all possible information. If you have questions about this medicine, talk to your doctor, pharmacist, or health care provider.  2020 Elsevier/Gold Standard (2017-06-26 16:44:20)  

## 2019-11-02 ENCOUNTER — Ambulatory Visit: Payer: Self-pay

## 2019-11-02 ENCOUNTER — Encounter: Payer: Self-pay | Admitting: Physical Medicine and Rehabilitation

## 2019-11-02 ENCOUNTER — Encounter: Payer: Self-pay | Admitting: Family Medicine

## 2019-11-02 ENCOUNTER — Other Ambulatory Visit: Payer: Self-pay

## 2019-11-02 ENCOUNTER — Telehealth: Payer: Self-pay | Admitting: Family

## 2019-11-02 ENCOUNTER — Ambulatory Visit (INDEPENDENT_AMBULATORY_CARE_PROVIDER_SITE_OTHER): Payer: Medicare Other | Admitting: Physical Medicine and Rehabilitation

## 2019-11-02 VITALS — BP 126/63 | HR 88

## 2019-11-02 DIAGNOSIS — M545 Low back pain, unspecified: Secondary | ICD-10-CM

## 2019-11-02 DIAGNOSIS — M5416 Radiculopathy, lumbar region: Secondary | ICD-10-CM | POA: Diagnosis not present

## 2019-11-02 DIAGNOSIS — M5116 Intervertebral disc disorders with radiculopathy, lumbar region: Secondary | ICD-10-CM

## 2019-11-02 DIAGNOSIS — G8929 Other chronic pain: Secondary | ICD-10-CM

## 2019-11-02 DIAGNOSIS — G894 Chronic pain syndrome: Secondary | ICD-10-CM

## 2019-11-02 LAB — IRON AND TIBC
Iron: 114 ug/dL (ref 42–163)
Saturation Ratios: 47 % (ref 20–55)
TIBC: 241 ug/dL (ref 202–409)
UIBC: 127 ug/dL (ref 117–376)

## 2019-11-02 LAB — LACTATE DEHYDROGENASE: LDH: 196 U/L — ABNORMAL HIGH (ref 98–192)

## 2019-11-02 LAB — FERRITIN: Ferritin: 341 ng/mL — ABNORMAL HIGH (ref 24–336)

## 2019-11-02 MED ORDER — DEXAMETHASONE SODIUM PHOSPHATE 10 MG/ML IJ SOLN
15.0000 mg | Freq: Once | INTRAMUSCULAR | Status: AC
  Administered 2019-11-02: 15 mg

## 2019-11-02 NOTE — Telephone Encounter (Signed)
I called and spoke with patient regarding appointments that were added.  He was ok with both dates ^ times/ per 5/7 los

## 2019-11-02 NOTE — Progress Notes (Signed)
Pt states pain is located on the left side of the lower back. Pt states bending and twisting towards the right makes pain worse. Nothing helps with pain.   .Numeric Pain Rating Scale and Functional Assessment Average Pain 3   In the last MONTH (on 0-10 scale) has pain interfered with the following?  1. General activity like being  able to carry out your everyday physical activities such as walking, climbing stairs, carrying groceries, or moving a chair?  Rating(4)   +Driver, -BT, -Dye Allergies.

## 2019-11-02 NOTE — Procedures (Signed)
Lumbosacral Transforaminal Epidural Steroid Injection - Sub-Pedicular Approach with Fluoroscopic Guidance  Patient: Jeremiah Walker      Date of Birth: [birth year] MRN: [id number] PCP: [name], MD      Visit Date: 11/02/2019   Universal Protocol:    Date/Time: 11/02/2019  Consent Given By: the patient  Position: PRONE  Additional Comments: Vital signs were monitored before and after the procedure. Patient was prepped and draped in the usual sterile fashion. The correct patient, procedure, and site was verified.   Injection Procedure Details:  Procedure Site One Meds Administered:  Meds ordered this encounter  Medications  . dexamethasone (DECADRON) injection 15 mg    Laterality: Left  Location/Site:  L2-L3  Needle size: 22 G  Needle type: Spinal  Needle Placement: Transforaminal  Findings:    -Comments: Excellent flow of contrast along the nerve and into the epidural space.  Procedure Details: After squaring off the end-plates to get a true AP view, the C-arm was positioned so that an oblique view of the foramen as noted above was visualized. The target area is just inferior to the "nose of the scotty dog" or sub pedicular. The soft tissues overlying this structure were infiltrated with 2-3 ml. of 1% Lidocaine without Epinephrine.  The spinal needle was inserted toward the target using a "trajectory" view along the fluoroscope beam.  Under AP and lateral visualization, the needle was advanced so it did not puncture dura and was located close the 6 O'Clock position of the pedical in AP tracterory. Biplanar projections were used to confirm position. Aspiration was confirmed to be negative for CSF and/or blood. A 1-2 ml. volume of Isovue-250 was injected and flow of contrast was noted at each level. Radiographs were obtained for documentation purposes.   After attaining the desired flow of contrast documented above, a 0.5 to 1.0 ml test dose of 0.25% Marcaine  was injected into each respective transforaminal space.  The patient was observed for 90 seconds post injection.  After no sensory deficits were reported, and normal lower extremity motor function was noted,   the above injectate was administered so that equal amounts of the injectate were placed at each foramen (level) into the transforaminal epidural space.   Additional Comments:  The patient tolerated the procedure well Dressing: 2 x 2 sterile gauze and Band-Aid    Post-procedure details: Patient was observed during the procedure. Post-procedure instructions were reviewed.  Patient left the clinic in stable condition.

## 2019-11-02 NOTE — Progress Notes (Signed)
Jeremiah Walker - 86 y.o. male MRN [id number]  Date of birth: [birth year]  Office Visit Note: Visit Date: 11/02/2019 PCP: [name], MD Referred by: [name], MD  Subjective: Chief Complaint  Patient presents with  . Lower Back - Pain   HPI:  Jeremiah Walker is a 86 y.o. male who comes in today for planned Left L2-L3 lumbar transforaminal epidural steroid injection with fluoroscopic guidance.  The patient has failed conservative care including home exercise, medications, time and activity modification.  This injection will be diagnostic and hopefully therapeutic.  Please see requesting physician notes for further details and justification.   ROS Otherwise per HPI.  Assessment & Plan: Visit Diagnoses:  1. Lumbar radiculopathy   2. Radiculopathy due to lumbar intervertebral disc disorder   3. Chronic left-sided low back pain without sciatica     Plan: No additional findings.   Meds & Orders:  Meds ordered this encounter  Medications  . dexamethasone (DECADRON) injection 15 mg    Orders Placed This Encounter  Procedures  . XR C-ARM NO REPORT  . Epidural Steroid injection    Follow-up: Return if symptoms worsen or fail to improve.   Procedures: No procedures performed  Lumbosacral Transforaminal Epidural Steroid Injection - Sub-Pedicular Approach with Fluoroscopic Guidance  Patient: Jeremiah Walker      Date of Birth: January 09, 1928 MRN: [id number] PCP: [name], MD      Visit Date: 11/02/2019   Universal Protocol:    Date/Time: 11/02/2019  Consent Given By: the patient  Position: PRONE  Additional Comments: Vital signs were monitored before and after the procedure. Patient was prepped and draped in the usual sterile fashion. The correct patient, procedure, and site was verified.   Injection Procedure Details:  Procedure Site One Meds Administered:  Meds ordered this encounter  Medications  . dexamethasone (DECADRON) injection 15  mg    Laterality: Left  Location/Site:  L2-L3  Needle size: 22 G  Needle type: Spinal  Needle Placement: Transforaminal  Findings:    -Comments: Excellent flow of contrast along the nerve and into the epidural space.  Procedure Details: After squaring off the end-plates to get a true AP view, the C-arm was positioned so that an oblique view of the foramen as noted above was visualized. The target area is just inferior to the "nose of the scotty dog" or sub pedicular. The soft tissues overlying this structure were infiltrated with 2-3 ml. of 1% Lidocaine without Epinephrine.  The spinal needle was inserted toward the target using a "trajectory" view along the fluoroscope beam.  Under AP and lateral visualization, the needle was advanced so it did not puncture dura and was located close the 6 O'Clock position of the pedical in AP tracterory. Biplanar projections were used to confirm position. Aspiration was confirmed to be negative for CSF and/or blood. A 1-2 ml. volume of Isovue-250 was injected and flow of contrast was noted at each level. Radiographs were obtained for documentation purposes.   After attaining the desired flow of contrast documented above, a 0.5 to 1.0 ml test dose of 0.25% Marcaine was injected into each respective transforaminal space.  The patient was observed for 90 seconds post injection.  After no sensory deficits were reported, and normal lower extremity motor function was noted,   the above injectate was administered so that equal amounts of the injectate were placed at each foramen (level) into the transforaminal epidural space.   Additional Comments:  The  patient tolerated the procedure well Dressing: 2 x 2 sterile gauze and Band-Aid    Post-procedure details: Patient was observed during the procedure. Post-procedure instructions were reviewed.  Patient left the clinic in stable condition.     Clinical History: MRI LUMBAR SPINE WITHOUT  CONTRAST  TECHNIQUE: Multiplanar, multisequence MR imaging of the lumbar spine was performed. No intravenous contrast was administered.  COMPARISON:  MRI lumbar spine 05/29/2016. Plain films lumbar spine 04/23/2016.  FINDINGS: Segmentation:  Standard.  Alignment: Unchanged. Again seen is mild convex left scoliosis and 0.5 cm anterolisthesis L4 on L5.  Vertebrae: No fracture, evidence of discitis, or bone lesion. Degenerative endplate signal change X5-4, L2-3 and L3-4 again seen.  Conus medullaris and cauda equina: Conus extends to the T12-L1 level. Conus and cauda equina appear normal.  Paraspinal and other soft tissues: Renal cyst noted.  Disc levels:  T10-11 and T11-12 are imaged in the sagittal plane only and negative.  T12-L1: Minimal disc bulge.  No stenosis.  L1-2: There is a large disc extrusion extending cephalad in a left paracentral and lateral recess position. The fragment measures approximately 2.5 cm craniocaudal by 0.8 cm AP by 1.5 cm transverse and extends almost to the T12-L1 interspace. The disc deforms the left aspect of the thecal sac and impinges on the left L1 root. It also causes narrowing in the left subarticular recess at the level of the L1-2 interspace and could impact the left L2 root. Moderate left foraminal narrowing is present. Right foramen open.  L2-3: Loss of disc space height with a shallow bulge and endplate spur. Moderate facet arthropathy. There is mild narrowing in the left lateral recess. Mild bilateral foraminal narrowing. No change.  L3-4: Mild-to-moderate bilateral facet degenerative change. Shallow disc bulge. The central canal and left foramen are open. Mild right foraminal narrowing. No change.  L4-5: Bilateral facet degenerative disease is worse on the right. The disc is uncovered with a shallow bulge. The central canal and left foramen are open. There is moderate right foraminal narrowing. No  change.  L5-S1: Advanced bilateral facet degenerative change. Shallow disc bulge. No stenosis.  IMPRESSION: Large cephalad extending disc extrusion in a left paracentral and lateral recess position at L1-2 extends nearly to the T12-L1 interspace and impinges on the descending left L1 root. The disc also likely impacts the left L2 root at the level of the L1-2 disc interspace. There is moderate left foraminal narrowing at L1-2. The appearance of the lumbar spine is otherwise unchanged.  Mild left lateral recess narrowing at L2-3 due to a disc bulge.  Moderate right foraminal narrowing L4-5.   Electronically Signed   By: Inge Rise M.D.   On: 08/19/2019 13:31     Objective:  VS:  HT:    WT:   BMI:     BP:126/63  HR:88bpm  TEMP: ( )  RESP:  Physical Exam Constitutional:      General: He is not in acute distress.    Appearance: Normal appearance. He is not ill-appearing.  HENT:     Head: Normocephalic and atraumatic.     Right Ear: External ear normal.     Left Ear: External ear normal.  Eyes:     Extraocular Movements: Extraocular movements intact.  Cardiovascular:     Rate and Rhythm: Normal rate.     Pulses: Normal pulses.  Abdominal:     General: There is no distension.     Palpations: Abdomen is soft.  Musculoskeletal:  General: No tenderness or signs of injury.     Right lower leg: No edema.     Left lower leg: No edema.     Comments: Patient has good distal strength without clonus. Ambulates with a cane.  Skin:    Findings: No erythema or rash.  Neurological:     General: No focal deficit present.     Mental Status: He is alert and oriented to person, place, and time.     Sensory: No sensory deficit.     Motor: No weakness or abnormal muscle tone.     Coordination: Coordination normal.  Psychiatric:        Mood and Affect: Mood normal.        Behavior: Behavior normal.      Imaging: XR C-ARM NO REPORT  Result Date:  11/02/2019 Please see Notes tab for imaging impression.

## 2019-11-03 MED ORDER — HYDROCODONE-ACETAMINOPHEN 7.5-325 MG PO TABS: 1.0000 | ORAL_TABLET | Freq: Four times a day (QID) | ORAL | 0 refills | Status: DC | PRN

## 2019-11-13 DIAGNOSIS — H40013 Open angle with borderline findings, low risk, bilateral: Secondary | ICD-10-CM | POA: Diagnosis not present

## 2019-11-13 DIAGNOSIS — H353131 Nonexudative age-related macular degeneration, bilateral, early dry stage: Secondary | ICD-10-CM | POA: Diagnosis not present

## 2019-11-13 DIAGNOSIS — Z961 Presence of intraocular lens: Secondary | ICD-10-CM | POA: Diagnosis not present

## 2019-11-13 DIAGNOSIS — H04123 Dry eye syndrome of bilateral lacrimal glands: Secondary | ICD-10-CM | POA: Diagnosis not present

## 2019-11-19 ENCOUNTER — Other Ambulatory Visit: Payer: Self-pay

## 2019-11-19 DIAGNOSIS — C9310 Chronic myelomonocytic leukemia not having achieved remission: Secondary | ICD-10-CM

## 2019-11-20 ENCOUNTER — Inpatient Hospital Stay: Payer: Medicare Other

## 2019-11-20 ENCOUNTER — Other Ambulatory Visit: Payer: Self-pay

## 2019-11-20 ENCOUNTER — Other Ambulatory Visit: Payer: Self-pay | Admitting: *Deleted

## 2019-11-20 VITALS — BP 94/44 | HR 100 | Temp 97.5°F | Resp 20

## 2019-11-20 DIAGNOSIS — G629 Polyneuropathy, unspecified: Secondary | ICD-10-CM | POA: Diagnosis not present

## 2019-11-20 DIAGNOSIS — C9310 Chronic myelomonocytic leukemia not having achieved remission: Secondary | ICD-10-CM

## 2019-11-20 DIAGNOSIS — T451X5A Adverse effect of antineoplastic and immunosuppressive drugs, initial encounter: Secondary | ICD-10-CM

## 2019-11-20 DIAGNOSIS — M545 Low back pain: Secondary | ICD-10-CM | POA: Diagnosis not present

## 2019-11-20 DIAGNOSIS — D508 Other iron deficiency anemias: Secondary | ICD-10-CM

## 2019-11-20 DIAGNOSIS — D6481 Anemia due to antineoplastic chemotherapy: Secondary | ICD-10-CM

## 2019-11-20 DIAGNOSIS — Z79899 Other long term (current) drug therapy: Secondary | ICD-10-CM | POA: Diagnosis not present

## 2019-11-20 DIAGNOSIS — Z881 Allergy status to other antibiotic agents status: Secondary | ICD-10-CM | POA: Diagnosis not present

## 2019-11-20 LAB — CBC WITH DIFFERENTIAL (CANCER CENTER ONLY)
Abs Immature Granulocytes: 0.14 10*3/uL — ABNORMAL HIGH (ref 0.00–0.07)
Basophils Absolute: 0.1 10*3/uL (ref 0.0–0.1)
Basophils Relative: 1 %
Eosinophils Absolute: 0.2 10*3/uL (ref 0.0–0.5)
Eosinophils Relative: 2 %
HCT: 26.5 % — ABNORMAL LOW (ref 39.0–52.0)
Hemoglobin: 8.6 g/dL — ABNORMAL LOW (ref 13.0–17.0)
Immature Granulocytes: 2 %
Lymphocytes Relative: 30 %
Lymphs Abs: 2 10*3/uL (ref 0.7–4.0)
MCH: 38.6 pg — ABNORMAL HIGH (ref 26.0–34.0)
MCHC: 32.5 g/dL (ref 30.0–36.0)
MCV: 118.8 fL — ABNORMAL HIGH (ref 80.0–100.0)
Monocytes Absolute: 1.6 10*3/uL — ABNORMAL HIGH (ref 0.1–1.0)
Monocytes Relative: 23 %
Neutro Abs: 2.8 10*3/uL (ref 1.7–7.7)
Neutrophils Relative %: 42 %
Platelet Count: 83 10*3/uL — ABNORMAL LOW (ref 150–400)
RBC: 2.23 MIL/uL — ABNORMAL LOW (ref 4.22–5.81)
RDW: 16.4 % — ABNORMAL HIGH (ref 11.5–15.5)
WBC Count: 6.7 10*3/uL (ref 4.0–10.5)
nRBC: 0 % (ref 0.0–0.2)

## 2019-11-20 MED ORDER — DARBEPOETIN ALFA 500 MCG/ML IJ SOSY
500.0000 ug | PREFILLED_SYRINGE | Freq: Once | INTRAMUSCULAR | Status: AC
  Administered 2019-11-20: 500 ug via SUBCUTANEOUS

## 2019-11-20 MED ORDER — DARBEPOETIN ALFA 500 MCG/ML IJ SOSY
PREFILLED_SYRINGE | INTRAMUSCULAR | Status: AC
  Filled 2019-11-20: qty 1

## 2019-11-20 NOTE — Patient Instructions (Signed)
Darbepoetin Alfa injection What is this medicine? DARBEPOETIN ALFA (dar be POE e tin AL fa) helps your body make more red blood cells. It is used to treat anemia caused by chronic kidney failure and chemotherapy. This medicine may be used for other purposes; ask your health care provider or pharmacist if you have questions. COMMON BRAND NAME(S): Aranesp What should I tell my health care provider before I take this medicine? They need to know if you have any of these conditions:  blood clotting disorders or history of blood clots  cancer patient not on chemotherapy  cystic fibrosis  heart disease, such as angina, heart failure, or a history of a heart attack  hemoglobin level of 12 g/dL or greater  high blood pressure  low levels of folate, iron, or vitamin B12  seizures  an unusual or allergic reaction to darbepoetin, erythropoietin, albumin, hamster proteins, latex, other medicines, foods, dyes, or preservatives  pregnant or trying to get pregnant  breast-feeding How should I use this medicine? This medicine is for injection into a vein or under the skin. It is usually given by a health care professional in a hospital or clinic setting. If you get this medicine at home, you will be taught how to prepare and give this medicine. Use exactly as directed. Take your medicine at regular intervals. Do not take your medicine more often than directed. It is important that you put your used needles and syringes in a special sharps container. Do not put them in a trash can. If you do not have a sharps container, call your pharmacist or healthcare provider to get one. A special MedGuide will be given to you by the pharmacist with each prescription and refill. Be sure to read this information carefully each time. Talk to your pediatrician regarding the use of this medicine in children. While this medicine may be used in children as young as 1 month of age for selected conditions, precautions do  apply. Overdosage: If you think you have taken too much of this medicine contact a poison control center or emergency room at once. NOTE: This medicine is only for you. Do not share this medicine with others. What if I miss a dose? If you miss a dose, take it as soon as you can. If it is almost time for your next dose, take only that dose. Do not take double or extra doses. What may interact with this medicine? Do not take this medicine with any of the following medications:  epoetin alfa This list may not describe all possible interactions. Give your health care provider a list of all the medicines, herbs, non-prescription drugs, or dietary supplements you use. Also tell them if you smoke, drink alcohol, or use illegal drugs. Some items may interact with your medicine. What should I watch for while using this medicine? Your condition will be monitored carefully while you are receiving this medicine. You may need blood work done while you are taking this medicine. This medicine may cause a decrease in vitamin B6. You should make sure that you get enough vitamin B6 while you are taking this medicine. Discuss the foods you eat and the vitamins you take with your health care professional. What side effects may I notice from receiving this medicine? Side effects that you should report to your doctor or health care professional as soon as possible:  allergic reactions like skin rash, itching or hives, swelling of the face, lips, or tongue  breathing problems  changes in   vision  chest pain  confusion, trouble speaking or understanding  feeling faint or lightheaded, falls  high blood pressure  muscle aches or pains  pain, swelling, warmth in the leg  rapid weight gain  severe headaches  sudden numbness or weakness of the face, arm or leg  trouble walking, dizziness, loss of balance or coordination  seizures (convulsions)  swelling of the ankles, feet, hands  unusually weak or  tired Side effects that usually do not require medical attention (report to your doctor or health care professional if they continue or are bothersome):  diarrhea  fever, chills (flu-like symptoms)  headaches  nausea, vomiting  redness, stinging, or swelling at site where injected This list may not describe all possible side effects. Call your doctor for medical advice about side effects. You may report side effects to FDA at 1-800-FDA-1088. Where should I keep my medicine? Keep out of the reach of children. Store in a refrigerator between 2 and 8 degrees C (36 and 46 degrees F). Do not freeze. Do not shake. Throw away any unused portion if using a single-dose vial. Throw away any unused medicine after the expiration date. NOTE: This sheet is a summary. It may not cover all possible information. If you have questions about this medicine, talk to your doctor, pharmacist, or health care provider.  2020 Elsevier/Gold Standard (2017-06-26 16:44:20)  

## 2019-11-30 ENCOUNTER — Encounter: Payer: Self-pay | Admitting: Family Medicine

## 2019-11-30 DIAGNOSIS — G4700 Insomnia, unspecified: Secondary | ICD-10-CM

## 2019-11-30 DIAGNOSIS — G2581 Restless legs syndrome: Secondary | ICD-10-CM

## 2019-11-30 DIAGNOSIS — G894 Chronic pain syndrome: Secondary | ICD-10-CM

## 2019-11-30 MED ORDER — ALPRAZOLAM 0.5 MG PO TABS: ORAL_TABLET | ORAL | 3 refills | Status: DC

## 2019-11-30 MED ORDER — HYDROCODONE-ACETAMINOPHEN 7.5-325 MG PO TABS: 1.0000 | ORAL_TABLET | Freq: Four times a day (QID) | ORAL | 0 refills | Status: DC | PRN

## 2019-12-02 ENCOUNTER — Other Ambulatory Visit: Payer: Self-pay

## 2019-12-02 ENCOUNTER — Encounter: Payer: Self-pay | Admitting: Family Medicine

## 2019-12-02 ENCOUNTER — Ambulatory Visit (INDEPENDENT_AMBULATORY_CARE_PROVIDER_SITE_OTHER): Payer: Medicare Other | Admitting: Family Medicine

## 2019-12-02 ENCOUNTER — Telehealth: Payer: Self-pay | Admitting: Cardiology

## 2019-12-02 ENCOUNTER — Ambulatory Visit (HOSPITAL_BASED_OUTPATIENT_CLINIC_OR_DEPARTMENT_OTHER)
Admission: RE | Admit: 2019-12-02 | Discharge: 2019-12-02 | Disposition: A | Discharge status: Home / Self Care | Payer: Medicare Other | Source: Ambulatory Visit | Attending: Family Medicine | Admitting: Family Medicine

## 2019-12-02 VITALS — BP 122/72 | HR 90 | Temp 97.8°F | Resp 20 | Ht 69.0 in | Wt 203.0 lb

## 2019-12-02 DIAGNOSIS — C8330 Diffuse large B-cell lymphoma, unspecified site: Secondary | ICD-10-CM

## 2019-12-02 DIAGNOSIS — R0602 Shortness of breath: Secondary | ICD-10-CM | POA: Diagnosis not present

## 2019-12-02 DIAGNOSIS — R609 Edema, unspecified: Secondary | ICD-10-CM

## 2019-12-02 LAB — CBC
HCT: 25.6 % — ABNORMAL LOW (ref 39.0–52.0)
Hemoglobin: 8.5 g/dL — ABNORMAL LOW (ref 13.0–17.0)
MCHC: 33.3 g/dL (ref 30.0–36.0)
MCV: 116.9 fl — ABNORMAL HIGH (ref 78.0–100.0)
Platelets: 101 10*3/uL — ABNORMAL LOW (ref 150.0–400.0)
RBC: 2.22 Mil/uL — ABNORMAL LOW (ref 4.22–5.81)
RDW: 18.4 % — ABNORMAL HIGH (ref 11.5–15.5)
WBC: 6.5 10*3/uL (ref 4.0–10.5)

## 2019-12-02 LAB — BASIC METABOLIC PANEL
BUN: 20 mg/dL (ref 6–23)
CO2: 28 mEq/L (ref 19–32)
Calcium: 9.1 mg/dL (ref 8.4–10.5)
Chloride: 103 mEq/L (ref 96–112)
Creatinine, Ser: 1.7 mg/dL — ABNORMAL HIGH (ref 0.40–1.50)
GFR: 38.05 mL/min — ABNORMAL LOW (ref >60.00)
Glucose, Bld: 109 mg/dL — ABNORMAL HIGH (ref 70–99)
Potassium: 3.7 mEq/L (ref 3.5–5.1)
Sodium: 140 mEq/L (ref 135–145)

## 2019-12-02 LAB — BRAIN NATRIURETIC PEPTIDE: Pro B Natriuretic peptide (BNP): 1021 pg/mL — ABNORMAL HIGH (ref 0.0–100.0)

## 2019-12-02 LAB — D-DIMER, QUANTITATIVE: D-Dimer, Quant: 1.37 mcg/mL FEU — ABNORMAL HIGH (ref <0.50)

## 2019-12-02 NOTE — Telephone Encounter (Signed)
Dr. Gardiner Rhyme spoke to Dr. Edilia Bo.  Patient needs OV in 1 week, will arrange.

## 2019-12-02 NOTE — Patient Instructions (Signed)
It was good to see you today- I will be in touch with your labs and chest film later today We will discuss how to use your fluid pill at that time as well   Happy 89th Birthday!

## 2019-12-02 NOTE — Progress Notes (Addendum)
[facility] [place], [place], [place] [address] [phone]  Date:  12/02/2019   Name:  Jeremiah Walker   DOB:  February 06, 1928   MRN:  [id number]  PCP:  [name], MD    Chief Complaint: Low Oxgen Level (shortness of breath, low oxygen at home, 83%)   History of Present Illness:  Jeremiah Walker is a 86 y.o. very pleasant male patient who presents with the following:  Mentally sharp elderly gentleman with history of spinal stenosis causing neurogenic claudication and chronic pain, peripheral neuropathy, chronic pain disease, CML, CHF, aortic stenosis He is celebrating his 89th birthday this weekend and wanted to make sure he is ok  Last seen by myself in April- he was admitted with CHF for a couple of days in April of this year He notes that he did not sleep well last night - this is not all that unusual for him if he does not take his alprazolam He had been out of xanax for couple of days but will pick up his refill today  He notes that taking a deep breath is harder the last few days His oxygen was 83% after walking this am- will come up with deep breaths  He sees Dr Laurence Spates for lower back injections for pain Sarah/ Dr Marin Olp treat his CMP, he gets Aranesp injections on a regular basis to keep his Hg up  covid series: complete, he is not sure of dates but will get back with me  We discussed possible symptoms of CHF exacerbation.  He has noted it may feel harder to get a deep breath, his oxygen saturation is dropping below his baseline, and he feels like his midsection is larger than usual. His legs do not generally swell- he will get swelling more in his abdomen and trunk He is using lasix prn- he took a dose this am with potassium as he knows his weight was up yesterday. No cough or fever No CP or pressure   Wt Readings from Last 3 Encounters:  12/02/19 203 lb (92.1 kg)  10/30/19 206 lb (93.4 kg)   10/20/19 199 lb 14.4 oz (90.7 kg)   Echo 4/21 1. Left ventricular ejection fraction, by estimation, is 55 to 60%. The  left ventricle has normal function. The left ventricle has no regional  wall motion abnormalities. There is mild left ventricular hypertrophy.  Left ventricular diastolic parameters  are consistent with Grade I diastolic dysfunction (impaired relaxation).  Elevated left ventricular end-diastolic pressure.  2. Right ventricular systolic function is normal. The right ventricular  size is normal.  3. Left atrial size was severely dilated.  4. The mitral valve is abnormal. Trivial mitral valve regurgitation. The  mean mitral valve gradient is 3.0 mmHg.  5. The aortic valve is tricuspid. Aortic valve regurgitation is trivial.  Moderate aortic valve stenosis. Aortic valve area, by VTI measures 1.03  cm. Aortic valve mean gradient measures 17.6 mmHg. Aortic valve Vmax  measures 2.83 m/s.  6. The inferior vena cava is normal in size with greater than 50%  respiratory variability, suggesting right atrial pressure of 3 mmHg.    11/04/2019  1   11/03/2019  Hydrocodone-Acetamin 7.5-325  100.00  25 Je Cop   [id number]   [facility] ([id number])   0  30.00 MME  Medicare   [place]  10/22/2019  1   07/21/2019  Alprazolam 0.5 MG Tablet  90.00  [place]   [id number]   [facility] [id number])   3  3.00 LME  Medicare   [place]  10/07/2019  2   10/05/2019  Hydrocodone-Acetamin 7.5-325  100.00  25 Je Cop   [id number]   [facility] ([id number])   0  30.00 MME  Medicare   [place]  09/20/2019  1   07/21/2019  Alprazolam 0.5 MG Tablet  90.00  30 Je Cop   [id number]   [facility] ([id number])   2  3.00 LME  Medicare   [place]  09/10/2019  1   08/10/2019  Pregabalin 25 MG Capsule  90.00  30 Je Cop   [id number]   [facility] ([id number])   1  0.50 LME  Medicare   [place]  09/04/2019  1   09/04/2019  Hydrocodone-Acetamin 7.5-325  100.00  25 Je Cop   [id number]   [facility] ([id number])   0  30.00 MME  Medicare   [place]  08/21/2019  1   07/21/2019  Alprazolam 0.5 MG Tablet  90.00  30 Je Cop   [id number]   [facility] ([id number])   1  3.00  LME        Patient Active Problem List   Diagnosis Date Noted  . Acute exacerbation of CHF (congestive heart failure) ([place]) 09/25/2019  . Elevated troponin 09/25/2019  . Spondylosis without myelopathy or radiculopathy, lumbar region 08/12/2019  . Spinal stenosis of lumbar region with neurogenic claudication 08/12/2019  . Myofascial pain syndrome 08/12/2019  . Chest pain 03/26/2019  . 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 Chronic pain syndrome 09/26/2015  . De Quervain's tenosynovitis, right 09/13/2015  . Anemia due to antineoplastic chemotherapy 08/15/2015  . DLBCL (diffuse large B cell lymphoma) ([place]) 08/15/2015  . Skin lesion of face 05/21/2015  . Peripheral neuropathy due to chemotherapy ([place]) 02/09/2015  . Benign prostatic hyperplasia with urinary obstruction 02/09/2015  . Bilateral hearing loss 02/09/2015  . Low back pain 02/09/2015  . CKD (chronic kidney disease), stage III 02/09/2015  . Essential (primary) hypertension 02/09/2015  . Gastro-esophageal reflux disease without esophagitis 02/09/2015  . Anxiety, generalized 02/09/2015  . Cannot sleep 02/09/2015  . Combined fat and carbohydrate induced hyperlipemia 02/09/2015    Past Medical History:  Diagnosis Date  . Acute exacerbation of CHF (congestive heart failure) ([place]) 09/25/2019  . Anemia   . Anemia due to antineoplastic chemotherapy 08/15/2015  . Anxiety   . Arthritis   . Cataract   . Chronic kidney disease (CKD), stage III (moderate) 02/09/2015   Controlled  . CMML (chronic myelomonocytic leukemia) ([place])    gets Aranesp about monthly, otherwise surveillance with Dr. Marin Olp  . Combined fat and carbohydrate induced hyperlipemia 02/09/2015   Controlled  . Depression   . DLBCL (diffuse large B cell lymphoma) ([place]) 08/15/2015  . Essential (primary) hypertension 02/09/2015  . Gastro-esophageal reflux disease without esophagitis 02/09/2015   Controlled  . Renal insufficiency    . Tinnitus     Past Surgical History:  Procedure Laterality Date  . CATARACT EXTRACTION, BILATERAL    . COLONOSCOPY  May 2011  . CYSTOSCOPY    . TOTAL HIP ARTHROPLASTY  2003   Right   . TOTAL HIP ARTHROPLASTY  April, 2011   Left  . TRIGGER FINGER RELEASE  Nov 2013  . TURBINECTOMY  2006  . UPPER GI ENDOSCOPY  Nov 2015    Social History   Tobacco Use  . Smoking status: Former Research scientist (life sciences)  . Smokeless tobacco: Never Used  .  Tobacco comment: Quit >50 years ago  Substance Use Topics  . Alcohol use: Yes    Alcohol/week: 1.0 - 4.0 standard drinks    Types: 1 - 4 Shots of liquor per week    Comment: 4 glasses per week  . Drug use: No    Family History  Problem Relation Age of Onset  . Anuerysm Mother 50       Deceased  . Colon cancer Father 85       Deceased  . Colon cancer Brother   . Prostate cancer Brother        Deceased  . Alzheimer's disease Brother   . Heart disease Brother   . Alzheimer's disease Paternal Grandmother   . Early death Maternal Grandmother        Unknown  . Obesity Son   . Obesity Son        Gastric Bypass  . Healthy Son   . Healthy Daughter   . Diabetes Neg Hx     Allergies  Allergen Reactions  . Nitroglycerin Other (See Comments)    "I ended up in a fetal position from the pain"  . Nsaids Other (See Comments) and Tinitus    Bruising   . Statins Nausea And Vomiting and Other (See Comments)    Kidney issues and muscle pain   . Tolmetin Other (See Comments)    Bruising  . Ciprofloxacin Other (See Comments)    Achilles tendon issues  . Ropinirole Anxiety and Other (See Comments)    Mood swings, also  . Requip [Ropinirole Hcl] Anxiety    Medication list has been reviewed and updated.  Current Outpatient Medications on File Prior to Visit  Medication Sig Dispense Refill  . allopurinol (ZYLOPRIM) 100 MG tablet TAKE 2 TABLETS(200 MG) BY MOUTH DAILY (Patient taking differently: Take 200 mg by mouth daily with breakfast. ) 180 tablet 3   . ALPRAZolam (XANAX) 0.5 MG tablet TAKE 1 TABLET(0.5 MG) BY MOUTH THREE TIMES DAILY AS NEEDED FOR ANXIETY 90 tablet 3  . amoxicillin (AMOXIL) 500 MG capsule Take 4 caps (2gm) by mouth once prior to dental procedure 20 capsule 2  . azelastine (ASTELIN) 0.1 % nasal spray Place 2 sprays into both nostrils 2 (two) times daily. Use in each nostril as directed (Patient taking differently: Place 2 sprays into both nostrils 2 (two) times daily as needed (for seasonal allergies). ) 30 mL 11  . b complex vitamins tablet Take 1 tablet by mouth daily.    . baclofen (LIORESAL) 10 MG tablet Take 1/2 to 1 by mouth every 8hrs as needed for spasm 60 tablet 0  . bethanechol (URECHOLINE) 25 MG tablet TAKE 1 TABLET(25 MG) BY MOUTH THREE TIMES DAILY (Patient taking differently: Take 25 mg by mouth 3 (three) times daily. ) 90 tablet 11  . Calcium 500 MG tablet Take 500 mg by mouth daily.     . Cholecalciferol (VITAMIN D3) 2000 UNITS capsule Take 2,000 Units by mouth daily.    . Cyanocobalamin (VITAMIN B-12 PO) Take 1 tablet by mouth daily after breakfast.    . folic acid (FOLVITE) 1 MG tablet TAKE 2 TABLETS(2 MG) BY MOUTH DAILY 60 tablet 4  . gabapentin (NEURONTIN) 300 MG capsule TAKE 3 CAPSULES BY MOUTH THREE TIMES DAILY, PLUS 2 CAPSULES EVERY DAY AS NEEDED 90 capsule 0  . HYDROcodone-acetaminophen (NORCO) 7.5-325 MG tablet Take 1 tablet by mouth every 6 (six) hours as needed for moderate pain. 100 tablet 0  . Magnesium  Oxide 200 MG TABS Take 200 mg by mouth daily.     . Melatonin 10 MG TABS Take 20 mg by mouth at bedtime.     . Multiple Vitamin (MULTIVITAMIN) tablet Take 1 tablet by mouth daily.    . Omega-3 Fatty Acids (OMEGA-3 FISH OIL PO) Take 1 capsule by mouth daily. EPA/DHA 520/350     . Polyethyl Glyc-Propyl Glyc PF (SYSTANE ULTRA PF) 0.4-0.3 % SOLN Place 1-2 drops into both eyes 3 (three) times daily as needed (for dryness).     . polyethylene glycol (MIRALAX / GLYCOLAX) packet Take 17 g by mouth daily as  needed (into 6-8 ounces of water and drink (in conjunction with Metamucil) once a day only when taking Norco).     . Potassium Chloride ER 20 MEQ TBCR Take 1 daily as needed with furosemide dose 30 tablet 1  . predniSONE (DELTASONE) 20 MG tablet May take 1 tablet daily for up to 3 days as needed for gout attack or muscle spasm (Patient taking differently: Take 20 mg by mouth See admin instructions. Take 20 mg by mouth once a day for up to three days, as directed/as needed for gout attacks or muscle spasms/pain flares) 20 tablet 1  . Psyllium (METAMUCIL PO) Take by mouth See admin instructions. Mix 1 teaspoonful into 6-8 ounces of water and drink (in conjunction with Miralax) once a day only when taking Norco    . Pyridoxine HCl (VITAMIN B-6 PO) Take 1 tablet by mouth daily after breakfast.    . vitamin C (ASCORBIC ACID) 500 MG tablet Take 1,000 mg by mouth daily.     Marland Kitchen zinc gluconate 50 MG tablet Take 50 mg by mouth daily.    . furosemide (LASIX) 40 MG tablet Take 1 tablet (40 mg total) by mouth daily as needed for fluid or edema. 30 tablet 0   No current facility-administered medications on file prior to visit.    Review of Systems:  As per HPI- otherwise negative.   Physical Examination: Vitals:   12/02/19 1025  BP: 122/72  Pulse: (!) 105  Resp: 20  Temp: 97.8 F (36.6 C)  SpO2: 90%   Vitals:   12/02/19 1025  Weight: 203 lb (92.1 kg)  Height: 5\' 9"  (1.753 m)   Body mass index is 29.98 kg/m. Ideal Body Weight: Weight in (lb) to have BMI = 25: 168.9  My CMA noted oxygen saturation to 83% after walking back to my exam room, increased to 95% after resting and taking deep breaths GEN: no acute distress.  Central obesity, looks well and his normal self HEENT: Atraumatic, Normocephalic.  Ears and Nose: No external deformity. CV: RRR, No G/R. No JVD. No thrill. No extra heart sounds.  Systolic murmur which is stable/consistent with aortic stenosis  PULM: CTA B, no wheezes,  crackles, rhonchi. No retractions. No resp. distress. No accessory muscle use. ABD: S, NT, ND, +BS. No rebound. No HSM. EXTR: No c/c/e PSYCH: Normally interactive. Conversant.   EKG:  SR with non- specific ST changes.  Rate 89 Compared with tracing from 09/2019, no significant changes   BP Readings from Last 3 Encounters:  12/02/19 122/72  11/20/19 (!) 94/44  11/02/19 126/63    Assessment and Plan: SOB (shortness of breath) - Plan: DG Chest 2 View, Basic metabolic panel, B Nat Peptide, EKG 12-Lead, D-Dimer, Quantitative  Edema, unspecified type  Diffuse large B-cell lymphoma, unspecified body region [facility]) - Plan: CBC  Patient with history of CHF, here  today with increasing shortness of breath and some oxygen desaturation with walking Suspect CHF exacerbation may be culprit EKG is nonspecific, no chest pain noted Also discussed possibility of pulmonary embolism, offered to do a D-dimer.  Patient would like to do this test He does have chronic anemia which likely contributes to shortness of breath  Signed Lamar Blinks, MD  Received labs, consistent with CHF exacerbation I was able to speak with Dr Alda Lea after lab results came in.  We will have Hal use Lasix 40 daily for the next several days, along with potassium.  Dr. Alda Lea kindly offered to arrange follow-up in his office next week  Called patient to discuss his lab findings.  Went over recommendation to take Lasix 40 daily and K  Patient will continue to closely monitor his weight and breathing status Anemia is stable, platelets are slightly improved Discussed D-dimer; elevated, but reduced from April 2 of this year; at that time he had a CT angiogram which was negative for pulmonary embolism His elevated D-dimer at this time may be related to heart failure, although a PE is also possible.  We discussed doing a CT angiogram.  At this time, the patient has elected to defer CT angiogram, he will carefully monitor his  symptoms and we can proceed to a CT angiogram if he notes any worsening  Results for orders placed or performed in visit on 43/15/40  Basic metabolic panel  Result Value Ref Range   Sodium 140 135 - 145 mEq/L   Potassium 3.7 3.5 - 5.1 mEq/L   Chloride 103 96 - 112 mEq/L   CO2 28 19 - 32 mEq/L   Glucose, Bld 109 (H) 70 - 99 mg/dL   BUN 20 6 - 23 mg/dL   Creatinine, Ser 1.70 (H) 0.40 - 1.50 mg/dL   GFR 38.05 (L) >60.00 mL/min   Calcium 9.1 8.4 - 10.5 mg/dL  B Nat Peptide  Result Value Ref Range   Pro B Natriuretic peptide (BNP) 1,021.0 (H) 0.0 - 100.0 pg/mL  CBC  Result Value Ref Range   WBC 6.5 4.0 - 10.5 K/uL   RBC 2.22 (L) 4.22 - 5.81 Mil/uL   Platelets 101.0 (L) 150.0 - 400.0 K/uL   Hemoglobin 8.5 Repeated and verified X2. (L) 13.0 - 17.0 g/dL   HCT 25.6 (L) 39.0 - 52.0 %   MCV 116.9 Repeated and verified X2. (H) 78.0 - 100.0 fl   MCHC 33.3 30.0 - 36.0 g/dL   RDW 18.4 (H) 11.5 - 15.5 %  D-Dimer, Quantitative  Result Value Ref Range   D-Dimer, Quant 1.37 (H) <0.50 mcg/mL FEU   DG Chest 2 View  Result Date: 12/02/2019 CLINICAL DATA:  Shortness of breath EXAM: CHEST - 2 VIEW COMPARISON:  September 25, 2019 FINDINGS: There is mild atelectatic change in the bases. There is no edema or airspace opacity. Heart is borderline enlarged with pulmonary vascularity normal. No adenopathy. There is aortic atherosclerosis. There is evidence of old trauma with remodeling in the left clavicle. IMPRESSION: Bibasilar atelectasis. No edema or airspace opacity. Mild cardiac prominence. No adenopathy. Aortic Atherosclerosis (ICD10-I70.0). Electronically Signed   By: Lowella Grip III M.D.   On: 12/02/2019 12:31    Called 6/10 to check on pt- he is feeling better, breathing is somewhat better and he is down 7lbs.  He is seeing cardiology next week  We discussed doing a VQ scan for him as a way to risk stratify for PE without exposing his kidneys to  contrast.  He would like to do this.  Will order for  him now

## 2019-12-03 ENCOUNTER — Telehealth: Payer: Self-pay | Admitting: Cardiology

## 2019-12-03 ENCOUNTER — Telehealth: Payer: Self-pay

## 2019-12-03 ENCOUNTER — Telehealth: Payer: Self-pay | Admitting: Physical Medicine and Rehabilitation

## 2019-12-03 DIAGNOSIS — R7989 Other specified abnormal findings of blood chemistry: Secondary | ICD-10-CM

## 2019-12-03 DIAGNOSIS — R0602 Shortness of breath: Secondary | ICD-10-CM

## 2019-12-03 NOTE — Addendum Note (Signed)
Addended by: Wynonia Musty A on: 12/03/2019 03:56 PM   Modules accepted: Orders

## 2019-12-03 NOTE — Telephone Encounter (Signed)
-----   Message from [name], MD sent at 12/03/2019 12:23 PM EDT ----- Elease Etienne - I just ordered a VQ scan for Hal.  This is an alternative scan for PE that will be safer for his kidneys than a CT scan.  I [facility] but certainly ok to change location if needed.  The pt can go today or tomorrow.  Can you please let the appropriate person know about this stat study referral?  I am not sure who to contact to set it up.  Thank you so much!!

## 2019-12-03 NOTE — Telephone Encounter (Signed)
Ok x 1

## 2019-12-03 NOTE — Telephone Encounter (Signed)
I attempted to contact patient 12/03/19 to schedule follow up visit from patients recall list with Dr.Schumann. The patient didn't answer so I left voicemail for patient to return call to get that appointment scheduled.

## 2019-12-03 NOTE — Telephone Encounter (Signed)
Patient called.   Wanting a call back to schedule an appointment for his back to be seen   Call back: [phone]

## 2019-12-03 NOTE — Telephone Encounter (Signed)
Pt states last injection 11/02/19 helped out a lot and gave him a 90% of relief.

## 2019-12-03 NOTE — Telephone Encounter (Addendum)
Spoke with patient-patient is aware of xray and scan tomorrow morning at [facility]. Patient is to be there at 8:30. Patient verbalized understanding. Dr. Lorelei Pont, I went ahead and ordered chest xray-they are requiring prior to scan.

## 2019-12-03 NOTE — Telephone Encounter (Signed)
Pt had left L2-3 TF 11/02/19 and would like to get another injection. If no new injuries/traumas ok to repeat?

## 2019-12-03 NOTE — Telephone Encounter (Signed)
Pt is scheduled for 01/06/20 with driver.

## 2019-12-03 NOTE — Addendum Note (Signed)
Addended by: Lamar Blinks C on: 12/03/2019 12:25 PM   Modules accepted: Orders

## 2019-12-04 ENCOUNTER — Ambulatory Visit (HOSPITAL_COMMUNITY)
Admission: RE | Admit: 2019-12-04 | Discharge: 2019-12-04 | Disposition: A | Discharge status: Home / Self Care | Payer: Medicare Other | Source: Ambulatory Visit | Attending: Family Medicine | Admitting: Family Medicine

## 2019-12-04 ENCOUNTER — Other Ambulatory Visit: Payer: Self-pay | Admitting: Family Medicine

## 2019-12-04 DIAGNOSIS — R0602 Shortness of breath: Secondary | ICD-10-CM

## 2019-12-04 DIAGNOSIS — R7989 Other specified abnormal findings of blood chemistry: Secondary | ICD-10-CM | POA: Diagnosis not present

## 2019-12-04 DIAGNOSIS — R0609 Other forms of dyspnea: Secondary | ICD-10-CM | POA: Diagnosis not present

## 2019-12-04 MED ORDER — TECHNETIUM TO 99M ALBUMIN AGGREGATED
1.5300 | Freq: Once | INTRAVENOUS | Status: AC | PRN
  Administered 2019-12-04: 1.53 via INTRAVENOUS

## 2019-12-06 NOTE — Progress Notes (Signed)
Cardiology Office Note:    Date:  12/08/2019   ID:  Jeremiah Walker, DOB 1927-09-26, MRN [id number]  PCP:  [name], MD  Cardiologist:  Donato Heinz, MD  Electrophysiologist:  None   Referring MD: [name], MD   Chief Complaint  Patient presents with  . Congestive Heart Failure    History of Present Illness:    Jeremiah Walker is a 86 y.o. male with a hx of CML, CKD stage III, hypertension who presents for follow-up.  He was admitted to [facility] for chest pain on 03/26/2019.  He reported that he had been doing water aerobics and while he had no chest pain while exercising, that evening he was doing stretches on the floor and began having substernal chest pain.  Pain lasted from 9 PM that night until 7 AM the following morning.  EKG showed no acute ST/T abnormalities.  Labs showed mild elevation in high-sensitivity troponin (56->66).  He had a CTPA which showed no evidence of PE but did note coronary artery atherosclerosis.  Nuclear stress test was done, which showed no ischemia but did show a large fixed defect suggestive of prior infarct.  TTE was done which showed normal ejection fraction, suggesting perfusion defect on stress test was likely artifact.  TTE was also notable for moderate aortic stenosis.    He was admitted to [facility] from 4/2 through 09/27/2019 with progressive dyspnea.  CTA chest showed moderate bilateral pleural effusions.  BNP 722 on 4/2.  TTE showed LVEF 55 to 22%, grade 1 diastolic dysfunction, normal RV systolic function, moderate aortic stenosis.  He was admitted for CHF exacerbation and diuresed with IV Lasix, with significant provement in his symptoms.  He was discharged on as needed Lasix.    Since last clinic visit, he was seen last week by Dr. Lorelei Pont last week and noted worsening dyspnea.  Also had SPO2 down to 83% after walking.  Labs checked and proBNP elevated to 1000.  He had been taking Lasix as needed, since that time has been taking  Lasix daily. States weight is down 4lbs since starting lasix.  Reports that he feels improved. Reports that breathing feels about back to normal.  Denies chest pain, lightheadedness, LE edema,  or syncope.     Past Medical History:  Diagnosis Date  . Acute exacerbation of CHF (congestive heart failure) ([place]) 09/25/2019  . Anemia   . Anemia due to antineoplastic chemotherapy 08/15/2015  . Anxiety   . Arthritis   . Cataract   . Chronic kidney disease (CKD), stage III (moderate) 02/09/2015   Controlled  . CMML (chronic myelomonocytic leukemia) ([place])    gets Aranesp about monthly, otherwise surveillance with Dr. Marin Olp  . Combined fat and carbohydrate induced hyperlipemia 02/09/2015   Controlled  . Depression   . DLBCL (diffuse large B cell lymphoma) ([place]) 08/15/2015  . Essential (primary) hypertension 02/09/2015  . Gastro-esophageal reflux disease without esophagitis 02/09/2015   Controlled  . Renal insufficiency   . Tinnitus     Past Surgical History:  Procedure Laterality Date  . CATARACT EXTRACTION, BILATERAL    . COLONOSCOPY  May 2011  . CYSTOSCOPY    . TOTAL HIP ARTHROPLASTY  2003   Right   . TOTAL HIP ARTHROPLASTY  April, 2011   Left  . TRIGGER FINGER RELEASE  Nov 2013  . TURBINECTOMY  2006  . UPPER GI ENDOSCOPY  Nov 2015    Current Medications: Current Meds  Medication Sig  .  allopurinol (ZYLOPRIM) 100 MG tablet TAKE 2 TABLETS(200 MG) BY MOUTH DAILY (Patient taking differently: Take 200 mg by mouth daily with breakfast. )  . ALPRAZolam (XANAX) 0.5 MG tablet TAKE 1 TABLET(0.5 MG) BY MOUTH THREE TIMES DAILY AS NEEDED FOR ANXIETY  . amoxicillin (AMOXIL) 500 MG capsule Take 4 caps (2gm) by mouth once prior to dental procedure  . azelastine (ASTELIN) 0.1 % nasal spray Place 2 sprays into both nostrils 2 (two) times daily. Use in each nostril as directed (Patient taking differently: Place 2 sprays into both nostrils 2 (two) times daily as needed (for seasonal allergies). )  .  b complex vitamins tablet Take 1 tablet by mouth daily.  . baclofen (LIORESAL) 10 MG tablet Take 1/2 to 1 by mouth every 8hrs as needed for spasm  . bethanechol (URECHOLINE) 25 MG tablet TAKE 1 TABLET(25 MG) BY MOUTH THREE TIMES DAILY (Patient taking differently: Take 25 mg by mouth 3 (three) times daily. )  . Calcium 500 MG tablet Take 500 mg by mouth daily.   . Cholecalciferol (VITAMIN D3) 2000 UNITS capsule Take 2,000 Units by mouth daily.  . Cyanocobalamin (VITAMIN B-12 PO) Take 1 tablet by mouth daily after breakfast.  . folic acid (FOLVITE) 1 MG tablet TAKE 2 TABLETS(2 MG) BY MOUTH DAILY  . gabapentin (NEURONTIN) 300 MG capsule TAKE 3 CAPSULES BY MOUTH THREE TIMES DAILY, PLUS 2 CAPSULES EVERY DAY AS NEEDED  . HYDROcodone-acetaminophen (NORCO) 7.5-325 MG tablet Take 1 tablet by mouth every 6 (six) hours as needed for moderate pain.  . Magnesium Oxide 200 MG TABS Take 200 mg by mouth daily.   . Melatonin 10 MG TABS Take 20 mg by mouth at bedtime.   . Multiple Vitamin (MULTIVITAMIN) tablet Take 1 tablet by mouth daily.  . Omega-3 Fatty Acids (OMEGA-3 FISH OIL PO) Take 1 capsule by mouth daily. EPA/DHA 520/350   . Polyethyl Glyc-Propyl Glyc PF (SYSTANE ULTRA PF) 0.4-0.3 % SOLN Place 1-2 drops into both eyes 3 (three) times daily as needed (for dryness).   . polyethylene glycol (MIRALAX / GLYCOLAX) packet Take 17 g by mouth daily as needed (into 6-8 ounces of water and drink (in conjunction with Metamucil) once a day only when taking Norco).   . Potassium Chloride ER 20 MEQ TBCR Take 20 mEq by mouth daily.  . predniSONE (DELTASONE) 20 MG tablet May take 1 tablet daily for up to 3 days as needed for gout attack or muscle spasm (Patient taking differently: Take 20 mg by mouth See admin instructions. Take 20 mg by mouth once a day for up to three days, as directed/as needed for gout attacks or muscle spasms/pain flares)  . Psyllium (METAMUCIL PO) Take by mouth See admin instructions. Mix 1 teaspoonful  into 6-8 ounces of water and drink (in conjunction with Miralax) once a day only when taking Norco  . Pyridoxine HCl (VITAMIN B-6 PO) Take 1 tablet by mouth daily after breakfast.  . vitamin C (ASCORBIC ACID) 500 MG tablet Take 1,000 mg by mouth daily.   Marland Kitchen zinc gluconate 50 MG tablet Take 50 mg by mouth daily.  . [DISCONTINUED] Potassium Chloride ER 20 MEQ TBCR Take 1 daily as needed with furosemide dose     Allergies:   Nitroglycerin, Nsaids, Statins, Tolmetin, Ciprofloxacin, Ropinirole, and Requip [ropinirole hcl]   Social History   Socioeconomic History  . Marital status: Widowed    Spouse name: Not on file  . Number of children: Not on file  .  Years of education: Not on file  . Highest education level: Not on file  Occupational History  . Not on file  Tobacco Use  . Smoking status: Former Research scientist (life sciences)  . Smokeless tobacco: Never Used  . Tobacco comment: Quit >50 years ago  Vaping Use  . Vaping Use: Never used  Substance and Sexual Activity  . Alcohol use: Yes    Alcohol/week: 1.0 - 4.0 standard drink    Types: 1 - 4 Shots of liquor per week    Comment: 4 glasses per week  . Drug use: No  . Sexual activity: Not on file  Other Topics Concern  . Not on file  Social History Narrative  . Not on file   Social Determinants of Health   Financial Resource Strain:   . Difficulty of Paying Living Expenses:   Food Insecurity:   . Worried About Charity fundraiser in the Last Year:   . Arboriculturist in the Last Year:   Transportation Needs:   . Film/video editor (Medical):   Marland Kitchen Lack of Transportation (Non-Medical):   Physical Activity:   . Days of Exercise per Week:   . Minutes of Exercise per Session:   Stress:   . Feeling of Stress :   Social Connections:   . Frequency of Communication with Friends and Family:   . Frequency of Social Gatherings with Friends and Family:   . Attends Religious Services:   . Active Member of Clubs or Organizations:   . Attends Theatre manager Meetings:   Marland Kitchen Marital Status:      Family History: The patient's family history includes Alzheimer's disease in his brother and paternal grandmother; Anuerysm (age of onset: 15) in his mother; Colon cancer in his brother; Colon cancer (age of onset: 40) in his father; Early death in his maternal grandmother; Healthy in his daughter and son; Heart disease in his brother; Obesity in his son and son; Prostate cancer in his brother. There is no history of Diabetes.  ROS:   Please see the history of present illness.     All other systems reviewed and are negative.  EKGs/Labs/Other Studies Reviewed:    The following studies were reviewed today:   EKG:  EKG is ordered today and shows normal sinus rhythm, rate 91, poor R wave progression  Stress MPI 03/26/19:  There was no ST segment deviation noted during stress.  No T wave inversion was noted during stress.  Defect 1: There is a large defect of moderate severity present in the basal anteroseptal, basal inferoseptal, basal inferior, basal inferolateral, basal anterolateral, mid inferoseptal, mid inferior, mid inferolateral, apical inferior and apical lateral location.  Findings consistent with prior myocardial infarction.  The left ventricular ejection fraction is mildly decreased (45-54%).  This is a high risk study.  No prior for comparison.   Large, severe fixed defect primarily in inferolateral/inferior walls and extending to adjacent walls, especially at base. Reduced EF with matching wall motion abnormalities. Consistent with infarct. No reversible ischemia seen.  TTE 03/27/19:  1. Left ventricular ejection fraction, by visual estimation, is 60 to 65%. The left ventricle has normal function. There is moderately increased left ventricular hypertrophy.  2. Elevated left atrial and left ventricular end-diastolic pressures.  3. Left ventricular diastolic Doppler parameters are consistent with impaired relaxation pattern  of LV diastolic filling.  4. Global right ventricle has normal systolic function.The right ventricular size is normal. No increase in right ventricular wall thickness.  5. Left atrial size was moderately dilated.  6. Right atrial size was normal.  7. Moderate mitral annular calcification.  8. The mitral valve is abnormal. Mild mitral valve regurgitation.  9. The tricuspid valve is grossly normal. Tricuspid valve regurgitation is trivial. 10. The aortic valve is tricuspid Aortic valve regurgitation was not visualized by color flow Doppler. Moderate aortic valve stenosis. 11. The pulmonic valve was grossly normal. Pulmonic valve regurgitation is trivial by color flow Doppler.   Recent Labs: 10/30/2019: ALT 10 12/02/2019: Hemoglobin 8.5 Repeated and verified X2.; Platelets 101.0; Pro B Natriuretic peptide (BNP) 1,021.0 12/08/2019: BNP WILL FOLLOW; BUN 25; Creatinine, Ser 1.81; Magnesium 2.1; Potassium 3.9; Sodium 139  Recent Lipid Panel    Component Value Date/Time   CHOL 141 03/26/2019 0055   TRIG 69 03/26/2019 0055   HDL 41 03/26/2019 0055   CHOLHDL 3.4 03/26/2019 0055   VLDL 14 03/26/2019 0055   LDLCALC 86 03/26/2019 0055    Physical Exam:    VS:  BP 125/70   Pulse 91   Temp (!) 97.3 F (36.3 C)   Ht 5\' 9"  (1.753 m)   Wt 200 lb (90.7 kg)   SpO2 90%   BMI 29.53 kg/m     Wt Readings from Last 3 Encounters:  12/08/19 200 lb (90.7 kg)  12/02/19 203 lb (92.1 kg)  10/30/19 206 lb (93.4 kg)     GEN:  in no acute distress HEENT: Normal NECK: + JVD LYMPHATICS: No lymphadenopathy CARDIAC: RRR, 3/6 systolic murmur loudest at RUSB RESPIRATORY: CTAB ABDOMEN: Soft, non-tender, non-distended MUSCULOSKELETAL:  No edema; No deformity  SKIN: Warm and dry NEUROLOGIC:  Alert and oriented x 3 PSYCHIATRIC:  Normal affect   ASSESSMENT:    1. Acute on chronic diastolic congestive heart failure ([place])   2. Chronic renal failure, stage 3b   3. Aortic stenosis, moderate    PLAN:      Acute on chronic diastolic heart failure: admission in April 2021 for decompensated heart failure, responded well to IV Lasix.  Discharged on as needed p.o. Lasix.  Worsening symptoms recently was started on p.o. Lasix 40 mg daily last week.  Improving since starting daily Lasix, reports weight is down 4 pounds.  Suspect he still has extra volume as elevated JVD on exam, though no lower extremity edema and lungs are clear. -Continue Lasix 40 mg daily -Check BMP, magnesium, BNP  Aortic stenosis: moderate AS, will follow with annual echo  Chest pain:atypical symptoms.  Stress MPI shows fixed defect, mild systolic dysfunction.  TTE showed normal EF, suggesting likely artifact on stress MPI  HTN:not on any agents currently. Blood pressures stable  HLD: developed rhabo while on statin therapy in the past. LDL 85 without current therapy.   HDQ:QIWL anemia and thrombocytopenia. Followed by Dr. Marin Olp through the [place] [facility]. On aranesp q3 weeks.   CKD IIIb:Cr 1.7 on 12/02/2019.  Will check BMP as above  RTC in 1 month  Medication Adjustments/Labs and Tests Ordered: Current medicines are reviewed at length with the patient today.  Concerns regarding medicines are outlined above.  Orders Placed This Encounter  Procedures  . Brain natriuretic peptide  . Basic metabolic panel  . Magnesium  . EKG 12-Lead   Meds ordered this encounter  Medications  . furosemide (LASIX) 40 MG tablet    Sig: Take 1 tablet (40 mg total) by mouth daily.    Dispense:  30 tablet    Refill:  1  . Potassium Chloride ER  20 MEQ TBCR    Sig: Take 20 mEq by mouth daily.    Dispense:  30 tablet    Refill:  1    Patient Instructions  Medication Instructions:  Continue Lasix 40mg  Daily and Potassium chloride 44meq Daily *If you need a refill on your cardiac medications before your next appointment, please call your pharmacy*   Lab Work: BMP, Magnesium Level, and BNP If you have labs (blood work) drawn  today and your tests are completely normal, you will receive your results only by: Marland Kitchen MyChart Message (if you have MyChart) OR . A paper copy in the mail If you have any lab test that is abnormal or we need to change your treatment, we will call you to review the results.   Testing/Procedures: none   Follow-Up: At [facility], you and your health needs are our priority.  As part of our continuing mission to provide you with exceptional heart care, we have created designated Provider Care Teams.  These Care Teams include your primary Cardiologist (physician) and Advanced Practice Providers (APPs -  Physician Assistants and Nurse Practitioners) who all work together to provide you with the care you need, when you need it.  Your next appointment:   1 month(s)  The format for your next appointment:   In Person  Provider:   You may see Donato Heinz, MD** or one of the following Advanced Practice Providers on your designated Care Team:        Other Instruction: check weight daily and keep log of weight.  If weight increases 3lbs. Overnight or 5lbs. In a week please call. Or symptoms worsen.    Signed, Donato Heinz, MD  12/08/2019 11:46 PM    [place]

## 2019-12-07 ENCOUNTER — Telehealth: Payer: Self-pay | Admitting: Physical Medicine and Rehabilitation

## 2019-12-07 ENCOUNTER — Ambulatory Visit: Payer: Medicare Other | Admitting: Family Medicine

## 2019-12-07 NOTE — Telephone Encounter (Signed)
Patient came in today. He would like an earlier appointment if one comes available. His call back number is [phone]

## 2019-12-08 ENCOUNTER — Other Ambulatory Visit: Payer: Self-pay

## 2019-12-08 ENCOUNTER — Ambulatory Visit (INDEPENDENT_AMBULATORY_CARE_PROVIDER_SITE_OTHER): Payer: Medicare Other | Admitting: Cardiology

## 2019-12-08 VITALS — BP 125/70 | HR 91 | Temp 97.3°F | Ht 69.0 in | Wt 200.0 lb

## 2019-12-08 DIAGNOSIS — N1832 Chronic kidney disease, stage 3b: Secondary | ICD-10-CM

## 2019-12-08 DIAGNOSIS — I5033 Acute on chronic diastolic (congestive) heart failure: Secondary | ICD-10-CM | POA: Diagnosis not present

## 2019-12-08 DIAGNOSIS — I350 Nonrheumatic aortic (valve) stenosis: Secondary | ICD-10-CM | POA: Diagnosis not present

## 2019-12-08 MED ORDER — POTASSIUM CHLORIDE ER 20 MEQ PO TBCR: 20.0000 meq | EXTENDED_RELEASE_TABLET | Freq: Every day | ORAL | 1 refills | Status: AC

## 2019-12-08 MED ORDER — FUROSEMIDE 40 MG PO TABS: 40.0000 mg | ORAL_TABLET | Freq: Every day | ORAL | 1 refills | Status: DC

## 2019-12-08 NOTE — Patient Instructions (Signed)
Medication Instructions:  Continue Lasix 40mg  Daily and Potassium chloride 35meq Daily *If you need a refill on your cardiac medications before your next appointment, please call your pharmacy*   Lab Work: BMP, Magnesium Level, and BNP If you have labs (blood work) drawn today and your tests are completely normal, you will receive your results only by: Marland Kitchen MyChart Message (if you have MyChart) OR . A paper copy in the mail If you have any lab test that is abnormal or we need to change your treatment, we will call you to review the results.   Testing/Procedures: none   Follow-Up: At [facility], you and your health needs are our priority.  As part of our continuing mission to provide you with exceptional heart care, we have created designated Provider Care Teams.  These Care Teams include your primary Cardiologist (physician) and Advanced Practice Providers (APPs -  Physician Assistants and Nurse Practitioners) who all work together to provide you with the care you need, when you need it.  Your next appointment:   1 month(s)  The format for your next appointment:   In Person  Provider:   You may see Donato Heinz, MD** or one of the following Advanced Practice Providers on your designated Care Team:        Other Instruction: check weight daily and keep log of weight.  If weight increases 3lbs. Overnight or 5lbs. In a week please call. Or symptoms worsen.

## 2019-12-08 NOTE — Telephone Encounter (Signed)
Called patient and left message #1.

## 2019-12-09 LAB — BASIC METABOLIC PANEL
BUN/Creatinine Ratio: 14 (ref 10–24)
BUN: 25 mg/dL (ref 10–36)
CO2: 22 mmol/L (ref 20–29)
Calcium: 9.1 mg/dL (ref 8.6–10.2)
Chloride: 99 mmol/L (ref 96–106)
Creatinine, Ser: 1.81 mg/dL — ABNORMAL HIGH (ref 0.76–1.27)
GFR calc Af Amer: 37 mL/min/{1.73_m2} — ABNORMAL LOW (ref >59)
GFR calc non Af Amer: 32 mL/min/{1.73_m2} — ABNORMAL LOW (ref >59)
Glucose: 88 mg/dL (ref 65–99)
Potassium: 3.9 mmol/L (ref 3.5–5.2)
Sodium: 139 mmol/L (ref 134–144)

## 2019-12-09 LAB — BRAIN NATRIURETIC PEPTIDE: BNP: 730.2 pg/mL — ABNORMAL HIGH (ref 0.0–100.0)

## 2019-12-09 LAB — MAGNESIUM: Magnesium: 2.1 mg/dL (ref 1.6–2.3)

## 2019-12-10 ENCOUNTER — Inpatient Hospital Stay: Payer: Medicare Other

## 2019-12-10 ENCOUNTER — Inpatient Hospital Stay (HOSPITAL_BASED_OUTPATIENT_CLINIC_OR_DEPARTMENT_OTHER): Payer: Medicare Other | Admitting: Family

## 2019-12-10 ENCOUNTER — Inpatient Hospital Stay: Payer: Medicare Other | Attending: Hematology & Oncology

## 2019-12-10 ENCOUNTER — Ambulatory Visit: Payer: Medicare Other | Admitting: Family

## 2019-12-10 ENCOUNTER — Other Ambulatory Visit: Payer: Self-pay

## 2019-12-10 ENCOUNTER — Encounter: Payer: Self-pay | Admitting: Family

## 2019-12-10 VITALS — BP 118/55 | HR 81 | Temp 98.5°F | Resp 18 | Ht 69.0 in | Wt 197.8 lb

## 2019-12-10 DIAGNOSIS — Z888 Allergy status to other drugs, medicaments and biological substances status: Secondary | ICD-10-CM | POA: Diagnosis not present

## 2019-12-10 DIAGNOSIS — Z8572 Personal history of non-Hodgkin lymphomas: Secondary | ICD-10-CM | POA: Insufficient documentation

## 2019-12-10 DIAGNOSIS — T451X5A Adverse effect of antineoplastic and immunosuppressive drugs, initial encounter: Secondary | ICD-10-CM | POA: Diagnosis not present

## 2019-12-10 DIAGNOSIS — Z881 Allergy status to other antibiotic agents status: Secondary | ICD-10-CM | POA: Insufficient documentation

## 2019-12-10 DIAGNOSIS — D508 Other iron deficiency anemias: Secondary | ICD-10-CM

## 2019-12-10 DIAGNOSIS — C9310 Chronic myelomonocytic leukemia not having achieved remission: Secondary | ICD-10-CM

## 2019-12-10 DIAGNOSIS — D6481 Anemia due to antineoplastic chemotherapy: Secondary | ICD-10-CM | POA: Insufficient documentation

## 2019-12-10 DIAGNOSIS — R200 Anesthesia of skin: Secondary | ICD-10-CM | POA: Diagnosis not present

## 2019-12-10 DIAGNOSIS — C8334 Diffuse large B-cell lymphoma, lymph nodes of axilla and upper limb: Secondary | ICD-10-CM

## 2019-12-10 DIAGNOSIS — Z886 Allergy status to analgesic agent status: Secondary | ICD-10-CM | POA: Insufficient documentation

## 2019-12-10 DIAGNOSIS — R202 Paresthesia of skin: Secondary | ICD-10-CM | POA: Diagnosis not present

## 2019-12-10 LAB — CMP (CANCER CENTER ONLY)
ALT: 9 U/L (ref 0–44)
AST: 14 U/L — ABNORMAL LOW (ref 15–41)
Albumin: 4.1 g/dL (ref 3.5–5.0)
Alkaline Phosphatase: 60 U/L (ref 38–126)
Anion gap: 9 (ref 5–15)
BUN: 32 mg/dL — ABNORMAL HIGH (ref 8–23)
CO2: 29 mmol/L (ref 22–32)
Calcium: 9.7 mg/dL (ref 8.9–10.3)
Chloride: 103 mmol/L (ref 98–111)
Creatinine: 2.05 mg/dL — ABNORMAL HIGH (ref 0.61–1.24)
GFR, Est AFR Am: 32 mL/min — ABNORMAL LOW (ref >60)
GFR, Estimated: 28 mL/min — ABNORMAL LOW (ref >60)
Glucose, Bld: 139 mg/dL — ABNORMAL HIGH (ref 70–99)
Potassium: 3.8 mmol/L (ref 3.5–5.1)
Sodium: 141 mmol/L (ref 135–145)
Total Bilirubin: 1.1 mg/dL (ref 0.3–1.2)
Total Protein: 7.1 g/dL (ref 6.5–8.1)

## 2019-12-10 LAB — CBC WITH DIFFERENTIAL (CANCER CENTER ONLY)
Abs Immature Granulocytes: 0.11 10*3/uL — ABNORMAL HIGH (ref 0.00–0.07)
Basophils Absolute: 0 10*3/uL (ref 0.0–0.1)
Basophils Relative: 0 %
Eosinophils Absolute: 0.1 10*3/uL (ref 0.0–0.5)
Eosinophils Relative: 1 %
HCT: 26.6 % — ABNORMAL LOW (ref 39.0–52.0)
Hemoglobin: 8.4 g/dL — ABNORMAL LOW (ref 13.0–17.0)
Immature Granulocytes: 2 %
Lymphocytes Relative: 23 %
Lymphs Abs: 1.6 10*3/uL (ref 0.7–4.0)
MCH: 37.8 pg — ABNORMAL HIGH (ref 26.0–34.0)
MCHC: 31.6 g/dL (ref 30.0–36.0)
MCV: 119.8 fL — ABNORMAL HIGH (ref 80.0–100.0)
Monocytes Absolute: 2.5 10*3/uL — ABNORMAL HIGH (ref 0.1–1.0)
Monocytes Relative: 36 %
Neutro Abs: 2.7 10*3/uL (ref 1.7–7.7)
Neutrophils Relative %: 38 %
Platelet Count: 84 10*3/uL — ABNORMAL LOW (ref 150–400)
RBC: 2.22 MIL/uL — ABNORMAL LOW (ref 4.22–5.81)
RDW: 17.6 % — ABNORMAL HIGH (ref 11.5–15.5)
WBC Count: 6.9 10*3/uL (ref 4.0–10.5)
nRBC: 0 % (ref 0.0–0.2)

## 2019-12-10 LAB — SAVE SMEAR(SSMR), FOR PROVIDER SLIDE REVIEW

## 2019-12-10 LAB — RETICULOCYTES
Immature Retic Fract: 28.7 % — ABNORMAL HIGH (ref 2.3–15.9)
RBC.: 2.24 MIL/uL — ABNORMAL LOW (ref 4.22–5.81)
Retic Count, Absolute: 57.5 10*3/uL (ref 19.0–186.0)
Retic Ct Pct: 2.6 % (ref 0.4–3.1)

## 2019-12-10 MED ORDER — DARBEPOETIN ALFA 500 MCG/ML IJ SOSY
500.0000 ug | PREFILLED_SYRINGE | Freq: Once | INTRAMUSCULAR | Status: AC
  Administered 2019-12-10: 500 ug via SUBCUTANEOUS

## 2019-12-10 NOTE — Patient Instructions (Signed)
Darbepoetin Alfa injection What is this medicine? DARBEPOETIN ALFA (dar be POE e tin AL fa) helps your body make more red blood cells. It is used to treat anemia caused by chronic kidney failure and chemotherapy. This medicine may be used for other purposes; ask your health care provider or pharmacist if you have questions. COMMON BRAND NAME(S): Aranesp What should I tell my health care provider before I take this medicine? They need to know if you have any of these conditions:  blood clotting disorders or history of blood clots  cancer patient not on chemotherapy  cystic fibrosis  heart disease, such as angina, heart failure, or a history of a heart attack  hemoglobin level of 12 g/dL or greater  high blood pressure  low levels of folate, iron, or vitamin B12  seizures  an unusual or allergic reaction to darbepoetin, erythropoietin, albumin, hamster proteins, latex, other medicines, foods, dyes, or preservatives  pregnant or trying to get pregnant  breast-feeding How should I use this medicine? This medicine is for injection into a vein or under the skin. It is usually given by a health care professional in a hospital or clinic setting. If you get this medicine at home, you will be taught how to prepare and give this medicine. Use exactly as directed. Take your medicine at regular intervals. Do not take your medicine more often than directed. It is important that you put your used needles and syringes in a special sharps container. Do not put them in a trash can. If you do not have a sharps container, call your pharmacist or healthcare provider to get one. A special MedGuide will be given to you by the pharmacist with each prescription and refill. Be sure to read this information carefully each time. Talk to your pediatrician regarding the use of this medicine in children. While this medicine may be used in children as young as 1 month of age for selected conditions, precautions do  apply. Overdosage: If you think you have taken too much of this medicine contact a poison control center or emergency room at once. NOTE: This medicine is only for you. Do not share this medicine with others. What if I miss a dose? If you miss a dose, take it as soon as you can. If it is almost time for your next dose, take only that dose. Do not take double or extra doses. What may interact with this medicine? Do not take this medicine with any of the following medications:  epoetin alfa This list may not describe all possible interactions. Give your health care provider a list of all the medicines, herbs, non-prescription drugs, or dietary supplements you use. Also tell them if you smoke, drink alcohol, or use illegal drugs. Some items may interact with your medicine. What should I watch for while using this medicine? Your condition will be monitored carefully while you are receiving this medicine. You may need blood work done while you are taking this medicine. This medicine may cause a decrease in vitamin B6. You should make sure that you get enough vitamin B6 while you are taking this medicine. Discuss the foods you eat and the vitamins you take with your health care professional. What side effects may I notice from receiving this medicine? Side effects that you should report to your doctor or health care professional as soon as possible:  allergic reactions like skin rash, itching or hives, swelling of the face, lips, or tongue  breathing problems  changes in   vision  chest pain  confusion, trouble speaking or understanding  feeling faint or lightheaded, falls  high blood pressure  muscle aches or pains  pain, swelling, warmth in the leg  rapid weight gain  severe headaches  sudden numbness or weakness of the face, arm or leg  trouble walking, dizziness, loss of balance or coordination  seizures (convulsions)  swelling of the ankles, feet, hands  unusually weak or  tired Side effects that usually do not require medical attention (report to your doctor or health care professional if they continue or are bothersome):  diarrhea  fever, chills (flu-like symptoms)  headaches  nausea, vomiting  redness, stinging, or swelling at site where injected This list may not describe all possible side effects. Call your doctor for medical advice about side effects. You may report side effects to FDA at 1-800-FDA-1088. Where should I keep my medicine? Keep out of the reach of children. Store in a refrigerator between 2 and 8 degrees C (36 and 46 degrees F). Do not freeze. Do not shake. Throw away any unused portion if using a single-dose vial. Throw away any unused medicine after the expiration date. NOTE: This sheet is a summary. It may not cover all possible information. If you have questions about this medicine, talk to your doctor, pharmacist, or health care provider.  2020 Elsevier/Gold Standard (2017-06-26 16:44:20)  

## 2019-12-10 NOTE — Progress Notes (Signed)
Hematology and Oncology Follow Up Visit  JERALD HENNINGTON [id number] 11/15/1927 86 y.o. 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 Keltner is here today for follow-up and Aranesp. He is doing well and had a wonderful 88 th birthday celebration with his family. They ate lots of German chocolate cake.  Hgb is stable at 8.4, MCV 119.  No episodes of bleeding. He does bruise easily. No petechiae.  No fever, chills, n/v, cough, rash, dizziness, SOB, chest pain, palpitations, abdominal pain or changes in bowel or bladder habits.  No swelling, tenderness at this time.  He does have numbness and tingling in his hands at times due to carpal tunnel. He wears a brace at night.  He is now on Lasix which has helped prevent fluid retention.  No falls or syncopal episodes. He ambulates with his cane for support.  He is eating well and staying properly hydrated. His weight is stable.   ECOG Performance Status: 1 - Symptomatic but completely ambulatory  Medications:  Allergies as of 12/10/2019      Reactions   Nitroglycerin Other (See Comments)   "I ended up in a fetal position from the pain"   Nsaids Other (See Comments), Tinitus   Bruising   Statins Nausea And Vomiting, Other (See Comments)   Kidney issues and muscle pain   Tolmetin Other (See Comments)   Bruising   Ciprofloxacin Other (See Comments)   Achilles tendon issues   Ropinirole Anxiety, Other (See Comments)   Mood swings, also   Requip [ropinirole Hcl] Anxiety      Medication List       Accurate as of December 10, 2019  2:33 PM. If you have any questions, ask your nurse or doctor.        allopurinol 100 MG tablet Commonly known as: ZYLOPRIM TAKE 2 TABLETS(200 MG) BY MOUTH DAILY What  changed:   how much to take  how to take this  when to take this  additional instructions   ALPRAZolam 0.5 MG tablet Commonly known as: XANAX TAKE 1 TABLET(0.5 MG) BY MOUTH THREE TIMES DAILY AS NEEDED FOR ANXIETY   amoxicillin 500 MG capsule Commonly known as: AMOXIL Take 4 caps (2gm) by mouth once prior to dental procedure   azelastine 0.1 % nasal spray Commonly known as: ASTELIN Place 2 sprays into both nostrils 2 (two) times daily. Use in each nostril as directed What changed:   when to take this  reasons to take this  additional instructions   b complex vitamins tablet Take 1 tablet by mouth daily.   baclofen 10 MG tablet Commonly known as: LIORESAL Take 1/2 to 1 by mouth every 8hrs as needed for spasm   bethanechol 25 MG tablet Commonly known as: URECHOLINE TAKE 1 TABLET(25 MG) BY MOUTH THREE TIMES DAILY What changed:   how much to take  how to take this  when to take this  additional instructions   Calcium 500 MG tablet Take 500 mg by mouth daily.   folic acid 1 MG tablet Commonly known as: FOLVITE TAKE 2 TABLETS(2 MG) BY MOUTH DAILY   furosemide 40 MG tablet Commonly known as: Lasix Take 1 tablet (40 mg total) by mouth daily.   gabapentin 300 MG capsule Commonly known as: NEURONTIN  TAKE 3 CAPSULES BY MOUTH THREE TIMES DAILY, PLUS 2 CAPSULES EVERY DAY AS NEEDED   HYDROcodone-acetaminophen 7.5-325 MG tablet Commonly known as: NORCO Take 1 tablet by mouth every 6 (six) hours as needed for moderate pain.   Magnesium Oxide 200 MG Tabs Take 200 mg by mouth daily.   Melatonin 10 MG Tabs Take 20 mg by mouth at bedtime.   METAMUCIL PO Take by mouth See admin instructions. Mix 1 teaspoonful into 6-8 ounces of water and drink (in conjunction with Miralax) once a day only when taking Norco   multivitamin tablet Take 1 tablet by mouth daily.   OMEGA-3 FISH OIL PO Take 1 capsule by mouth daily. EPA/DHA 520/350   polyethylene glycol 17 g  packet Commonly known as: MIRALAX / GLYCOLAX Take 17 g by mouth daily as needed (into 6-8 ounces of water and drink (in conjunction with Metamucil) once a day only when taking Norco).   Potassium Chloride ER 20 MEQ Tbcr Take 20 mEq by mouth daily.   predniSONE 20 MG tablet Commonly known as: DELTASONE May take 1 tablet daily for up to 3 days as needed for gout attack or muscle spasm   Systane Ultra PF 0.4-0.3 % Soln Generic drug: Polyethyl Glyc-Propyl Glyc PF Place 1-2 drops into both eyes 3 (three) times daily as needed (for dryness).   VITAMIN B-12 PO Take 1 tablet by mouth daily after breakfast.   VITAMIN B-6 PO Take 1 tablet by mouth daily after breakfast.   vitamin C 500 MG tablet Commonly known as: ASCORBIC ACID Take 1,000 mg by mouth daily.   Vitamin D3 50 MCG (2000 UT) capsule Take 2,000 Units by mouth daily.   zinc gluconate 50 MG tablet Take 50 mg by mouth daily.       Allergies:  Allergies  Allergen Reactions  . Nitroglycerin Other (See Comments)    "I ended up in a fetal position from the pain"  . Nsaids Other (See Comments) and Tinitus    Bruising   . Statins Nausea And Vomiting and Other (See Comments)    Kidney issues and muscle pain   . Tolmetin Other (See Comments)    Bruising  . Ciprofloxacin Other (See Comments)    Achilles tendon issues  . Ropinirole Anxiety and Other (See Comments)    Mood swings, also  . Requip [Ropinirole Hcl] Anxiety    Past Medical History, Surgical history, Social history, and Family History were reviewed and updated.  Review of Systems: All other 10 point review of systems is negative.   Physical Exam:  height is 5\' 9"  (1.753 m) and weight is 197 lb 12.8 oz (89.7 kg). His oral temperature is 98.5 F (36.9 C). His blood pressure is 118/55 (abnormal) and his pulse is 81. His respiration is 18 and oxygen saturation is 96%.   Wt Readings from Last 3 Encounters:  12/10/19 197 lb 12.8 oz (89.7 kg)  12/08/19 200 lb  (90.7 kg)  12/02/19 203 lb (92.1 kg)    Ocular: Sclerae unicteric, pupils equal, round and reactive to light Ear-nose-throat: Oropharynx clear, dentition fair Lymphatic: No cervical or supraclavicular adenopathy Lungs no rales or rhonchi, good excursion bilaterally Heart regular rate and rhythm, no murmur appreciated Abd soft, nontender, positive bowel sounds, no liver or spleen tip palpated on exam, no fluid wave  MSK no focal spinal tenderness, no joint edema Neuro: non-focal, well-oriented, appropriate affect Breasts: Deferred   Lab Results  Component Value Date   WBC 6.9 12/10/2019  HGB 8.4 (L) 12/10/2019   HCT 26.6 (L) 12/10/2019   MCV 119.8 (H) 12/10/2019   PLT 84 (L) 12/10/2019   Lab Results  Component Value Date   FERRITIN 341 (H) 10/30/2019   IRON 114 10/30/2019   TIBC 241 10/30/2019   UIBC 127 10/30/2019   IRONPCTSAT 47 10/30/2019   Lab Results  Component Value Date   RETICCTPCT 2.6 12/10/2019   RBC 2.24 (L) 12/10/2019   No results found for: KPAFRELGTCHN, LAMBDASER, KAPLAMBRATIO No results found for: IGGSERUM, IGA, IGMSERUM No results found for: Odetta Pink, SPEI   Chemistry      Component Value Date/Time   NA 141 12/10/2019 1352   NA 139 12/08/2019 1020   NA 139 05/08/2017 1123   NA 141 05/15/2016 1126   K 3.8 12/10/2019 1352   K 4.0 05/08/2017 1123   K 4.3 05/15/2016 1126   CL 103 12/10/2019 1352   CL 105 05/08/2017 1123   CO2 29 12/10/2019 1352   CO2 30 05/08/2017 1123   CO2 26 05/15/2016 1126   BUN 32 (H) 12/10/2019 1352   BUN 25 12/08/2019 1020   BUN 15 05/08/2017 1123   BUN 29.1 (H) 05/15/2016 1126   CREATININE 2.05 (H) 12/10/2019 1352   CREATININE 1.2 05/08/2017 1123   CREATININE 1.7 (H) 05/15/2016 1126   GLU 90 11/15/2014 0000      Component Value Date/Time   CALCIUM 9.7 12/10/2019 1352   CALCIUM 9.1 05/08/2017 1123   CALCIUM 9.7 05/15/2016 1126   ALKPHOS 60 12/10/2019 1352    ALKPHOS 64 05/08/2017 1123   ALKPHOS 69 05/15/2016 1126   AST 14 (L) 12/10/2019 1352   AST 23 05/15/2016 1126   ALT 9 12/10/2019 1352   ALT 19 05/08/2017 1123   ALT 22 05/15/2016 1126   BILITOT 1.1 12/10/2019 1352   BILITOT 0.84 05/15/2016 1126       Impression and Plan: Mr.Vincentis a 86yo caucasian gentleman withhistory of diffuse large cell non-Hodgkin's lymphomatreated with6 cycles of R-CHOP completed in April 2014.  He now has CMMoL possibly residual from the chemotherapyhe received for lymphoma. He received Aranesp today for Hgb 8.4.  We will recheck labs and do an injection in 3 weeks and follow-up in 6 weeks.  He will contact our office with any questions or concerns. We can certainly see him sooner if needed.   Laverna Peace, NP 6/17/20212:33 PM

## 2019-12-11 ENCOUNTER — Telehealth: Payer: Self-pay | Admitting: Family

## 2019-12-11 LAB — IRON AND TIBC
Iron: 64 ug/dL (ref 42–163)
Saturation Ratios: 25 % (ref 20–55)
TIBC: 258 ug/dL (ref 202–409)
UIBC: 194 ug/dL (ref 117–376)

## 2019-12-11 LAB — FERRITIN: Ferritin: 431 ng/mL — ABNORMAL HIGH (ref 24–336)

## 2019-12-11 LAB — LACTATE DEHYDROGENASE: LDH: 224 U/L — ABNORMAL HIGH (ref 98–192)

## 2019-12-11 NOTE — Telephone Encounter (Signed)
Appointments scheduled calendar printed & mailed per 6/17 los 

## 2019-12-15 ENCOUNTER — Encounter: Payer: Self-pay | Admitting: Physical Medicine and Rehabilitation

## 2019-12-15 NOTE — Progress Notes (Signed)
Jeremiah Walker - 86 y.o. male MRN [id number]  Date of birth: 02/23/1928  Office Visit Note: Visit Date: 10/20/2019 PCP: [name], MD Referred by: [name], MD  Subjective: Chief Complaint  Patient presents with  . Lower Back - Pain   HPI: Jeremiah Walker is a 86 y.o. male who comes in today For evaluation management of chronic worsening severe left-sided low back pain really in the left flank.  Patient is well-known to me and unfortunately a pretty complicated patient.  He is 86 years old he still tries to be active with some swimming and exercise.  He has pain that will be off and on and really is at times a poor historian.  By way of quick review in the past trigger point injection was almost a miracle to him a couple years ago.  More recently he has had excruciating left-sided back pain with MRI showing pretty significant disc herniation but then the last time I saw him in March he told me that he was actually doing fine and had not much in the way of pain so we really did not complete any injections.  We have actually not done any diagnostic or therapeutic injections at the level of the disc herniation which is at L1-2 quite large.  It is on the left.  Since have seen him last unfortunately he has had 2 visits to the emergency department and one admission to the hospital for what appeared to be shortness of breath and may be some congestive heart failure type issues.  His second time in the ED was more for muscle spasms and twitching which I think may have been related to the disc herniation.  He seems to lack some understanding even though we have gone over his case several times with him.  He is on hydrocodone which she has increased over time.  He does use this mainly at night.  He reports this left-sided back pain mostly with moving the wrong way or different positions.  The hydrocodone he says does help.  He rates his average pain is a 7 out of 10 it does affect his  daily living.  He is really not getting much referral into the groin or hip or thigh.  Nothing down the leg.  His case is complicated by obesity as well as history of B-cell lymphoma.  Review of Systems  Musculoskeletal: Positive for back pain and joint pain.  All other systems reviewed and are negative.  Otherwise per HPI.  Assessment & Plan: Visit Diagnoses:  1. Chronic left-sided low back pain without sciatica   2. Radiculopathy due to lumbar intervertebral disc disorder   3. Spondylolisthesis of lumbar region   4. Myofascial pain syndrome   5. H/O total hip arthroplasty, left     Plan: Findings:  Low back pain with general degenerative spondylosis but now with disc herniation extrusion at L1-2 which I think may have been the culprit that was given him a lot of his pain while we were trying to help him a few months ago.  Interestingly last injection did seem to help his back pain quite a bit and this was more of a facet joint block at L4-5.  Recent trip to emergency department with low back spasm and muscle twitching.  I am going to complete left L2-3 transforaminal injection.  We did complete an injection at that level from an interlaminar approach but was very hard to do because of his anatomy.  This injection was several months ago prior to even the MRI finding.  Unfortunately his age may preclude discectomy at this level.    Meds & Orders: No orders of the defined types were placed in this encounter.  No orders of the defined types were placed in this encounter.   Follow-up: Return for Left L2 transforaminal epidural steroid injection.   Procedures: No procedures performed  No notes on file   Clinical History: MRI LUMBAR SPINE WITHOUT CONTRAST  TECHNIQUE: Multiplanar, multisequence MR imaging of the lumbar spine was performed. No intravenous contrast was administered.  COMPARISON:  MRI lumbar spine 05/29/2016. Plain films lumbar  spine 04/23/2016.  FINDINGS: Segmentation:  Standard.  Alignment: Unchanged. Again seen is mild convex left scoliosis and 0.5 cm anterolisthesis L4 on L5.  Vertebrae: No fracture, evidence of discitis, or bone lesion. Degenerative endplate signal change N9-8, L2-3 and L3-4 again seen.  Conus medullaris and cauda equina: Conus extends to the T12-L1 level. Conus and cauda equina appear normal.  Paraspinal and other soft tissues: Renal cyst noted.  Disc levels:  T10-11 and T11-12 are imaged in the sagittal plane only and negative.  T12-L1: Minimal disc bulge.  No stenosis.  L1-2: There is a large disc extrusion extending cephalad in a left paracentral and lateral recess position. The fragment measures approximately 2.5 cm craniocaudal by 0.8 cm AP by 1.5 cm transverse and extends almost to the T12-L1 interspace. The disc deforms the left aspect of the thecal sac and impinges on the left L1 root. It also causes narrowing in the left subarticular recess at the level of the L1-2 interspace and could impact the left L2 root. Moderate left foraminal narrowing is present. Right foramen open.  L2-3: Loss of disc space height with a shallow bulge and endplate spur. Moderate facet arthropathy. There is mild narrowing in the left lateral recess. Mild bilateral foraminal narrowing. No change.  L3-4: Mild-to-moderate bilateral facet degenerative change. Shallow disc bulge. The central canal and left foramen are open. Mild right foraminal narrowing. No change.  L4-5: Bilateral facet degenerative disease is worse on the right. The disc is uncovered with a shallow bulge. The central canal and left foramen are open. There is moderate right foraminal narrowing. No change.  L5-S1: Advanced bilateral facet degenerative change. Shallow disc bulge. No stenosis.  IMPRESSION: Large cephalad extending disc extrusion in a left paracentral and lateral recess position at L1-2  extends nearly to the T12-L1 interspace and impinges on the descending left L1 root. The disc also likely impacts the left L2 root at the level of the L1-2 disc interspace. There is moderate left foraminal narrowing at L1-2. The appearance of the lumbar spine is otherwise unchanged.  Mild left lateral recess narrowing at L2-3 due to a disc bulge.  Moderate right foraminal narrowing L4-5.   Electronically Signed   By: Inge Rise M.D.   On: 08/19/2019 13:31   He reports that he has quit smoking. He has never used smokeless tobacco.  Recent Labs    12/29/18 1605  LABURIC 6.1    Objective:  VS:  HT:5\' 9"  (175.3 cm)   WT:199 lb 14.4 oz (90.7 kg)  BMI:29.51    BP:132/66  HR:84bpm  TEMP: ( )  RESP:  Physical Exam Vitals and nursing note reviewed.  Constitutional:      General: He is not in acute distress.    Appearance: He is well-developed. He is obese.  HENT:     Head: Normocephalic and atraumatic.  Nose: Nose normal.     Mouth/Throat:     Mouth: Mucous membranes are moist.     Pharynx: Oropharynx is clear.  Eyes:     Conjunctiva/sclera: Conjunctivae normal.     Pupils: Pupils are equal, round, and reactive to light.  Neck:     Trachea: No tracheal deviation.  Cardiovascular:     Rate and Rhythm: Normal rate and regular rhythm.     Pulses: Normal pulses.  Pulmonary:     Effort: Pulmonary effort is normal.     Breath sounds: Normal breath sounds.  Abdominal:     General: There is no distension.     Palpations: Abdomen is soft.     Tenderness: There is no guarding or rebound.  Musculoskeletal:        General: No deformity.     Cervical back: Normal range of motion and neck supple.     Right lower leg: No edema.     Left lower leg: No edema.     Comments: Ambulates with antalgic gait to the left with forward flexed lumbar spine.  He uses a cane.  He has good distal strength without clonus.  He has no pain with hip rotation bilaterally.  He does have  pain in the left flank and paraspinal region.  He does seem to have active trigger points.  Skin:    General: Skin is warm and dry.     Findings: No erythema or rash.  Neurological:     General: No focal deficit present.     Mental Status: He is alert and oriented to person, place, and time.     Motor: No abnormal muscle tone.     Coordination: Coordination normal.     Gait: Gait normal.  Psychiatric:        Mood and Affect: Mood normal.        Behavior: Behavior normal.        Thought Content: Thought content normal.     Ortho Exam  Imaging: No results found.  Past Medical/Family/Surgical/Social History: Medications & Allergies reviewed per EMR, new medications updated. Patient Active Problem List   Diagnosis Date Noted  . Acute exacerbation of CHF (congestive heart failure) ([place]) 09/25/2019  . Elevated troponin 09/25/2019  . Spondylosis without myelopathy or radiculopathy, lumbar region 08/12/2019  . Spinal stenosis of lumbar region with neurogenic claudication 08/12/2019  . Myofascial pain syndrome 08/12/2019  . Chest pain 03/26/2019  . Chronic myelomonocytic leukemia not having achieved remission ([place]) 12/04/2016  . Chronic pain syndrome 09/26/2015  . De Quervain's tenosynovitis, right 09/13/2015  . Anemia due to antineoplastic chemotherapy 08/15/2015  . DLBCL (diffuse large B cell lymphoma) ([place]) 08/15/2015  . Skin lesion of face 05/21/2015  . Peripheral neuropathy due to chemotherapy ([place]) 02/09/2015  . Benign prostatic hyperplasia with urinary obstruction 02/09/2015  . Bilateral hearing loss 02/09/2015  . Low back pain 02/09/2015  . CKD (chronic kidney disease), stage III 02/09/2015  . Essential (primary) hypertension 02/09/2015  . Gastro-esophageal reflux disease without esophagitis 02/09/2015  . Anxiety, generalized 02/09/2015  . Cannot sleep 02/09/2015  . Combined fat and carbohydrate induced hyperlipemia 02/09/2015   Past Medical History:  Diagnosis Date   . Acute exacerbation of CHF (congestive heart failure) ([place]) 09/25/2019  . Anemia   . Anemia due to antineoplastic chemotherapy 08/15/2015  . Anxiety   . Arthritis   . Cataract   . Chronic kidney disease (CKD), stage III (moderate) 02/09/2015   Controlled  . CMML (  chronic myelomonocytic leukemia) ([place])    gets Aranesp about monthly, otherwise surveillance with Dr. Marin Olp  . Combined fat and carbohydrate induced hyperlipemia 02/09/2015   Controlled  . Depression   . DLBCL (diffuse large B cell lymphoma) ([place]) 08/15/2015  . Essential (primary) hypertension 02/09/2015  . Gastro-esophageal reflux disease without esophagitis 02/09/2015   Controlled  . Renal insufficiency   . Tinnitus    Family History  Problem Relation Age of Onset  . Anuerysm Mother 92       Deceased  . Colon cancer Father 19       Deceased  . Colon cancer Brother   . Prostate cancer Brother        Deceased  . Alzheimer's disease Brother   . Heart disease Brother   . Alzheimer's disease Paternal Grandmother   . Early death Maternal Grandmother        Unknown  . Obesity Son   . Obesity Son        Gastric Bypass  . Healthy Son   . Healthy Daughter   . Diabetes Neg Hx    Past Surgical History:  Procedure Laterality Date  . CATARACT EXTRACTION, BILATERAL    . COLONOSCOPY  May 2011  . CYSTOSCOPY    . TOTAL HIP ARTHROPLASTY  2003   Right   . TOTAL HIP ARTHROPLASTY  April, 2011   Left  . TRIGGER FINGER RELEASE  Nov 2013  . TURBINECTOMY  2006  . UPPER GI ENDOSCOPY  Nov 2015   Social History   Occupational History  . Not on file  Tobacco Use  . Smoking status: Former Research scientist (life sciences)  . Smokeless tobacco: Never Used  . Tobacco comment: Quit >50 years ago  Vaping Use  . Vaping Use: Never used  Substance and Sexual Activity  . Alcohol use: Yes    Alcohol/week: 1.0 - 4.0 standard drink    Types: 1 - 4 Shots of liquor per week    Comment: 4 glasses per week  . Drug use: No  . Sexual activity: Not on file

## 2019-12-17 ENCOUNTER — Telehealth: Payer: Self-pay | Admitting: Cardiology

## 2019-12-17 NOTE — Telephone Encounter (Signed)
Left message to call back  

## 2019-12-17 NOTE — Telephone Encounter (Signed)
Pt c/o medication issue:  1. Name of Medication: furosemide (LASIX) 40 MG tablet  2. How are you currently taking this medication (dosage and times per day)? 1 tablet a day  3. Are you having a reaction (difficulty breathing--STAT)? no  4. What is your medication issue? Patient states he has been very weak, tired, and sleeps half the day and all night. He would like to know if these are side effects of the medication.

## 2019-12-18 NOTE — Telephone Encounter (Signed)
Left message for pt to call.

## 2019-12-21 NOTE — Telephone Encounter (Signed)
Pt called back returning a call from our office. He said he stopped taking the medication for two days and feels much better now. Please call

## 2019-12-21 NOTE — Telephone Encounter (Signed)
Spoke to patient, he states he was taking furosemide daily from 5/24-6/25 and he became very weak, unable to drive, had no appetite, napped daily 1-2 hours, didn't exercise, had "no quality of life".    He states prior to this he was extremely active, walking daily, exercising at the gym or pool, able to go to dinner with friends, etc.  He stopped taking the lasix and is feeling better, symptoms are improving.  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     He weighs daily, monitors his O2 sat daily.   States he is not going to take daily, he will take this when his weight increases, he becomes SOB, or swelling.    He currently weighs 192 lbs (down from 202 per pt), denies any SOB or swelling.      He is also aware to only take potassium when he takes lasix.   Advised to continue to monitor weights daily.   Will make MD aware.  Advised if he starts having symptoms and having to take frequently to call to make MD aware.

## 2019-12-22 NOTE — Telephone Encounter (Signed)
Sounds reasonable, thanks [facility]

## 2019-12-30 ENCOUNTER — Encounter: Payer: Self-pay | Admitting: Family Medicine

## 2019-12-30 DIAGNOSIS — G894 Chronic pain syndrome: Secondary | ICD-10-CM

## 2019-12-30 MED ORDER — HYDROCODONE-ACETAMINOPHEN 7.5-325 MG PO TABS: 1.0000 | ORAL_TABLET | Freq: Four times a day (QID) | ORAL | 0 refills | Status: DC | PRN

## 2019-12-30 NOTE — Telephone Encounter (Signed)
Requesting: Hydrocodone Contract: none UDS: none Last Visit:12/02/2019 Next Visit: none scheduled with pcp Last Refill:11/30/2019  Please Advise

## 2019-12-31 ENCOUNTER — Inpatient Hospital Stay: Payer: Medicare Other

## 2019-12-31 ENCOUNTER — Other Ambulatory Visit: Payer: Self-pay

## 2019-12-31 ENCOUNTER — Inpatient Hospital Stay: Payer: Medicare Other | Attending: Hematology & Oncology

## 2019-12-31 VITALS — BP 104/51 | HR 94 | Temp 98.2°F | Resp 18

## 2019-12-31 DIAGNOSIS — R0602 Shortness of breath: Secondary | ICD-10-CM | POA: Diagnosis not present

## 2019-12-31 DIAGNOSIS — Z79899 Other long term (current) drug therapy: Secondary | ICD-10-CM | POA: Diagnosis not present

## 2019-12-31 DIAGNOSIS — R531 Weakness: Secondary | ICD-10-CM | POA: Insufficient documentation

## 2019-12-31 DIAGNOSIS — D6481 Anemia due to antineoplastic chemotherapy: Secondary | ICD-10-CM | POA: Insufficient documentation

## 2019-12-31 DIAGNOSIS — C9310 Chronic myelomonocytic leukemia not having achieved remission: Secondary | ICD-10-CM

## 2019-12-31 DIAGNOSIS — D508 Other iron deficiency anemias: Secondary | ICD-10-CM

## 2019-12-31 DIAGNOSIS — G629 Polyneuropathy, unspecified: Secondary | ICD-10-CM | POA: Insufficient documentation

## 2019-12-31 DIAGNOSIS — R5383 Other fatigue: Secondary | ICD-10-CM | POA: Insufficient documentation

## 2019-12-31 DIAGNOSIS — C8334 Diffuse large B-cell lymphoma, lymph nodes of axilla and upper limb: Secondary | ICD-10-CM

## 2019-12-31 LAB — CBC WITH DIFFERENTIAL (CANCER CENTER ONLY)
Abs Immature Granulocytes: 0.4 10*3/uL — ABNORMAL HIGH (ref 0.00–0.07)
Basophils Absolute: 0 10*3/uL (ref 0.0–0.1)
Basophils Relative: 0 %
Eosinophils Absolute: 0 10*3/uL (ref 0.0–0.5)
Eosinophils Relative: 0 %
HCT: 24.9 % — ABNORMAL LOW (ref 39.0–52.0)
Hemoglobin: 7.8 g/dL — ABNORMAL LOW (ref 13.0–17.0)
Immature Granulocytes: 5 %
Lymphocytes Relative: 28 %
Lymphs Abs: 2.5 10*3/uL (ref 0.7–4.0)
MCH: 37.7 pg — ABNORMAL HIGH (ref 26.0–34.0)
MCHC: 31.3 g/dL (ref 30.0–36.0)
MCV: 120.3 fL — ABNORMAL HIGH (ref 80.0–100.0)
Monocytes Absolute: 2.3 10*3/uL — ABNORMAL HIGH (ref 0.1–1.0)
Monocytes Relative: 26 %
Neutro Abs: 3.7 10*3/uL (ref 1.7–7.7)
Neutrophils Relative %: 41 %
Platelet Count: 92 10*3/uL — ABNORMAL LOW (ref 150–400)
RBC: 2.07 MIL/uL — ABNORMAL LOW (ref 4.22–5.81)
RDW: 17.4 % — ABNORMAL HIGH (ref 11.5–15.5)
WBC Count: 8.9 10*3/uL (ref 4.0–10.5)
nRBC: 0 % (ref 0.0–0.2)

## 2019-12-31 LAB — CMP (CANCER CENTER ONLY)
ALT: 9 U/L (ref 0–44)
AST: 11 U/L — ABNORMAL LOW (ref 15–41)
Albumin: 4.1 g/dL (ref 3.5–5.0)
Alkaline Phosphatase: 51 U/L (ref 38–126)
Anion gap: 11 (ref 5–15)
BUN: 33 mg/dL — ABNORMAL HIGH (ref 8–23)
CO2: 24 mmol/L (ref 22–32)
Calcium: 9.6 mg/dL (ref 8.9–10.3)
Chloride: 103 mmol/L (ref 98–111)
Creatinine: 1.94 mg/dL — ABNORMAL HIGH (ref 0.61–1.24)
GFR, Est AFR Am: 34 mL/min — ABNORMAL LOW
GFR, Estimated: 30 mL/min — ABNORMAL LOW
Glucose, Bld: 177 mg/dL — ABNORMAL HIGH (ref 70–99)
Potassium: 4.4 mmol/L (ref 3.5–5.1)
Sodium: 138 mmol/L (ref 135–145)
Total Bilirubin: 1.3 mg/dL — ABNORMAL HIGH (ref 0.3–1.2)
Total Protein: 7.2 g/dL (ref 6.5–8.1)

## 2019-12-31 MED ORDER — DARBEPOETIN ALFA 500 MCG/ML IJ SOSY
PREFILLED_SYRINGE | INTRAMUSCULAR | Status: AC
  Filled 2019-12-31: qty 1

## 2019-12-31 MED ORDER — DARBEPOETIN ALFA 500 MCG/ML IJ SOSY
500.0000 ug | PREFILLED_SYRINGE | Freq: Once | INTRAMUSCULAR | Status: AC
  Administered 2019-12-31: 500 ug via SUBCUTANEOUS

## 2019-12-31 NOTE — Patient Instructions (Signed)
Darbepoetin Alfa injection What is this medicine? DARBEPOETIN ALFA (dar be POE e tin AL fa) helps your body make more red blood cells. It is used to treat anemia caused by chronic kidney failure and chemotherapy. This medicine may be used for other purposes; ask your health care provider or pharmacist if you have questions. COMMON BRAND NAME(S): Aranesp What should I tell my health care provider before I take this medicine? They need to know if you have any of these conditions:  blood clotting disorders or history of blood clots  cancer patient not on chemotherapy  cystic fibrosis  heart disease, such as angina, heart failure, or a history of a heart attack  hemoglobin level of 12 g/dL or greater  high blood pressure  low levels of folate, iron, or vitamin B12  seizures  an unusual or allergic reaction to darbepoetin, erythropoietin, albumin, hamster proteins, latex, other medicines, foods, dyes, or preservatives  pregnant or trying to get pregnant  breast-feeding How should I use this medicine? This medicine is for injection into a vein or under the skin. It is usually given by a health care professional in a hospital or clinic setting. If you get this medicine at home, you will be taught how to prepare and give this medicine. Use exactly as directed. Take your medicine at regular intervals. Do not take your medicine more often than directed. It is important that you put your used needles and syringes in a special sharps container. Do not put them in a trash can. If you do not have a sharps container, call your pharmacist or healthcare provider to get one. A special MedGuide will be given to you by the pharmacist with each prescription and refill. Be sure to read this information carefully each time. Talk to your pediatrician regarding the use of this medicine in children. While this medicine may be used in children as young as 1 month of age for selected conditions, precautions do  apply. Overdosage: If you think you have taken too much of this medicine contact a poison control center or emergency room at once. NOTE: This medicine is only for you. Do not share this medicine with others. What if I miss a dose? If you miss a dose, take it as soon as you can. If it is almost time for your next dose, take only that dose. Do not take double or extra doses. What may interact with this medicine? Do not take this medicine with any of the following medications:  epoetin alfa This list may not describe all possible interactions. Give your health care provider a list of all the medicines, herbs, non-prescription drugs, or dietary supplements you use. Also tell them if you smoke, drink alcohol, or use illegal drugs. Some items may interact with your medicine. What should I watch for while using this medicine? Your condition will be monitored carefully while you are receiving this medicine. You may need blood work done while you are taking this medicine. This medicine may cause a decrease in vitamin B6. You should make sure that you get enough vitamin B6 while you are taking this medicine. Discuss the foods you eat and the vitamins you take with your health care professional. What side effects may I notice from receiving this medicine? Side effects that you should report to your doctor or health care professional as soon as possible:  allergic reactions like skin rash, itching or hives, swelling of the face, lips, or tongue  breathing problems  changes in   vision  chest pain  confusion, trouble speaking or understanding  feeling faint or lightheaded, falls  high blood pressure  muscle aches or pains  pain, swelling, warmth in the leg  rapid weight gain  severe headaches  sudden numbness or weakness of the face, arm or leg  trouble walking, dizziness, loss of balance or coordination  seizures (convulsions)  swelling of the ankles, feet, hands  unusually weak or  tired Side effects that usually do not require medical attention (report to your doctor or health care professional if they continue or are bothersome):  diarrhea  fever, chills (flu-like symptoms)  headaches  nausea, vomiting  redness, stinging, or swelling at site where injected This list may not describe all possible side effects. Call your doctor for medical advice about side effects. You may report side effects to FDA at 1-800-FDA-1088. Where should I keep my medicine? Keep out of the reach of children. Store in a refrigerator between 2 and 8 degrees C (36 and 46 degrees F). Do not freeze. Do not shake. Throw away any unused portion if using a single-dose vial. Throw away any unused medicine after the expiration date. NOTE: This sheet is a summary. It may not cover all possible information. If you have questions about this medicine, talk to your doctor, pharmacist, or health care provider.  2020 Elsevier/Gold Standard (2017-06-26 16:44:20)  

## 2020-01-04 ENCOUNTER — Telehealth: Payer: Self-pay | Admitting: Physical Medicine and Rehabilitation

## 2020-01-04 NOTE — Telephone Encounter (Signed)
Ok, noted, can do bilateral injection

## 2020-01-04 NOTE — Telephone Encounter (Signed)
Called patient to advise  °

## 2020-01-04 NOTE — Telephone Encounter (Signed)
Pt called wanting to let Dr.Newton know his pain is stretching across his lower back and in the morning it can be very intense. Pt would like a CB  [phone]

## 2020-01-04 NOTE — Telephone Encounter (Signed)
Patient is scheduled for an [facility] on Wednesday 7/14. Please advise.

## 2020-01-06 ENCOUNTER — Ambulatory Visit: Payer: Self-pay

## 2020-01-06 ENCOUNTER — Other Ambulatory Visit: Payer: Self-pay

## 2020-01-06 ENCOUNTER — Encounter: Payer: Self-pay | Admitting: Physical Medicine and Rehabilitation

## 2020-01-06 ENCOUNTER — Ambulatory Visit (INDEPENDENT_AMBULATORY_CARE_PROVIDER_SITE_OTHER): Payer: Medicare Other | Admitting: Physical Medicine and Rehabilitation

## 2020-01-06 VITALS — BP 105/57 | HR 85

## 2020-01-06 DIAGNOSIS — M5416 Radiculopathy, lumbar region: Secondary | ICD-10-CM

## 2020-01-06 DIAGNOSIS — M5116 Intervertebral disc disorders with radiculopathy, lumbar region: Secondary | ICD-10-CM | POA: Diagnosis not present

## 2020-01-06 DIAGNOSIS — M48062 Spinal stenosis, lumbar region with neurogenic claudication: Secondary | ICD-10-CM

## 2020-01-06 MED ORDER — METHYLPREDNISOLONE ACETATE 80 MG/ML IJ SUSP
80.0000 mg | Freq: Once | INTRAMUSCULAR | Status: AC
  Administered 2020-01-06: 80 mg

## 2020-01-06 NOTE — Progress Notes (Signed)
Jeremiah Walker - 86 y.o. male MRN [id number]  Date of birth: [birth year]  Office Visit Note: Visit Date: 01/06/2020 PCP: [name], MD Referred by: [name], MD  Subjective: Chief Complaint  Patient presents with  . Lower Back - Pain   HPI: Jeremiah Walker is a 86 y.o. male who comes in today for planned repeat Left L2-L3 Lumbar epidural steroid injection with fluoroscopic guidance.  The patient has failed conservative care including home exercise, medications, time and activity modification.  This injection will be diagnostic and hopefully therapeutic.  Please see requesting physician notes for further details and justification. Patient received more than 50% pain relief from prior injection.   Referring: Lamar Blinks, MD  ROS Otherwise per HPI.  Assessment & Plan: Visit Diagnoses:  1. Lumbar radiculopathy   2. Spinal stenosis of lumbar region with neurogenic claudication   3. Radiculopathy due to lumbar intervertebral disc disorder     Plan: No additional findings.   Meds & Orders:  Meds ordered this encounter  Medications  . methylPREDNISolone acetate (DEPO-MEDROL) injection 80 mg    Orders Placed This Encounter  Procedures  . XR C-ARM NO REPORT  . Epidural Steroid injection    Follow-up: Return if symptoms worsen or fail to improve.   Procedures: No procedures performed  Lumbosacral Transforaminal Epidural Steroid Injection - Sub-Pedicular Approach with Fluoroscopic Guidance  Patient: Jeremiah Walker      Date of Birth: 1927/12/31 MRN: [id number] PCP: [name], MD      Visit Date: 01/06/2020   Universal Protocol:    Date/Time: 01/06/2020  Consent Given By: the patient  Position: PRONE  Additional Comments: Vital signs were monitored before and after the procedure. Patient was prepped and draped in the usual sterile fashion. The correct patient, procedure, and site was verified.   Injection Procedure Details:    Procedure Site One Meds Administered:  Meds ordered this encounter  Medications  . methylPREDNISolone acetate (DEPO-MEDROL) injection 80 mg    Laterality: Left  Location/Site:  L2-L3  Needle size: 22 G  Needle type: Spinal  Needle Placement: Transforaminal  Findings:    -Comments: Excellent flow of contrast along the nerve, nerve root and into the epidural space. Initial flow was intra-discal, then repositioned.  Procedure Details: After squaring off the end-plates to get a true AP view, the C-arm was positioned so that an oblique view of the foramen as noted above was visualized. The target area is just inferior to the "nose of the scotty dog" or sub pedicular. The soft tissues overlying this structure were infiltrated with 2-3 ml. of 1% Lidocaine without Epinephrine.  The spinal needle was inserted toward the target using a "trajectory" view along the fluoroscope beam.  Under AP and lateral visualization, the needle was advanced so it did not puncture dura and was located close the 6 O'Clock position of the pedical in AP tracterory. Biplanar projections were used to confirm position. Aspiration was confirmed to be negative for CSF and/or blood. A 1-2 ml. volume of Isovue-250 was injected and flow of contrast was noted at each level. Radiographs were obtained for documentation purposes.   After attaining the desired flow of contrast documented above, a 0.5 to 1.0 ml test dose of 0.25% Marcaine was injected into each respective transforaminal space.  The patient was observed for 90 seconds post injection.  After no sensory deficits were reported, and normal lower extremity motor function was noted,   the above injectate  was administered so that equal amounts of the injectate were placed at each foramen (level) into the transforaminal epidural space.   Additional Comments:  The patient tolerated the procedure well Dressing: 2 x 2 sterile gauze and Band-Aid    Post-procedure  details: Patient was observed during the procedure. Post-procedure instructions were reviewed.  Patient left the clinic in stable condition.      Clinical History: MRI LUMBAR SPINE WITHOUT CONTRAST  TECHNIQUE: Multiplanar, multisequence MR imaging of the lumbar spine was performed. No intravenous contrast was administered.  COMPARISON:  MRI lumbar spine 05/29/2016. Plain films lumbar spine 04/23/2016.  FINDINGS: Segmentation:  Standard.  Alignment: Unchanged. Again seen is mild convex left scoliosis and 0.5 cm anterolisthesis L4 on L5.  Vertebrae: No fracture, evidence of discitis, or bone lesion. Degenerative endplate signal change J6-2, L2-3 and L3-4 again seen.  Conus medullaris and cauda equina: Conus extends to the T12-L1 level. Conus and cauda equina appear normal.  Paraspinal and other soft tissues: Renal cyst noted.  Disc levels:  T10-11 and T11-12 are imaged in the sagittal plane only and negative.  T12-L1: Minimal disc bulge.  No stenosis.  L1-2: There is a large disc extrusion extending cephalad in a left paracentral and lateral recess position. The fragment measures approximately 2.5 cm craniocaudal by 0.8 cm AP by 1.5 cm transverse and extends almost to the T12-L1 interspace. The disc deforms the left aspect of the thecal sac and impinges on the left L1 root. It also causes narrowing in the left subarticular recess at the level of the L1-2 interspace and could impact the left L2 root. Moderate left foraminal narrowing is present. Right foramen open.  L2-3: Loss of disc space height with a shallow bulge and endplate spur. Moderate facet arthropathy. There is mild narrowing in the left lateral recess. Mild bilateral foraminal narrowing. No change.  L3-4: Mild-to-moderate bilateral facet degenerative change. Shallow disc bulge. The central canal and left foramen are open. Mild right foraminal narrowing. No change.  L4-5: Bilateral facet  degenerative disease is worse on the right. The disc is uncovered with a shallow bulge. The central canal and left foramen are open. There is moderate right foraminal narrowing. No change.  L5-S1: Advanced bilateral facet degenerative change. Shallow disc bulge. No stenosis.  IMPRESSION: Large cephalad extending disc extrusion in a left paracentral and lateral recess position at L1-2 extends nearly to the T12-L1 interspace and impinges on the descending left L1 root. The disc also likely impacts the left L2 root at the level of the L1-2 disc interspace. There is moderate left foraminal narrowing at L1-2. The appearance of the lumbar spine is otherwise unchanged.  Mild left lateral recess narrowing at L2-3 due to a disc bulge.  Moderate right foraminal narrowing L4-5.   Electronically Signed   By: Inge Rise M.D.   On: 08/19/2019 13:31   He reports that he has quit smoking. He has never used smokeless tobacco. No results for input(s): HGBA1C, LABURIC in the last 8760 hours.  Objective:  VS:  HT:    WT:   BMI:     BP:(!) 105/57  HR:85bpm  TEMP: ( )  RESP:  Physical Exam Constitutional:      General: He is not in acute distress.    Appearance: Normal appearance. He is not ill-appearing.  HENT:     Head: Normocephalic and atraumatic.     Right Ear: External ear normal.     Left Ear: External ear normal.  Eyes:  Extraocular Movements: Extraocular movements intact.  Cardiovascular:     Rate and Rhythm: Normal rate.     Pulses: Normal pulses.  Abdominal:     General: There is no distension.     Palpations: Abdomen is soft.  Musculoskeletal:        General: No tenderness or signs of injury.     Right lower leg: No edema.     Left lower leg: No edema.     Comments: Patient has good distal strength without clonus.  Skin:    Findings: No erythema or rash.  Neurological:     General: No focal deficit present.     Mental Status: He is alert and oriented to  person, place, and time.     Sensory: No sensory deficit.     Motor: No weakness or abnormal muscle tone.     Coordination: Coordination normal.  Psychiatric:        Mood and Affect: Mood normal.        Behavior: Behavior normal.     Ortho Exam  Imaging: No results found.  Past Medical/Family/Surgical/Social History: Medications & Allergies reviewed per EMR, new medications updated. Patient Active Problem List   Diagnosis Date Noted  . Acute exacerbation of CHF (congestive heart failure) ([place]) 09/25/2019  . Elevated troponin 09/25/2019  . Spondylosis without myelopathy or radiculopathy, lumbar region 08/12/2019  . Spinal stenosis of lumbar region with neurogenic claudication 08/12/2019  . Myofascial pain syndrome 08/12/2019  . Chest pain 03/26/2019  . 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 Chronic pain syndrome 09/26/2015  . De Quervain's tenosynovitis, right 09/13/2015  . Anemia due to antineoplastic chemotherapy 08/15/2015  . DLBCL (diffuse large B cell lymphoma) ([place]) 08/15/2015  . Skin lesion of face 05/21/2015  . Peripheral neuropathy due to chemotherapy ([place]) 02/09/2015  . Benign prostatic hyperplasia with urinary obstruction 02/09/2015  . Bilateral hearing loss 02/09/2015  . Low back pain 02/09/2015  . CKD (chronic kidney disease), stage III 02/09/2015  . Essential (primary) hypertension 02/09/2015  . Gastro-esophageal reflux disease without esophagitis 02/09/2015  . Anxiety, generalized 02/09/2015  . Cannot sleep 02/09/2015  . Combined fat and carbohydrate induced hyperlipemia 02/09/2015   Past Medical History:  Diagnosis Date  . Acute exacerbation of CHF (congestive heart failure) ([place]) 09/25/2019  . Anemia   . Anemia due to antineoplastic chemotherapy 08/15/2015  . Anxiety   . Arthritis   . Cataract   . Chronic kidney disease (CKD), stage III (moderate) 02/09/2015   Controlled  . CMML (chronic myelomonocytic leukemia)  ([place])    gets Aranesp about monthly, otherwise surveillance with Dr. Marin Olp  . Combined fat and carbohydrate induced hyperlipemia 02/09/2015   Controlled  . Depression   . DLBCL (diffuse large B cell lymphoma) ([place]) 08/15/2015  . Essential (primary) hypertension 02/09/2015  . Gastro-esophageal reflux disease without esophagitis 02/09/2015   Controlled  . Renal insufficiency   . Tinnitus    Family History  Problem Relation Age of Onset  . Anuerysm Mother 19       Deceased  . Colon cancer Father 85       Deceased  . Colon cancer Brother   . Prostate cancer Brother        Deceased  . Alzheimer's disease Brother   . Heart disease Brother   . Alzheimer's disease Paternal Grandmother   . Early death Maternal Grandmother        Unknown  . Obesity Son   .  Obesity Son        Gastric Bypass  . Healthy Son   . Healthy Daughter   . Diabetes Neg Hx    Past Surgical History:  Procedure Laterality Date  . CATARACT EXTRACTION, BILATERAL    . COLONOSCOPY  May 2011  . CYSTOSCOPY    . TOTAL HIP ARTHROPLASTY  2003   Right   . TOTAL HIP ARTHROPLASTY  April, 2011   Left  . TRIGGER FINGER RELEASE  Nov 2013  . TURBINECTOMY  2006  . UPPER GI ENDOSCOPY  Nov 2015   Social History   Occupational History  . Not on file  Tobacco Use  . Smoking status: Former Research scientist (life sciences)  . Smokeless tobacco: Never Used  . Tobacco comment: Quit >50 years ago  Vaping Use  . Vaping Use: Never used  Substance and Sexual Activity  . Alcohol use: Yes    Alcohol/week: 1.0 - 4.0 standard drink    Types: 1 - 4 Shots of liquor per week    Comment: 4 glasses per week  . Drug use: No  . Sexual activity: Not on file

## 2020-01-06 NOTE — Progress Notes (Signed)
  PT states left lower back pain and something the right could have pain. Pt states walking around makes it hurt worse. Pt states medicine makes it feel better.   Numeric Pain Rating Scale and Functional Assessment Average Pain 8   In the last MONTH (on 0-10 scale) has pain interfered with the following?  1. General activity like being  able to carry out your everyday physical activities such as walking, climbing stairs, carrying groceries, or moving a chair?  Rating(8)   +Driver, -BT, -Dye Allergies.

## 2020-01-13 ENCOUNTER — Other Ambulatory Visit: Payer: Self-pay

## 2020-01-13 ENCOUNTER — Encounter: Payer: Self-pay | Admitting: Cardiology

## 2020-01-13 ENCOUNTER — Ambulatory Visit (INDEPENDENT_AMBULATORY_CARE_PROVIDER_SITE_OTHER): Payer: Medicare Other | Admitting: Cardiology

## 2020-01-13 VITALS — BP 146/66 | HR 88 | Ht 69.0 in | Wt 196.0 lb

## 2020-01-13 DIAGNOSIS — E785 Hyperlipidemia, unspecified: Secondary | ICD-10-CM | POA: Diagnosis not present

## 2020-01-13 DIAGNOSIS — N1832 Chronic kidney disease, stage 3b: Secondary | ICD-10-CM | POA: Diagnosis not present

## 2020-01-13 DIAGNOSIS — I5033 Acute on chronic diastolic (congestive) heart failure: Secondary | ICD-10-CM | POA: Diagnosis not present

## 2020-01-13 DIAGNOSIS — I10 Essential (primary) hypertension: Secondary | ICD-10-CM

## 2020-01-13 DIAGNOSIS — I350 Nonrheumatic aortic (valve) stenosis: Secondary | ICD-10-CM | POA: Diagnosis not present

## 2020-01-13 DIAGNOSIS — I5032 Chronic diastolic (congestive) heart failure: Secondary | ICD-10-CM | POA: Diagnosis not present

## 2020-01-13 MED ORDER — FUROSEMIDE 40 MG PO TABS: 40.0000 mg | ORAL_TABLET | Freq: Every day | ORAL | 1 refills | Status: DC | PRN

## 2020-01-13 NOTE — Progress Notes (Signed)
Cardiology Office Note:    Date:  01/16/2020   ID:  Jeremiah Walker, DOB July 13, 1927, MRN [id number]  PCP:  [name], MD  Cardiologist:  Donato Heinz, MD  Electrophysiologist:  None   Referring MD: [name], MD   Chief Complaint  Patient presents with  . Congestive Heart Failure    History of Present Illness:    Jeremiah Walker is a 86 y.o. male with a hx of CML, CKD stage III, hypertension who presents for follow-up.  Jeremiah Walker was admitted to [facility] for chest pain on 03/26/2019.  Jeremiah Walker reported that Jeremiah Walker had been doing water aerobics and while Jeremiah Walker had no chest pain while exercising, that evening Jeremiah Walker was doing stretches on the floor and began having substernal chest pain.  Pain lasted from 9 PM that night until 7 AM the following morning.  EKG showed no acute ST/T abnormalities.  Labs showed mild elevation in high-sensitivity troponin (56->66).  Jeremiah Walker had a CTPA which showed no evidence of PE but did note coronary artery atherosclerosis.  Nuclear stress test was done, which showed no ischemia but did show a large fixed defect suggestive of prior infarct.  TTE was done which showed normal ejection fraction, suggesting perfusion defect on stress test was likely artifact.  TTE was also notable for moderate aortic stenosis.    Jeremiah Walker was admitted to [facility] from 4/2 through 09/27/2019 with progressive dyspnea.  CTA chest showed moderate bilateral pleural effusions.  BNP 722 on 4/2.  TTE showed LVEF 55 to 37%, grade 1 diastolic dysfunction, normal RV systolic function, moderate aortic stenosis.  Jeremiah Walker was admitted for CHF exacerbation and diuresed with IV Lasix, with significant provement in Jeremiah Walker symptoms.  Jeremiah Walker was discharged on as needed Lasix.    Since last clinic visit, Jeremiah Walker reports that Jeremiah Walker took Lasix for 3 to 4 weeks. States that Jeremiah Walker initially felt improved and lost 15 pounds. However then Jeremiah Walker started to feel worse. States that Jeremiah Walker has felt very fatigued. Started taking lasix as needed. States Jeremiah Walker is only  taking twice in the last 2 weeks. Jeremiah Walker is checking daily weights and has been stable. Jeremiah Walker denies any chest pain, dyspnea, lightheadedness, or syncope. Main complaint is that Jeremiah Walker still has significant fatigue.   Wt Readings from Last 3 Encounters:  01/15/20 191 lb 8 oz (86.9 kg)  01/13/20 196 lb (88.9 kg)  12/10/19 197 lb 12.8 oz (89.7 kg)       Past Medical History:  Diagnosis Date  . Acute exacerbation of CHF (congestive heart failure) ([place]) 09/25/2019  . Anemia   . Anemia due to antineoplastic chemotherapy 08/15/2015  . Anxiety   . Arthritis   . Cataract   . Chronic kidney disease (CKD), stage III (moderate) 02/09/2015   Controlled  . CMML (chronic myelomonocytic leukemia) ([place])    gets Aranesp about monthly, otherwise surveillance with Dr. Marin Olp  . Combined fat and carbohydrate induced hyperlipemia 02/09/2015   Controlled  . Depression   . DLBCL (diffuse large B cell lymphoma) ([place]) 08/15/2015  . Essential (primary) hypertension 02/09/2015  . Gastro-esophageal reflux disease without esophagitis 02/09/2015   Controlled  . Renal insufficiency   . Tinnitus     Past Surgical History:  Procedure Laterality Date  . CATARACT EXTRACTION, BILATERAL    . COLONOSCOPY  May 2011  . CYSTOSCOPY    . TOTAL HIP ARTHROPLASTY  2003   Right   . TOTAL HIP ARTHROPLASTY  April, 2011   Left  .  TRIGGER FINGER RELEASE  Nov 2013  . TURBINECTOMY  2006  . UPPER GI ENDOSCOPY  Nov 2015    Current Medications: Current Meds  Medication Sig  . allopurinol (ZYLOPRIM) 100 MG tablet TAKE 2 TABLETS(200 MG) BY MOUTH DAILY (Patient taking differently: Take 200 mg by mouth daily with breakfast. )  . ALPRAZolam (XANAX) 0.5 MG tablet TAKE 1 TABLET(0.5 MG) BY MOUTH THREE TIMES DAILY AS NEEDED FOR ANXIETY  . amoxicillin (AMOXIL) 500 MG capsule Take 4 caps (2gm) by mouth once prior to dental procedure  . azelastine (ASTELIN) 0.1 % nasal spray Place 2 sprays into both nostrils 2 (two) times daily. Use in each  nostril as directed (Patient taking differently: Place 2 sprays into both nostrils 2 (two) times daily as needed (for seasonal allergies). )  . b complex vitamins tablet Take 1 tablet by mouth daily.  . baclofen (LIORESAL) 10 MG tablet Take 1/2 to 1 by mouth every 8hrs as needed for spasm  . bethanechol (URECHOLINE) 25 MG tablet TAKE 1 TABLET(25 MG) BY MOUTH THREE TIMES DAILY (Patient taking differently: Take 25 mg by mouth 3 (three) times daily. )  . Calcium 500 MG tablet Take 500 mg by mouth daily.   . Cholecalciferol (VITAMIN D3) 2000 UNITS capsule Take 2,000 Units by mouth daily.  . Cyanocobalamin (VITAMIN B-12 PO) Take 1 tablet by mouth daily after breakfast.  . folic acid (FOLVITE) 1 MG tablet TAKE 2 TABLETS(2 MG) BY MOUTH DAILY  . gabapentin (NEURONTIN) 300 MG capsule TAKE 3 CAPSULES BY MOUTH THREE TIMES DAILY, PLUS 2 CAPSULES EVERY DAY AS NEEDED  . HYDROcodone-acetaminophen (NORCO) 7.5-325 MG tablet Take 1 tablet by mouth every 6 (six) hours as needed for moderate pain.  . Magnesium Oxide 200 MG TABS Take 200 mg by mouth daily.   . Melatonin 10 MG TABS Take 20 mg by mouth at bedtime.   . Multiple Vitamin (MULTIVITAMIN) tablet Take 1 tablet by mouth daily.   . Omega-3 Fatty Acids (OMEGA-3 FISH OIL PO) Take 1 capsule by mouth daily. EPA/DHA 520/350   . Polyethyl Glyc-Propyl Glyc PF (SYSTANE ULTRA PF) 0.4-0.3 % SOLN Place 1-2 drops into both eyes 3 (three) times daily as needed (for dryness).   . polyethylene glycol (MIRALAX / GLYCOLAX) packet Take 17 g by mouth daily as needed (into 6-8 ounces of water and drink (in conjunction with Metamucil) once a day only when taking Norco).   . Potassium Chloride ER 20 MEQ TBCR Take 20 mEq by mouth daily.  . predniSONE (DELTASONE) 20 MG tablet May take 1 tablet daily for up to 3 days as needed for gout attack or muscle spasm  . Psyllium (METAMUCIL PO) Take by mouth See admin instructions. Mix 1 teaspoonful into 6-8 ounces of water and drink (in  conjunction with Miralax) once a day only when taking Norco  . Pyridoxine HCl (VITAMIN B-6 PO) Take 1 tablet by mouth daily after breakfast.  . vitamin C (ASCORBIC ACID) 500 MG tablet Take 1,000 mg by mouth daily.   Marland Kitchen zinc gluconate 50 MG tablet Take 50 mg by mouth daily.   Current Facility-Administered Medications for the 01/13/20 encounter (Office Visit) with Donato Heinz, MD  Medication  . methylPREDNISolone acetate (DEPO-MEDROL) injection 80 mg     Allergies:   Nitroglycerin, Nsaids, Statins, Tolmetin, Ciprofloxacin, Ropinirole, and Requip [ropinirole hcl]   Social History   Socioeconomic History  . Marital status: Widowed    Spouse name: Not on file  . Number  of children: Not on file  . Years of education: Not on file  . Highest education level: Not on file  Occupational History  . Not on file  Tobacco Use  . Smoking status: Former Research scientist (life sciences)  . Smokeless tobacco: Never Used  . Tobacco comment: Quit >50 years ago  Vaping Use  . Vaping Use: Never used  Substance and Sexual Activity  . Alcohol use: Yes    Alcohol/week: 1.0 - 4.0 standard drink    Types: 1 - 4 Shots of liquor per week    Comment: 4 glasses per week  . Drug use: No  . Sexual activity: Not on file  Other Topics Concern  . Not on file  Social History Narrative  . Not on file   Social Determinants of Health   Financial Resource Strain: Low Risk   . Difficulty of Paying Living Expenses: Not hard at all  Food Insecurity: No Food Insecurity  . Worried About Charity fundraiser in the Last Year: Never true  . Ran Out of Food in the Last Year: Never true  Transportation Needs: No Transportation Needs  . Lack of Transportation (Medical): No  . Lack of Transportation (Non-Medical): No  Physical Activity:   . Days of Exercise per Week:   . Minutes of Exercise per Session:   Stress:   . Feeling of Stress :   Social Connections:   . Frequency of Communication with Friends and Family:   . Frequency  of Social Gatherings with Friends and Family:   . Attends Religious Services:   . Active Member of Clubs or Organizations:   . Attends Archivist Meetings:   Marland Kitchen Marital Status:      Family History: The patient's family history includes Alzheimer's disease in Jeremiah Walker brother and paternal grandmother; Anuerysm (age of onset: 58) in Jeremiah Walker mother; Colon cancer in Jeremiah Walker brother; Colon cancer (age of onset: 35) in Jeremiah Walker father; Early death in Jeremiah Walker maternal grandmother; Healthy in Jeremiah Walker daughter and son; Heart disease in Jeremiah Walker brother; Obesity in Jeremiah Walker son and son; Prostate cancer in Jeremiah Walker brother. There is no history of Diabetes.  ROS:   Please see the history of present illness.     All other systems reviewed and are negative.  EKGs/Labs/Other Studies Reviewed:    The following studies were reviewed today:   EKG:  EKG is ordered today and shows normal sinus rhythm, rate 88, PVC, LVH with repolarization  Stress MPI 03/26/19:  There was no ST segment deviation noted during stress.  No T wave inversion was noted during stress.  Defect 1: There is a large defect of moderate severity present in the basal anteroseptal, basal inferoseptal, basal inferior, basal inferolateral, basal anterolateral, mid inferoseptal, mid inferior, mid inferolateral, apical inferior and apical lateral location.  Findings consistent with prior myocardial infarction.  The left ventricular ejection fraction is mildly decreased (45-54%).  This is a high risk study.  No prior for comparison.   Large, severe fixed defect primarily in inferolateral/inferior walls and extending to adjacent walls, especially at base. Reduced EF with matching wall motion abnormalities. Consistent with infarct. No reversible ischemia seen.  TTE 03/27/19:  1. Left ventricular ejection fraction, by visual estimation, is 60 to 65%. The left ventricle has normal function. There is moderately increased left ventricular hypertrophy.  2. Elevated left  atrial and left ventricular end-diastolic pressures.  3. Left ventricular diastolic Doppler parameters are consistent with impaired relaxation pattern of LV diastolic filling.  4. Global  right ventricle has normal systolic function.The right ventricular size is normal. No increase in right ventricular wall thickness.  5. Left atrial size was moderately dilated.  6. Right atrial size was normal.  7. Moderate mitral annular calcification.  8. The mitral valve is abnormal. Mild mitral valve regurgitation.  9. The tricuspid valve is grossly normal. Tricuspid valve regurgitation is trivial. 10. The aortic valve is tricuspid Aortic valve regurgitation was not visualized by color flow Doppler. Moderate aortic valve stenosis. 11. The pulmonic valve was grossly normal. Pulmonic valve regurgitation is trivial by color flow Doppler.   Recent Labs: 12/02/2019: Pro B Natriuretic peptide (BNP) 1,021.0 12/08/2019: BNP 730.2 12/31/2019: ALT 9; Hemoglobin 7.8; Platelet Count 92 01/13/2020: BUN 26; Creatinine, Ser 1.90; Magnesium 2.1; Potassium 4.5; Sodium 143  Recent Lipid Panel    Component Value Date/Time   CHOL 141 03/26/2019 0055   TRIG 69 03/26/2019 0055   HDL 41 03/26/2019 0055   CHOLHDL 3.4 03/26/2019 0055   VLDL 14 03/26/2019 0055   LDLCALC 86 03/26/2019 0055    Physical Exam:    VS:  BP (!) 146/66   Pulse 88   Ht 5\' 9"  (1.753 m)   Wt 196 lb (88.9 kg)   SpO2 98%   BMI 28.94 kg/m     Wt Readings from Last 3 Encounters:  01/15/20 191 lb 8 oz (86.9 kg)  01/13/20 196 lb (88.9 kg)  12/10/19 197 lb 12.8 oz (89.7 kg)     GEN:  in no acute distress HEENT: Normal NECK: + JVD LYMPHATICS: No lymphadenopathy CARDIAC: RRR, 3/6 systolic murmur loudest at RUSB RESPIRATORY: CTAB ABDOMEN: Soft, non-tender, non-distended MUSCULOSKELETAL:  No edema; No deformity  SKIN: Warm and dry NEUROLOGIC:  Alert and oriented x 3 PSYCHIATRIC:  Normal affect   ASSESSMENT:    1. Chronic diastolic  congestive heart failure ([place])   2. Aortic stenosis, moderate   3. Chronic renal failure, stage 3b   4. Essential hypertension   5. Hyperlipidemia, unspecified hyperlipidemia type    PLAN:     Chronic diastolic heart failure: admission in April 2021 for decompensated heart failure, responded well to IV Lasix.  Discharged on as needed p.o. Lasix.  Worsening symptoms recently was started on p.o. Lasix 40 mg daily last month.  Improved with daily Lasix, states that Jeremiah Walker lost 15 pounds. Now on as needed Lasix -Continue Lasix 40 mg daily as needed -Check BMP, magnesium  Aortic stenosis: moderate AS on echo 09/2019, will follow with annual echo  Chest pain:atypical symptoms.  Stress MPI shows fixed defect, mild systolic dysfunction.  TTE showed normal EF, suggesting likely artifact on stress MPI  HTN:not on any agents currently. Blood pressures stable  HLD: developed rhabo while on statin therapy in the past. LDL 85 without current therapy.   GEX:BMWU anemia and thrombocytopenia. Followed by Dr. Marin Olp through [facility]. On aranesp q3 weeks.   CKD IIIb:Cr 1.9 on 12/31/2019.  Will check BMP as above  RTC in 2 months  Medication Adjustments/Labs and Tests Ordered: Current medicines are reviewed at length with the patient today.  Concerns regarding medicines are outlined above.  Orders Placed This Encounter  Procedures  . Basic metabolic panel  . Magnesium  . EKG 12-Lead   Meds ordered this encounter  Medications  . furosemide (LASIX) 40 MG tablet    Sig: Take 1 tablet (40 mg total) by mouth daily as needed for edema.    Dispense:  30 tablet    Refill:  1    Patient Instructions  Medication Instructions:  Continue taking furosemide (Lasix) as needed  *If you need a refill on your cardiac medications before your next appointment, please call your pharmacy*   Lab Work: BMET, Mag today  If you have labs (blood work) drawn today and your tests are completely normal,  you will receive your results only by: Marland Kitchen MyChart Message (if you have MyChart) OR . A paper copy in the mail If you have any lab test that is abnormal or we need to change your treatment, we will call you to review the results.   Follow-Up: At [facility], you and your health needs are our priority.  As part of our continuing mission to provide you with exceptional heart care, we have created designated Provider Care Teams.  These Care Teams include your primary Cardiologist (physician) and Advanced Practice Providers (APPs -  Physician Assistants and Nurse Practitioners) who all work together to provide you with the care you need, when you need it.  We recommend signing up for the patient portal called "MyChart".  Sign up information is provided on this After Visit Summary.  MyChart is used to connect with patients for Virtual Visits (Telemedicine).  Patients are able to view lab/test results, encounter notes, upcoming appointments, etc.  Non-urgent messages can be sent to your provider as well.   To learn more about what you can do with MyChart, go to [email or web address].    Your next appointment:   2 month(s)  The format for your next appointment:   In Person  Provider:   Oswaldo Milian, MD       Signed, Donato Heinz, MD  01/16/2020 11:43 AM    [place]

## 2020-01-13 NOTE — Patient Instructions (Signed)
Medication Instructions:  Continue taking furosemide (Lasix) as needed  *If you need a refill on your cardiac medications before your next appointment, please call your pharmacy*   Lab Work: BMET, Mag today  If you have labs (blood work) drawn today and your tests are completely normal, you will receive your results only by: Marland Kitchen MyChart Message (if you have MyChart) OR . A paper copy in the mail If you have any lab test that is abnormal or we need to change your treatment, we will call you to review the results.   Follow-Up: At [facility], you and your health needs are our priority.  As part of our continuing mission to provide you with exceptional heart care, we have created designated Provider Care Teams.  These Care Teams include your primary Cardiologist (physician) and Advanced Practice Providers (APPs -  Physician Assistants and Nurse Practitioners) who all work together to provide you with the care you need, when you need it.  We recommend signing up for the patient portal called "MyChart".  Sign up information is provided on this After Visit Summary.  MyChart is used to connect with patients for Virtual Visits (Telemedicine).  Patients are able to view lab/test results, encounter notes, upcoming appointments, etc.  Non-urgent messages can be sent to your provider as well.   To learn more about what you can do with MyChart, go to [email or web address].    Your next appointment:   2 month(s)  The format for your next appointment:   In Person  Provider:   Oswaldo Milian, MD

## 2020-01-14 LAB — MAGNESIUM: Magnesium: 2.1 mg/dL (ref 1.6–2.3)

## 2020-01-14 LAB — BASIC METABOLIC PANEL
BUN/Creatinine Ratio: 14 (ref 10–24)
BUN: 26 mg/dL (ref 10–36)
CO2: 23 mmol/L (ref 20–29)
Calcium: 9.3 mg/dL (ref 8.6–10.2)
Chloride: 102 mmol/L (ref 96–106)
Creatinine, Ser: 1.9 mg/dL — ABNORMAL HIGH (ref 0.76–1.27)
GFR calc Af Amer: 35 mL/min/{1.73_m2} — ABNORMAL LOW (ref >59)
GFR calc non Af Amer: 30 mL/min/{1.73_m2} — ABNORMAL LOW (ref >59)
Glucose: 110 mg/dL — ABNORMAL HIGH (ref 65–99)
Potassium: 4.5 mmol/L (ref 3.5–5.2)
Sodium: 143 mmol/L (ref 134–144)

## 2020-01-14 NOTE — Progress Notes (Signed)
Subjective:   Jeremiah Walker is a 86 y.o. male who presents for Medicare Annual/Subsequent preventive examination.  Review of Systems     Cardiac Risk Factors include: advanced age (>38men, >72 women);dyslipidemia;hypertension;male gender     Objective:    Today's Vitals   01/15/20 1200  BP: (!) 106/58  Pulse: 89  Temp: (!) 97 F (36.1 C)  TempSrc: Temporal  SpO2: 96%  Weight: 191 lb 8 oz (86.9 kg)  Height: 5\' 9"  (1.753 m)   Body mass index is 28.28 kg/m.  Advanced Directives 01/15/2020 12/10/2019 10/30/2019 10/13/2019 10/07/2019 09/26/2019 09/25/2019  Does Patient Have a Medical Advance Directive? Yes Yes Yes Yes Yes - No  Type of Paramedic of [place];Living will [place];Living will [place];Living will [place];Living will Living will;Healthcare Power of Attorney - -  Does patient want to make changes to medical advance directive? No - Patient declined No - Patient declined No - Patient declined No - Patient declined No - Patient declined - No - Patient declined  Copy of [place] in Chart? No - copy requested No - copy requested No - copy requested No - copy requested No - copy requested - -  Would patient like information on creating a medical advance directive? - No - Patient declined No - Patient declined No - Patient declined No - Patient declined No - Patient declined -    Current Medications (verified) Outpatient Encounter Medications as of 01/15/2020  Medication Sig  . allopurinol (ZYLOPRIM) 100 MG tablet TAKE 2 TABLETS(200 MG) BY MOUTH DAILY (Patient taking differently: Take 200 mg by mouth daily with breakfast. )  . ALPRAZolam (XANAX) 0.5 MG tablet TAKE 1 TABLET(0.5 MG) BY MOUTH THREE TIMES DAILY AS NEEDED FOR ANXIETY  . amoxicillin (AMOXIL) 500 MG capsule Take 4 caps (2gm) by mouth once prior to dental procedure  . azelastine (ASTELIN) 0.1 % nasal spray Place 2  sprays into both nostrils 2 (two) times daily. Use in each nostril as directed (Patient taking differently: Place 2 sprays into both nostrils 2 (two) times daily as needed (for seasonal allergies). )  . b complex vitamins tablet Take 1 tablet by mouth daily.  . baclofen (LIORESAL) 10 MG tablet Take 1/2 to 1 by mouth every 8hrs as needed for spasm  . bethanechol (URECHOLINE) 25 MG tablet TAKE 1 TABLET(25 MG) BY MOUTH THREE TIMES DAILY (Patient taking differently: Take 25 mg by mouth 3 (three) times daily. )  . Calcium 500 MG tablet Take 500 mg by mouth daily.   . Cholecalciferol (VITAMIN D3) 2000 UNITS capsule Take 2,000 Units by mouth daily.  . Cyanocobalamin (VITAMIN B-12 PO) Take 1 tablet by mouth daily after breakfast.  . folic acid (FOLVITE) 1 MG tablet TAKE 2 TABLETS(2 MG) BY MOUTH DAILY  . furosemide (LASIX) 40 MG tablet Take 1 tablet (40 mg total) by mouth daily as needed for edema.  . gabapentin (NEURONTIN) 300 MG capsule TAKE 3 CAPSULES BY MOUTH THREE TIMES DAILY, PLUS 2 CAPSULES EVERY DAY AS NEEDED  . HYDROcodone-acetaminophen (NORCO) 7.5-325 MG tablet Take 1 tablet by mouth every 6 (six) hours as needed for moderate pain.  . Magnesium Oxide 200 MG TABS Take 200 mg by mouth daily.   . Melatonin 10 MG TABS Take 20 mg by mouth at bedtime.   . Multiple Vitamin (MULTIVITAMIN) tablet Take 1 tablet by mouth daily.   . Omega-3 Fatty Acids (OMEGA-3 FISH  OIL PO) Take 1 capsule by mouth daily. EPA/DHA 520/350   . Polyethyl Glyc-Propyl Glyc PF (SYSTANE ULTRA PF) 0.4-0.3 % SOLN Place 1-2 drops into both eyes 3 (three) times daily as needed (for dryness).   . polyethylene glycol (MIRALAX / GLYCOLAX) packet Take 17 g by mouth daily as needed (into 6-8 ounces of water and drink (in conjunction with Metamucil) once a day only when taking Norco).   . Potassium Chloride ER 20 MEQ TBCR Take 20 mEq by mouth daily.  . predniSONE (DELTASONE) 20 MG tablet May take 1 tablet daily for up to 3 days as needed for  gout attack or muscle spasm  . Psyllium (METAMUCIL PO) Take by mouth See admin instructions. Mix 1 teaspoonful into 6-8 ounces of water and drink (in conjunction with Miralax) once a day only when taking Norco  . Pyridoxine HCl (VITAMIN B-6 PO) Take 1 tablet by mouth daily after breakfast.  . vitamin C (ASCORBIC ACID) 500 MG tablet Take 1,000 mg by mouth daily.   Marland Kitchen zinc gluconate 50 MG tablet Take 50 mg by mouth daily.   Facility-Administered Encounter Medications as of 01/15/2020  Medication  . methylPREDNISolone acetate (DEPO-MEDROL) injection 80 mg    Allergies (verified) Nitroglycerin, Nsaids, Statins, Tolmetin, Ciprofloxacin, Ropinirole, and Requip [ropinirole hcl]   History: Past Medical History:  Diagnosis Date  . Acute exacerbation of CHF (congestive heart failure) ([place]) 09/25/2019  . Anemia   . Anemia due to antineoplastic chemotherapy 08/15/2015  . Anxiety   . Arthritis   . Cataract   . Chronic kidney disease (CKD), stage III (moderate) 02/09/2015   Controlled  . CMML (chronic myelomonocytic leukemia) ([place])    gets Aranesp about monthly, otherwise surveillance with Dr. Marin Olp  . Combined fat and carbohydrate induced hyperlipemia 02/09/2015   Controlled  . Depression   . DLBCL (diffuse large B cell lymphoma) ([place]) 08/15/2015  . Essential (primary) hypertension 02/09/2015  . Gastro-esophageal reflux disease without esophagitis 02/09/2015   Controlled  . Renal insufficiency   . Tinnitus    Past Surgical History:  Procedure Laterality Date  . CATARACT EXTRACTION, BILATERAL    . COLONOSCOPY  May 2011  . CYSTOSCOPY    . TOTAL HIP ARTHROPLASTY  2003   Right   . TOTAL HIP ARTHROPLASTY  April, 2011   Left  . TRIGGER FINGER RELEASE  Nov 2013  . TURBINECTOMY  2006  . UPPER GI ENDOSCOPY  Nov 2015   Family History  Problem Relation Age of Onset  . Anuerysm Mother 103       Deceased  . Colon cancer Father 52       Deceased  . Colon cancer Brother   . Prostate cancer  Brother        Deceased  . Alzheimer's disease Brother   . Heart disease Brother   . Alzheimer's disease Paternal Grandmother   . Early death Maternal Grandmother        Unknown  . Obesity Son   . Obesity Son        Gastric Bypass  . Healthy Son   . Healthy Daughter   . Diabetes Neg Hx    Social History   Socioeconomic History  . Marital status: Widowed    Spouse name: Not on file  . Number of children: Not on file  . Years of education: Not on file  . Highest education level: Not on file  Occupational History  . Not on file  Tobacco Use  .  Smoking status: Former Research scientist (life sciences)  . Smokeless tobacco: Never Used  . Tobacco comment: Quit >50 years ago  Vaping Use  . Vaping Use: Never used  Substance and Sexual Activity  . Alcohol use: Yes    Alcohol/week: 1.0 - 4.0 standard drink    Types: 1 - 4 Shots of liquor per week    Comment: 4 glasses per week  . Drug use: No  . Sexual activity: Not on file  Other Topics Concern  . Not on file  Social History Narrative  . Not on file   Social Determinants of Health   Financial Resource Strain: Low Risk   . Difficulty of Paying Living Expenses: Not hard at all  Food Insecurity: No Food Insecurity  . Worried About Charity fundraiser in the Last Year: Never true  . Ran Out of Food in the Last Year: Never true  Transportation Needs: No Transportation Needs  . Lack of Transportation (Medical): No  . Lack of Transportation (Non-Medical): No  Physical Activity:   . Days of Exercise per Week:   . Minutes of Exercise per Session:   Stress:   . Feeling of Stress :   Social Connections:   . Frequency of Communication with Friends and Family:   . Frequency of Social Gatherings with Friends and Family:   . Attends Religious Services:   . Active Member of Clubs or Organizations:   . Attends Archivist Meetings:   Marland Kitchen Marital Status:     Tobacco Counseling Counseling given: Not Answered Comment: Quit >50 years  ago   Clinical Intake:     Pain : No/denies pain     Activities of Daily Living In your present state of health, do you have any difficulty performing the following activities: 01/15/2020 09/25/2019  Hearing? Tempie Donning  Comment wears hearing aids. -  Vision? N N  Difficulty concentrating or making decisions? Y N  Walking or climbing stairs? Y Y  Dressing or bathing? N N  Doing errands, shopping? N N  Preparing Food and eating ? Y -  Using the Toilet? N -  In the past six months, have you accidently leaked urine? N -  Do you have problems with loss of bowel control? N -  Managing your Medications? N -  Managing your Finances? Y -  Housekeeping or managing your Housekeeping? Y -  Some recent data might be hidden    Patient Care Team: Copland, Gay Filler, MD as PCP - General (Family Medicine) Donato Heinz, MD as PCP - Cardiology (Cardiology) [name], MD as Consulting Physician (Oncology) Garald Balding, MD as Consulting Physician (Orthopedic Surgery) Alfonse Ras May, MD as Consulting Physician (Audiology) Deterding, Jeneen Rinks, MD as Consulting Physician (Nephrology) Sheryn Bison, MD as Consulting Physician (Dermatology) Beshears, Dorie Rank, DMD as Consulting Physician (Dentistry) Magnus Sinning, MD as Consulting Physician (Physical Medicine and Rehabilitation) [place], Romilda Garret, DPM as Consulting Physician (Podiatry)  Indicate any recent Medical Services you may have received from other than [facility] providers in the past year (date may be approximate).     Assessment:   This is a routine wellness examination for [place].  Dietary issues and exercise activities discussed: Current Exercise Habits: The patient does not participate in regular exercise at present, Exercise limited by: neurologic condition(s);orthopedic condition(s) Diet (meal preparation, eat out, water intake, caffeinated beverages, dairy products, fruits and vegetables): well balanced   Goals     . Lose 1 lb per month    .  Maintain healthy active lifestyle.      Depression Screen PHQ 2/9 Scores 01/15/2020 09/20/2017 06/04/2016 05/30/2016 05/21/2015  PHQ - 2 Score 0 0 0 0 0    Fall Risk Fall Risk  01/15/2020 09/20/2017 06/04/2016 05/30/2016 05/15/2016  Falls in the past year? 0 No No No No  Number falls in past yr: 0 - - - -  Injury with Fall? 0 - - - -  Risk for fall due to : Impaired balance/gait;Impaired mobility - - - -  Follow up Education provided;Falls prevention discussed - - - -   Lives at [facility]. Any stairs in or around the home? No  If so, are there any without handrails? No  Home free of loose throw rugs in walkways, pet beds, electrical cords, etc? Yes  Adequate lighting in your home to reduce risk of falls? Yes   ASSISTIVE DEVICES UTILIZED TO PREVENT FALLS:  Life alert? No  Use of a cane, walker or w/c? Yes  Grab bars in the bathroom? Yes  Shower chair or bench in shower? Yes  Elevated toilet seat or a handicapped toilet? Yes   TIMED UP AND GO:  Was the test performed? No .    Gait slow and steady with assistive device  Cognitive Function: MMSE - Mini Mental State Exam 06/04/2016  Orientation to time 5  Orientation to Place 5  Registration 3  Attention/ Calculation 4  Recall 1  Language- name 2 objects 2  Language- repeat 1  Language- follow 3 step command 3  Language- read & follow direction 1  Write a sentence 1  Copy design 1  Total score 27     6CIT Screen 01/15/2020  What Year? 0 points  What month? 0 points  What time? 0 points  Count back from 20 0 points  Months in reverse 0 points  Repeat phrase 6 points  Total Score 6    Immunizations Immunization History  Administered Date(s) Administered  . Influenza-Unspecified 04/04/2015, 03/15/2016, 04/10/2017, 03/26/2018  . Pneumococcal Conjugate-13 01/05/2014  . Pneumococcal Polysaccharide-23 12/23/2005, 06/25/2008  . Tdap 10/24/2010, 04/26/2011  . Zoster 07/14/2006,  06/25/2008    TDAP status: Up to date Pneumococcal vaccine status: Up to date Covid-19 vaccine status: Completed vaccines per pt. Dates not on hand.   Screening Tests Health Maintenance  Topic Date Due  . COVID-19 Vaccine (1) Never done  . INFLUENZA VACCINE  01/24/2020  . TETANUS/TDAP  04/25/2021  . PNA vac Low Risk Adult  Completed    Health Maintenance  Health Maintenance Due  Topic Date Due  . COVID-19 Vaccine (1) Never done    Colorectal cancer screening: No longer required.   Lung Cancer Screening: (Low Dose CT Chest recommended if Age 58-80 years, 30 pack-year currently smoking OR have quit w/in 15years.) does not qualify.    Additional Screening:   Vision Screening: Recommended annual ophthalmology exams for early detection of glaucoma and other disorders of the eye. Is the patient up to date with their annual eye exam?  Yes  Who is the provider or what is the name of the office in which the patient attends annual eye exams? Dr.Weaver.  Dental Screening: Recommended annual dental exams for proper oral hygiene  Community Resource Referral / Chronic Care Management: CRR required this visit?  No   CCM required this visit?  No      Plan:    Please schedule your next medicare wellness visit with me in 1 yr.  Continue to eat  heart healthy diet (full of fruits, vegetables, whole grains, lean protein, water--limit salt, fat, and sugar intake) and increase physical activity as tolerated.  Continue doing brain stimulating activities (puzzles, reading, adult coloring books, staying active) to keep memory sharp.   Bring a copy of your living will and/or healthcare power of attorney to your next office visit.   I have personally reviewed and noted the following in the patient's chart:   . Medical and social history . Use of alcohol, tobacco or illicit drugs  . Current medications and supplements . Functional ability and status . Nutritional status . Physical  activity . Advanced directives . List of other physicians . Hospitalizations, surgeries, and ER visits in previous 12 months . Vitals . Screenings to include cognitive, depression, and falls . Referrals and appointments  In addition, I have reviewed and discussed with patient certain preventive protocols, quality metrics, and best practice recommendations. A written personalized care plan for preventive services as well as general preventive health recommendations were provided to patient.     Naaman Plummer [place], [place]   01/15/2020

## 2020-01-15 ENCOUNTER — Encounter: Payer: Self-pay | Admitting: *Deleted

## 2020-01-15 ENCOUNTER — Ambulatory Visit (INDEPENDENT_AMBULATORY_CARE_PROVIDER_SITE_OTHER): Payer: Medicare Other | Admitting: *Deleted

## 2020-01-15 ENCOUNTER — Other Ambulatory Visit: Payer: Self-pay

## 2020-01-15 VITALS — BP 106/58 | HR 89 | Temp 97.0°F | Ht 69.0 in | Wt 191.5 lb

## 2020-01-15 DIAGNOSIS — Z0000 Encounter for general adult medical examination without abnormal findings: Secondary | ICD-10-CM | POA: Diagnosis not present

## 2020-01-15 NOTE — Patient Instructions (Signed)
Please schedule your next medicare wellness visit with me in 1 yr.  Continue to eat heart healthy diet (full of fruits, vegetables, whole grains, lean protein, water--limit salt, fat, and sugar intake) and increase physical activity as tolerated.  Continue doing brain stimulating activities (puzzles, reading, adult coloring books, staying active) to keep memory sharp.   Bring a copy of your living will and/or healthcare power of attorney to your next office visit.   Jeremiah Walker , Thank you for taking time to come for your Medicare Wellness Visit. I appreciate your ongoing commitment to your health goals. Please review the following plan we discussed and let me know if I can assist you in the future.   These are the goals we discussed: Goals    . Lose 1 lb per month    . Maintain healthy active lifestyle.       This is a list of the screening recommended for you and due dates:  Health Maintenance  Topic Date Due  . COVID-19 Vaccine (1) Never done  . Flu Shot  01/24/2020  . Tetanus Vaccine  04/25/2021  . Pneumonia vaccines  Completed    Preventive Care 66 Years and Older, Male Preventive care refers to lifestyle choices and visits with your health care provider that can promote health and wellness. This includes:  A yearly physical exam. This is also called an annual well check.  Regular dental and eye exams.  Immunizations.  Screening for certain conditions.  Healthy lifestyle choices, such as diet and exercise. What can I expect for my preventive care visit? Physical exam Your health care provider will check:  Height and weight. These may be used to calculate body mass index (BMI), which is a measurement that tells if you are at a healthy weight.  Heart rate and blood pressure.  Your skin for abnormal spots. Counseling Your health care provider may ask you questions about:  Alcohol, tobacco, and drug use.  Emotional well-being.  Home and relationship  well-being.  Sexual activity.  Eating habits.  History of falls.  Memory and ability to understand (cognition).  Work and work Statistician. What immunizations do I need?  Influenza (flu) vaccine  This is recommended every year. Tetanus, diphtheria, and pertussis (Tdap) vaccine  You may need a Td booster every 10 years. Varicella (chickenpox) vaccine  You may need this vaccine if you have not already been vaccinated. Zoster (shingles) vaccine  You may need this after age 48. Pneumococcal conjugate (PCV13) vaccine  One dose is recommended after age 74. Pneumococcal polysaccharide (PPSV23) vaccine  One dose is recommended after age 68. Measles, mumps, and rubella (MMR) vaccine  You may need at least one dose of MMR if you were born in 1957 or later. You may also need a second dose. Meningococcal conjugate (MenACWY) vaccine  You may need this if you have certain conditions. Hepatitis A vaccine  You may need this if you have certain conditions or if you travel or work in places where you may be exposed to hepatitis A. Hepatitis B vaccine  You may need this if you have certain conditions or if you travel or work in places where you may be exposed to hepatitis B. Haemophilus influenzae type b (Hib) vaccine  You may need this if you have certain conditions. You may receive vaccines as individual doses or as more than one vaccine together in one shot (combination vaccines). Talk with your health care provider about the risks and benefits of combination vaccines.  What tests do I need? Blood tests  Lipid and cholesterol levels. These may be checked every 5 years, or more frequently depending on your overall health.  Hepatitis C test.  Hepatitis B test. Screening  Lung cancer screening. You may have this screening every year starting at age 3 if you have a 30-pack-year history of smoking and currently smoke or have quit within the past 15 years.  Colorectal cancer  screening. All adults should have this screening starting at age 17 and continuing until age 73. Your health care provider may recommend screening at age 81 if you are at increased risk. You will have tests every 1-10 years, depending on your results and the type of screening test.  Prostate cancer screening. Recommendations will vary depending on your family history and other risks.  Diabetes screening. This is done by checking your blood sugar (glucose) after you have not eaten for a while (fasting). You may have this done every 1-3 years.  Abdominal aortic aneurysm (AAA) screening. You may need this if you are a current or former smoker.  Sexually transmitted disease (STD) testing. Follow these instructions at home: Eating and drinking  Eat a diet that includes fresh fruits and vegetables, whole grains, lean protein, and low-fat dairy products. Limit your intake of foods with high amounts of sugar, saturated fats, and salt.  Take vitamin and mineral supplements as recommended by your health care provider.  Do not drink alcohol if your health care provider tells you not to drink.  If you drink alcohol: ? Limit how much you have to 0-2 drinks a day. ? Be aware of how much alcohol is in your drink. In the [place] one drink equals one 12 oz bottle of beer (355 mL), one 5 oz glass of wine (148 mL), or one 1 oz glass of hard liquor (44 mL). Lifestyle  Take daily care of your teeth and gums.  Stay active. Exercise for at least 30 minutes on 5 or more days each week.  Do not use any products that contain nicotine or tobacco, such as cigarettes, e-cigarettes, and chewing tobacco. If you need help quitting, ask your health care provider.  If you are sexually active, practice safe sex. Use a condom or other form of protection to prevent STIs (sexually transmitted infections).  Talk with your health care provider about taking a low-dose aspirin or statin. What's next?  Visit your health care  provider once a year for a well check visit.  Ask your health care provider how often you should have your eyes and teeth checked.  Stay up to date on all vaccines. This information is not intended to replace advice given to you by your health care provider. Make sure you discuss any questions you have with your health care provider. Document Revised: 06/05/2018 Document Reviewed: 06/05/2018 Elsevier Patient Education  2020 [employer].

## 2020-01-19 ENCOUNTER — Inpatient Hospital Stay: Payer: Medicare Other

## 2020-01-19 ENCOUNTER — Inpatient Hospital Stay (HOSPITAL_BASED_OUTPATIENT_CLINIC_OR_DEPARTMENT_OTHER): Payer: Medicare Other | Admitting: Family

## 2020-01-19 ENCOUNTER — Encounter: Payer: Self-pay | Admitting: Family

## 2020-01-19 ENCOUNTER — Other Ambulatory Visit: Payer: Self-pay

## 2020-01-19 VITALS — BP 105/50 | HR 85 | Temp 98.2°F | Resp 18 | Wt 196.4 lb

## 2020-01-19 DIAGNOSIS — D6481 Anemia due to antineoplastic chemotherapy: Secondary | ICD-10-CM

## 2020-01-19 DIAGNOSIS — D649 Anemia, unspecified: Secondary | ICD-10-CM

## 2020-01-19 DIAGNOSIS — C9310 Chronic myelomonocytic leukemia not having achieved remission: Secondary | ICD-10-CM

## 2020-01-19 DIAGNOSIS — T451X5A Adverse effect of antineoplastic and immunosuppressive drugs, initial encounter: Secondary | ICD-10-CM

## 2020-01-19 DIAGNOSIS — R5383 Other fatigue: Secondary | ICD-10-CM | POA: Diagnosis not present

## 2020-01-19 DIAGNOSIS — C8334 Diffuse large B-cell lymphoma, lymph nodes of axilla and upper limb: Secondary | ICD-10-CM

## 2020-01-19 DIAGNOSIS — D508 Other iron deficiency anemias: Secondary | ICD-10-CM

## 2020-01-19 DIAGNOSIS — R0602 Shortness of breath: Secondary | ICD-10-CM | POA: Diagnosis not present

## 2020-01-19 DIAGNOSIS — G629 Polyneuropathy, unspecified: Secondary | ICD-10-CM | POA: Diagnosis not present

## 2020-01-19 DIAGNOSIS — R531 Weakness: Secondary | ICD-10-CM | POA: Diagnosis not present

## 2020-01-19 LAB — CMP (CANCER CENTER ONLY)
ALT: 8 U/L (ref 0–44)
AST: 11 U/L — ABNORMAL LOW (ref 15–41)
Albumin: 4 g/dL (ref 3.5–5.0)
Alkaline Phosphatase: 58 U/L (ref 38–126)
Anion gap: 10 (ref 5–15)
BUN: 33 mg/dL — ABNORMAL HIGH (ref 8–23)
CO2: 26 mmol/L (ref 22–32)
Calcium: 9.9 mg/dL (ref 8.9–10.3)
Chloride: 104 mmol/L (ref 98–111)
Creatinine: 1.83 mg/dL — ABNORMAL HIGH (ref 0.61–1.24)
GFR, Est AFR Am: 37 mL/min — ABNORMAL LOW (ref >60)
GFR, Estimated: 32 mL/min — ABNORMAL LOW (ref >60)
Glucose, Bld: 127 mg/dL — ABNORMAL HIGH (ref 70–99)
Potassium: 4.4 mmol/L (ref 3.5–5.1)
Sodium: 140 mmol/L (ref 135–145)
Total Bilirubin: 1.3 mg/dL — ABNORMAL HIGH (ref 0.3–1.2)
Total Protein: 7 g/dL (ref 6.5–8.1)

## 2020-01-19 LAB — RETICULOCYTES
Immature Retic Fract: 27.1 % — ABNORMAL HIGH (ref 2.3–15.9)
RBC.: 2 MIL/uL — ABNORMAL LOW (ref 4.22–5.81)
Retic Count, Absolute: 59.4 10*3/uL (ref 19.0–186.0)
Retic Ct Pct: 3 % (ref 0.4–3.1)

## 2020-01-19 LAB — CBC WITH DIFFERENTIAL (CANCER CENTER ONLY)
Abs Immature Granulocytes: 0.12 10*3/uL — ABNORMAL HIGH (ref 0.00–0.07)
Basophils Absolute: 0 10*3/uL (ref 0.0–0.1)
Basophils Relative: 1 %
Eosinophils Absolute: 0.1 10*3/uL (ref 0.0–0.5)
Eosinophils Relative: 1 %
HCT: 24.3 % — ABNORMAL LOW (ref 39.0–52.0)
Hemoglobin: 7.7 g/dL — ABNORMAL LOW (ref 13.0–17.0)
Immature Granulocytes: 2 %
Lymphocytes Relative: 24 %
Lymphs Abs: 1.4 10*3/uL (ref 0.7–4.0)
MCH: 38.7 pg — ABNORMAL HIGH (ref 26.0–34.0)
MCHC: 31.7 g/dL (ref 30.0–36.0)
MCV: 122.1 fL — ABNORMAL HIGH (ref 80.0–100.0)
Monocytes Absolute: 2.2 10*3/uL — ABNORMAL HIGH (ref 0.1–1.0)
Monocytes Relative: 39 %
Neutro Abs: 1.8 10*3/uL (ref 1.7–7.7)
Neutrophils Relative %: 33 %
Platelet Count: 72 10*3/uL — ABNORMAL LOW (ref 150–400)
RBC: 1.99 MIL/uL — ABNORMAL LOW (ref 4.22–5.81)
RDW: 18.6 % — ABNORMAL HIGH (ref 11.5–15.5)
WBC Count: 5.6 10*3/uL (ref 4.0–10.5)
nRBC: 0.4 % — ABNORMAL HIGH (ref 0.0–0.2)

## 2020-01-19 LAB — PREPARE RBC (CROSSMATCH)

## 2020-01-19 LAB — SAVE SMEAR(SSMR), FOR PROVIDER SLIDE REVIEW

## 2020-01-19 MED ORDER — DARBEPOETIN ALFA 500 MCG/ML IJ SOSY
500.0000 ug | PREFILLED_SYRINGE | Freq: Once | INTRAMUSCULAR | Status: AC
  Administered 2020-01-19: 500 ug via SUBCUTANEOUS

## 2020-01-19 MED ORDER — DARBEPOETIN ALFA 500 MCG/ML IJ SOSY
PREFILLED_SYRINGE | INTRAMUSCULAR | Status: AC
  Filled 2020-01-19: qty 1

## 2020-01-19 NOTE — Patient Instructions (Signed)
Darbepoetin Alfa injection What is this medicine? DARBEPOETIN ALFA (dar be POE e tin AL fa) helps your body make more red blood cells. It is used to treat anemia caused by chronic kidney failure and chemotherapy. This medicine may be used for other purposes; ask your health care provider or pharmacist if you have questions. COMMON BRAND NAME(S): Aranesp What should I tell my health care provider before I take this medicine? They need to know if you have any of these conditions:  blood clotting disorders or history of blood clots  cancer patient not on chemotherapy  cystic fibrosis  heart disease, such as angina, heart failure, or a history of a heart attack  hemoglobin level of 12 g/dL or greater  high blood pressure  low levels of folate, iron, or vitamin B12  seizures  an unusual or allergic reaction to darbepoetin, erythropoietin, albumin, hamster proteins, latex, other medicines, foods, dyes, or preservatives  pregnant or trying to get pregnant  breast-feeding How should I use this medicine? This medicine is for injection into a vein or under the skin. It is usually given by a health care professional in a hospital or clinic setting. If you get this medicine at home, you will be taught how to prepare and give this medicine. Use exactly as directed. Take your medicine at regular intervals. Do not take your medicine more often than directed. It is important that you put your used needles and syringes in a special sharps container. Do not put them in a trash can. If you do not have a sharps container, call your pharmacist or healthcare provider to get one. A special MedGuide will be given to you by the pharmacist with each prescription and refill. Be sure to read this information carefully each time. Talk to your pediatrician regarding the use of this medicine in children. While this medicine may be used in children as young as 1 month of age for selected conditions, precautions do  apply. Overdosage: If you think you have taken too much of this medicine contact a poison control center or emergency room at once. NOTE: This medicine is only for you. Do not share this medicine with others. What if I miss a dose? If you miss a dose, take it as soon as you can. If it is almost time for your next dose, take only that dose. Do not take double or extra doses. What may interact with this medicine? Do not take this medicine with any of the following medications:  epoetin alfa This list may not describe all possible interactions. Give your health care provider a list of all the medicines, herbs, non-prescription drugs, or dietary supplements you use. Also tell them if you smoke, drink alcohol, or use illegal drugs. Some items may interact with your medicine. What should I watch for while using this medicine? Your condition will be monitored carefully while you are receiving this medicine. You may need blood work done while you are taking this medicine. This medicine may cause a decrease in vitamin B6. You should make sure that you get enough vitamin B6 while you are taking this medicine. Discuss the foods you eat and the vitamins you take with your health care professional. What side effects may I notice from receiving this medicine? Side effects that you should report to your doctor or health care professional as soon as possible:  allergic reactions like skin rash, itching or hives, swelling of the face, lips, or tongue  breathing problems  changes in   vision  chest pain  confusion, trouble speaking or understanding  feeling faint or lightheaded, falls  high blood pressure  muscle aches or pains  pain, swelling, warmth in the leg  rapid weight gain  severe headaches  sudden numbness or weakness of the face, arm or leg  trouble walking, dizziness, loss of balance or coordination  seizures (convulsions)  swelling of the ankles, feet, hands  unusually weak or  tired Side effects that usually do not require medical attention (report to your doctor or health care professional if they continue or are bothersome):  diarrhea  fever, chills (flu-like symptoms)  headaches  nausea, vomiting  redness, stinging, or swelling at site where injected This list may not describe all possible side effects. Call your doctor for medical advice about side effects. You may report side effects to FDA at 1-800-FDA-1088. Where should I keep my medicine? Keep out of the reach of children. Store in a refrigerator between 2 and 8 degrees C (36 and 46 degrees F). Do not freeze. Do not shake. Throw away any unused portion if using a single-dose vial. Throw away any unused medicine after the expiration date. NOTE: This sheet is a summary. It may not cover all possible information. If you have questions about this medicine, talk to your doctor, pharmacist, or health care provider.  2020 Elsevier/Gold Standard (2017-06-26 16:44:20)  

## 2020-01-19 NOTE — Progress Notes (Signed)
Hematology and Oncology Follow Up Visit  DOY TAAFFE [id number] 11-02-1927 86 y.o. 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 Yaffe is here today for follow-up and Aranesp injection. He is symptomatic with fatigued, weakness and is pale.  He states that he only notes SOB with increased fluid. He weighs himself daily and states that he takes his lasix as prescribed.  He has not noted any blood loss. No bruising or petechiae.  Hgb is 7.7, MCV 122 and platelets 72.  No fever, chills, n/v, cough, rash, dizziness, chest pain, palpitations, abdominal pain or changes in bowel or bladder habits.  No swelling, tenderness in his extremities. 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  No falls or syncopal episodes to report. He ambulates with a cane for added support.  He states that his appetite is ok and he is hydrating. His weight is stable.   ECOG Performance Status: 1 - Symptomatic but completely ambulatory  Medications:  Allergies as of 01/19/2020      Reactions   Nitroglycerin Other (See Comments)   "I ended up in a fetal position from the pain"   Nsaids Other (See Comments), Tinitus   Bruising   Statins Nausea And Vomiting, Other (See Comments)   Kidney issues and muscle pain   Tolmetin Other (See Comments)   Bruising   Ciprofloxacin Other (See Comments)   Achilles tendon issues   Ropinirole Anxiety, Other (See Comments)   Mood swings, also   Requip [ropinirole Hcl] Anxiety      Medication List       Accurate as of January 19, 2020  1:28 PM. If you have any questions, ask your nurse or doctor.        allopurinol 100 MG tablet Commonly known as: ZYLOPRIM TAKE 2 TABLETS(200 MG) BY MOUTH DAILY What  changed:   how much to take  how to take this  when to take this  additional instructions   ALPRAZolam 0.5 MG tablet Commonly known as: XANAX TAKE 1 TABLET(0.5 MG) BY MOUTH THREE TIMES DAILY AS NEEDED FOR ANXIETY   amoxicillin 500 MG capsule Commonly known as: AMOXIL Take 4 caps (2gm) by mouth once prior to dental procedure   azelastine 0.1 % nasal spray Commonly known as: ASTELIN Place 2 sprays into both nostrils 2 (two) times daily. Use in each nostril as directed What changed:   when to take this  reasons to take this  additional instructions   b complex vitamins tablet Take 1 tablet by mouth daily.   baclofen 10 MG tablet Commonly known as: LIORESAL Take 1/2 to 1 by mouth every 8hrs as needed for spasm   bethanechol 25 MG tablet Commonly known as: URECHOLINE TAKE 1 TABLET(25 MG) BY MOUTH THREE TIMES DAILY What changed:   how much to take  how to take this  when to take this  additional instructions   Calcium 500 MG tablet Take 500 mg by mouth daily.   folic acid 1 MG tablet Commonly known as: FOLVITE TAKE 2 TABLETS(2 MG) BY MOUTH DAILY   furosemide 40 MG tablet Commonly known as: Lasix Take 1 tablet (40 mg total) by mouth daily as needed for edema.  gabapentin 300 MG capsule Commonly known as: NEURONTIN TAKE 3 CAPSULES BY MOUTH THREE TIMES DAILY, PLUS 2 CAPSULES EVERY DAY AS NEEDED   HYDROcodone-acetaminophen 7.5-325 MG tablet Commonly known as: NORCO Take 1 tablet by mouth every 6 (six) hours as needed for moderate pain.   Magnesium Oxide 200 MG Tabs Take 200 mg by mouth daily.   Melatonin 10 MG Tabs Take 20 mg by mouth at bedtime.   METAMUCIL PO Take by mouth See admin instructions. Mix 1 teaspoonful into 6-8 ounces of water and drink (in conjunction with Miralax) once a day only when taking Norco   multivitamin tablet Take 1 tablet by mouth daily.   OMEGA-3 FISH OIL PO Take 1 capsule by mouth daily. EPA/DHA 520/350     polyethylene glycol 17 g packet Commonly known as: MIRALAX / GLYCOLAX Take 17 g by mouth daily as needed (into 6-8 ounces of water and drink (in conjunction with Metamucil) once a day only when taking Norco).   Potassium Chloride ER 20 MEQ Tbcr Take 20 mEq by mouth daily.   predniSONE 20 MG tablet Commonly known as: DELTASONE May take 1 tablet daily for up to 3 days as needed for gout attack or muscle spasm   Systane Ultra PF 0.4-0.3 % Soln Generic drug: Polyethyl Glyc-Propyl Glyc PF Place 1-2 drops into both eyes 3 (three) times daily as needed (for dryness).   VITAMIN B-12 PO Take 1 tablet by mouth daily after breakfast.   VITAMIN B-6 PO Take 1 tablet by mouth daily after breakfast.   vitamin C 500 MG tablet Commonly known as: ASCORBIC ACID Take 1,000 mg by mouth daily.   Vitamin D3 50 MCG (2000 UT) capsule Take 2,000 Units by mouth daily.   zinc gluconate 50 MG tablet Take 50 mg by mouth daily.       Allergies:  Allergies  Allergen Reactions   Nitroglycerin Other (See Comments)    "I ended up in a fetal position from the pain"   Nsaids Other (See Comments) and Tinitus    Bruising    Statins Nausea And Vomiting and Other (See Comments)    Kidney issues and muscle pain    Tolmetin Other (See Comments)    Bruising   Ciprofloxacin Other (See Comments)    Achilles tendon issues   Ropinirole Anxiety and Other (See Comments)    Mood swings, also   Requip [Ropinirole Hcl] Anxiety    Past Medical History, Surgical history, Social history, and Family History were reviewed and updated.  Review of Systems: All other 10 point review of systems is negative.   Physical Exam:  vitals were not taken for this visit.   Wt Readings from Last 3 Encounters:  01/15/20 191 lb 8 oz (86.9 kg)  01/13/20 196 lb (88.9 kg)  12/10/19 197 lb 12.8 oz (89.7 kg)    Ocular: Sclerae unicteric, pupils equal, round and reactive to light Ear-nose-throat: Oropharynx clear,  dentition fair Lymphatic: No cervical or supraclavicular adenopathy Lungs no rales or rhonchi, good excursion bilaterally Heart regular rate and rhythm, no murmur appreciated Abd soft, nontender, positive bowel sounds, no liver or spleen tip palpated on exam, no fluid wave  MSK no focal spinal tenderness, no joint edema Neuro: non-focal, well-oriented, appropriate affect Breasts: Deferred   Lab Results  Component Value Date   WBC 8.9 12/31/2019   HGB 7.8 (L) 12/31/2019   HCT 24.9 (L) 12/31/2019   MCV 120.3 (H) 12/31/2019   PLT 92 (L) 12/31/2019  Lab Results  Component Value Date   FERRITIN 431 (H) 12/10/2019   IRON 64 12/10/2019   TIBC 258 12/10/2019   UIBC 194 12/10/2019   IRONPCTSAT 25 12/10/2019   Lab Results  Component Value Date   RETICCTPCT 2.6 12/10/2019   RBC 2.07 (L) 12/31/2019   No results found for: KPAFRELGTCHN, LAMBDASER, KAPLAMBRATIO No results found for: IGGSERUM, IGA, IGMSERUM No results found for: Kathrynn Ducking, MSPIKE, SPEI   Chemistry      Component Value Date/Time   NA 143 01/13/2020 1207   NA 139 05/08/2017 1123   NA 141 05/15/2016 1126   K 4.5 01/13/2020 1207   K 4.0 05/08/2017 1123   K 4.3 05/15/2016 1126   CL 102 01/13/2020 1207   CL 105 05/08/2017 1123   CO2 23 01/13/2020 1207   CO2 30 05/08/2017 1123   CO2 26 05/15/2016 1126   BUN 26 01/13/2020 1207   BUN 15 05/08/2017 1123   BUN 29.1 (H) 05/15/2016 1126   CREATININE 1.90 (H) 01/13/2020 1207   CREATININE 1.94 (H) 12/31/2019 1352   CREATININE 1.2 05/08/2017 1123   CREATININE 1.7 (H) 05/15/2016 1126   GLU 90 11/15/2014 0000      Component Value Date/Time   CALCIUM 9.3 01/13/2020 1207   CALCIUM 9.1 05/08/2017 1123   CALCIUM 9.7 05/15/2016 1126   ALKPHOS 51 12/31/2019 1352   ALKPHOS 64 05/08/2017 1123   ALKPHOS 69 05/15/2016 1126   AST 11 (L) 12/31/2019 1352   AST 23 05/15/2016 1126   ALT 9 12/31/2019 1352   ALT 19 05/08/2017 1123    ALT 22 05/15/2016 1126   BILITOT 1.3 (H) 12/31/2019 1352   BILITOT 0.84 05/15/2016 1126       Impression and Plan: Mr.Vincentis a 86yo caucasian gentleman withhistory of diffuse large cell non-Hodgkin's lymphomatreated with6 cycles of R-CHOP completed in April 2014.  He now has CMMoL possibly residual from the chemotherapyhe received for lymphoma. He received Aranesp today and we will have him come back in tomorrow to get 1 unit of blood.  He is in agreement with the plan and can contact our office with any questions or concerns.  We will plan to see him again in another 3 weeks.   Laverna Peace, NP 7/27/20211:28 PM

## 2020-01-20 ENCOUNTER — Inpatient Hospital Stay: Payer: Medicare Other

## 2020-01-20 DIAGNOSIS — C9310 Chronic myelomonocytic leukemia not having achieved remission: Secondary | ICD-10-CM | POA: Diagnosis not present

## 2020-01-20 DIAGNOSIS — R0602 Shortness of breath: Secondary | ICD-10-CM | POA: Diagnosis not present

## 2020-01-20 DIAGNOSIS — D6481 Anemia due to antineoplastic chemotherapy: Secondary | ICD-10-CM | POA: Diagnosis not present

## 2020-01-20 DIAGNOSIS — R5383 Other fatigue: Secondary | ICD-10-CM | POA: Diagnosis not present

## 2020-01-20 DIAGNOSIS — D649 Anemia, unspecified: Secondary | ICD-10-CM

## 2020-01-20 DIAGNOSIS — G629 Polyneuropathy, unspecified: Secondary | ICD-10-CM | POA: Diagnosis not present

## 2020-01-20 DIAGNOSIS — R531 Weakness: Secondary | ICD-10-CM | POA: Diagnosis not present

## 2020-01-20 LAB — FERRITIN: Ferritin: 356 ng/mL — ABNORMAL HIGH (ref 24–336)

## 2020-01-20 LAB — IRON AND TIBC
Iron: 109 ug/dL (ref 42–163)
Saturation Ratios: 45 % (ref 20–55)
TIBC: 243 ug/dL (ref 202–409)
UIBC: 134 ug/dL (ref 117–376)

## 2020-01-20 LAB — LACTATE DEHYDROGENASE: LDH: 208 U/L — ABNORMAL HIGH (ref 98–192)

## 2020-01-20 LAB — ABO/RH: ABO/RH(D): O POS

## 2020-01-20 MED ORDER — ACETAMINOPHEN 325 MG PO TABS
650.0000 mg | ORAL_TABLET | Freq: Once | ORAL | Status: AC
  Administered 2020-01-20: 650 mg via ORAL

## 2020-01-20 MED ORDER — ACETAMINOPHEN 325 MG PO TABS
ORAL_TABLET | ORAL | Status: AC
  Filled 2020-01-20: qty 2

## 2020-01-20 MED ORDER — DIPHENHYDRAMINE HCL 25 MG PO CAPS
ORAL_CAPSULE | ORAL | Status: AC
  Filled 2020-01-20: qty 1

## 2020-01-20 MED ORDER — SODIUM CHLORIDE 0.9% IV SOLUTION
250.0000 mL | Freq: Once | INTRAVENOUS | Status: AC
  Administered 2020-01-20: 250 mL via INTRAVENOUS
  Filled 2020-01-20: qty 250

## 2020-01-20 MED ORDER — FUROSEMIDE 10 MG/ML IJ SOLN
INTRAMUSCULAR | Status: AC
  Filled 2020-01-20: qty 4

## 2020-01-20 MED ORDER — FUROSEMIDE 10 MG/ML IJ SOLN: 20.0000 mg | Freq: Once | INTRAMUSCULAR | Status: DC

## 2020-01-20 MED ORDER — DIPHENHYDRAMINE HCL 25 MG PO CAPS
25.0000 mg | ORAL_CAPSULE | Freq: Once | ORAL | Status: AC
  Administered 2020-01-20: 25 mg via ORAL

## 2020-01-20 NOTE — Progress Notes (Signed)
Lasix held per provider due to low pressure- BP 105/66

## 2020-01-21 ENCOUNTER — Other Ambulatory Visit: Payer: Self-pay | Admitting: Hematology & Oncology

## 2020-01-21 ENCOUNTER — Inpatient Hospital Stay: Payer: Medicare Other

## 2020-01-21 ENCOUNTER — Inpatient Hospital Stay: Payer: Medicare Other | Admitting: Family

## 2020-01-21 LAB — TYPE AND SCREEN
ABO/RH(D): O POS
Antibody Screen: NEGATIVE
Unit division: 0

## 2020-01-21 LAB — BPAM RBC
Blood Product Expiration Date: 202108292359
ISSUE DATE / TIME: 202107280859
Unit Type and Rh: 5100

## 2020-01-29 ENCOUNTER — Other Ambulatory Visit: Payer: Self-pay | Admitting: Family Medicine

## 2020-01-29 DIAGNOSIS — J302 Other seasonal allergic rhinitis: Secondary | ICD-10-CM

## 2020-02-01 NOTE — Procedures (Signed)
Lumbosacral Transforaminal Epidural Steroid Injection - Sub-Pedicular Approach with Fluoroscopic Guidance  Patient: Jeremiah Walker      Date of Birth: 1928/02/15 MRN: [id number] PCP: [name], MD      Visit Date: 01/06/2020   Universal Protocol:    Date/Time: 01/06/2020  Consent Given By: the patient  Position: PRONE  Additional Comments: Vital signs were monitored before and after the procedure. Patient was prepped and draped in the usual sterile fashion. The correct patient, procedure, and site was verified.   Injection Procedure Details:  Procedure Site One Meds Administered:  Meds ordered this encounter  Medications  . methylPREDNISolone acetate (DEPO-MEDROL) injection 80 mg    Laterality: Left  Location/Site:  L2-L3  Needle size: 22 G  Needle type: Spinal  Needle Placement: Transforaminal  Findings:    -Comments: Excellent flow of contrast along the nerve, nerve root and into the epidural space. Initial flow was intra-discal, then repositioned.  Procedure Details: After squaring off the end-plates to get a true AP view, the C-arm was positioned so that an oblique view of the foramen as noted above was visualized. The target area is just inferior to the "nose of the scotty dog" or sub pedicular. The soft tissues overlying this structure were infiltrated with 2-3 ml. of 1% Lidocaine without Epinephrine.  The spinal needle was inserted toward the target using a "trajectory" view along the fluoroscope beam.  Under AP and lateral visualization, the needle was advanced so it did not puncture dura and was located close the 6 O'Clock position of the pedical in AP tracterory. Biplanar projections were used to confirm position. Aspiration was confirmed to be negative for CSF and/or blood. A 1-2 ml. volume of Isovue-250 was injected and flow of contrast was noted at each level. Radiographs were obtained for documentation purposes.   After attaining the desired  flow of contrast documented above, a 0.5 to 1.0 ml test dose of 0.25% Marcaine was injected into each respective transforaminal space.  The patient was observed for 90 seconds post injection.  After no sensory deficits were reported, and normal lower extremity motor function was noted,   the above injectate was administered so that equal amounts of the injectate were placed at each foramen (level) into the transforaminal epidural space.   Additional Comments:  The patient tolerated the procedure well Dressing: 2 x 2 sterile gauze and Band-Aid    Post-procedure details: Patient was observed during the procedure. Post-procedure instructions were reviewed.  Patient left the clinic in stable condition.

## 2020-02-09 ENCOUNTER — Encounter: Payer: Self-pay | Admitting: Family

## 2020-02-09 ENCOUNTER — Other Ambulatory Visit: Payer: Self-pay | Admitting: *Deleted

## 2020-02-09 ENCOUNTER — Other Ambulatory Visit: Payer: Self-pay

## 2020-02-09 ENCOUNTER — Inpatient Hospital Stay: Payer: Medicare Other | Attending: Hematology & Oncology

## 2020-02-09 ENCOUNTER — Inpatient Hospital Stay (HOSPITAL_BASED_OUTPATIENT_CLINIC_OR_DEPARTMENT_OTHER): Payer: Medicare Other | Admitting: Family

## 2020-02-09 ENCOUNTER — Inpatient Hospital Stay: Payer: Medicare Other

## 2020-02-09 VITALS — BP 134/69 | HR 98 | Temp 97.6°F | Resp 20 | Ht 69.0 in | Wt 195.0 lb

## 2020-02-09 DIAGNOSIS — T451X5A Adverse effect of antineoplastic and immunosuppressive drugs, initial encounter: Secondary | ICD-10-CM | POA: Insufficient documentation

## 2020-02-09 DIAGNOSIS — Z881 Allergy status to other antibiotic agents status: Secondary | ICD-10-CM | POA: Diagnosis not present

## 2020-02-09 DIAGNOSIS — D6481 Anemia due to antineoplastic chemotherapy: Secondary | ICD-10-CM | POA: Insufficient documentation

## 2020-02-09 DIAGNOSIS — C9310 Chronic myelomonocytic leukemia not having achieved remission: Secondary | ICD-10-CM

## 2020-02-09 DIAGNOSIS — G629 Polyneuropathy, unspecified: Secondary | ICD-10-CM | POA: Diagnosis not present

## 2020-02-09 DIAGNOSIS — Z886 Allergy status to analgesic agent status: Secondary | ICD-10-CM | POA: Diagnosis not present

## 2020-02-09 DIAGNOSIS — Y92002 Bathroom of unspecified non-institutional (private) residence single-family (private) house as the place of occurrence of the external cause: Secondary | ICD-10-CM | POA: Insufficient documentation

## 2020-02-09 DIAGNOSIS — D649 Anemia, unspecified: Secondary | ICD-10-CM | POA: Diagnosis not present

## 2020-02-09 DIAGNOSIS — Z8572 Personal history of non-Hodgkin lymphomas: Secondary | ICD-10-CM | POA: Insufficient documentation

## 2020-02-09 DIAGNOSIS — R0602 Shortness of breath: Secondary | ICD-10-CM | POA: Diagnosis not present

## 2020-02-09 DIAGNOSIS — S50311A Abrasion of right elbow, initial encounter: Secondary | ICD-10-CM | POA: Insufficient documentation

## 2020-02-09 DIAGNOSIS — W19XXXA Unspecified fall, initial encounter: Secondary | ICD-10-CM | POA: Diagnosis not present

## 2020-02-09 DIAGNOSIS — R531 Weakness: Secondary | ICD-10-CM | POA: Insufficient documentation

## 2020-02-09 DIAGNOSIS — R5383 Other fatigue: Secondary | ICD-10-CM | POA: Insufficient documentation

## 2020-02-09 DIAGNOSIS — D508 Other iron deficiency anemias: Secondary | ICD-10-CM

## 2020-02-09 DIAGNOSIS — Z79899 Other long term (current) drug therapy: Secondary | ICD-10-CM | POA: Insufficient documentation

## 2020-02-09 DIAGNOSIS — Z888 Allergy status to other drugs, medicaments and biological substances status: Secondary | ICD-10-CM | POA: Diagnosis not present

## 2020-02-09 LAB — CMP (CANCER CENTER ONLY)
ALT: 10 U/L (ref 0–44)
AST: 14 U/L — ABNORMAL LOW (ref 15–41)
Albumin: 3.8 g/dL (ref 3.5–5.0)
Alkaline Phosphatase: 62 U/L (ref 38–126)
Anion gap: 11 (ref 5–15)
BUN: 32 mg/dL — ABNORMAL HIGH (ref 8–23)
CO2: 25 mmol/L (ref 22–32)
Calcium: 9.4 mg/dL (ref 8.9–10.3)
Chloride: 101 mmol/L (ref 98–111)
Creatinine: 1.85 mg/dL — ABNORMAL HIGH (ref 0.61–1.24)
GFR, Est AFR Am: 36 mL/min — ABNORMAL LOW (ref >60)
GFR, Estimated: 31 mL/min — ABNORMAL LOW (ref >60)
Glucose, Bld: 123 mg/dL — ABNORMAL HIGH (ref 70–99)
Potassium: 3.9 mmol/L (ref 3.5–5.1)
Sodium: 137 mmol/L (ref 135–145)
Total Bilirubin: 1.4 mg/dL — ABNORMAL HIGH (ref 0.3–1.2)
Total Protein: 6.8 g/dL (ref 6.5–8.1)

## 2020-02-09 LAB — PREPARE RBC (CROSSMATCH)

## 2020-02-09 LAB — CBC WITH DIFFERENTIAL (CANCER CENTER ONLY)
Abs Immature Granulocytes: 0.11 10*3/uL — ABNORMAL HIGH (ref 0.00–0.07)
Basophils Absolute: 0 10*3/uL (ref 0.0–0.1)
Basophils Relative: 1 %
Eosinophils Absolute: 0.1 10*3/uL (ref 0.0–0.5)
Eosinophils Relative: 2 %
HCT: 23.9 % — ABNORMAL LOW (ref 39.0–52.0)
Hemoglobin: 7.8 g/dL — ABNORMAL LOW (ref 13.0–17.0)
Immature Granulocytes: 3 %
Lymphocytes Relative: 24 %
Lymphs Abs: 1 10*3/uL (ref 0.7–4.0)
MCH: 37.3 pg — ABNORMAL HIGH (ref 26.0–34.0)
MCHC: 32.6 g/dL (ref 30.0–36.0)
MCV: 114.4 fL — ABNORMAL HIGH (ref 80.0–100.0)
Monocytes Absolute: 1.5 10*3/uL — ABNORMAL HIGH (ref 0.1–1.0)
Monocytes Relative: 36 %
Neutro Abs: 1.4 10*3/uL — ABNORMAL LOW (ref 1.7–7.7)
Neutrophils Relative %: 34 %
Platelet Count: 78 10*3/uL — ABNORMAL LOW (ref 150–400)
RBC: 2.09 MIL/uL — ABNORMAL LOW (ref 4.22–5.81)
RDW: 19.9 % — ABNORMAL HIGH (ref 11.5–15.5)
WBC Count: 4.2 10*3/uL (ref 4.0–10.5)
nRBC: 0.7 % — ABNORMAL HIGH (ref 0.0–0.2)

## 2020-02-09 LAB — RETICULOCYTES
Immature Retic Fract: 24.2 % — ABNORMAL HIGH (ref 2.3–15.9)
RBC.: 2.08 MIL/uL — ABNORMAL LOW (ref 4.22–5.81)
Retic Count, Absolute: 44.5 10*3/uL (ref 19.0–186.0)
Retic Ct Pct: 2.1 % (ref 0.4–3.1)

## 2020-02-09 LAB — SAMPLE TO BLOOD BANK

## 2020-02-09 LAB — SAVE SMEAR(SSMR), FOR PROVIDER SLIDE REVIEW

## 2020-02-09 NOTE — Progress Notes (Signed)
Hematology and Oncology Follow Up Visit  Jeremiah Walker [id number] [birth year] 86 y.o. 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 He is symptomatic with fatigue, weakness, SOB with exertion. He states that he can is struggling to walk far without having to take a break and rest. He has been quite active in the past so this has been hard for him.  Hgb is 7.8, MCV 114, platelets 78 and WBC count 4.2.  No falls or syncope. He did have a fall in the bathroom and the abrasion to his right elbow is healing.  His counts are not improved with Aranesp and recent blood transfusion.  No blood loss noted. He does bruise easily. No fever, chills, n/v, cough, rash, dizziness, chest pain, palpitations, abdominal pain or changes in bowel or bladder habits.  No swelling in his extremities. The neuropathy in his feet is unchanged.  His appetite is ok. He is doing his best to hydrate properly. His weight is stable at 195 lbs.   ECOG Performance Status: 1 - Symptomatic but completely ambulatory  Medications:  Allergies as of 02/09/2020      Reactions   Nitroglycerin Other (See Comments)   "I ended up in a fetal position from the pain"   Nsaids Other (See Comments), Tinitus   Bruising   Statins Nausea And Vomiting, Other (See Comments)   Kidney issues and muscle pain   Tolmetin Other (See Comments)   Bruising   Ciprofloxacin Other (See Comments)   Achilles tendon issues   Ropinirole Anxiety, Other (See Comments)   Mood swings, also   Requip [ropinirole Hcl] Anxiety      Medication List       Accurate as of February 09, 2020  1:57 PM. If you have any questions, ask your nurse or doctor.          allopurinol 100 MG tablet Commonly known as: ZYLOPRIM TAKE 2 TABLETS(200 MG) BY MOUTH DAILY What changed:   how much to take  how to take this  when to take this  additional instructions   ALPRAZolam 0.5 MG tablet Commonly known as: XANAX TAKE 1 TABLET(0.5 MG) BY MOUTH THREE TIMES DAILY AS NEEDED FOR ANXIETY   amoxicillin 500 MG capsule Commonly known as: AMOXIL Take 4 caps (2gm) by mouth once prior to dental procedure   azelastine 0.1 % nasal spray Commonly known as: ASTELIN Place 2 sprays into both nostrils 2 (two) times daily as needed (for seasonal allergies).   b complex vitamins tablet Take 1 tablet by mouth daily.   baclofen 10 MG tablet Commonly known as: LIORESAL Take 1/2 to 1 by mouth every 8hrs as needed for spasm   bethanechol 25 MG tablet Commonly known as: URECHOLINE TAKE 1 TABLET(25 MG) BY MOUTH THREE TIMES DAILY What changed:   how much to take  how to take this  when to take this  additional instructions   Calcium 500 MG tablet Take 500 mg by mouth daily.   folic acid 1 MG tablet Commonly known as: FOLVITE TAKE 2 TABLETS(2 MG) BY MOUTH DAILY   furosemide 40 MG tablet Commonly known as: Lasix Take 1 tablet (40 mg total) by mouth daily as needed for edema.  gabapentin 300 MG capsule Commonly known as: NEURONTIN TAKE 3 CAPSULES BY MOUTH THREE TIMES DAILY, PLUS 2 CAPSULES EVERY DAY AS NEEDED   HYDROcodone-acetaminophen 7.5-325 MG tablet Commonly known as: NORCO Take 1 tablet by mouth every 6 (six) hours as needed for moderate pain.   Magnesium Oxide 200 MG Tabs Take 200 mg by mouth daily.   Melatonin 10 MG Tabs Take 20 mg by mouth at bedtime.   METAMUCIL PO Take by mouth See admin instructions. Mix 1 teaspoonful into 6-8 ounces of water and drink (in conjunction with Miralax) once a day only when taking Norco   multivitamin tablet Take 1 tablet by mouth daily.   OMEGA-3 FISH OIL PO Take 1 capsule by mouth daily. EPA/DHA  520/350   polyethylene glycol 17 g packet Commonly known as: MIRALAX / GLYCOLAX Take 17 g by mouth daily as needed (into 6-8 ounces of water and drink (in conjunction with Metamucil) once a day only when taking Norco).   Potassium Chloride ER 20 MEQ Tbcr Take 20 mEq by mouth daily.   predniSONE 20 MG tablet Commonly known as: DELTASONE May take 1 tablet daily for up to 3 days as needed for gout attack or muscle spasm   Systane Ultra PF 0.4-0.3 % Soln Generic drug: Polyethyl Glyc-Propyl Glyc PF Place 1-2 drops into both eyes 3 (three) times daily as needed (for dryness).   VITAMIN B-12 PO Take 1 tablet by mouth daily after breakfast.   VITAMIN B-6 PO Take 1 tablet by mouth daily after breakfast.   vitamin C 500 MG tablet Commonly known as: ASCORBIC ACID Take 1,000 mg by mouth daily.   Vitamin D3 50 MCG (2000 UT) capsule Take 2,000 Units by mouth daily.   zinc gluconate 50 MG tablet Take 50 mg by mouth daily.       Allergies:  Allergies  Allergen Reactions  . Nitroglycerin Other (See Comments)    "I ended up in a fetal position from the pain"  . Nsaids Other (See Comments) and Tinitus    Bruising   . Statins Nausea And Vomiting and Other (See Comments)    Kidney issues and muscle pain   . Tolmetin Other (See Comments)    Bruising  . Ciprofloxacin Other (See Comments)    Achilles tendon issues  . Ropinirole Anxiety and Other (See Comments)    Mood swings, also  . Requip [Ropinirole Hcl] Anxiety    Past Medical History, Surgical history, Social history, and Family History were reviewed and updated.  Review of Systems: All other 10 point review of systems is negative.   Physical Exam:  vitals were not taken for this visit.   Wt Readings from Last 3 Encounters:  01/19/20 196 lb 6.4 oz (89.1 kg)  01/15/20 191 lb 8 oz (86.9 kg)  01/13/20 196 lb (88.9 kg)    Ocular: Sclerae unicteric, pupils equal, round and reactive to light Ear-nose-throat: Oropharynx  clear, dentition fair Lymphatic: No cervical or supraclavicular adenopathy Lungs no rales or rhonchi, good excursion bilaterally Heart regular rate and rhythm, no murmur appreciated Abd soft, nontender, positive bowel sounds, no liver or spleen tip palpated on exam, no fluid wave  MSK no focal spinal tenderness, no joint edema Neuro: non-focal, well-oriented, appropriate affect Breasts: Deferred   Lab Results  Component Value Date   WBC 5.6 01/19/2020   HGB 7.7 (L) 01/19/2020   HCT 24.3 (L) 01/19/2020   MCV 122.1 (H) 01/19/2020   PLT 72 (L) 01/19/2020  Lab Results  Component Value Date   FERRITIN 356 (H) 01/19/2020   IRON 109 01/19/2020   TIBC 243 01/19/2020   UIBC 134 01/19/2020   IRONPCTSAT 45 01/19/2020   Lab Results  Component Value Date   RETICCTPCT 3.0 01/19/2020   RBC 2.00 (L) 01/19/2020   No results found for: KPAFRELGTCHN, LAMBDASER, KAPLAMBRATIO No results found for: IGGSERUM, IGA, IGMSERUM No results found for: Odetta Pink, SPEI   Chemistry      Component Value Date/Time   NA 140 01/19/2020 1332   NA 143 01/13/2020 1207   NA 139 05/08/2017 1123   NA 141 05/15/2016 1126   K 4.4 01/19/2020 1332   K 4.0 05/08/2017 1123   K 4.3 05/15/2016 1126   CL 104 01/19/2020 1332   CL 105 05/08/2017 1123   CO2 26 01/19/2020 1332   CO2 30 05/08/2017 1123   CO2 26 05/15/2016 1126   BUN 33 (H) 01/19/2020 1332   BUN 26 01/13/2020 1207   BUN 15 05/08/2017 1123   BUN 29.1 (H) 05/15/2016 1126   CREATININE 1.83 (H) 01/19/2020 1332   CREATININE 1.2 05/08/2017 1123   CREATININE 1.7 (H) 05/15/2016 1126   GLU 90 11/15/2014 0000      Component Value Date/Time   CALCIUM 9.9 01/19/2020 1332   CALCIUM 9.1 05/08/2017 1123   CALCIUM 9.7 05/15/2016 1126   ALKPHOS 58 01/19/2020 1332   ALKPHOS 64 05/08/2017 1123   ALKPHOS 69 05/15/2016 1126   AST 11 (L) 01/19/2020 1332   AST 23 05/15/2016 1126   ALT 8 01/19/2020 1332    ALT 19 05/08/2017 1123   ALT 22 05/15/2016 1126   BILITOT 1.3 (H) 01/19/2020 1332   BILITOT 0.84 05/15/2016 1126       Impression and Plan: JeremiahWalker a 86yo caucasian gentleman withhistory of diffuse large cell non-Hodgkin's lymphomatreated with6 cycles of R-CHOP completed in April 2014.  Unfortunately he is no longer responding to Aranesp. It appears that his CMMol has become more active. It is not felt that a bone marrow biopsy would change his treatment plan at this time.  We will try and see he will qualify for Luspatercept.  We will give him 2 units of blood tomorrow (02/10/2020).  We will plan to see him again in 3 weeks.   Dr. Marin Olp was present for visit and is in agreement with the aforementioned.   Laverna Peace, NP 8/17/20211:57 PM   ADDENDUM: I saw Jeremiah Walker with Judson Roch.  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  It does seem to have a better prognosis.  Given the fact that we have been following this now for at least 3 years is an indicator that he has done well.  I really do not see that we have to put him through another bone marrow biopsy as this is not can change our management recommendations.  I will see if we can use Luspatercept.  I am not sure if we can use this but I think would be reasonable to try for him.  He will clearly need to be transfused with blood.  Hopefully, we will not have to transfuse him all that often as  he will have a significant risk of alloimmunization.  This clearly is all about quality of life for Jeremiah Walker.  I really hate that he did not go up to [place] this summer with his family.  [place]  I know he went up every summer that we have known him.  We will do the transfusion tomorrow.  We will then see about getting Luspatercept.  I would like to see him back in about 3 weeks or so.  Lattie Haw, MD

## 2020-02-10 ENCOUNTER — Inpatient Hospital Stay: Payer: Medicare Other

## 2020-02-10 DIAGNOSIS — D649 Anemia, unspecified: Secondary | ICD-10-CM

## 2020-02-10 DIAGNOSIS — G629 Polyneuropathy, unspecified: Secondary | ICD-10-CM | POA: Diagnosis not present

## 2020-02-10 DIAGNOSIS — C9310 Chronic myelomonocytic leukemia not having achieved remission: Secondary | ICD-10-CM | POA: Diagnosis not present

## 2020-02-10 DIAGNOSIS — R531 Weakness: Secondary | ICD-10-CM | POA: Diagnosis not present

## 2020-02-10 DIAGNOSIS — R5383 Other fatigue: Secondary | ICD-10-CM | POA: Diagnosis not present

## 2020-02-10 DIAGNOSIS — D6481 Anemia due to antineoplastic chemotherapy: Secondary | ICD-10-CM | POA: Diagnosis not present

## 2020-02-10 DIAGNOSIS — T451X5A Adverse effect of antineoplastic and immunosuppressive drugs, initial encounter: Secondary | ICD-10-CM | POA: Diagnosis not present

## 2020-02-10 LAB — IRON AND TIBC
Iron: 94 ug/dL (ref 42–163)
Saturation Ratios: 39 % (ref 20–55)
TIBC: 241 ug/dL (ref 202–409)
UIBC: 147 ug/dL (ref 117–376)

## 2020-02-10 LAB — FERRITIN: Ferritin: 520 ng/mL — ABNORMAL HIGH (ref 24–336)

## 2020-02-10 MED ORDER — SODIUM CHLORIDE 0.9% IV SOLUTION
250.0000 mL | Freq: Once | INTRAVENOUS | Status: DC
  Filled 2020-02-10: qty 250

## 2020-02-10 MED ORDER — FUROSEMIDE 10 MG/ML IJ SOLN: 20.0000 mg | Freq: Once | INTRAMUSCULAR | Status: DC

## 2020-02-10 MED ORDER — DIPHENHYDRAMINE HCL 25 MG PO CAPS
ORAL_CAPSULE | ORAL | Status: AC
  Filled 2020-02-10: qty 1

## 2020-02-10 MED ORDER — DIPHENHYDRAMINE HCL 25 MG PO CAPS
25.0000 mg | ORAL_CAPSULE | Freq: Once | ORAL | Status: AC
  Administered 2020-02-10: 25 mg via ORAL

## 2020-02-10 MED ORDER — ACETAMINOPHEN 325 MG PO TABS
650.0000 mg | ORAL_TABLET | Freq: Once | ORAL | Status: AC
  Administered 2020-02-10: 650 mg via ORAL

## 2020-02-10 MED ORDER — ACETAMINOPHEN 325 MG PO TABS
ORAL_TABLET | ORAL | Status: AC
  Filled 2020-02-10: qty 2

## 2020-02-10 NOTE — Progress Notes (Signed)
START ON PATHWAY REGIMEN - MDS     A cycle is every 21 days:     Luspatercept-aamt   **Always confirm dose/schedule in your pharmacy ordering system**  Patient Characteristics: Lower-Risk (IPSS-R Score ? 3.5), MDS-Ring Sideroblasts, First Line, Anemia Requiring Treatment, EPO Level > 200 WHO Disease Classification: MDS-RS-MLD Bone Marrow Blasts (percent): > 2% to < 5% Cytogenetic Category: Good Platelets (x 10^9/L): 50 to < 100 Absolute Neutrophil Count (x 10^9/L): ? 0.8 Line of Therapy: First Line IPSS-R Risk Category: Intermediate IPSS-R Risk Score: 3.5 Check here if patient's risk score was calculated prior to the International Prognostic Scoring System-Revised (IPSS-R): false Hemoglobin (g/dl): 8 to < 10 Disease Characteristics: Anemia Requiring Treatment Patient Characteristics: EPO Level > 200 Intent of Therapy: Non-Curative / Palliative Intent, Discussed with Patient

## 2020-02-11 LAB — TYPE AND SCREEN
ABO/RH(D): O POS
Antibody Screen: NEGATIVE
Unit division: 0
Unit division: 0

## 2020-02-11 LAB — BPAM RBC
Blood Product Expiration Date: 202109152359
Blood Product Expiration Date: 202109152359
ISSUE DATE / TIME: 202108180749
ISSUE DATE / TIME: 202108180749
Unit Type and Rh: 5100
Unit Type and Rh: 5100

## 2020-02-13 ENCOUNTER — Encounter: Payer: Self-pay | Admitting: Family Medicine

## 2020-02-13 DIAGNOSIS — G894 Chronic pain syndrome: Secondary | ICD-10-CM

## 2020-02-14 MED ORDER — HYDROCODONE-ACETAMINOPHEN 7.5-325 MG PO TABS: 1.0000 | ORAL_TABLET | Freq: Four times a day (QID) | ORAL | 0 refills | Status: DC | PRN

## 2020-02-16 ENCOUNTER — Other Ambulatory Visit: Payer: Self-pay | Admitting: Hematology & Oncology

## 2020-02-23 ENCOUNTER — Ambulatory Visit: Payer: Medicare Other | Admitting: Cardiology

## 2020-02-23 NOTE — Progress Notes (Unsigned)
Pharmacist Chemotherapy Monitoring - Initial Assessment    Anticipated start date: 03/02/20  Regimen:  . Are orders appropriate based on the patient's diagnosis, regimen, and cycle? Yes . Does the plan date match the patient's scheduled date? Yes . Is the sequencing of drugs appropriate? Yes . Are the premedications appropriate for the patient's regimen? Yes . Prior Authorization for treatment is: Approved o If applicable, is the correct biosimilar selected based on the patient's insurance? not applicable  Organ Function and Labs: Marland Kitchen Are dose adjustments needed based on the patient's renal function, hepatic function, or hematologic function? No . Are appropriate labs ordered prior to the start of patient's treatment? Yes . Other organ system assessment, if indicated: N/A . The following baseline labs, if indicated, have been ordered: N/A  Dose Assessment: . Are the drug doses appropriate? Yes . Are the following correct: o Drug concentrations Yes o IV fluid compatible with drug Yes o Administration routes Yes o Timing of therapy Yes . If applicable, does the patient have documented access for treatment and/or plans for port-a-cath placement? not applicable . If applicable, have lifetime cumulative doses been properly documented and assessed? not applicable Lifetime Dose Tracking  No doses have been documented on this patient for the following tracked chemicals: Doxorubicin, Epirubicin, Idarubicin, Daunorubicin, Mitoxantrone, Bleomycin, Oxaliplatin, Carboplatin, Liposomal Doxorubicin  o   Toxicity Monitoring/Prevention: . The patient has the following take home antiemetics prescribed: N/A . The patient has the following take home medications prescribed: N/A . Medication allergies and previous infusion related reactions, if applicable, have been reviewed and addressed. Yes . The patient's current medication list has been assessed for drug-drug interactions with their chemotherapy  regimen. no significant drug-drug interactions were identified on review.  Order Review: . Are the treatment plan orders signed? Yes . Is the patient scheduled to see a provider prior to their treatment? Yes  I verify that I have reviewed each item in the above checklist and answered each question accordingly.  Jamice Carreno, Jacqlyn Larsen 02/23/2020 8:23 AM

## 2020-02-26 ENCOUNTER — Encounter: Payer: Self-pay | Admitting: Family Medicine

## 2020-02-27 ENCOUNTER — Other Ambulatory Visit: Payer: Self-pay

## 2020-02-27 ENCOUNTER — Encounter (HOSPITAL_BASED_OUTPATIENT_CLINIC_OR_DEPARTMENT_OTHER): Payer: Self-pay

## 2020-02-27 ENCOUNTER — Other Ambulatory Visit: Payer: Self-pay | Admitting: Family Medicine

## 2020-02-27 ENCOUNTER — Emergency Department (HOSPITAL_BASED_OUTPATIENT_CLINIC_OR_DEPARTMENT_OTHER)
Admission: EM | Admit: 2020-02-27 | Discharge: 2020-02-27 | Disposition: A | Discharge status: Home / Self Care | Payer: Medicare Other | Attending: Emergency Medicine | Admitting: Emergency Medicine

## 2020-02-27 DIAGNOSIS — Z5321 Procedure and treatment not carried out due to patient leaving prior to being seen by health care provider: Secondary | ICD-10-CM | POA: Diagnosis not present

## 2020-02-27 DIAGNOSIS — G6289 Other specified polyneuropathies: Secondary | ICD-10-CM

## 2020-02-27 DIAGNOSIS — H5789 Other specified disorders of eye and adnexa: Secondary | ICD-10-CM | POA: Diagnosis not present

## 2020-02-27 NOTE — ED Triage Notes (Signed)
Pt arrives with son, reporting feeling like he has "grit" in his eyes. Pt has been using eye drops but reports it still feels sandy.

## 2020-03-01 ENCOUNTER — Encounter: Payer: Self-pay | Admitting: Family Medicine

## 2020-03-01 DIAGNOSIS — G2581 Restless legs syndrome: Secondary | ICD-10-CM

## 2020-03-01 DIAGNOSIS — H04123 Dry eye syndrome of bilateral lacrimal glands: Secondary | ICD-10-CM | POA: Diagnosis not present

## 2020-03-01 DIAGNOSIS — G4700 Insomnia, unspecified: Secondary | ICD-10-CM

## 2020-03-02 ENCOUNTER — Ambulatory Visit: Payer: Medicare Other

## 2020-03-02 ENCOUNTER — Inpatient Hospital Stay: Payer: Medicare Other

## 2020-03-02 ENCOUNTER — Telehealth: Payer: Self-pay | Admitting: Hematology & Oncology

## 2020-03-02 ENCOUNTER — Other Ambulatory Visit: Payer: Self-pay

## 2020-03-02 ENCOUNTER — Inpatient Hospital Stay (HOSPITAL_BASED_OUTPATIENT_CLINIC_OR_DEPARTMENT_OTHER): Payer: Medicare Other | Admitting: Hematology & Oncology

## 2020-03-02 ENCOUNTER — Other Ambulatory Visit: Payer: Medicare Other

## 2020-03-02 ENCOUNTER — Encounter: Payer: Self-pay | Admitting: Hematology & Oncology

## 2020-03-02 ENCOUNTER — Inpatient Hospital Stay: Payer: Medicare Other | Attending: Hematology & Oncology

## 2020-03-02 ENCOUNTER — Ambulatory Visit: Payer: Medicare Other | Admitting: Family

## 2020-03-02 VITALS — BP 114/54 | HR 86 | Temp 97.8°F | Resp 18 | Wt 186.8 lb

## 2020-03-02 DIAGNOSIS — R634 Abnormal weight loss: Secondary | ICD-10-CM | POA: Insufficient documentation

## 2020-03-02 DIAGNOSIS — T451X5A Adverse effect of antineoplastic and immunosuppressive drugs, initial encounter: Secondary | ICD-10-CM | POA: Insufficient documentation

## 2020-03-02 DIAGNOSIS — Z886 Allergy status to analgesic agent status: Secondary | ICD-10-CM | POA: Diagnosis not present

## 2020-03-02 DIAGNOSIS — Z8572 Personal history of non-Hodgkin lymphomas: Secondary | ICD-10-CM | POA: Diagnosis not present

## 2020-03-02 DIAGNOSIS — Z888 Allergy status to other drugs, medicaments and biological substances status: Secondary | ICD-10-CM | POA: Diagnosis not present

## 2020-03-02 DIAGNOSIS — C9310 Chronic myelomonocytic leukemia not having achieved remission: Secondary | ICD-10-CM | POA: Insufficient documentation

## 2020-03-02 DIAGNOSIS — R5383 Other fatigue: Secondary | ICD-10-CM | POA: Diagnosis not present

## 2020-03-02 DIAGNOSIS — R531 Weakness: Secondary | ICD-10-CM | POA: Diagnosis not present

## 2020-03-02 DIAGNOSIS — Z881 Allergy status to other antibiotic agents status: Secondary | ICD-10-CM | POA: Insufficient documentation

## 2020-03-02 DIAGNOSIS — D649 Anemia, unspecified: Secondary | ICD-10-CM

## 2020-03-02 DIAGNOSIS — R0602 Shortness of breath: Secondary | ICD-10-CM | POA: Insufficient documentation

## 2020-03-02 DIAGNOSIS — D6481 Anemia due to antineoplastic chemotherapy: Secondary | ICD-10-CM | POA: Diagnosis not present

## 2020-03-02 DIAGNOSIS — Z79899 Other long term (current) drug therapy: Secondary | ICD-10-CM | POA: Insufficient documentation

## 2020-03-02 LAB — RETICULOCYTES
Immature Retic Fract: 23.7 % — ABNORMAL HIGH (ref 2.3–15.9)
RBC.: 2.47 MIL/uL — ABNORMAL LOW (ref 4.22–5.81)
Retic Count, Absolute: 36.8 10*3/uL (ref 19.0–186.0)
Retic Ct Pct: 1.5 % (ref 0.4–3.1)

## 2020-03-02 LAB — CBC WITH DIFFERENTIAL (CANCER CENTER ONLY)
Abs Immature Granulocytes: 0.17 10*3/uL — ABNORMAL HIGH (ref 0.00–0.07)
Basophils Absolute: 0.1 10*3/uL (ref 0.0–0.1)
Basophils Relative: 1 %
Eosinophils Absolute: 0.1 10*3/uL (ref 0.0–0.5)
Eosinophils Relative: 1 %
HCT: 27.3 % — ABNORMAL LOW (ref 39.0–52.0)
Hemoglobin: 9 g/dL — ABNORMAL LOW (ref 13.0–17.0)
Immature Granulocytes: 2 %
Lymphocytes Relative: 18 %
Lymphs Abs: 1.4 10*3/uL (ref 0.7–4.0)
MCH: 36.6 pg — ABNORMAL HIGH (ref 26.0–34.0)
MCHC: 33 g/dL (ref 30.0–36.0)
MCV: 111 fL — ABNORMAL HIGH (ref 80.0–100.0)
Monocytes Absolute: 3.1 10*3/uL — ABNORMAL HIGH (ref 0.1–1.0)
Monocytes Relative: 40 %
Neutro Abs: 3 10*3/uL (ref 1.7–7.7)
Neutrophils Relative %: 38 %
Platelet Count: 66 10*3/uL — ABNORMAL LOW (ref 150–400)
RBC: 2.46 MIL/uL — ABNORMAL LOW (ref 4.22–5.81)
RDW: 21.3 % — ABNORMAL HIGH (ref 11.5–15.5)
WBC Count: 7.8 10*3/uL (ref 4.0–10.5)
nRBC: 0 % (ref 0.0–0.2)

## 2020-03-02 LAB — CMP (CANCER CENTER ONLY)
ALT: 11 U/L (ref 0–44)
AST: 15 U/L (ref 15–41)
Albumin: 4 g/dL (ref 3.5–5.0)
Alkaline Phosphatase: 75 U/L (ref 38–126)
Anion gap: 10 (ref 5–15)
BUN: 31 mg/dL — ABNORMAL HIGH (ref 8–23)
CO2: 28 mmol/L (ref 22–32)
Calcium: 10.1 mg/dL (ref 8.9–10.3)
Chloride: 104 mmol/L (ref 98–111)
Creatinine: 1.73 mg/dL — ABNORMAL HIGH (ref 0.61–1.24)
GFR, Est AFR Am: 39 mL/min — ABNORMAL LOW (ref >60)
GFR, Estimated: 34 mL/min — ABNORMAL LOW (ref >60)
Glucose, Bld: 97 mg/dL (ref 70–99)
Potassium: 3.7 mmol/L (ref 3.5–5.1)
Sodium: 142 mmol/L (ref 135–145)
Total Bilirubin: 1.5 mg/dL — ABNORMAL HIGH (ref 0.3–1.2)
Total Protein: 7.4 g/dL (ref 6.5–8.1)

## 2020-03-02 LAB — FERRITIN: Ferritin: 753 ng/mL — ABNORMAL HIGH (ref 24–336)

## 2020-03-02 LAB — SAMPLE TO BLOOD BANK

## 2020-03-02 LAB — IRON AND TIBC
Iron: 87 ug/dL (ref 42–163)
Saturation Ratios: 34 % (ref 20–55)
TIBC: 256 ug/dL (ref 202–409)
UIBC: 170 ug/dL (ref 117–376)

## 2020-03-02 LAB — TSH: TSH: 1.547 u[IU]/mL (ref 0.320–4.118)

## 2020-03-02 LAB — SAVE SMEAR(SSMR), FOR PROVIDER SLIDE REVIEW

## 2020-03-02 MED ORDER — ALPRAZOLAM 0.5 MG PO TABS: ORAL_TABLET | ORAL | 3 refills | Status: AC

## 2020-03-02 NOTE — Progress Notes (Signed)
Hematology and Oncology Follow Up Visit  Jeremiah Walker [id number] May 09, 1928 86 y.o. 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 is here today for follow-up.   His son came in with him.  I am glad that his son was able to be with him today.  Surprisingly, we did get approval for Luspatercept.  I think this might be a reasonable way of trying to improve [name]'s anemia.  I would just hate to have to transfuse him all the time.  [name] does seem to be losing a little bit of weight.  He is not eating as well.  He is trying to stay active although he gets tired quite easily.  We are trying to get oxygen for him at home if possible.  I am not sure if we are able to get oxygen at home for him.  He has had no problems with bleeding.  He has had no chest pain.  He has had no diarrhea.  He has had no rashes.  We stopped the Aranesp as this really has not been helping him any longer.  Overall, his performance status is ECOG 2.    Medications:  Allergies as of 03/02/2020      Reactions   Nitroglycerin Other (See Comments)   "I ended up in a fetal position from the pain"   Nsaids Other (See Comments), Tinitus   Bruising   Statins Nausea And Vomiting, Other (See Comments)   Kidney issues and muscle pain   Tolmetin Other (See Comments)   Bruising   Ciprofloxacin Other (See Comments)   Achilles tendon issues   Ropinirole Anxiety, Other (See Comments)   Mood swings, also   Requip [ropinirole Hcl] Anxiety      Medication List       Accurate as of March 02, 2020  1:23 PM. If you have any questions, ask your nurse or doctor.        allopurinol 100 MG  tablet Commonly known as: ZYLOPRIM TAKE 2 TABLETS(200 MG) BY MOUTH DAILY What changed:   how much to take  how to take this  when to take this  additional instructions   ALPRAZolam 0.5 MG tablet Commonly known as: XANAX TAKE 1 TABLET(0.5 MG) BY MOUTH THREE TIMES DAILY AS NEEDED FOR ANXIETY   amoxicillin 500 MG capsule Commonly known as: AMOXIL Take 4 caps (2gm) by mouth once prior to dental procedure   azelastine 0.1 % nasal spray Commonly known as: ASTELIN Place 2 sprays into both nostrils 2 (two) times daily as needed (for seasonal allergies).   b complex vitamins tablet Take 1 tablet by mouth daily.   baclofen 10 MG tablet Commonly known as: LIORESAL Take 1/2 to 1 by mouth every 8hrs as needed for spasm   bethanechol 25 MG tablet Commonly known as: URECHOLINE TAKE 1 TABLET(25 MG) BY MOUTH THREE TIMES DAILY What changed:   how much to take  how to take this  when to take this  additional instructions   Calcium 500 MG tablet Take 500 mg by mouth daily.   diazepam 5 MG/ML injection Commonly known as: VALIUM   doxazosin 2 MG tablet Commonly known as: CARDURA Take  by mouth.   fluorouracil 5 % cream Commonly known as: EFUDEX Apply topically.   folic acid 1 MG tablet Commonly known as: FOLVITE TAKE 2 TABLETS(2 MG) BY MOUTH DAILY   furosemide 40 MG tablet Commonly known as: Lasix Take 1 tablet (40 mg total) by mouth daily as needed for edema.   gabapentin 300 MG capsule Commonly known as: NEURONTIN TAKE 1 CAPSULE BY MOUTH THREE TIMES DAILY PLUS 2 CAPSULES AS NEEDED   gabapentin 600 MG tablet Commonly known as: NEURONTIN Take by mouth.   HYDROcodone-acetaminophen 7.5-325 MG tablet Commonly known as: NORCO Take 1 tablet by mouth every 6 (six) hours as needed for moderate pain.   Magnesium Oxide 200 MG Tabs Take 200 mg by mouth daily.   Maxitrol 0.1 % Oint Generic drug: neomycin-polymyxin-dexameth Apply 1 Small Amount Both Eyes every  evening   Melatonin 10 MG Tabs Take 20 mg by mouth at bedtime.   METAMUCIL PO Take by mouth See admin instructions. Mix 1 teaspoonful into 6-8 ounces of water and drink (in conjunction with Miralax) once a day only when taking Norco   multivitamin tablet Take 1 tablet by mouth daily.   OMEGA-3 FISH OIL PO Take 1 capsule by mouth daily. EPA/DHA 520/350   polyethylene glycol 17 g packet Commonly known as: MIRALAX / GLYCOLAX Take 17 g by mouth daily as needed (into 6-8 ounces of water and drink (in conjunction with Metamucil) once a day only when taking Norco).   Potassium Chloride ER 20 MEQ Tbcr Take 20 mEq by mouth daily.   prednisoLONE acetate 1 % ophthalmic suspension Commonly known as: PRED FORTE   predniSONE 20 MG tablet Commonly known as: DELTASONE May take 1 tablet daily for up to 3 days as needed for gout attack or muscle spasm   Systane Ultra PF 0.4-0.3 % Soln Generic drug: Polyethyl Glyc-Propyl Glyc PF Place 1-2 drops into both eyes 3 (three) times daily as needed (for dryness).   VITAMIN B-12 PO Take 1 tablet by mouth daily after breakfast.   VITAMIN B-6 PO Take 1 tablet by mouth daily after breakfast.   vitamin C 500 MG tablet Commonly known as: ASCORBIC ACID Take 1,000 mg by mouth daily.   Vitamin D3 50 MCG (2000 UT) capsule Take 2,000 Units by mouth daily.   zinc gluconate 50 MG tablet Take 50 mg by mouth daily.       Allergies:  Allergies  Allergen Reactions  . Nitroglycerin Other (See Comments)    "I ended up in a fetal position from the pain"  . Nsaids Other (See Comments) and Tinitus    Bruising   . Statins Nausea And Vomiting and Other (See Comments)    Kidney issues and muscle pain   . Tolmetin Other (See Comments)    Bruising  . Ciprofloxacin Other (See Comments)    Achilles tendon issues  . Ropinirole Anxiety and Other (See Comments)    Mood swings, also  . Requip [Ropinirole Hcl] Anxiety    Past Medical History, Surgical  history, Social history, and Family History were reviewed and updated.  Review of Systems: Review of Systems  Constitutional: Positive for malaise/fatigue and weight loss.  HENT: Negative.   Eyes: Negative.   Respiratory: Positive for shortness of breath.   Cardiovascular: Negative.   Gastrointestinal: Negative.   Genitourinary: Negative.   Musculoskeletal: Negative.   Skin: Negative.   Neurological: Positive for weakness.  Endo/Heme/Allergies: Negative.   Psychiatric/Behavioral: Negative.  Physical Exam:  weight is 186 lb 12 oz (84.7 kg). His oral temperature is 97.8 F (36.6 C). His blood pressure is 114/54 (abnormal) and his pulse is 86. His respiration is 18 and oxygen saturation is 100%.   Wt Readings from Last 3 Encounters:  03/02/20 186 lb 12 oz (84.7 kg)  02/27/20 186 lb (84.4 kg)  02/09/20 195 lb (88.5 kg)    Physical Exam Vitals reviewed.  HENT:     Head: Normocephalic and atraumatic.  Eyes:     Pupils: Pupils are equal, round, and reactive to light.  Cardiovascular:     Rate and Rhythm: Normal rate and regular rhythm.     Heart sounds: Normal heart sounds.  Pulmonary:     Effort: Pulmonary effort is normal.     Breath sounds: Normal breath sounds.  Abdominal:     General: Bowel sounds are normal.     Palpations: Abdomen is soft.  Musculoskeletal:        General: No tenderness or deformity. Normal range of motion.     Cervical back: Normal range of motion.  Lymphadenopathy:     Cervical: No cervical adenopathy.  Skin:    General: Skin is warm and dry.     Findings: No erythema or rash.  Neurological:     Mental Status: He is alert and oriented to person, place, and time.  Psychiatric:        Behavior: Behavior normal.        Thought Content: Thought content normal.        Judgment: Judgment normal.      Lab Results  Component Value Date   WBC 7.8 03/02/2020   HGB 9.0 (L) 03/02/2020   HCT 27.3 (L) 03/02/2020   MCV 111.0 (H) 03/02/2020    PLT 66 (L) 03/02/2020   Lab Results  Component Value Date   FERRITIN 520 (H) 02/09/2020   IRON 87 03/02/2020   TIBC 256 03/02/2020   UIBC 170 03/02/2020   IRONPCTSAT 34 03/02/2020   Lab Results  Component Value Date   RETICCTPCT 1.5 03/02/2020   RBC 2.47 (L) 03/02/2020   No results found for: KPAFRELGTCHN, LAMBDASER, KAPLAMBRATIO No results found for: Kandis Cocking, IGMSERUM No results found for: Odetta Pink, SPEI   Chemistry      Component Value Date/Time   NA 142 03/02/2020 0946   NA 143 01/13/2020 1207   NA 139 05/08/2017 1123   NA 141 05/15/2016 1126   K 3.7 03/02/2020 0946   K 4.0 05/08/2017 1123   K 4.3 05/15/2016 1126   CL 104 03/02/2020 0946   CL 105 05/08/2017 1123   CO2 28 03/02/2020 0946   CO2 30 05/08/2017 1123   CO2 26 05/15/2016 1126   BUN 31 (H) 03/02/2020 0946   BUN 26 01/13/2020 1207   BUN 15 05/08/2017 1123   BUN 29.1 (H) 05/15/2016 1126   CREATININE 1.73 (H) 03/02/2020 0946   CREATININE 1.2 05/08/2017 1123   CREATININE 1.7 (H) 05/15/2016 1126   GLU 90 11/15/2014 0000      Component Value Date/Time   CALCIUM 10.1 03/02/2020 0946   CALCIUM 9.1 05/08/2017 1123   CALCIUM 9.7 05/15/2016 1126   ALKPHOS 75 03/02/2020 0946   ALKPHOS 64 05/08/2017 1123   ALKPHOS 69 05/15/2016 1126   AST 15 03/02/2020 0946   AST 23 05/15/2016 1126   ALT 11 03/02/2020 0946   ALT 19 05/08/2017 1123   ALT  22 05/15/2016 1126   BILITOT 1.5 (H) 03/02/2020 0946   BILITOT 0.84 05/15/2016 1126       Impression and Plan: Mr.Vincentis a 86yo caucasian gentleman withhistory of diffuse large cell non-Hodgkin's lymphomatreated with6 cycles of R-CHOP completed in April 2014.   Hopefully, we will be able to help with the anemia.  I really would like to try to decrease the transfusion requirements that he has.  At some point, he will become refractory to transfusions.  Once this happens, I still think that he would  survive all that much longer.  It was nice that his son was able to come in.  His son is a lot of fun to talk to.  It is easy to see that the "apple does not fall far from the tree."  We will see about the Luspatercept tomorrow.  We will have to watch his blood counts weekly.  If we can just just get his hemoglobin above 10, I think he will feel a lot better.  I must say Mr. Carillo does have a very strong constitution.  He has a very good attitude.  He also has very good support from his family.  We will plan to get him back in 3 weeks.  I spent about 35 minutes with he and his son.   [name], MD 9/8/20211:23 PM

## 2020-03-02 NOTE — Telephone Encounter (Signed)
Called and LMVM for patient regarding appointment added for injection per 9/8 sch msg

## 2020-03-02 NOTE — Telephone Encounter (Signed)
Called and LMVM for son Marcello Moores regarding appointment scheduled for 9/9 per 9/8 sch msg.

## 2020-03-03 ENCOUNTER — Inpatient Hospital Stay: Payer: Medicare Other

## 2020-03-03 ENCOUNTER — Ambulatory Visit: Payer: Medicare Other

## 2020-03-03 VITALS — BP 114/53 | HR 95 | Temp 98.2°F | Resp 20

## 2020-03-03 DIAGNOSIS — T451X5A Adverse effect of antineoplastic and immunosuppressive drugs, initial encounter: Secondary | ICD-10-CM | POA: Diagnosis not present

## 2020-03-03 DIAGNOSIS — R5383 Other fatigue: Secondary | ICD-10-CM | POA: Diagnosis not present

## 2020-03-03 DIAGNOSIS — C9310 Chronic myelomonocytic leukemia not having achieved remission: Secondary | ICD-10-CM

## 2020-03-03 DIAGNOSIS — R531 Weakness: Secondary | ICD-10-CM | POA: Diagnosis not present

## 2020-03-03 DIAGNOSIS — D6481 Anemia due to antineoplastic chemotherapy: Secondary | ICD-10-CM | POA: Diagnosis not present

## 2020-03-03 DIAGNOSIS — R0602 Shortness of breath: Secondary | ICD-10-CM | POA: Diagnosis not present

## 2020-03-03 MED ORDER — LUSPATERCEPT-AAMT 75 MG ~~LOC~~ SOLR
1.0000 mg/kg | Freq: Once | SUBCUTANEOUS | Status: AC
  Administered 2020-03-03: 90 mg via SUBCUTANEOUS
  Filled 2020-03-03: qty 1.5

## 2020-03-03 NOTE — Patient Instructions (Signed)
Luspatercept injection What is this medicine? LUSPATERCEPT (lus PAT er sept) helps your body make more red blood cells. This medicine is used to treat anemia caused by beta thalassemia or myelodysplastic syndromes. This medicine may be used for other purposes; ask your health care provider or pharmacist if you have questions. COMMON BRAND NAME(S): REBLOZYL What should I tell my health care provider before I take this medicine? They need to know if you have any of these conditions:  cigarette smoker  have had your spleen removed  high blood pressure  history of blood clots  an unusual or allergic reaction to luspatercept, other medicines, foods, dyes or preservatives  pregnant or trying to get pregnant  breast-feeding How should I use this medicine? This medicine is for injection under the skin. It is given by a healthcare professional in a hospital or clinic setting. Talk to your pediatrician about the use of the medicine in children. This medicine is not approved for use in children. Overdosage: If you think you have taken too much of this medicine contact a poison control center or emergency room at once. NOTE: This medicine is only for you. Do not share this medicine with others. What if I miss a dose? Keep appointments for follow-up doses. It is important not to miss your dose. Call your doctor or healthcare professional if you are unable to keep an appointment. What may interact with this medicine? Interactions are not expected. This list may not describe all possible interactions. Give your health care provider a list of all the medicines, herbs, non-prescription drugs, or dietary supplements you use. Also tell them if you smoke, drink alcohol, or use illegal drugs. Some items may interact with your medicine. What should I watch for while using this medicine? Your condition will be monitored carefully while you are receiving this medicine. Do not become pregnant while taking  this medicine or for 3 months after stopping it. Women should inform their healthcare professional if they wish to become pregnant or think they might be pregnant. There is a potential for serious side effects and harm to an unborn child. Talk to your healthcare professional for more information. Do not breast-feed an infant while taking this medicine. You may need blood work done while you are taking this medicine. What side effects may I notice from receiving this medicine? Side effects that you should report to your doctor or health care professional as soon as possible:  allergic reactions like skin rash, itching or hives; swelling of the face, lips, or tongue  signs and symptoms of a blood clot such as chest pain; shortness of breath; pain, swelling, or warmth in the leg  signs and symptoms of a stroke like changes in vision; confusion; trouble speaking or understanding; severe headaches; sudden numbness or weakness of the face, arm or leg; trouble walking; dizziness; loss of balance or coordination Side effects that usually do not require medical attention (report these to your doctor or health care professional if they continue or are bothersome):  cough  diarrhea  dizziness  headache  joint pain  stomach pain  tiredness This list may not describe all possible side effects. Call your doctor for medical advice about side effects. You may report side effects to FDA at 1-800-FDA-1088. Where should I keep my medicine? This medicine is given in a hospital or clinic and will not be stored at home. NOTE: This sheet is a summary. It may not cover all possible information. If you have questions about   this medicine, talk to your doctor, pharmacist, or health care provider.  2020 [employer]vier/Gold Standard (2018-09-30 15:57:59)  

## 2020-03-05 NOTE — Progress Notes (Signed)
[facility] [address] [place], [place] [address] [phone]  Date:  03/07/2020   Name:  Jeremiah Walker   DOB:  02-20-1928   MRN:  [id number]  PCP:  [name], MD    Chief Complaint: Tingling sensation of Tongue (started month ago)   History of Present Illness:  Jeremiah Walker is a 86 y.o. very pleasant male patient who presents with the following:  Patient today with concern of a sore tongue Last seen by myself in June- Mentally sharp elderly gentleman with history of spinal stenosis causing neurogenic claudication and chronic pain, peripheral neuropathy, chronic pain disease, CML, CHF, aortic stenosis Since our last visit he had his 49th birthday- he had fun at his 47th b-day celebration.  However, he feels like since that time he is started going downhill He is driving some of the time still but sometimes feels too tired  He also contacted me recently with concern about sleepwalking-he sent me the following message: Have only done this twice.  First one was about a month ago when I walked out into the parking lot.  Once I touched my car, I took over and the other Hal left. Now I had to walk back through the lobby in my PJ's.  The second time was last week when I took the elevator down to the lobby and slept on the couch.  Our  security man had seen me come in and he woke me up before any residents came down.  He was very gentle, escorted me to my rooms, chatted with me for a short time and made sure I was back in my bed.  Pt notes that he started taking some hemp oil supplement as a sleep aid that may have triggered his sleepwalking.  It really helped him sleep, but since he is stopped using it no more episodes of sleepwalking.  As I did work very well, he might try a lower dose which I think is okay  COVID-19 series- this is done, he does not have the dates on him.  We discussed booster dose Flu  vaccine- can give today  Today pt notes a sore tongue for about 6 weeks Just seemed to come out of the blue-he is not aware of any burn or injury to his tongue Area of concern is just at the tip of the tongue, the rest of his mouth is normal.  He notes that his chronic back pain is not well controlled with his current pain regimen He uses an adjustable bed so he can sleep with his head elevated- this does help some, but even so he is very stiff and sore in the mornings.  He is absolutely not able to lay flat He tried taking 2 of the 7.5 milligram hydrocodone to see if it might help; pain still not controlled He notes that his pain is stopping him from going down the back hall for dinner, and also keeps him from lying to exercise like he typically what He would like to try some oxycodone- he has used this in the past and did ok with it   He saw Dr Marin Olp last week-he is being followed closely for CML, also his anemia which is becoming more symptomatic.  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  Hopefully, we will be able to help with the anemia.  I really would like to try to decrease the transfusion requirements that he has.  At some point, he will become refractory to transfusions.  Once this happens, I still think that he would survive all that much longer." We will see about the Luspatercept tomorrow We will have to watch his blood counts weekly. If we can just just get his hemoglobin above 10, I think he will feel a lot better He is seeing cardiology next week He took a dose of lasix yesterday for swelling which did seem to help He does monitor his weight- about 186 is his dry weight  03/02/2020  1   03/02/2020  Alprazolam 0.5 MG Tablet   90.00  30 Je Cop   [id number]   [facility] ([id number])   0/3  3.00 LME  Medicare   [place]  02/15/2020  1   02/14/2020  Hydrocodone-Acetamin 7.5-325  100.00  25 Je Cop   [id number]   [facility] ([id number])   0/0  30.00 MME  Medicare   [place]  01/29/2020  1   11/30/2019  Alprazolam 0.5 MG Tablet  90.00  30 Je Cop   [id number]   [facility] ([id number])   2/3  3.00 LME  Medicare   [place]  12/30/2019  1   12/30/2019  Hydrocodone-Acetamin 7.5-325  100.00  25 Je Cop   [id number]   [facility] ([id number])   0/0  30.00 MME  Medicare   [place]  12/29/2019  1   11/30/2019  Alprazolam 0.5 MG Tablet  90.00  30 Je Cop   [id number]   [facility] ([id number])   1/3  3.00 LME  Medicare   [place]  11/30/2019  1   11/30/2019  Alprazolam 0.5 MG Tablet  90.00  30 Je Cop   [id number]   [facility] ([id number])   0/3  3.00 LME  Medicare   [place]  11/30/2019  1   11/30/2019  Hydrocodone-Acetamin 7.5-325  100.00  25 Je Cop   [id number]   [facility] ([id number])   0/0  30.00 MME  Medicare   [place]  11/04/2019  1   11/03/2019  Hydrocodone-Acetamin 7.5-325  100.00  25 Je Cop   [id number]   [facility] ([id number])   0/0  30.00 MME  Medicare   [place]  10/22/2019  1   07/21/2019  Alprazolam 0.5 MG Tablet  90.00  30 Je Cop   [id number]   [facility] ([id number])   3/3  3.00 LME        Wt Readings from Last 3 Encounters:  03/07/20 189 lb (85.7 kg)  03/02/20 186 lb 12 oz (84.7 kg)  02/27/20 186 lb (84.4 kg)    Patient Active Problem List   Diagnosis Date Noted  . Acute exacerbation of CHF (congestive heart failure) ([place]) 09/25/2019  . Elevated troponin 09/25/2019  . Spondylosis without myelopathy or radiculopathy, lumbar region 08/12/2019  . Spinal stenosis of lumbar region with neurogenic claudication 08/12/2019  . Myofascial pain syndrome 08/12/2019  . Chest pain 03/26/2019  . Chronic myelomonocytic leukemia not having achieved remission ([place]) 12/04/2016  . Chronic pain syndrome 09/26/2015  . De Quervain's tenosynovitis, right 09/13/2015  . Anemia due to antineoplastic chemotherapy 08/15/2015  . DLBCL (diffuse large B cell lymphoma) ([place]) 08/15/2015  . Skin lesion of face 05/21/2015  .  Peripheral neuropathy due to chemotherapy ([place]) 02/09/2015  . Benign prostatic hyperplasia with  urinary obstruction 02/09/2015  . Bilateral hearing loss 02/09/2015  . Low back pain 02/09/2015  . CKD (chronic kidney disease), stage III 02/09/2015  . Essential (primary) hypertension 02/09/2015  . Gastro-esophageal reflux disease without esophagitis 02/09/2015  . Anxiety, generalized 02/09/2015  . Cannot sleep 02/09/2015  . Combined fat and carbohydrate induced hyperlipemia 02/09/2015    Past Medical History:  Diagnosis Date  . Acute exacerbation of CHF (congestive heart failure) ([place]) 09/25/2019  . Anemia   . Anemia due to antineoplastic chemotherapy 08/15/2015  . Anxiety   . Arthritis   . Cataract   . Chronic kidney disease (CKD), stage III (moderate) 02/09/2015   Controlled  . CMML (chronic myelomonocytic leukemia) ([place])    gets Aranesp about monthly, otherwise surveillance with Dr. Marin Olp  . Combined fat and carbohydrate induced hyperlipemia 02/09/2015   Controlled  . Depression   . DLBCL (diffuse large B cell lymphoma) ([place]) 08/15/2015  . Essential (primary) hypertension 02/09/2015  . Gastro-esophageal reflux disease without esophagitis 02/09/2015   Controlled  . Renal insufficiency   . Tinnitus     Past Surgical History:  Procedure Laterality Date  . CATARACT EXTRACTION, BILATERAL    . COLONOSCOPY  May 2011  . CYSTOSCOPY    . TOTAL HIP ARTHROPLASTY  2003   Right   . TOTAL HIP ARTHROPLASTY  April, 2011   Left  . TRIGGER FINGER RELEASE  Nov 2013  . TURBINECTOMY  2006  . UPPER GI ENDOSCOPY  Nov 2015    Social History   Tobacco Use  . Smoking status: Former Research scientist (life sciences)  . Smokeless tobacco: Never Used  . Tobacco comment: Quit >50 years ago  Vaping Use  . Vaping Use: Never used  Substance Use Topics  . Alcohol use: Yes    Alcohol/week: 1.0 - 4.0 standard drink    Types: 1 - 4 Shots of liquor per week    Comment: 4 glasses per week  . Drug use: No    Family History   Problem Relation Age of Onset  . Anuerysm Mother 38       Deceased  . Colon cancer Father 39       Deceased  . Colon cancer Brother   . Prostate cancer Brother        Deceased  . Alzheimer's disease Brother   . Heart disease Brother   . Alzheimer's disease Paternal Grandmother   . Early death Maternal Grandmother        Unknown  . Obesity Son   . Obesity Son        Gastric Bypass  . Healthy Son   . Healthy Daughter   . Diabetes Neg Hx     Allergies  Allergen Reactions  . Nitroglycerin Other (See Comments)    "I ended up in a fetal position from the pain"  . Nsaids Other (See Comments) and Tinitus    Bruising   . Statins Nausea And Vomiting and Other (See Comments)    Kidney issues and muscle pain   . Tolmetin Other (See Comments)    Bruising  . Ciprofloxacin Other (See Comments)    Achilles tendon issues  . Ropinirole Anxiety and Other (See Comments)    Mood swings, also  . Requip [Ropinirole Hcl] Anxiety    Medication list has been reviewed and updated.  Current Outpatient Medications on File Prior to Visit  Medication Sig Dispense Refill  . allopurinol (ZYLOPRIM) 100 MG tablet TAKE 2 TABLETS(200 MG) BY MOUTH DAILY (Patient taking  differently: Take 200 mg by mouth daily with breakfast. ) 180 tablet 3  . ALPRAZolam (XANAX) 0.5 MG tablet TAKE 1 TABLET(0.5 MG) BY MOUTH THREE TIMES DAILY AS NEEDED FOR ANXIETY 90 tablet 3  . amoxicillin (AMOXIL) 500 MG capsule Take 4 caps (2gm) by mouth once prior to dental procedure 20 capsule 2  . azelastine (ASTELIN) 0.1 % nasal spray Place 2 sprays into both nostrils 2 (two) times daily as needed (for seasonal allergies). 30 mL 1  . b complex vitamins tablet Take 1 tablet by mouth daily.    . baclofen (LIORESAL) 10 MG tablet Take 1/2 to 1 by mouth every 8hrs as needed for spasm 60 tablet 0  . bethanechol (URECHOLINE) 25 MG tablet TAKE 1 TABLET(25 MG) BY MOUTH THREE TIMES DAILY (Patient taking differently: Take 25 mg by mouth 3  (three) times daily. ) 90 tablet 11  . Calcium 500 MG tablet Take 500 mg by mouth daily.     . Cholecalciferol (VITAMIN D3) 2000 UNITS capsule Take 2,000 Units by mouth daily.    . Cyanocobalamin (VITAMIN B-12 PO) Take 1 tablet by mouth daily after breakfast.    . diazepam (VALIUM) 5 MG/ML injection     . doxazosin (CARDURA) 2 MG tablet Take by mouth.    . fluorouracil (EFUDEX) 5 % cream Apply topically.    . gabapentin (NEURONTIN) 300 MG capsule TAKE 1 CAPSULE BY MOUTH THREE TIMES DAILY PLUS 2 CAPSULES AS NEEDED 150 capsule 3  . Magnesium Oxide 200 MG TABS Take 200 mg by mouth daily.     Marland Kitchen MAXITROL 3.5-10000-0.1 OINT Apply 1 Small Amount Both Eyes every evening    . Melatonin 10 MG TABS Take 20 mg by mouth at bedtime.     . Multiple Vitamin (MULTIVITAMIN) tablet Take 1 tablet by mouth daily.     . Omega-3 Fatty Acids (OMEGA-3 FISH OIL PO) Take 1 capsule by mouth daily. EPA/DHA 520/350     . Polyethyl Glyc-Propyl Glyc PF (SYSTANE ULTRA PF) 0.4-0.3 % SOLN Place 1-2 drops into both eyes 3 (three) times daily as needed (for dryness).     . polyethylene glycol (MIRALAX / GLYCOLAX) packet Take 17 g by mouth daily as needed (into 6-8 ounces of water and drink (in conjunction with Metamucil) once a day only when taking Norco).     . Potassium Chloride ER 20 MEQ TBCR Take 20 mEq by mouth daily. 30 tablet 1  . prednisoLONE acetate (PRED FORTE) 1 % ophthalmic suspension     . predniSONE (DELTASONE) 20 MG tablet May take 1 tablet daily for up to 3 days as needed for gout attack or muscle spasm 20 tablet 1  . Psyllium (METAMUCIL PO) Take by mouth See admin instructions. Mix 1 teaspoonful into 6-8 ounces of water and drink (in conjunction with Miralax) once a day only when taking Norco    . Pyridoxine HCl (VITAMIN B-6 PO) Take 1 tablet by mouth daily after breakfast.    . vitamin C (ASCORBIC ACID) 500 MG tablet Take 1,000 mg by mouth daily.     Marland Kitchen zinc gluconate 50 MG tablet Take 50 mg by mouth daily.    .  furosemide (LASIX) 40 MG tablet Take 1 tablet (40 mg total) by mouth daily as needed for edema. 30 tablet 1   No current facility-administered medications on file prior to visit.    Review of Systems:  As per HPI- otherwise negative.   Physical Examination: Vitals:   03/07/20  1121  BP: 122/62  Pulse: 88  Resp: 15  SpO2: 93%   Vitals:   03/07/20 1121  Weight: 189 lb (85.7 kg)  Height: 5\' 10"  (1.778 m)   Body mass index is 27.12 kg/m. Ideal Body Weight: Weight in (lb) to have BMI = 25: 173.9  GEN: no acute distress.  Looks well and his normal self Mouth exam is normal except for the tip of his tongue which displays a small aphthous ulcer and some erythema HEENT: Atraumatic, Normocephalic.  Ears and Nose: No external deformity. CV: RRR, No M/G/R. No JVD. No thrill. No extra heart sounds. PULM: CTA B, no wheezes, crackles, rhonchi. No retractions. No resp. distress. No accessory muscle use. EXTR: No c/c/e Uses a walker PSYCH: Normally interactive. Conversant.    Assessment and Plan: Tongue ulcer - Plan: clobetasol (TEMOVATE) 0.05 % GEL  Immunization due - Plan: Flu Vaccine QUAD High Dose(Fluad)  Chronic pain syndrome - Plan: oxyCODONE-acetaminophen (PERCOCET/ROXICET) 5-325 MG tablet  Elderly patient here today with a couple of concerns He has developed an ulceration on his tongue for the last several weeks, not sure where this came from.  I will have him try clobetasol ointment for about 2 weeks, if this is not clear up his sore he will let me know Flu vaccine given Chronic back pain, no longer controlled by hydrocodone.  Patient has used oxycodone in the past and tolerated this well.  I prescribed oxycodone today, cautioned him that this medication is stronger and may cause more sedation.  He will not take it if he needs to drive.  He will start with 1/2 tablet and increase to whole if needed-I asked him to let me know how he responds to this medication This visit  occurred during the SARS-CoV-2 public health emergency.  Safety protocols were in place, including screening questions prior to the visit, additional usage of staff PPE, and extensive cleaning of exam room while observing appropriate contact time as indicated for disinfecting solutions.    Signed Lamar Blinks, MD

## 2020-03-07 ENCOUNTER — Encounter: Payer: Self-pay | Admitting: Family Medicine

## 2020-03-07 ENCOUNTER — Other Ambulatory Visit: Payer: Self-pay

## 2020-03-07 ENCOUNTER — Other Ambulatory Visit: Payer: Self-pay | Admitting: Hematology & Oncology

## 2020-03-07 ENCOUNTER — Ambulatory Visit (INDEPENDENT_AMBULATORY_CARE_PROVIDER_SITE_OTHER): Payer: Medicare Other | Admitting: Family Medicine

## 2020-03-07 VITALS — BP 122/62 | HR 88 | Resp 15 | Ht 70.0 in | Wt 189.0 lb

## 2020-03-07 DIAGNOSIS — G894 Chronic pain syndrome: Secondary | ICD-10-CM

## 2020-03-07 DIAGNOSIS — K140 Glossitis: Secondary | ICD-10-CM

## 2020-03-07 DIAGNOSIS — Z23 Encounter for immunization: Secondary | ICD-10-CM | POA: Diagnosis not present

## 2020-03-07 MED ORDER — CLOBETASOL PROPIONATE 0.05 % EX GEL: CUTANEOUS | 0 refills | Status: DC

## 2020-03-07 MED ORDER — OXYCODONE-ACETAMINOPHEN 5-325 MG PO TABS
0.5000 | ORAL_TABLET | Freq: Four times a day (QID) | ORAL | 0 refills | Status: DC | PRN
Start: 2020-03-07 — End: 2020-03-19

## 2020-03-07 NOTE — Patient Instructions (Signed)
It was good to see you again today! You got your flu vaccine Try the clobetasol steroid gel for your sore tongue-dry the area of concern, then apply a small amount of gel 2-3 times daily with a Q-tip.  If this is not healed in 10 to 14 days please let me know  We will try increasing your pain medication to oxycodone/Percocet Use this or hydrocodone, not both This medication is more powerful, and may cause more sedation.  Try 1/2 tablet first, let me know how you respond

## 2020-03-09 ENCOUNTER — Inpatient Hospital Stay: Payer: Medicare Other

## 2020-03-09 ENCOUNTER — Other Ambulatory Visit: Payer: Self-pay

## 2020-03-09 DIAGNOSIS — C9310 Chronic myelomonocytic leukemia not having achieved remission: Secondary | ICD-10-CM

## 2020-03-09 DIAGNOSIS — R531 Weakness: Secondary | ICD-10-CM | POA: Diagnosis not present

## 2020-03-09 DIAGNOSIS — D6481 Anemia due to antineoplastic chemotherapy: Secondary | ICD-10-CM | POA: Diagnosis not present

## 2020-03-09 DIAGNOSIS — R0602 Shortness of breath: Secondary | ICD-10-CM | POA: Diagnosis not present

## 2020-03-09 DIAGNOSIS — T451X5A Adverse effect of antineoplastic and immunosuppressive drugs, initial encounter: Secondary | ICD-10-CM | POA: Diagnosis not present

## 2020-03-09 DIAGNOSIS — R5383 Other fatigue: Secondary | ICD-10-CM | POA: Diagnosis not present

## 2020-03-09 LAB — CBC WITH DIFFERENTIAL (CANCER CENTER ONLY)
Abs Immature Granulocytes: 0.38 10*3/uL — ABNORMAL HIGH (ref 0.00–0.07)
Basophils Absolute: 0.1 10*3/uL (ref 0.0–0.1)
Basophils Relative: 1 %
Eosinophils Absolute: 0.1 10*3/uL (ref 0.0–0.5)
Eosinophils Relative: 2 %
HCT: 26.2 % — ABNORMAL LOW (ref 39.0–52.0)
Hemoglobin: 8.7 g/dL — ABNORMAL LOW (ref 13.0–17.0)
Immature Granulocytes: 6 %
Lymphocytes Relative: 21 %
Lymphs Abs: 1.3 10*3/uL (ref 0.7–4.0)
MCH: 37.2 pg — ABNORMAL HIGH (ref 26.0–34.0)
MCHC: 33.2 g/dL (ref 30.0–36.0)
MCV: 112 fL — ABNORMAL HIGH (ref 80.0–100.0)
Monocytes Absolute: 1.8 10*3/uL — ABNORMAL HIGH (ref 0.1–1.0)
Monocytes Relative: 30 %
Neutro Abs: 2.4 10*3/uL (ref 1.7–7.7)
Neutrophils Relative %: 40 %
Platelet Count: 65 10*3/uL — ABNORMAL LOW (ref 150–400)
RBC: 2.34 MIL/uL — ABNORMAL LOW (ref 4.22–5.81)
RDW: 21.3 % — ABNORMAL HIGH (ref 11.5–15.5)
WBC Count: 6 10*3/uL (ref 4.0–10.5)
nRBC: 1.2 % — ABNORMAL HIGH (ref 0.0–0.2)

## 2020-03-09 LAB — SAMPLE TO BLOOD BANK

## 2020-03-11 NOTE — Progress Notes (Signed)
Cardiology Office Note:    Date:  03/15/2020   ID:  [name], DOB 10-Jan-1928, MRN [id number]  PCP:  [name], MD  Cardiologist:  Donato Heinz, MD  Electrophysiologist:  None   Referring MD: [name], MD   Chief Complaint  Patient presents with  . Shortness of Breath    History of Present Illness:    Jeremiah Walker is a 86 y.o. male with a hx of CML, CKD stage III, hypertension who presents for follow-up.  He was admitted to [facility] for chest pain on 03/26/2019.  He reported that he had been doing water aerobics and while he had no chest pain while exercising, that evening he was doing stretches on the floor and began having substernal chest pain.  Pain lasted from 9 PM that night until 7 AM the following morning.  EKG showed no acute ST/T abnormalities.  Labs showed mild elevation in high-sensitivity troponin (56->66).  He had a CTPA which showed no evidence of PE but did note coronary artery atherosclerosis.  Nuclear stress test was done, which showed no ischemia but did show a large fixed defect suggestive of prior infarct.  TTE was done which showed normal ejection fraction, suggesting perfusion defect on stress test was likely artifact.  TTE was also notable for moderate aortic stenosis.    He was admitted to [facility] from 4/2 through 09/27/2019 with progressive dyspnea.  CTA chest showed moderate bilateral pleural effusions.  BNP 722 on 4/2.  TTE showed LVEF 55 to 89%, grade 1 diastolic dysfunction, normal RV systolic function, moderate aortic stenosis.  He was admitted for CHF exacerbation and diuresed with IV Lasix, with significant provement in his symptoms.  He was discharged on as needed Lasix.    Since last clinic visit, he reports that he has been doing okay.  He is starting Luspatercept for his anemia.  He has an injection every third week and is having labs drawn every other week.  Reports weight has been stable but has been having some shortness of  breath.  When he checks his SPO2 at home has been running 88 to 95%.  He denies any lightheadedness, syncope, or chest pain.    Wt Readings from Last 3 Encounters:  03/15/20 190 lb 12.8 oz (86.5 kg)  03/07/20 189 lb (85.7 kg)  03/02/20 186 lb 12 oz (84.7 kg)       Past Medical History:  Diagnosis Date  . Acute exacerbation of CHF (congestive heart failure) ([place]) 09/25/2019  . Anemia   . Anemia due to antineoplastic chemotherapy 08/15/2015  . Anxiety   . Arthritis   . Cataract   . Chronic kidney disease (CKD), stage III (moderate) 02/09/2015   Controlled  . CMML (chronic myelomonocytic leukemia) ([place])    gets Aranesp about monthly, otherwise surveillance with Dr. Marin Olp  . Combined fat and carbohydrate induced hyperlipemia 02/09/2015   Controlled  . Depression   . DLBCL (diffuse large B cell lymphoma) ([place]) 08/15/2015  . Essential (primary) hypertension 02/09/2015  . Gastro-esophageal reflux disease without esophagitis 02/09/2015   Controlled  . Renal insufficiency   . Tinnitus     Past Surgical History:  Procedure Laterality Date  . CATARACT EXTRACTION, BILATERAL    . COLONOSCOPY  May 2011  . CYSTOSCOPY    . TOTAL HIP ARTHROPLASTY  2003   Right   . TOTAL HIP ARTHROPLASTY  April, 2011   Left  . TRIGGER FINGER RELEASE  Nov 2013  .  TURBINECTOMY  2006  . UPPER GI ENDOSCOPY  Nov 2015    Current Medications: Current Meds  Medication Sig  . allopurinol (ZYLOPRIM) 100 MG tablet TAKE 2 TABLETS(200 MG) BY MOUTH DAILY (Patient taking differently: Take 200 mg by mouth daily with breakfast. )  . ALPRAZolam (XANAX) 0.5 MG tablet TAKE 1 TABLET(0.5 MG) BY MOUTH THREE TIMES DAILY AS NEEDED FOR ANXIETY  . amoxicillin (AMOXIL) 500 MG capsule Take 4 caps (2gm) by mouth once prior to dental procedure  . azelastine (ASTELIN) 0.1 % nasal spray Place 2 sprays into both nostrils 2 (two) times daily as needed (for seasonal allergies).  Marland Kitchen b complex vitamins tablet Take 1 tablet by mouth daily.   . baclofen (LIORESAL) 10 MG tablet Take 1/2 to 1 by mouth every 8hrs as needed for spasm  . bethanechol (URECHOLINE) 25 MG tablet TAKE 1 TABLET(25 MG) BY MOUTH THREE TIMES DAILY (Patient taking differently: Take 25 mg by mouth 3 (three) times daily. )  . Calcium 500 MG tablet Take 500 mg by mouth daily.   . Cholecalciferol (VITAMIN D3) 2000 UNITS capsule Take 2,000 Units by mouth daily.  . clobetasol (TEMOVATE) 0.05 % GEL Apply small amount to tip of tongue twice daily.  Dry tongue first, then apply with qtip  . Cyanocobalamin (VITAMIN B-12 PO) Take 1 tablet by mouth daily after breakfast.  . diazepam (VALIUM) 5 MG/ML injection   . doxazosin (CARDURA) 2 MG tablet Take by mouth.  . fluorouracil (EFUDEX) 5 % cream Apply topically.  . folic acid (FOLVITE) 1 MG tablet TAKE 2 TABLETS(2 MG) BY MOUTH DAILY  . gabapentin (NEURONTIN) 300 MG capsule TAKE 1 CAPSULE BY MOUTH THREE TIMES DAILY PLUS 2 CAPSULES AS NEEDED  . Magnesium Oxide 200 MG TABS Take 200 mg by mouth daily.   Marland Kitchen MAXITROL 3.5-10000-0.1 OINT Apply 1 Small Amount Both Eyes every evening  . Melatonin 10 MG TABS Take 20 mg by mouth at bedtime.   . Multiple Vitamin (MULTIVITAMIN) tablet Take 1 tablet by mouth daily.   . Omega-3 Fatty Acids (OMEGA-3 FISH OIL PO) Take 1 capsule by mouth daily. EPA/DHA 520/350   . oxyCODONE-acetaminophen (PERCOCET/ROXICET) 5-325 MG tablet Take 0.5-1 tablets by mouth every 6 (six) hours as needed for severe pain.  Vladimir Faster Glyc-Propyl Glyc PF (SYSTANE ULTRA PF) 0.4-0.3 % SOLN Place 1-2 drops into both eyes 3 (three) times daily as needed (for dryness).   . polyethylene glycol (MIRALAX / GLYCOLAX) packet Take 17 g by mouth daily as needed (into 6-8 ounces of water and drink (in conjunction with Metamucil) once a day only when taking Norco).   . Potassium Chloride ER 20 MEQ TBCR Take 20 mEq by mouth daily.  . prednisoLONE acetate (PRED FORTE) 1 % ophthalmic suspension   . predniSONE (DELTASONE) 20 MG tablet May  take 1 tablet daily for up to 3 days as needed for gout attack or muscle spasm  . Psyllium (METAMUCIL PO) Take by mouth See admin instructions. Mix 1 teaspoonful into 6-8 ounces of water and drink (in conjunction with Miralax) once a day only when taking Norco  . Pyridoxine HCl (VITAMIN B-6 PO) Take 1 tablet by mouth daily after breakfast.  . vitamin C (ASCORBIC ACID) 500 MG tablet Take 1,000 mg by mouth daily.   Marland Kitchen zinc gluconate 50 MG tablet Take 50 mg by mouth daily.     Allergies:   Nitroglycerin, Nsaids, Statins, Tolmetin, Ciprofloxacin, Ropinirole, and Requip [ropinirole hcl]   Social History  Socioeconomic History  . Marital status: Widowed    Spouse name: Not on file  . Number of children: Not on file  . Years of education: Not on file  . Highest education level: Not on file  Occupational History  . Not on file  Tobacco Use  . Smoking status: Former Research scientist (life sciences)  . Smokeless tobacco: Never Used  . Tobacco comment: Quit >50 years ago  Vaping Use  . Vaping Use: Never used  Substance and Sexual Activity  . Alcohol use: Yes    Alcohol/week: 1.0 - 4.0 standard drink    Types: 1 - 4 Shots of liquor per week    Comment: 4 glasses per week  . Drug use: No  . Sexual activity: Not on file  Other Topics Concern  . Not on file  Social History Narrative  . Not on file   Social Determinants of Health   Financial Resource Strain: Low Risk   . Difficulty of Paying Living Expenses: Not hard at all  Food Insecurity: No Food Insecurity  . Worried About Charity fundraiser in the Last Year: Never true  . Ran Out of Food in the Last Year: Never true  Transportation Needs: No Transportation Needs  . Lack of Transportation (Medical): No  . Lack of Transportation (Non-Medical): No  Physical Activity:   . Days of Exercise per Week: Not on file  . Minutes of Exercise per Session: Not on file  Stress:   . Feeling of Stress : Not on file  Social Connections:   . Frequency of Communication  with Friends and Family: Not on file  . Frequency of Social Gatherings with Friends and Family: Not on file  . Attends Religious Services: Not on file  . Active Member of Clubs or Organizations: Not on file  . Attends Archivist Meetings: Not on file  . Marital Status: Not on file     Family History: The patient's family history includes Alzheimer's disease in his brother and paternal grandmother; Anuerysm (age of onset: 2) in his mother; Colon cancer in his brother; Colon cancer (age of onset: 81) in his father; Early death in his maternal grandmother; Healthy in his daughter and son; Heart disease in his brother; Obesity in his son and son; Prostate cancer in his brother. There is no history of Diabetes.  ROS:   Please see the history of present illness.     All other systems reviewed and are negative.  EKGs/Labs/Other Studies Reviewed:    The following studies were reviewed today:   EKG:  EKG is ordered today and shows normal sinus rhythm, rate 88, PVC, LVH with repolarization  Stress MPI 03/26/19:  There was no ST segment deviation noted during stress.  No T wave inversion was noted during stress.  Defect 1: There is a large defect of moderate severity present in the basal anteroseptal, basal inferoseptal, basal inferior, basal inferolateral, basal anterolateral, mid inferoseptal, mid inferior, mid inferolateral, apical inferior and apical lateral location.  Findings consistent with prior myocardial infarction.  The left ventricular ejection fraction is mildly decreased (45-54%).  This is a high risk study.  No prior for comparison.   Large, severe fixed defect primarily in inferolateral/inferior walls and extending to adjacent walls, especially at base. Reduced EF with matching wall motion abnormalities. Consistent with infarct. No reversible ischemia seen.  TTE 03/27/19:  1. Left ventricular ejection fraction, by visual estimation, is 60 to 65%. The left  ventricle has normal function. There  is moderately increased left ventricular hypertrophy.  2. Elevated left atrial and left ventricular end-diastolic pressures.  3. Left ventricular diastolic Doppler parameters are consistent with impaired relaxation pattern of LV diastolic filling.  4. Global right ventricle has normal systolic function.The right ventricular size is normal. No increase in right ventricular wall thickness.  5. Left atrial size was moderately dilated.  6. Right atrial size was normal.  7. Moderate mitral annular calcification.  8. The mitral valve is abnormal. Mild mitral valve regurgitation.  9. The tricuspid valve is grossly normal. Tricuspid valve regurgitation is trivial. 10. The aortic valve is tricuspid Aortic valve regurgitation was not visualized by color flow Doppler. Moderate aortic valve stenosis. 11. The pulmonic valve was grossly normal. Pulmonic valve regurgitation is trivial by color flow Doppler.   Recent Labs: 12/02/2019: Pro B Natriuretic peptide (BNP) 1,021.0 12/08/2019: BNP 730.2 01/13/2020: Magnesium 2.1 03/02/2020: ALT 11; BUN 31; Creatinine 1.73; Potassium 3.7; Sodium 142; TSH 1.547 03/09/2020: Hemoglobin 8.7; Platelet Count 65  Recent Lipid Panel    Component Value Date/Time   CHOL 141 03/26/2019 0055   TRIG 69 03/26/2019 0055   HDL 41 03/26/2019 0055   CHOLHDL 3.4 03/26/2019 0055   VLDL 14 03/26/2019 0055   LDLCALC 86 03/26/2019 0055    Physical Exam:    VS:  BP 118/60   Pulse 89   Ht 5\' 10"  (1.778 m)   Wt 190 lb 12.8 oz (86.5 kg)   SpO2 (!) 87%   BMI 27.38 kg/m     Wt Readings from Last 3 Encounters:  03/15/20 190 lb 12.8 oz (86.5 kg)  03/07/20 189 lb (85.7 kg)  03/02/20 186 lb 12 oz (84.7 kg)  SpO2 91% on recheck.  GEN:  in no acute distress HEENT: Normal NECK: + JVD LYMPHATICS: No lymphadenopathy CARDIAC: RRR, 3/6 systolic murmur loudest at RUSB RESPIRATORY: Scattered bibasilar crackles ABDOMEN: Soft, non-tender,  non-distended MUSCULOSKELETAL:  1+ edema SKIN: Warm and dry NEUROLOGIC:  Alert and oriented x 3 PSYCHIATRIC:  Normal affect   ASSESSMENT:    1. Chronic diastolic congestive heart failure ([place])   2. Aortic stenosis, moderate   3. Chronic renal failure, stage 3b    PLAN:     Chronic diastolic heart failure: admission in April 2021 for decompensated heart failure, responded well to IV Lasix.  Discharged on as needed p.o. Lasix.  Worsening symptoms and was started on p.o. Lasix 40 mg daily. Improved with daily Lasix, states that he lost 15 pounds. Now on as needed Lasix -Appears mildly hypervolemic on exam today and reporting shortness of breath, recommend taking Lasix 40 mg daily x3 days.  He is having labs checked tomorrow, will follow up CMP  Aortic stenosis: moderate AS on echo 09/2019, will follow with annual echo  Chest pain:atypical symptoms.  Stress MPI shows fixed defect, mild systolic dysfunction.  TTE showed normal EF, suggesting likely artifact on stress MPI  HTN:not on any agents currently. Blood pressures stable  HLD: developed rhabo while on statin therapy in the past. LDL 85 without current therapy.   DDU:KGUR anemia and thrombocytopenia. Followed by Dr. Marin Olp through the [place] [facility]. On aranesp.  Recently started on Luspatercept for his anemia  CKD IIIb:Cr 1.7 on 03/02/2020.    RTC in 3 months  Medication Adjustments/Labs and Tests Ordered: Current medicines are reviewed at length with the patient today.  Concerns regarding medicines are outlined above.  No orders of the defined types were placed in this encounter.  No orders  of the defined types were placed in this encounter.   Patient Instructions  Medication Instructions:  Take furosemide (Lasix) 40 mg daily for the next 3 days --call on Friday to let us know how you are feeling  *If you need a refill on your cardiac medications before your next appointment, please call your pharmacy*  Follow-Up: At  [facility], you and your health needs are our priority.  As part of our continuing mission to provide you with exceptional heart care, we have created designated Provider Care Teams.  These Care Teams include your primary Cardiologist (physician) and Advanced Practice Providers (APPs -  Physician Assistants and Nurse Practitioners) who all work together to provide you with the care you need, when you need it.  We recommend signing up for the patient portal called "MyChart".  Sign up information is provided on this After Visit Summary.  MyChart is used to connect with patients for Virtual Visits (Telemedicine).  Patients are able to view lab/test results, encounter notes, upcoming appointments, etc.  Non-urgent messages can be sent to your provider as well.   To learn more about what you can do with MyChart, go to [email or web address].    Your next appointment:   3 month(s)  The format for your next appointment:   In Person  Provider:   Oswaldo Milian, MD        Signed, Donato Heinz, MD  03/15/2020 6:05 PM    [place]

## 2020-03-15 ENCOUNTER — Other Ambulatory Visit: Payer: Self-pay

## 2020-03-15 ENCOUNTER — Encounter: Payer: Self-pay | Admitting: Cardiology

## 2020-03-15 ENCOUNTER — Ambulatory Visit (INDEPENDENT_AMBULATORY_CARE_PROVIDER_SITE_OTHER): Payer: Medicare Other | Admitting: Cardiology

## 2020-03-15 ENCOUNTER — Encounter: Payer: Self-pay | Admitting: Family Medicine

## 2020-03-15 VITALS — BP 118/60 | HR 89 | Ht 70.0 in | Wt 190.8 lb

## 2020-03-15 DIAGNOSIS — N1832 Chronic kidney disease, stage 3b: Secondary | ICD-10-CM | POA: Diagnosis not present

## 2020-03-15 DIAGNOSIS — I5032 Chronic diastolic (congestive) heart failure: Secondary | ICD-10-CM

## 2020-03-15 DIAGNOSIS — G894 Chronic pain syndrome: Secondary | ICD-10-CM

## 2020-03-15 DIAGNOSIS — I350 Nonrheumatic aortic (valve) stenosis: Secondary | ICD-10-CM | POA: Diagnosis not present

## 2020-03-15 NOTE — Patient Instructions (Signed)
Medication Instructions:  Take furosemide (Lasix) 40 mg daily for the next 3 days --call on Friday to let us know how you are feeling  *If you need a refill on your cardiac medications before your next appointment, please call your pharmacy*  Follow-Up: At [facility], you and your health needs are our priority.  As part of our continuing mission to provide you with exceptional heart care, we have created designated Provider Care Teams.  These Care Teams include your primary Cardiologist (physician) and Advanced Practice Providers (APPs -  Physician Assistants and Nurse Practitioners) who all work together to provide you with the care you need, when you need it.  We recommend signing up for the patient portal called "MyChart".  Sign up information is provided on this After Visit Summary.  MyChart is used to connect with patients for Virtual Visits (Telemedicine).  Patients are able to view lab/test results, encounter notes, upcoming appointments, etc.  Non-urgent messages can be sent to your provider as well.   To learn more about what you can do with MyChart, go to [email or web address].    Your next appointment:   3 month(s)  The format for your next appointment:   In Person  Provider:   Oswaldo Milian, MD

## 2020-03-16 ENCOUNTER — Other Ambulatory Visit: Payer: Self-pay | Admitting: *Deleted

## 2020-03-16 ENCOUNTER — Inpatient Hospital Stay: Payer: Medicare Other

## 2020-03-16 DIAGNOSIS — T451X5A Adverse effect of antineoplastic and immunosuppressive drugs, initial encounter: Secondary | ICD-10-CM

## 2020-03-16 DIAGNOSIS — D6481 Anemia due to antineoplastic chemotherapy: Secondary | ICD-10-CM | POA: Diagnosis not present

## 2020-03-16 DIAGNOSIS — D649 Anemia, unspecified: Secondary | ICD-10-CM

## 2020-03-16 DIAGNOSIS — R0602 Shortness of breath: Secondary | ICD-10-CM | POA: Diagnosis not present

## 2020-03-16 DIAGNOSIS — C9310 Chronic myelomonocytic leukemia not having achieved remission: Secondary | ICD-10-CM

## 2020-03-16 DIAGNOSIS — R531 Weakness: Secondary | ICD-10-CM | POA: Diagnosis not present

## 2020-03-16 DIAGNOSIS — R5383 Other fatigue: Secondary | ICD-10-CM | POA: Diagnosis not present

## 2020-03-16 LAB — CBC WITH DIFFERENTIAL (CANCER CENTER ONLY)
Abs Immature Granulocytes: 0.63 10*3/uL — ABNORMAL HIGH (ref 0.00–0.07)
Basophils Absolute: 0.1 10*3/uL (ref 0.0–0.1)
Basophils Relative: 1 %
Eosinophils Absolute: 0.1 10*3/uL (ref 0.0–0.5)
Eosinophils Relative: 1 %
HCT: 24.6 % — ABNORMAL LOW (ref 39.0–52.0)
Hemoglobin: 8.1 g/dL — ABNORMAL LOW (ref 13.0–17.0)
Immature Granulocytes: 10 %
Lymphocytes Relative: 19 %
Lymphs Abs: 1.2 10*3/uL (ref 0.7–4.0)
MCH: 37.7 pg — ABNORMAL HIGH (ref 26.0–34.0)
MCHC: 32.9 g/dL (ref 30.0–36.0)
MCV: 114.4 fL — ABNORMAL HIGH (ref 80.0–100.0)
Monocytes Absolute: 1.7 10*3/uL — ABNORMAL HIGH (ref 0.1–1.0)
Monocytes Relative: 26 %
Neutro Abs: 2.8 10*3/uL (ref 1.7–7.7)
Neutrophils Relative %: 43 %
Platelet Count: 49 10*3/uL — ABNORMAL LOW (ref 150–400)
RBC: 2.15 MIL/uL — ABNORMAL LOW (ref 4.22–5.81)
RDW: 22.7 % — ABNORMAL HIGH (ref 11.5–15.5)
WBC Count: 6.5 10*3/uL (ref 4.0–10.5)
nRBC: 2 % — ABNORMAL HIGH (ref 0.0–0.2)

## 2020-03-16 LAB — SAMPLE TO BLOOD BANK

## 2020-03-17 ENCOUNTER — Other Ambulatory Visit: Payer: Self-pay

## 2020-03-17 ENCOUNTER — Inpatient Hospital Stay: Payer: Medicare Other

## 2020-03-17 ENCOUNTER — Other Ambulatory Visit: Payer: Medicare Other

## 2020-03-17 DIAGNOSIS — R5383 Other fatigue: Secondary | ICD-10-CM | POA: Diagnosis not present

## 2020-03-17 DIAGNOSIS — D649 Anemia, unspecified: Secondary | ICD-10-CM

## 2020-03-17 DIAGNOSIS — D6481 Anemia due to antineoplastic chemotherapy: Secondary | ICD-10-CM | POA: Diagnosis not present

## 2020-03-17 DIAGNOSIS — C9310 Chronic myelomonocytic leukemia not having achieved remission: Secondary | ICD-10-CM | POA: Diagnosis not present

## 2020-03-17 DIAGNOSIS — R0602 Shortness of breath: Secondary | ICD-10-CM | POA: Diagnosis not present

## 2020-03-17 DIAGNOSIS — T451X5A Adverse effect of antineoplastic and immunosuppressive drugs, initial encounter: Secondary | ICD-10-CM | POA: Diagnosis not present

## 2020-03-17 DIAGNOSIS — R531 Weakness: Secondary | ICD-10-CM | POA: Diagnosis not present

## 2020-03-17 MED ORDER — SODIUM CHLORIDE 0.9% IV SOLUTION
250.0000 mL | Freq: Once | INTRAVENOUS | Status: AC
  Administered 2020-03-17: 250 mL via INTRAVENOUS
  Filled 2020-03-17: qty 250

## 2020-03-17 MED ORDER — DIPHENHYDRAMINE HCL 25 MG PO CAPS
25.0000 mg | ORAL_CAPSULE | Freq: Once | ORAL | Status: AC
  Administered 2020-03-17: 25 mg via ORAL

## 2020-03-17 MED ORDER — DIPHENHYDRAMINE HCL 25 MG PO CAPS
ORAL_CAPSULE | ORAL | Status: AC
  Filled 2020-03-17: qty 1

## 2020-03-17 MED ORDER — ACETAMINOPHEN 325 MG PO TABS
ORAL_TABLET | ORAL | Status: AC
  Filled 2020-03-17: qty 2

## 2020-03-17 MED ORDER — ACETAMINOPHEN 325 MG PO TABS
650.0000 mg | ORAL_TABLET | Freq: Once | ORAL | Status: AC
  Administered 2020-03-17: 650 mg via ORAL

## 2020-03-18 ENCOUNTER — Telehealth: Payer: Self-pay | Admitting: Family Medicine

## 2020-03-18 ENCOUNTER — Telehealth: Payer: Self-pay | Admitting: *Deleted

## 2020-03-18 LAB — TYPE AND SCREEN
ABO/RH(D): O POS
Antibody Screen: NEGATIVE
Unit division: 0

## 2020-03-18 LAB — BPAM RBC
Blood Product Expiration Date: 202110252359
ISSUE DATE / TIME: 202109230742
Unit Type and Rh: 5100

## 2020-03-18 NOTE — Telephone Encounter (Signed)
Spoke to patient, aware of recommendations.   He states he is not any different than the last 2 weeks and he feels okay right now.   He is going to continue taking the lasix and has labs next week.  If symptoms change or worsen, he will go to ER to be evaluated.     Will call on Monday to follow up

## 2020-03-18 NOTE — Telephone Encounter (Signed)
Called patient to see how he is feeling after OV 9/21 States he continues to be short of breath, last night weight was 186, did not weight this AM.   States O2 this AM was below 80% on 4 different fingers.    Checked while on phone with nurse, 92% at current.   Doesn't feel any different with taking the furosemide.     Infusion at cancer center yesterday- O2 100% States he usually cannot tell any difference or improvement for 3 days after.  He continues to be short of breath walking, states he cannot walk to his car or downstairs to dinner.     No increased swelling, states some mild swelling where his socks are.    States PCP is working on getting him oxygen at home.   Advised would make MD aware and call with any recommendations.

## 2020-03-18 NOTE — Telephone Encounter (Signed)
Caller name: Zacarias Pontes [facility]) Call back number: [phone] Fax # [phone]  Zacarias Pontes states they received an oxygen prescription for the patient. They need a new prescription the following information via nasal cannula, administration, litter flow.   Please send new prescription.

## 2020-03-18 NOTE — Telephone Encounter (Signed)
Pt called back asking for Hayley. Please call the patient back

## 2020-03-18 NOTE — Telephone Encounter (Signed)
-----   Message from Silverio Lay, RN sent at 03/15/2020 11:46 AM EDT ----- Regarding: CALL 9/24 Call for update after OV 9/21

## 2020-03-18 NOTE — Telephone Encounter (Signed)
Left detailed message (ok per DPR) advising if symptoms worsen or change to proceed to ER to be evaluated.

## 2020-03-19 MED ORDER — HYDROCODONE-ACETAMINOPHEN 7.5-325 MG PO TABS: 1.0000 | ORAL_TABLET | Freq: Four times a day (QID) | ORAL | 0 refills | Status: DC | PRN

## 2020-03-19 NOTE — Addendum Note (Signed)
Addended by: Lamar Blinks C on: 03/19/2020 04:44 PM   Modules accepted: Orders

## 2020-03-22 ENCOUNTER — Other Ambulatory Visit: Payer: Medicare Other

## 2020-03-22 ENCOUNTER — Telehealth: Payer: Self-pay | Admitting: Family Medicine

## 2020-03-22 ENCOUNTER — Ambulatory Visit: Payer: Medicare Other | Admitting: Hematology & Oncology

## 2020-03-22 NOTE — Telephone Encounter (Signed)
Caller name: Anderson Malta [facility]) Call back number: [phone] Fax number # [phone]   Anderson Malta from [place] states she received the letters for oxygen, however, in order for insurance to cover few things need to be changed. The letter\order need to state oxygen saturation was at rest, secondly order can not state prn (insurance will not cover for prn).

## 2020-03-22 NOTE — Telephone Encounter (Signed)
Called patient to follow up: states he continues to be short of breath but believes this is related to his cancer.   His O2 a while ago 80-90s he is monitoring this.   Weight 181.   He is suppose to get oxygen this week from PCP and has labs and appt with Dr. Marin Olp this week.   Advised to continue to monitor and call with any questions or concerns.  He is aware to proceed to ER for worsening symptoms or low O2 saturations.

## 2020-03-23 ENCOUNTER — Encounter: Payer: Self-pay | Admitting: Family Medicine

## 2020-03-23 ENCOUNTER — Inpatient Hospital Stay: Payer: Medicare Other

## 2020-03-23 NOTE — Telephone Encounter (Signed)
I made changes to letter- please print out and fax TY

## 2020-03-23 NOTE — Telephone Encounter (Signed)
Refaxed order  

## 2020-03-23 NOTE — Telephone Encounter (Signed)
Patient called to check on oxygen order.  He said supplies were to be delivered yesterday, but they did not come.  I asked him to provide me his O2 stats and he stated they were as low as 77 and as high as 92-94, with deep breathing.  I advised Dr. Lorelei Pont and Conception Oms  of this and per Dr. Lorelei Pont, which I relayed to patient, he should be transported by EMS to the ER with the oxygen stats being 78.  Patient stated he feels ok other than the breathing.  He is going to stay reclined.  He is eating and drinking well and will go to the ER only if necessary as he has a grandson that is a doctor with the hospital system.  He also stated his daughter is staying with him.

## 2020-03-24 ENCOUNTER — Inpatient Hospital Stay (HOSPITAL_BASED_OUTPATIENT_CLINIC_OR_DEPARTMENT_OTHER): Payer: Medicare Other | Admitting: Hematology & Oncology

## 2020-03-24 ENCOUNTER — Inpatient Hospital Stay: Payer: Medicare Other

## 2020-03-24 ENCOUNTER — Encounter: Payer: Self-pay | Admitting: Hematology & Oncology

## 2020-03-24 ENCOUNTER — Other Ambulatory Visit: Payer: Self-pay

## 2020-03-24 VITALS — BP 103/64 | HR 94 | Temp 97.8°F | Resp 19 | Wt 190.0 lb

## 2020-03-24 DIAGNOSIS — C9310 Chronic myelomonocytic leukemia not having achieved remission: Secondary | ICD-10-CM

## 2020-03-24 DIAGNOSIS — D6481 Anemia due to antineoplastic chemotherapy: Secondary | ICD-10-CM | POA: Diagnosis not present

## 2020-03-24 DIAGNOSIS — T451X5A Adverse effect of antineoplastic and immunosuppressive drugs, initial encounter: Secondary | ICD-10-CM | POA: Diagnosis not present

## 2020-03-24 DIAGNOSIS — R531 Weakness: Secondary | ICD-10-CM | POA: Diagnosis not present

## 2020-03-24 DIAGNOSIS — R5383 Other fatigue: Secondary | ICD-10-CM | POA: Diagnosis not present

## 2020-03-24 DIAGNOSIS — R0602 Shortness of breath: Secondary | ICD-10-CM | POA: Diagnosis not present

## 2020-03-24 LAB — SAVE SMEAR(SSMR), FOR PROVIDER SLIDE REVIEW

## 2020-03-24 LAB — RETICULOCYTES
Immature Retic Fract: 31.6 % — ABNORMAL HIGH (ref 2.3–15.9)
RBC.: 2.46 MIL/uL — ABNORMAL LOW (ref 4.22–5.81)
Retic Count, Absolute: 97.4 10*3/uL (ref 19.0–186.0)
Retic Ct Pct: 4 % — ABNORMAL HIGH (ref 0.4–3.1)

## 2020-03-24 LAB — CMP (CANCER CENTER ONLY)
ALT: 13 U/L (ref 0–44)
AST: 16 U/L (ref 15–41)
Albumin: 4 g/dL (ref 3.5–5.0)
Alkaline Phosphatase: 61 U/L (ref 38–126)
Anion gap: 9 (ref 5–15)
BUN: 32 mg/dL — ABNORMAL HIGH (ref 8–23)
CO2: 27 mmol/L (ref 22–32)
Calcium: 9.7 mg/dL (ref 8.9–10.3)
Chloride: 102 mmol/L (ref 98–111)
Creatinine: 1.94 mg/dL — ABNORMAL HIGH (ref 0.61–1.24)
GFR, Est AFR Am: 34 mL/min — ABNORMAL LOW (ref >60)
GFR, Estimated: 30 mL/min — ABNORMAL LOW (ref >60)
Glucose, Bld: 106 mg/dL — ABNORMAL HIGH (ref 70–99)
Potassium: 4.7 mmol/L (ref 3.5–5.1)
Sodium: 138 mmol/L (ref 135–145)
Total Bilirubin: 1.9 mg/dL — ABNORMAL HIGH (ref 0.3–1.2)
Total Protein: 7.2 g/dL (ref 6.5–8.1)

## 2020-03-24 LAB — CBC WITH DIFFERENTIAL (CANCER CENTER ONLY)
Abs Immature Granulocytes: 0.24 10*3/uL — ABNORMAL HIGH (ref 0.00–0.07)
Basophils Absolute: 0.1 10*3/uL (ref 0.0–0.1)
Basophils Relative: 1 %
Eosinophils Absolute: 0.1 10*3/uL (ref 0.0–0.5)
Eosinophils Relative: 1 %
HCT: 27.7 % — ABNORMAL LOW (ref 39.0–52.0)
Hemoglobin: 8.9 g/dL — ABNORMAL LOW (ref 13.0–17.0)
Immature Granulocytes: 5 %
Lymphocytes Relative: 19 %
Lymphs Abs: 1 10*3/uL (ref 0.7–4.0)
MCH: 36.3 pg — ABNORMAL HIGH (ref 26.0–34.0)
MCHC: 32.1 g/dL (ref 30.0–36.0)
MCV: 113.1 fL — ABNORMAL HIGH (ref 80.0–100.0)
Monocytes Absolute: 1.9 10*3/uL — ABNORMAL HIGH (ref 0.1–1.0)
Monocytes Relative: 36 %
Neutro Abs: 2 10*3/uL (ref 1.7–7.7)
Neutrophils Relative %: 38 %
Platelet Count: 41 10*3/uL — ABNORMAL LOW (ref 150–400)
RBC: 2.45 MIL/uL — ABNORMAL LOW (ref 4.22–5.81)
RDW: 26 % — ABNORMAL HIGH (ref 11.5–15.5)
WBC Count: 5.3 10*3/uL (ref 4.0–10.5)
nRBC: 3 % — ABNORMAL HIGH (ref 0.0–0.2)

## 2020-03-24 LAB — SAMPLE TO BLOOD BANK

## 2020-03-24 MED ORDER — LUSPATERCEPT-AAMT 75 MG ~~LOC~~ SOLR
1.0000 mg/kg | Freq: Once | SUBCUTANEOUS | Status: AC
  Administered 2020-03-24: 90 mg via SUBCUTANEOUS
  Filled 2020-03-24: qty 1.5

## 2020-03-24 NOTE — Patient Instructions (Signed)
Luspatercept injection What is this medicine? LUSPATERCEPT (lus PAT er sept) helps your body make more red blood cells. This medicine is used to treat anemia caused by beta thalassemia or myelodysplastic syndromes. This medicine may be used for other purposes; ask your health care provider or pharmacist if you have questions. COMMON BRAND NAME(S): REBLOZYL What should I tell my health care provider before I take this medicine? They need to know if you have any of these conditions:  cigarette smoker  have had your spleen removed  high blood pressure  history of blood clots  an unusual or allergic reaction to luspatercept, other medicines, foods, dyes or preservatives  pregnant or trying to get pregnant  breast-feeding How should I use this medicine? This medicine is for injection under the skin. It is given by a healthcare professional in a hospital or clinic setting. Talk to your pediatrician about the use of the medicine in children. This medicine is not approved for use in children. Overdosage: If you think you have taken too much of this medicine contact a poison control center or emergency room at once. NOTE: This medicine is only for you. Do not share this medicine with others. What if I miss a dose? Keep appointments for follow-up doses. It is important not to miss your dose. Call your doctor or healthcare professional if you are unable to keep an appointment. What may interact with this medicine? Interactions are not expected. This list may not describe all possible interactions. Give your health care provider a list of all the medicines, herbs, non-prescription drugs, or dietary supplements you use. Also tell them if you smoke, drink alcohol, or use illegal drugs. Some items may interact with your medicine. What should I watch for while using this medicine? Your condition will be monitored carefully while you are receiving this medicine. Do not become pregnant while taking  this medicine or for 3 months after stopping it. Women should inform their healthcare professional if they wish to become pregnant or think they might be pregnant. There is a potential for serious side effects and harm to an unborn child. Talk to your healthcare professional for more information. Do not breast-feed an infant while taking this medicine. You may need blood work done while you are taking this medicine. What side effects may I notice from receiving this medicine? Side effects that you should report to your doctor or health care professional as soon as possible:  allergic reactions like skin rash, itching or hives; swelling of the face, lips, or tongue  signs and symptoms of a blood clot such as chest pain; shortness of breath; pain, swelling, or warmth in the leg  signs and symptoms of a stroke like changes in vision; confusion; trouble speaking or understanding; severe headaches; sudden numbness or weakness of the face, arm or leg; trouble walking; dizziness; loss of balance or coordination Side effects that usually do not require medical attention (report these to your doctor or health care professional if they continue or are bothersome):  cough  diarrhea  dizziness  headache  joint pain  stomach pain  tiredness This list may not describe all possible side effects. Call your doctor for medical advice about side effects. You may report side effects to FDA at 1-800-FDA-1088. Where should I keep my medicine? This medicine is given in a hospital or clinic and will not be stored at home. NOTE: This sheet is a summary. It may not cover all possible information. If you have questions about   this medicine, talk to your doctor, pharmacist, or health care provider.  2020 [employer]vier/Gold Standard (2018-09-30 15:57:59)  

## 2020-03-24 NOTE — Progress Notes (Signed)
Hematology and Oncology Follow Up Visit  Jeremiah Walker [id number] 12/18/1927 86 y.o. 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   His daughter comes in with him.  She [place] came [place] from [place]  She has been living there for 30 years.  She is an Nurse, children's.  As such, I really had a great time talking to her about the opera repertoire.  We had a really nice talk.  Jeremiah Walker is actually feeling better.  His hemoglobin is a little bit better today.  Hopefully, the Luspatercept might be helping a little bit.  He does have the renal insufficiency.  I actually may think about trying Procrit on him.  Maybe a different  ESA might give [place] a response.  I am just glad that his quality of life is doing better right now.  His appetite is good.  He has had no nausea or vomiting.  Overall, his performance status is ECOG 2.    Medications:  Allergies as of 03/24/2020      Reactions   Nitroglycerin Other (See Comments)   "I ended up in a fetal position from the pain"   Nsaids Other (See Comments), Tinitus   Bruising   Statins Nausea And Vomiting, Other (See Comments)   Kidney issues and muscle pain   Tolmetin Other (See Comments)   Bruising   Ciprofloxacin Other (See Comments)   Achilles tendon issues   Ropinirole Anxiety, Other (See Comments)   Mood swings, also   Requip [ropinirole Hcl] Anxiety      Medication List       Accurate as of March 24, 2020  4:03 PM. If you have any questions, ask your nurse or doctor.        allopurinol 100 MG tablet Commonly known as: ZYLOPRIM TAKE 2 TABLETS(200 MG) BY MOUTH DAILY What  changed:   how much to take  how to take this  when to take this  additional instructions   ALPRAZolam 0.5 MG tablet Commonly known as: XANAX TAKE 1 TABLET(0.5 MG) BY MOUTH THREE TIMES DAILY AS NEEDED FOR ANXIETY   amoxicillin 500 MG capsule Commonly known as: AMOXIL Take 4 caps (2gm) by mouth once prior to dental procedure   azelastine 0.1 % nasal spray Commonly known as: ASTELIN Place 2 sprays into both nostrils 2 (two) times daily as needed (for seasonal allergies).   b complex vitamins tablet Take 1 tablet by mouth daily.   baclofen 10 MG tablet Commonly known as: LIORESAL Take 1/2 to 1 by mouth every 8hrs as needed for spasm   bethanechol 25 MG tablet Commonly known as: URECHOLINE TAKE 1 TABLET(25 MG) BY MOUTH THREE TIMES DAILY What changed:   how much to take  how to take this  when to take this  additional instructions   Calcium 500 MG tablet Take 500 mg by mouth daily.   clobetasol 0.05 % Gel Commonly known as: TEMOVATE Apply small amount to tip of tongue twice daily.  Dry tongue first, then apply with qtip   diazepam 5 MG/ML injection Commonly known as: VALIUM   doxazosin 2 MG tablet Commonly known as: CARDURA Take by  mouth.   fluorouracil 5 % cream Commonly known as: EFUDEX Apply topically.   folic acid 1 MG tablet Commonly known as: FOLVITE TAKE 2 TABLETS(2 MG) BY MOUTH DAILY   furosemide 40 MG tablet Commonly known as: Lasix Take 1 tablet (40 mg total) by mouth daily as needed for edema.   gabapentin 300 MG capsule Commonly known as: NEURONTIN TAKE 1 CAPSULE BY MOUTH THREE TIMES DAILY PLUS 2 CAPSULES AS NEEDED   HYDROcodone-acetaminophen 7.5-325 MG tablet Commonly known as: NORCO Take 1 tablet by mouth every 6 (six) hours as needed for moderate pain.   Magnesium Oxide 200 MG Tabs Take 200 mg by mouth daily.   Maxitrol 0.1 % Oint Generic drug: neomycin-polymyxin-dexameth Apply 1 Small Amount Both Eyes every evening     Melatonin 10 MG Tabs Take 20 mg by mouth at bedtime.   METAMUCIL PO Take by mouth See admin instructions. Mix 1 teaspoonful into 6-8 ounces of water and drink (in conjunction with Miralax) once a day only when taking Norco   multivitamin tablet Take 1 tablet by mouth daily.   OMEGA-3 FISH OIL PO Take 1 capsule by mouth daily. EPA/DHA 520/350   polyethylene glycol 17 g packet Commonly known as: MIRALAX / GLYCOLAX Take 17 g by mouth daily as needed (into 6-8 ounces of water and drink (in conjunction with Metamucil) once a day only when taking Norco).   Potassium Chloride ER 20 MEQ Tbcr Take 20 mEq by mouth daily.   prednisoLONE acetate 1 % ophthalmic suspension Commonly known as: PRED FORTE   predniSONE 20 MG tablet Commonly known as: DELTASONE May take 1 tablet daily for up to 3 days as needed for gout attack or muscle spasm   Systane Ultra PF 0.4-0.3 % Soln Generic drug: Polyethyl Glyc-Propyl Glyc PF Place 1-2 drops into both eyes 3 (three) times daily as needed (for dryness).   VITAMIN B-12 PO Take 1 tablet by mouth daily after breakfast.   VITAMIN B-6 PO Take 1 tablet by mouth daily after breakfast.   vitamin C 500 MG tablet Commonly known as: ASCORBIC ACID Take 1,000 mg by mouth daily.   Vitamin D3 50 MCG (2000 UT) capsule Take 2,000 Units by mouth daily.   zinc gluconate 50 MG tablet Take 50 mg by mouth daily.       Allergies:  Allergies  Allergen Reactions  . Nitroglycerin Other (See Comments)    "I ended up in a fetal position from the pain"  . Nsaids Other (See Comments) and Tinitus    Bruising   . Statins Nausea And Vomiting and Other (See Comments)    Kidney issues and muscle pain   . Tolmetin Other (See Comments)    Bruising  . Ciprofloxacin Other (See Comments)    Achilles tendon issues  . Ropinirole Anxiety and Other (See Comments)    Mood swings, also  . Requip [Ropinirole Hcl] Anxiety    Past Medical History, Surgical history,  Social history, and Family History were reviewed and updated.  Review of Systems: Review of Systems  Constitutional: Positive for malaise/fatigue and weight loss.  HENT: Negative.   Eyes: Negative.   Respiratory: Positive for shortness of breath.   Cardiovascular: Negative.   Gastrointestinal: Negative.   Genitourinary: Negative.   Musculoskeletal: Negative.   Skin: Negative.   Neurological: Positive for weakness.  Endo/Heme/Allergies: Negative.   Psychiatric/Behavioral: Negative.       Physical Exam:  weight is 190 lb (86.2 kg). His oral temperature is  97.8 F (36.6 C). His blood pressure is 103/64 and his pulse is 94. His respiration is 19 and oxygen saturation is 97%.   Wt Readings from Last 3 Encounters:  03/24/20 190 lb (86.2 kg)  03/15/20 190 lb 12.8 oz (86.5 kg)  03/07/20 189 lb (85.7 kg)    Physical Exam Vitals reviewed.  HENT:     Head: Normocephalic and atraumatic.  Eyes:     Pupils: Pupils are equal, round, and reactive to light.  Cardiovascular:     Rate and Rhythm: Normal rate and regular rhythm.     Heart sounds: Normal heart sounds.  Pulmonary:     Effort: Pulmonary effort is normal.     Breath sounds: Normal breath sounds.  Abdominal:     General: Bowel sounds are normal.     Palpations: Abdomen is soft.  Musculoskeletal:        General: No tenderness or deformity. Normal range of motion.     Cervical back: Normal range of motion.  Lymphadenopathy:     Cervical: No cervical adenopathy.  Skin:    General: Skin is warm and dry.     Findings: No erythema or rash.  Neurological:     Mental Status: He is alert and oriented to person, place, and time.  Psychiatric:        Behavior: Behavior normal.        Thought Content: Thought content normal.        Judgment: Judgment normal.      Lab Results  Component Value Date   WBC 5.3 03/24/2020   HGB 8.9 (L) 03/24/2020   HCT 27.7 (L) 03/24/2020   MCV 113.1 (H) 03/24/2020   PLT 41 (L) 03/24/2020    Lab Results  Component Value Date   FERRITIN 753 (H) 03/02/2020   IRON 87 03/02/2020   TIBC 256 03/02/2020   UIBC 170 03/02/2020   IRONPCTSAT 34 03/02/2020   Lab Results  Component Value Date   RETICCTPCT 4.0 (H) 03/24/2020   RBC 2.45 (L) 03/24/2020   RBC 2.46 (L) 03/24/2020   No results found for: KPAFRELGTCHN, LAMBDASER, KAPLAMBRATIO No results found for: Kandis Cocking, IGMSERUM No results found for: Odetta Pink, SPEI   Chemistry      Component Value Date/Time   NA 138 03/24/2020 1329   NA 143 01/13/2020 1207   NA 139 05/08/2017 1123   NA 141 05/15/2016 1126   K 4.7 03/24/2020 1329   K 4.0 05/08/2017 1123   K 4.3 05/15/2016 1126   CL 102 03/24/2020 1329   CL 105 05/08/2017 1123   CO2 27 03/24/2020 1329   CO2 30 05/08/2017 1123   CO2 26 05/15/2016 1126   BUN 32 (H) 03/24/2020 1329   BUN 26 01/13/2020 1207   BUN 15 05/08/2017 1123   BUN 29.1 (H) 05/15/2016 1126   CREATININE 1.94 (H) 03/24/2020 1329   CREATININE 1.2 05/08/2017 1123   CREATININE 1.7 (H) 05/15/2016 1126   GLU 90 11/15/2014 0000      Component Value Date/Time   CALCIUM 9.7 03/24/2020 1329   CALCIUM 9.1 05/08/2017 1123   CALCIUM 9.7 05/15/2016 1126   ALKPHOS 61 03/24/2020 1329   ALKPHOS 64 05/08/2017 1123   ALKPHOS 69 05/15/2016 1126   AST 16 03/24/2020 1329   AST 23 05/15/2016 1126   ALT 13 03/24/2020 1329   ALT 19 05/08/2017 1123   ALT 22 05/15/2016 1126   BILITOT 1.9 (H) 03/24/2020  1329   BILITOT 0.84 05/15/2016 1126       Impression and Plan: Jeremiah Walker a 86yo caucasian gentleman withhistory of diffuse large cell non-Hodgkin's lymphomatreated with6 cycles of R-CHOP completed in April 2014.   He will get his Luspatercept today.  I will see what his blood counts are the next time.  Again, we might think about Procrit for him.  We will plan to get him back here in another 3 weeks.  Hopefully his hemoglobin will be above  9.  I would be nice we just could hold off on transfusing him.     [name], MD 9/30/20214:03 PM

## 2020-03-25 ENCOUNTER — Telehealth: Payer: Self-pay | Admitting: Hematology & Oncology

## 2020-03-25 ENCOUNTER — Other Ambulatory Visit: Payer: Self-pay | Admitting: Hematology & Oncology

## 2020-03-25 LAB — FERRITIN: Ferritin: 777 ng/mL — ABNORMAL HIGH (ref 24–336)

## 2020-03-25 LAB — IRON AND TIBC
Iron: 138 ug/dL (ref 42–163)
Saturation Ratios: 54 % (ref 20–55)
TIBC: 256 ug/dL (ref 202–409)
UIBC: 118 ug/dL (ref 117–376)

## 2020-03-25 NOTE — Telephone Encounter (Signed)
Appointments scheduled calendar printed & mailed per 9/30 los

## 2020-03-26 ENCOUNTER — Encounter: Payer: Self-pay | Admitting: Family Medicine

## 2020-03-28 ENCOUNTER — Encounter: Payer: Self-pay | Admitting: Family Medicine

## 2020-03-28 NOTE — Telephone Encounter (Signed)
[facility] has a big green tank only He needs a portable concentrator if possible so he can take it and move around/ go to dinner, etc Will call them tomorrow and try to help

## 2020-03-28 NOTE — Telephone Encounter (Signed)
Patient states he use [employer], phone number [phone]

## 2020-03-28 NOTE — Telephone Encounter (Signed)
Patient called to reiterate his same feelings he expressed in his mychart message to PCP this afternoon regarding the oxygen machine he received and the need for portable oxygen.  He did tell me the company was going to be bringing him a "newer version" of what he currently has sometime today.

## 2020-03-29 ENCOUNTER — Encounter: Payer: Self-pay | Admitting: Family Medicine

## 2020-03-30 ENCOUNTER — Encounter: Payer: Self-pay | Admitting: Family Medicine

## 2020-03-31 ENCOUNTER — Ambulatory Visit: Payer: Medicare Other | Admitting: Hematology & Oncology

## 2020-03-31 ENCOUNTER — Other Ambulatory Visit: Payer: Medicare Other

## 2020-04-04 ENCOUNTER — Other Ambulatory Visit: Payer: Self-pay

## 2020-04-04 ENCOUNTER — Inpatient Hospital Stay (HOSPITAL_COMMUNITY)
Admission: EM | Admit: 2020-04-04 | Discharge: 2020-04-10 | DRG: 840 | Disposition: A | Discharge status: Skilled Nursing Facility | Payer: Medicare Other | Source: Skilled Nursing Facility | Attending: Student | Admitting: Student

## 2020-04-04 ENCOUNTER — Emergency Department (HOSPITAL_COMMUNITY): Payer: Medicare Other

## 2020-04-04 ENCOUNTER — Encounter (HOSPITAL_COMMUNITY): Payer: Self-pay | Admitting: Emergency Medicine

## 2020-04-04 DIAGNOSIS — I5043 Acute on chronic combined systolic (congestive) and diastolic (congestive) heart failure: Secondary | ICD-10-CM | POA: Diagnosis present

## 2020-04-04 DIAGNOSIS — R5381 Other malaise: Secondary | ICD-10-CM | POA: Diagnosis not present

## 2020-04-04 DIAGNOSIS — F419 Anxiety disorder, unspecified: Secondary | ICD-10-CM | POA: Diagnosis not present

## 2020-04-04 DIAGNOSIS — D649 Anemia, unspecified: Secondary | ICD-10-CM | POA: Diagnosis present

## 2020-04-04 DIAGNOSIS — D630 Anemia in neoplastic disease: Secondary | ICD-10-CM | POA: Diagnosis not present

## 2020-04-04 DIAGNOSIS — I491 Atrial premature depolarization: Secondary | ICD-10-CM | POA: Diagnosis not present

## 2020-04-04 DIAGNOSIS — G629 Polyneuropathy, unspecified: Secondary | ICD-10-CM | POA: Diagnosis present

## 2020-04-04 DIAGNOSIS — D6959 Other secondary thrombocytopenia: Secondary | ICD-10-CM | POA: Diagnosis present

## 2020-04-04 DIAGNOSIS — J9611 Chronic respiratory failure with hypoxia: Secondary | ICD-10-CM | POA: Diagnosis not present

## 2020-04-04 DIAGNOSIS — C9310 Chronic myelomonocytic leukemia not having achieved remission: Principal | ICD-10-CM | POA: Diagnosis present

## 2020-04-04 DIAGNOSIS — I350 Nonrheumatic aortic (valve) stenosis: Secondary | ICD-10-CM | POA: Diagnosis present

## 2020-04-04 DIAGNOSIS — Z9841 Cataract extraction status, right eye: Secondary | ICD-10-CM | POA: Diagnosis not present

## 2020-04-04 DIAGNOSIS — Z79890 Hormone replacement therapy (postmenopausal): Secondary | ICD-10-CM

## 2020-04-04 DIAGNOSIS — D631 Anemia in chronic kidney disease: Secondary | ICD-10-CM | POA: Diagnosis present

## 2020-04-04 DIAGNOSIS — M109 Gout, unspecified: Secondary | ICD-10-CM | POA: Diagnosis present

## 2020-04-04 DIAGNOSIS — Z79899 Other long term (current) drug therapy: Secondary | ICD-10-CM

## 2020-04-04 DIAGNOSIS — I472 Ventricular tachycardia: Secondary | ICD-10-CM | POA: Diagnosis not present

## 2020-04-04 DIAGNOSIS — D469 Myelodysplastic syndrome, unspecified: Secondary | ICD-10-CM | POA: Diagnosis present

## 2020-04-04 DIAGNOSIS — I5041 Acute combined systolic (congestive) and diastolic (congestive) heart failure: Secondary | ICD-10-CM

## 2020-04-04 DIAGNOSIS — Y92239 Unspecified place in hospital as the place of occurrence of the external cause: Secondary | ICD-10-CM | POA: Diagnosis not present

## 2020-04-04 DIAGNOSIS — Z800 Family history of malignant neoplasm of digestive organs: Secondary | ICD-10-CM

## 2020-04-04 DIAGNOSIS — D638 Anemia in other chronic diseases classified elsewhere: Secondary | ICD-10-CM | POA: Diagnosis present

## 2020-04-04 DIAGNOSIS — I248 Other forms of acute ischemic heart disease: Secondary | ICD-10-CM | POA: Diagnosis present

## 2020-04-04 DIAGNOSIS — R531 Weakness: Secondary | ICD-10-CM | POA: Diagnosis not present

## 2020-04-04 DIAGNOSIS — K219 Gastro-esophageal reflux disease without esophagitis: Secondary | ICD-10-CM | POA: Diagnosis present

## 2020-04-04 DIAGNOSIS — D696 Thrombocytopenia, unspecified: Secondary | ICD-10-CM | POA: Diagnosis not present

## 2020-04-04 DIAGNOSIS — H9311 Tinnitus, right ear: Secondary | ICD-10-CM | POA: Diagnosis not present

## 2020-04-04 DIAGNOSIS — J9610 Chronic respiratory failure, unspecified whether with hypoxia or hypercapnia: Secondary | ICD-10-CM | POA: Diagnosis present

## 2020-04-04 DIAGNOSIS — M10271 Drug-induced gout, right ankle and foot: Secondary | ICD-10-CM | POA: Diagnosis not present

## 2020-04-04 DIAGNOSIS — Z9181 History of falling: Secondary | ICD-10-CM | POA: Diagnosis not present

## 2020-04-04 DIAGNOSIS — F32A Depression, unspecified: Secondary | ICD-10-CM | POA: Diagnosis not present

## 2020-04-04 DIAGNOSIS — Z7401 Bed confinement status: Secondary | ICD-10-CM | POA: Diagnosis not present

## 2020-04-04 DIAGNOSIS — Z9842 Cataract extraction status, left eye: Secondary | ICD-10-CM | POA: Diagnosis not present

## 2020-04-04 DIAGNOSIS — Z96643 Presence of artificial hip joint, bilateral: Secondary | ICD-10-CM | POA: Diagnosis present

## 2020-04-04 DIAGNOSIS — I5033 Acute on chronic diastolic (congestive) heart failure: Secondary | ICD-10-CM | POA: Diagnosis not present

## 2020-04-04 DIAGNOSIS — E785 Hyperlipidemia, unspecified: Secondary | ICD-10-CM | POA: Diagnosis not present

## 2020-04-04 DIAGNOSIS — N390 Urinary tract infection, site not specified: Secondary | ICD-10-CM

## 2020-04-04 DIAGNOSIS — I130 Hypertensive heart and chronic kidney disease with heart failure and stage 1 through stage 4 chronic kidney disease, or unspecified chronic kidney disease: Secondary | ICD-10-CM | POA: Diagnosis present

## 2020-04-04 DIAGNOSIS — I429 Cardiomyopathy, unspecified: Secondary | ICD-10-CM | POA: Diagnosis present

## 2020-04-04 DIAGNOSIS — D6481 Anemia due to antineoplastic chemotherapy: Secondary | ICD-10-CM

## 2020-04-04 DIAGNOSIS — Z20822 Contact with and (suspected) exposure to covid-19: Secondary | ICD-10-CM | POA: Diagnosis present

## 2020-04-04 DIAGNOSIS — N1832 Chronic kidney disease, stage 3b: Secondary | ICD-10-CM | POA: Diagnosis present

## 2020-04-04 DIAGNOSIS — T502X5A Adverse effect of carbonic-anhydrase inhibitors, benzothiadiazides and other diuretics, initial encounter: Secondary | ICD-10-CM | POA: Diagnosis not present

## 2020-04-04 DIAGNOSIS — N179 Acute kidney failure, unspecified: Secondary | ICD-10-CM | POA: Diagnosis not present

## 2020-04-04 DIAGNOSIS — Z87891 Personal history of nicotine dependence: Secondary | ICD-10-CM

## 2020-04-04 DIAGNOSIS — I951 Orthostatic hypotension: Secondary | ICD-10-CM | POA: Diagnosis not present

## 2020-04-04 DIAGNOSIS — N183 Chronic kidney disease, stage 3 unspecified: Secondary | ICD-10-CM | POA: Diagnosis present

## 2020-04-04 DIAGNOSIS — R0602 Shortness of breath: Secondary | ICD-10-CM | POA: Diagnosis not present

## 2020-04-04 DIAGNOSIS — Z9981 Dependence on supplemental oxygen: Secondary | ICD-10-CM | POA: Diagnosis not present

## 2020-04-04 DIAGNOSIS — Z66 Do not resuscitate: Secondary | ICD-10-CM | POA: Diagnosis present

## 2020-04-04 DIAGNOSIS — Z888 Allergy status to other drugs, medicaments and biological substances status: Secondary | ICD-10-CM

## 2020-04-04 DIAGNOSIS — R609 Edema, unspecified: Secondary | ICD-10-CM | POA: Diagnosis not present

## 2020-04-04 DIAGNOSIS — Z8042 Family history of malignant neoplasm of prostate: Secondary | ICD-10-CM

## 2020-04-04 DIAGNOSIS — M2550 Pain in unspecified joint: Secondary | ICD-10-CM | POA: Diagnosis not present

## 2020-04-04 DIAGNOSIS — I493 Ventricular premature depolarization: Secondary | ICD-10-CM | POA: Diagnosis present

## 2020-04-04 DIAGNOSIS — T451X5A Adverse effect of antineoplastic and immunosuppressive drugs, initial encounter: Secondary | ICD-10-CM | POA: Diagnosis present

## 2020-04-04 DIAGNOSIS — R778 Other specified abnormalities of plasma proteins: Secondary | ICD-10-CM | POA: Diagnosis not present

## 2020-04-04 DIAGNOSIS — Z8249 Family history of ischemic heart disease and other diseases of the circulatory system: Secondary | ICD-10-CM

## 2020-04-04 DIAGNOSIS — R0902 Hypoxemia: Secondary | ICD-10-CM | POA: Diagnosis not present

## 2020-04-04 DIAGNOSIS — Z820 Family history of epilepsy and other diseases of the nervous system: Secondary | ICD-10-CM

## 2020-04-04 DIAGNOSIS — E876 Hypokalemia: Secondary | ICD-10-CM | POA: Diagnosis present

## 2020-04-04 DIAGNOSIS — C8330 Diffuse large B-cell lymphoma, unspecified site: Secondary | ICD-10-CM | POA: Diagnosis not present

## 2020-04-04 LAB — CBC WITH DIFFERENTIAL/PLATELET
Abs Immature Granulocytes: 0.87 10*3/uL — ABNORMAL HIGH (ref 0.00–0.07)
Basophils Absolute: 0.1 10*3/uL (ref 0.0–0.1)
Basophils Relative: 1 %
Eosinophils Absolute: 0 10*3/uL (ref 0.0–0.5)
Eosinophils Relative: 0 %
HCT: 24.6 % — ABNORMAL LOW (ref 39.0–52.0)
Hemoglobin: 7.8 g/dL — ABNORMAL LOW (ref 13.0–17.0)
Immature Granulocytes: 12 %
Lymphocytes Relative: 11 %
Lymphs Abs: 0.8 10*3/uL (ref 0.7–4.0)
MCH: 38.8 pg — ABNORMAL HIGH (ref 26.0–34.0)
MCHC: 31.7 g/dL (ref 30.0–36.0)
MCV: 122.4 fL — ABNORMAL HIGH (ref 80.0–100.0)
Monocytes Absolute: 2.1 10*3/uL — ABNORMAL HIGH (ref 0.1–1.0)
Monocytes Relative: 29 %
Neutro Abs: 3.5 10*3/uL (ref 1.7–7.7)
Neutrophils Relative %: 47 %
Platelets: 24 10*3/uL — CL (ref 150–400)
RBC: 2.01 MIL/uL — ABNORMAL LOW (ref 4.22–5.81)
RDW: 27.9 % — ABNORMAL HIGH (ref 11.5–15.5)
WBC: 7.3 10*3/uL (ref 4.0–10.5)
nRBC: 8.6 % — ABNORMAL HIGH (ref 0.0–0.2)

## 2020-04-04 LAB — COMPREHENSIVE METABOLIC PANEL
ALT: 22 U/L (ref 0–44)
AST: 25 U/L (ref 15–41)
Albumin: 3.3 g/dL — ABNORMAL LOW (ref 3.5–5.0)
Alkaline Phosphatase: 63 U/L (ref 38–126)
Anion gap: 14 (ref 5–15)
BUN: 53 mg/dL — ABNORMAL HIGH (ref 8–23)
CO2: 22 mmol/L (ref 22–32)
Calcium: 9.2 mg/dL (ref 8.9–10.3)
Chloride: 103 mmol/L (ref 98–111)
Creatinine, Ser: 2.57 mg/dL — ABNORMAL HIGH (ref 0.61–1.24)
GFR, Estimated: 21 mL/min — ABNORMAL LOW (ref >60)
Glucose, Bld: 140 mg/dL — ABNORMAL HIGH (ref 70–99)
Potassium: 3.9 mmol/L (ref 3.5–5.1)
Sodium: 139 mmol/L (ref 135–145)
Total Bilirubin: 2.3 mg/dL — ABNORMAL HIGH (ref 0.3–1.2)
Total Protein: 6.4 g/dL — ABNORMAL LOW (ref 6.5–8.1)

## 2020-04-04 LAB — URINALYSIS, ROUTINE W REFLEX MICROSCOPIC
Bilirubin Urine: NEGATIVE
Glucose, UA: NEGATIVE mg/dL
Ketones, ur: NEGATIVE mg/dL
Nitrite: NEGATIVE
Protein, ur: 30 mg/dL — AB
Specific Gravity, Urine: 1.017 (ref 1.005–1.030)
pH: 5 (ref 5.0–8.0)

## 2020-04-04 LAB — TROPONIN I (HIGH SENSITIVITY)
Troponin I (High Sensitivity): 302 ng/L (ref <18)
Troponin I (High Sensitivity): 301 ng/L (ref <18)

## 2020-04-04 LAB — BRAIN NATRIURETIC PEPTIDE: B Natriuretic Peptide: 1658 pg/mL — ABNORMAL HIGH (ref 0.0–100.0)

## 2020-04-04 LAB — RESPIRATORY PANEL BY RT PCR (FLU A&B, COVID)
Influenza A by PCR: NEGATIVE
Influenza B by PCR: NEGATIVE
SARS Coronavirus 2 by RT PCR: NEGATIVE

## 2020-04-04 LAB — RETICULOCYTES
Immature Retic Fract: 36 % — ABNORMAL HIGH (ref 2.3–15.9)
RBC.: 1.94 MIL/uL — ABNORMAL LOW (ref 4.22–5.81)
Retic Count, Absolute: 99.3 10*3/uL (ref 19.0–186.0)
Retic Ct Pct: 5.1 % — ABNORMAL HIGH (ref 0.4–3.1)

## 2020-04-04 LAB — PREPARE RBC (CROSSMATCH)

## 2020-04-04 LAB — MAGNESIUM: Magnesium: 2.1 mg/dL (ref 1.7–2.4)

## 2020-04-04 LAB — LACTIC ACID, PLASMA: Lactic Acid, Venous: 2.1 mmol/L (ref 0.5–1.9)

## 2020-04-04 MED ORDER — POLYVINYL ALCOHOL 1.4 % OP SOLN
1.0000 [drp] | Freq: Three times a day (TID) | OPHTHALMIC | Status: DC | PRN
  Filled 2020-04-04: qty 15

## 2020-04-04 MED ORDER — SODIUM CHLORIDE 0.9 % IV SOLN
1.0000 g | Freq: Once | INTRAVENOUS | Status: AC
  Administered 2020-04-04: 1 g via INTRAVENOUS
  Filled 2020-04-04: qty 10

## 2020-04-04 MED ORDER — ONDANSETRON HCL 4 MG PO TABS: 4.0000 mg | ORAL_TABLET | Freq: Four times a day (QID) | ORAL | Status: DC | PRN

## 2020-04-04 MED ORDER — FUROSEMIDE 10 MG/ML IJ SOLN
60.0000 mg | Freq: Every day | INTRAMUSCULAR | Status: DC
  Administered 2020-04-05 – 2020-04-07 (×3): 60 mg via INTRAVENOUS
  Filled 2020-04-04 (×3): qty 6

## 2020-04-04 MED ORDER — ACETAMINOPHEN 650 MG RE SUPP: 650.0000 mg | Freq: Four times a day (QID) | RECTAL | Status: DC | PRN

## 2020-04-04 MED ORDER — GABAPENTIN 300 MG PO CAPS
300.0000 mg | ORAL_CAPSULE | Freq: Three times a day (TID) | ORAL | Status: DC
  Administered 2020-04-04 – 2020-04-05 (×2): 300 mg via ORAL
  Filled 2020-04-04 (×2): qty 1

## 2020-04-04 MED ORDER — VITAMIN D 25 MCG (1000 UNIT) PO TABS
2000.0000 [IU] | ORAL_TABLET | Freq: Every day | ORAL | Status: DC
  Administered 2020-04-04 – 2020-04-10 (×7): 2000 [IU] via ORAL
  Filled 2020-04-04 (×7): qty 2

## 2020-04-04 MED ORDER — ALPRAZOLAM 0.5 MG PO TABS
0.5000 mg | ORAL_TABLET | Freq: Three times a day (TID) | ORAL | Status: DC | PRN
  Administered 2020-04-05 – 2020-04-09 (×6): 0.5 mg via ORAL
  Filled 2020-04-04 (×5): qty 1
  Filled 2020-04-04 (×2): qty 2

## 2020-04-04 MED ORDER — CALCIUM CARBONATE 1250 (500 CA) MG PO TABS
1250.0000 mg | ORAL_TABLET | Freq: Every day | ORAL | Status: DC
  Administered 2020-04-05 – 2020-04-10 (×6): 1250 mg via ORAL
  Filled 2020-04-04 (×7): qty 1

## 2020-04-04 MED ORDER — ACETAMINOPHEN 325 MG PO TABS
650.0000 mg | ORAL_TABLET | Freq: Four times a day (QID) | ORAL | Status: DC | PRN
  Administered 2020-04-08: 650 mg via ORAL
  Filled 2020-04-04: qty 2

## 2020-04-04 MED ORDER — FUROSEMIDE 10 MG/ML IJ SOLN
40.0000 mg | Freq: Once | INTRAMUSCULAR | Status: AC
  Administered 2020-04-04: 40 mg via INTRAVENOUS
  Filled 2020-04-04: qty 4

## 2020-04-04 MED ORDER — FUROSEMIDE 10 MG/ML IJ SOLN: 40.0000 mg | Freq: Every day | INTRAMUSCULAR | Status: DC

## 2020-04-04 MED ORDER — ONDANSETRON HCL 4 MG/2ML IJ SOLN: 4.0000 mg | Freq: Four times a day (QID) | INTRAMUSCULAR | Status: DC | PRN

## 2020-04-04 MED ORDER — MELATONIN 5 MG PO TABS
10.0000 mg | ORAL_TABLET | Freq: Every day | ORAL | Status: DC
  Administered 2020-04-04 – 2020-04-09 (×6): 10 mg via ORAL
  Filled 2020-04-04 (×7): qty 2

## 2020-04-04 MED ORDER — HYDROCODONE-ACETAMINOPHEN 7.5-325 MG PO TABS
1.0000 | ORAL_TABLET | Freq: Four times a day (QID) | ORAL | Status: DC | PRN
  Administered 2020-04-08: 1 via ORAL
  Filled 2020-04-04 (×2): qty 1

## 2020-04-04 MED ORDER — FUROSEMIDE 10 MG/ML IJ SOLN
20.0000 mg | Freq: Once | INTRAMUSCULAR | Status: AC
  Administered 2020-04-04: 20 mg via INTRAVENOUS
  Filled 2020-04-04: qty 2

## 2020-04-04 MED ORDER — ZINC SULFATE 220 (50 ZN) MG PO CAPS
220.0000 mg | ORAL_CAPSULE | Freq: Every day | ORAL | Status: DC
  Administered 2020-04-04 – 2020-04-10 (×7): 220 mg via ORAL
  Filled 2020-04-04 (×7): qty 1

## 2020-04-04 MED ORDER — POLYETHYLENE GLYCOL 3350 17 G PO PACK: 17.0000 g | PACK | Freq: Every day | ORAL | Status: DC | PRN

## 2020-04-04 MED ORDER — SODIUM CHLORIDE 0.9% IV SOLUTION: Freq: Once | INTRAVENOUS | Status: AC

## 2020-04-04 MED ORDER — FOLIC ACID 1 MG PO TABS
1.0000 mg | ORAL_TABLET | Freq: Every day | ORAL | Status: DC
  Administered 2020-04-04: 1 mg via ORAL
  Filled 2020-04-04 (×2): qty 1

## 2020-04-04 MED ORDER — ASCORBIC ACID 500 MG PO TABS
1000.0000 mg | ORAL_TABLET | Freq: Every day | ORAL | Status: DC
  Administered 2020-04-04 – 2020-04-10 (×7): 1000 mg via ORAL
  Filled 2020-04-04 (×7): qty 2

## 2020-04-04 NOTE — ED Triage Notes (Signed)
Pt BIB [facility] from home, c/o uncontrolled tremors that started this morning, also reports increased shortness of breath and bilateral lower extremity edema. Hx CHF and leukemia. Recently started on home O2. EMS VS: BP 102/78, HR 90, SpO2 100% 3L [place].

## 2020-04-04 NOTE — ED Notes (Signed)
External catheter applied, pt states may be needed d/t lasix IV, otherwise would be continent at home. Cardiology at bedside at this time

## 2020-04-04 NOTE — ED Provider Notes (Signed)
[facility] EMERGENCY DEPARTMENT Provider Note   CSN: [id number] Arrival date & time: 04/04/20  1739     History Chief Complaint  Patient presents with  . Tremors    ERSKINE STEINFELDT is a 86 y.o. male.  He is brought in by EMS for shaking in his legs and arms that started around 4 AM this morning.  Since he had an episode of this a month ago when his electrolytes were off.  His son is here and is also noticed that he has been a little bit more forgetful recently.  Had a fall few weeks ago.  Is been progressively weaker when ambulating and can now can only go up the steps.  Feels dizzy at times.  No headache chest pain abdominal pain vomiting diarrhea or urinary symptoms.  Gets short of breath with exertion.  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  Follows with Dr. Marin Olp.  Has some swelling in the legs and sometimes takes Lasix.  Has been vaccinated for Covid.   The history is provided by the patient and a relative.  Weakness Severity:  Moderate Onset quality:  Gradual Timing:  Constant Progression:  Worsening Chronicity:  New Relieved by:  Nothing Worsened by:  Activity Ineffective treatments:  None tried Associated symptoms: difficulty walking, dizziness, extremity numbness (neuropathy), falls and shortness of breath   Associated symptoms: no abdominal pain, no chest pain, no cough, no diarrhea, no fever, no foul-smelling urine, no frequency, no headaches, no loss of consciousness, no nausea, no stroke symptoms, no syncope and no vomiting   Risk factors: anemia        Past Medical History:  Diagnosis Date  . Acute exacerbation of CHF (congestive heart failure) ([place]) 09/25/2019  . Anemia   . Anemia due to antineoplastic chemotherapy 08/15/2015  . Anxiety   . Arthritis   . Cataract   . Chronic kidney disease (CKD), stage III (moderate) (H[facility]) 02/09/2015   Controlled  . CMML (chronic myelomonocytic leukemia) ([place])    gets Aranesp about  monthly, otherwise surveillance with Dr. Marin Olp  . Combined fat and carbohydrate induced hyperlipemia 02/09/2015   Controlled  . Depression   . DLBCL (diffuse large B cell lymphoma) ([place]) 08/15/2015  . Essential (primary) hypertension 02/09/2015  . Gastro-esophageal reflux disease without esophagitis 02/09/2015   Controlled  . Renal insufficiency   . Tinnitus     Patient Active Problem List   Diagnosis Date Noted  . Acute exacerbation of CHF (congestive heart failure) ([place]) 09/25/2019  . Elevated troponin 09/25/2019  . Spondylosis without myelopathy or radiculopathy, lumbar region 08/12/2019  . Spinal stenosis of lumbar region with neurogenic claudication 08/12/2019  . Myofascial pain syndrome 08/12/2019  . Chest pain 03/26/2019  . Chronic myelomonocytic leukemia not having achieved remission ([place]) 12/04/2016  . Chronic pain syndrome 09/26/2015  . De Quervain's tenosynovitis, right 09/13/2015  . Anemia due to antineoplastic chemotherapy 08/15/2015  . DLBCL (diffuse large B cell lymphoma) ([place]) 08/15/2015  . Skin lesion of face 05/21/2015  . Peripheral neuropathy due to chemotherapy ([place]) 02/09/2015  . Benign prostatic hyperplasia with urinary obstruction 02/09/2015  . Bilateral hearing loss 02/09/2015  . Low back pain 02/09/2015  . CKD (chronic kidney disease), stage III ([place]) 02/09/2015  . Essential (primary) hypertension 02/09/2015  . Gastro-esophageal reflux disease without esophagitis 02/09/2015  . Anxiety, generalized 02/09/2015  . Cannot sleep 02/09/2015  . Combined fat and carbohydrate induced hyperlipemia 02/09/2015    Past Surgical History:  Procedure Laterality Date  . CATARACT EXTRACTION, BILATERAL    . COLONOSCOPY  May 2011  . CYSTOSCOPY    . TOTAL HIP ARTHROPLASTY  2003   Right   . TOTAL HIP ARTHROPLASTY  April, 2011   Left  . TRIGGER FINGER RELEASE  Nov 2013  . TURBINECTOMY  2006  . UPPER GI ENDOSCOPY  Nov 2015       Family History  Problem Relation  Age of Onset  . Anuerysm Mother 68       Deceased  . Colon cancer Father 39       Deceased  . Colon cancer Brother   . Prostate cancer Brother        Deceased  . Alzheimer's disease Brother   . Heart disease Brother   . Alzheimer's disease Paternal Grandmother   . Early death Maternal Grandmother        Unknown  . Obesity Son   . Obesity Son        Gastric Bypass  . Healthy Son   . Healthy Daughter   . Diabetes Neg Hx     Social History   Tobacco Use  . Smoking status: Former Research scientist (life sciences)  . Smokeless tobacco: Never Used  . Tobacco comment: Quit >50 years ago  Vaping Use  . Vaping Use: Never used  Substance Use Topics  . Alcohol use: Yes    Alcohol/week: 1.0 - 4.0 standard drink    Types: 1 - 4 Shots of liquor per week    Comment: 4 glasses per week  . Drug use: No    Home Medications Prior to Admission medications   Medication Sig Start Date End Date Taking? Authorizing Provider  allopurinol (ZYLOPRIM) 100 MG tablet TAKE 2 TABLETS(200 MG) BY MOUTH DAILY Patient taking differently: Take 200 mg by mouth daily with breakfast.  05/19/19   Copland, Gay Filler, MD  ALPRAZolam (XANAX) 0.5 MG tablet TAKE 1 TABLET(0.5 MG) BY MOUTH THREE TIMES DAILY AS NEEDED FOR ANXIETY 03/02/20   Copland, Gay Filler, MD  amoxicillin (AMOXIL) 500 MG capsule Take 4 caps (2gm) by mouth once prior to dental procedure 08/06/19   Copland, Gay Filler, MD  azelastine (ASTELIN) 0.1 % nasal spray Place 2 sprays into both nostrils 2 (two) times daily as needed (for seasonal allergies). 01/29/20   Copland, Gay Filler, MD  b complex vitamins tablet Take 1 tablet by mouth daily.    [provider]  baclofen (LIORESAL) 10 MG tablet Take 1/2 to 1 by mouth every 8hrs as needed for spasm 10/15/19   Copland, Gay Filler, MD  bethanechol (URECHOLINE) 25 MG tablet TAKE 1 TABLET(25 MG) BY MOUTH THREE TIMES DAILY Patient taking differently: Take 25 mg by mouth 3 (three) times daily.  07/14/19   Copland, Gay Filler, MD    Calcium 500 MG tablet Take 500 mg by mouth daily.     [provider]  Cholecalciferol (VITAMIN D3) 2000 UNITS capsule Take 2,000 Units by mouth daily.    [provider]  clobetasol (TEMOVATE) 0.05 % GEL Apply small amount to tip of tongue twice daily.  Dry tongue first, then apply with qtip 03/07/20   Copland, Gay Filler, MD  Cyanocobalamin (VITAMIN B-12 PO) Take 1 tablet by mouth daily after breakfast.    [provider]  diazepam (VALIUM) 5 MG/ML injection  03/01/20   [provider]  doxazosin (CARDURA) 2 MG tablet Take by mouth. 03/01/20   [provider]  fluorouracil (EFUDEX) 5 %  cream Apply topically. 03/01/20   [provider]  folic acid (FOLVITE) 1 MG tablet TAKE 2 TABLETS(2 MG) BY MOUTH DAILY 03/07/20   [name], MD  furosemide (LASIX) 40 MG tablet Take 1 tablet (40 mg total) by mouth daily as needed for edema. 01/13/20 02/12/20  Donato Heinz, MD  gabapentin (NEURONTIN) 300 MG capsule TAKE 1 CAPSULE BY MOUTH THREE TIMES DAILY PLUS 2 CAPSULES AS NEEDED 03/01/20   Copland, Gay Filler, MD  HYDROcodone-acetaminophen (NORCO) 7.5-325 MG tablet Take 1 tablet by mouth every 6 (six) hours as needed for moderate pain. 03/19/20   Copland, Gay Filler, MD  Magnesium Oxide 200 MG TABS Take 200 mg by mouth daily.  05/02/11   [provider]  MAXITROL 3.5-10000-0.1 OINT Apply 1 Small Amount Both Eyes every evening 03/01/20   [provider]  Melatonin 10 MG TABS Take 20 mg by mouth at bedtime.     [provider]  Multiple Vitamin (MULTIVITAMIN) tablet Take 1 tablet by mouth daily.     [provider]  Omega-3 Fatty Acids (OMEGA-3 FISH OIL PO) Take 1 capsule by mouth daily. EPA/DHA 520/350     [provider]  Polyethyl Glyc-Propyl Glyc PF (SYSTANE ULTRA PF) 0.4-0.3 % SOLN Place 1-2 drops into both eyes 3 (three) times daily as needed (for dryness).     [provider]  polyethylene glycol  (MIRALAX / GLYCOLAX) packet Take 17 g by mouth daily as needed (into 6-8 ounces of water and drink (in conjunction with Metamucil) once a day only when taking Norco).     [provider]  Potassium Chloride ER 20 MEQ TBCR Take 20 mEq by mouth daily. 12/08/19   Donato Heinz, MD  prednisoLONE acetate (PRED FORTE) 1 % ophthalmic suspension  03/01/20   [provider]  predniSONE (DELTASONE) 20 MG tablet May take 1 tablet daily for up to 3 days as needed for gout attack or muscle spasm 01/30/19   Copland, Gay Filler, MD  Psyllium (METAMUCIL PO) Take by mouth See admin instructions. Mix 1 teaspoonful into 6-8 ounces of water and drink (in conjunction with Miralax) once a day only when taking Norco    [provider]  Pyridoxine HCl (VITAMIN B-6 PO) Take 1 tablet by mouth daily after breakfast.    [provider]  vitamin C (ASCORBIC ACID) 500 MG tablet Take 1,000 mg by mouth daily.     [provider]  zinc gluconate 50 MG tablet Take 50 mg by mouth daily.    [provider]    Allergies    Nitroglycerin, Nsaids, Statins, Tolmetin, Ciprofloxacin, Ropinirole, and Requip [ropinirole hcl]  Review of Systems   Review of Systems  Constitutional: Negative for fever.  HENT: Negative for sore throat.   Eyes: Negative for visual disturbance.  Respiratory: Positive for shortness of breath. Negative for cough.   Cardiovascular: Positive for leg swelling. Negative for chest pain and syncope.  Gastrointestinal: Negative for abdominal pain, diarrhea, nausea and vomiting.  Genitourinary: Negative for frequency.  Musculoskeletal: Positive for falls. Negative for neck pain.  Skin: Positive for wound (healing right elbow).  Neurological: Positive for dizziness, tremors and weakness. Negative for loss of consciousness and headaches.    Physical Exam Updated Vital Signs BP 114/74 (BP Location: Left Arm)   Pulse 95   Temp 97.8 F (36.6 C) (Oral)    Resp 16   SpO2 100%   Physical Exam Vitals and nursing note reviewed.  Constitutional:      Appearance: Normal appearance. He is well-developed.  HENT:     Head: Normocephalic and atraumatic.  Eyes:     Conjunctiva/sclera: Conjunctivae normal.  Cardiovascular:     Rate and Rhythm: Normal rate and regular rhythm.     Heart sounds: No murmur heard.   Pulmonary:     Effort: Pulmonary effort is normal. No respiratory distress.     Breath sounds: Normal breath sounds.  Abdominal:     Palpations: Abdomen is soft.     Tenderness: There is no abdominal tenderness. There is no guarding or rebound.  Musculoskeletal:        General: Normal range of motion.     Cervical back: Neck supple.     Right lower leg: Edema present.     Left lower leg: Edema present.  Skin:    General: Skin is warm and dry.     Capillary Refill: Capillary refill takes less than 2 seconds.     Findings: Bruising present.  Neurological:     General: No focal deficit present.     Mental Status: He is alert.     Sensory: Sensory deficit (distal neuropathy) present.     ED Results / Procedures / Treatments   Labs (all labs ordered are listed, but only abnormal results are displayed) Labs Reviewed  COMPREHENSIVE METABOLIC PANEL - Abnormal; Notable for the following components:      Result Value   Glucose, Bld 140 (*)    BUN 53 (*)    Creatinine, Ser 2.57 (*)    Total Protein 6.4 (*)    Albumin 3.3 (*)    Total Bilirubin 2.3 (*)    GFR, Estimated 21 (*)    All other components within normal limits  LACTIC ACID, PLASMA - Abnormal; Notable for the following components:   Lactic Acid, Venous 2.1 (*)    All other components within normal limits  LACTIC ACID, PLASMA - Abnormal; Notable for the following components:   Lactic Acid, Venous 2.1 (*)    All other components within normal limits  CBC WITH DIFFERENTIAL/PLATELET - Abnormal; Notable for the following components:   RBC 2.01 (*)    Hemoglobin 7.8 (*)      HCT 24.6 (*)    MCV 122.4 (*)    MCH 38.8 (*)    RDW 27.9 (*)    Platelets 24 (*)    nRBC 8.6 (*)    Monocytes Absolute 2.1 (*)    Abs Immature Granulocytes 0.87 (*)    All other components within normal limits  BRAIN NATRIURETIC PEPTIDE - Abnormal; Notable for the following components:   B Natriuretic Peptide 1,658.0 (*)    All other components within normal limits  URINALYSIS, ROUTINE W REFLEX MICROSCOPIC - Abnormal; Notable for the following components:   Hgb urine dipstick SMALL (*)    Protein, ur 30 (*)    Leukocytes,Ua TRACE (*)    Bacteria, UA RARE (*)    All other components within normal limits  RETICULOCYTES - Abnormal; Notable for the following components:   Retic Ct Pct 5.1 (*)    RBC. 1.94 (*)    Immature Retic Fract 36.0 (*)    All other components within normal limits  CBC - Abnormal; Notable for the following components:   RBC 2.43 (*)    Hemoglobin 8.8 (*)    HCT 27.2 (*)    MCV 111.9 (*)    MCH 36.2 (*)    RDW 27.6 (*)  Platelets 27 (*)    nRBC 6.9 (*)    All other components within normal limits  BASIC METABOLIC PANEL - Abnormal; Notable for the following components:   Glucose, Bld 109 (*)    BUN 47 (*)    Creatinine, Ser 2.11 (*)    GFR, Estimated 27 (*)    All other components within normal limits  TROPONIN I (HIGH SENSITIVITY) - Abnormal; Notable for the following components:   Troponin I (High Sensitivity) 302 (*)    All other components within normal limits  TROPONIN I (HIGH SENSITIVITY) - Abnormal; Notable for the following components:   Troponin I (High Sensitivity) 301 (*)    All other components within normal limits  RESPIRATORY PANEL BY RT PCR (FLU A&B, COVID)  URINE CULTURE  MAGNESIUM  SAVE SMEAR (SSMR)  PATHOLOGIST SMEAR REVIEW  TYPE AND SCREEN  PREPARE RBC (CROSSMATCH)    EKG EKG Interpretation  Date/Time:  Monday April 04 2020 18:02:36 EDT Ventricular Rate:  92 PR Interval:    QRS Duration: 125 QT Interval:  365 QTC  Calculation: 452 R Axis:   -15 Text Interpretation: Sinus rhythm Nonspecific intraventricular conduction delay Nonspecific T abnormalities, lateral leads When compared with ECG of 09/25/2019, No significant change was found Confirmed by Delora Fuel ([id number]) on 04/05/2020 12:52:15 AM Also confirmed by Delora Fuel ([id number]), editor Hattie Perch [phone])  on 04/05/2020 9:15:13 AM   Radiology DG Chest Port 1 View  Result Date: 04/04/2020 CLINICAL DATA:  Shortness of breath EXAM: PORTABLE CHEST 1 VIEW COMPARISON:  12/04/2019 FINDINGS: Cardiac shadow is enlarged but stable. Aortic calcifications are again seen. Mild vascular congestion and interstitial edema is seen particularly in the right lung. No sizable effusion is noted. No bony abnormality is seen. Old left clavicular fracture with healing is noted. IMPRESSION: Increased vascular congestion with evidence of mild interstitial edema. Electronically Signed   By: Inez Catalina M.D.   On: 04/04/2020 18:45   ECHOCARDIOGRAM COMPLETE  Result Date: 04/05/2020    ECHOCARDIOGRAM REPORT   Patient Name:   GAYLON BENTZ Date of Exam: 04/05/2020 Medical Rec #:  [id number]        Height:       70.0 in Accession #:    [id number]       Weight:       190.0 lb Date of Birth:  1928-06-08        BSA:          2.042 m Patient Age:    89 years         BP:           110/71 mmHg Patient Gender: M                HR:           88 bpm. Exam Location:  Inpatient Procedure: 2D Echo, Cardiac Doppler, Color Doppler and Intracardiac            Opacification Agent Indications:    CHF  History:        Patient has prior history of Echocardiogram examinations, most                 recent 09/26/2019. CHF, Signs/Symptoms:Shortness of Breath; Risk                 Factors:Hypertension. Lymphoma, CKD.  Sonographer:    Dustin Flock Referring Phys: [id number] JARED M GARDNER  Sonographer Comments: Image acquisition challenging due to uncooperative patient. IMPRESSIONS  1.  Left ventricular  ejection fraction, by estimation, is 30 to 35%. The left ventricle has moderately decreased function. The left ventricle demonstrates global hypokinesis. There is mild concentric left ventricular hypertrophy. Left ventricular diastolic parameters are consistent with Grade III diastolic dysfunction (restrictive). Elevated left atrial pressure. There is severe hypokinesis of the left ventricular, entire inferior wall and inferolateral wall.  2. Right ventricular systolic function is normal. The right ventricular size is normal. There is normal pulmonary artery systolic pressure. The estimated right ventricular systolic pressure is 65.9 mmHg.  3. Left atrial size was severely dilated.  4. The mitral valve is normal in structure. Mild mitral valve regurgitation. Moderate mitral annular calcification.  5. The aortic valve is tricuspid. There is moderate calcification of the aortic valve. There is moderate thickening of the aortic valve. Aortic valve regurgitation is not visualized. Moderate aortic valve stenosis. Comparison(s): The left ventricular function is significantly worse. The left ventricular diastolic function is significantly worse. The left ventricular wall motion is new. Aortic valve gradients are underestimated due topoor left ventricular function, but aortic stenosis is probably moderate. Aortic cusp mobility is only moderately restricted. FINDINGS  Left Ventricle: Left ventricular ejection fraction, by estimation, is 30 to 35%. The left ventricle has moderately decreased function. The left ventricle demonstrates global hypokinesis. Severe hypokinesis of the left ventricular, entire inferior wall and inferolateral wall. Definity contrast agent was given IV to delineate the left ventricular endocardial borders. The left ventricular internal cavity size was normal in size. There is mild concentric left ventricular hypertrophy. Left ventricular diastolic parameters are consistent with Grade III diastolic  dysfunction (restrictive). Elevated left atrial pressure. Right Ventricle: The right ventricular size is normal. No increase in right ventricular wall thickness. Right ventricular systolic function is normal. There is normal pulmonary artery systolic pressure. The tricuspid regurgitant velocity is 2.35 m/s, and  with an assumed right atrial pressure of 3 mmHg, the estimated right ventricular systolic pressure is 93.5 mmHg. Left Atrium: Left atrial size was severely dilated. Right Atrium: Right atrial size was normal in size. Pericardium: There is no evidence of pericardial effusion. Mitral Valve: The mitral valve is normal in structure. There is moderate thickening of the mitral valve leaflet(s). There is mild calcification of the mitral valve leaflet(s). Moderate mitral annular calcification. Mild mitral valve regurgitation, with centrally-directed jet. Tricuspid Valve: The tricuspid valve is normal in structure. Tricuspid valve regurgitation is trivial. Aortic Valve: The aortic valve is tricuspid. There is moderate calcification of the aortic valve. There is moderate thickening of the aortic valve. Aortic valve regurgitation is not visualized. Moderate aortic stenosis is present. Aortic valve mean gradient measures 18.0 mmHg. Aortic valve peak gradient measures 32.5 mmHg. Aortic valve area, by VTI measures 0.96 cm. Pulmonic Valve: The pulmonic valve was not well visualized. Pulmonic valve regurgitation is not visualized. Aorta: The aortic root is normal in size and structure. IAS/Shunts: No atrial level shunt detected by color flow Doppler.  LEFT VENTRICLE PLAX 2D LVIDd:         4.00 cm  Diastology LVIDs:         3.30 cm  LV e' medial:    3.59 cm/s LV PW:         1.30 cm  LV E/e' medial:  32.9 LV IVS:        1.35 cm  LV e' lateral:   6.96 cm/s LVOT diam:     2.30 cm  LV E/e' lateral: 17.0 LV SV:  60 LV SV Index:   29 LVOT Area:     4.15 cm  RIGHT VENTRICLE RV Basal diam:  3.30 cm RV S prime:     10.80  cm/s TAPSE (M-mode): 2.2 cm LEFT ATRIUM             Index       RIGHT ATRIUM           Index LA diam:        4.70 cm 2.30 cm/m  RA Area:     13.70 cm LA Vol (A2C):   35.8 ml 17.53 ml/m RA Volume:   28.90 ml  14.15 ml/m LA Vol (A4C):   89.9 ml 44.02 ml/m LA Biplane Vol: 61.6 ml 30.16 ml/m  AORTIC VALVE AV Area (Vmax):    1.03 cm AV Area (Vmean):   0.92 cm AV Area (VTI):     0.96 cm AV Vmax:           285.00 cm/s AV Vmean:          190.000 cm/s AV VTI:            0.626 m AV Peak Grad:      32.5 mmHg AV Mean Grad:      18.0 mmHg LVOT Vmax:         70.80 cm/s LVOT Vmean:        42.300 cm/s LVOT VTI:          0.144 m LVOT/AV VTI ratio: 0.23  AORTA Ao Root diam: 2.90 cm MITRAL VALVE                TRICUSPID VALVE MV Area (PHT): 6.32 cm     TR Peak grad:   22.1 mmHg MV Decel Time: 120 msec     TR Vmax:        235.00 cm/s MV E velocity: 118.00 cm/s MV A velocity: 35.00 cm/s   SHUNTS MV E/A ratio:  3.37         Systemic VTI:  0.14 m                             Systemic Diam: 2.30 cm Dani Gobble Croitoru MD Electronically signed by Sanda Klein MD Signature Date/Time: 04/05/2020/10:57:14 AM    Final     Procedures .Critical Care Performed by: Hayden Rasmussen, MD Authorized by: Hayden Rasmussen, MD   Critical care provider statement:    Critical care time (minutes):  45   Critical care time was exclusive of:  Separately billable procedures and treating other patients   Critical care was necessary to treat or prevent imminent or life-threatening deterioration of the following conditions:  Circulatory failure, cardiac failure, respiratory failure and metabolic crisis   Critical care was time spent personally by me on the following activities:  Discussions with consultants, evaluation of patient's response to treatment, examination of patient, ordering and performing treatments and interventions, ordering and review of laboratory studies, ordering and review of radiographic studies, pulse oximetry,  re-evaluation of patient's condition, obtaining history from patient or surrogate, review of old charts and development of treatment plan with patient or surrogate   I assumed direction of critical care for this patient from another provider in my specialty: no     (including critical care time)  Medications Ordered in ED Medications  ALPRAZolam (XANAX) tablet 0.5 mg (0.5 mg Oral Given 04/05/20 0143)  zinc sulfate capsule 220 mg (220 mg  Oral Given 04/04/20 2355)  ascorbic acid (VITAMIN C) tablet 1,000 mg (1,000 mg Oral Given 04/04/20 2355)  polyethylene glycol (MIRALAX / GLYCOLAX) packet 17 g (has no administration in time range)  HYDROcodone-acetaminophen (NORCO) 7.5-325 MG per tablet 1 tablet (has no administration in time range)  gabapentin (NEURONTIN) capsule 300 mg (300 mg Oral Given 27/25/36 6440)  folic acid (FOLVITE) tablet 1 mg (1 mg Oral Given 04/04/20 2356)  cholecalciferol (VITAMIN D3) tablet 2,000 Units (2,000 Units Oral Given 04/04/20 2356)  calcium carbonate (OS-CAL - dosed in mg of elemental calcium) tablet 1,250 mg (1,250 mg Oral Given 04/05/20 0814)  melatonin tablet 10 mg (10 mg Oral Given 04/04/20 2356)  polyvinyl alcohol (LIQUIFILM TEARS) 1.4 % ophthalmic solution 1-2 drop (has no administration in time range)  acetaminophen (TYLENOL) tablet 650 mg (has no administration in time range)    Or  acetaminophen (TYLENOL) suppository 650 mg (has no administration in time range)  ondansetron (ZOFRAN) tablet 4 mg (has no administration in time range)    Or  ondansetron (ZOFRAN) injection 4 mg (has no administration in time range)  furosemide (LASIX) injection 60 mg (has no administration in time range)  perflutren lipid microspheres (DEFINITY) IV suspension (2 mLs Intravenous Given 04/05/20 1012)  cefTRIAXone (ROCEPHIN) 1 g in sodium chloride 0.9 % 100 mL IVPB (0 g Intravenous Stopped 04/04/20 2035)  furosemide (LASIX) injection 40 mg (40 mg Intravenous Given 04/04/20 2035)    0.9 %  sodium chloride infusion (Manually program via Guardrails IV Fluids) ( Intravenous Stopped 04/05/20 0422)  furosemide (LASIX) injection 20 mg (20 mg Intravenous Given 04/04/20 2356)    ED Course  I have reviewed the triage vital signs and the nursing notes.  Pertinent labs & imaging results that were available during my care of the patient were reviewed by me and considered in my medical decision making (see chart for details).  Clinical Course as of Apr 05 1102  Mon Apr 04, 2020  3474 Cardiac echo - 4/21 - 1. Left ventricular ejection fraction, by estimation, is 55 to 60%. The  left ventricle has normal function. The left ventricle has no regional  wall motion abnormalities. There is mild left ventricular hypertrophy.  Left ventricular diastolic parameters  are consistent with Grade I diastolic dysfunction (impaired relaxation).  Elevated left ventricular end-diastolic pressure.  2. Right ventricular systolic function is normal. The right ventricular  size is normal.  3. Left atrial size was severely dilated.  4. The mitral valve is abnormal. Trivial mitral valve regurgitation. The  mean mitral valve gradient is 3.0 mmHg.  5. The aortic valve is tricuspid. Aortic valve regurgitation is trivial.  Moderate aortic valve stenosis. Aortic valve area, by VTI measures 1.03  cm. Aortic valve mean gradient measures 17.6 mmHg. Aortic valve Vmax  measures 2.83 m/s.  6. The inferior vena cava is normal in size with greater than 50%  respiratory variability, suggesting right atrial pressure of 3 mmHg.    [MB]  1829 EKG shows sinus rhythm, nonspecific idioventricular conduction delay.  T wave inversions laterally.  No acute ST-T changes.   [MB]  2595 Chest x-ray showing some possible vascular congestion cannot exclude left effusion.  Awaiting radiology reading.   [MB]  2021 Discussed with [facility] MG on-call Dr. Clayton Bibles.  He said he would put a consult on the patient but did not feel  this represented ACS.    [MB]  2056 Discussed with Dr. Alcario Drought [facility] hospitalist to evaluate the patient for admission.   [  MB]  2100 I called the patient's son and updated him on current status and plan for admission.   [MB]    Clinical Course User Index [MB] Hayden Rasmussen, MD   MDM Rules/Calculators/A&P                         This patient complains of tremors, weakness, shortness of breath, confusion; this involves an extensive number of treatment Options and is a complaint that carries with it a high risk of complications and Morbidity. The differential includes anemia, CHF, ACS, renal failure, metabolic derangement, sepsis, Sirs  I ordered, reviewed and interpreted labs, which included CBC with hemoglobin lower than baseline, platelets lower than baseline, chemistries with elevated BUN and creatinine, urinalysis with possible signs of infection 11-20 whites, BNP elevated, Covid testing negative I ordered medication IV Lasix and IV antibiotics I ordered imaging studies which included chest x-ray and I independently    visualized and interpreted imaging which showed vascular congestion Additional history obtained from patient's son Previous records obtained and reviewed in epic, recent visits to Dr. Marin Olp oncology and gets intermittent transfusions I consulted Dr. Alcario Drought [facility] hospitalist and Dr. Leafy Kindle cardiology and discussed lab and imaging findings  Critical Interventions: Diuresis of CHF and treatment of possible early sepsis with antibiotics.  Patient will need to be admitted to the hospital for transfusion and further work-up.  After the interventions stated above, I reevaluated the patient and found patient still to be fatigued although hemodynamically stable.  He is in agreement for admission.   Final Clinical Impression(s) / ED Diagnoses Final diagnoses:  Symptomatic anemia  Acute on chronic diastolic (congestive) heart failure (HCC)  AKI (acute kidney injury)  ([place])  Urinary tract infection without hematuria, site unspecified    Rx / DC Orders ED Discharge Orders    None       Hayden Rasmussen, MD 04/05/20 1110

## 2020-04-04 NOTE — H&P (Addendum)
History and Physical    MIZAEL SAGAR OQH:[id number] DOB: [birth year] DOA: 04/04/2020  PCP: [name], MD  Patient coming from: Home  I have personally briefly reviewed patient's old medical records in [place]  Chief Complaint: Tremors  HPI: GRAIDEN HENES is a 86 y.o. male with medical history significant of DLBCL in remission, CMML / MDS, transfusion dependent following with Dr. Marin Olp.  Looks like this has been progressing with worsening thrombocytopenia over the past month or two.  Dr. Marin Olp recently switched him from Aranesp to [place] in an attempt to treat.  Last PRBC transfusion was 3 weeks ago which brought HGB from 8.1 to 8.9.  Pt having tremors of arms and legs onset around 4am this morning.  Son thought electrolytes might be off since he had an episode about a month ago that they thought was due to mild hypokalemia.  Son also notes patient had fall a few weeks ago, is progressively weaker when ambulating.  Feels dizzy at times.  Symptoms are constant, progressively worsening, severe, worse with ambulation.  Pt denies headache, CP, abd pain, N/V/D, dysuria, melena, hematochezia.  Pt gets DOE.  Takes lasix sometimes for leg swelling.  Has had COVID vaccine.   ED Course: HGB 7.8 down from 8.9 x3 weeks ago. Platelets 24 down from 41 x3 weeks ago, and 65 x1 month ago.  Trop 302 and 301.  BNP 1658.  Creat 2.57 up from 1.9 baseline.  CXR shows CHF findings.  WBC 7.  UA with trace LE, 11-20 WBC.  Given Rocephin x1 in ED, UCx pending.   Review of Systems: As per HPI, otherwise all review of systems negative.  Past Medical History:  Diagnosis Date  . Acute exacerbation of CHF (congestive heart failure) ([place]) 09/25/2019  . Anemia   . Anemia due to antineoplastic chemotherapy 08/15/2015  . Anxiety   . Arthritis   . Cataract   . Chronic kidney disease (CKD), stage III (moderate) (HCC) 02/09/2015   Controlled  . CMML (chronic  myelomonocytic leukemia) ([place])    gets Aranesp about monthly, otherwise surveillance with Dr. Marin Olp  . Combined fat and carbohydrate induced hyperlipemia 02/09/2015   Controlled  . Depression   . DLBCL (diffuse large B cell lymphoma) ([place]) 08/15/2015  . Essential (primary) hypertension 02/09/2015  . Gastro-esophageal reflux disease without esophagitis 02/09/2015   Controlled  . Renal insufficiency   . Tinnitus     Past Surgical History:  Procedure Laterality Date  . CATARACT EXTRACTION, BILATERAL    . COLONOSCOPY  May 2011  . CYSTOSCOPY    . TOTAL HIP ARTHROPLASTY  2003   Right   . TOTAL HIP ARTHROPLASTY  April, 2011   Left  . TRIGGER FINGER RELEASE  Nov 2013  . TURBINECTOMY  2006  . UPPER GI ENDOSCOPY  Nov 2015     reports that he has quit smoking. He has never used smokeless tobacco. He reports current alcohol use of about 1.0 - 4.0 standard drink of alcohol per week. He reports that he does not use drugs.  Allergies  Allergen Reactions  . Nitroglycerin Other (See Comments)    "I ended up in a fetal position from the pain"  . Nsaids Other (See Comments) and Tinitus    Bruising   . Statins Nausea And Vomiting and Other (See Comments)    Kidney issues and muscle pain   . Tolmetin Other (See Comments)    Bruising  . Ciprofloxacin Other (See Comments)  Achilles tendon issues  . Ropinirole Anxiety and Other (See Comments)    Mood swings, also  . Requip [Ropinirole Hcl] Anxiety    Family History  Problem Relation Age of Onset  . Anuerysm Mother 61       Deceased  . Colon cancer Father 12       Deceased  . Colon cancer Brother   . Prostate cancer Brother        Deceased  . Alzheimer's disease Brother   . Heart disease Brother   . Alzheimer's disease Paternal Grandmother   . Early death Maternal Grandmother        Unknown  . Obesity Son   . Obesity Son        Gastric Bypass  . Healthy Son   . Healthy Daughter   . Diabetes Neg Hx      Prior to Admission  medications   Medication Sig Start Date End Date Taking? Authorizing Provider  allopurinol (ZYLOPRIM) 100 MG tablet TAKE 2 TABLETS(200 MG) BY MOUTH DAILY Patient taking differently: Take 200 mg by mouth daily with breakfast.  05/19/19  Yes Copland, Gay Filler, MD  ALPRAZolam (XANAX) 0.5 MG tablet TAKE 1 TABLET(0.5 MG) BY MOUTH THREE TIMES DAILY AS NEEDED FOR ANXIETY Patient taking differently: Take 0.5 mg by mouth 3 (three) times daily as needed for anxiety.  03/02/20  Yes Copland, Gay Filler, MD  Artificial Tear Ointment (DRY EYES OP) Apply 1 drop to eye daily as needed (for dry eyes).   Yes [provider]  b complex vitamins tablet Take 1 tablet by mouth daily.   Yes [provider]  baclofen (LIORESAL) 10 MG tablet Take 1/2 to 1 by mouth every 8hrs as needed for spasm 10/15/19  Yes Copland, Gay Filler, MD  bethanechol (URECHOLINE) 25 MG tablet TAKE 1 TABLET(25 MG) BY MOUTH THREE TIMES DAILY Patient taking differently: Take 25 mg by mouth 3 (three) times daily.  07/14/19  Yes Copland, Gay Filler, MD  Calcium 500 MG tablet Take 500 mg by mouth daily.    Yes [provider]  Cholecalciferol (VITAMIN D3) 2000 UNITS capsule Take 2,000 Units by mouth daily.   Yes [provider]  Cyanocobalamin (VITAMIN B-12 PO) Take 1 tablet by mouth daily after breakfast.   Yes [provider]  fluorouracil (EFUDEX) 5 % cream Apply 1 application topically.  03/01/20  Yes [provider]  folic acid (FOLVITE) 1 MG tablet TAKE 2 TABLETS(2 MG) BY MOUTH DAILY Patient taking differently: Take 1 mg by mouth daily.  03/07/20  Yes Ennever, Rudell Cobb, MD  furosemide (LASIX) 40 MG tablet Take 1 tablet (40 mg total) by mouth daily as needed for edema. 01/13/20 04/04/20 Yes Donato Heinz, MD  gabapentin (NEURONTIN) 300 MG capsule TAKE 1 CAPSULE BY MOUTH THREE TIMES DAILY PLUS 2 CAPSULES AS NEEDED Patient taking differently: Take 300 mg by mouth 3 (three) times daily.  03/01/20   Yes Copland, Gay Filler, MD  HYDROcodone-acetaminophen (NORCO) 7.5-325 MG tablet Take 1 tablet by mouth every 6 (six) hours as needed for moderate pain. 03/19/20  Yes Copland, Gay Filler, MD  Melatonin 10 MG TABS Take 20 mg by mouth at bedtime.    Yes [provider]  oxyCODONE-acetaminophen (PERCOCET/ROXICET) 5-325 MG tablet Take 1 tablet by mouth every 6 (six) hours as needed for severe pain.   Yes [provider]  Polyethyl Glyc-Propyl Glyc PF (SYSTANE ULTRA PF) 0.4-0.3 % SOLN Place 1-2 drops into both eyes  3 (three) times daily as needed (for dryness).    Yes [provider]  polyethylene glycol (MIRALAX / GLYCOLAX) packet Take 17 g by mouth daily as needed for mild constipation.    Yes [provider]  Potassium Chloride ER 20 MEQ TBCR Take 20 mEq by mouth daily. 12/08/19  Yes Donato Heinz, MD  Pyridoxine HCl (VITAMIN B-6 PO) Take 1 tablet by mouth daily after breakfast.   Yes [provider]  vitamin C (ASCORBIC ACID) 500 MG tablet Take 1,000 mg by mouth daily.    Yes [provider]  zinc gluconate 50 MG tablet Take 50 mg by mouth daily.   Yes [provider]    Physical Exam: Vitals:   04/04/20 1804 04/04/20 1845 04/04/20 2000 04/04/20 2030  BP: 114/74   117/72  Pulse: 95 96 89 89  Resp: 16 20 18 19   Temp: 97.8 F (36.6 C)     TempSrc: Oral     SpO2: 100% 98% 100% 100%    Constitutional: NAD, calm, comfortable Eyes: PERRL, lids and conjunctivae normal ENMT: Mucous membranes are moist. Posterior pharynx clear of any exudate or lesions.Normal dentition.  Neck: normal, supple, no masses, no thyromegaly Respiratory: clear to auscultation bilaterally, no wheezing, no crackles. Normal respiratory effort. No accessory muscle use.  Cardiovascular: Regular rate and rhythm, no murmurs / rubs / gallops. No extremity edema. 2+ pedal pulses. No carotid bruits.  Abdomen: no tenderness, no masses palpated. No  hepatosplenomegaly. Bowel sounds positive.  Musculoskeletal: no clubbing / cyanosis. No joint deformity upper and lower extremities. Good ROM, no contractures. Normal muscle tone.  Skin: no rashes, lesions, ulcers. No induration Neurologic: CN 2-12 grossly intact. Sensation intact, DTR normal. Strength 5/5 in all 4.  Psychiatric: Normal judgment and insight. Alert and oriented x 3. Normal mood.    Labs on Admission: I have personally reviewed following labs and imaging studies  CBC: Recent Labs  Lab 04/04/20 1805  WBC 7.3  NEUTROABS 3.5  HGB 7.8*  HCT 24.6*  MCV 122.4*  PLT 24*   Basic Metabolic Panel: Recent Labs  Lab 04/04/20 1805  NA 139  K 3.9  CL 103  CO2 22  GLUCOSE 140*  BUN 53*  CREATININE 2.57*  CALCIUM 9.2  MG 2.1   GFR: Estimated Creatinine Clearance: 19.7 mL/min (A) (by C-G formula based on SCr of 2.57 mg/dL (H)). Liver Function Tests: Recent Labs  Lab 04/04/20 1805  AST 25  ALT 22  ALKPHOS 63  BILITOT 2.3*  PROT 6.4*  ALBUMIN 3.3*   No results for input(s): LIPASE, AMYLASE in the last 168 hours. No results for input(s): AMMONIA in the last 168 hours. Coagulation Profile: No results for input(s): INR, PROTIME in the last 168 hours. Cardiac Enzymes: No results for input(s): CKTOTAL, CKMB, CKMBINDEX, TROPONINI in the last 168 hours. BNP (last 3 results) Recent Labs    12/02/19 1139  PROBNP 1,021.0*   HbA1C: No results for input(s): HGBA1C in the last 72 hours. CBG: No results for input(s): GLUCAP in the last 168 hours. Lipid Profile: No results for input(s): CHOL, HDL, LDLCALC, TRIG, CHOLHDL, LDLDIRECT in the last 72 hours. Thyroid Function Tests: No results for input(s): TSH, T4TOTAL, FREET4, T3FREE, THYROIDAB in the last 72 hours. Anemia Panel: No results for input(s): VITAMINB12, FOLATE, FERRITIN, TIBC, IRON, RETICCTPCT in the last 72 hours. Urine analysis:    Component Value Date/Time   COLORURINE YELLOW 04/04/2020 [place] 04/04/2020 1823  LABSPEC 1.017 04/04/2020 1823   PHURINE 5.0 04/04/2020 1823   GLUCOSEU NEGATIVE 04/04/2020 1823   HGBUR SMALL (A) 04/04/2020 1823   BILIRUBINUR NEGATIVE 04/04/2020 1823   BILIRUBINUR negative 12/11/2017 [place] 04/04/2020 1823   PROTEINUR 30 (A) 04/04/2020 1823   UROBILINOGEN 1.0 12/11/2017 1507   NITRITE NEGATIVE 04/04/2020 1823   LEUKOCYTESUR TRACE (A) 04/04/2020 1823    Radiological Exams on Admission: DG Chest Port 1 View  Result Date: 04/04/2020 CLINICAL DATA:  Shortness of breath EXAM: PORTABLE CHEST 1 VIEW COMPARISON:  12/04/2019 FINDINGS: Cardiac shadow is enlarged but stable. Aortic calcifications are again seen. Mild vascular congestion and interstitial edema is seen particularly in the right lung. No sizable effusion is noted. No bony abnormality is seen. Old left clavicular fracture with healing is noted. IMPRESSION: Increased vascular congestion with evidence of mild interstitial edema. Electronically Signed   By: Inez Catalina M.D.   On: 04/04/2020 18:45    EKG: Independently reviewed.  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 Symptomatic anemia - 1. Pt with acute CHF and AKI that I think are being driven by his worsening symptomatic anemia. 2. Suspect bone marrow failure (MDS vs CMML) to blame for worsening symptomatic anemia with worsening thrombocytopenia too.  Despite attempt to treat with Luspatercept. 1. DDx includes occult GIB, though no report of GIB symptoms on HPI, this wouldn't well explain the thrombocytopenia progression, and PMH more consistent with  bone marrow failure (See Dr. Marin Olp office notes). 3. Getting lasix x1 now 4. Then transfuse 1u PRBC 5. Then Lasix 60mg  IV in AM 6. Check hemoccult with next BM 7. Repeat CBC in AM 8. Call Dr. Marin Olp for consult in AM 2. Anemia due to previous antineoplastic chemotherapy (R-CHOP for DLBCL back in 2014) / CMML - 1. Call Dr. Marin Olp in AM 2. Think this is worsening (see discussion above) 3. Acute on chronic diastolic CHF - 1. Suspect anemia driving acute component 2. Looks like Cards agrees - see their consult note 3. 60mg  IV lasix now 4. Then 60mg  IV lasix in AM (post transfusion) 5. Strict intake and output 6. 2d echo ordered 4. AKI on CKD 3 - 1. Suspect CHF / anemia to blame 2. IV lasix as above, PRBC transfusion as above 3. Holding allopurinol 4. Strict intake and output 5. Repeat BMP in AM 6. If renal function worsens may need to involve nephrology 5. Debility, need for higher LOC - 1. PT/OT 2. SW consult 6. UA - 1. Not really impressive urinalysis 2. Got 1 dose of rocephin in ED 3. Will hold off on further ABx and see what UCx shows. 7. Thrombocytopenia - 1. Worsening chronic thrombocytopenia, suspect bone marrow failure to blame as discussed above. 2. No blood thinners 3. No known bleeding at this point so no indication for platelet transfusion right now. 4. Heme/onc consult in AM as discussed above.  DVT prophylaxis: SCDs - thrombocytopenia Code Status: DNR - confirmed with patient Family Communication: Spoke with Pt's grandson on phone, Yolanda Bonine is Dr. Lalla Brothers with IM here at [facility] Disposition Plan: ALF? Consults called: Cards consulted by EDP, see consult note,  Call Dr. Marin Olp (heme/onc) in AM Admission status: Admit to inpatient  Severity of Illness: The appropriate patient status for this patient is INPATIENT. Inpatient status is judged to be reasonable and necessary in order to provide the required intensity of service to ensure the patient's safety.  The patient's presenting symptoms, physical exam findings, and initial radiographic and laboratory data in the context of their chronic comorbidities is felt to place them at high risk for further clinical deterioration. Furthermore, it is not anticipated that the patient will be medically stable for discharge from the hospital within 2 midnights of admission. The following factors support the patient status of inpatient.   IP status due to symptomatic anemia causing secondary CHF and AKI (bone marrow failure with secondary heart and kidney failure).   * I certify that at the point of admission it is my clinical judgment that the patient will require inpatient hospital care spanning beyond 2 midnights from the point of admission due to high intensity of service, high risk for further deterioration and high frequency of surveillance required.*    Kinze Labo M. DO [facility]  How to contact the [facility] Attending or Consulting provider [place] or covering provider during after hours [place], for this patient?  1. Check the care team in [facility] and look for a) attending/consulting [facility] provider listed and b) the [facility] team listed 2. Log into [email or web address]  Amion Physician Scheduling and messaging for groups and whole hospitals  On call and physician scheduling software for group practices, residents, hospitalists and other medical providers for call, clinic, rotation and shift schedules. On[employer] is a hospital-wide system for scheduling doctors and paging doctors on call. EasyPlot is for scientific plotting and data analysis.  [email or web address]  and use [place]'s universal password to access. If you do not have the password, please contact the hospital operator.  3. Locate the [facility] provider you are looking for under [facility] and page to a number that you can be directly reached. 4. If you still have difficulty reaching the provider, please page the [facility] (Director on Call) for the  Hospitalists listed on amion for assistance.  04/04/2020, 10:05 PM

## 2020-04-04 NOTE — Consult Note (Signed)
Cardiology Consultation:   Patient ID: Jeremiah Walker MRN: [id number]; DOB: 05-28-1928  Admit date: 04/04/2020 Date of Consult: 04/04/2020  Primary Care Provider: [name], MD [facility] Cardiologist: Donato Heinz, MD  [place] Electrophysiologist:  None    Patient Profile:   Jeremiah Walker is a 86 y.o. male with a hx of DLBCL, CML, CKD III/IV, and HFpEF who is being seen today for the evaluation of worsening SOB at the request of Dr. Melina Copa.  History of Present Illness:   Mr. Cavanah tells me that about 1 week ago he began to feel a little more short of breath while working with physical therapy.  Over this last week he has also noted that his legs have been progressively more swollen.  He has not taken any Lasix because he reports he was previously told this was bad for his kidneys.  His shortness of breath has become significant enough that he now requires supplemental oxygen both when ambulating and at rest.  He feels his shortness of breath now is worse when he lays flat or in the bed.  Over the last day or 2 he has not been able to do much more than get out of bed and go to the restroom without feeling significantly short of breath.  Around 4:00 this morning suddenly felt like his body was shaking.  He denies any fevers but does report that his urine has been darker and he has been peeing more frequently.  His weight is up at least 5 pounds over the last week or 2.  He denies any recent chest pain.  His appetite has been poor but he does not believe this is because of significant nausea or abdominal distention.  He has not seen any blood in his stool.  Past Medical History:  Diagnosis Date  . Acute exacerbation of CHF (congestive heart failure) ([place]) 09/25/2019  . Anemia   . Anemia due to antineoplastic chemotherapy 08/15/2015  . Anxiety   . Arthritis   . Cataract   . Chronic kidney disease (CKD), stage III (moderate) ([facility]) 02/09/2015   Controlled  .  CMML (chronic myelomonocytic leukemia) ([place])    gets Aranesp about monthly, otherwise surveillance with Dr. Marin Olp  . Combined fat and carbohydrate induced hyperlipemia 02/09/2015   Controlled  . Depression   . DLBCL (diffuse large B cell lymphoma) ([place]) 08/15/2015  . Essential (primary) hypertension 02/09/2015  . Gastro-esophageal reflux disease without esophagitis 02/09/2015   Controlled  . Renal insufficiency   . Tinnitus     Past Surgical History:  Procedure Laterality Date  . CATARACT EXTRACTION, BILATERAL    . COLONOSCOPY  May 2011  . CYSTOSCOPY    . TOTAL HIP ARTHROPLASTY  2003   Right   . TOTAL HIP ARTHROPLASTY  April, 2011   Left  . TRIGGER FINGER RELEASE  Nov 2013  . TURBINECTOMY  2006  . UPPER GI ENDOSCOPY  Nov 2015     Home Medications:  Prior to Admission medications   Medication Sig Start Date End Date Taking? Authorizing Provider  allopurinol (ZYLOPRIM) 100 MG tablet TAKE 2 TABLETS(200 MG) BY MOUTH DAILY Patient taking differently: Take 200 mg by mouth daily with breakfast.  05/19/19  Yes Copland, Gay Filler, MD  ALPRAZolam (XANAX) 0.5 MG tablet TAKE 1 TABLET(0.5 MG) BY MOUTH THREE TIMES DAILY AS NEEDED FOR ANXIETY Patient taking differently: Take 0.5 mg by mouth 3 (three) times daily as needed for anxiety.  03/02/20  Yes  Copland, Gay Filler, MD  Artificial Tear Ointment (DRY EYES OP) Apply 1 drop to eye daily as needed (for dry eyes).   Yes [provider]  b complex vitamins tablet Take 1 tablet by mouth daily.   Yes [provider]  baclofen (LIORESAL) 10 MG tablet Take 1/2 to 1 by mouth every 8hrs as needed for spasm 10/15/19  Yes Copland, Gay Filler, MD  bethanechol (URECHOLINE) 25 MG tablet TAKE 1 TABLET(25 MG) BY MOUTH THREE TIMES DAILY Patient taking differently: Take 25 mg by mouth 3 (three) times daily.  07/14/19  Yes Copland, Gay Filler, MD  Calcium 500 MG tablet Take 500 mg by mouth daily.    Yes [provider]  Cholecalciferol  (VITAMIN D3) 2000 UNITS capsule Take 2,000 Units by mouth daily.   Yes [provider]  Cyanocobalamin (VITAMIN B-12 PO) Take 1 tablet by mouth daily after breakfast.   Yes [provider]  fluorouracil (EFUDEX) 5 % cream Apply 1 application topically.  03/01/20  Yes [provider]  folic acid (FOLVITE) 1 MG tablet TAKE 2 TABLETS(2 MG) BY MOUTH DAILY Patient taking differently: Take 1 mg by mouth daily.  03/07/20  Yes Ennever, Rudell Cobb, MD  furosemide (LASIX) 40 MG tablet Take 1 tablet (40 mg total) by mouth daily as needed for edema. 01/13/20 04/04/20 Yes Donato Heinz, MD  gabapentin (NEURONTIN) 300 MG capsule TAKE 1 CAPSULE BY MOUTH THREE TIMES DAILY PLUS 2 CAPSULES AS NEEDED Patient taking differently: Take 300 mg by mouth 3 (three) times daily.  03/01/20  Yes Copland, Gay Filler, MD  HYDROcodone-acetaminophen (NORCO) 7.5-325 MG tablet Take 1 tablet by mouth every 6 (six) hours as needed for moderate pain. 03/19/20  Yes Copland, Gay Filler, MD  Melatonin 10 MG TABS Take 20 mg by mouth at bedtime.    Yes [provider]  oxyCODONE-acetaminophen (PERCOCET/ROXICET) 5-325 MG tablet Take 1 tablet by mouth every 6 (six) hours as needed for severe pain.   Yes [provider]  Polyethyl Glyc-Propyl Glyc PF (SYSTANE ULTRA PF) 0.4-0.3 % SOLN Place 1-2 drops into both eyes 3 (three) times daily as needed (for dryness).    Yes [provider]  polyethylene glycol (MIRALAX / GLYCOLAX) packet Take 17 g by mouth daily as needed for mild constipation.    Yes [provider]  Potassium Chloride ER 20 MEQ TBCR Take 20 mEq by mouth daily. 12/08/19  Yes Donato Heinz, MD  Pyridoxine HCl (VITAMIN B-6 PO) Take 1 tablet by mouth daily after breakfast.   Yes [provider]  vitamin C (ASCORBIC ACID) 500 MG tablet Take 1,000 mg by mouth daily.    Yes [provider]  zinc gluconate 50 MG tablet Take 50 mg by mouth daily.   Yes  [provider]  azelastine (ASTELIN) 0.1 % nasal spray Place 2 sprays into both nostrils 2 (two) times daily as needed (for seasonal allergies). Patient not taking: Reported on 04/04/2020 01/29/20   Copland, Gay Filler, MD  predniSONE (DELTASONE) 20 MG tablet May take 1 tablet daily for up to 3 days as needed for gout attack or muscle spasm Patient not taking: Reported on 04/04/2020 01/30/19   Copland, Gay Filler, MD    Inpatient Medications: Scheduled Meds: . furosemide  40 mg Intravenous Once    Allergies:    Allergies  Allergen Reactions  . Nitroglycerin Other (See Comments)    "I ended up in a fetal position from the pain"  .  Nsaids Other (See Comments) and Tinitus    Bruising   . Statins Nausea And Vomiting and Other (See Comments)    Kidney issues and muscle pain   . Tolmetin Other (See Comments)    Bruising  . Ciprofloxacin Other (See Comments)    Achilles tendon issues  . Ropinirole Anxiety and Other (See Comments)    Mood swings, also  . Requip [Ropinirole Hcl] Anxiety    Social History:   Social History   Socioeconomic History  . Marital status: Widowed    Spouse name: Not on file  . Number of children: Not on file  . Years of education: Not on file  . Highest education level: Not on file  Occupational History  . Not on file  Tobacco Use  . Smoking status: Former Research scientist (life sciences)  . Smokeless tobacco: Never Used  . Tobacco comment: Quit >50 years ago  Vaping Use  . Vaping Use: Never used  Substance and Sexual Activity  . Alcohol use: Yes    Alcohol/week: 1.0 - 4.0 standard drink    Types: 1 - 4 Shots of liquor per week    Comment: 4 glasses per week  . Drug use: No  . Sexual activity: Not on file  Other Topics Concern  . Not on file  Social History Narrative  . Not on file   Social Determinants of Health   Financial Resource Strain: Low Risk   . Difficulty of Paying Living Expenses: Not hard at all  Food Insecurity: No Food Insecurity  . Worried  About Charity fundraiser in the Last Year: Never true  . Ran Out of Food in the Last Year: Never true  Transportation Needs: No Transportation Needs  . Lack of Transportation (Medical): No  . Lack of Transportation (Non-Medical): No  Physical Activity:   . Days of Exercise per Week: Not on file  . Minutes of Exercise per Session: Not on file  Stress:   . Feeling of Stress : Not on file  Social Connections:   . Frequency of Communication with Friends and Family: Not on file  . Frequency of Social Gatherings with Friends and Family: Not on file  . Attends Religious Services: Not on file  . Active Member of Clubs or Organizations: Not on file  . Attends Archivist Meetings: Not on file  . Marital Status: Not on file  Intimate Partner Violence:   . Fear of Current or Ex-Partner: Not on file  . Emotionally Abused: Not on file  . Physically Abused: Not on file  . Sexually Abused: Not on file    Family History:   Family History  Problem Relation Age of Onset  . Anuerysm Mother 73       Deceased  . Colon cancer Father 77       Deceased  . Colon cancer Brother   . Prostate cancer Brother        Deceased  . Alzheimer's disease Brother   . Heart disease Brother   . Alzheimer's disease Paternal Grandmother   . Early death Maternal Grandmother        Unknown  . Obesity Son   . Obesity Son        Gastric Bypass  . Healthy Son   . Healthy Daughter   . Diabetes Neg Hx      ROS:  Please see the history of present illness.   All other ROS reviewed and negative.     Physical Exam/Data:  Vitals:   04/04/20 1804 04/04/20 1845 04/04/20 2000  BP: 114/74    Pulse: 95 96 89  Resp: 16 20 18   Temp: 97.8 F (36.6 C)    TempSrc: Oral    SpO2: 100% 98% 100%   No intake or output data in the 24 hours ending 04/04/20 2020 Last 3 Weights 03/24/2020 03/15/2020 03/07/2020  Weight (lbs) 190 lb 190 lb 12.8 oz 189 lb  Weight (kg) 86.183 kg 86.546 kg 85.73 kg     There is no  height or weight on file to calculate BMI.  General:  Well nourished, well developed, in no acute distress HEENT: normal Lymph: no adenopathy Neck: Neck veins a few cm below the mandible at almost 90 degrees with +HJR Endocrine:  No thryomegaly Vascular: No carotid bruits; FA pulses 2+ bilaterally without bruits  Cardiac:  normal S1, S2; RRR; II/VI high pitched, early peaking systolic murmur best heard at RUSB Lungs:  Bibasilar crackles, no wheezing, rhonchi or rales  Abd: soft, nontender, no hepatomegaly  Ext: 2+, symmetric LE edema to mid shin  Musculoskeletal:  No deformities, BUE and BLE strength normal and equal Skin: warm and dry  Neuro:  CNs 2-12 intact, no focal abnormalities noted Psych:  Normal affect   EKG:  The EKG was personally reviewed and demonstrates:  NSR, IVCD, no ischemic EKG changes  Relevant CV Studies: TTE (09/2019): 1. Left ventricular ejection fraction, by estimation, is 55 to 60%. The  left ventricle has normal function. The left ventricle has no regional  wall motion abnormalities. There is mild left ventricular hypertrophy.  Left ventricular diastolic parameters  are consistent with Grade I diastolic dysfunction (impaired relaxation).  Elevated left ventricular end-diastolic pressure.  2. Right ventricular systolic function is normal. The right ventricular  size is normal.  3. Left atrial size was severely dilated.  4. The mitral valve is abnormal. Trivial mitral valve regurgitation. The  mean mitral valve gradient is 3.0 mmHg.  5. The aortic valve is tricuspid. Aortic valve regurgitation is trivial.  Moderate aortic valve stenosis. Aortic valve area, by VTI measures 1.03  cm. Aortic valve mean gradient measures 17.6 mmHg. Aortic valve Vmax  measures 2.83 m/s.  6. The inferior vena cava is normal in size with greater than 50%  respiratory variability, suggesting right atrial pressure of 3 mmHg.   Laboratory Data:  High Sensitivity Troponin:     Recent Labs  Lab 04/04/20 1805  TROPONINIHS 302*     Chemistry Recent Labs  Lab 04/04/20 1805  NA 139  K 3.9  CL 103  CO2 22  GLUCOSE 140*  BUN 53*  CREATININE 2.57*  CALCIUM 9.2  GFRNONAA 21*  ANIONGAP 14    Recent Labs  Lab 04/04/20 1805  PROT 6.4*  ALBUMIN 3.3*  AST 25  ALT 22  ALKPHOS 63  BILITOT 2.3*   Hematology Recent Labs  Lab 04/04/20 1805  WBC 7.3  RBC 2.01*  HGB 7.8*  HCT 24.6*  MCV 122.4*  MCH 38.8*  MCHC 31.7  RDW 27.9*  PLT 24*   BNP Recent Labs  Lab 04/04/20 1805  BNP 1,658.0*    DDimer No results for input(s): DDIMER in the last 168 hours.   Radiology/Studies:  DG Chest Port 1 View  Result Date: 04/04/2020 CLINICAL DATA:  Shortness of breath EXAM: PORTABLE CHEST 1 VIEW COMPARISON:  12/04/2019 FINDINGS: Cardiac shadow is enlarged but stable. Aortic calcifications are again seen. Mild vascular congestion and interstitial edema is seen particularly  in the right lung. No sizable effusion is noted. No bony abnormality is seen. Old left clavicular fracture with healing is noted. IMPRESSION: Increased vascular congestion with evidence of mild interstitial edema. Electronically Signed   By: Inez Catalina M.D.   On: 04/04/2020 18:45   Assessment and Plan:   KMARI HALTER is a 86 y.o. male with a hx of DLBCL, CML, CKD III/IV, and HFpEF who is being seen today for the evaluation of worsening SOB likely due to acute on chronic diastolic heart failure.   Mr. Shimabukuro is presenting with at least a week (although seems like multiple weeks) of progressive SOB. He is clearly volume overloaded on exam with an elevated BNP + pulmonary edema on CXR. Not clear to me exactly what may have trigger this exacerbation but UTI possibly contributing. Chronic and progressive anemia certainly contributing to symptoms, and I suspect transfusion would help partially ameliorate his symptoms.   His troponins are elevated but flat in the setting of an AKI. There are  no ischemic changes on his EKG and this is almost certainly due to decompensated heart failure/anemia. His renal function should improve with diuresis.   Plan: - IV lasix this evening (likely will need at least 60 with current GFR). Would target 3-5L UOP over the next 24 hours  - Reasonable to transfuse 1 unit PRBCs (ideally would give lasix after) - Agree with empiric UTI treatment   New York Heart Association (NYHA) Functional Class NYHA Class III   For questions or updates, please contact [facility] Please consult [email or web address] for contact info under    Signed, Ronaldo Miyamoto, MD  04/04/2020 8:20 PM

## 2020-04-05 ENCOUNTER — Inpatient Hospital Stay (HOSPITAL_COMMUNITY): Payer: Medicare Other

## 2020-04-05 ENCOUNTER — Encounter (HOSPITAL_COMMUNITY): Payer: Self-pay | Admitting: Internal Medicine

## 2020-04-05 DIAGNOSIS — I5041 Acute combined systolic (congestive) and diastolic (congestive) heart failure: Secondary | ICD-10-CM

## 2020-04-05 DIAGNOSIS — I350 Nonrheumatic aortic (valve) stenosis: Secondary | ICD-10-CM | POA: Diagnosis not present

## 2020-04-05 DIAGNOSIS — I5033 Acute on chronic diastolic (congestive) heart failure: Secondary | ICD-10-CM | POA: Diagnosis not present

## 2020-04-05 DIAGNOSIS — I248 Other forms of acute ischemic heart disease: Secondary | ICD-10-CM | POA: Diagnosis not present

## 2020-04-05 DIAGNOSIS — D649 Anemia, unspecified: Secondary | ICD-10-CM | POA: Diagnosis not present

## 2020-04-05 DIAGNOSIS — C9310 Chronic myelomonocytic leukemia not having achieved remission: Secondary | ICD-10-CM | POA: Diagnosis not present

## 2020-04-05 LAB — CBC
HCT: 27.2 % — ABNORMAL LOW (ref 39.0–52.0)
Hemoglobin: 8.8 g/dL — ABNORMAL LOW (ref 13.0–17.0)
MCH: 36.2 pg — ABNORMAL HIGH (ref 26.0–34.0)
MCHC: 32.4 g/dL (ref 30.0–36.0)
MCV: 111.9 fL — ABNORMAL HIGH (ref 80.0–100.0)
Platelets: 27 10*3/uL — CL (ref 150–400)
RBC: 2.43 MIL/uL — ABNORMAL LOW (ref 4.22–5.81)
RDW: 27.6 % — ABNORMAL HIGH (ref 11.5–15.5)
WBC: 8.2 10*3/uL (ref 4.0–10.5)
nRBC: 6.9 % — ABNORMAL HIGH (ref 0.0–0.2)

## 2020-04-05 LAB — ECHOCARDIOGRAM COMPLETE
AR max vel: 1.03 cm2
AV Area VTI: 0.96 cm2
AV Area mean vel: 0.92 cm2
AV Mean grad: 18 mmHg
AV Peak grad: 32.5 mmHg
Ao pk vel: 2.85 m/s
Area-P 1/2: 6.32 cm2
S' Lateral: 3.3 cm

## 2020-04-05 LAB — BASIC METABOLIC PANEL
Anion gap: 14 (ref 5–15)
BUN: 47 mg/dL — ABNORMAL HIGH (ref 8–23)
CO2: 27 mmol/L (ref 22–32)
Calcium: 9.5 mg/dL (ref 8.9–10.3)
Chloride: 100 mmol/L (ref 98–111)
Creatinine, Ser: 2.11 mg/dL — ABNORMAL HIGH (ref 0.61–1.24)
GFR, Estimated: 27 mL/min — ABNORMAL LOW (ref >60)
Glucose, Bld: 109 mg/dL — ABNORMAL HIGH (ref 70–99)
Potassium: 3.7 mmol/L (ref 3.5–5.1)
Sodium: 141 mmol/L (ref 135–145)

## 2020-04-05 LAB — URINE CULTURE: Culture: <10000 — AB

## 2020-04-05 LAB — LACTIC ACID, PLASMA: Lactic Acid, Venous: 2.1 mmol/L (ref 0.5–1.9)

## 2020-04-05 LAB — PATHOLOGIST SMEAR REVIEW: Path Review: INCREASED

## 2020-04-05 LAB — SAVE SMEAR(SSMR), FOR PROVIDER SLIDE REVIEW

## 2020-04-05 MED ORDER — PERFLUTREN LIPID MICROSPHERE
1.0000 mL | INTRAVENOUS | Status: AC | PRN
  Administered 2020-04-05: 2 mL via INTRAVENOUS
  Filled 2020-04-05: qty 10

## 2020-04-05 MED ORDER — FOLIC ACID 1 MG PO TABS
2.0000 mg | ORAL_TABLET | Freq: Every day | ORAL | Status: DC
  Administered 2020-04-05 – 2020-04-10 (×6): 2 mg via ORAL
  Filled 2020-04-05 (×5): qty 2

## 2020-04-05 MED ORDER — GABAPENTIN 300 MG PO CAPS
300.0000 mg | ORAL_CAPSULE | Freq: Two times a day (BID) | ORAL | Status: DC
  Administered 2020-04-06 – 2020-04-10 (×9): 300 mg via ORAL
  Filled 2020-04-05 (×9): qty 1

## 2020-04-05 NOTE — Consult Note (Addendum)
[place]  [phone] [phone]    [place]  Reason for Consultation: CMMoL, history of diffuse large cell non-Hodgkin's lymphoma  HPI: [name] is a 86 year old male with a past medical history significant for DLBCL currently in remission, CMML/MDS, transfusion dependent, CHF, renal insufficiency.  The patient presented to the emergency room with tremors of his arms and legs.  Labs on admission showed a normal WBC at 7.3, hemoglobin 7.8, and platelet count 24,000.  His BUN was elevated at 53 and creatinine was elevated at 2.7.  His T bili was also elevated at 2.3.  BNP was found to be elevated at 1658.  Reticulocyte count percentage elevated at 5.1%, absolute reticulocyte count normal at 9.3, and immature reticulocyte fraction elevated at 36%.  The patient has received 1 unit PRBCs.  The patient was recently on Aranesp 100 mcg subcu every 3 weeks which was stopped on 02/12/2020.  He was started on Luspatercept 1 mg/m on 03/03/2020.  He has received 2 doses so far.  He is also taking folic acid 2 mg daily.  The patient was seen in the emergency room today.  No family at the bedside.  He continues to have mild tremors, but overall thinks that he feels slightly better than when he came in.  He is not having any headaches but does have some intermittent dizziness when standing.  Denies chest pain, shortness of breath, abdominal pain, nausea, vomiting.  He reports that he bruises easily, but he has not noticed any bleeding such as epistaxis, hemoptysis, hematemesis, hematuria, melena, hematochezia.  He reported that he had lower extremity edema prior to admission.  Although he still has swollen legs today, he is not certain if these are any better than prior to when he came into the hospital.  Hematology was asked see the patient to make recommendations regarding his CMML, anemia, and thrombocytopenia.  Past Medical History:  Diagnosis Date  . Acute  exacerbation of CHF (congestive heart failure) ([place]) 09/25/2019  . Anemia   . Anemia due to antineoplastic chemotherapy 08/15/2015  . Anxiety   . Arthritis   . Cataract   . Chronic kidney disease (CKD), stage III (moderate) ([facility]) 02/09/2015   Controlled  . CMML (chronic myelomonocytic leukemia) ([place])    gets Aranesp about monthly, otherwise surveillance with Dr. Marin Olp  . Combined fat and carbohydrate induced hyperlipemia 02/09/2015   Controlled  . Depression   . DLBCL (diffuse large B cell lymphoma) ([place]) 08/15/2015  . Essential (primary) hypertension 02/09/2015  . Gastro-esophageal reflux disease without esophagitis 02/09/2015   Controlled  . Renal insufficiency   . Tinnitus   :    Past Surgical History:  Procedure Laterality Date  . CATARACT EXTRACTION, BILATERAL    . COLONOSCOPY  May 2011  . CYSTOSCOPY    . TOTAL HIP ARTHROPLASTY  2003   Right   . TOTAL HIP ARTHROPLASTY  April, 2011   Left  . TRIGGER FINGER RELEASE  Nov 2013  . TURBINECTOMY  2006  . UPPER GI ENDOSCOPY  Nov 2015  :   CURRENT MEDS: Current Facility-Administered Medications  Medication Dose Route Frequency Provider Last Rate Last Admin  . acetaminophen (TYLENOL) tablet 650 mg  650 mg Oral Q6H PRN Etta Quill, DO       Or  . acetaminophen (TYLENOL) suppository 650 mg  650 mg Rectal Q6H PRN Etta Quill, DO      . ALPRAZolam Duanne Moron) tablet 0.5 mg  0.5  mg Oral TID PRN Etta Quill, DO   0.5 mg at 04/05/20 0143  . ascorbic acid (VITAMIN C) tablet 1,000 mg  1,000 mg Oral Daily Jennette Kettle M, DO   1,000 mg at 04/04/20 2355  . calcium carbonate (OS-CAL - dosed in mg of elemental calcium) tablet 1,250 mg  1,250 mg Oral Q breakfast Etta Quill, DO   1,250 mg at 04/05/20 2409  . cholecalciferol (VITAMIN D3) tablet 2,000 Units  2,000 Units Oral Daily Etta Quill, DO   2,000 Units at 04/04/20 2356  . folic acid (FOLVITE) tablet 1 mg  1 mg Oral Daily Jennette Kettle M, DO   1 mg at 04/04/20 2356    . furosemide (LASIX) injection 60 mg  60 mg Intravenous Daily Jennette Kettle M, DO      . gabapentin (NEURONTIN) capsule 300 mg  300 mg Oral TID Etta Quill, DO   300 mg at 04/04/20 2356  . HYDROcodone-acetaminophen (NORCO) 7.5-325 MG per tablet 1 tablet  1 tablet Oral Q6H PRN Etta Quill, DO      . melatonin tablet 10 mg  10 mg Oral QHS Jennette Kettle M, DO   10 mg at 04/04/20 2356  . ondansetron (ZOFRAN) tablet 4 mg  4 mg Oral Q6H PRN Etta Quill, DO       Or  . ondansetron [facility]) injection 4 mg  4 mg Intravenous Q6H PRN Etta Quill, DO      . polyethylene glycol (MIRALAX / GLYCOLAX) packet 17 g  17 g Oral Daily PRN Etta Quill, DO      . polyvinyl alcohol (LIQUIFILM TEARS) 1.4 % ophthalmic solution 1-2 drop  1-2 drop Both Eyes TID PRN Etta Quill, DO      . zinc sulfate capsule 220 mg  220 mg Oral Daily Jennette Kettle M, DO   220 mg at 04/04/20 2355   Current Outpatient Medications  Medication Sig Dispense Refill  . allopurinol (ZYLOPRIM) 100 MG tablet TAKE 2 TABLETS(200 MG) BY MOUTH DAILY (Patient taking differently: Take 200 mg by mouth daily with breakfast. ) 180 tablet 3  . ALPRAZolam (XANAX) 0.5 MG tablet TAKE 1 TABLET(0.5 MG) BY MOUTH THREE TIMES DAILY AS NEEDED FOR ANXIETY (Patient taking differently: Take 0.5 mg by mouth 3 (three) times daily as needed for anxiety. ) 90 tablet 3  . Artificial Tear Ointment (DRY EYES OP) Apply 1 drop to eye daily as needed (for dry eyes).    Marland Kitchen b complex vitamins tablet Take 1 tablet by mouth daily.    . baclofen (LIORESAL) 10 MG tablet Take 1/2 to 1 by mouth every 8hrs as needed for spasm 60 tablet 0  . bethanechol (URECHOLINE) 25 MG tablet TAKE 1 TABLET(25 MG) BY MOUTH THREE TIMES DAILY (Patient taking differently: Take 25 mg by mouth 3 (three) times daily. ) 90 tablet 11  . Calcium 500 MG tablet Take 500 mg by mouth daily.     . Cholecalciferol (VITAMIN D3) 2000 UNITS capsule Take 2,000 Units by mouth daily.    .  Cyanocobalamin (VITAMIN B-12 PO) Take 1 tablet by mouth daily after breakfast.    . fluorouracil (EFUDEX) 5 % cream Apply 1 application topically.     . folic acid (FOLVITE) 1 MG tablet TAKE 2 TABLETS(2 MG) BY MOUTH DAILY (Patient taking differently: Take 1 mg by mouth daily. ) 60 tablet 4  . furosemide (LASIX) 40 MG tablet Take 1 tablet (40 mg  total) by mouth daily as needed for edema. 30 tablet 1  . gabapentin (NEURONTIN) 300 MG capsule TAKE 1 CAPSULE BY MOUTH THREE TIMES DAILY PLUS 2 CAPSULES AS NEEDED (Patient taking differently: Take 300 mg by mouth 3 (three) times daily. ) 150 capsule 3  . HYDROcodone-acetaminophen (NORCO) 7.5-325 MG tablet Take 1 tablet by mouth every 6 (six) hours as needed for moderate pain. 100 tablet 0  . Melatonin 10 MG TABS Take 20 mg by mouth at bedtime.     Marland Kitchen oxyCODONE-acetaminophen (PERCOCET/ROXICET) 5-325 MG tablet Take 1 tablet by mouth every 6 (six) hours as needed for severe pain.    Vladimir Faster Glyc-Propyl Glyc PF (SYSTANE ULTRA PF) 0.4-0.3 % SOLN Place 1-2 drops into both eyes 3 (three) times daily as needed (for dryness).     . polyethylene glycol (MIRALAX / GLYCOLAX) packet Take 17 g by mouth daily as needed for mild constipation.     . Potassium Chloride ER 20 MEQ TBCR Take 20 mEq by mouth daily. 30 tablet 1  . Pyridoxine HCl (VITAMIN B-6 PO) Take 1 tablet by mouth daily after breakfast.    . vitamin C (ASCORBIC ACID) 500 MG tablet Take 1,000 mg by mouth daily.     Marland Kitchen zinc gluconate 50 MG tablet Take 50 mg by mouth daily.        Allergies  Allergen Reactions  . Nitroglycerin Other (See Comments)    "I ended up in a fetal position from the pain"  . Nsaids Other (See Comments) and Tinitus    Bruising   . Statins Nausea And Vomiting and Other (See Comments)    Kidney issues and muscle pain   . Tolmetin Other (See Comments)    Bruising  . Ciprofloxacin Other (See Comments)    Achilles tendon issues  . Ropinirole Anxiety and Other (See Comments)     Mood swings, also  . Requip [Ropinirole Hcl] Anxiety  :  Family History  Problem Relation Age of Onset  . Anuerysm Mother 2       Deceased  . Colon cancer Father 43       Deceased  . Colon cancer Brother   . Prostate cancer Brother        Deceased  . Alzheimer's disease Brother   . Heart disease Brother   . Alzheimer's disease Paternal Grandmother   . Early death Maternal Grandmother        Unknown  . Obesity Son   . Obesity Son        Gastric Bypass  . Healthy Son   . Healthy Daughter   . Diabetes Neg Hx   :  Social History   Socioeconomic History  . Marital status: Widowed    Spouse name: Not on file  . Number of children: Not on file  . Years of education: Not on file  . Highest education level: Not on file  Occupational History  . Not on file  Tobacco Use  . Smoking status: Former Research scientist (life sciences)  . Smokeless tobacco: Never Used  . Tobacco comment: Quit >50 years ago  Vaping Use  . Vaping Use: Never used  Substance and Sexual Activity  . Alcohol use: Yes    Alcohol/week: 1.0 - 4.0 standard drink    Types: 1 - 4 Shots of liquor per week    Comment: 4 glasses per week  . Drug use: No  . Sexual activity: Not on file  Other Topics Concern  . Not on file  Social History Narrative  . Not on file   Social Determinants of Health   Financial Resource Strain: Low Risk   . Difficulty of Paying Living Expenses: Not hard at all  Food Insecurity: No Food Insecurity  . Worried About Charity fundraiser in the Last Year: Never true  . Ran Out of Food in the Last Year: Never true  Transportation Needs: No Transportation Needs  . Lack of Transportation (Medical): No  . Lack of Transportation (Non-Medical): No  Physical Activity:   . Days of Exercise per Week: Not on file  . Minutes of Exercise per Session: Not on file  Stress:   . Feeling of Stress : Not on file  Social Connections:   . Frequency of Communication with Friends and Family: Not on file  . Frequency of  Social Gatherings with Friends and Family: Not on file  . Attends Religious Services: Not on file  . Active Member of Clubs or Organizations: Not on file  . Attends Archivist Meetings: Not on file  . Marital Status: Not on file  Intimate Partner Violence:   . Fear of Current or Ex-Partner: Not on file  . Emotionally Abused: Not on file  . Physically Abused: Not on file  . Sexually Abused: Not on file  :  REVIEW OF SYSTEMS:  A comprehensive 14 point review of systems was negative except as noted in the HPI.    Exam: Patient Vitals for the past 24 hrs:  BP Temp Temp src Pulse Resp SpO2  04/05/20 0807 110/71 -- -- 88 14 100 %  04/05/20 0745 (!) 106/59 -- -- 85 (!) 30 100 %  04/05/20 0630 113/84 -- -- 93 19 91 %  04/05/20 0615 139/84 -- -- 88 (!) 22 99 %  04/05/20 0600 109/64 -- -- 77 11 99 %  04/05/20 0545 111/62 -- -- 79 12 97 %  04/05/20 0530 131/78 -- -- 99 18 100 %  04/05/20 0515 112/65 -- -- 77 11 96 %  04/05/20 0422 -- -- -- 93 18 100 %  04/05/20 0421 -- -- -- 96 19 100 %  04/05/20 0420 -- -- -- 93 16 99 %  04/05/20 0419 -- -- -- 94 18 97 %  04/05/20 0415 108/65 -- -- 99 20 95 %  04/05/20 0400 107/61 -- -- 79 12 100 %  04/05/20 0345 110/61 -- -- 77 10 100 %  04/05/20 0330 110/63 -- -- 77 12 100 %  04/05/20 0315 113/72 -- -- 77 16 100 %  04/05/20 0300 120/74 -- -- 83 18 99 %  04/05/20 0245 116/71 -- -- 78 11 100 %  04/05/20 0230 126/71 -- -- 84 18 100 %  04/05/20 0130 109/65 -- -- -- (!) 22 --  04/05/20 0115 109/63 -- -- -- 11 --  04/05/20 0100 111/67 -- -- -- 18 --  04/05/20 0045 112/62 -- -- -- 18 --  04/05/20 0037 -- 97.6 F (36.4 C) Oral -- -- --  04/05/20 0030 106/61 -- -- 81 18 100 %  04/05/20 0015 112/63 97.9 F (36.6 C) Oral 90 19 95 %  04/04/20 2030 117/72 -- -- 89 19 100 %  04/04/20 2000 -- -- -- 89 18 100 %  04/04/20 1845 -- -- -- 96 20 98 %  04/04/20 1804 114/74 97.8 F (36.6 C) Oral 95 16 100 %    General: Elderly male, no distress Eyes:   no scleral icterus.   ENT:  There were no oropharyngeal lesions.   Lymphatics:  Negative cervical, supraclavicular or axillary adenopathy.   Respiratory: lungs were clear bilaterally without wheezing or crackles.   Cardiovascular: 3/6 systolic murmur, trace pitting edema in the bilateral lower extremities GI:  abdomen was soft, flat, nontender, nondistended, without organomegaly.   Skin: Multiple ecchymoses over his bilateral upper and lower extremities.  No petechiae. Neuro exam was nonfocal.  Patient was alert and oriented.  Attention was good.   Language was appropriate.  Mood was normal without depression.  Speech was not pressured.  Thought content was not tangential.    LABS:  Lab Results  Component Value Date   WBC 8.2 04/05/2020   HGB 8.8 (L) 04/05/2020   HCT 27.2 (L) 04/05/2020   PLT 27 (LL) 04/05/2020   GLUCOSE 109 (H) 04/05/2020   CHOL 141 03/26/2019   TRIG 69 03/26/2019   HDL 41 03/26/2019   LDLCALC 86 03/26/2019   ALT 22 04/04/2020   AST 25 04/04/2020   NA 141 04/05/2020   K 3.7 04/05/2020   CL 100 04/05/2020   CREATININE 2.11 (H) 04/05/2020   BUN 47 (H) 04/05/2020   CO2 27 04/05/2020   INR 1.05 11/22/2016    DG Chest Port 1 View  Result Date: 04/04/2020 CLINICAL DATA:  Shortness of breath EXAM: PORTABLE CHEST 1 VIEW COMPARISON:  12/04/2019 FINDINGS: Cardiac shadow is enlarged but stable. Aortic calcifications are again seen. Mild vascular congestion and interstitial edema is seen particularly in the right lung. No sizable effusion is noted. No bony abnormality is seen. Old left clavicular fracture with healing is noted. IMPRESSION: Increased vascular congestion with evidence of mild interstitial edema. Electronically Signed   By: Inez Catalina M.D.   On: 04/04/2020 18:45     ASSESSMENT AND PLAN:  1. CMML 2.  Anemia and thrombocytopenia secondary to #1 3.  Acute on chronic diastolic CHF 4.  AKI on CKD  -The patient has been transfusion dependent.  He was  previously on Aranesp and recently switched to [place].  He has not had significant response to this yet, but has only received 2 doses.  Recommend PRBC transfusion for hemoglobin less than 7.  Will need to consider Lasix with blood products given volume overload. -Baseline platelets appear to be in the 40-60,000 range.  His platelet count has now worsened.  He has not actively bleeding.  Recommend continued monitoring of his platelet count and transfuse for active bleeding or platelet count less than 20,000. -Cardiology following the patient for his acute on chronic diastolic heart failure.  He is receiving Lasix with good urine output.  He continues to have lower extremity edema. -Continue to monitor renal function closely.  Cardiology anticipates renal function will improve with diuresis.   Mikey Bussing, DNP, AGPCNP-BC, AOCNP Mon/Tues/Thurs/Fri 7am-5pm; Off Wednesdays Cell: [phone]  ADDENDUM: I saw and examined Jeremiah Walker this morning.  He is well-known to me.  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  This is really a combination of a myelodysplastic and a myeloproliferative process.  We have started him on Luspatercept.  He has had a couple doses.  Last dose was given on 03/24/2020.  His platelet count is going down.  There is clearly is a problem.  Unfortunately, we have very little recourse for this.  Transfusions probably the only way to try to help thrombocytopenia.  As long as it is not bleeding, I think will be okay without having to transfuse  him.  His renal function is getting better.  His labs today show white cell count 7.8.  Hemoglobin 10.1.  Platelet count 24,000.  I will sure he has been transfused or not yet.  I do not think he has.  His creatinine is 1.88.  BUN is 41.  This is improving.  Again, he is not had any bleeding issues.  We will just have to watch his platelet count.  I think this is an indication as to the  dysfunction of his bone marrow.  He does have a cardiac issues.  Surprisingly, his ejection fraction is only 30-35%.  I am surprised that it is this low.  6 months ago, he had a normal ejection fraction of 55-60%.  He has severely dilated left atrium.  Given that he does have the cardiomyopathy, we will have to try to get his blood count up.  His hemoglobin is below bit better today.  This might be a concentration effect from being on Lasix.  Since he is in the hospital, we will try a dose of Procrit on him.  I think this would be reasonable given his renal function that had been worse.  Overall, he does not look all that bad.  I would like to hope that he should be able to go home soon.  I know that he is a DO NOT RESUSCITATE.  I agree with this designation.  I know that he is getting outstanding care by everybody in the emergency room.  Lattie Haw, MD

## 2020-04-05 NOTE — Progress Notes (Signed)
  Echocardiogram 2D Echocardiogram has been performed.  Jeremiah Walker 04/05/2020, 10:07 AM

## 2020-04-05 NOTE — Progress Notes (Addendum)
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 His last transfusion with packed packed RBCs was 3 weeks ago and his hemoglobin was 8.9 at that time.  He was admitted this morning with tremors of the arms and legs.  He lives in [place].  He had also complained of worsening shortness of breath with progressive generalized weakness and dizziness.  He denied chest pain nausea vomiting or diarrhea.   His hemoglobin on admission 7.8 platelets 24 troponin was [id number] BNP of 1658 creatinine was 2.57 which is up from his baseline of 1.9.  Chest x-ray showed fluid overload.  His white count was 7.  UA showed leukocyte esterase and 11-20 WBCs he was given Rocephin. 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  He was given 1 unit of packed RBC.  Hemoglobin 8.8.  #2 acute on chronic diastolic/systolic CHF on Lasix. Echocardiogram 04/05/2020-ejection fraction 30 to 35%.  The left ventricle has moderately decreased function.  Left ventricle demonstrates global hypokinesis.  Mild concentric left ventricular hypertrophy.  Grade 3 diastolic dysfunction.  Severe hypokinesis of the left ventricular entire inferior wall and inferolateral lateral wall moderate aortic valve  stenosis. Compared to echo in April 2021 his ejection fraction was normal. Check I's and O's and daily weights.  Patient still boarded in the ER  No I's and O's on him.  #3 AKI on CKD stage IIIb likely worsening due to heart failure and anemia.  On IV Lasix monitor closely.  Creatinine 2.1 down from 2.5 on admission with diuresis.  #4 UA follow-up urine culture he received Rocephin in the ER.  #5 thrombocytopenia likely due to MDS.  Platelet count 27.    Estimated body mass index is 27.26 kg/m as calculated from the following:   Height as of 03/15/20: 5\' 10"  (1.778 m).   Weight as of 03/24/20: 86.2 kg.  DVT prophylaxis: SCD  code Status: DO NOT RESUSCITATE  family Communication: Discussed with Dr. Damita Dunnings on the phone  disposition Plan:  Status is: Inpatient  Dispo: The patient is from: ALF              Anticipated d/c is to: SNF              Anticipated d/c date is: 3 days              Patient currently is not medically stable to d/c.    Consultants: Cardiology, oncology Procedures: None Antimicrobials: Rocephin  Subjective: Patient resting in bed he is awake and alert He reports he is feeling bored in the room he feels his breathing is better  Objective: Vitals:   04/05/20 0807 04/05/20 0947 04/05/20 1030 04/05/20 1100  BP: 110/71 (!) 170/102    Pulse: 88 (!) 104 93 79  Resp: 14 20 16 11   Temp:  100 F (37.8 C)  TempSrc:  Rectal    SpO2: 100% 100% 98% 100%    Intake/Output Summary (Last 24 hours) at 04/05/2020 1126 Last data filed at 04/05/2020 0703 Gross per 24 hour  Intake 100 ml  Output 800 ml  Net -700 ml   There were no vitals filed for this visit.  Examination:  General exam: Appears calm and comfortable  Respiratory system diminished breath sounds at the bases to auscultation. Respiratory effort normal. Cardiovascular system: S1 & S2 heard, RRR. No JVD, murmurs, rubs, gallops or clicks. No pedal edema. Gastrointestinal system: Abdomen is  nondistended, soft and nontender. No organomegaly or masses felt. Normal bowel sounds heard. Central nervous system: Alert and oriented. No focal neurological deficits. Extremities: 2+ pitting edema Skin: No rashes, lesions or ulcers Psychiatry: Judgement and insight appear normal. Mood & affect appropriate.     Data Reviewed: I have personally reviewed following labs and imaging studies  CBC: Recent Labs  Lab 04/04/20 1805 04/05/20 0504  WBC 7.3 8.2  NEUTROABS 3.5  --   HGB 7.8* 8.8*  HCT 24.6* 27.2*  MCV 122.4* 111.9*  PLT 24* 27*   Basic Metabolic Panel: Recent Labs  Lab 04/04/20 1805 04/05/20 0504  NA 139 141  K 3.9 3.7  CL 103 100  CO2 22 27  GLUCOSE 140* 109*  BUN 53* 47*  CREATININE 2.57* 2.11*  CALCIUM 9.2 9.5  MG 2.1  --    GFR: Estimated Creatinine Clearance: 24 mL/min (A) (by C-G formula based on SCr of 2.11 mg/dL (H)). Liver Function Tests: Recent Labs  Lab 04/04/20 1805  AST 25  ALT 22  ALKPHOS 63  BILITOT 2.3*  PROT 6.4*  ALBUMIN 3.3*   No results for input(s): LIPASE, AMYLASE in the last 168 hours. No results for input(s): AMMONIA in the last 168 hours. Coagulation Profile: No results for input(s): INR, PROTIME in the last 168 hours. Cardiac Enzymes: No results for input(s): CKTOTAL, CKMB, CKMBINDEX, TROPONINI in the last 168 hours. BNP (last 3 results) Recent Labs    12/02/19 1139  PROBNP 1,021.0*   HbA1C: No results for input(s): HGBA1C in the last 72 hours. CBG: No results for input(s): GLUCAP in the last 168 hours. Lipid Profile: No results for input(s): CHOL, HDL, LDLCALC, TRIG, CHOLHDL, LDLDIRECT in the last 72 hours. Thyroid Function Tests: No results for input(s): TSH, T4TOTAL, FREET4, T3FREE, THYROIDAB in the last 72 hours. Anemia Panel: Recent Labs    04/04/20 2130  RETICCTPCT 5.1*   Sepsis Labs: Recent Labs  Lab 04/04/20 1805 04/05/20 0130  LATICACIDVEN 2.1* 2.1*    Recent Results (from the past 240 hour(s))   Respiratory Panel by RT PCR (Flu A&B, Covid) - Nasopharyngeal Swab     Status: None   Collection Time: 04/04/20  6:11 PM   Specimen: Nasopharyngeal Swab  Result Value Ref Range Status   SARS Coronavirus 2 by RT PCR NEGATIVE NEGATIVE Final    Comment: (NOTE) SARS-CoV-2 target nucleic acids are NOT DETECTED.  The SARS-CoV-2 RNA is generally detectable in upper respiratoy specimens during the acute phase of infection. The lowest concentration of SARS-CoV-2 viral copies this assay can detect is 131 copies/mL. A negative result does not preclude SARS-Cov-2 infection and should not be used as the sole basis for treatment or other patient management decisions. A negative result may occur with  improper specimen collection/handling, submission of specimen other than nasopharyngeal swab, presence of viral mutation(s) within the areas targeted by this assay, and inadequate number of viral  copies (<131 copies/mL). A negative result must be combined with clinical observations, patient history, and epidemiological information. The expected result is Negative.  Fact Sheet for Patients:  PinkCheek.be  Fact Sheet for Healthcare Providers:  GravelBags.it  This test is no t yet approved or cleared by the [place] FDA and  has been authorized for detection and/or diagnosis of SARS-CoV-2 by FDA under an Emergency Use Authorization (EUA). This EUA will remain  in effect (meaning this test can be used) for the duration of the COVID-19 declaration under Section 564(b)(1) of the Act, 21 U.S.C. section 360bbb-3(b)(1), unless the authorization is terminated or revoked sooner.     Influenza A by PCR NEGATIVE NEGATIVE Final   Influenza B by PCR NEGATIVE NEGATIVE Final    Comment: (NOTE) The Xpert Xpress SARS-CoV-2/FLU/RSV assay is intended as an aid in  the diagnosis of influenza from Nasopharyngeal swab specimens and  should not be used as  a sole basis for treatment. Nasal washings and  aspirates are unacceptable for Xpert Xpress SARS-CoV-2/FLU/RSV  testing.  Fact Sheet for Patients: PinkCheek.be  Fact Sheet for Healthcare Providers: GravelBags.it  This test is not yet approved or cleared by the [place] FDA and  has been authorized for detection and/or diagnosis of SARS-CoV-2 by  FDA under an Emergency Use Authorization (EUA). This EUA will remain  in effect (meaning this test can be used) for the duration of the  Covid-19 declaration under Section 564(b)(1) of the Act, 21  U.S.C. section 360bbb-3(b)(1), unless the authorization is  terminated or revoked. Performed at [facility], [place] [address] [place], [place] [address]          Radiology Studies: DG Chest Port 1 View  Result Date: 04/04/2020 CLINICAL DATA:  Shortness of breath EXAM: PORTABLE CHEST 1 VIEW COMPARISON:  12/04/2019 FINDINGS: Cardiac shadow is enlarged but stable. Aortic calcifications are again seen. Mild vascular congestion and interstitial edema is seen particularly in the right lung. No sizable effusion is noted. No bony abnormality is seen. Old left clavicular fracture with healing is noted. IMPRESSION: Increased vascular congestion with evidence of mild interstitial edema. Electronically Signed   By: Inez Catalina M.D.   On: 04/04/2020 18:45   ECHOCARDIOGRAM COMPLETE  Result Date: 04/05/2020    ECHOCARDIOGRAM REPORT   Patient Name:   Jeremiah Walker Date of Exam: 04/05/2020 Medical Rec #:  [id number]        Height:       70.0 in Accession #:    [id number]       Weight:       190.0 lb Date of Birth:  10-10-1927        BSA:          2.042 m Patient Age:    39 years         BP:           110/71 mmHg Patient Gender: M                HR:           88 bpm. Exam Location:  Inpatient Procedure: 2D Echo, Cardiac Doppler, Color Doppler and Intracardiac            Opacification Agent  Indications:    CHF  History:        Patient has prior history of Echocardiogram examinations, most                 recent 09/26/2019. CHF,  Signs/Symptoms:Shortness of Breath; Risk                 Factors:Hypertension. Lymphoma, CKD.  Sonographer:    Dustin Flock Referring Phys: [phone] JARED M GARDNER  Sonographer Comments: Image acquisition challenging due to uncooperative patient. IMPRESSIONS  1. Left ventricular ejection fraction, by estimation, is 30 to 35%. The left ventricle has moderately decreased function. The left ventricle demonstrates global hypokinesis. There is mild concentric left ventricular hypertrophy. Left ventricular diastolic parameters are consistent with Grade III diastolic dysfunction (restrictive). Elevated left atrial pressure. There is severe hypokinesis of the left ventricular, entire inferior wall and inferolateral wall.  2. Right ventricular systolic function is normal. The right ventricular size is normal. There is normal pulmonary artery systolic pressure. The estimated right ventricular systolic pressure is 90.2 mmHg.  3. Left atrial size was severely dilated.  4. The mitral valve is normal in structure. Mild mitral valve regurgitation. Moderate mitral annular calcification.  5. The aortic valve is tricuspid. There is moderate calcification of the aortic valve. There is moderate thickening of the aortic valve. Aortic valve regurgitation is not visualized. Moderate aortic valve stenosis. Comparison(s): The left ventricular function is significantly worse. The left ventricular diastolic function is significantly worse. The left ventricular wall motion is new. Aortic valve gradients are underestimated due topoor left ventricular function, but aortic stenosis is probably moderate. Aortic cusp mobility is only moderately restricted. FINDINGS  Left Ventricle: Left ventricular ejection fraction, by estimation, is 30 to 35%. The left ventricle has moderately decreased function. The left  ventricle demonstrates global hypokinesis. Severe hypokinesis of the left ventricular, entire inferior wall and inferolateral wall. Definity contrast agent was given IV to delineate the left ventricular endocardial borders. The left ventricular internal cavity size was normal in size. There is mild concentric left ventricular hypertrophy. Left ventricular diastolic parameters are consistent with Grade III diastolic dysfunction (restrictive). Elevated left atrial pressure. Right Ventricle: The right ventricular size is normal. No increase in right ventricular wall thickness. Right ventricular systolic function is normal. There is normal pulmonary artery systolic pressure. The tricuspid regurgitant velocity is 2.35 m/s, and  with an assumed right atrial pressure of 3 mmHg, the estimated right ventricular systolic pressure is 40.9 mmHg. Left Atrium: Left atrial size was severely dilated. Right Atrium: Right atrial size was normal in size. Pericardium: There is no evidence of pericardial effusion. Mitral Valve: The mitral valve is normal in structure. There is moderate thickening of the mitral valve leaflet(s). There is mild calcification of the mitral valve leaflet(s). Moderate mitral annular calcification. Mild mitral valve regurgitation, with centrally-directed jet. Tricuspid Valve: The tricuspid valve is normal in structure. Tricuspid valve regurgitation is trivial. Aortic Valve: The aortic valve is tricuspid. There is moderate calcification of the aortic valve. There is moderate thickening of the aortic valve. Aortic valve regurgitation is not visualized. Moderate aortic stenosis is present. Aortic valve mean gradient measures 18.0 mmHg. Aortic valve peak gradient measures 32.5 mmHg. Aortic valve area, by VTI measures 0.96 cm. Pulmonic Valve: The pulmonic valve was not well visualized. Pulmonic valve regurgitation is not visualized. Aorta: The aortic root is normal in size and structure. IAS/Shunts: No atrial  level shunt detected by color flow Doppler.  LEFT VENTRICLE PLAX 2D LVIDd:         4.00 cm  Diastology LVIDs:         3.30 cm  LV e' medial:    3.59 cm/s LV PW:  1.30 cm  LV E/e' medial:  32.9 LV IVS:        1.35 cm  LV e' lateral:   6.96 cm/s LVOT diam:     2.30 cm  LV E/e' lateral: 17.0 LV SV:         60 LV SV Index:   29 LVOT Area:     4.15 cm  RIGHT VENTRICLE RV Basal diam:  3.30 cm RV S prime:     10.80 cm/s TAPSE (M-mode): 2.2 cm LEFT ATRIUM             Index       RIGHT ATRIUM           Index LA diam:        4.70 cm 2.30 cm/m  RA Area:     13.70 cm LA Vol (A2C):   35.8 ml 17.53 ml/m RA Volume:   28.90 ml  14.15 ml/m LA Vol (A4C):   89.9 ml 44.02 ml/m LA Biplane Vol: 61.6 ml 30.16 ml/m  AORTIC VALVE AV Area (Vmax):    1.03 cm AV Area (Vmean):   0.92 cm AV Area (VTI):     0.96 cm AV Vmax:           285.00 cm/s AV Vmean:          190.000 cm/s AV VTI:            0.626 m AV Peak Grad:      32.5 mmHg AV Mean Grad:      18.0 mmHg LVOT Vmax:         70.80 cm/s LVOT Vmean:        42.300 cm/s LVOT VTI:          0.144 m LVOT/AV VTI ratio: 0.23  AORTA Ao Root diam: 2.90 cm MITRAL VALVE                TRICUSPID VALVE MV Area (PHT): 6.32 cm     TR Peak grad:   22.1 mmHg MV Decel Time: 120 msec     TR Vmax:        235.00 cm/s MV E velocity: 118.00 cm/s MV A velocity: 35.00 cm/s   SHUNTS MV E/A ratio:  3.37         Systemic VTI:  0.14 m                             Systemic Diam: 2.30 cm Dani Gobble Croitoru MD Electronically signed by Sanda Klein MD Signature Date/Time: 04/05/2020/10:57:14 AM    Final         Scheduled Meds: . vitamin C  1,000 mg Oral Daily  . calcium carbonate  1,250 mg Oral Q breakfast  . cholecalciferol  2,000 Units Oral Daily  . folic acid  1 mg Oral Daily  . furosemide  60 mg Intravenous Daily  . gabapentin  300 mg Oral TID  . melatonin  10 mg Oral QHS  . zinc sulfate  220 mg Oral Daily   Continuous Infusions:   LOS: 1 day     Georgette Shell, MD  04/05/2020,  11:26 AM

## 2020-04-05 NOTE — Progress Notes (Signed)
Progress Note  Patient Name: Jeremiah Walker Date of Encounter: 04/05/2020  Primary Cardiologist: Donato Heinz, MD   Subjective   Feeling tired and cold.  Breathing is improving.  Denies chest pain or pressure.   Inpatient Medications    Scheduled Meds: . vitamin C  1,000 mg Oral Daily  . calcium carbonate  1,250 mg Oral Q breakfast  . cholecalciferol  2,000 Units Oral Daily  . folic acid  1 mg Oral Daily  . furosemide  60 mg Intravenous Daily  . gabapentin  300 mg Oral TID  . melatonin  10 mg Oral QHS  . zinc sulfate  220 mg Oral Daily   Continuous Infusions:  PRN Meds: acetaminophen **OR** acetaminophen, ALPRAZolam, HYDROcodone-acetaminophen, ondansetron **OR** ondansetron (ZOFRAN) IV, polyethylene glycol, polyvinyl alcohol   Vital Signs    Vitals:   04/05/20 0630 04/05/20 0745 04/05/20 0807 04/05/20 0947  BP: 113/84 (!) 106/59 110/71 (!) 170/102  Pulse: 93 85 88 (!) 104  Resp: 19 (!) 30 14 20   Temp:    100 F (37.8 C)  TempSrc:    Rectal  SpO2: 91% 100% 100% 100%    Intake/Output Summary (Last 24 hours) at 04/05/2020 1001 Last data filed at 04/05/2020 0703 Gross per 24 hour  Intake 100 ml  Output 800 ml  Net -700 ml   There were no vitals filed for this visit.  Telemetry    Sinus rhythm.  Multifocal PVCs.  - Personally Reviewed  ECG    Sinus rhythm.  Rate 92 bpm.  IVCD. - Personally Reviewed  Physical Exam   VS:  BP (!) 170/102 (BP Location: Right Arm)   Pulse 96   Temp 100 F (37.8 C) (Rectal)   Resp 16   SpO2 98%  , BMI There is no height or weight on file to calculate BMI. GENERAL:  Well appearing HEENT: Pupils equal round and reactive, fundi not visualized, oral mucosa unremarkable NECK:  No jugular venous distention, waveform within normal limits, carotid upstroke brisk and symmetric, no bruits LUNGS:  Clear to auscultation bilaterally HEART:  RRR.  PMI not displaced or sustained,S1 and S2 within normal limits, no S3, no S4,  no clicks, no rubs, III/VI mid-late peaking systolic murmur at the LUSB ABD:  Flat, positive bowel sounds normal in frequency in pitch, no bruits, no rebound, no guarding, no midline pulsatile mass, no hepatomegaly, no splenomegaly EXT:  2 plus pulses throughout, 1+ LE edema to lower tibia bilaterally. No cyanosis no clubbing SKIN:  No rashes no nodules NEURO:  Cranial nerves II through XII grossly intact, motor grossly intact throughout [facility]:  Cognitively intact, oriented to person place and time   Labs    Chemistry Recent Labs  Lab 04/04/20 1805 04/05/20 0504  NA 139 141  K 3.9 3.7  CL 103 100  CO2 22 27  GLUCOSE 140* 109*  BUN 53* 47*  CREATININE 2.57* 2.11*  CALCIUM 9.2 9.5  PROT 6.4*  --   ALBUMIN 3.3*  --   AST 25  --   ALT 22  --   ALKPHOS 63  --   BILITOT 2.3*  --   GFRNONAA 21* 27*  ANIONGAP 14 14     Hematology Recent Labs  Lab 04/04/20 1805 04/04/20 2130 04/05/20 0504  WBC 7.3  --  8.2  RBC 2.01* 1.94* 2.43*  HGB 7.8*  --  8.8*  HCT 24.6*  --  27.2*  MCV 122.4*  --  111.9*  MCH 38.8*  --  36.2*  MCHC 31.7  --  32.4  RDW 27.9*  --  27.6*  PLT 24*  --  27*    Cardiac EnzymesNo results for input(s): TROPONINI in the last 168 hours. No results for input(s): TROPIPOC in the last 168 hours.   BNP Recent Labs  Lab 04/04/20 1805  BNP 1,658.0*     DDimer No results for input(s): DDIMER in the last 168 hours.   Radiology    DG Chest Port 1 View  Result Date: 04/04/2020 CLINICAL DATA:  Shortness of breath EXAM: PORTABLE CHEST 1 VIEW COMPARISON:  12/04/2019 FINDINGS: Cardiac shadow is enlarged but stable. Aortic calcifications are again seen. Mild vascular congestion and interstitial edema is seen particularly in the right lung. No sizable effusion is noted. No bony abnormality is seen. Old left clavicular fracture with healing is noted. IMPRESSION: Increased vascular congestion with evidence of mild interstitial edema. Electronically Signed   By:  Inez Catalina M.D.   On: 04/04/2020 18:45    Cardiac Studies   Echo 04/05/20: 1. Left ventricular ejection fraction, by estimation, is 30 to 35%. The  left ventricle has moderately decreased function. The left ventricle  demonstrates global hypokinesis. There is mild concentric left ventricular  hypertrophy. Left ventricular  diastolic parameters are consistent with Grade III diastolic dysfunction  (restrictive). Elevated left atrial pressure. There is severe hypokinesis  of the left ventricular, entire inferior wall and inferolateral wall.  2. Right ventricular systolic function is normal. The right ventricular  size is normal. There is normal pulmonary artery systolic pressure. The  estimated right ventricular systolic pressure is 35.3 mmHg.  3. Left atrial size was severely dilated.  4. The mitral valve is normal in structure. Mild mitral valve  regurgitation. Moderate mitral annular calcification.  5. The aortic valve is tricuspid. There is moderate calcification of the  aortic valve. There is moderate thickening of the aortic valve. Aortic  valve regurgitation is not visualized. Moderate aortic valve stenosis.   Echo 09/26/19: 1. Left ventricular ejection fraction, by estimation, is 55 to 60%. The  left ventricle has normal function. The left ventricle has no regional  wall motion abnormalities. There is mild left ventricular hypertrophy.  Left ventricular diastolic parameters  are consistent with Grade I diastolic dysfunction (impaired relaxation).  Elevated left ventricular end-diastolic pressure.  2. Right ventricular systolic function is normal. The right ventricular  size is normal.  3. Left atrial size was severely dilated.  4. The mitral valve is abnormal. Trivial mitral valve regurgitation. The  mean mitral valve gradient is 3.0 mmHg.  5. The aortic valve is tricuspid. Aortic valve regurgitation is trivial.  Moderate aortic valve stenosis. Aortic valve area, by  VTI measures 1.03  cm. Aortic valve mean gradient measures 17.6 mmHg. Aortic valve Vmax  measures 2.83 m/s.  6. The inferior vena cava is normal in size with greater than 50%  respiratory variability, suggesting right atrial pressure of 3 mmHg.   Patient Profile     86 y.o. male with a PMH of chronic diastolic CHF, HEN, DLBCL, CML, CKD III/IV, who is being followed by cardiology for the evaluation of worsening SOB likely due to acute on chronic diastolic heart failure.   Assessment & Plan    # Acute systolic and diastolic heart failure: # Hypertension:  LVEF 30-35% with global hypokinesis worse in the inferior and inferolateral myocardium.  This is new compared with echo 09/2019.  He is feeling better with diuresis  but remains volume overloaded.  His next dose of lasix is pending.  BP is very labile.  Continue to monitor for now and add beta blocker, hydralazine/nitrate as able.  Avoiding ARB in the setting of CKD and diuresis.   # Elevated troponin: HS-troponin is elevated to 300 and flat.  Mr. Muckey has no chest pain.  Likely elevated in the setting of demand ischemia.  He is not a good candidate for cath given his chronic anemia and thrombocytopenia.  Plan for medical management in light of his age and lack of symptoms. No aspirin or heparin 2/2 thrombocytopenia.  # Moderate aortic stenosis:  Mean gradient 18 mmHg, unchanged from 09/2019.  Therefore it is unlikely to be low-flow, low-gradient severe AS.  For questions or updates, please contact [facility] Please consult [email or web address] for contact info under Cardiology/STEMI.      Tessie Ordaz C. Oval Linsey, MD, [facility]

## 2020-04-05 NOTE — ED Notes (Signed)
Patient transported to Ultrasound 

## 2020-04-05 NOTE — Evaluation (Signed)
Physical Therapy Evaluation Patient Details Name: Jeremiah Walker MRN: [id number] DOB: Jul 06, 1927 Today's Date: 04/05/2020   History of Present Illness  Pt is 86 y.o. male with medical history significant of DLBCL in remission, CMML / MDS, transfusion dependent.  Looks like this has been progressing with worsening thrombocytopenia over the past month or two.  He was recently switched him from [place] to [place] in an attempt to treat.  Last PRBC transfusion was 3 weeks ago which brought HGB from 8.1 to 8.9.  Pt admitted with symptomatic anemia and has had 1 unit PRBC.  Did not platelets are 27.  Clinical Impression   Pt admitted with above diagnosis. Pt requiring min guard to min A for transfers and ambulation today. Required min cues for safety.  Some limitations due to lines/leads and seen in ED.  Pt required 3 LPM O2 for stable sats with activity.  Pt is from independent living facility but has had declining mobility over the past couple of weeks.  He was found to have decreased mobiltiy, balance, safety, cardiopulmonary endurance, and strength during evaluation.  Pt was already considering transition to higher level of care at baseline, and would benefit from possible SNF placement with transition to ALF when able. Pt currently with functional limitations due to the deficits listed below (see PT Problem List). Pt will benefit from skilled PT to increase their independence and safety with mobility to allow discharge to the venue listed below.       Follow Up Recommendations SNF (SNF with possible transition to ALF from ILF after SNF)    Equipment Recommendations  None recommended by PT    Recommendations for Other Services       Precautions / Restrictions Precautions Precautions: Fall Precaution Comments: Low Platelets      Mobility  Bed Mobility Overal bed mobility: Needs Assistance Bed Mobility: Supine to Sit;Sit to Supine     Supine to sit: Min assist Sit to supine: Min  assist      Transfers Overall transfer level: Needs assistance Equipment used: 1 person hand held assist Transfers: Sit to/from Stand Sit to Stand: Min guard         General transfer comment: performed x 4; min g for safety  Ambulation/Gait Ambulation/Gait assistance: Min assist Gait Distance (Feet): 20 Feet (20'x2) Assistive device: 1 person hand held assist Gait Pattern/deviations: Step-to pattern;Decreased stride length;Trunk flexed Gait velocity: decreased   General Gait Details: Ambulated 20' on 2 LPM (what pt was on at arrival) with sats down to 86% but recovered in 30 seconds of rest.  Son reports pt on 3 LPM O2 at home.  Increased to 3 LPM and sats maintained >90% with activity.  Notified RN pt on 3 L at home.  With gait pt with mild instability requiring HHA and min A for balance.  He required cues for posture and safety.  Stairs            Wheelchair Mobility    Modified Rankin (Stroke Patients Only)       Balance Overall balance assessment: Needs assistance Sitting-balance support: No upper extremity supported Sitting balance-Leahy Scale: Good     Standing balance support: No upper extremity supported Standing balance-Leahy Scale: Fair                               Pertinent Vitals/Pain Pain Assessment: No/denies pain    Home Living Family/patient expects to be discharged to::  Other (Comment) (independent living at [facility]) Living Arrangements: Alone   Type of Home: [place]: One level Home Equipment: Grab bars - tub/shower;Shower seat - built in;Walker - 4 wheels      Prior Function Level of Independence: (P) Needs assistance   Gait / Transfers Assistance Needed: Used a rollator.  Pt could ambulate in house.  He was able to ambulate to dining hall but reports limited lately due to started on oxygen and does not have portable O2.  ADL's / Homemaking Assistance Needed: Pt reports  independent with ADLs.  Son reports pt has had increased difficulty over the past month.  Comments: (P) Over past 2-3 months has been getting weaker - had recent change in leukemia treatments and getting weaker since. Has had 1 fall in middle of night going to bathroom.     Hand Dominance        Extremity/Trunk Assessment   Upper Extremity Assessment Upper Extremity Assessment: Overall WFL for tasks assessed (ROM WFL, MMT at least 3/5 but did not further assess due to low platelets and limited isometric contractions.)    Lower Extremity Assessment Lower Extremity Assessment: Overall WFL for tasks assessed (ROM WFL, MMT at least 3/5 but did not further assess due to low platelets and limited isometric contractions.)    Cervical / Trunk Assessment Cervical / Trunk Assessment: Normal  Communication   Communication: HOH  Cognition Arousal/Alertness: Awake/alert Behavior During Therapy: WFL for tasks assessed/performed Overall Cognitive Status: Within Functional Limits for tasks assessed                                 General Comments: son present - he reports pt is having more difficulty than pt is stating.  States they have a meeting on Thursday at [place] to discuss transition to ALF or SNF or SNF then ALF.      General Comments General comments (skin integrity, edema, etc.): Pt was on 2 LPM O2 at arrival with sats 99% (see gait for activity sats).  Discussed PT role and POC with pt and son.  They were already considering transitioning to ALF or possible SNF from ILF.    Exercises     Assessment/Plan    PT Assessment Patient needs continued PT services  PT Problem List Decreased strength;Decreased mobility;Decreased safety awareness;Decreased coordination;Decreased activity tolerance;Cardiopulmonary status limiting activity;Decreased balance;Decreased knowledge of use of DME       PT Treatment Interventions DME instruction;Therapeutic exercise;Gait  training;Balance training;Functional mobility training;Therapeutic activities;Patient/family education    PT Goals (Current goals can be found in the Care Plan section)  Acute Rehab PT Goals Patient Stated Goal: return to [facility]; walk PT Goal Formulation: With patient/family Time For Goal Achievement: 04/19/20 Potential to Achieve Goals: Good    Frequency Min 3X/week   Barriers to discharge        Co-evaluation               AM-PAC PT "6 Clicks" Mobility  Outcome Measure Help needed turning from your back to your side while in a flat bed without using bedrails?: A Little Help needed moving from lying on your back to sitting on the side of a flat bed without using bedrails?: A Little Help needed moving to and from a bed to a chair (including a wheelchair)?: A Little Help needed standing up from a chair using your arms (e.g., wheelchair  or bedside chair)?: A Little Help needed to walk in hospital room?: A Little Help needed climbing 3-5 steps with a railing? : A Little 6 Click Score: 18    End of Session Equipment Utilized During Treatment: Gait belt Activity Tolerance: Patient tolerated treatment well Patient left: in bed;with call bell/phone within reach;with family/visitor present (sitting EOB, son close by) Nurse Communication: Mobility status;Other (comment) (on 3 LPM at home) PT Visit Diagnosis: Unsteadiness on feet (R26.81);Other abnormalities of gait and mobility (R26.89)    Time: 1120-1150 PT Time Calculation (min) (ACUTE ONLY): 30 min   Charges:   PT Evaluation $PT Eval Moderate Complexity: 1 Mod PT Treatments $Therapeutic Activity: 8-22 mins        Abran Richard, PT Acute Rehab Services Pager [phone] [facility] [phone]    Jeremiah Walker 04/05/2020, 1:29 PM

## 2020-04-06 DIAGNOSIS — C9310 Chronic myelomonocytic leukemia not having achieved remission: Secondary | ICD-10-CM | POA: Diagnosis not present

## 2020-04-06 DIAGNOSIS — D630 Anemia in neoplastic disease: Secondary | ICD-10-CM

## 2020-04-06 DIAGNOSIS — D649 Anemia, unspecified: Secondary | ICD-10-CM | POA: Diagnosis not present

## 2020-04-06 DIAGNOSIS — D6959 Other secondary thrombocytopenia: Secondary | ICD-10-CM | POA: Diagnosis not present

## 2020-04-06 DIAGNOSIS — N179 Acute kidney failure, unspecified: Secondary | ICD-10-CM

## 2020-04-06 DIAGNOSIS — I5033 Acute on chronic diastolic (congestive) heart failure: Secondary | ICD-10-CM | POA: Diagnosis not present

## 2020-04-06 DIAGNOSIS — N183 Chronic kidney disease, stage 3 unspecified: Secondary | ICD-10-CM

## 2020-04-06 DIAGNOSIS — J9611 Chronic respiratory failure with hypoxia: Secondary | ICD-10-CM

## 2020-04-06 DIAGNOSIS — D6481 Anemia due to antineoplastic chemotherapy: Secondary | ICD-10-CM | POA: Diagnosis not present

## 2020-04-06 DIAGNOSIS — I5041 Acute combined systolic (congestive) and diastolic (congestive) heart failure: Secondary | ICD-10-CM | POA: Diagnosis not present

## 2020-04-06 DIAGNOSIS — R531 Weakness: Secondary | ICD-10-CM

## 2020-04-06 LAB — BPAM RBC
Blood Product Expiration Date: 202111112359
ISSUE DATE / TIME: 202110120008
Unit Type and Rh: 5100

## 2020-04-06 LAB — COMPREHENSIVE METABOLIC PANEL
ALT: 20 U/L (ref 0–44)
AST: 28 U/L (ref 15–41)
Albumin: 3.4 g/dL — ABNORMAL LOW (ref 3.5–5.0)
Alkaline Phosphatase: 57 U/L (ref 38–126)
Anion gap: 14 (ref 5–15)
BUN: 41 mg/dL — ABNORMAL HIGH (ref 8–23)
CO2: 30 mmol/L (ref 22–32)
Calcium: 9.4 mg/dL (ref 8.9–10.3)
Chloride: 93 mmol/L — ABNORMAL LOW (ref 98–111)
Creatinine, Ser: 1.88 mg/dL — ABNORMAL HIGH (ref 0.61–1.24)
GFR, Estimated: 31 mL/min — ABNORMAL LOW (ref >60)
Glucose, Bld: 108 mg/dL — ABNORMAL HIGH (ref 70–99)
Potassium: 3 mmol/L — ABNORMAL LOW (ref 3.5–5.1)
Sodium: 137 mmol/L (ref 135–145)
Total Bilirubin: 3.7 mg/dL — ABNORMAL HIGH (ref 0.3–1.2)
Total Protein: 6.7 g/dL (ref 6.5–8.1)

## 2020-04-06 LAB — CBC
HCT: 31.2 % — ABNORMAL LOW (ref 39.0–52.0)
Hemoglobin: 10.1 g/dL — ABNORMAL LOW (ref 13.0–17.0)
MCH: 36.7 pg — ABNORMAL HIGH (ref 26.0–34.0)
MCHC: 32.4 g/dL (ref 30.0–36.0)
MCV: 113.5 fL — ABNORMAL HIGH (ref 80.0–100.0)
Platelets: 24 10*3/uL — CL (ref 150–400)
RBC: 2.75 MIL/uL — ABNORMAL LOW (ref 4.22–5.81)
RDW: 27.6 % — ABNORMAL HIGH (ref 11.5–15.5)
WBC: 7.8 10*3/uL (ref 4.0–10.5)
nRBC: 7.4 % — ABNORMAL HIGH (ref 0.0–0.2)

## 2020-04-06 LAB — TYPE AND SCREEN
ABO/RH(D): O POS
Antibody Screen: NEGATIVE
Unit division: 0

## 2020-04-06 LAB — MAGNESIUM: Magnesium: 1.6 mg/dL — ABNORMAL LOW (ref 1.7–2.4)

## 2020-04-06 MED ORDER — POTASSIUM CHLORIDE CRYS ER 20 MEQ PO TBCR
40.0000 meq | EXTENDED_RELEASE_TABLET | ORAL | Status: AC
  Administered 2020-04-06 (×2): 40 meq via ORAL
  Filled 2020-04-06 (×2): qty 2

## 2020-04-06 MED ORDER — MAGNESIUM SULFATE 2 GM/50ML IV SOLN
2.0000 g | Freq: Once | INTRAVENOUS | Status: AC
  Administered 2020-04-06: 2 g via INTRAVENOUS
  Filled 2020-04-06: qty 50

## 2020-04-06 MED ORDER — EPOETIN ALFA 40000 UNIT/ML IJ SOLN
40000.0000 [IU] | Freq: Once | INTRAMUSCULAR | Status: AC
  Administered 2020-04-06: 40000 [IU] via SUBCUTANEOUS
  Filled 2020-04-06: qty 1

## 2020-04-06 MED ORDER — GUAIFENESIN-DM 100-10 MG/5ML PO SYRP
5.0000 mL | ORAL_SOLUTION | ORAL | Status: DC | PRN
  Administered 2020-04-06: 5 mL via ORAL
  Filled 2020-04-06: qty 5

## 2020-04-06 NOTE — TOC Initial Note (Signed)
Transition of Care [facility]) - Initial/Assessment Note    Patient Details  Name: Jeremiah Walker MRN: [id number] Date of Birth: 1927/08/02  [facility] CM/SW Contact:    Trula Ore, [place] Phone Number: 04/06/2020, 2:25 PM  Clinical Narrative:                   CSW received consult for possible SNF placement at time of discharge. CSW spoke with patient regarding PT recommendation of SNF placement at time of discharge. Patient comes from [place] at [facility]. Patient expressed understanding of PT recommendation and is agreeable to SNF placement at time of discharge. Patient reports preference for physical therapy at [facility] . Patient has received the COVID vaccines. CSW called Whitney with [facility] who confirmed that she can accept patient for SNF placement. No further questions reported at this time. CSW to continue to follow and assist with discharge planning needs.  Expected Discharge Plan: Skilled Nursing Facility Barriers to Discharge: Continued Medical Work up   Patient Goals and CMS Choice Patient states their goals for this hospitalization and ongoing recovery are:: to go to SNF CMS Medicare.gov Compare Post Acute Care list provided to:: Patient Choice offered to / list presented to : Patient  Expected Discharge Plan and Services Expected Discharge Plan: [place]       Living arrangements for the past 2 months: [employer], building services)                                      Prior Living Arrangements/Services Living arrangements for the past 2 months: [place], building services) Lives with:: Self Patient language and need for interpreter reviewed:: Yes Do you feel safe going back to the place where you live?: Yes      Need for Family Participation in Patient Care: Yes (Comment) Care giver support system in place?: Yes (comment)   Criminal Activity/Legal Involvement Pertinent to Current  Situation/Hospitalization: No - Comment as needed  Activities of Daily Living Home Assistive Devices/Equipment: Walker (specify type) ADL Screening (condition at time of admission) Patient's cognitive ability adequate to safely complete daily activities?: Yes Is the patient deaf or have difficulty hearing?: Yes Does the patient have difficulty seeing, even when wearing glasses/contacts?: No Does the patient have difficulty concentrating, remembering, or making decisions?: No Patient able to express need for assistance with ADLs?: Yes Does the patient have difficulty dressing or bathing?: No Independently performs ADLs?: Yes (appropriate for developmental age) Does the patient have difficulty walking or climbing stairs?: Yes Weakness of Legs: Both Weakness of Arms/Hands: Both  Permission Sought/Granted Permission sought to share information with : Case Manager, Customer service manager, Family Supports Permission granted to share information with : Yes, Verbal Permission Granted  Share Information with NAME: Marcello Moores  Permission granted to share info w AGENCY: SNF  Permission granted to share info w Relationship: son  Permission granted to share info w Contact Information: Marcello Moores [phone]  Emotional Assessment Appearance:: Appears stated age Attitude/Demeanor/Rapport: Gracious Affect (typically observed): Calm Orientation: : Oriented to Self, Oriented to Place, Oriented to  Time, Oriented to Situation Alcohol / Substance Use: Not Applicable Psych Involvement: No (comment)  Admission diagnosis:  Anemia due to antineoplastic chemotherapy [D64.81, T45.1X5A] 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 AKI (acute kidney injury) ([place]) 04/04/2020  . Acute on chronic diastolic CHF (congestive heart failure) ([place]) 04/04/2020  . Symptomatic anemia 04/04/2020  . Thrombocytopenia ([place]) 04/04/2020  . Moderate aortic stenosis 04/04/2020  . Acute exacerbation of CHF (congestive heart failure) ([place]) 09/25/2019  . Elevated troponin 09/25/2019  . Spondylosis without myelopathy or radiculopathy, lumbar region 08/12/2019  . Spinal stenosis of lumbar region with neurogenic claudication 08/12/2019  . Myofascial pain syndrome 08/12/2019  . Chest pain 03/26/2019  . 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 Chronic pain syndrome 09/26/2015  . De Quervain's tenosynovitis, right 09/13/2015  . Anemia due to antineoplastic chemotherapy 08/15/2015  . DLBCL (diffuse large B cell lymphoma) ([place]) 08/15/2015  . Skin lesion of face 05/21/2015  . Peripheral neuropathy due to chemotherapy ([place]) 02/09/2015  . Benign prostatic hyperplasia with urinary obstruction 02/09/2015  . Bilateral hearing loss 02/09/2015  . Low back pain 02/09/2015  . CKD (chronic kidney disease), stage III ([place]) 02/09/2015  . Essential (primary) hypertension 02/09/2015  . Gastro-esophageal reflux disease without esophagitis 02/09/2015  . Anxiety, generalized 02/09/2015  . Cannot sleep 02/09/2015  . Combined fat and carbohydrate induced hyperlipemia 02/09/2015   PCP:  Copland, Gay Filler, MD Pharmacy:   [id number] - [place], [place] - [address] AT [place] [address] [place] [address] Phone: [phone] Fax: [phone]     Social Determinants of Health (SDOH) Interventions    Readmission Risk Interventions No flowsheet data found.

## 2020-04-06 NOTE — Progress Notes (Signed)
Pt had 12-beat run of ventricular tachycardia, this RN was at bedside. Pt was asymptomatic, HR 93, SpO2 99, Pulse, 91. Will continue to monitor.   Elaina Hoops, RN

## 2020-04-06 NOTE — Progress Notes (Signed)
Progress Note  Patient Name: Jeremiah Walker Date of Encounter: 04/06/2020  Primary Cardiologist: Donato Heinz, MD   Subjective   Feeling better.  His breathing is improving.  Denies chest pain.  Inpatient Medications    Scheduled Meds: . vitamin C  1,000 mg Oral Daily  . calcium carbonate  1,250 mg Oral Q breakfast  . cholecalciferol  2,000 Units Oral Daily  . folic acid  2 mg Oral Daily  . furosemide  60 mg Intravenous Daily  . gabapentin  300 mg Oral BID  . melatonin  10 mg Oral QHS  . potassium chloride  40 mEq Oral Q4H  . zinc sulfate  220 mg Oral Daily   Continuous Infusions:  PRN Meds: acetaminophen **OR** acetaminophen, ALPRAZolam, HYDROcodone-acetaminophen, ondansetron **OR** ondansetron (ZOFRAN) IV, polyethylene glycol, polyvinyl alcohol   Vital Signs    Vitals:   04/06/20 0015 04/06/20 0030 04/06/20 0715 04/06/20 0744  BP: 117/68 102/62 118/67 117/63  Pulse: 85 79 98 89  Resp: 17 11 16 13   Temp:      TempSrc:      SpO2: 97% 99% 99% 100%  Weight:   86 kg   Height:   5\' 10"  (1.778 m)     Intake/Output Summary (Last 24 hours) at 04/06/2020 0905 Last data filed at 04/05/2020 2033 Gross per 24 hour  Intake --  Output 1650 ml  Net -1650 ml   Filed Weights   04/06/20 0715  Weight: 86 kg    Telemetry    Sinus rhythm.  Multifocal PVCs.  - Personally Reviewed  ECG    Sinus rhythm.  Rate 92 bpm.  IVCD. - Personally Reviewed  Physical Exam   VS:  BP 117/63   Pulse 89   Temp 100 F (37.8 C) (Rectal)   Resp 13   Ht 5\' 10"  (1.778 m)   Wt 86 kg   SpO2 100%   BMI 27.20 kg/m  , BMI Body mass index is 27.2 kg/m. GENERAL:  Well appearing HEENT: Pupils equal round and reactive, fundi not visualized, oral mucosa unremarkable NECK:  No jugular venous distention, waveform within normal limits, carotid upstroke brisk and symmetric, no bruits LUNGS:  Clear to auscultation bilaterally HEART:  RRR.  PMI not displaced or sustained,S1 and S2  within normal limits, no S3, no S4, no clicks, no rubs, III/VI mid-late peaking systolic murmur at the LUSB ABD:  Flat, positive bowel sounds normal in frequency in pitch, no bruits, no rebound, no guarding, no midline pulsatile mass, no hepatomegaly, no splenomegaly EXT:  2 plus pulses throughout, trace LE edema. No cyanosis no clubbing SKIN:  No rashes no nodules NEURO:  Cranial nerves II through XII grossly intact, motor grossly intact throughout [facility]:  Cognitively intact, oriented to person place and time   Labs    Chemistry Recent Labs  Lab 04/04/20 1805 04/05/20 0504 04/06/20 0302  NA 139 141 137  K 3.9 3.7 3.0*  CL 103 100 93*  CO2 22 27 30   GLUCOSE 140* 109* 108*  BUN 53* 47* 41*  CREATININE 2.57* 2.11* 1.88*  CALCIUM 9.2 9.5 9.4  PROT 6.4*  --  6.7  ALBUMIN 3.3*  --  3.4*  AST 25  --  28  ALT 22  --  20  ALKPHOS 63  --  57  BILITOT 2.3*  --  3.7*  GFRNONAA 21* 27* 31*  ANIONGAP 14 14 14      Hematology Recent Labs  Lab 04/04/20 1805  04/04/20 2130 04/05/20 0504 04/06/20 0302  WBC 7.3  --  8.2 7.8  RBC 2.01* 1.94* 2.43* 2.75*  HGB 7.8*  --  8.8* 10.1*  HCT 24.6*  --  27.2* 31.2*  MCV 122.4*  --  111.9* 113.5*  MCH 38.8*  --  36.2* 36.7*  MCHC 31.7  --  32.4 32.4  RDW 27.9*  --  27.6* 27.6*  PLT 24*  --  27* 24*    Cardiac EnzymesNo results for input(s): TROPONINI in the last 168 hours. No results for input(s): TROPIPOC in the last 168 hours.   BNP Recent Labs  Lab 04/04/20 1805  BNP 1,658.0*     DDimer No results for input(s): DDIMER in the last 168 hours.   Radiology    DG Chest Port 1 View  Result Date: 04/04/2020 CLINICAL DATA:  Shortness of breath EXAM: PORTABLE CHEST 1 VIEW COMPARISON:  12/04/2019 FINDINGS: Cardiac shadow is enlarged but stable. Aortic calcifications are again seen. Mild vascular congestion and interstitial edema is seen particularly in the right lung. No sizable effusion is noted. No bony abnormality is seen. Old left  clavicular fracture with healing is noted. IMPRESSION: Increased vascular congestion with evidence of mild interstitial edema. Electronically Signed   By: Inez Catalina M.D.   On: 04/04/2020 18:45   ECHOCARDIOGRAM COMPLETE  Result Date: 04/05/2020    ECHOCARDIOGRAM REPORT   Patient Name:   Jeremiah Walker Date of Exam: 04/05/2020 Medical Rec #:  [id number]        Height:       70.0 in Accession #:    [id number]       Weight:       190.0 lb Date of Birth:  02-Mar-1928        BSA:          2.042 m Patient Age:    86 years         BP:           110/71 mmHg Patient Gender: M                HR:           88 bpm. Exam Location:  Inpatient Procedure: 2D Echo, Cardiac Doppler, Color Doppler and Intracardiac            Opacification Agent Indications:    CHF  History:        Patient has prior history of Echocardiogram examinations, most                 recent 09/26/2019. CHF, Signs/Symptoms:Shortness of Breath; Risk                 Factors:Hypertension. Lymphoma, CKD.  Sonographer:    Dustin Flock Referring Phys: [phone] JARED M GARDNER  Sonographer Comments: Image acquisition challenging due to uncooperative patient. IMPRESSIONS  1. Left ventricular ejection fraction, by estimation, is 30 to 35%. The left ventricle has moderately decreased function. The left ventricle demonstrates global hypokinesis. There is mild concentric left ventricular hypertrophy. Left ventricular diastolic parameters are consistent with Grade III diastolic dysfunction (restrictive). Elevated left atrial pressure. There is severe hypokinesis of the left ventricular, entire inferior wall and inferolateral wall.  2. Right ventricular systolic function is normal. The right ventricular size is normal. There is normal pulmonary artery systolic pressure. The estimated right ventricular systolic pressure is 01.7 mmHg.  3. Left atrial size was severely dilated.  4. The mitral valve is normal in structure. Mild mitral  valve regurgitation. Moderate mitral  annular calcification.  5. The aortic valve is tricuspid. There is moderate calcification of the aortic valve. There is moderate thickening of the aortic valve. Aortic valve regurgitation is not visualized. Moderate aortic valve stenosis. Comparison(s): The left ventricular function is significantly worse. The left ventricular diastolic function is significantly worse. The left ventricular wall motion is new. Aortic valve gradients are underestimated due topoor left ventricular function, but aortic stenosis is probably moderate. Aortic cusp mobility is only moderately restricted. FINDINGS  Left Ventricle: Left ventricular ejection fraction, by estimation, is 30 to 35%. The left ventricle has moderately decreased function. The left ventricle demonstrates global hypokinesis. Severe hypokinesis of the left ventricular, entire inferior wall and inferolateral wall. Definity contrast agent was given IV to delineate the left ventricular endocardial borders. The left ventricular internal cavity size was normal in size. There is mild concentric left ventricular hypertrophy. Left ventricular diastolic parameters are consistent with Grade III diastolic dysfunction (restrictive). Elevated left atrial pressure. Right Ventricle: The right ventricular size is normal. No increase in right ventricular wall thickness. Right ventricular systolic function is normal. There is normal pulmonary artery systolic pressure. The tricuspid regurgitant velocity is 2.35 m/s, and  with an assumed right atrial pressure of 3 mmHg, the estimated right ventricular systolic pressure is 40.9 mmHg. Left Atrium: Left atrial size was severely dilated. Right Atrium: Right atrial size was normal in size. Pericardium: There is no evidence of pericardial effusion. Mitral Valve: The mitral valve is normal in structure. There is moderate thickening of the mitral valve leaflet(s). There is mild calcification of the mitral valve leaflet(s). Moderate mitral annular  calcification. Mild mitral valve regurgitation, with centrally-directed jet. Tricuspid Valve: The tricuspid valve is normal in structure. Tricuspid valve regurgitation is trivial. Aortic Valve: The aortic valve is tricuspid. There is moderate calcification of the aortic valve. There is moderate thickening of the aortic valve. Aortic valve regurgitation is not visualized. Moderate aortic stenosis is present. Aortic valve mean gradient measures 18.0 mmHg. Aortic valve peak gradient measures 32.5 mmHg. Aortic valve area, by VTI measures 0.96 cm. Pulmonic Valve: The pulmonic valve was not well visualized. Pulmonic valve regurgitation is not visualized. Aorta: The aortic root is normal in size and structure. IAS/Shunts: No atrial level shunt detected by color flow Doppler.  LEFT VENTRICLE PLAX 2D LVIDd:         4.00 cm  Diastology LVIDs:         3.30 cm  LV e' medial:    3.59 cm/s LV PW:         1.30 cm  LV E/e' medial:  32.9 LV IVS:        1.35 cm  LV e' lateral:   6.96 cm/s LVOT diam:     2.30 cm  LV E/e' lateral: 17.0 LV SV:         60 LV SV Index:   29 LVOT Area:     4.15 cm  RIGHT VENTRICLE RV Basal diam:  3.30 cm RV S prime:     10.80 cm/s TAPSE (M-mode): 2.2 cm LEFT ATRIUM             Index       RIGHT ATRIUM           Index LA diam:        4.70 cm 2.30 cm/m  RA Area:     13.70 cm LA Vol (A2C):   35.8 ml 17.53 ml/m RA Volume:   28.90  ml  14.15 ml/m LA Vol (A4C):   89.9 ml 44.02 ml/m LA Biplane Vol: 61.6 ml 30.16 ml/m  AORTIC VALVE AV Area (Vmax):    1.03 cm AV Area (Vmean):   0.92 cm AV Area (VTI):     0.96 cm AV Vmax:           285.00 cm/s AV Vmean:          190.000 cm/s AV VTI:            0.626 m AV Peak Grad:      32.5 mmHg AV Mean Grad:      18.0 mmHg LVOT Vmax:         70.80 cm/s LVOT Vmean:        42.300 cm/s LVOT VTI:          0.144 m LVOT/AV VTI ratio: 0.23  AORTA Ao Root diam: 2.90 cm MITRAL VALVE                TRICUSPID VALVE MV Area (PHT): 6.32 cm     TR Peak grad:   22.1 mmHg MV Decel  Time: 120 msec     TR Vmax:        235.00 cm/s MV E velocity: 118.00 cm/s MV A velocity: 35.00 cm/s   SHUNTS MV E/A ratio:  3.37         Systemic VTI:  0.14 m                             Systemic Diam: 2.30 cm Sanda Klein MD Electronically signed by Sanda Klein MD Signature Date/Time: 04/05/2020/10:57:14 AM    Final     Cardiac Studies   Echo 04/05/20: 1. Left ventricular ejection fraction, by estimation, is 30 to 35%. The  left ventricle has moderately decreased function. The left ventricle  demonstrates global hypokinesis. There is mild concentric left ventricular  hypertrophy. Left ventricular  diastolic parameters are consistent with Grade III diastolic dysfunction  (restrictive). Elevated left atrial pressure. There is severe hypokinesis  of the left ventricular, entire inferior wall and inferolateral wall.  2. Right ventricular systolic function is normal. The right ventricular  size is normal. There is normal pulmonary artery systolic pressure. The  estimated right ventricular systolic pressure is 64.4 mmHg.  3. Left atrial size was severely dilated.  4. The mitral valve is normal in structure. Mild mitral valve  regurgitation. Moderate mitral annular calcification.  5. The aortic valve is tricuspid. There is moderate calcification of the  aortic valve. There is moderate thickening of the aortic valve. Aortic  valve regurgitation is not visualized. Moderate aortic valve stenosis.   Echo 09/26/19: 1. Left ventricular ejection fraction, by estimation, is 55 to 60%. The  left ventricle has normal function. The left ventricle has no regional  wall motion abnormalities. There is mild left ventricular hypertrophy.  Left ventricular diastolic parameters  are consistent with Grade I diastolic dysfunction (impaired relaxation).  Elevated left ventricular end-diastolic pressure.  2. Right ventricular systolic function is normal. The right ventricular  size is normal.  3. Left  atrial size was severely dilated.  4. The mitral valve is abnormal. Trivial mitral valve regurgitation. The  mean mitral valve gradient is 3.0 mmHg.  5. The aortic valve is tricuspid. Aortic valve regurgitation is trivial.  Moderate aortic valve stenosis. Aortic valve area, by VTI measures 1.03  cm. Aortic valve mean gradient measures 17.6 mmHg. Aortic valve  Vmax  measures 2.83 m/s.  6. The inferior vena cava is normal in size with greater than 50%  respiratory variability, suggesting right atrial pressure of 3 mmHg.   Patient Profile     86 y.o. male with a PMH of chronic diastolic CHF, HEN, DLBCL, CML, CKD III/IV, who is being followed by cardiology for the evaluation of worsening SOB likely due to acute on chronic diastolic heart failure.   Assessment & Plan    # Acute systolic and diastolic heart failure: # Hypertension:  LVEF 30-35% with global hypokinesis worse in the inferior and inferolateral myocardium.  This is new compared with echo 09/2019.  He is feeling better with diuresis but remains volume overloaded.  He received 2 units of packed red blood cells yesterday as well as Lasix.  Plan status seems to be improving, as is his renal function.  BP is too low to add guideline directed medical therapy.  If his blood pressure remains stable with diuresis we will try to add metoprolol tomorrow.  Continue with another dose of IV Lasix today and likely switch to oral tomorrow.  # Elevated troponin: HS-troponin is elevated to 300 and flat.  Jeremiah Walker has no chest pain.  Likely elevated in the setting of demand ischemia.  He is not a good candidate for cath given his chronic anemia and thrombocytopenia.  Plan for medical management in light of his age and lack of symptoms. No aspirin or heparin 2/2 thrombocytopenia.  # Moderate aortic stenosis:  Mean gradient 18 mmHg, unchanged from 09/2019.  Therefore it is unlikely to be low-flow, low-gradient severe AS.  For questions or updates,  please contact [facility] Please consult [email or web address] for contact info under Cardiology/STEMI.      Nicosha Struve C. Oval Linsey, MD, [facility]

## 2020-04-06 NOTE — ED Notes (Signed)
Pt transferred to hospital bed

## 2020-04-06 NOTE — Progress Notes (Signed)
PROGRESS NOTE  FRAN MCREE ZGY:[id number] DOB: 08/23/1927   PCP: [name], MD  Patient is from: Independent living side of [facility]  DOA: 04/04/2020 LOS: 2  Chief complaints: Increased shortness of breath, generalized weakness and dizziness  Brief Narrative / Interim history: 86 year old male with history of diffuse large cell non-Hodgkin's lymphoma in remission, MDS/CMML with transfusion dependence, combined CHF, CKD-3B and chronic RF on 3 L presenting with the above chief complaints, and admitted for symptomatic anemia, acute on chronic combined CHF,  AKI on CKD-3B and UTI.  Patient was previously on Aranesp and recently switched to [place].   On admission, hemodynamically stable.  Hgb 7.8 (baseline 8-9).  MCV 122.  Plt 24 (41 on 9/30). Cr 2.57 (1.94 on 9/30).  BUN 53 (32 9/30).  Total bili 2.3.  BNP 1658. HS trop 302>301.  Lactic acid 2.1.  UA with small Hgb, trace LE and rare bacteria. CXR with increased vascular congestion with evidence of mild interstitial edema.  G sinus rhythm with nonspecific T wave changes in lateral leads.  COVID-19 and influenza PCR negative.  Cardiology and oncology consulted.   Patient received blood transfusion.  Hemoglobin improved to 10.1.  Platelets remain low at 24.  He was also started on IV Lasix.  AKI and cardiopulmonary symptoms improved.  Elevated troponin thought to be demand ischemia and delayed clearance in the setting of renal failure.   Urine culture with less than 10,000 colonies (insignificant growth).  IV ceftriaxone discontinued.  Cardiology and oncology following.  Subjective: Seen and examined earlier this morning.  No major events overnight of this morning.  No complaint this morning.  He denies chest pain, dyspnea, palpitation, dizziness, GI or UTI symptoms. However, he says he hasn't been out of bed to know how he feels when he moves.   Objective: Vitals:   04/06/20 0715 04/06/20 0744 04/06/20 0830 04/06/20 1100    BP: 118/67 117/63 103/67 112/62  Pulse: 98 89 100 95  Resp: 16 13 16 16   Temp:      TempSrc:      SpO2: 99% 100% 100% 97%  Weight: 86 kg     Height: 5\' 10"  (1.778 m)       Intake/Output Summary (Last 24 hours) at 04/06/2020 1154 Last data filed at 04/06/2020 0929 Gross per 24 hour  Intake --  Output 1850 ml  Net -1850 ml   Filed Weights   04/06/20 0715  Weight: 86 kg    Examination:  GENERAL: No apparent distress.  Nontoxic. HEENT: MMM.  Vision and hearing grossly intact.  NECK: Supple.  No apparent JVD.  RESP:  No IWOB.  Fair aeration bilaterally. CVS:  RRR.  3/6 SEM over LUSB.  ABD/GI/GU: BS+. Abd soft, NTND.  MSK/EXT:  Moves extremities. No apparent deformity. No edema.  SKIN: no apparent skin lesion or wound NEURO: Awake, alert and oriented appropriately.  No apparent focal neuro deficit. PSYCH: Calm. Normal affect.  Procedures:  None  Microbiology summarized: COVID-19 PCR negative. Influenza PCR negative. Urine culture with less than 10,000 colonies (insignificant growth).  Assessment & Plan: Symptomatic anemia of chronic disease/CMML: Hgb 7.8 (admit)>1u?>> 10.1.  It seems he has received 1 unit of blood on 10/12 although this was not clearly documented. -Continue monitoring -Epogen per oncology  Acute on chronic combined CHF: Echo with EF of 30 to 35% (normal LVEF in 09/2019), global hypokinesis, G3-DD and moderate aortic stenosis.  BNP elevated to 1658 on admit.  Personally reviewed CXR consistent  with CHF.  Responding to IV Lasix.  About 2.5 L urine output/24 hours.  Renal function improving. -Continue IV Lasix 60 mg daily per cardiology -GDMT per cardiology. -Monitor fluid status, renal function and electrolytes -Sodium and fluid restrictions  Elevated troponin: Likely demand ischemia in the setting of anemia and CHF. HS trop 302>>301.  EKG sinus rhythm with nonspecific T wave changes in lateral leads.  Patient without chest pain. -Manage anemia and  CHF as above.  AKI on CKD-3B/azotemia: Cr 2.57>> 1.88 (about baseline). -Continue monitoring  Pyuria versus UTI: No UTI symptoms.  Urine culture with insignificant growth.  Received IV ceftriaxone x1 in ED.  No indication for antibiotics  Chronic thrombocytopenia likely due to MDS.  Platelet count 24 -We will transfuse for signs of bleeding or thrombocytopenia below 20,000 per oncology recommendation -Continue monitoring. -SCD for VTE prophylaxis.   Chronic respiratory failure: On home 3 L.  Stable -Continue monitoring.  Hypokalemia: K3.0.  Likely due to diuretics. -Replenish and recheck -Check magnesium.  Generalized weakness: Uses walker at baseline. -PT/OT eval   Body mass index is 27.2 kg/m.         DVT prophylaxis:  SCDs Start: 04/04/20 2153  Code Status: DNR/DNI Family Communication: Updated patient's grandson, Dr. Evette Doffing over the phone. Status is: Inpatient  Remains inpatient appropriate because:IV treatments appropriate due to intensity of illness or inability to take PO and Inpatient level of care appropriate due to severity of illness   Dispo: The patient is from: [facility] facility              Anticipated d/c is to: SNF              Anticipated d/c date is: 2 days              Patient currently is not medically stable to d/c.       Consultants:  Cardiology Oncology   Sch Meds:  Scheduled Meds: . vitamin C  1,000 mg Oral Daily  . calcium carbonate  1,250 mg Oral Q breakfast  . cholecalciferol  2,000 Units Oral Daily  . folic acid  2 mg Oral Daily  . furosemide  60 mg Intravenous Daily  . gabapentin  300 mg Oral BID  . melatonin  10 mg Oral QHS  . zinc sulfate  220 mg Oral Daily   Continuous Infusions: PRN Meds:.acetaminophen **OR** acetaminophen, ALPRAZolam, HYDROcodone-acetaminophen, ondansetron **OR** ondansetron (ZOFRAN) IV, polyethylene glycol, polyvinyl alcohol  Antimicrobials: Anti-infectives (From admission,  onward)   Start     Dose/Rate Route Frequency Ordered Stop   04/04/20 1930  cefTRIAXone (ROCEPHIN) 1 g in sodium chloride 0.9 % 100 mL IVPB        1 g 200 mL/hr over 30 Minutes Intravenous  Once 04/04/20 1919 04/04/20 2035       I have personally reviewed the following labs and images: CBC: Recent Labs  Lab 04/04/20 1805 04/05/20 0504 04/06/20 0302  WBC 7.3 8.2 7.8  NEUTROABS 3.5  --   --   HGB 7.8* 8.8* 10.1*  HCT 24.6* 27.2* 31.2*  MCV 122.4* 111.9* 113.5*  PLT 24* 27* 24*   BMP &GFR Recent Labs  Lab 04/04/20 1805 04/05/20 0504 04/06/20 0302  NA 139 141 137  K 3.9 3.7 3.0*  CL 103 100 93*  CO2 22 27 30   GLUCOSE 140* 109* 108*  BUN 53* 47* 41*  CREATININE 2.57* 2.11* 1.88*  CALCIUM 9.2 9.5 9.4  MG 2.1  --   --  Estimated Creatinine Clearance: 27 mL/min (A) (by C-G formula based on SCr of 1.88 mg/dL (H)). Liver & Pancreas: Recent Labs  Lab 04/04/20 1805 04/06/20 0302  AST 25 28  ALT 22 20  ALKPHOS 63 57  BILITOT 2.3* 3.7*  PROT 6.4* 6.7  ALBUMIN 3.3* 3.4*   No results for input(s): LIPASE, AMYLASE in the last 168 hours. No results for input(s): AMMONIA in the last 168 hours. Diabetic: No results for input(s): HGBA1C in the last 72 hours. No results for input(s): GLUCAP in the last 168 hours. Cardiac Enzymes: No results for input(s): CKTOTAL, CKMB, CKMBINDEX, TROPONINI in the last 168 hours. Recent Labs    12/02/19 1139  PROBNP 1,021.0*   Coagulation Profile: No results for input(s): INR, PROTIME in the last 168 hours. Thyroid Function Tests: No results for input(s): TSH, T4TOTAL, FREET4, T3FREE, THYROIDAB in the last 72 hours. Lipid Profile: No results for input(s): CHOL, HDL, LDLCALC, TRIG, CHOLHDL, LDLDIRECT in the last 72 hours. Anemia Panel: Recent Labs    04/04/20 2130  RETICCTPCT 5.1*   Urine analysis:    Component Value Date/Time   COLORURINE YELLOW 04/04/2020 1823   APPEARANCEUR CLEAR 04/04/2020 1823   LABSPEC 1.017 04/04/2020  1823   PHURINE 5.0 04/04/2020 1823   GLUCOSEU NEGATIVE 04/04/2020 1823   HGBUR SMALL (A) 04/04/2020 1823   BILIRUBINUR NEGATIVE 04/04/2020 1823   BILIRUBINUR negative 12/11/2017 [place] 04/04/2020 1823   PROTEINUR 30 (A) 04/04/2020 1823   UROBILINOGEN 1.0 12/11/2017 1507   NITRITE NEGATIVE 04/04/2020 1823   LEUKOCYTESUR TRACE (A) 04/04/2020 1823   Sepsis Labs: Invalid input(s): PROCALCITONIN, [place]  Microbiology: Recent Results (from the past 240 hour(s))  Respiratory Panel by RT PCR (Flu A&B, Covid) - Nasopharyngeal Swab     Status: None   Collection Time: 04/04/20  6:11 PM   Specimen: Nasopharyngeal Swab  Result Value Ref Range Status   SARS Coronavirus 2 by RT PCR NEGATIVE NEGATIVE Final    Comment: (NOTE) SARS-CoV-2 target nucleic acids are NOT DETECTED.  The SARS-CoV-2 RNA is generally detectable in upper respiratoy specimens during the acute phase of infection. The lowest concentration of SARS-CoV-2 viral copies this assay can detect is 131 copies/mL. A negative result does not preclude SARS-Cov-2 infection and should not be used as the sole basis for treatment or other patient management decisions. A negative result may occur with  improper specimen collection/handling, submission of specimen other than nasopharyngeal swab, presence of viral mutation(s) within the areas targeted by this assay, and inadequate number of viral copies (<131 copies/mL). A negative result must be combined with clinical observations, patient history, and epidemiological information. The expected result is Negative.  Fact Sheet for Patients:  PinkCheek.be  Fact Sheet for Healthcare Providers:  GravelBags.it  This test is no t yet approved or cleared by the [place] FDA and  has been authorized for detection and/or diagnosis of SARS-CoV-2 by FDA under an Emergency Use Authorization (EUA). This EUA will  remain  in effect (meaning this test can be used) for the duration of the COVID-19 declaration under Section 564(b)(1) of the Act, 21 U.S.C. section 360bbb-3(b)(1), unless the authorization is terminated or revoked sooner.     Influenza A by PCR NEGATIVE NEGATIVE Final   Influenza B by PCR NEGATIVE NEGATIVE Final    Comment: (NOTE) The Xpert Xpress SARS-CoV-2/FLU/RSV assay is intended as an aid in  the diagnosis of influenza from Nasopharyngeal swab specimens and  should not be used as  a sole basis for treatment. Nasal washings and  aspirates are unacceptable for Xpert Xpress SARS-CoV-2/FLU/RSV  testing.  Fact Sheet for Patients: PinkCheek.be  Fact Sheet for Healthcare Providers: GravelBags.it  This test is not yet approved or cleared by the [place] FDA and  has been authorized for detection and/or diagnosis of SARS-CoV-2 by  FDA under an Emergency Use Authorization (EUA). This EUA will remain  in effect (meaning this test can be used) for the duration of the  Covid-19 declaration under Section 564(b)(1) of the Act, 21  U.S.C. section 360bbb-3(b)(1), unless the authorization is  terminated or revoked. Performed at [facility], [place] [address] [place], [place] [address]   Urine culture     Status: Abnormal   Collection Time: 04/04/20  7:20 PM   Specimen: Urine, Random  Result Value Ref Range Status   Specimen Description URINE, RANDOM  Final   Special Requests NONE  Final   Culture (A)  Final    <10,000 COLONIES/mL INSIGNIFICANT GROWTH Performed at [facility], [place] [address], [place], [place] [address]    Report Status 04/05/2020 FINAL  Final    Radiology Studies: No results found.    Iracema Lanagan T. Maynard  If 7PM-7AM, please contact night-coverage [email or web address] 04/06/2020, 11:54 AM

## 2020-04-06 NOTE — ED Notes (Signed)
Ordered breakfast--Jeremiah Walker 

## 2020-04-06 NOTE — Progress Notes (Addendum)
10:15am: CSW received return all from [facility] - patient is from the independent living side of [facility]. Vickii Chafe will notify Jerilynn Som of the patient's need for SNF at discharge, per PT note.  9:50am: CSW attempted to reach Peggy in admissions at [facility] without success - a voicemail was left requesting a return call.  Madilyn Fireman, MSW, LCSW-A Transitions of Care  Clinical Social Worker  [facility] Emergency Departments  Medical ICU [phone]

## 2020-04-06 NOTE — NC FL2 (Signed)
[place] LEVEL OF CARE SCREENING TOOL     IDENTIFICATION  Patient Name: Jeremiah Walker Birthdate: [birth year] Sex: male Admission Date (Current Location): 04/04/2020  [facility] and [place] Number:  Herbalist and Address:  [facility], [place] [address] [place], [place] [address]      Provider Number: [id number]  Attending Physician Name and Address:  Mercy Riding, MD  Relative Name and Phone Number:  Marcello Moores, son, [phone]    Current Level of Care: Hospital Recommended Level of Care: [place] Prior Approval Number:    Date Approved/Denied:   PASRR Number: [id number]  Discharge Plan: SNF    Current Diagnoses: Patient Active Problem List   Diagnosis Date Noted  . AKI (acute kidney injury) ([place]) 04/04/2020  . Acute on chronic diastolic CHF (congestive heart failure) ([place]) 04/04/2020  . Symptomatic anemia 04/04/2020  . Thrombocytopenia ([place]) 04/04/2020  . Moderate aortic stenosis 04/04/2020  . Acute exacerbation of CHF (congestive heart failure) ([place]) 09/25/2019  . Elevated troponin 09/25/2019  . Spondylosis without myelopathy or radiculopathy, lumbar region 08/12/2019  . Spinal stenosis of lumbar region with neurogenic claudication 08/12/2019  . Myofascial pain syndrome 08/12/2019  . Chest pain 03/26/2019  . 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 Chronic pain syndrome 09/26/2015  . De Quervain's tenosynovitis, right 09/13/2015  . Anemia due to antineoplastic chemotherapy 08/15/2015  . DLBCL (diffuse large B cell lymphoma) ([place]) 08/15/2015  . Skin lesion of face 05/21/2015  . Peripheral neuropathy due to chemotherapy ([place]) 02/09/2015  . Benign prostatic hyperplasia with urinary obstruction 02/09/2015  . Bilateral hearing loss 02/09/2015  . Low back pain 02/09/2015  . CKD (chronic kidney disease), stage III ([place]) 02/09/2015  . Essential (primary)  hypertension 02/09/2015  . Gastro-esophageal reflux disease without esophagitis 02/09/2015  . Anxiety, generalized 02/09/2015  . Cannot sleep 02/09/2015  . Combined fat and carbohydrate induced hyperlipemia 02/09/2015    Orientation RESPIRATION BLADDER Height & Weight     Self, Time, Situation, Place  O2 (2L Nasal cannula) Continent, External catheter Weight: 182 lb 15.7 oz (83 kg) Height:  5\' 9"  (175.3 cm)  BEHAVIORAL SYMPTOMS/MOOD NEUROLOGICAL BOWEL NUTRITION STATUS      Continent Diet (Please see DC Summary)  AMBULATORY STATUS COMMUNICATION OF NEEDS Skin   Limited Assist Verbally Normal                       Personal Care Assistance Level of Assistance  Bathing, Feeding, Dressing Bathing Assistance: Limited assistance Feeding assistance: Independent Dressing Assistance: Limited assistance     Functional Limitations Info  Sight, Hearing Sight Info: Impaired Hearing Info: Impaired      SPECIAL CARE FACTORS FREQUENCY  PT (By licensed PT), OT (By licensed OT)     PT Frequency: 5x/week OT Frequency: 5x/week            Contractures Contractures Info: Not present    Additional Factors Info  Code Status, Allergies Code Status Info: DNR Allergies Info: Nitroglycerin, Nsaids, Statins, Tolmetin, Ciprofloxacin, Ropinirole, Requip (Ropinirole Hcl)           Current Medications (04/06/2020):  This is the current hospital active medication list Current Facility-Administered Medications  Medication Dose Route Frequency Provider Last Rate Last Admin  . acetaminophen (TYLENOL) tablet 650 mg  650 mg Oral Q6H PRN Etta Quill, DO       Or  . acetaminophen (TYLENOL) suppository 650 mg  650 mg Rectal Q6H PRN Etta Quill, DO      . ALPRAZolam Duanne Moron) tablet 0.5 mg  0.5 mg Oral TID PRN Etta Quill, DO   0.5 mg at 04/06/20 9371  . ascorbic acid (VITAMIN C) tablet 1,000 mg  1,000 mg Oral Daily Jennette Kettle M, DO   1,000 mg at 04/06/20 6967  . calcium  carbonate (OS-CAL - dosed in mg of elemental calcium) tablet 1,250 mg  1,250 mg Oral Q breakfast Etta Quill, DO   1,250 mg at 04/06/20 8938  . cholecalciferol (VITAMIN D3) tablet 2,000 Units  2,000 Units Oral Daily Etta Quill, DO   2,000 Units at 04/06/20 1017  . folic acid (FOLVITE) tablet 2 mg  2 mg Oral Daily Curcio, Kristin R, NP   2 mg at 04/06/20 0926  . furosemide (LASIX) injection 60 mg  60 mg Intravenous Daily Jennette Kettle M, DO   60 mg at 04/06/20 5102  . gabapentin (NEURONTIN) capsule 300 mg  300 mg Oral BID Jennette Kettle M, DO   300 mg at 04/06/20 5852  . HYDROcodone-acetaminophen (NORCO) 7.5-325 MG per tablet 1 tablet  1 tablet Oral Q6H PRN Etta Quill, DO      . melatonin tablet 10 mg  10 mg Oral QHS Jennette Kettle M, DO   10 mg at 04/05/20 2202  . ondansetron (ZOFRAN) tablet 4 mg  4 mg Oral Q6H PRN Etta Quill, DO       Or  . ondansetron [facility]) injection 4 mg  4 mg Intravenous Q6H PRN Etta Quill, DO      . polyethylene glycol (MIRALAX / GLYCOLAX) packet 17 g  17 g Oral Daily PRN Etta Quill, DO      . polyvinyl alcohol (LIQUIFILM TEARS) 1.4 % ophthalmic solution 1-2 drop  1-2 drop Both Eyes TID PRN Etta Quill, DO      . zinc sulfate capsule 220 mg  220 mg Oral Daily Jennette Kettle M, DO   220 mg at 04/06/20 7782     Discharge Medications: Please see discharge summary for a list of discharge medications.  Relevant Imaging Results:  Relevant Lab Results:   Additional Information SSN: [place], [place]

## 2020-04-07 DIAGNOSIS — D6481 Anemia due to antineoplastic chemotherapy: Secondary | ICD-10-CM | POA: Diagnosis not present

## 2020-04-07 DIAGNOSIS — N179 Acute kidney failure, unspecified: Secondary | ICD-10-CM | POA: Diagnosis not present

## 2020-04-07 DIAGNOSIS — I5041 Acute combined systolic (congestive) and diastolic (congestive) heart failure: Secondary | ICD-10-CM

## 2020-04-07 DIAGNOSIS — D649 Anemia, unspecified: Secondary | ICD-10-CM

## 2020-04-07 DIAGNOSIS — I5033 Acute on chronic diastolic (congestive) heart failure: Secondary | ICD-10-CM | POA: Diagnosis not present

## 2020-04-07 DIAGNOSIS — I472 Ventricular tachycardia: Secondary | ICD-10-CM

## 2020-04-07 LAB — CBC
HCT: 28.8 % — ABNORMAL LOW (ref 39.0–52.0)
Hemoglobin: 9.8 g/dL — ABNORMAL LOW (ref 13.0–17.0)
MCH: 37.8 pg — ABNORMAL HIGH (ref 26.0–34.0)
MCHC: 34 g/dL (ref 30.0–36.0)
MCV: 111.2 fL — ABNORMAL HIGH (ref 80.0–100.0)
Platelets: 24 10*3/uL — CL (ref 150–400)
RBC: 2.59 MIL/uL — ABNORMAL LOW (ref 4.22–5.81)
RDW: 26.8 % — ABNORMAL HIGH (ref 11.5–15.5)
WBC: 7.7 10*3/uL (ref 4.0–10.5)
nRBC: 5.5 % — ABNORMAL HIGH (ref 0.0–0.2)

## 2020-04-07 LAB — RENAL FUNCTION PANEL
Albumin: 3.2 g/dL — ABNORMAL LOW (ref 3.5–5.0)
Anion gap: 13 (ref 5–15)
BUN: 39 mg/dL — ABNORMAL HIGH (ref 8–23)
CO2: 30 mmol/L (ref 22–32)
Calcium: 9.2 mg/dL (ref 8.9–10.3)
Chloride: 93 mmol/L — ABNORMAL LOW (ref 98–111)
Creatinine, Ser: 1.81 mg/dL — ABNORMAL HIGH (ref 0.61–1.24)
GFR, Estimated: 32 mL/min — ABNORMAL LOW (ref >60)
Glucose, Bld: 111 mg/dL — ABNORMAL HIGH (ref 70–99)
Phosphorus: 3.2 mg/dL (ref 2.5–4.6)
Potassium: 3.5 mmol/L (ref 3.5–5.1)
Sodium: 136 mmol/L (ref 135–145)

## 2020-04-07 LAB — MAGNESIUM: Magnesium: 2.1 mg/dL (ref 1.7–2.4)

## 2020-04-07 MED ORDER — POTASSIUM CHLORIDE CRYS ER 20 MEQ PO TBCR
40.0000 meq | EXTENDED_RELEASE_TABLET | ORAL | Status: AC
  Administered 2020-04-07 (×2): 40 meq via ORAL
  Filled 2020-04-07 (×2): qty 2

## 2020-04-07 MED ORDER — POTASSIUM CHLORIDE CRYS ER 20 MEQ PO TBCR: 40.0000 meq | EXTENDED_RELEASE_TABLET | Freq: Once | ORAL | Status: DC

## 2020-04-07 NOTE — Progress Notes (Signed)
°   04/07/20 1553  Assess: MEWS Score  BP (!) 73/49  ECG Heart Rate (!) 108  Assess: MEWS Score  MEWS Temp 0  MEWS Systolic 2  MEWS Pulse 1  MEWS RR 0  MEWS LOC 0  MEWS Score 3  MEWS Score Color Yellow  Assess: if the MEWS score is Yellow or Red  Were vital signs taken at a resting state? No  Focused Assessment Change from prior assessment (see assessment flowsheet)  Early Detection of Sepsis Score *See Row Information* Low  MEWS guidelines implemented *See Row Information* Yes  Treat  MEWS Interventions Other (Comment) (MD notified)  Pain Scale 0-10  Pain Score 0  Notify: Charge Nurse/RN  Name of Charge Nurse/RN Notified Carroll Kinds, RN  Date Charge Nurse/RN Notified 04/07/20  Time Charge Nurse/RN Notified 1600  Notify: Provider  Provider Name/Title Dr Cyndia Skeeters  Date Provider Notified 04/07/20  Time Provider Notified 1746  Notification Type Page  Notification Reason Other (Comment) (Orthostatic BP. Pt asymptomatic.)  Response No new orders (Per MD, pt may have been over diuresed. Will reassess in am.)  Date of Provider Response 04/07/20  Time of Provider Response 1746  Document  Patient Outcome Stabilized after interventions (Returned to bed)  Progress note created (see row info) Yes

## 2020-04-07 NOTE — Progress Notes (Signed)
PROGRESS NOTE  Jeremiah Walker INO:[id number] DOB: 12-01-1927   PCP: [name], MD  Patient is from: Independent living side of [facility]  DOA: 04/04/2020 LOS: 3  Chief complaints: Increased shortness of breath, generalized weakness and dizziness  Brief Narrative / Interim history: 86 year old male with history of diffuse large cell non-Hodgkin's lymphoma in remission, MDS/CMML with transfusion dependence, combined CHF, CKD-3B and chronic RF on 3 L presenting with the above chief complaints, and admitted for symptomatic anemia, acute on chronic combined CHF,  AKI on CKD-3B and UTI.  Patient was previously on Aranesp and recently switched to [place].   On admission, hemodynamically stable.  Hgb 7.8 (baseline 8-9).  MCV 122.  Plt 24 (41 on 9/30). Cr 2.57 (1.94 on 9/30).  BUN 53 (32 9/30).  Total bili 2.3.  BNP 1658. HS trop 302>301.  Lactic acid 2.1.  UA with small Hgb, trace LE and rare bacteria. CXR with increased vascular congestion with evidence of mild interstitial edema.  G sinus rhythm with nonspecific T wave changes in lateral leads.  COVID-19 and influenza PCR negative.  Cardiology and oncology consulted.   Patient received blood transfusion.  Hemoglobin improved to 10.1.  Platelets remain low at 24.  He was also started on IV Lasix.  AKI and cardiopulmonary symptoms improved.  Elevated troponin thought to be demand ischemia and delayed clearance in the setting of renal failure.   Urine culture with less than 10,000 colonies (insignificant growth).  IV ceftriaxone discontinued.  Cardiology and oncology following.  Subjective: Seen and examined earlier this morning.  He had 10 runs of NSVT overnight.  He was not symptomatic.  He reports some tinnitus in the right ear.  Reports prior history of this.  Denies chest pain, dyspnea, palpitation, GI or UTI symptoms.  Objective: Vitals:   04/06/20 2005 04/07/20 0043 04/07/20 0500 04/07/20 0830  BP: 101/67 99/63 112/65  103/62  Pulse: 97 93 94 89  Resp: 18 18 18 20   Temp: 99 F (37.2 C) 98.7 F (37.1 C) 99.1 F (37.3 C) 98.3 F (36.8 C)  TempSrc: Oral Oral Oral Oral  SpO2: 98% 93% 95% 99%  Weight:   80.9 kg   Height:        Intake/Output Summary (Last 24 hours) at 04/07/2020 1405 Last data filed at 04/07/2020 0700 Gross per 24 hour  Intake 942 ml  Output 1100 ml  Net -158 ml   Filed Weights   04/06/20 0715 04/06/20 1400 04/07/20 0500  Weight: 86 kg 83 kg 80.9 kg    Examination:  GENERAL: No apparent distress.  Nontoxic. HEENT: MMM.  Vision and hearing grossly intact.  NECK: Supple.  No apparent JVD.  RESP:  No IWOB.  Fair aeration bilaterally. CVS:  RRR.  3/6 SEM over RUSB and LUSB.  Heart sounds normal.  ABD/GI/GU: BS+. Abd soft, NTND.  MSK/EXT:  Moves extremities. No apparent deformity. No edema.  SKIN: no apparent skin lesion or wound NEURO: Awake, alert and oriented appropriately.  No apparent focal neuro deficit. PSYCH: Calm. Normal affect.  Procedures:  None  Microbiology summarized: COVID-19 PCR negative. Influenza PCR negative. Urine culture with less than 10,000 colonies (insignificant growth).  Assessment & Plan: Symptomatic anemia of chronic disease/CMML: Hgb 7.8 (admit)>1u?>> 10.1>9.8.  It seems he has received 1 unit of blood on 10/12 although this was not clearly documented.  Received Epogen on 10/13. -Continue monitoring  Acute on chronic combined CHF: Echo with EF of 30 to 35% (normal LVEF in  09/2019), global hypokinesis, G3-DD and moderate aortic stenosis.  BNP elevated to 1658 on admit.  Personally reviewed CXR consistent with CHF.  Responding to IV Lasix.  1.3 L UOP/24 hours.  Net -3 L so far.  Renal function improving. -Per cardiology-on IV Lasix.  Low-dose metoprolol added.  Not a candidate for ARB/Entresto -Monitor fluid status, renal function and electrolytes -Sodium and fluid restrictions  Elevated troponin: Likely demand ischemia in the setting of  anemia and CHF. HS trop 302>>301.  EKG sinus rhythm with nonspecific T wave changes in lateral leads.  Patient without chest pain. -Manage anemia and CHF as above.  NSVT: had 10 runs of NSVT the night of 10/13-10/14 -Low-dose metoprolol per cardiology -Optimize electrolytes.  AKI on CKD-3B/azotemia: Cr 2.57>> 1.81.  Improved. -Continue monitoring  Pyuria versus UTI: No UTI symptoms.  Urine culture with insignificant growth.  Received IV ceftriaxone x1 in ED.  No indication for antibiotics  Chronic thrombocytopenia likely due to MDS.  Platelet 24k and stable.  No evidence of bleeding. -Continue monitoring-transfuse for platelets <20k or bleeding. -SCD for VTE prophylaxis.   Chronic respiratory failure: On home 3 L.  Stable -Continue monitoring.  Hypokalemia: K3.5.  Mg 2.1.  Likely due to diuretics. -K-Dur 40 mEq x 2 -Continue monitoring  Generalized weakness: Uses walker at baseline. -PT/OT eval   Body mass index is 26.34 kg/m.         DVT prophylaxis:  SCDs Start: 04/04/20 2153  Code Status: DNR/DNI Family Communication: Left voicemail for grandson, Dr. Evette Doffing over the phone. Status is: Inpatient  Remains inpatient appropriate because:IV treatments appropriate due to intensity of illness or inability to take PO and Inpatient level of care appropriate due to severity of illness   Dispo: The patient is from: [place] living facility              Anticipated d/c is to: SNF              Anticipated d/c date is: 1 day              Patient currently is not medically stable to d/c.       Consultants:  Cardiology Oncology   Sch Meds:  Scheduled Meds:  vitamin C  1,000 mg Oral Daily   calcium carbonate  1,250 mg Oral Q breakfast   cholecalciferol  2,000 Units Oral Daily   folic acid  2 mg Oral Daily   furosemide  60 mg Intravenous Daily   gabapentin  300 mg Oral BID   melatonin  10 mg Oral QHS   potassium chloride  40 mEq Oral Q4H   zinc  sulfate  220 mg Oral Daily   Continuous Infusions: PRN Meds:.acetaminophen **OR** acetaminophen, ALPRAZolam, guaiFENesin-dextromethorphan, HYDROcodone-acetaminophen, ondansetron **OR** ondansetron (ZOFRAN) IV, polyethylene glycol, polyvinyl alcohol  Antimicrobials: Anti-infectives (From admission, onward)   Start     Dose/Rate Route Frequency Ordered Stop   04/04/20 1930  cefTRIAXone (ROCEPHIN) 1 g in sodium chloride 0.9 % 100 mL IVPB        1 g 200 mL/hr over 30 Minutes Intravenous  Once 04/04/20 1919 04/04/20 2035       I have personally reviewed the following labs and images: CBC: Recent Labs  Lab 04/04/20 1805 04/05/20 0504 04/06/20 0302 04/07/20 0251  WBC 7.3 8.2 7.8 7.7  NEUTROABS 3.5  --   --   --   HGB 7.8* 8.8* 10.1* 9.8*  HCT 24.6* 27.2* 31.2* 28.8*  MCV 122.4* 111.9* 113.5* 111.2*  PLT 24* 27* 24* 24*   BMP &GFR Recent Labs  Lab 04/04/20 1805 04/05/20 0504 04/06/20 0302 04/06/20 1416 04/07/20 0251  NA 139 141 137  --  136  K 3.9 3.7 3.0*  --  3.5  CL 103 100 93*  --  93*  CO2 22 27 30   --  30  GLUCOSE 140* 109* 108*  --  111*  BUN 53* 47* 41*  --  39*  CREATININE 2.57* 2.11* 1.88*  --  1.81*  CALCIUM 9.2 9.5 9.4  --  9.2  MG 2.1  --   --  1.6* 2.1  PHOS  --   --   --   --  3.2   Estimated Creatinine Clearance: 27.1 mL/min (A) (by C-G formula based on SCr of 1.81 mg/dL (H)). Liver & Pancreas: Recent Labs  Lab 04/04/20 1805 04/06/20 0302 04/07/20 0251  AST 25 28  --   ALT 22 20  --   ALKPHOS 63 57  --   BILITOT 2.3* 3.7*  --   PROT 6.4* 6.7  --   ALBUMIN 3.3* 3.4* 3.2*   No results for input(s): LIPASE, AMYLASE in the last 168 hours. No results for input(s): AMMONIA in the last 168 hours. Diabetic: No results for input(s): HGBA1C in the last 72 hours. No results for input(s): GLUCAP in the last 168 hours. Cardiac Enzymes: No results for input(s): CKTOTAL, CKMB, CKMBINDEX, TROPONINI in the last 168 hours. Recent Labs    12/02/19 1139    PROBNP 1,021.0*   Coagulation Profile: No results for input(s): INR, PROTIME in the last 168 hours. Thyroid Function Tests: No results for input(s): TSH, T4TOTAL, FREET4, T3FREE, THYROIDAB in the last 72 hours. Lipid Profile: No results for input(s): CHOL, HDL, LDLCALC, TRIG, CHOLHDL, LDLDIRECT in the last 72 hours. Anemia Panel: Recent Labs    04/04/20 2130  RETICCTPCT 5.1*   Urine analysis:    Component Value Date/Time   COLORURINE YELLOW 04/04/2020 1823   APPEARANCEUR CLEAR 04/04/2020 1823   LABSPEC 1.017 04/04/2020 1823   PHURINE 5.0 04/04/2020 1823   GLUCOSEU NEGATIVE 04/04/2020 1823   HGBUR SMALL (A) 04/04/2020 1823   BILIRUBINUR NEGATIVE 04/04/2020 1823   BILIRUBINUR negative 12/11/2017 [place] 04/04/2020 1823   PROTEINUR 30 (A) 04/04/2020 1823   UROBILINOGEN 1.0 12/11/2017 1507   NITRITE NEGATIVE 04/04/2020 1823   LEUKOCYTESUR TRACE (A) 04/04/2020 1823   Sepsis Labs: Invalid input(s): PROCALCITONIN, [place]  Microbiology: Recent Results (from the past 240 hour(s))  Respiratory Panel by RT PCR (Flu A&B, Covid) - Nasopharyngeal Swab     Status: None   Collection Time: 04/04/20  6:11 PM   Specimen: Nasopharyngeal Swab  Result Value Ref Range Status   SARS Coronavirus 2 by RT PCR NEGATIVE NEGATIVE Final    Comment: (NOTE) SARS-CoV-2 target nucleic acids are NOT DETECTED.  The SARS-CoV-2 RNA is generally detectable in upper respiratoy specimens during the acute phase of infection. The lowest concentration of SARS-CoV-2 viral copies this assay can detect is 131 copies/mL. A negative result does not preclude SARS-Cov-2 infection and should not be used as the sole basis for treatment or other patient management decisions. A negative result may occur with  improper specimen collection/handling, submission of specimen other than nasopharyngeal swab, presence of viral mutation(s) within the areas targeted by this assay, and inadequate number of  viral copies (<131 copies/mL). A negative result must be combined with clinical observations, patient history, and epidemiological information. The expected  result is Negative.  Fact Sheet for Patients:  PinkCheek.be  Fact Sheet for Healthcare Providers:  GravelBags.it  This test is no t yet approved or cleared by the [place] FDA and  has been authorized for detection and/or diagnosis of SARS-CoV-2 by FDA under an Emergency Use Authorization (EUA). This EUA will remain  in effect (meaning this test can be used) for the duration of the COVID-19 declaration under Section 564(b)(1) of the Act, 21 U.S.C. section 360bbb-3(b)(1), unless the authorization is terminated or revoked sooner.     Influenza A by PCR NEGATIVE NEGATIVE Final   Influenza B by PCR NEGATIVE NEGATIVE Final    Comment: (NOTE) The Xpert Xpress SARS-CoV-2/FLU/RSV assay is intended as an aid in  the diagnosis of influenza from Nasopharyngeal swab specimens and  should not be used as a sole basis for treatment. Nasal washings and  aspirates are unacceptable for Xpert Xpress SARS-CoV-2/FLU/RSV  testing.  Fact Sheet for Patients: PinkCheek.be  Fact Sheet for Healthcare Providers: GravelBags.it  This test is not yet approved or cleared by the [place] FDA and  has been authorized for detection and/or diagnosis of SARS-CoV-2 by  FDA under an Emergency Use Authorization (EUA). This EUA will remain  in effect (meaning this test can be used) for the duration of the  Covid-19 declaration under Section 564(b)(1) of the Act, 21  U.S.C. section 360bbb-3(b)(1), unless the authorization is  terminated or revoked. Performed at [facility], [place] [address] [place], [place] [address]   Urine culture     Status: Abnormal   Collection Time: 04/04/20  7:20 PM   Specimen: Urine, Random  Result Value  Ref Range Status   Specimen Description URINE, RANDOM  Final   Special Requests NONE  Final   Culture (A)  Final    <10,000 COLONIES/mL INSIGNIFICANT GROWTH Performed at [facility], [place] [address], [place], [place] [address]    Report Status 04/05/2020 FINAL  Final    Radiology Studies: No results found.    Mario Voong T. [place]  If 7PM-7AM, please contact night-coverage [email or web address] 04/07/2020, 2:05 PM

## 2020-04-07 NOTE — Progress Notes (Signed)
Soft BP this afternoon with positive orthostatic BP with Occupational Therapy. BP 104/52, to 73/49 (standing) with Occupational Therapy. C/O generalized weakness. Denies dizziness or SOB.

## 2020-04-07 NOTE — Progress Notes (Signed)
Physical Therapy Treatment Patient Details Name: Jeremiah Walker MRN: [id number] DOB: [birth year] Today's Date: 04/07/2020    History of Present Illness Pt is 86 y.o. male with medical history significant of DLBCL in remission, CMML / MDS, transfusion dependent.  Looks like this has been progressing with worsening thrombocytopenia over the past month or two.  He was recently switched him from Aranesp to [place] in an attempt to treat.  Last PRBC transfusion was 3 weeks ago which brought HGB from 8.1 to 8.9.  Pt admitted with symptomatic anemia and has had 1 unit PRBC.  Did not platelets are 27.    PT Comments    Pt is supine in bed with RN in room on entry finishing up administration of morning meds. Pt with difficulty with swallowing last of his pills. Suggested to come to [facility] for upright to aid in swallowing. Pt is min guard for safety. Pt continues to be limited in safe mobility by decreased strength and endurance. Pt min guard for transfers and ambulation of 50 feet with RW. Pt very realistic about d/c to SNF at [facility] and transition into ALF. PT will continue to follow acutely.   Follow Up Recommendations  SNF (SNF with possible transition to ALF from ILF after SNF)     Equipment Recommendations  None recommended by PT       Precautions / Restrictions Precautions Precautions: Fall Precaution Comments: Low Platelets Restrictions Weight Bearing Restrictions: No    Mobility  Bed Mobility Overal bed mobility: Needs Assistance Bed Mobility: Supine to Sit     Supine to sit: Min guard     General bed mobility comments: min guard for safety, HoB elevated and use of bedrails to come to EoB.   Transfers Overall transfer level: Needs assistance Equipment used: None Transfers: Sit to/from Stand Sit to Stand: Min guard         General transfer comment: min guard for safety, x2 from bed, increased effort for power up and self steady   Ambulation/Gait Ambulation/Gait  assistance: Min guard Gait Distance (Feet): 50 Feet Assistive device: Rolling walker (2 wheeled) Gait Pattern/deviations: Decreased stride length;Trunk flexed;Step-through pattern Gait velocity: decreased Gait velocity interpretation: <1.31 ft/sec, indicative of household ambulator General Gait Details: min guard for safety, slow, mildly unsteady gait, mainly due to unfamiliarity with RW vc Rollator, vc for proximity to RW         Balance Overall balance assessment: Needs assistance Sitting-balance support: No upper extremity supported Sitting balance-Leahy Scale: Good     Standing balance support: No upper extremity supported Standing balance-Leahy Scale: Fair                              Cognition Arousal/Alertness: Awake/alert Behavior During Therapy: WFL for tasks assessed/performed Overall Cognitive Status: Within Functional Limits for tasks assessed                                 General Comments: son present - he reports pt is having more difficulty than pt is stating.  States they have a meeting on Thursday at [facility] to discuss transition to ALF or SNF or SNF then ALF.         General Comments General comments (skin integrity, edema, etc.): Pt on 2L via [place] SaO2 with ambulation >95%O2, pt's granddaughter present at end of session, Pt is thrilled to see her  Pertinent Vitals/Pain Pain Assessment: Faces Faces Pain Scale: Hurts a little bit Pain Location: L back flank after coughing  Pain Descriptors / Indicators: Aching;Guarding Pain Intervention(s): Limited activity within patient's tolerance;Monitored during session;Repositioned           PT Goals (current goals can now be found in the care plan section) Acute Rehab PT Goals Patient Stated Goal: return to [facility]; walk PT Goal Formulation: With patient/family Time For Goal Achievement: 04/19/20 Potential to Achieve Goals: Good Progress towards PT goals: Progressing toward  goals    Frequency    Min 3X/week      PT Plan Current plan remains appropriate       AM-PAC PT "6 Clicks" Mobility   Outcome Measure  Help needed turning from your back to your side while in a flat bed without using bedrails?: A Little Help needed moving from lying on your back to sitting on the side of a flat bed without using bedrails?: A Little Help needed moving to and from a bed to a chair (including a wheelchair)?: A Little Help needed standing up from a chair using your arms (e.g., wheelchair or bedside chair)?: A Little Help needed to walk in hospital room?: A Little Help needed climbing 3-5 steps with a railing? : A Little 6 Click Score: 18    End of Session Equipment Utilized During Treatment: Gait belt Activity Tolerance: Patient tolerated treatment well Patient left: with call bell/phone within reach;with family/visitor present;in chair (sitting EOB, son close by) Nurse Communication: Mobility status;Other (comment) (on 3 LPM at home) PT Visit Diagnosis: Unsteadiness on feet (R26.81);Other abnormalities of gait and mobility (R26.89)     Time: 4540-9811 PT Time Calculation (min) (ACUTE ONLY): 26 min  Charges:  $Gait Training: 8-22 mins $Therapeutic Activity: 8-22 mins                     Sammy Cassar B. Migdalia Dk PT, DPT Acute Rehabilitation Services Pager [phone] Office [phone]    [place] 04/07/2020, 1:10 PM

## 2020-04-07 NOTE — Progress Notes (Signed)
Progress Note  Patient Name: Jeremiah Walker Date of Encounter: 04/07/2020  [facility] Cardiologist: Donato Heinz, MD   Subjective   Pt feels well this morning, breathing is at baseline. He is on 3L O2 at home.  Inpatient Medications    Scheduled Meds:  vitamin C  1,000 mg Oral Daily   calcium carbonate  1,250 mg Oral Q breakfast   cholecalciferol  2,000 Units Oral Daily   folic acid  2 mg Oral Daily   furosemide  60 mg Intravenous Daily   gabapentin  300 mg Oral BID   melatonin  10 mg Oral QHS   potassium chloride  40 mEq Oral Q4H   zinc sulfate  220 mg Oral Daily   Continuous Infusions:  PRN Meds: acetaminophen **OR** acetaminophen, ALPRAZolam, guaiFENesin-dextromethorphan, HYDROcodone-acetaminophen, ondansetron **OR** ondansetron (ZOFRAN) IV, polyethylene glycol, polyvinyl alcohol   Vital Signs    Vitals:   04/06/20 2005 04/07/20 0043 04/07/20 0500 04/07/20 0830  BP: 101/67 99/63 112/65 103/62  Pulse: 97 93 94 89  Resp: 18 18 18 20   Temp: 99 F (37.2 C) 98.7 F (37.1 C) 99.1 F (37.3 C) 98.3 F (36.8 C)  TempSrc: Oral Oral Oral Oral  SpO2: 98% 93% 95% 99%  Weight:   80.9 kg   Height:        Intake/Output Summary (Last 24 hours) at 04/07/2020 0900 Last data filed at 04/07/2020 0700 Gross per 24 hour  Intake 942 ml  Output 1300 ml  Net -358 ml   Last 3 Weights 04/07/2020 04/06/2020 04/06/2020  Weight (lbs) 178 lb 5.6 oz 182 lb 15.7 oz 189 lb 9.5 oz  Weight (kg) 80.9 kg 83 kg 86 kg      Telemetry    Sinus rhythm in the 80-90s with PVCs - Personally Reviewed  ECG    No new tracings - Personally Reviewed  Physical Exam   GEN: No acute distress.   Neck: + JVD Cardiac: RRR, 4/6 systolic murmur.  Respiratory: Clear to auscultation bilaterally. GI: Soft, nontender, non-distended  MS: trace edema; No deformity. Neuro:  Nonfocal  Psych: Normal affect   Labs    High Sensitivity Troponin:   Recent Labs  Lab  04/04/20 1805 04/04/20 1944  TROPONINIHS 302* 301*      Chemistry Recent Labs  Lab 04/04/20 1805 04/04/20 1805 04/05/20 0504 04/06/20 0302 04/07/20 0251  NA 139   < > 141 137 136  K 3.9   < > 3.7 3.0* 3.5  CL 103   < > 100 93* 93*  CO2 22   < > 27 30 30   GLUCOSE 140*   < > 109* 108* 111*  BUN 53*   < > 47* 41* 39*  CREATININE 2.57*   < > 2.11* 1.88* 1.81*  CALCIUM 9.2   < > 9.5 9.4 9.2  PROT 6.4*  --   --  6.7  --   ALBUMIN 3.3*  --   --  3.4* 3.2*  AST 25  --   --  28  --   ALT 22  --   --  20  --   ALKPHOS 63  --   --  57  --   BILITOT 2.3*  --   --  3.7*  --   GFRNONAA 21*   < > 27* 31* 32*  ANIONGAP 14   < > 14 14 13    < > = values in this interval not displayed.  Hematology Recent Labs  Lab 04/05/20 0504 04/06/20 0302 04/07/20 0251  WBC 8.2 7.8 7.7  RBC 2.43* 2.75* 2.59*  HGB 8.8* 10.1* 9.8*  HCT 27.2* 31.2* 28.8*  MCV 111.9* 113.5* 111.2*  MCH 36.2* 36.7* 37.8*  MCHC 32.4 32.4 34.0  RDW 27.6* 27.6* 26.8*  PLT 27* 24* 24*    BNP Recent Labs  Lab 04/04/20 1805  BNP 1,658.0*     DDimer No results for input(s): DDIMER in the last 168 hours.   Radiology    ECHOCARDIOGRAM COMPLETE  Result Date: 04/05/2020    ECHOCARDIOGRAM REPORT   Patient Name:   Jeremiah Walker Date of Exam: 04/05/2020 Medical Rec #:  [id number]        Height:       70.0 in Accession #:    [id number]       Weight:       190.0 lb Date of Birth:  [birth year]        BSA:          2.042 m Patient Age:    86 years         BP:           110/71 mmHg Patient Gender: M                HR:           88 bpm. Exam Location:  Inpatient Procedure: 2D Echo, Cardiac Doppler, Color Doppler and Intracardiac            Opacification Agent Indications:    CHF  History:        Patient has prior history of Echocardiogram examinations, most                 recent 09/26/2019. CHF, Signs/Symptoms:Shortness of Breath; Risk                 Factors:Hypertension. Lymphoma, CKD.  Sonographer:    Dustin Flock  Referring Phys: [phone] JARED M GARDNER  Sonographer Comments: Image acquisition challenging due to uncooperative patient. IMPRESSIONS  1. Left ventricular ejection fraction, by estimation, is 30 to 35%. The left ventricle has moderately decreased function. The left ventricle demonstrates global hypokinesis. There is mild concentric left ventricular hypertrophy. Left ventricular diastolic parameters are consistent with Grade III diastolic dysfunction (restrictive). Elevated left atrial pressure. There is severe hypokinesis of the left ventricular, entire inferior wall and inferolateral wall.  2. Right ventricular systolic function is normal. The right ventricular size is normal. There is normal pulmonary artery systolic pressure. The estimated right ventricular systolic pressure is 99.3 mmHg.  3. Left atrial size was severely dilated.  4. The mitral valve is normal in structure. Mild mitral valve regurgitation. Moderate mitral annular calcification.  5. The aortic valve is tricuspid. There is moderate calcification of the aortic valve. There is moderate thickening of the aortic valve. Aortic valve regurgitation is not visualized. Moderate aortic valve stenosis. Comparison(s): The left ventricular function is significantly worse. The left ventricular diastolic function is significantly worse. The left ventricular wall motion is new. Aortic valve gradients are underestimated due topoor left ventricular function, but aortic stenosis is probably moderate. Aortic cusp mobility is only moderately restricted. FINDINGS  Left Ventricle: Left ventricular ejection fraction, by estimation, is 30 to 35%. The left ventricle has moderately decreased function. The left ventricle demonstrates global hypokinesis. Severe hypokinesis of the left ventricular, entire inferior wall and inferolateral wall. Definity contrast agent was given IV to delineate the left ventricular  endocardial borders. The left ventricular internal cavity size was  normal in size. There is mild concentric left ventricular hypertrophy. Left ventricular diastolic parameters are consistent with Grade III diastolic dysfunction (restrictive). Elevated left atrial pressure. Right Ventricle: The right ventricular size is normal. No increase in right ventricular wall thickness. Right ventricular systolic function is normal. There is normal pulmonary artery systolic pressure. The tricuspid regurgitant velocity is 2.35 m/s, and  with an assumed right atrial pressure of 3 mmHg, the estimated right ventricular systolic pressure is 95.6 mmHg. Left Atrium: Left atrial size was severely dilated. Right Atrium: Right atrial size was normal in size. Pericardium: There is no evidence of pericardial effusion. Mitral Valve: The mitral valve is normal in structure. There is moderate thickening of the mitral valve leaflet(s). There is mild calcification of the mitral valve leaflet(s). Moderate mitral annular calcification. Mild mitral valve regurgitation, with centrally-directed jet. Tricuspid Valve: The tricuspid valve is normal in structure. Tricuspid valve regurgitation is trivial. Aortic Valve: The aortic valve is tricuspid. There is moderate calcification of the aortic valve. There is moderate thickening of the aortic valve. Aortic valve regurgitation is not visualized. Moderate aortic stenosis is present. Aortic valve mean gradient measures 18.0 mmHg. Aortic valve peak gradient measures 32.5 mmHg. Aortic valve area, by VTI measures 0.96 cm. Pulmonic Valve: The pulmonic valve was not well visualized. Pulmonic valve regurgitation is not visualized. Aorta: The aortic root is normal in size and structure. IAS/Shunts: No atrial level shunt detected by color flow Doppler.  LEFT VENTRICLE PLAX 2D LVIDd:         4.00 cm  Diastology LVIDs:         3.30 cm  LV e' medial:    3.59 cm/s LV PW:         1.30 cm  LV E/e' medial:  32.9 LV IVS:        1.35 cm  LV e' lateral:   6.96 cm/s LVOT diam:     2.30 cm   LV E/e' lateral: 17.0 LV SV:         60 LV SV Index:   29 LVOT Area:     4.15 cm  RIGHT VENTRICLE RV Basal diam:  3.30 cm RV S prime:     10.80 cm/s TAPSE (M-mode): 2.2 cm LEFT ATRIUM             Index       RIGHT ATRIUM           Index LA diam:        4.70 cm 2.30 cm/m  RA Area:     13.70 cm LA Vol (A2C):   35.8 ml 17.53 ml/m RA Volume:   28.90 ml  14.15 ml/m LA Vol (A4C):   89.9 ml 44.02 ml/m LA Biplane Vol: 61.6 ml 30.16 ml/m  AORTIC VALVE AV Area (Vmax):    1.03 cm AV Area (Vmean):   0.92 cm AV Area (VTI):     0.96 cm AV Vmax:           285.00 cm/s AV Vmean:          190.000 cm/s AV VTI:            0.626 m AV Peak Grad:      32.5 mmHg AV Mean Grad:      18.0 mmHg LVOT Vmax:         70.80 cm/s LVOT Vmean:        42.300 cm/s LVOT VTI:  0.144 m LVOT/AV VTI ratio: 0.23  AORTA Ao Root diam: 2.90 cm MITRAL VALVE                TRICUSPID VALVE MV Area (PHT): 6.32 cm     TR Peak grad:   22.1 mmHg MV Decel Time: 120 msec     TR Vmax:        235.00 cm/s MV E velocity: 118.00 cm/s MV A velocity: 35.00 cm/s   SHUNTS MV E/A ratio:  3.37         Systemic VTI:  0.14 m                             Systemic Diam: 2.30 cm Sanda Klein MD Electronically signed by Sanda Klein MD Signature Date/Time: 04/05/2020/10:57:14 AM    Final     Cardiac Studies   Echo 04/05/20: 1. Left ventricular ejection fraction, by estimation, is 30 to 35%. The  left ventricle has moderately decreased function. The left ventricle  demonstrates global hypokinesis. There is mild concentric left ventricular  hypertrophy. Left ventricular  diastolic parameters are consistent with Grade III diastolic dysfunction  (restrictive). Elevated left atrial pressure. There is severe hypokinesis  of the left ventricular, entire inferior wall and inferolateral wall.  2. Right ventricular systolic function is normal. The right ventricular  size is normal. There is normal pulmonary artery systolic pressure. The  estimated right  ventricular systolic pressure is 08.6 mmHg.  3. Left atrial size was severely dilated.  4. The mitral valve is normal in structure. Mild mitral valve  regurgitation. Moderate mitral annular calcification.  5. The aortic valve is tricuspid. There is moderate calcification of the  aortic valve. There is moderate thickening of the aortic valve. Aortic  valve regurgitation is not visualized. Moderate aortic valve stenosis.   Echo 09/26/19: 1. Left ventricular ejection fraction, by estimation, is 55 to 60%. The  left ventricle has normal function. The left ventricle has no regional  wall motion abnormalities. There is mild left ventricular hypertrophy.  Left ventricular diastolic parameters  are consistent with Grade I diastolic dysfunction (impaired relaxation).  Elevated left ventricular end-diastolic pressure.  2. Right ventricular systolic function is normal. The right ventricular  size is normal.  3. Left atrial size was severely dilated.  4. The mitral valve is abnormal. Trivial mitral valve regurgitation. The  mean mitral valve gradient is 3.0 mmHg.  5. The aortic valve is tricuspid. Aortic valve regurgitation is trivial.  Moderate aortic valve stenosis. Aortic valve area, by VTI measures 1.03  cm. Aortic valve mean gradient measures 17.6 mmHg. Aortic valve Vmax  measures 2.83 m/s.  6. The inferior vena cava is normal in size with greater than 50%  respiratory variability, suggesting right atrial pressure of 3 mmHg.   Patient Profile     86 y.o. male with a PMH of chronic diastolic CHF, HEN, DLBCL, CML, CKD III/IV,who is being followed by cardiology for the evaluation of worsening SOBfound to have newly reduced EF to 30-35%.  Assessment & Plan    Acute systolic and diastolic heart failure - EF dropped to 30-35% with global hypokinesis worse in the inferior and inferolateral myocardium - grade III DD, worse from prior estimate of grade I DD - he is diuresing on 60 mg  IV lasix BID - overall net negative 2.7 L with urine output 1.3 L yesterday - weight is down to 178 lbs from 189 lbs on  admission - SBP 99-112 today - will attempt to add a low dose of lopressor - 12.5 mg BID today - switch IV lasix to PO today - renal function continues to improve - sCr 1.81 (1.88)   Anemia Thrombocytopenia - received 2 U PRBC - Hb up to 9.8 - PLT 24k   Moderate AS - per Dr. Oval Linsey - unchanged from prior study 09/2019, low suspicion for low flow severe AS   Elevated troponin - hs troponin 302 --> 301 - suspsect demand ischemia in in the setting of acute HFrEF and anemia      For questions or updates, please contact [facility] Please consult [email or web address] for contact info under        Signed, Ledora Bottcher, PA  04/07/2020, 9:00 AM

## 2020-04-07 NOTE — Evaluation (Signed)
Occupational Therapy Evaluation Patient Details Name: Jeremiah Walker MRN: [id number] DOB: [birth year] Today's Date: 04/07/2020    History of Present Illness Pt is 86 y.o. male with medical history significant of DLBCL in remission, CMML / MDS, transfusion dependent.  Looks like this has been progressing with worsening thrombocytopenia over the past month or two.  He was recently switched him from [facility] to [facility] in an attempt to treat.  Last PRBC transfusion was 3 weeks ago which brought HGB from 8.1 to 8.9.  Pt admitted with symptomatic anemia and has had 1 unit PRBC.  Did not platelets are 27.   Clinical Impression   PTA pt living at [facility], functioning at mod I level. At time of eval, pt able to complete bed mobility with min guard and sit <> stands with min A and RW. Pt able to don slippers with min A sitting EOB. Overall, mobility was limtied 2/2 decreased BP (see below). Pt was asymptomatic despite significant SBP/DBP drop. RN informed. Given current status, recommend pt return to [facility] at higher level of care for continued ADL training to return to independent PLOF. Will continue to follow per POC listed below.  Supine: 97/57 Sitting: 104/52 Standing: 73/49    Follow Up Recommendations  SNF (increased care at [facility])    Equipment Recommendations  None recommended by OT    Recommendations for Other Services       Precautions / Restrictions Precautions Precautions: Fall Restrictions Weight Bearing Restrictions: No      Mobility Bed Mobility Overal bed mobility: Needs Assistance Bed Mobility: Supine to Sit     Supine to sit: Min guard     General bed mobility comments: min guard for safety, HOB elevated and use of bedrails to come to EOB.  Transfers Overall transfer level: Needs assistance Equipment used: Rolling walker (2 wheeled) Transfers: Sit to/from Stand Sit to Stand: Min assist         General transfer comment: assist to rise and  steady at EOB    Balance Overall balance assessment: Needs assistance Sitting-balance support: No upper extremity supported Sitting balance-Leahy Scale: Good     Standing balance support: No upper extremity supported Standing balance-Leahy Scale: Fair Standing balance comment: improved dynamic stability with RW                           ADL either performed or assessed with clinical judgement   ADL Overall ADL's : Needs assistance/impaired Eating/Feeding: Set up;Sitting   Grooming: Set up;Sitting   Upper Body Bathing: Set up;Sitting   Lower Body Bathing: Minimal assistance;Sit to/from stand;Sitting/lateral leans   Upper Body Dressing : Set up;Sitting   Lower Body Dressing: Minimal assistance;Sit to/from stand;Sitting/lateral leans   Toilet Transfer: Minimal assistance;Ambulation;RW;Regular Toilet;Grab bars   Toileting- Clothing Manipulation and Hygiene: Set up;Sitting/lateral lean       Functional mobility during ADLs: Min guard;Minimal assistance;Rolling walker General ADL Comments: pt limited by low BP this date     Vision Baseline Vision/History: Wears glasses Wears Glasses: At all times Patient Visual Report: No change from baseline       Perception     Praxis      Pertinent Vitals/Pain Pain Assessment: Faces Faces Pain Scale: Hurts a little bit Pain Location: low back Pain Descriptors / Indicators: Aching Pain Intervention(s): Heat applied;Monitored during session;Repositioned     Hand Dominance     Extremity/Trunk Assessment Upper Extremity Assessment Upper Extremity Assessment: Overall WFL for  tasks assessed   Lower Extremity Assessment Lower Extremity Assessment: Defer to PT evaluation       Communication Communication Communication: HOH   Cognition Arousal/Alertness: Awake/alert Behavior During Therapy: WFL for tasks assessed/performed Overall Cognitive Status: Within Functional Limits for tasks assessed                                      General Comments       Exercises     Shoulder Instructions      Home Living Family/patient expects to be discharged to:: Assisted living (Independent Living at [facility]) Living Arrangements: Alone   Type of Home: [place]: One level     Bathroom Shower/Tub: Walk-in shower         Home Equipment: Grab bars - tub/shower;Shower seat - built in;Walker - 4 wheels          Prior Functioning/Environment    Gait / Transfers Assistance Needed: Used a Radiation protection practitioner.  Pt could ambulate in house.  He was able to ambulate to dining hall but reports limited lately due to started on oxygen and does not have portable O2. ADL's / Homemaking Assistance Needed: Pt reports independent with ADLs.  Son reports pt has had increased difficulty over the past month.            OT Problem List: Decreased strength;Decreased knowledge of use of DME or AE;Decreased activity tolerance;Cardiopulmonary status limiting activity;Impaired balance (sitting and/or standing)      OT Treatment/Interventions: Self-care/ADL training;Therapeutic exercise;Patient/family education;Balance training;Energy conservation;Therapeutic activities;DME and/or AE instruction    OT Goals(Current goals can be found in the care plan section) Acute Rehab OT Goals Patient Stated Goal: increase independence OT Goal Formulation: With patient Time For Goal Achievement: 04/21/20 Potential to Achieve Goals: Good  OT Frequency: Min 2X/week   Barriers to D/C:            Co-evaluation              AM-PAC OT "6 Clicks" Daily Activity     Outcome Measure Help from another person eating meals?: A Little Help from another person taking care of personal grooming?: A Little Help from another person toileting, which includes using toliet, bedpan, or urinal?: A Lot Help from another person bathing (including washing, rinsing, drying)?: A Lot Help from  another person to put on and taking off regular upper body clothing?: A Little Help from another person to put on and taking off regular lower body clothing?: A Lot 6 Click Score: 15   End of Session Equipment Utilized During Treatment: Gait belt;Rolling walker Nurse Communication: Mobility status;Other (comment) (low BP)  Activity Tolerance: Treatment limited secondary to medical complications (Comment) (decreased BP) Patient left: in bed;with call bell/phone within reach  OT Visit Diagnosis: Unsteadiness on feet (R26.81);Other abnormalities of gait and mobility (R26.89);Muscle weakness (generalized) (M62.81)                Time: 7654-6503 OT Time Calculation (min): 37 min Charges:  OT General Charges $OT Visit: 1 Visit OT Evaluation $OT Eval Moderate Complexity: 1 Mod OT Treatments $Self Care/Home Management : 8-22 mins  Zenovia Jarred, MSOT, OTR/L Acute Rehabilitation Services [facility] Office Number: [phone] Pager: [phone]  Zenovia Jarred 04/07/2020, 5:45 PM

## 2020-04-07 NOTE — Progress Notes (Signed)
I am surprised by the echocardiogram that he had done.  His left ventricular ejection fraction is down to 30-35%.  6 months ago he base had a normal ejection fraction.  Has been having runs of V. tach.  He is having PVCs.  I know that cardiology is working hard to try to stabilize everything.  His hemoglobin is stable at 9.8.  His platelet count is 24,000.  Given his cardiac status, we again had to be more aggressive to try to maintain his hemoglobin.  I think if his hemoglobin does get too low, he will go into ischemic heart failure.  He looks good this morning.  He feels good this morning.  There is no swelling in his legs.  He is on aggressive diuresis program.  Hopefully they will be switched to something oral.  His renal function is improving.  His BUN is 39 creatinine 1.81.  His calcium is 9.2.  Albumin is 3.2.  I think he did get a dose of Procrit yesterday.  He is not due for another dose of Luspatercept for a week or so.  Maybe, he will be able to go back to his assisted living facility later on this week or possibly over the weekend.  I know that he is getting outstanding care from all the staff on 6 E.   Lattie Haw, MD  [place] [phone]

## 2020-04-07 NOTE — Progress Notes (Signed)
   04/07/20 1514  Mobility  Activity Contraindicated/medical hold   Received pt lying supine in bed napping. Awoke upon entry and was slightly lethargic. Became more alert upon introducing myself and asking him a variation of questions. Pt agreeable to participate in mobility but noticed BP to be lower than normal, rechecked BP and was 85/57. Denied any dizziness/lightheadedness. RN notified. At this time pt is not appropriate to participate in mobility due to low BP. Will follow up as appropriate.

## 2020-04-08 ENCOUNTER — Other Ambulatory Visit: Payer: Self-pay | Admitting: Physician Assistant

## 2020-04-08 DIAGNOSIS — D649 Anemia, unspecified: Secondary | ICD-10-CM | POA: Diagnosis not present

## 2020-04-08 DIAGNOSIS — C9310 Chronic myelomonocytic leukemia not having achieved remission: Secondary | ICD-10-CM | POA: Diagnosis not present

## 2020-04-08 DIAGNOSIS — I5041 Acute combined systolic (congestive) and diastolic (congestive) heart failure: Secondary | ICD-10-CM | POA: Diagnosis not present

## 2020-04-08 DIAGNOSIS — M10271 Drug-induced gout, right ankle and foot: Secondary | ICD-10-CM

## 2020-04-08 DIAGNOSIS — I5032 Chronic diastolic (congestive) heart failure: Secondary | ICD-10-CM

## 2020-04-08 DIAGNOSIS — I951 Orthostatic hypotension: Secondary | ICD-10-CM

## 2020-04-08 DIAGNOSIS — N179 Acute kidney failure, unspecified: Secondary | ICD-10-CM | POA: Diagnosis not present

## 2020-04-08 LAB — CBC WITH DIFFERENTIAL/PLATELET
Abs Immature Granulocytes: 0.74 10*3/uL — ABNORMAL HIGH (ref 0.00–0.07)
Basophils Absolute: 0.1 10*3/uL (ref 0.0–0.1)
Basophils Relative: 1 %
Eosinophils Absolute: 0 10*3/uL (ref 0.0–0.5)
Eosinophils Relative: 0 %
HCT: 31 % — ABNORMAL LOW (ref 39.0–52.0)
Hemoglobin: 10.2 g/dL — ABNORMAL LOW (ref 13.0–17.0)
Immature Granulocytes: 10 %
Lymphocytes Relative: 17 %
Lymphs Abs: 1.2 10*3/uL (ref 0.7–4.0)
MCH: 37.2 pg — ABNORMAL HIGH (ref 26.0–34.0)
MCHC: 32.9 g/dL (ref 30.0–36.0)
MCV: 113.1 fL — ABNORMAL HIGH (ref 80.0–100.0)
Monocytes Absolute: 1.9 10*3/uL — ABNORMAL HIGH (ref 0.1–1.0)
Monocytes Relative: 26 %
Neutro Abs: 3.2 10*3/uL (ref 1.7–7.7)
Neutrophils Relative %: 46 %
Platelets: 27 10*3/uL — CL (ref 150–400)
RBC: 2.74 MIL/uL — ABNORMAL LOW (ref 4.22–5.81)
RDW: 26.7 % — ABNORMAL HIGH (ref 11.5–15.5)
WBC: 7.1 10*3/uL (ref 4.0–10.5)
nRBC: 2.7 % — ABNORMAL HIGH (ref 0.0–0.2)

## 2020-04-08 LAB — COMPREHENSIVE METABOLIC PANEL
ALT: 20 U/L (ref 0–44)
AST: 25 U/L (ref 15–41)
Albumin: 3.3 g/dL — ABNORMAL LOW (ref 3.5–5.0)
Alkaline Phosphatase: 58 U/L (ref 38–126)
Anion gap: 14 (ref 5–15)
BUN: 43 mg/dL — ABNORMAL HIGH (ref 8–23)
CO2: 29 mmol/L (ref 22–32)
Calcium: 9 mg/dL (ref 8.9–10.3)
Chloride: 93 mmol/L — ABNORMAL LOW (ref 98–111)
Creatinine, Ser: 2.03 mg/dL — ABNORMAL HIGH (ref 0.61–1.24)
GFR, Estimated: 28 mL/min — ABNORMAL LOW (ref >60)
Glucose, Bld: 111 mg/dL — ABNORMAL HIGH (ref 70–99)
Potassium: 4 mmol/L (ref 3.5–5.1)
Sodium: 136 mmol/L (ref 135–145)
Total Bilirubin: 2.4 mg/dL — ABNORMAL HIGH (ref 0.3–1.2)
Total Protein: 6.7 g/dL (ref 6.5–8.1)

## 2020-04-08 LAB — RETICULOCYTES
Immature Retic Fract: 35.6 % — ABNORMAL HIGH (ref 2.3–15.9)
RBC.: 2.75 MIL/uL — ABNORMAL LOW (ref 4.22–5.81)
Retic Count, Absolute: 130.6 10*3/uL (ref 19.0–186.0)
Retic Ct Pct: 4.8 % — ABNORMAL HIGH (ref 0.4–3.1)

## 2020-04-08 LAB — MAGNESIUM: Magnesium: 2 mg/dL (ref 1.7–2.4)

## 2020-04-08 LAB — URIC ACID: Uric Acid, Serum: 9.7 mg/dL — ABNORMAL HIGH (ref 3.7–8.6)

## 2020-04-08 LAB — SARS CORONAVIRUS 2 BY RT PCR (HOSPITAL ORDER, PERFORMED IN ~~LOC~~ HOSPITAL LAB): SARS Coronavirus 2: NEGATIVE

## 2020-04-08 LAB — PHOSPHORUS: Phosphorus: 3.3 mg/dL (ref 2.5–4.6)

## 2020-04-08 MED ORDER — MORPHINE SULFATE (PF) 2 MG/ML IV SOLN
1.0000 mg | Freq: Once | INTRAVENOUS | Status: AC | PRN
  Administered 2020-04-10: 1 mg via INTRAVENOUS
  Filled 2020-04-08: qty 1

## 2020-04-08 MED ORDER — COLCHICINE 0.3 MG HALF TABLET
0.3000 mg | ORAL_TABLET | Freq: Two times a day (BID) | ORAL | Status: DC
  Administered 2020-04-08 – 2020-04-10 (×5): 0.3 mg via ORAL
  Filled 2020-04-08 (×6): qty 1

## 2020-04-08 MED ORDER — FUROSEMIDE 40 MG PO TABS
60.0000 mg | ORAL_TABLET | Freq: Every day | ORAL | Status: DC
  Administered 2020-04-08: 60 mg via ORAL
  Filled 2020-04-08: qty 1

## 2020-04-08 MED ORDER — METOPROLOL TARTRATE 12.5 MG HALF TABLET
12.5000 mg | ORAL_TABLET | Freq: Two times a day (BID) | ORAL | Status: AC
  Administered 2020-04-08: 12.5 mg via ORAL
  Filled 2020-04-08: qty 1

## 2020-04-08 MED ORDER — FUROSEMIDE 40 MG PO TABS
60.0000 mg | ORAL_TABLET | Freq: Every day | ORAL | Status: DC
  Administered 2020-04-09 – 2020-04-10 (×2): 60 mg via ORAL
  Filled 2020-04-08 (×2): qty 1

## 2020-04-08 MED ORDER — METOPROLOL SUCCINATE ER 25 MG PO TB24
25.0000 mg | ORAL_TABLET | Freq: Every day | ORAL | Status: DC
  Administered 2020-04-09 – 2020-04-10 (×2): 25 mg via ORAL
  Filled 2020-04-08 (×2): qty 1

## 2020-04-08 MED ORDER — METOPROLOL TARTRATE 12.5 MG HALF TABLET
12.5000 mg | ORAL_TABLET | Freq: Two times a day (BID) | ORAL | Status: DC
  Administered 2020-04-08: 12.5 mg via ORAL
  Filled 2020-04-08: qty 1

## 2020-04-08 MED ORDER — COLCHICINE 0.3 MG HALF TABLET
0.3000 mg | ORAL_TABLET | Freq: Two times a day (BID) | ORAL | Status: DC
  Filled 2020-04-08: qty 1

## 2020-04-08 NOTE — Progress Notes (Signed)
Progress Note  Patient Name: Jeremiah Walker Date of Encounter: 04/08/2020  [facility] Cardiologist: Donato Heinz, MD   Subjective   Pt feels well, has not slept while in the hospital. Wants to go home ASAP.  Inpatient Medications    Scheduled Meds: . vitamin C  1,000 mg Oral Daily  . calcium carbonate  1,250 mg Oral Q breakfast  . cholecalciferol  2,000 Units Oral Daily  . colchicine  0.3 mg Oral BID  . folic acid  2 mg Oral Daily  . furosemide  60 mg Intravenous Daily  . gabapentin  300 mg Oral BID  . melatonin  10 mg Oral QHS  . zinc sulfate  220 mg Oral Daily   Continuous Infusions:  PRN Meds: acetaminophen **OR** acetaminophen, ALPRAZolam, guaiFENesin-dextromethorphan, HYDROcodone-acetaminophen, morphine injection, ondansetron **OR** ondansetron (ZOFRAN) IV, polyethylene glycol, polyvinyl alcohol   Vital Signs    Vitals:   04/07/20 1829 04/07/20 1956 04/08/20 0042 04/08/20 0420  BP: (!) 101/59 100/60 91/68 114/73  Pulse: 90 95 93 92  Resp: 18 18 18    Temp: 98.4 F (36.9 C) 98.4 F (36.9 C) 97.9 F (36.6 C)   TempSrc: Oral Oral Oral   SpO2: 99% 95% 96% 100%  Weight:      Height:        Intake/Output Summary (Last 24 hours) at 04/08/2020 5784 Last data filed at 04/08/2020 0617 Gross per 24 hour  Intake 240 ml  Output 1100 ml  Net -860 ml   Last 3 Weights 04/07/2020 04/06/2020 04/06/2020  Weight (lbs) 178 lb 5.6 oz 182 lb 15.7 oz 189 lb 9.5 oz  Weight (kg) 80.9 kg 83 kg 86 kg      Telemetry    Sinus to sinus tachycardia with PVCs and NSVT (4-beat run)  - Personally Reviewed  ECG    No new tracings - Personally Reviewed  Physical Exam   GEN: elderly male, no acute distress.   Neck: No JVD Cardiac: RRR, 3/6 systolic murmur Respiratory: crackles in right base GI: Soft, nontender, non-distended  MS: No edema; No deformity. Neuro:  Nonfocal  Psych: Normal affect   Labs    High Sensitivity Troponin:   Recent Labs  Lab  04/04/20 1805 04/04/20 1944  TROPONINIHS 302* 301*      Chemistry Recent Labs  Lab 04/04/20 1805 04/05/20 0504 04/06/20 0302 04/07/20 0251 04/08/20 0230  NA 139   < > 137 136 136  K 3.9   < > 3.0* 3.5 4.0  CL 103   < > 93* 93* 93*  CO2 22   < > 30 30 29   GLUCOSE 140*   < > 108* 111* 111*  BUN 53*   < > 41* 39* 43*  CREATININE 2.57*   < > 1.88* 1.81* 2.03*  CALCIUM 9.2   < > 9.4 9.2 9.0  PROT 6.4*  --  6.7  --  6.7  ALBUMIN 3.3*  --  3.4* 3.2* 3.3*  AST 25  --  28  --  25  ALT 22  --  20  --  20  ALKPHOS 63  --  57  --  58  BILITOT 2.3*  --  3.7*  --  2.4*  GFRNONAA 21*   < > 31* 32* 28*  ANIONGAP 14   < > 14 13 14    < > = values in this interval not displayed.     Hematology Recent Labs  Lab 04/06/20 0302 04/06/20 0302 04/07/20  1749 04/08/20 0230 04/08/20 0231  WBC 7.8  --  7.7 7.1  --   RBC 2.75*   < > 2.59* 2.74* 2.75*  HGB 10.1*  --  9.8* 10.2*  --   HCT 31.2*  --  28.8* 31.0*  --   MCV 113.5*  --  111.2* 113.1*  --   MCH 36.7*  --  37.8* 37.2*  --   MCHC 32.4  --  34.0 32.9  --   RDW 27.6*  --  26.8* 26.7*  --   PLT 24*  --  24* 27*  --    < > = values in this interval not displayed.    BNP Recent Labs  Lab 04/04/20 1805  BNP 1,658.0*     DDimer No results for input(s): DDIMER in the last 168 hours.   Radiology    No results found.  Cardiac Studies   Echo 04/05/20: 1. Left ventricular ejection fraction, by estimation, is 30 to 35%. The  left ventricle has moderately decreased function. The left ventricle  demonstrates global hypokinesis. There is mild concentric left ventricular  hypertrophy. Left ventricular  diastolic parameters are consistent with Grade III diastolic dysfunction  (restrictive). Elevated left atrial pressure. There is severe hypokinesis  of the left ventricular, entire inferior wall and inferolateral wall.  2. Right ventricular systolic function is normal. The right ventricular  size is normal. There is normal  pulmonary artery systolic pressure. The  estimated right ventricular systolic pressure is 44.9 mmHg.  3. Left atrial size was severely dilated.  4. The mitral valve is normal in structure. Mild mitral valve  regurgitation. Moderate mitral annular calcification.  5. The aortic valve is tricuspid. There is moderate calcification of the  aortic valve. There is moderate thickening of the aortic valve. Aortic  valve regurgitation is not visualized. Moderate aortic valve stenosis.   Echo 09/26/19: 1. Left ventricular ejection fraction, by estimation, is 55 to 60%. The  left ventricle has normal function. The left ventricle has no regional  wall motion abnormalities. There is mild left ventricular hypertrophy.  Left ventricular diastolic parameters  are consistent with Grade I diastolic dysfunction (impaired relaxation).  Elevated left ventricular end-diastolic pressure.  2. Right ventricular systolic function is normal. The right ventricular  size is normal.  3. Left atrial size was severely dilated.  4. The mitral valve is abnormal. Trivial mitral valve regurgitation. The  mean mitral valve gradient is 3.0 mmHg.  5. The aortic valve is tricuspid. Aortic valve regurgitation is trivial.  Moderate aortic valve stenosis. Aortic valve area, by VTI measures 1.03  cm. Aortic valve mean gradient measures 17.6 mmHg. Aortic valve Vmax  measures 2.83 m/s.  6. The inferior vena cava is normal in size with greater than 50%  respiratory variability, suggesting right atrial pressure of 3 mmHg.    Patient Profile     86 y.o. male with a PMH of chronic diastolic CHF, HEN, DLBCL, CML, CKD III/IV,who is being followed by cardiology for the evaluation of worsening SOBfound to have newly reduced EF to 30-35%.  Assessment & Plan    Acute systolic and diastolic heart failure - EF dropped to 30-35% with global hypokinesis worse in the inferior and inferolateral myocardium - grade III DD, worse  from prior echo that estimated grade I DD - overall net negative 3.5 L with at least 1.1 L urine output yesterday - no weight recorded this morning, crackles in right base --> pt states he is at baseline -  he has diuresed on 60 mg IV lasix --> switch to PO lasix today - not a candidate for PCI given chronic anemia - will opt to medically treat --> started lopressor 12.5 mg BID today - give renal function, will not start ACEI/ARB/ARNI - no BP room for hydralazine/imdur - consider repeating an echo --> if EF remains low with NSVT, may need to refer to EP to consider ICD   NSVT, PVCs - BB as above - pt is asymptomatic   Moderate AS - surveillance echos   Elevated troponin - hs troponin 302 --> 301 - suspect demand ischemia - pt is not a candidate for intervention given his chronic anemia and PLT count - will medically manage with BB as above --> can add imdur next as BP tolerates   Anemia Thrombocytopenia  - received 2 U PRBC - Hb improved to 10.2 - PLT 27k   CKD stage III - sCr 2.03 (1.81) --> baseline appears to be 1.8-2.0 - will need BMP in 1 week     I will arrange cardiology follow up. BMP in 1 week.   For questions or updates, please contact [facility] Please consult [email or web address] for contact info under        Signed, Ledora Bottcher, PA  04/08/2020, 8:08 AM

## 2020-04-08 NOTE — Progress Notes (Signed)
Patient c/o 8/10 right ankle pain not relieved by Norco or Tylenol. On call Dr. Marlowe Sax made aware; order received for Morphine 1 mg x 1. Will continue to monitor patient.

## 2020-04-08 NOTE — Progress Notes (Signed)
Physical Therapy Treatment Patient Details Name: Jeremiah Walker MRN: [id number] DOB: [birth year] Today's Date: 04/08/2020    History of Present Illness Pt is 86 y.o. male with medical history significant of DLBCL in remission, CMML / MDS, transfusion dependent.  Looks like this has been progressing with worsening thrombocytopenia over the past month or two.  He was recently switched him from [place]p to [place] in an attempt to treat.  Last PRBC transfusion was 3 weeks ago which brought HGB from 8.1 to 8.9.  Pt admitted with symptomatic anemia and has had 1 unit PRBC.  Did not platelets are 27.    PT Comments    Pt sitting on BSC on entry, pt had performed self pericare and ready to get back to bed. Pt is min guard for stand pivot transfer back to bed. Once there requests additional pericare from therapist, to "feel really clean." Pt reports that he is tired and would just like to some exercises. Pt was hopeful for discharge back to [facility] this afternoon however thinks it will probably happen tomorrow.    Follow Up Recommendations  SNF (SNF with possible transition to ALF from ILF after SNF)     Equipment Recommendations  None recommended by PT       Precautions / Restrictions Precautions Precautions: Fall Precaution Comments: Low Platelets Restrictions Weight Bearing Restrictions: No    Mobility  Bed Mobility Overal bed mobility: Needs Assistance Bed Mobility: Supine to Sit       Sit to supine: Min guard   General bed mobility comments: min guard for safety with return to bed with HOB elevated  Transfers Overall transfer level: Needs assistance Equipment used: Rolling walker (2 wheeled) Transfers: Sit to/from [facility] Sit to Stand: Min guard Stand pivot transfers: Min guard       General transfer comment: min guard for stand pivot transfer from [facility] to bed, min guard to come to standing for total assist with pericare         Balance  Overall balance assessment: Needs assistance Sitting-balance support: No upper extremity supported Sitting balance-Leahy Scale: Good     Standing balance support: No upper extremity supported Standing balance-Leahy Scale: Fair Standing balance comment: improved dynamic stability with RW                            Cognition Arousal/Alertness: Awake/alert Behavior During Therapy: WFL for tasks assessed/performed Overall Cognitive Status: Within Functional Limits for tasks assessed                                 General Comments: pt realistic about return to [facility] and downsizing       Exercises General Exercises - Lower Extremity Ankle Circles/Pumps: AROM;Both;10 reps;Seated Long Arc Quad: AROM;Both;10 reps;Seated Hip Flexion/Marching: AROM;Both;10 reps Other Exercises Other Exercises: 10x sit to stand    General Comments General comments (skin integrity, edema, etc.): Pt is dissappointed to not be going back to [facility] today       Pertinent Vitals/Pain Pain Assessment: No/denies pain           PT Goals (current goals can now be found in the care plan section) Acute Rehab PT Goals Patient Stated Goal: increase independence PT Goal Formulation: With patient/family Time For Goal Achievement: 04/19/20 Potential to Achieve Goals: Good Progress towards PT goals: Progressing toward goals    Frequency  Min 3X/week      PT Plan Current plan remains appropriate       AM-PAC PT "6 Clicks" Mobility   Outcome Measure  Help needed turning from your back to your side while in a flat bed without using bedrails?: A Little Help needed moving from lying on your back to sitting on the side of a flat bed without using bedrails?: A Little Help needed moving to and from a bed to a chair (including a wheelchair)?: A Little Help needed standing up from a chair using your arms (e.g., wheelchair or bedside chair)?: A Little Help needed to walk in  hospital room?: A Little Help needed climbing 3-5 steps with a railing? : A Little 6 Click Score: 18    End of Session   Activity Tolerance: Patient tolerated treatment well Patient left: with call bell/phone within reach;with family/visitor present;in chair (sitting EOB, son close by) Nurse Communication: Mobility status;Other (comment) (on 3 LPM at home) PT Visit Diagnosis: Unsteadiness on feet (R26.81);Other abnormalities of gait and mobility (R26.89)     Time: 7615-1834 PT Time Calculation (min) (ACUTE ONLY): 25 min  Charges:  $Therapeutic Exercise: 8-22 mins $Therapeutic Activity: 8-22 mins                     Garris Melhorn B. Migdalia Dk PT, DPT Acute Rehabilitation Services Pager [phone] Office [phone]    [place] 04/08/2020, 5:10 PM

## 2020-04-08 NOTE — Progress Notes (Signed)
It looks like Mr. Runk never had a gouty attack.  He woke up with pain in the big toe of his right foot.  He said it was swollen red.  His uric acid is 9.7.  I would probably get him on some colchicine.  I think this would be reasonable.  I think he probably was on allopurinol chronically.  His CBC is stable.  His white cell count 7.1.  Hemoglobin 10.2.  Platelet count is 27,000.  His BUN and creatinine are 43 and 2.03.  He is on a fairly aggressive diuretic program.  He was seen by cardiology.  They will add metoprolol.  His appetite is good.  He has had no nausea or vomiting.  He has had no bleeding.  He has had no problems with pain outside of that with the big toe on the right foot.  He has had no issues with fever.  No nausea or vomiting.  I am not sure how much he really is able to do with physical therapy.  I am sure when he goes home, he will have to go to a more step up location at his assisted living center.  His temperature is 97.9.  Pulse 92.  Blood pressure 114/73.  His lungs sound pretty clear.  Cardiac exam regular rate and rhythm.  He has an occasional extra beat.  Abdomen is soft.  He has good bowel sounds.  There is no guarding or rebound tenderness.  Extremities shows the swelling of the big toe of the right foot.  There is some tenderness to palpation of the toe.  Neurological exam is nonfocal.  Skin exam shows some scattered ecchymoses.  Hopefully, the heart failure is improved.  He looks pretty good.  He has been aggressively diuresed.  I suspect his BUN and creatinine are up because of the diuresis.  His CBC is holding steady.  He did get a dose of Procrit 3 days ago.  Maybe, he will be able to go back to his assisted living center over the weekend.  Lattie Haw, MD  Proverbs 17:17

## 2020-04-08 NOTE — Care Management Important Message (Signed)
Important Message  Patient Details  Name: Jeremiah Walker MRN: [id number] Date of Birth: 28-Oct-1927   Medicare Important Message Given:  Yes     Shelda Altes 04/08/2020, 12:23 PM

## 2020-04-08 NOTE — Progress Notes (Signed)
PROGRESS NOTE  Jeremiah Walker STM:[id number] DOB: 02-Jul-1927   PCP: [name], MD  Patient is from: [facility] living [facility] of [facility]  DOA: 04/04/2020 LOS: 4  Chief complaints: Increased shortness of breath, generalized weakness and dizziness  Brief Narrative / Interim history: 86 year old male with history of diffuse large cell non-Hodgkin's lymphoma in remission, MDS/CMML with transfusion dependence, combined CHF, CKD-3B and chronic RF on 3 L presenting with the above chief complaints, and admitted for symptomatic anemia, acute on chronic combined CHF,  AKI on CKD-3B and UTI.  Patient was previously on Aranesp and recently switched to [place].   On admission, hemodynamically stable.  Hgb 7.8 (baseline 8-9).  MCV 122.  Plt 24 (41 on 9/30). Cr 2.57 (1.94 on 9/30).  BUN 53 (32 9/30).  Total bili 2.3.  BNP 1658. HS trop 302>301.  Lactic acid 2.1.  UA with small Hgb, trace LE and rare bacteria. CXR with increased vascular congestion with evidence of mild interstitial edema.  G sinus rhythm with nonspecific T wave changes in lateral leads.  COVID-19 and influenza PCR negative.  Cardiology and oncology consulted.   Patient received blood transfusion.  Hemoglobin improved to 10.1.  Platelets remain low at 24.  He was also started on IV Lasix.  AKI and cardiopulmonary symptoms improved.  Elevated troponin thought to be demand ischemia and delayed clearance in the setting of renal failure.   Urine culture with less than 10,000 colonies (insignificant growth).  IV ceftriaxone discontinued.  Cardiology and oncology following.  Therapy recommended SNF.  Subjective: Seen and examined earlier this morning.  Had significant pain in his right ankle overnight.  Uric acid elevated to 9.7.  He was a started on colchicine earlier this morning.  Otherwise, no complaints.  He denies chest pain, dyspnea, cough, dizziness or lightheadedness.  Continues to have excellent urine output.  Creatinine  slightly up trended.  Objective: Vitals:   04/07/20 1956 04/08/20 0042 04/08/20 0420 04/08/20 0858  BP: 100/60 91/68 114/73 (!) 101/58  Pulse: 95 93 92 91  Resp: 18 18    Temp: 98.4 F (36.9 C) 97.9 F (36.6 C)    TempSrc: Oral Oral    SpO2: 95% 96% 100%   Weight:      Height:        Intake/Output Summary (Last 24 hours) at 04/08/2020 1340 Last data filed at 04/08/2020 0617 Gross per 24 hour  Intake 240 ml  Output 1100 ml  Net -860 ml   Filed Weights   04/06/20 0715 04/06/20 1400 04/07/20 0500  Weight: 86 kg 83 kg 80.9 kg    Examination:  GENERAL: No apparent distress.  Nontoxic. HEENT: MMM.  Vision and hearing grossly intact.  NECK: Supple.  No apparent JVD.  RESP: 99% on 2 L.  No IWOB.  Fair aeration bilaterally. CVS:  RRR.  3/6 SEM over RUSB and LUSB.  Heart sounds normal.  ABD/GI/GU: BS+. Abd soft, NTND.  MSK/EXT:  Moves extremities.  Mild erythema/swelling around lateral malleolus in the right.  Slightly tender. SKIN: no apparent skin lesion or wound NEURO: Awake, alert and oriented appropriately.  No apparent focal neuro deficit. PSYCH: Calm. Normal affect.  Procedures:  None  Microbiology summarized: COVID-19 PCR negative. Influenza PCR negative. Urine culture with less than 10,000 colonies (insignificant growth).  Assessment & Plan: Symptomatic anemia of chronic disease/CMML: Hgb 7.8 (admit)>1u?>> 10.1>10.2.  It seems he has received 1 unit of blood on 10/12 although this was not clearly documented.  Received  Epogen on 10/13. -Continue monitoring  Acute on chronic combined CHF: Echo with EF of 30 to 35% (normal LVEF in 09/2019), global hypokinesis, G3-DD and moderate aortic stenosis.  BNP elevated to 1658 on admit.  Personally reviewed CXR consistent with CHF.  Responded to IV Lasix.  About 1.1 L UOP overnight.  Net -3.6 L.  Renal function is slightly up after initial improvement. -Per cardiology-Lasix on hold.   -GDMT low-dose metoprolol.  Not a  candidate for ARB/Entresto -Monitor fluid status, renal function and electrolytes -Sodium and fluid restrictions  Orthostatic hypotension: BP dropped from 104/52-73/49 from lying to standing on 10/13.  No dizziness but generalized weakness at that time.  He feels better today.  Likely due to overdiuresis.  He was also started on low-dose metoprolol -Lasix on hold today -Recheck orthostatic vitals.  Elevated troponin: Likely demand ischemia in the setting of anemia and CHF. HS trop 302>>301.  EKG sinus rhythm with nonspecific T wave changes in lateral leads.  Patient without chest pain. -Manage anemia and CHF as above.  NSVT: had 10 runs of NSVT the night of 10/13-10/14 -Low-dose metoprolol per cardiology -Optimize electrolytes.  AKI on CKD-3B/azotemia: Cr 2.57>> 1.81> 2.03.  -Agree with holding Lasix. -Recheck in the morning.  Pyuria versus UTI: No UTI symptoms.  Urine culture with insignificant growth.  Received IV ceftriaxone x1 in ED.  No indication for antibiotics  Chronic thrombocytopenia likely due to MDS.  Platelet improved, 27k today.  no evidence of bleeding. -Continue monitoring-transfuse for platelets <20k or bleeding. -SCD for VTE prophylaxis.   Chronic respiratory failure: On 3 L at home.  Currently on 2 L. -Continue monitoring.  Acute gout flareup in right ankle: Uric acid elevated. -Agree with low-dose colchicine  Hypokalemia: Resolved. -K-Dur 40 mEq x 2 -Continue monitoring  Generalized weakness: Uses walker at baseline. -PT/OT eval   Body mass index is 26.34 kg/m.         DVT prophylaxis:  SCDs Start: 04/04/20 2153  Code Status: DNR/DNI Family Communication: Updated grandson, Dr. Evette Doffing over the phone. Status is: Inpatient  Remains inpatient appropriate because:IV treatments appropriate due to intensity of illness or inability to take PO and Inpatient level of care appropriate due to severity of illness   Dispo: The patient is from:  [facility] living facility              Anticipated d/c is to: SNF              Anticipated d/c date is: 1 day              Patient currently is not medically stable to d/c.       Consultants:  Cardiology Oncology   Sch Meds:  Scheduled Meds: . vitamin C  1,000 mg Oral Daily  . calcium carbonate  1,250 mg Oral Q breakfast  . cholecalciferol  2,000 Units Oral Daily  . colchicine  0.3 mg Oral BID  . folic acid  2 mg Oral Daily  . [START ON 04/09/2020] furosemide  60 mg Oral Daily  . gabapentin  300 mg Oral BID  . melatonin  10 mg Oral QHS  . [START ON 04/09/2020] metoprolol succinate  25 mg Oral Daily  . metoprolol tartrate  12.5 mg Oral BID  . zinc sulfate  220 mg Oral Daily   Continuous Infusions: PRN Meds:.acetaminophen **OR** acetaminophen, ALPRAZolam, guaiFENesin-dextromethorphan, HYDROcodone-acetaminophen, morphine injection, ondansetron **OR** ondansetron (ZOFRAN) IV, polyethylene glycol, polyvinyl alcohol  Antimicrobials: Anti-infectives (From admission, onward)  Start     Dose/Rate Route Frequency Ordered Stop   04/04/20 1930  cefTRIAXone (ROCEPHIN) 1 g in sodium chloride 0.9 % 100 mL IVPB        1 g 200 mL/hr over 30 Minutes Intravenous  Once 04/04/20 1919 04/04/20 2035       I have personally reviewed the following labs and images: CBC: Recent Labs  Lab 04/04/20 1805 04/05/20 0504 04/06/20 0302 04/07/20 0251 04/08/20 0230  WBC 7.3 8.2 7.8 7.7 7.1  NEUTROABS 3.5  --   --   --  3.2  HGB 7.8* 8.8* 10.1* 9.8* 10.2*  HCT 24.6* 27.2* 31.2* 28.8* 31.0*  MCV 122.4* 111.9* 113.5* 111.2* 113.1*  PLT 24* 27* 24* 24* 27*   BMP &GFR Recent Labs  Lab 04/04/20 1805 04/05/20 0504 04/06/20 0302 04/06/20 1416 04/07/20 0251 04/08/20 0230  NA 139 141 137  --  136 136  K 3.9 3.7 3.0*  --  3.5 4.0  CL 103 100 93*  --  93* 93*  CO2 22 27 30   --  30 29  GLUCOSE 140* 109* 108*  --  111* 111*  BUN 53* 47* 41*  --  39* 43*  CREATININE 2.57* 2.11* 1.88*  --   1.81* 2.03*  CALCIUM 9.2 9.5 9.4  --  9.2 9.0  MG 2.1  --   --  1.6* 2.1 2.0  PHOS  --   --   --   --  3.2 3.3   Estimated Creatinine Clearance: 24.2 mL/min (A) (by C-G formula based on SCr of 2.03 mg/dL (H)). Liver & Pancreas: Recent Labs  Lab 04/04/20 1805 04/06/20 0302 04/07/20 0251 04/08/20 0230  AST 25 28  --  25  ALT 22 20  --  20  ALKPHOS 63 57  --  58  BILITOT 2.3* 3.7*  --  2.4*  PROT 6.4* 6.7  --  6.7  ALBUMIN 3.3* 3.4* 3.2* 3.3*   No results for input(s): LIPASE, AMYLASE in the last 168 hours. No results for input(s): AMMONIA in the last 168 hours. Diabetic: No results for input(s): HGBA1C in the last 72 hours. No results for input(s): GLUCAP in the last 168 hours. Cardiac Enzymes: No results for input(s): CKTOTAL, CKMB, CKMBINDEX, TROPONINI in the last 168 hours. Recent Labs    12/02/19 1139  PROBNP 1,021.0*   Coagulation Profile: No results for input(s): INR, PROTIME in the last 168 hours. Thyroid Function Tests: No results for input(s): TSH, T4TOTAL, FREET4, T3FREE, THYROIDAB in the last 72 hours. Lipid Profile: No results for input(s): CHOL, HDL, LDLCALC, TRIG, CHOLHDL, LDLDIRECT in the last 72 hours. Anemia Panel: Recent Labs    04/08/20 0231  RETICCTPCT 4.8*   Urine analysis:    Component Value Date/Time   COLORURINE YELLOW 04/04/2020 1823   APPEARANCEUR CLEAR 04/04/2020 1823   LABSPEC 1.017 04/04/2020 1823   PHURINE 5.0 04/04/2020 1823   GLUCOSEU NEGATIVE 04/04/2020 1823   HGBUR SMALL (A) 04/04/2020 1823   BILIRUBINUR NEGATIVE 04/04/2020 1823   BILIRUBINUR negative 12/11/2017 [place] 04/04/2020 1823   PROTEINUR 30 (A) 04/04/2020 1823   UROBILINOGEN 1.0 12/11/2017 1507   NITRITE NEGATIVE 04/04/2020 1823   LEUKOCYTESUR TRACE (A) 04/04/2020 1823   Sepsis Labs: Invalid input(s): PROCALCITONIN, [place]  Microbiology: Recent Results (from the past 240 hour(s))  Respiratory Panel by RT PCR (Flu A&B, Covid) -  Nasopharyngeal Swab     Status: None   Collection Time: 04/04/20  6:11 PM  Specimen: Nasopharyngeal Swab  Result Value Ref Range Status   SARS Coronavirus 2 by RT PCR NEGATIVE NEGATIVE Final    Comment: (NOTE) SARS-CoV-2 target nucleic acids are NOT DETECTED.  The SARS-CoV-2 RNA is generally detectable in upper respiratoy specimens during the acute phase of infection. The lowest concentration of SARS-CoV-2 viral copies this assay can detect is 131 copies/mL. A negative result does not preclude SARS-Cov-2 infection and should not be used as the sole basis for treatment or other patient management decisions. A negative result may occur with  improper specimen collection/handling, submission of specimen other than nasopharyngeal swab, presence of viral mutation(s) within the areas targeted by this assay, and inadequate number of viral copies (<131 copies/mL). A negative result must be combined with clinical observations, patient history, and epidemiological information. The expected result is Negative.  Fact Sheet for Patients:  PinkCheek.be  Fact Sheet for Healthcare Providers:  GravelBags.it  This test is no t yet approved or cleared by the [place] FDA and  has been authorized for detection and/or diagnosis of SARS-CoV-2 by FDA under an Emergency Use Authorization (EUA). This EUA will remain  in effect (meaning this test can be used) for the duration of the COVID-19 declaration under Section 564(b)(1) of the Act, 21 U.S.C. section 360bbb-3(b)(1), unless the authorization is terminated or revoked sooner.     Influenza A by PCR NEGATIVE NEGATIVE Final   Influenza B by PCR NEGATIVE NEGATIVE Final    Comment: (NOTE) The Xpert Xpress SARS-CoV-2/FLU/RSV assay is intended as an aid in  the diagnosis of influenza from Nasopharyngeal swab specimens and  should not be used as a sole basis for treatment. Nasal washings and    aspirates are unacceptable for Xpert Xpress SARS-CoV-2/FLU/RSV  testing.  Fact Sheet for Patients: PinkCheek.be  Fact Sheet for Healthcare Providers: GravelBags.it  This test is not yet approved or cleared by the [place] FDA and  has been authorized for detection and/or diagnosis of SARS-CoV-2 by  FDA under an Emergency Use Authorization (EUA). This EUA will remain  in effect (meaning this test can be used) for the duration of the  Covid-19 declaration under Section 564(b)(1) of the Act, 21  U.S.C. section 360bbb-3(b)(1), unless the authorization is  terminated or revoked. Performed at [facility], [place] [address], [place], [place] [address]   Urine culture     Status: Abnormal   Collection Time: 04/04/20  7:20 PM   Specimen: Urine, Random  Result Value Ref Range Status   Specimen Description URINE, RANDOM  Final   Special Requests NONE  Final   Culture (A)  Final    <10,000 COLONIES/mL INSIGNIFICANT GROWTH Performed at [facility], [place] [address], [place],  [address]    Report Status 04/05/2020 FINAL  Final  SARS Coronavirus 2 by RT PCR (hospital order, performed in [facility] hospital lab) Nasopharyngeal Nasopharyngeal Swab     Status: None   Collection Time: 04/07/20  4:12 AM   Specimen: Nasopharyngeal Swab  Result Value Ref Range Status   SARS Coronavirus 2 NEGATIVE NEGATIVE Final    Comment: (NOTE) SARS-CoV-2 target nucleic acids are NOT DETECTED.  The SARS-CoV-2 RNA is generally detectable in upper and lower respiratory specimens during the acute phase of infection. The lowest concentration of SARS-CoV-2 viral copies this assay can detect is 250 copies / mL. A negative result does not preclude SARS-CoV-2 infection and should not be used as the sole basis for treatment or other patient  management decisions.  A negative result may occur with improper specimen collection / handling,  submission of specimen other than nasopharyngeal swab, presence of viral mutation(s) within the areas targeted by this assay, and inadequate number of viral copies (<250 copies / mL). A negative result must be combined with clinical observations, patient history, and epidemiological information.  Fact Sheet for Patients:   StrictlyIdeas.no  Fact Sheet for Healthcare Providers: BankingDealers.co.za  This test is not yet approved or  cleared by the [place] FDA and has been authorized for detection and/or diagnosis of SARS-CoV-2 by FDA under an Emergency Use Authorization (EUA).  This EUA will remain in effect (meaning this test can be used) for the duration of the COVID-19 declaration under Section 564(b)(1) of the Act, 21 U.S.C. section 360bbb-3(b)(1), unless the authorization is terminated or revoked sooner.  Performed at [facility], [place] [address], [place], [place] [address]     Radiology Studies: No results found.    Rodrick Payson T. [place]  If 7PM-7AM, please contact night-coverage [email or web address] 04/08/2020, 1:40 PM

## 2020-04-09 DIAGNOSIS — N179 Acute kidney failure, unspecified: Secondary | ICD-10-CM | POA: Diagnosis not present

## 2020-04-09 DIAGNOSIS — D649 Anemia, unspecified: Secondary | ICD-10-CM | POA: Diagnosis not present

## 2020-04-09 DIAGNOSIS — I5041 Acute combined systolic (congestive) and diastolic (congestive) heart failure: Secondary | ICD-10-CM | POA: Diagnosis not present

## 2020-04-09 DIAGNOSIS — C9310 Chronic myelomonocytic leukemia not having achieved remission: Secondary | ICD-10-CM | POA: Diagnosis not present

## 2020-04-09 LAB — CBC
HCT: 29.3 % — ABNORMAL LOW (ref 39.0–52.0)
Hemoglobin: 9.6 g/dL — ABNORMAL LOW (ref 13.0–17.0)
MCH: 37.2 pg — ABNORMAL HIGH (ref 26.0–34.0)
MCHC: 32.8 g/dL (ref 30.0–36.0)
MCV: 113.6 fL — ABNORMAL HIGH (ref 80.0–100.0)
Platelets: 24 10*3/uL — CL (ref 150–400)
RBC: 2.58 MIL/uL — ABNORMAL LOW (ref 4.22–5.81)
RDW: 26.6 % — ABNORMAL HIGH (ref 11.5–15.5)
WBC: 6.2 10*3/uL (ref 4.0–10.5)
nRBC: 1.6 % — ABNORMAL HIGH (ref 0.0–0.2)

## 2020-04-09 LAB — RENAL FUNCTION PANEL
Albumin: 3 g/dL — ABNORMAL LOW (ref 3.5–5.0)
Anion gap: 13 (ref 5–15)
BUN: 40 mg/dL — ABNORMAL HIGH (ref 8–23)
CO2: 30 mmol/L (ref 22–32)
Calcium: 9 mg/dL (ref 8.9–10.3)
Chloride: 94 mmol/L — ABNORMAL LOW (ref 98–111)
Creatinine, Ser: 1.97 mg/dL — ABNORMAL HIGH (ref 0.61–1.24)
GFR, Estimated: 29 mL/min — ABNORMAL LOW (ref >60)
Glucose, Bld: 90 mg/dL (ref 70–99)
Phosphorus: 4.1 mg/dL (ref 2.5–4.6)
Potassium: 3.5 mmol/L (ref 3.5–5.1)
Sodium: 137 mmol/L (ref 135–145)

## 2020-04-09 LAB — RETICULOCYTES
Immature Retic Fract: 35.5 % — ABNORMAL HIGH (ref 2.3–15.9)
RBC.: 2.55 MIL/uL — ABNORMAL LOW (ref 4.22–5.81)
Retic Count, Absolute: 128.3 10*3/uL (ref 19.0–186.0)
Retic Ct Pct: 5 % — ABNORMAL HIGH (ref 0.4–3.1)

## 2020-04-09 LAB — MAGNESIUM: Magnesium: 2 mg/dL (ref 1.7–2.4)

## 2020-04-09 MED ORDER — METOPROLOL SUCCINATE ER 25 MG PO TB24: 25.0000 mg | ORAL_TABLET | Freq: Every day | ORAL | 1 refills | Status: AC

## 2020-04-09 MED ORDER — ACETAMINOPHEN 500 MG PO TABS: 500.0000 mg | ORAL_TABLET | Freq: Three times a day (TID) | ORAL | 1 refills | Status: AC

## 2020-04-09 MED ORDER — COLCHICINE 0.6 MG PO TABS: 0.3000 mg | ORAL_TABLET | Freq: Two times a day (BID) | ORAL | 0 refills | Status: AC

## 2020-04-09 MED ORDER — FUROSEMIDE 20 MG PO TABS: 60.0000 mg | ORAL_TABLET | Freq: Every day | ORAL | 1 refills | Status: AC

## 2020-04-09 MED ORDER — POTASSIUM CHLORIDE CRYS ER 20 MEQ PO TBCR
40.0000 meq | EXTENDED_RELEASE_TABLET | Freq: Once | ORAL | Status: AC
  Administered 2020-04-09: 40 meq via ORAL
  Filled 2020-04-09: qty 2

## 2020-04-09 NOTE — Progress Notes (Signed)
Progress Note  Patient Name: Jeremiah Walker Date of Encounter: 04/09/2020  [facility] Cardiologist: Donato Heinz, MD   Subjective  Feeling well this morning. Wanting to leave the hospital.  Cr stable at 1.97 Net negative 168 yesterday Appears euvolemic  Inpatient Medications    Scheduled Meds: . vitamin C  1,000 mg Oral Daily  . calcium carbonate  1,250 mg Oral Q breakfast  . cholecalciferol  2,000 Units Oral Daily  . colchicine  0.3 mg Oral BID  . folic acid  2 mg Oral Daily  . furosemide  60 mg Oral Daily  . gabapentin  300 mg Oral BID  . melatonin  10 mg Oral QHS  . metoprolol succinate  25 mg Oral Daily  . zinc sulfate  220 mg Oral Daily   Continuous Infusions:  PRN Meds: acetaminophen **OR** acetaminophen, ALPRAZolam, guaiFENesin-dextromethorphan, HYDROcodone-acetaminophen, morphine injection, ondansetron **OR** ondansetron (ZOFRAN) IV, polyethylene glycol, polyvinyl alcohol   Vital Signs    Vitals:   04/09/20 0328 04/09/20 0655 04/09/20 0812 04/09/20 1023  BP: 105/65  121/68   Pulse: 81  90 84  Resp: 20  18   Temp: 97.7 F (36.5 C)  98.1 F (36.7 C)   TempSrc: Oral  Oral   SpO2: 94%  99%   Weight:  79.9 kg    Height:        Intake/Output Summary (Last 24 hours) at 04/09/2020 1106 Last data filed at 04/09/2020 0329 Gross per 24 hour  Intake 222 ml  Output 750 ml  Net -528 ml   Last 3 Weights 04/09/2020 04/07/2020 04/06/2020  Weight (lbs) 176 lb 2.4 oz 178 lb 5.6 oz 182 lb 15.7 oz  Weight (kg) 79.9 kg 80.9 kg 83 kg      Telemetry     - Personally Reviewed  ECG    No new tracings - Personally Reviewed  Physical Exam   GEN: elderly male, comfortable, asking to go home today  Neck: No JVD Cardiac: RRR, 3/6 systolic murmur Respiratory: crackles in right base (stable) GI: Soft, nontender, non-distended  MS: No edema; No deformity. Wam. Neuro:  Nonfocal  Psych: Normal affect   Labs    High Sensitivity Troponin:     Recent Labs  Lab 04/04/20 1805 04/04/20 1944  TROPONINIHS 302* 301*      Chemistry Recent Labs  Lab 04/04/20 1805 04/05/20 0504 04/06/20 0302 04/06/20 0302 04/07/20 0251 04/08/20 0230 04/09/20 0157  NA 139   < > 137   < > 136 136 137  K 3.9   < > 3.0*   < > 3.5 4.0 3.5  CL 103   < > 93*   < > 93* 93* 94*  CO2 22   < > 30   < > 30 29 30   GLUCOSE 140*   < > 108*   < > 111* 111* 90  BUN 53*   < > 41*   < > 39* 43* 40*  CREATININE 2.57*   < > 1.88*   < > 1.81* 2.03* 1.97*  CALCIUM 9.2   < > 9.4   < > 9.2 9.0 9.0  PROT 6.4*  --  6.7  --   --  6.7  --   ALBUMIN 3.3*  --  3.4*   < > 3.2* 3.3* 3.0*  AST 25  --  28  --   --  25  --   ALT 22  --  20  --   --  20  --   ALKPHOS 63  --  57  --   --  58  --   BILITOT 2.3*  --  3.7*  --   --  2.4*  --   GFRNONAA 21*   < > 31*   < > 32* 28* 29*  ANIONGAP 14   < > 14   < > 13 14 13    < > = values in this interval not displayed.     Hematology Recent Labs  Lab 04/04/20 2130 04/07/20 0251 04/07/20 0251 04/08/20 0230 04/08/20 0231 04/09/20 0157  WBC  --  7.7  --  7.1  --  6.2  RBC   < > 2.59*   < > 2.74* 2.75* 2.58*  2.55*  HGB  --  9.8*  --  10.2*  --  9.6*  HCT  --  28.8*  --  31.0*  --  29.3*  MCV  --  111.2*  --  113.1*  --  113.6*  MCH  --  37.8*  --  37.2*  --  37.2*  MCHC  --  34.0  --  32.9  --  32.8  RDW  --  26.8*  --  26.7*  --  26.6*  PLT  --  24*  --  27*  --  24*   < > = values in this interval not displayed.    BNP Recent Labs  Lab 04/04/20 1805  BNP 1,658.0*     DDimer No results for input(s): DDIMER in the last 168 hours.   Radiology    No results found.  Cardiac Studies   Echo 04/05/20: 1. Left ventricular ejection fraction, by estimation, is 30 to 35%. The  left ventricle has moderately decreased function. The left ventricle  demonstrates global hypokinesis. There is mild concentric left ventricular  hypertrophy. Left ventricular  diastolic parameters are consistent with Grade III  diastolic dysfunction  (restrictive). Elevated left atrial pressure. There is severe hypokinesis  of the left ventricular, entire inferior wall and inferolateral wall.  2. Right ventricular systolic function is normal. The right ventricular  size is normal. There is normal pulmonary artery systolic pressure. The  estimated right ventricular systolic pressure is 29.9 mmHg.  3. Left atrial size was severely dilated.  4. The mitral valve is normal in structure. Mild mitral valve  regurgitation. Moderate mitral annular calcification.  5. The aortic valve is tricuspid. There is moderate calcification of the  aortic valve. There is moderate thickening of the aortic valve. Aortic  valve regurgitation is not visualized. Moderate aortic valve stenosis.   Echo 09/26/19: 1. Left ventricular ejection fraction, by estimation, is 55 to 60%. The  left ventricle has normal function. The left ventricle has no regional  wall motion abnormalities. There is mild left ventricular hypertrophy.  Left ventricular diastolic parameters  are consistent with Grade I diastolic dysfunction (impaired relaxation).  Elevated left ventricular end-diastolic pressure.  2. Right ventricular systolic function is normal. The right ventricular  size is normal.  3. Left atrial size was severely dilated.  4. The mitral valve is abnormal. Trivial mitral valve regurgitation. The  mean mitral valve gradient is 3.0 mmHg.  5. The aortic valve is tricuspid. Aortic valve regurgitation is trivial.  Moderate aortic valve stenosis. Aortic valve area, by VTI measures 1.03  cm. Aortic valve mean gradient measures 17.6 mmHg. Aortic valve Vmax  measures 2.83 m/s.  6. The inferior vena cava is normal in size with greater than 50%  respiratory variability, suggesting right atrial pressure of 3 mmHg.    Patient Profile     86 y.o. male with a PMH of chronic diastolic CHF, HEN, DLBCL, CML, CKD III/IV,who is being followed by  cardiology for the evaluation of worsening SOBfound to have newly reduced EF to 30-35%.  Assessment & Plan    Acute systolic and diastolic heart failure Patient with new EF drop to 30-35% with global hypokinesis worse in the inferior and inferolateral myocardium with grade III diastolic dysfunction. Not a cath candidate at this time due to chronic anemia. Pursuing medical management. Net even yesterday. - Appears euvolemic; essentially net even yesterday - Continue lasix 60mg  PO - Not a candidate for PCI given chronic anemia - Continue metop 25mg  XL - Given renal function, will not start ACEI/ARB/ARNI - No BP room for hydralazine/imdur - Consider repeating an echo as out-patient --> if EF remains low with NSVT, may need to refer to EP to consider ICD - Okay to discharge home on current medications from CV standpoint  NSVT, PVCs Patient asymptomatic and stable - BB as above  Moderate AS - Will need surveillance echos as out-patient  Elevated troponin Hs troponin 302 --> 301. Suspect demand ischemia. Not a candidate for cath due to chronic anemia and thrombocytopenia. Managing medically. -Continue BB as above -No ASA given anemia and thrombocytopenia  Anemia Thrombocytopenia  - received 2 U PRBC - HgB today 9.6 - PLT 24K  CKD stage III - Baseline 1.8-2. Now back to 1.97. - Renally dose medications - Trend     Okay to discharge home from CV standpoint. Follow-up arranged with cardiology per Dr. Oval Linsey.  BMP in 1 week.   For questions or updates, please contact [place] Please consult [email or web address] for contact info under        Signed, Freada Bergeron, MD  04/09/2020, 11:06 AM

## 2020-04-09 NOTE — TOC Progression Note (Addendum)
Transition of Care [facility]) - Progression Note    Patient Details  Name: Jeremiah Walker MRN: [id number] Date of Birth: 1927-08-21  Transition of Care [facility] CM/SW [place], [place] Phone Number: [phone] 04/09/2020, 12:00 PM  Clinical Narrative:     CSW was alerted that patient was medically stable for discharge. CSW reached out to [place] and awaiting a call back.  1:30pm- Called facility again and had to leave another message.  3:45 PM CSW called again and was informed to speak with Peggy. CSW called [facility] and had to leave a message.  TOC team will continue to assist with discharge planning needs.  Expected Discharge Plan: [place] Barriers to Discharge: No Barriers Identified  Expected Discharge Plan and Services Expected Discharge Plan: [place] arrangements for the past 2 months: [facility]) Expected Discharge Date: 04/09/20                                     Social Determinants of Health (SDOH) Interventions    Readmission Risk Interventions Readmission Risk Prevention Plan 04/08/2020  Transportation Screening Complete  Some recent data might be hidden

## 2020-04-09 NOTE — Progress Notes (Signed)
SATURATION QUALIFICATIONS: (This note is used to comply with regulatory documentation for home oxygen)  Patient Saturations on Room Air at Rest = 97 %  Patient Saturations on Room Air while Ambulating =   87 %  Patient Saturations on 2  Liters of oxygen while Ambulating =  96 %  Please briefly explain why patient needs home oxygen:

## 2020-04-09 NOTE — Progress Notes (Signed)
PROGRESS NOTE  Jeremiah Walker WJX:[id number] DOB: 10-16-1927   PCP: [name], MD  Patient is from: Independent living side of [facility]  DOA: 04/04/2020 LOS: 5  Chief complaints: Increased shortness of breath, generalized weakness and dizziness  Brief Narrative / Interim history: 86 year old male with history of diffuse large cell non-Hodgkin's lymphoma in remission, MDS/CMML with transfusion dependence, combined CHF, CKD-3B and chronic RF on 3 L presenting with the above chief complaints, and admitted for symptomatic anemia, acute on chronic combined CHF,  AKI on CKD-3B and UTI.  Patient was previously on Aranesp and recently switched to [place].   On admission, hemodynamically stable.  Hgb 7.8 (baseline 8-9).  MCV 122.  Plt 24 (41 on 9/30). Cr 2.57 (1.94 on 9/30).  BUN 53 (32 9/30).  Total bili 2.3.  BNP 1658. HS trop 302>301.  Lactic acid 2.1.  UA with small Hgb, trace LE and rare bacteria. CXR with increased vascular congestion with evidence of mild interstitial edema.  G sinus rhythm with nonspecific T wave changes in lateral leads.  COVID-19 and influenza PCR negative.  Cardiology and oncology consulted.   Patient received blood transfusion.  Hemoglobin improved to 10.1.  Platelets remain low at 24.  He was also started on IV Lasix.  AKI and cardiopulmonary symptoms improved.  Elevated troponin thought to be demand ischemia and delayed clearance in the setting of renal failure.   Urine culture with less than 10,000 colonies (insignificant growth).  IV ceftriaxone discontinued.  In regards to CHF, he was diuresed with IV Lasix.  Eventually transition to p.o. Lasix. He has been cleared for discharge by cardiology.  Waiting on SNF bed.  Subjective: Seen and examined earlier this morning.  No major events overnight of this morning.  No complaints.  Ankle pain resolved.  Objective: Vitals:   04/09/20 0328 04/09/20 0655 04/09/20 0812 04/09/20 1023  BP: 105/65  121/68     Pulse: 81  90 84  Resp: 20  18   Temp: 97.7 F (36.5 C)  98.1 F (36.7 C)   TempSrc: Oral  Oral   SpO2: 94%  99%   Weight:  79.9 kg    Height:        Intake/Output Summary (Last 24 hours) at 04/09/2020 1517 Last data filed at 04/09/2020 0329 Gross per 24 hour  Intake --  Output 600 ml  Net -600 ml   Filed Weights   04/06/20 1400 04/07/20 0500 04/09/20 0655  Weight: 83 kg 80.9 kg 79.9 kg    Examination:  GENERAL: No apparent distress.  Nontoxic. HEENT: MMM.  Vision and hearing grossly intact.  NECK: Supple.  No apparent JVD.  RESP: 99% on RA.  No IWOB.  Fair aeration bilaterally. CVS:  RRR.  3/6 SEM over RUSB and LUSB. ABD/GI/GU: BS+. Abd soft, NTND.  MSK/EXT:  Moves extremities. No apparent deformity. No edema.  SKIN: no apparent skin lesion or wound NEURO: Awake, alert and oriented appropriately.  No apparent focal neuro deficit. PSYCH: Calm. Normal affect.  Procedures:  None  Microbiology summarized: COVID-19 PCR negative. Influenza PCR negative. Urine culture with less than 10,000 colonies (insignificant growth).  Assessment & Plan: Symptomatic anemia of chronic disease/CMML: Hgb 7.8 (admit)>1u?>> 10.1> 9.6.  It seems he has received 1 unit of blood on 10/12 although this was not clearly documented.  Received Epogen on 10/13. -Continue monitoring -Patient follow-up with oncology  Acute on chronic combined CHF: Echo with EF of 30 to 35% (normal LVEF in 09/2019),  global hypokinesis, G3-DD and moderate aortic stenosis.  BNP elevated to 1658 on admit.  Personally reviewed CXR consistent with CHF.  Responded to IV Lasix.  Diuretics held due to uptrending creatinine.  About 750 cc UOP/24 hours without diuretics..  Net -3.7 L.  Renal function improving. -Transition to p.o. Lasix 60 mg daily by cardiology -GDMT low-dose metoprolol.  Not a candidate for ARB/Entresto.  No BiDil due to soft blood pressures -Monitor fluid status, renal function and electrolytes -Sodium  and fluid restrictions  Orthostatic hypotension: Likely due to overdiuresis.  Seems to have resolved. -Diuretics as above. -Recheck orthostatic vitals.  Elevated troponin: Likely demand ischemia in the setting of anemia and CHF. HS trop 302>>301.  EKG sinus rhythm with nonspecific T wave changes in lateral leads.  Patient without chest pain. -Manage anemia and CHF as above.  NSVT: had 10 runs of NSVT the night of 10/13-10/14 -Metoprolol as above -Optimize electrolytes.  AKI on CKD-3B/azotemia: Cr 2.57>> 1.81> 2.03> 1.97.  BUN 40. -Recheck in the morning.  Pyuria versus UTI: No UTI symptoms.  Urine culture with insignificant growth.  Received IV ceftriaxone x1 in ED.  No indication for antibiotics  Chronic thrombocytopenia likely due to MDS.    Platelet 24, stable.  No evidence of bleeding. -Continue monitoring-transfuse for platelets <20k or bleeding. -SCD for VTE prophylaxis.   Chronic respiratory failure: On 3 L at baseline.  Saturating at 99% on room air this morning. -Ambulatory saturation prior to discharge.   Acute gout flareup in right ankle: Uric acid elevated.  Pain resolved. -Continue colchicine -Resume allopurinol in the next 2 to 3 days  Hypokalemia: K3.5.  Mg 2.0. -K-Dur 40 mEq once -Continue monitoring  Generalized weakness: Uses walker at baseline. -He will continue PT/OT at SNF.   Body mass index is 26.01 kg/m.         DVT prophylaxis:  SCDs Start: 04/04/20 2153  Code Status: DNR/DNI Family Communication: Updated grandson, Dr. Evette Doffing over the phone on 10/15. Status is: Inpatient  Remains inpatient appropriate because:Unsafe d/c plan   Dispo: The patient is from: [facility] living facility              Anticipated d/c is to: SNF              Anticipated d/c date is: 1 day              Patient currently is medically stable to d/c.       Consultants:  Cardiology Oncology   Sch Meds:  Scheduled Meds: . vitamin C  1,000 mg  Oral Daily  . calcium carbonate  1,250 mg Oral Q breakfast  . cholecalciferol  2,000 Units Oral Daily  . colchicine  0.3 mg Oral BID  . folic acid  2 mg Oral Daily  . furosemide  60 mg Oral Daily  . gabapentin  300 mg Oral BID  . melatonin  10 mg Oral QHS  . metoprolol succinate  25 mg Oral Daily  . zinc sulfate  220 mg Oral Daily   Continuous Infusions: PRN Meds:.acetaminophen **OR** acetaminophen, ALPRAZolam, guaiFENesin-dextromethorphan, HYDROcodone-acetaminophen, morphine injection, ondansetron **OR** ondansetron (ZOFRAN) IV, polyethylene glycol, polyvinyl alcohol  Antimicrobials: Anti-infectives (From admission, onward)   Start     Dose/Rate Route Frequency Ordered Stop   04/04/20 1930  cefTRIAXone (ROCEPHIN) 1 g in sodium chloride 0.9 % 100 mL IVPB        1 g 200 mL/hr over 30 Minutes Intravenous  Once 04/04/20 1919 04/04/20 2035  I have personally reviewed the following labs and images: CBC: Recent Labs  Lab 04/04/20 1805 04/04/20 1805 04/05/20 0504 04/06/20 0302 04/07/20 0251 04/08/20 0230 04/09/20 0157  WBC 7.3   < > 8.2 7.8 7.7 7.1 6.2  NEUTROABS 3.5  --   --   --   --  3.2  --   HGB 7.8*   < > 8.8* 10.1* 9.8* 10.2* 9.6*  HCT 24.6*   < > 27.2* 31.2* 28.8* 31.0* 29.3*  MCV 122.4*   < > 111.9* 113.5* 111.2* 113.1* 113.6*  PLT 24*   < > 27* 24* 24* 27* 24*   < > = values in this interval not displayed.   BMP &GFR Recent Labs  Lab 04/04/20 1805 04/04/20 1805 04/05/20 0504 04/06/20 0302 04/06/20 1416 04/07/20 0251 04/08/20 0230 04/09/20 0157  NA 139   < > 141 137  --  136 136 137  K 3.9   < > 3.7 3.0*  --  3.5 4.0 3.5  CL 103   < > 100 93*  --  93* 93* 94*  CO2 22   < > 27 30  --  30 29 30   GLUCOSE 140*   < > 109* 108*  --  111* 111* 90  BUN 53*   < > 47* 41*  --  39* 43* 40*  CREATININE 2.57*   < > 2.11* 1.88*  --  1.81* 2.03* 1.97*  CALCIUM 9.2   < > 9.5 9.4  --  9.2 9.0 9.0  MG 2.1  --   --   --  1.6* 2.1 2.0 2.0  PHOS  --   --   --   --    --  3.2 3.3 4.1   < > = values in this interval not displayed.   Estimated Creatinine Clearance: 24.9 mL/min (A) (by C-G formula based on SCr of 1.97 mg/dL (H)). Liver & Pancreas: Recent Labs  Lab 04/04/20 1805 04/06/20 0302 04/07/20 0251 04/08/20 0230 04/09/20 0157  AST 25 28  --  25  --   ALT 22 20  --  20  --   ALKPHOS 63 57  --  58  --   BILITOT 2.3* 3.7*  --  2.4*  --   PROT 6.4* 6.7  --  6.7  --   ALBUMIN 3.3* 3.4* 3.2* 3.3* 3.0*   No results for input(s): LIPASE, AMYLASE in the last 168 hours. No results for input(s): AMMONIA in the last 168 hours. Diabetic: No results for input(s): HGBA1C in the last 72 hours. No results for input(s): GLUCAP in the last 168 hours. Cardiac Enzymes: No results for input(s): CKTOTAL, CKMB, CKMBINDEX, TROPONINI in the last 168 hours. Recent Labs    12/02/19 1139  PROBNP 1,021.0*   Coagulation Profile: No results for input(s): INR, PROTIME in the last 168 hours. Thyroid Function Tests: No results for input(s): TSH, T4TOTAL, FREET4, T3FREE, THYROIDAB in the last 72 hours. Lipid Profile: No results for input(s): CHOL, HDL, LDLCALC, TRIG, CHOLHDL, LDLDIRECT in the last 72 hours. Anemia Panel: Recent Labs    04/08/20 0231 04/09/20 0157  RETICCTPCT 4.8* 5.0*   Urine analysis:    Component Value Date/Time   COLORURINE YELLOW 04/04/2020 1823   APPEARANCEUR CLEAR 04/04/2020 1823   LABSPEC 1.017 04/04/2020 1823   PHURINE 5.0 04/04/2020 1823   GLUCOSEU NEGATIVE 04/04/2020 1823   HGBUR SMALL (A) 04/04/2020 1823   BILIRUBINUR NEGATIVE 04/04/2020 1823   BILIRUBINUR negative 12/11/2017 1507  KETONESUR NEGATIVE 04/04/2020 1823   PROTEINUR 30 (A) 04/04/2020 1823   UROBILINOGEN 1.0 12/11/2017 1507   NITRITE NEGATIVE 04/04/2020 1823   LEUKOCYTESUR TRACE (A) 04/04/2020 1823   Sepsis Labs: Invalid input(s): PROCALCITONIN, [place]  Microbiology: Recent Results (from the past 240 hour(s))  Respiratory Panel by RT PCR (Flu A&B,  Covid) - Nasopharyngeal Swab     Status: None   Collection Time: 04/04/20  6:11 PM   Specimen: Nasopharyngeal Swab  Result Value Ref Range Status   SARS Coronavirus 2 by RT PCR NEGATIVE NEGATIVE Final    Comment: (NOTE) SARS-CoV-2 target nucleic acids are NOT DETECTED.  The SARS-CoV-2 RNA is generally detectable in upper respiratoy specimens during the acute phase of infection. The lowest concentration of SARS-CoV-2 viral copies this assay can detect is 131 copies/mL. A negative result does not preclude SARS-Cov-2 infection and should not be used as the sole basis for treatment or other patient management decisions. A negative result may occur with  improper specimen collection/handling, submission of specimen other than nasopharyngeal swab, presence of viral mutation(s) within the areas targeted by this assay, and inadequate number of viral copies (<131 copies/mL). A negative result must be combined with clinical observations, patient history, and epidemiological information. The expected result is Negative.  Fact Sheet for Patients:  PinkCheek.be  Fact Sheet for Healthcare Providers:  GravelBags.it  This test is no t yet approved or cleared by the [place] FDA and  has been authorized for detection and/or diagnosis of SARS-CoV-2 by FDA under an Emergency Use Authorization (EUA). This EUA will remain  in effect (meaning this test can be used) for the duration of the COVID-19 declaration under Section 564(b)(1) of the Act, 21 U.S.C. section 360bbb-3(b)(1), unless the authorization is terminated or revoked sooner.     Influenza A by PCR NEGATIVE NEGATIVE Final   Influenza B by PCR NEGATIVE NEGATIVE Final    Comment: (NOTE) The Xpert Xpress SARS-CoV-2/FLU/RSV assay is intended as an aid in  the diagnosis of influenza from Nasopharyngeal swab specimens and  should not be used as a sole basis for treatment. Nasal  washings and  aspirates are unacceptable for Xpert Xpress SARS-CoV-2/FLU/RSV  testing.  Fact Sheet for Patients: PinkCheek.be  Fact Sheet for Healthcare Providers: GravelBags.it  This test is not yet approved or cleared by the [place] FDA and  has been authorized for detection and/or diagnosis of SARS-CoV-2 by  FDA under an Emergency Use Authorization (EUA). This EUA will remain  in effect (meaning this test can be used) for the duration of the  Covid-19 declaration under Section 564(b)(1) of the Act, 21  U.S.C. section 360bbb-3(b)(1), unless the authorization is  terminated or revoked. Performed at [facility], [place] [address] [place], [place] [address]   Urine culture     Status: Abnormal   Collection Time: 04/04/20  7:20 PM   Specimen: Urine, Random  Result Value Ref Range Status   Specimen Description URINE, RANDOM  Final   Special Requests NONE  Final   Culture (A)  Final    <10,000 COLONIES/mL INSIGNIFICANT GROWTH Performed at [facility], [place] [address], [place], [place] [address]    Report Status 04/05/2020 FINAL  Final  SARS Coronavirus 2 by RT PCR (hospital order, performed in [facility] hospital lab) Nasopharyngeal Nasopharyngeal Swab     Status: None   Collection Time: 04/07/20  4:12 AM   Specimen: Nasopharyngeal Swab  Result Value Ref Range Status  SARS Coronavirus 2 NEGATIVE NEGATIVE Final    Comment: (NOTE) SARS-CoV-2 target nucleic acids are NOT DETECTED.  The SARS-CoV-2 RNA is generally detectable in upper and lower respiratory specimens during the acute phase of infection. The lowest concentration of SARS-CoV-2 viral copies this assay can detect is 250 copies / mL. A negative result does not preclude SARS-CoV-2 infection and should not be used as the sole basis for treatment or other patient management decisions.  A negative result may occur with improper specimen collection /  handling, submission of specimen other than nasopharyngeal swab, presence of viral mutation(s) within the areas targeted by this assay, and inadequate number of viral copies (<250 copies / mL). A negative result must be combined with clinical observations, patient history, and epidemiological information.  Fact Sheet for Patients:   StrictlyIdeas.no  Fact Sheet for Healthcare Providers: BankingDealers.co.za  This test is not yet approved or  cleared by the [place] FDA and has been authorized for detection and/or diagnosis of SARS-CoV-2 by FDA under an Emergency Use Authorization (EUA).  This EUA will remain in effect (meaning this test can be used) for the duration of the COVID-19 declaration under Section 564(b)(1) of the Act, 21 U.S.C. section 360bbb-3(b)(1), unless the authorization is terminated or revoked sooner.  Performed at [facility], [place] [address], [place], [place] [address]     Radiology Studies: No results found.    Jilian West T. [place]  If 7PM-7AM, please contact night-coverage [email or web address] 04/09/2020, 3:17 PM

## 2020-04-10 DIAGNOSIS — Z9981 Dependence on supplemental oxygen: Secondary | ICD-10-CM | POA: Diagnosis not present

## 2020-04-10 DIAGNOSIS — N189 Chronic kidney disease, unspecified: Secondary | ICD-10-CM | POA: Diagnosis not present

## 2020-04-10 DIAGNOSIS — Z881 Allergy status to other antibiotic agents status: Secondary | ICD-10-CM | POA: Diagnosis not present

## 2020-04-10 DIAGNOSIS — G8929 Other chronic pain: Secondary | ICD-10-CM | POA: Diagnosis not present

## 2020-04-10 DIAGNOSIS — Z886 Allergy status to analgesic agent status: Secondary | ICD-10-CM | POA: Diagnosis not present

## 2020-04-10 DIAGNOSIS — D6481 Anemia due to antineoplastic chemotherapy: Secondary | ICD-10-CM | POA: Diagnosis not present

## 2020-04-10 DIAGNOSIS — F419 Anxiety disorder, unspecified: Secondary | ICD-10-CM | POA: Diagnosis not present

## 2020-04-10 DIAGNOSIS — J9610 Chronic respiratory failure, unspecified whether with hypoxia or hypercapnia: Secondary | ICD-10-CM | POA: Diagnosis not present

## 2020-04-10 DIAGNOSIS — Z9181 History of falling: Secondary | ICD-10-CM | POA: Diagnosis not present

## 2020-04-10 DIAGNOSIS — R778 Other specified abnormalities of plasma proteins: Secondary | ICD-10-CM

## 2020-04-10 DIAGNOSIS — Z888 Allergy status to other drugs, medicaments and biological substances status: Secondary | ICD-10-CM | POA: Diagnosis not present

## 2020-04-10 DIAGNOSIS — I130 Hypertensive heart and chronic kidney disease with heart failure and stage 1 through stage 4 chronic kidney disease, or unspecified chronic kidney disease: Secondary | ICD-10-CM | POA: Diagnosis not present

## 2020-04-10 DIAGNOSIS — Z87440 Personal history of urinary (tract) infections: Secondary | ICD-10-CM | POA: Diagnosis not present

## 2020-04-10 DIAGNOSIS — M7989 Other specified soft tissue disorders: Secondary | ICD-10-CM | POA: Diagnosis not present

## 2020-04-10 DIAGNOSIS — T451X5A Adverse effect of antineoplastic and immunosuppressive drugs, initial encounter: Secondary | ICD-10-CM | POA: Diagnosis not present

## 2020-04-10 DIAGNOSIS — I5041 Acute combined systolic (congestive) and diastolic (congestive) heart failure: Secondary | ICD-10-CM | POA: Diagnosis not present

## 2020-04-10 DIAGNOSIS — I350 Nonrheumatic aortic (valve) stenosis: Secondary | ICD-10-CM | POA: Diagnosis not present

## 2020-04-10 DIAGNOSIS — D508 Other iron deficiency anemias: Secondary | ICD-10-CM | POA: Diagnosis not present

## 2020-04-10 DIAGNOSIS — M545 Low back pain, unspecified: Secondary | ICD-10-CM | POA: Diagnosis not present

## 2020-04-10 DIAGNOSIS — J9611 Chronic respiratory failure with hypoxia: Secondary | ICD-10-CM | POA: Diagnosis not present

## 2020-04-10 DIAGNOSIS — R5381 Other malaise: Secondary | ICD-10-CM

## 2020-04-10 DIAGNOSIS — I5043 Acute on chronic combined systolic (congestive) and diastolic (congestive) heart failure: Secondary | ICD-10-CM | POA: Diagnosis not present

## 2020-04-10 DIAGNOSIS — E785 Hyperlipidemia, unspecified: Secondary | ICD-10-CM | POA: Diagnosis not present

## 2020-04-10 DIAGNOSIS — Z66 Do not resuscitate: Secondary | ICD-10-CM

## 2020-04-10 DIAGNOSIS — C8330 Diffuse large B-cell lymphoma, unspecified site: Secondary | ICD-10-CM | POA: Diagnosis not present

## 2020-04-10 DIAGNOSIS — R0902 Hypoxemia: Secondary | ICD-10-CM | POA: Diagnosis not present

## 2020-04-10 DIAGNOSIS — Z7401 Bed confinement status: Secondary | ICD-10-CM | POA: Diagnosis not present

## 2020-04-10 DIAGNOSIS — I10 Essential (primary) hypertension: Secondary | ICD-10-CM | POA: Diagnosis not present

## 2020-04-10 DIAGNOSIS — F32A Depression, unspecified: Secondary | ICD-10-CM | POA: Diagnosis not present

## 2020-04-10 DIAGNOSIS — K5900 Constipation, unspecified: Secondary | ICD-10-CM | POA: Diagnosis not present

## 2020-04-10 DIAGNOSIS — Z79899 Other long term (current) drug therapy: Secondary | ICD-10-CM | POA: Diagnosis not present

## 2020-04-10 DIAGNOSIS — K219 Gastro-esophageal reflux disease without esophagitis: Secondary | ICD-10-CM | POA: Diagnosis not present

## 2020-04-10 DIAGNOSIS — M2550 Pain in unspecified joint: Secondary | ICD-10-CM | POA: Diagnosis not present

## 2020-04-10 DIAGNOSIS — M109 Gout, unspecified: Secondary | ICD-10-CM | POA: Diagnosis not present

## 2020-04-10 DIAGNOSIS — N179 Acute kidney failure, unspecified: Secondary | ICD-10-CM | POA: Diagnosis not present

## 2020-04-10 DIAGNOSIS — D649 Anemia, unspecified: Secondary | ICD-10-CM | POA: Diagnosis not present

## 2020-04-10 DIAGNOSIS — N1832 Chronic kidney disease, stage 3b: Secondary | ICD-10-CM | POA: Diagnosis not present

## 2020-04-10 DIAGNOSIS — R531 Weakness: Secondary | ICD-10-CM | POA: Diagnosis not present

## 2020-04-10 DIAGNOSIS — G629 Polyneuropathy, unspecified: Secondary | ICD-10-CM | POA: Diagnosis not present

## 2020-04-10 DIAGNOSIS — M1A39X0 Chronic gout due to renal impairment, multiple sites, without tophus (tophi): Secondary | ICD-10-CM | POA: Diagnosis not present

## 2020-04-10 DIAGNOSIS — C8334 Diffuse large B-cell lymphoma, lymph nodes of axilla and upper limb: Secondary | ICD-10-CM | POA: Diagnosis not present

## 2020-04-10 DIAGNOSIS — C9310 Chronic myelomonocytic leukemia not having achieved remission: Secondary | ICD-10-CM | POA: Diagnosis not present

## 2020-04-10 DIAGNOSIS — D696 Thrombocytopenia, unspecified: Secondary | ICD-10-CM | POA: Diagnosis not present

## 2020-04-10 DIAGNOSIS — E876 Hypokalemia: Secondary | ICD-10-CM | POA: Diagnosis not present

## 2020-04-10 LAB — RETICULOCYTES
Immature Retic Fract: 32.3 % — ABNORMAL HIGH (ref 2.3–15.9)
RBC.: 2.58 MIL/uL — ABNORMAL LOW (ref 4.22–5.81)
Retic Count, Absolute: 128.2 10*3/uL (ref 19.0–186.0)
Retic Ct Pct: 5 % — ABNORMAL HIGH (ref 0.4–3.1)

## 2020-04-10 NOTE — Progress Notes (Signed)
Progress Note  Patient Name: Jeremiah Walker Date of Encounter: 04/10/2020  [facility] Cardiologist: Donato Heinz, MD   Subjective  Plans to go back to ILF at [facility] today  No new labs today Net positive 240 overnight  Inpatient Medications    Scheduled Meds: . vitamin C  1,000 mg Oral Daily  . calcium carbonate  1,250 mg Oral Q breakfast  . cholecalciferol  2,000 Units Oral Daily  . colchicine  0.3 mg Oral BID  . folic acid  2 mg Oral Daily  . furosemide  60 mg Oral Daily  . gabapentin  300 mg Oral BID  . melatonin  10 mg Oral QHS  . metoprolol succinate  25 mg Oral Daily  . zinc sulfate  220 mg Oral Daily   Continuous Infusions:  PRN Meds: acetaminophen **OR** acetaminophen, ALPRAZolam, guaiFENesin-dextromethorphan, HYDROcodone-acetaminophen, ondansetron **OR** ondansetron (ZOFRAN) IV, polyethylene glycol, polyvinyl alcohol   Vital Signs    Vitals:   04/09/20 1023 04/09/20 1941 04/10/20 0509 04/10/20 0739  BP:  (!) 103/50 (!) 96/57   Pulse: 84 90 82   Resp:  18 20 17   Temp:  98.1 F (36.7 C) 98.1 F (36.7 C) (!) 97.4 F (36.3 C)  TempSrc:  Oral Oral Oral  SpO2:  98% 92%   Weight:   77.6 kg   Height:        Intake/Output Summary (Last 24 hours) at 04/10/2020 1114 Last data filed at 04/10/2020 1000 Gross per 24 hour  Intake 462 ml  Output --  Net 462 ml   Last 3 Weights 04/10/2020 04/09/2020 04/07/2020  Weight (lbs) 171 lb 1.2 oz 176 lb 2.4 oz 178 lb 5.6 oz  Weight (kg) 77.6 kg 79.9 kg 80.9 kg      Telemetry     - Personally Reviewed  ECG    No new tracings - Personally Reviewed  Physical Exam   GEN: elderly male, comfortable, asking to go home today  Neck: No JVD Cardiac: RRR, 3/6 systolic murmur Respiratory: crackles in right base (stable) GI: Soft, nontender, non-distended  MS: No edema; No deformity. Wam. Neuro:  Nonfocal  Psych: Normal affect   Labs    High Sensitivity Troponin:   Recent Labs  Lab  04/04/20 1805 04/04/20 1944  TROPONINIHS 302* 301*      Chemistry Recent Labs  Lab 04/04/20 1805 04/05/20 0504 04/06/20 0302 04/06/20 0302 04/07/20 0251 04/08/20 0230 04/09/20 0157  NA 139   < > 137   < > 136 136 137  K 3.9   < > 3.0*   < > 3.5 4.0 3.5  CL 103   < > 93*   < > 93* 93* 94*  CO2 22   < > 30   < > 30 29 30   GLUCOSE 140*   < > 108*   < > 111* 111* 90  BUN 53*   < > 41*   < > 39* 43* 40*  CREATININE 2.57*   < > 1.88*   < > 1.81* 2.03* 1.97*  CALCIUM 9.2   < > 9.4   < > 9.2 9.0 9.0  PROT 6.4*  --  6.7  --   --  6.7  --   ALBUMIN 3.3*  --  3.4*   < > 3.2* 3.3* 3.0*  AST 25  --  28  --   --  25  --   ALT 22  --  20  --   --  20  --   ALKPHOS 63  --  57  --   --  58  --   BILITOT 2.3*  --  3.7*  --   --  2.4*  --   GFRNONAA 21*   < > 31*   < > 32* 28* 29*  ANIONGAP 14   < > 14   < > 13 14 13    < > = values in this interval not displayed.     Hematology Recent Labs  Lab 04/07/20 0251 04/07/20 0251 04/08/20 0230 04/08/20 0231 04/09/20 0157 04/10/20 0202  WBC 7.7  --  7.1  --  6.2  --   RBC 2.59*   < > 2.74*   < > 2.58*  2.55* 2.58*  HGB 9.8*  --  10.2*  --  9.6*  --   HCT 28.8*  --  31.0*  --  29.3*  --   MCV 111.2*  --  113.1*  --  113.6*  --   MCH 37.8*  --  37.2*  --  37.2*  --   MCHC 34.0  --  32.9  --  32.8  --   RDW 26.8*  --  26.7*  --  26.6*  --   PLT 24*  --  27*  --  24*  --    < > = values in this interval not displayed.    BNP Recent Labs  Lab 04/04/20 1805  BNP 1,658.0*     DDimer No results for input(s): DDIMER in the last 168 hours.   Radiology    No results found.  Cardiac Studies   Echo 04/05/20: 1. Left ventricular ejection fraction, by estimation, is 30 to 35%. The  left ventricle has moderately decreased function. The left ventricle  demonstrates global hypokinesis. There is mild concentric left ventricular  hypertrophy. Left ventricular  diastolic parameters are consistent with Grade III diastolic dysfunction   (restrictive). Elevated left atrial pressure. There is severe hypokinesis  of the left ventricular, entire inferior wall and inferolateral wall.  2. Right ventricular systolic function is normal. The right ventricular  size is normal. There is normal pulmonary artery systolic pressure. The  estimated right ventricular systolic pressure is 68.1 mmHg.  3. Left atrial size was severely dilated.  4. The mitral valve is normal in structure. Mild mitral valve  regurgitation. Moderate mitral annular calcification.  5. The aortic valve is tricuspid. There is moderate calcification of the  aortic valve. There is moderate thickening of the aortic valve. Aortic  valve regurgitation is not visualized. Moderate aortic valve stenosis.   Echo 09/26/19: 1. Left ventricular ejection fraction, by estimation, is 55 to 60%. The  left ventricle has normal function. The left ventricle has no regional  wall motion abnormalities. There is mild left ventricular hypertrophy.  Left ventricular diastolic parameters  are consistent with Grade I diastolic dysfunction (impaired relaxation).  Elevated left ventricular end-diastolic pressure.  2. Right ventricular systolic function is normal. The right ventricular  size is normal.  3. Left atrial size was severely dilated.  4. The mitral valve is abnormal. Trivial mitral valve regurgitation. The  mean mitral valve gradient is 3.0 mmHg.  5. The aortic valve is tricuspid. Aortic valve regurgitation is trivial.  Moderate aortic valve stenosis. Aortic valve area, by VTI measures 1.03  cm. Aortic valve mean gradient measures 17.6 mmHg. Aortic valve Vmax  measures 2.83 m/s.  6. The inferior vena cava is normal in size with greater than 50%  respiratory variability, suggesting right atrial pressure of 3 mmHg.    Patient Profile     86 y.o. male with a PMH of chronic diastolic CHF, HEN, DLBCL, CML, CKD III/IV,who is being followed by cardiology for the  evaluation of worsening SOBfound to have newly reduced EF to 30-35%.  Assessment & Plan    Acute systolic and diastolic heart failure Patient with new EF drop to 30-35% with global hypokinesis worse in the inferior and inferolateral myocardium with grade III diastolic dysfunction. Not a cath candidate at this time due to chronic anemia. Pursuing medical management. Net even yesterday. - Appears euvolemic; plans to discharge today - Continue lasix 60mg  PO - Not a candidate for PCI given chronic anemia - Continue metop 25mg  XL - Given renal function, will not start ACEI/ARB/ARNI - No BP room for hydralazine/imdur - Consider repeating an echo as out-patient --> if EF remains low with NSVT, may need to refer to EP to consider ICD - Continue current medications at discharge; will follow-up with Dr. Gardiner Rhyme in clinic  NSVT, PVCs Patient asymptomatic and stable - BB as above  Moderate AS - Will need surveillance echos as out-patient  Elevated troponin Hs troponin 302 --> 301. Suspect demand ischemia. Not a candidate for cath due to chronic anemia and thrombocytopenia. Managing medically. -Continue BB as above -No ASA given anemia and thrombocytopenia  Anemia Thrombocytopenia  - received 2 U PRBC - watch closely as out-patient - no ASA as above  CKD stage III - Baseline 1.8-2. Within baseline yesterday. No new labs today - Renally dose medications - Trend     Okay to discharge home from CV standpoint. Follow-up arranged with cardiology per Dr. Oval Linsey.  BMP in 1 week.   For questions or updates, please contact [place] Please consult [email or web address] for contact info under        Signed, Freada Bergeron, MD  04/10/2020, 11:14 AM

## 2020-04-10 NOTE — TOC Transition Note (Signed)
Transition of Care [facility]) - CM/SW Discharge Note   Patient Details  Name: Jeremiah Walker MRN: [id number] Date of Birth: 04/16/1928  Transition of Care [facility]) CM/SW Contact:  Bary Castilla, LCSW Phone Number: [phone] 04/10/2020, 9:34 AM   Clinical Narrative:    Patient will DC to:?[facility] Anticipated DC date:?04/10/20 Family notified:?[facility] MA:UQJF   Per MD patient ready for DC to [facility]. RN, patient, patient's family, and facility notified of DC. Discharge Summary sent to facility. RN given number for report  [phone] room 7002. DC packet on chart. Ambulance transport requested for patient.   CSW signing off.   [place], [place] [phone]    Final next level of care: Skilled Nursing Facility Barriers to Discharge: No Barriers Identified   Patient Goals and CMS Choice Patient states their goals for this hospitalization and ongoing recovery are:: to go to SNF CMS Medicare.gov Compare Post Acute Care list provided to:: Patient Choice offered to / list presented to : Patient  Discharge Placement              Patient chooses bed at: [facility] Patient to be transferred to facility by: [place] Name of family member notified: Marcello Moores Patient and family notified of of transfer: 04/10/20  Discharge Plan and Services                                     Social Determinants of Health (SDOH) Interventions     Readmission Risk Interventions Readmission Risk Prevention Plan 04/08/2020  Transportation Screening Complete  Some recent data might be hidden

## 2020-04-10 NOTE — Discharge Summary (Signed)
Physician Discharge Summary  Jeremiah Walker WNU:[id number] DOB: [birth year] DOA: 04/04/2020  PCP: [name], MD  Admit date: 04/04/2020 Discharge date: 04/10/2020  Admitted From: ILF at [place] Disposition:  SNF at [facility]  Recommendations for Outpatient Follow-up:  1. Follow ups as below. 2. Please obtain CBC/BMP/Mag at follow up 3. Please follow up on the following pending results: none  Discharge Condition: Stable CODE STATUS: DNR/DNI   Follow-up Information    Jeremiah Heinz, MD Follow up on 04/15/2020.   Specialties: Cardiology, Radiology Why: 11:40 am Contact information: [address] [place] [address] [phone]               Hospital Course: 86 year old male with history of diffuse large cell non-Hodgkin's lymphoma in remission, MDS/CMML with transfusion dependence, combined CHF, CKD-3B and chronic RF on 3 L presenting with the above chief complaints, and admitted for symptomatic anemia, acute on chronic combined CHF,  AKI on CKD-3B and UTI.  Patient was previously on Aranesp and recently switched to [place].   On admission, hemodynamically stable.  Hgb 7.8 (baseline 8-9).  MCV 122.  Plt 24 (41 on 9/30). Cr 2.57 (1.94 on 9/30).  BUN 53 (32 9/30).  Total bili 2.3.  BNP 1658. HS trop 302>301.  Lactic acid 2.1.  UA with small Hgb, trace LE and rare bacteria. CXR with increased vascular congestion with evidence of mild interstitial edema.  G sinus rhythm with nonspecific T wave changes in lateral leads.  COVID-19 and influenza PCR negative.  Cardiology and oncology consulted.   Patient received 1 unit of blood transfusion.  Hemoglobin stable at 9.6 after blood transfusion.  He also received Epogen. Platelets remained low but is stable at 24.  No evidence of bleeding.  Oncology to arrange outpatient follow-up.  In regards to CHF, he was diuresed with IV Lasix.  Renal function, respiratory status and fluid status  improved.   Eventually transitioned to p.o. Lasix. He has been cleared for discharge by cardiology.  Cardiology to arrange outpatient follow-up. He desaturated to 87% on RA requiring 2 L to maintain appropriate saturation with exertion.   Patient was evaluated by therapy who recommended SNF.  See individual problem list below for more hospital course.  Discharge Diagnoses:  Symptomatic anemia of chronic disease/CMML: Hgb 7.8 (admit)>1u?>> 10.1> 9.6.  It seems he has received 1 unit of blood on 10/12 although this was not clearly documented.  Received Epogen on 10/13. -Outpatient follow-up with oncology -Recheck CBC in 1 week  Acute on chronic combined CHF/moderate aortic stenosis: Echo with EF of 30 to 35% (normal LVEF in 09/2019), global hypokinesis, G3-DD and moderate aortic stenosis.  BNP elevated to 1658 on admit.  Personally reviewed CXR consistent with CHF.  Responded to IV Lasix.  Net -3.5 L per chart.  Weight down from 189 pounds to 171 pounds. -P.o. Lasix 60 mg daily per cardiology -GDMT-metoprolol XL 25 mg daily.  No ARB/Entresto or BiDil due to renal function and salts BP. -Monitor fluid status and daily weights -Recheck renal function and electrolytes including magnesium in 1 week  Orthostatic hypotension: Likely due to overdiuresis.    Resolved. -Diuretics and cardiac meds as above  Elevated troponin: Likely demand ischemia in the setting of anemia and CHF. HS trop 302>>301.  EKG sinus rhythm with nonspecific T wave changes in lateral leads.  Patient without chest pain. -Manage anemia and CHF as above.  NSVT: had 10 runs of NSVT the night of 10/13-10/14 -Metoprolol  as above -Optimize electrolytes.  AKI on CKD-3B/azotemia: Cr 2.57>> 1.81> 2.03> 1.97.  BUN 40. -Recheck renal function in 1 week  Pyuria versus UTI: No UTI symptoms.  Urine culture with insignificant growth.  Received IV ceftriaxone x1 in ED.  No indication for antibiotics  Chronic thrombocytopenia likely  due to MDS.   Platelet 24, stable.  No evidence of bleeding. -Outpatient follow-up with oncology -Recheck CBC in 1 week  Chronic respiratory failure: On 3 L at baseline.  Saturating at 99% on room air at rest but desaturated to 87% with ambulation.  Required 2 L to recover. -Continue 2 L by nasal cannula with activity.  Acute gout flareup in right ankle: Uric acid elevated.  Pain resolved. -Continue colchicine -Resume allopurinol in the next 2 to 3 days  Hypokalemia: K3.5.  Mg 2.0. -Recheck in 1 week  Generalized weakness: Uses walker at baseline. -He will continue PT/OT at SNF.   Body mass index is 25.26 kg/m.            Discharge Exam: Vitals:   04/09/20 1941 04/10/20 0509  BP: (!) 103/50 (!) 96/57  Pulse: 90 82  Resp: 18 20  Temp: 98.1 F (36.7 C) 98.1 F (36.7 C)  SpO2: 98% 92%    GENERAL: No apparent distress.  Nontoxic. HEENT: MMM.  Vision and hearing grossly intact.  NECK: Supple.  No apparent JVD.  RESP:  No IWOB.  Fair aeration bilaterally. CVS:  RRR.  3/6 SEM over RUSB and LUSB.  Heart sounds normal.  ABD/GI/GU: Bowel sounds present. Soft. Non tender.  MSK/EXT:  Moves extremities. No apparent deformity. No edema.  SKIN: no apparent skin lesion or wound NEURO: Awake, alert and oriented appropriately.  No apparent focal neuro deficit. PSYCH: Calm. Normal affect.   Discharge Instructions  Discharge Instructions    Diet - low sodium heart healthy   Complete by: As directed    Increase activity slowly   Complete by: As directed      Allergies as of 04/10/2020      Reactions   Nitroglycerin Other (See Comments)   "I ended up in a fetal position from the pain"   Nsaids Other (See Comments), Tinitus   Bruising   Statins Nausea And Vomiting, Other (See Comments)   Kidney issues and muscle pain   Tolmetin Other (See Comments)   Bruising   Ciprofloxacin Other (See Comments)   Achilles tendon issues   Ropinirole Anxiety, Other (See Comments)     Mood swings, also   Requip [ropinirole Hcl] Anxiety      Medication List    STOP taking these medications   baclofen 10 MG tablet Commonly known as: LIORESAL   bethanechol 25 MG tablet Commonly known as: URECHOLINE   HYDROcodone-acetaminophen 7.5-325 MG tablet Commonly known as: NORCO   oxyCODONE-acetaminophen 5-325 MG tablet Commonly known as: PERCOCET/ROXICET     TAKE these medications   acetaminophen 500 MG tablet Commonly known as: TYLENOL Take 1 tablet (500 mg total) by mouth every 8 (eight) hours.   allopurinol 100 MG tablet Commonly known as: ZYLOPRIM TAKE 2 TABLETS(200 MG) BY MOUTH DAILY What changed:   how much to take  how to take this  when to take this  additional instructions   ALPRAZolam 0.5 MG tablet Commonly known as: XANAX TAKE 1 TABLET(0.5 MG) BY MOUTH THREE TIMES DAILY AS NEEDED FOR ANXIETY What changed:   how much to take  how to take this  when to take this  reasons to take this  additional instructions   b complex vitamins tablet Take 1 tablet by mouth daily.   Calcium 500 MG tablet Take 500 mg by mouth daily.   colchicine 0.6 MG tablet Take 0.5 tablets (0.3 mg total) by mouth 2 (two) times daily.   DRY EYES OP Apply 1 drop to eye daily as needed (for dry eyes).   fluorouracil 5 % cream Commonly known as: EFUDEX Apply 1 application topically.   folic acid 1 MG tablet Commonly known as: FOLVITE TAKE 2 TABLETS(2 MG) BY MOUTH DAILY What changed: See the new instructions.   furosemide 20 MG tablet Commonly known as: LASIX Take 3 tablets (60 mg total) by mouth daily. What changed:   medication strength  how much to take  when to take this  reasons to take this   gabapentin 300 MG capsule Commonly known as: NEURONTIN TAKE 1 CAPSULE BY MOUTH THREE TIMES DAILY PLUS 2 CAPSULES AS NEEDED What changed:   how much to take  how to take this  when to take this  additional instructions   Melatonin 10 MG  Tabs Take 20 mg by mouth at bedtime.   metoprolol succinate 25 MG 24 hr tablet Commonly known as: TOPROL-XL Take 1 tablet (25 mg total) by mouth daily.   polyethylene glycol 17 g packet Commonly known as: MIRALAX / GLYCOLAX Take 17 g by mouth daily as needed for mild constipation.   Potassium Chloride ER 20 MEQ Tbcr Take 20 mEq by mouth daily.   Systane Ultra PF 0.4-0.3 % Soln Generic drug: Polyethyl Glyc-Propyl Glyc PF Place 1-2 drops into both eyes 3 (three) times daily as needed (for dryness).   VITAMIN B-12 PO Take 1 tablet by mouth daily after breakfast.   VITAMIN B-6 PO Take 1 tablet by mouth daily after breakfast.   vitamin C 500 MG tablet Commonly known as: ASCORBIC ACID Take 1,000 mg by mouth daily.   Vitamin D3 50 MCG (2000 UT) capsule Take 2,000 Units by mouth daily.   zinc gluconate 50 MG tablet Take 50 mg by mouth daily.       Consultations:  Oncology  Cardiology  Procedures/Studies: 1. Left ventricular ejection fraction, by estimation, is 30 to 35%. The  left ventricle has moderately decreased function. The left ventricle  demonstrates global hypokinesis. There is mild concentric left ventricular  hypertrophy. Left ventricular  diastolic parameters are consistent with Grade III diastolic dysfunction  (restrictive). Elevated left atrial pressure. There is severe hypokinesis  of the left ventricular, entire inferior wall and inferolateral wall.  2. Right ventricular systolic function is normal. The right ventricular  size is normal. There is normal pulmonary artery systolic pressure. The  estimated right ventricular systolic pressure is 83.4 mmHg.  3. Left atrial size was severely dilated.  4. The mitral valve is normal in structure. Mild mitral valve  regurgitation. Moderate mitral annular calcification.  5. The aortic valve is tricuspid. There is moderate calcification of the  aortic valve. There is moderate thickening of the aortic valve.  Aortic  valve regurgitation is not visualized. Moderate aortic valve stenosis.    DG Chest Port 1 View  Result Date: 04/04/2020 CLINICAL DATA:  Shortness of breath EXAM: PORTABLE CHEST 1 VIEW COMPARISON:  12/04/2019 FINDINGS: Cardiac shadow is enlarged but stable. Aortic calcifications are again seen. Mild vascular congestion and interstitial edema is seen particularly in the right lung. No sizable effusion is noted. No bony abnormality is seen. Old left clavicular fracture  with healing is noted. IMPRESSION: Increased vascular congestion with evidence of mild interstitial edema. Electronically Signed   By: Inez Catalina M.D.   On: 04/04/2020 18:45   ECHOCARDIOGRAM COMPLETE  Result Date: 04/05/2020    ECHOCARDIOGRAM REPORT   Patient Name:   Jeremiah Walker Date of Exam: 04/05/2020 Medical Rec #:  [id number]        Height:       70.0 in Accession #:    [id number]       Weight:       190.0 lb Date of Birth:  01-Jan-1928        BSA:          2.042 m Patient Age:    86 years         BP:           110/71 mmHg Patient Gender: M                HR:           88 bpm. Exam Location:  Inpatient Procedure: 2D Echo, Cardiac Doppler, Color Doppler and Intracardiac            Opacification Agent Indications:    CHF  History:        Patient has prior history of Echocardiogram examinations, most                 recent 09/26/2019. CHF, Signs/Symptoms:Shortness of Breath; Risk                 Factors:Hypertension. Lymphoma, CKD.  Sonographer:    Dustin Flock Referring Phys: [phone] JARED M GARDNER  Sonographer Comments: Image acquisition challenging due to uncooperative patient. IMPRESSIONS  1. Left ventricular ejection fraction, by estimation, is 30 to 35%. The left ventricle has moderately decreased function. The left ventricle demonstrates global hypokinesis. There is mild concentric left ventricular hypertrophy. Left ventricular diastolic parameters are consistent with Grade III diastolic dysfunction (restrictive).  Elevated left atrial pressure. There is severe hypokinesis of the left ventricular, entire inferior wall and inferolateral wall.  2. Right ventricular systolic function is normal. The right ventricular size is normal. There is normal pulmonary artery systolic pressure. The estimated right ventricular systolic pressure is 01.0 mmHg.  3. Left atrial size was severely dilated.  4. The mitral valve is normal in structure. Mild mitral valve regurgitation. Moderate mitral annular calcification.  5. The aortic valve is tricuspid. There is moderate calcification of the aortic valve. There is moderate thickening of the aortic valve. Aortic valve regurgitation is not visualized. Moderate aortic valve stenosis. Comparison(s): The left ventricular function is significantly worse. The left ventricular diastolic function is significantly worse. The left ventricular wall motion is new. Aortic valve gradients are underestimated due topoor left ventricular function, but aortic stenosis is probably moderate. Aortic cusp mobility is only moderately restricted. FINDINGS  Left Ventricle: Left ventricular ejection fraction, by estimation, is 30 to 35%. The left ventricle has moderately decreased function. The left ventricle demonstrates global hypokinesis. Severe hypokinesis of the left ventricular, entire inferior wall and inferolateral wall. Definity contrast agent was given IV to delineate the left ventricular endocardial borders. The left ventricular internal cavity size was normal in size. There is mild concentric left ventricular hypertrophy. Left ventricular diastolic parameters are consistent with Grade III diastolic dysfunction (restrictive). Elevated left atrial pressure. Right Ventricle: The right ventricular size is normal. No increase in right ventricular wall thickness. Right ventricular systolic function is normal. There is normal pulmonary  artery systolic pressure. The tricuspid regurgitant velocity is 2.35 m/s, and  with  an assumed right atrial pressure of 3 mmHg, the estimated right ventricular systolic pressure is 78.4 mmHg. Left Atrium: Left atrial size was severely dilated. Right Atrium: Right atrial size was normal in size. Pericardium: There is no evidence of pericardial effusion. Mitral Valve: The mitral valve is normal in structure. There is moderate thickening of the mitral valve leaflet(s). There is mild calcification of the mitral valve leaflet(s). Moderate mitral annular calcification. Mild mitral valve regurgitation, with centrally-directed jet. Tricuspid Valve: The tricuspid valve is normal in structure. Tricuspid valve regurgitation is trivial. Aortic Valve: The aortic valve is tricuspid. There is moderate calcification of the aortic valve. There is moderate thickening of the aortic valve. Aortic valve regurgitation is not visualized. Moderate aortic stenosis is present. Aortic valve mean gradient measures 18.0 mmHg. Aortic valve peak gradient measures 32.5 mmHg. Aortic valve area, by VTI measures 0.96 cm. Pulmonic Valve: The pulmonic valve was not well visualized. Pulmonic valve regurgitation is not visualized. Aorta: The aortic root is normal in size and structure. IAS/Shunts: No atrial level shunt detected by color flow Doppler.  LEFT VENTRICLE PLAX 2D LVIDd:         4.00 cm  Diastology LVIDs:         3.30 cm  LV e' medial:    3.59 cm/s LV PW:         1.30 cm  LV E/e' medial:  32.9 LV IVS:        1.35 cm  LV e' lateral:   6.96 cm/s LVOT diam:     2.30 cm  LV E/e' lateral: 17.0 LV SV:         60 LV SV Index:   29 LVOT Area:     4.15 cm  RIGHT VENTRICLE RV Basal diam:  3.30 cm RV S prime:     10.80 cm/s TAPSE (M-mode): 2.2 cm LEFT ATRIUM             Index       RIGHT ATRIUM           Index LA diam:        4.70 cm 2.30 cm/m  RA Area:     13.70 cm LA Vol (A2C):   35.8 ml 17.53 ml/m RA Volume:   28.90 ml  14.15 ml/m LA Vol (A4C):   89.9 ml 44.02 ml/m LA Biplane Vol: 61.6 ml 30.16 ml/m  AORTIC VALVE AV Area  (Vmax):    1.03 cm AV Area (Vmean):   0.92 cm AV Area (VTI):     0.96 cm AV Vmax:           285.00 cm/s AV Vmean:          190.000 cm/s AV VTI:            0.626 m AV Peak Grad:      32.5 mmHg AV Mean Grad:      18.0 mmHg LVOT Vmax:         70.80 cm/s LVOT Vmean:        42.300 cm/s LVOT VTI:          0.144 m LVOT/AV VTI ratio: 0.23  AORTA Ao Root diam: 2.90 cm MITRAL VALVE                TRICUSPID VALVE MV Area (PHT): 6.32 cm     TR Peak grad:   22.1 mmHg MV Decel Time: 120  msec     TR Vmax:        235.00 cm/s MV E velocity: 118.00 cm/s MV A velocity: 35.00 cm/s   SHUNTS MV E/A ratio:  3.37         Systemic VTI:  0.14 m                             Systemic Diam: 2.30 cm Dani Gobble Croitoru MD Electronically signed by Sanda Klein MD Signature Date/Time: 04/05/2020/10:57:14 AM    Final         The results of significant diagnostics from this hospitalization (including imaging, microbiology, ancillary and laboratory) are listed below for reference.     Microbiology: Recent Results (from the past 240 hour(s))  Respiratory Panel by RT PCR (Flu A&B, Covid) - Nasopharyngeal Swab     Status: None   Collection Time: 04/04/20  6:11 PM   Specimen: Nasopharyngeal Swab  Result Value Ref Range Status   SARS Coronavirus 2 by RT PCR NEGATIVE NEGATIVE Final    Comment: (NOTE) SARS-CoV-2 target nucleic acids are NOT DETECTED.  The SARS-CoV-2 RNA is generally detectable in upper respiratoy specimens during the acute phase of infection. The lowest concentration of SARS-CoV-2 viral copies this assay can detect is 131 copies/mL. A negative result does not preclude SARS-Cov-2 infection and should not be used as the sole basis for treatment or other patient management decisions. A negative result may occur with  improper specimen collection/handling, submission of specimen other than nasopharyngeal swab, presence of viral mutation(s) within the areas targeted by this assay, and inadequate number of viral  copies (<131 copies/mL). A negative result must be combined with clinical observations, patient history, and epidemiological information. The expected result is Negative.  Fact Sheet for Patients:  PinkCheek.be  Fact Sheet for Healthcare Providers:  GravelBags.it  This test is no t yet approved or cleared by the [place] FDA and  has been authorized for detection and/or diagnosis of SARS-CoV-2 by FDA under an Emergency Use Authorization (EUA). This EUA will remain  in effect (meaning this test can be used) for the duration of the COVID-19 declaration under Section 564(b)(1) of the Act, 21 U.S.C. section 360bbb-3(b)(1), unless the authorization is terminated or revoked sooner.     Influenza A by PCR NEGATIVE NEGATIVE Final   Influenza B by PCR NEGATIVE NEGATIVE Final    Comment: (NOTE) The Xpert Xpress SARS-CoV-2/FLU/RSV assay is intended as an aid in  the diagnosis of influenza from Nasopharyngeal swab specimens and  should not be used as a sole basis for treatment. Nasal washings and  aspirates are unacceptable for Xpert Xpress SARS-CoV-2/FLU/RSV  testing.  Fact Sheet for Patients: PinkCheek.be  Fact Sheet for Healthcare Providers: GravelBags.it  This test is not yet approved or cleared by the [place] FDA and  has been authorized for detection and/or diagnosis of SARS-CoV-2 by  FDA under an Emergency Use Authorization (EUA). This EUA will remain  in effect (meaning this test can be used) for the duration of the  Covid-19 declaration under Section 564(b)(1) of the Act, 21  U.S.C. section 360bbb-3(b)(1), unless the authorization is  terminated or revoked. Performed at [facility], [place] [address], [place], [place] [address]   Urine culture     Status: Abnormal   Collection Time: 04/04/20  7:20 PM   Specimen: Urine, Random  Result Value Ref  Range Status   Specimen Description URINE, RANDOM  Final   Special Requests NONE  Final   Culture (A)  Final    <10,000 COLONIES/mL INSIGNIFICANT GROWTH Performed at [facility], [place] [address], [place], [place] [address]    Report Status 04/05/2020 FINAL  Final  SARS Coronavirus 2 by RT PCR (hospital order, performed in [facility] hospital lab) Nasopharyngeal Nasopharyngeal Swab     Status: None   Collection Time: 04/07/20  4:12 AM   Specimen: Nasopharyngeal Swab  Result Value Ref Range Status   SARS Coronavirus 2 NEGATIVE NEGATIVE Final    Comment: (NOTE) SARS-CoV-2 target nucleic acids are NOT DETECTED.  The SARS-CoV-2 RNA is generally detectable in upper and lower respiratory specimens during the acute phase of infection. The lowest concentration of SARS-CoV-2 viral copies this assay can detect is 250 copies / mL. A negative result does not preclude SARS-CoV-2 infection and should not be used as the sole basis for treatment or other patient management decisions.  A negative result may occur with improper specimen collection / handling, submission of specimen other than nasopharyngeal swab, presence of viral mutation(s) within the areas targeted by this assay, and inadequate number of viral copies (<250 copies / mL). A negative result must be combined with clinical observations, patient history, and epidemiological information.  Fact Sheet for Patients:   StrictlyIdeas.no  Fact Sheet for Healthcare Providers: BankingDealers.co.za  This test is not yet approved or  cleared by the [place] FDA and has been authorized for detection and/or diagnosis of SARS-CoV-2 by FDA under an Emergency Use Authorization (EUA).  This EUA will remain in effect (meaning this test can be used) for the duration of the COVID-19 declaration under Section 564(b)(1) of the Act, 21 U.S.C. section 360bbb-3(b)(1), unless the authorization is  terminated or revoked sooner.  Performed at [facility], [place] [address], [place], [place] [address]      Labs: BNP (last 3 results) Recent Labs    10/06/19 1240 12/08/19 1020 04/04/20 1805  BNP 319.0* 730.2* 8,453.6*   Basic Metabolic Panel: Recent Labs  Lab 04/04/20 1805 04/04/20 1805 04/05/20 0504 04/06/20 0302 04/06/20 1416 04/07/20 0251 04/08/20 0230 04/09/20 0157  NA 139   < > 141 137  --  136 136 137  K 3.9   < > 3.7 3.0*  --  3.5 4.0 3.5  CL 103   < > 100 93*  --  93* 93* 94*  CO2 22   < > 27 30  --  30 29 30   GLUCOSE 140*   < > 109* 108*  --  111* 111* 90  BUN 53*   < > 47* 41*  --  39* 43* 40*  CREATININE 2.57*   < > 2.11* 1.88*  --  1.81* 2.03* 1.97*  CALCIUM 9.2   < > 9.5 9.4  --  9.2 9.0 9.0  MG 2.1  --   --   --  1.6* 2.1 2.0 2.0  PHOS  --   --   --   --   --  3.2 3.3 4.1   < > = values in this interval not displayed.   Liver Function Tests: Recent Labs  Lab 04/04/20 1805 04/06/20 0302 04/07/20 0251 04/08/20 0230 04/09/20 0157  AST 25 28  --  25  --   ALT 22 20  --  20  --   ALKPHOS 63 57  --  58  --   BILITOT 2.3* 3.7*  --  2.4*  --  PROT 6.4* 6.7  --  6.7  --   ALBUMIN 3.3* 3.4* 3.2* 3.3* 3.0*   No results for input(s): LIPASE, AMYLASE in the last 168 hours. No results for input(s): AMMONIA in the last 168 hours. CBC: Recent Labs  Lab 04/04/20 1805 04/04/20 1805 04/05/20 0504 04/06/20 0302 04/07/20 0251 04/08/20 0230 04/09/20 0157  WBC 7.3   < > 8.2 7.8 7.7 7.1 6.2  NEUTROABS 3.5  --   --   --   --  3.2  --   HGB 7.8*   < > 8.8* 10.1* 9.8* 10.2* 9.6*  HCT 24.6*   < > 27.2* 31.2* 28.8* 31.0* 29.3*  MCV 122.4*   < > 111.9* 113.5* 111.2* 113.1* 113.6*  PLT 24*   < > 27* 24* 24* 27* 24*   < > = values in this interval not displayed.   Cardiac Enzymes: No results for input(s): CKTOTAL, CKMB, CKMBINDEX, TROPONINI in the last 168 hours. BNP: Invalid input(s): POCBNP CBG: No results for input(s): GLUCAP in the last 168  hours. D-Dimer No results for input(s): DDIMER in the last 72 hours. Hgb A1c No results for input(s): HGBA1C in the last 72 hours. Lipid Profile No results for input(s): CHOL, HDL, LDLCALC, TRIG, CHOLHDL, LDLDIRECT in the last 72 hours. Thyroid function studies No results for input(s): TSH, T4TOTAL, T3FREE, THYROIDAB in the last 72 hours.  Invalid input(s): FREET3 Anemia work up Recent Labs    04/09/20 0157 04/10/20 0202  RETICCTPCT 5.0* 5.0*   Urinalysis    Component Value Date/Time   COLORURINE YELLOW 04/04/2020 [place] 04/04/2020 1823   LABSPEC 1.017 04/04/2020 1823   PHURINE 5.0 04/04/2020 1823   GLUCOSEU NEGATIVE 04/04/2020 1823   HGBUR SMALL (A) 04/04/2020 1823   BILIRUBINUR NEGATIVE 04/04/2020 1823   BILIRUBINUR negative 12/11/2017 [place] 04/04/2020 1823   PROTEINUR 30 (A) 04/04/2020 1823   UROBILINOGEN 1.0 12/11/2017 1507   NITRITE NEGATIVE 04/04/2020 1823   LEUKOCYTESUR TRACE (A) 04/04/2020 1823   Sepsis Labs Invalid input(s): PROCALCITONIN,  WBC,  LACTICIDVEN   Time coordinating discharge: 45 minutes  SIGNED:  Mercy Riding, MD  [facility] 04/10/2020, 7:05 AM  If 7PM-7AM, please contact night-coverage [email or web address]

## 2020-04-10 NOTE — Progress Notes (Signed)
Attempted to call report to [facility] x 2. RN left message and will continue to attempt.

## 2020-04-11 DIAGNOSIS — R531 Weakness: Secondary | ICD-10-CM | POA: Diagnosis not present

## 2020-04-11 DIAGNOSIS — F419 Anxiety disorder, unspecified: Secondary | ICD-10-CM | POA: Diagnosis not present

## 2020-04-11 DIAGNOSIS — N1832 Chronic kidney disease, stage 3b: Secondary | ICD-10-CM | POA: Diagnosis not present

## 2020-04-11 DIAGNOSIS — D649 Anemia, unspecified: Secondary | ICD-10-CM | POA: Diagnosis not present

## 2020-04-11 DIAGNOSIS — I5043 Acute on chronic combined systolic (congestive) and diastolic (congestive) heart failure: Secondary | ICD-10-CM | POA: Diagnosis not present

## 2020-04-11 DIAGNOSIS — M1A39X0 Chronic gout due to renal impairment, multiple sites, without tophus (tophi): Secondary | ICD-10-CM | POA: Diagnosis not present

## 2020-04-11 NOTE — Progress Notes (Signed)
Cardiology Office Note:    Date:  04/15/2020   ID:  Jeremiah Walker, DOB [birth year], MRN [id number]  PCP:  [name], MD  Cardiologist:  Donato Heinz, MD  Electrophysiologist:  None   Referring MD: [name], MD   Chief Complaint  Patient presents with  . Congestive Heart Failure    History of Present Illness:    Jeremiah Walker is a 86 y.o. male with a hx of CML, CKD stage III, hypertension who presents for follow-up.  He was admitted to [facility] for chest pain on 03/26/2019.  He reported that he had been doing water aerobics and while he had no chest pain while exercising, that evening he was doing stretches on the floor and began having substernal chest pain.  Pain lasted from 9 PM that night until 7 AM the following morning.  EKG showed no acute ST/T abnormalities.  Labs showed mild elevation in high-sensitivity troponin (56->66).  He had a CTPA which showed no evidence of PE but did note coronary artery atherosclerosis.  Nuclear stress test was done, which showed no ischemia but did show a large fixed defect suggestive of prior infarct.  TTE was done which showed normal ejection fraction, suggesting perfusion defect on stress test was likely artifact.  TTE was also notable for moderate aortic stenosis.    He was admitted to [facility] from 4/2 through 09/27/2019 with progressive dyspnea.  CTA chest showed moderate bilateral pleural effusions.  BNP 722 on 4/2.  TTE showed LVEF 55 to 78%, grade 1 diastolic dysfunction, normal RV systolic function, moderate aortic stenosis.  He was admitted for CHF exacerbation and diuresed with IV Lasix, with significant provement in his symptoms.  He was discharged on as needed Lasix.  He was readmitted to [facility] from 10/11 through 04/10/2020.  Presented with worsening dyspnea.  Echocardiogram on 04/05/2020 showed EF 30 to 35% (global hypokinesis), grade 3 diastolic dysfunction, normal RV function, moderate aortic stenosis.  No catheterization  was done given his chronic anemia/thrombocytopenia.  He was discharged on Lasix 60 mg daily and Toprol-XL 25 mg daily.  Since discharge from hospital, reports she has been doing well.  Weight is down 14 pounds from prior clinic visit.  Taking Lasix 60 mg daily.  Reports dyspnea is significantly improved.  Denies any chest pain.  Reports having some joint pain but otherwise no complaints.  Doing physical therapy regularly.  Denies any lightheadedness or syncope.   Past Medical History:  Diagnosis Date  . Acute exacerbation of CHF (congestive heart failure) ([place]) 09/25/2019  . Anemia   . Anemia due to antineoplastic chemotherapy 08/15/2015  . Anxiety   . Arthritis   . Cataract   . Chronic kidney disease (CKD), stage III (moderate) (HCC) 02/09/2015   Controlled  . CMML (chronic myelomonocytic leukemia) ([place])    gets Aranesp about monthly, otherwise surveillance with Dr. Marin Olp  . Combined fat and carbohydrate induced hyperlipemia 02/09/2015   Controlled  . Depression   . DLBCL (diffuse large B cell lymphoma) ([place]) 08/15/2015  . Essential (primary) hypertension 02/09/2015  . Gastro-esophageal reflux disease without esophagitis 02/09/2015   Controlled  . Renal insufficiency   . Tinnitus     Past Surgical History:  Procedure Laterality Date  . CATARACT EXTRACTION, BILATERAL    . COLONOSCOPY  May 2011  . CYSTOSCOPY    . TOTAL HIP ARTHROPLASTY  2003   Right   . TOTAL HIP ARTHROPLASTY  April, 2011   Left  .  TRIGGER FINGER RELEASE  Nov 2013  . TURBINECTOMY  2006  . UPPER GI ENDOSCOPY  Nov 2015    Current Medications: Current Meds  Medication Sig  . acetaminophen (TYLENOL) 500 MG tablet Take 1 tablet (500 mg total) by mouth every 8 (eight) hours.  Marland Kitchen allopurinol (ZYLOPRIM) 100 MG tablet TAKE 2 TABLETS(200 MG) BY MOUTH DAILY (Patient taking differently: Take 200 mg by mouth daily with breakfast. )  . ALPRAZolam (XANAX) 0.5 MG tablet TAKE 1 TABLET(0.5 MG) BY MOUTH THREE TIMES DAILY AS  NEEDED FOR ANXIETY (Patient taking differently: Take 0.5 mg by mouth 3 (three) times daily as needed for anxiety. )  . Artificial Tear Ointment (DRY EYES OP) Apply 1 drop to eye daily as needed (for dry eyes).  Marland Kitchen b complex vitamins tablet Take 1 tablet by mouth daily.  . Calcium 500 MG tablet Take 500 mg by mouth daily.   . Cholecalciferol (VITAMIN D3) 2000 UNITS capsule Take 2,000 Units by mouth daily.  . colchicine 0.6 MG tablet Take 0.5 tablets (0.3 mg total) by mouth 2 (two) times daily.  . Cyanocobalamin (VITAMIN B-12 PO) Take 1 tablet by mouth daily after breakfast.  . fluorouracil (EFUDEX) 5 % cream Apply 1 application topically.   . folic acid (FOLVITE) 1 MG tablet TAKE 2 TABLETS(2 MG) BY MOUTH DAILY (Patient taking differently: Take 1 mg by mouth daily. )  . furosemide (LASIX) 20 MG tablet Take 3 tablets (60 mg total) by mouth daily.  Marland Kitchen gabapentin (NEURONTIN) 300 MG capsule TAKE 1 CAPSULE BY MOUTH THREE TIMES DAILY PLUS 2 CAPSULES AS NEEDED (Patient taking differently: Take 300 mg by mouth 3 (three) times daily. )  . Melatonin 10 MG TABS Take 20 mg by mouth at bedtime.   . metoprolol succinate (TOPROL-XL) 25 MG 24 hr tablet Take 1 tablet (25 mg total) by mouth daily.  Vladimir Faster Glyc-Propyl Glyc PF (SYSTANE ULTRA PF) 0.4-0.3 % SOLN Place 1-2 drops into both eyes 3 (three) times daily as needed (for dryness).   . polyethylene glycol (MIRALAX / GLYCOLAX) packet Take 17 g by mouth daily as needed for mild constipation.   . Potassium Chloride ER 20 MEQ TBCR Take 20 mEq by mouth daily.  . Pyridoxine HCl (VITAMIN B-6 PO) Take 1 tablet by mouth daily after breakfast.  . vitamin C (ASCORBIC ACID) 500 MG tablet Take 1,000 mg by mouth daily.   Marland Kitchen zinc gluconate 50 MG tablet Take 50 mg by mouth daily.     Allergies:   Nitroglycerin, Nsaids, Statins, Tolmetin, Ciprofloxacin, Ropinirole, and Requip [ropinirole hcl]   Social History   Socioeconomic History  . Marital status: Widowed    Spouse  name: Not on file  . Number of children: Not on file  . Years of education: Not on file  . Highest education level: Not on file  Occupational History  . Not on file  Tobacco Use  . Smoking status: Former Research scientist (life sciences)  . Smokeless tobacco: Never Used  . Tobacco comment: Quit >50 years ago  Vaping Use  . Vaping Use: Never used  Substance and Sexual Activity  . Alcohol use: Yes    Alcohol/week: 1.0 - 4.0 standard drink    Types: 1 - 4 Shots of liquor per week    Comment: 4 glasses per week  . Drug use: No  . Sexual activity: Not on file  Other Topics Concern  . Not on file  Social History Narrative  . Not on file  Social Determinants of Health   Financial Resource Strain: Low Risk   . Difficulty of Paying Living Expenses: Not hard at all  Food Insecurity: No Food Insecurity  . Worried About Charity fundraiser in the Last Year: Never true  . Ran Out of Food in the Last Year: Never true  Transportation Needs: No Transportation Needs  . Lack of Transportation (Medical): No  . Lack of Transportation (Non-Medical): No  Physical Activity:   . Days of Exercise per Week: Not on file  . Minutes of Exercise per Session: Not on file  Stress:   . Feeling of Stress : Not on file  Social Connections:   . Frequency of Communication with Friends and Family: Not on file  . Frequency of Social Gatherings with Friends and Family: Not on file  . Attends Religious Services: Not on file  . Active Member of Clubs or Organizations: Not on file  . Attends Archivist Meetings: Not on file  . Marital Status: Not on file     Family History: The patient's family history includes Alzheimer's disease in his brother and paternal grandmother; Anuerysm (age of onset: 43) in his mother; Colon cancer in his brother; Colon cancer (age of onset: 70) in his father; Early death in his maternal grandmother; Healthy in his daughter and son; Heart disease in his brother; Obesity in his son and son; Prostate  cancer in his brother. There is no history of Diabetes.  ROS:   Please see the history of present illness.     All other systems reviewed and are negative.  EKGs/Labs/Other Studies Reviewed:    The following studies were reviewed today:   EKG:  EKG is ordered today and shows normal sinus rhythm, rate 79,  PVC, LVH with repolarization  Stress MPI 03/26/19:  There was no ST segment deviation noted during stress.  No T wave inversion was noted during stress.  Defect 1: There is a large defect of moderate severity present in the basal anteroseptal, basal inferoseptal, basal inferior, basal inferolateral, basal anterolateral, mid inferoseptal, mid inferior, mid inferolateral, apical inferior and apical lateral location.  Findings consistent with prior myocardial infarction.  The left ventricular ejection fraction is mildly decreased (45-54%).  This is a high risk study.  No prior for comparison.   Large, severe fixed defect primarily in inferolateral/inferior walls and extending to adjacent walls, especially at base. Reduced EF with matching wall motion abnormalities. Consistent with infarct. No reversible ischemia seen.  TTE 03/27/19:  1. Left ventricular ejection fraction, by visual estimation, is 60 to 65%. The left ventricle has normal function. There is moderately increased left ventricular hypertrophy.  2. Elevated left atrial and left ventricular end-diastolic pressures.  3. Left ventricular diastolic Doppler parameters are consistent with impaired relaxation pattern of LV diastolic filling.  4. Global right ventricle has normal systolic function.The right ventricular size is normal. No increase in right ventricular wall thickness.  5. Left atrial size was moderately dilated.  6. Right atrial size was normal.  7. Moderate mitral annular calcification.  8. The mitral valve is abnormal. Mild mitral valve regurgitation.  9. The tricuspid valve is grossly normal. Tricuspid valve  regurgitation is trivial. 10. The aortic valve is tricuspid Aortic valve regurgitation was not visualized by color flow Doppler. Moderate aortic valve stenosis. 11. The pulmonic valve was grossly normal. Pulmonic valve regurgitation is trivial by color flow Doppler.   Recent Labs: 12/02/2019: Pro B Natriuretic peptide (BNP) 1,021.0 03/02/2020: TSH 1.547  04/04/2020: B Natriuretic Peptide 1,658.0 04/09/2020: Magnesium 2.0 04/14/2020: ALT 16; BUN 49; Creatinine 2.05; Hemoglobin 9.8; Platelet Count 46; Potassium 4.0; Sodium 142  Recent Lipid Panel    Component Value Date/Time   CHOL 141 03/26/2019 0055   TRIG 69 03/26/2019 0055   HDL 41 03/26/2019 0055   CHOLHDL 3.4 03/26/2019 0055   VLDL 14 03/26/2019 0055   LDLCALC 86 03/26/2019 0055    Physical Exam:    VS:  BP 100/61   Pulse 79   Temp (!) 97 F (36.1 C)   Ht 5\' 10"  (1.778 m)   Wt 176 lb 6.4 oz (80 kg)   SpO2 94%   BMI 25.31 kg/m     Wt Readings from Last 3 Encounters:  04/15/20 176 lb 6.4 oz (80 kg)  04/14/20 173 lb 1.9 oz (78.5 kg)  04/10/20 171 lb 1.2 oz (77.6 kg)  SpO2 91% on recheck.  GEN:  in no acute distress HEENT: Normal NECK: no JVD LYMPHATICS: No lymphadenopathy CARDIAC: RRR, 3/6 systolic murmur loudest at RUSB RESPIRATORY:CTAB ABDOMEN: Soft, non-tender, non-distended MUSCULOSKELETAL: trace edema SKIN: Warm and dry NEUROLOGIC:  Alert and oriented x 3 PSYCHIATRIC:  Normal affect   ASSESSMENT:    1. Acute on chronic combined systolic and diastolic CHF (congestive heart failure) ([place])   2. Aortic stenosis, moderate   3. Essential hypertension   4. Chronic renal failure, stage 3b (HCC)    PLAN:    Acute on chronic combined systolic and diastolic heart failure: Admitted last week with progressive dyspnea, found to have new systolic heart failure.  Echocardiogram on 04/05/2020 showed EF 30 to 35% (global hypokinesis), grade 3 diastolic dysfunction, normal RV function, moderate aortic stenosis.  No cath  was done due to chronic anemia/thrombocytopenia -Continue Lasix 60 mg daily.  Stable kidney function/electrolytes on labs drawn yesterday.  Recommend checking magnesium on next lab draw with his hematologist.  Recommended daily weights at his facility and call if gaining more than 3 pounds in 1 day or 5 pounds in 1 week. -Continue Toprol-XL 25 mg daily -Not on ACE/ARB/Arni due to renal function and soft BP  Aortic stenosis: moderate AS on echo 03/2020, will follow with annual echo  Chest pain:atypical symptoms.  No recent chest pain.  Stress MPI 03/26/2019 shows fixed defect, mild systolic dysfunction.  Now with new systolic dysfunction, but not candidate for cath as above.      NFA:OZHYQMVH Toprol-XL 25 mg daily.  HLD: developed rhabo while on statin therapy in the past. LDL 85 without current therapy.   QIO:NGEX anemia and thrombocytopenia. Followed by Dr. Marin Olp through the [place] [facility]. On aranesp.  Recently started on Luspatercept for his anemia  CKD III-IV:Cr 1.97 on 04/09/2020, stable yesterday at 2.05  RTC in 1 month  Medication Adjustments/Labs and Tests Ordered: Current medicines are reviewed at length with the patient today.  Concerns regarding medicines are outlined above.  Orders Placed This Encounter  Procedures  . Magnesium  . EKG 12-Lead   No orders of the defined types were placed in this encounter.   Patient Instructions  Medication Instructions:  Your physician recommends that you continue on your current medications as directed. Please refer to the Current Medication list given to you today.  *If you need a refill on your cardiac medications before your next appointment, please call your pharmacy*   Lab Work: Magnesium with your next lab draw  If you have labs (blood work) drawn today and your tests are completely normal,  you will receive your results only by: Marland Kitchen MyChart Message (if you have MyChart) OR . A paper copy in the mail If you have any lab  test that is abnormal or we need to change your treatment, we will call you to review the results.  Follow-Up: At [facility], you and your health needs are our priority.  As part of our continuing mission to provide you with exceptional heart care, we have created designated Provider Care Teams.  These Care Teams include your primary Cardiologist (physician) and Advanced Practice Providers (APPs -  Physician Assistants and Nurse Practitioners) who all work together to provide you with the care you need, when you need it.  We recommend signing up for the patient portal called "MyChart".  Sign up information is provided on this After Visit Summary.  MyChart is used to connect with patients for Virtual Visits (Telemedicine).  Patients are able to view lab/test results, encounter notes, upcoming appointments, etc.  Non-urgent messages can be sent to your provider as well.   To learn more about what you can do with MyChart, go to [email or web address].    Your next appointment:   1 month(s)  The format for your next appointment:   In Person  Provider:   Oswaldo Milian, MD   Other Instructions Please weight daily and have facility staff call if you gain 3 lbs overnight or 5 lbs in 1 week.     Signed, Donato Heinz, MD  04/15/2020 6:03 PM    [place]

## 2020-04-13 ENCOUNTER — Telehealth: Payer: Self-pay | Admitting: Family Medicine

## 2020-04-13 NOTE — Telephone Encounter (Signed)
Patient states d/c from [facility] and went to [place]  for care, Patient is concerned about his portable oxygen order. Patient would like to know if Dr. Lorelei Pont ever place orders for oxygen.  Please Advise

## 2020-04-14 ENCOUNTER — Encounter: Payer: Self-pay | Admitting: Family Medicine

## 2020-04-14 ENCOUNTER — Other Ambulatory Visit: Payer: Self-pay

## 2020-04-14 ENCOUNTER — Inpatient Hospital Stay (HOSPITAL_BASED_OUTPATIENT_CLINIC_OR_DEPARTMENT_OTHER): Payer: No Typology Code available for payment source | Admitting: Family

## 2020-04-14 ENCOUNTER — Inpatient Hospital Stay: Payer: No Typology Code available for payment source | Attending: Hematology & Oncology

## 2020-04-14 ENCOUNTER — Encounter: Payer: Self-pay | Admitting: Family

## 2020-04-14 ENCOUNTER — Inpatient Hospital Stay: Payer: No Typology Code available for payment source

## 2020-04-14 ENCOUNTER — Other Ambulatory Visit: Payer: Self-pay | Admitting: Hematology & Oncology

## 2020-04-14 VITALS — BP 103/55 | HR 85 | Temp 98.0°F | Resp 17 | Ht 69.0 in | Wt 173.1 lb

## 2020-04-14 DIAGNOSIS — D6481 Anemia due to antineoplastic chemotherapy: Secondary | ICD-10-CM | POA: Diagnosis not present

## 2020-04-14 DIAGNOSIS — D649 Anemia, unspecified: Secondary | ICD-10-CM | POA: Diagnosis not present

## 2020-04-14 DIAGNOSIS — Z79899 Other long term (current) drug therapy: Secondary | ICD-10-CM | POA: Diagnosis not present

## 2020-04-14 DIAGNOSIS — Z881 Allergy status to other antibiotic agents status: Secondary | ICD-10-CM | POA: Diagnosis not present

## 2020-04-14 DIAGNOSIS — G629 Polyneuropathy, unspecified: Secondary | ICD-10-CM | POA: Insufficient documentation

## 2020-04-14 DIAGNOSIS — Z886 Allergy status to analgesic agent status: Secondary | ICD-10-CM | POA: Insufficient documentation

## 2020-04-14 DIAGNOSIS — N179 Acute kidney failure, unspecified: Secondary | ICD-10-CM | POA: Insufficient documentation

## 2020-04-14 DIAGNOSIS — N189 Chronic kidney disease, unspecified: Secondary | ICD-10-CM | POA: Insufficient documentation

## 2020-04-14 DIAGNOSIS — C9310 Chronic myelomonocytic leukemia not having achieved remission: Secondary | ICD-10-CM | POA: Diagnosis not present

## 2020-04-14 DIAGNOSIS — C8334 Diffuse large B-cell lymphoma, lymph nodes of axilla and upper limb: Secondary | ICD-10-CM | POA: Diagnosis not present

## 2020-04-14 DIAGNOSIS — T451X5A Adverse effect of antineoplastic and immunosuppressive drugs, initial encounter: Secondary | ICD-10-CM | POA: Diagnosis not present

## 2020-04-14 DIAGNOSIS — K5900 Constipation, unspecified: Secondary | ICD-10-CM | POA: Insufficient documentation

## 2020-04-14 DIAGNOSIS — Z888 Allergy status to other drugs, medicaments and biological substances status: Secondary | ICD-10-CM | POA: Insufficient documentation

## 2020-04-14 DIAGNOSIS — Z87440 Personal history of urinary (tract) infections: Secondary | ICD-10-CM | POA: Diagnosis not present

## 2020-04-14 DIAGNOSIS — M7989 Other specified soft tissue disorders: Secondary | ICD-10-CM | POA: Diagnosis not present

## 2020-04-14 DIAGNOSIS — D508 Other iron deficiency anemias: Secondary | ICD-10-CM

## 2020-04-14 LAB — CMP (CANCER CENTER ONLY)
ALT: 16 U/L (ref 0–44)
AST: 23 U/L (ref 15–41)
Albumin: 4 g/dL (ref 3.5–5.0)
Alkaline Phosphatase: 68 U/L (ref 38–126)
Anion gap: 11 (ref 5–15)
BUN: 49 mg/dL — ABNORMAL HIGH (ref 8–23)
CO2: 32 mmol/L (ref 22–32)
Calcium: 10 mg/dL (ref 8.9–10.3)
Chloride: 99 mmol/L (ref 98–111)
Creatinine: 2.05 mg/dL — ABNORMAL HIGH (ref 0.61–1.24)
GFR, Estimated: 30 mL/min — ABNORMAL LOW (ref >60)
Glucose, Bld: 120 mg/dL — ABNORMAL HIGH (ref 70–99)
Potassium: 4 mmol/L (ref 3.5–5.1)
Sodium: 142 mmol/L (ref 135–145)
Total Bilirubin: 1.7 mg/dL — ABNORMAL HIGH (ref 0.3–1.2)
Total Protein: 7.5 g/dL (ref 6.5–8.1)

## 2020-04-14 LAB — CBC WITH DIFFERENTIAL (CANCER CENTER ONLY)
Abs Immature Granulocytes: 0.25 10*3/uL — ABNORMAL HIGH (ref 0.00–0.07)
Basophils Absolute: 0.1 10*3/uL (ref 0.0–0.1)
Basophils Relative: 2 %
Eosinophils Absolute: 0.1 10*3/uL (ref 0.0–0.5)
Eosinophils Relative: 1 %
HCT: 30.5 % — ABNORMAL LOW (ref 39.0–52.0)
Hemoglobin: 9.8 g/dL — ABNORMAL LOW (ref 13.0–17.0)
Immature Granulocytes: 6 %
Lymphocytes Relative: 28 %
Lymphs Abs: 1.1 10*3/uL (ref 0.7–4.0)
MCH: 37.1 pg — ABNORMAL HIGH (ref 26.0–34.0)
MCHC: 32.1 g/dL (ref 30.0–36.0)
MCV: 115.5 fL — ABNORMAL HIGH (ref 80.0–100.0)
Monocytes Absolute: 1.2 10*3/uL — ABNORMAL HIGH (ref 0.1–1.0)
Monocytes Relative: 31 %
Neutro Abs: 1.3 10*3/uL — ABNORMAL LOW (ref 1.7–7.7)
Neutrophils Relative %: 32 %
Platelet Count: 46 10*3/uL — ABNORMAL LOW (ref 150–400)
RBC: 2.64 MIL/uL — ABNORMAL LOW (ref 4.22–5.81)
RDW: 27 % — ABNORMAL HIGH (ref 11.5–15.5)
WBC Count: 3.9 10*3/uL — ABNORMAL LOW (ref 4.0–10.5)
nRBC: 0.5 % — ABNORMAL HIGH (ref 0.0–0.2)

## 2020-04-14 LAB — SAMPLE TO BLOOD BANK

## 2020-04-14 LAB — RETICULOCYTES
Immature Retic Fract: 26.6 % — ABNORMAL HIGH (ref 2.3–15.9)
RBC.: 2.62 MIL/uL — ABNORMAL LOW (ref 4.22–5.81)
Retic Count, Absolute: 102.2 10*3/uL (ref 19.0–186.0)
Retic Ct Pct: 3.9 % — ABNORMAL HIGH (ref 0.4–3.1)

## 2020-04-14 MED ORDER — LUSPATERCEPT-AAMT 75 MG ~~LOC~~ SOLR
1.0000 mg/kg | Freq: Once | SUBCUTANEOUS | Status: AC
  Administered 2020-04-14: 90 mg via SUBCUTANEOUS
  Filled 2020-04-14: qty 1.5

## 2020-04-14 NOTE — Telephone Encounter (Signed)
I know we faxed the orders-but I just want to clarify what should be done for patient? Im just a little confused.

## 2020-04-14 NOTE — Telephone Encounter (Signed)
  Called and left message on machine.  We did order his portable oxygen concentrator over a week ago.  I presume he did not hear from the company? I will send in a MyChart message as well

## 2020-04-14 NOTE — Patient Instructions (Signed)
Luspatercept injection What is this medicine? LUSPATERCEPT (lus PAT er sept) helps your body make more red blood cells. This medicine is used to treat anemia caused by beta thalassemia or myelodysplastic syndromes. This medicine may be used for other purposes; ask your health care provider or pharmacist if you have questions. COMMON BRAND NAME(S): REBLOZYL What should I tell my health care provider before I take this medicine? They need to know if you have any of these conditions:  cigarette smoker  have had your spleen removed  high blood pressure  history of blood clots  an unusual or allergic reaction to luspatercept, other medicines, foods, dyes or preservatives  pregnant or trying to get pregnant  breast-feeding How should I use this medicine? This medicine is for injection under the skin. It is given by a healthcare professional in a hospital or clinic setting. Talk to your pediatrician about the use of the medicine in children. This medicine is not approved for use in children. Overdosage: If you think you have taken too much of this medicine contact a poison control center or emergency room at once. NOTE: This medicine is only for you. Do not share this medicine with others. What if I miss a dose? Keep appointments for follow-up doses. It is important not to miss your dose. Call your doctor or healthcare professional if you are unable to keep an appointment. What may interact with this medicine? Interactions are not expected. This list may not describe all possible interactions. Give your health care provider a list of all the medicines, herbs, non-prescription drugs, or dietary supplements you use. Also tell them if you smoke, drink alcohol, or use illegal drugs. Some items may interact with your medicine. What should I watch for while using this medicine? Your condition will be monitored carefully while you are receiving this medicine. Do not become pregnant while taking  this medicine or for 3 months after stopping it. Women should inform their healthcare professional if they wish to become pregnant or think they might be pregnant. There is a potential for serious side effects and harm to an unborn child. Talk to your healthcare professional for more information. Do not breast-feed an infant while taking this medicine. You may need blood work done while you are taking this medicine. What side effects may I notice from receiving this medicine? Side effects that you should report to your doctor or health care professional as soon as possible:  allergic reactions like skin rash, itching or hives; swelling of the face, lips, or tongue  signs and symptoms of a blood clot such as chest pain; shortness of breath; pain, swelling, or warmth in the leg  signs and symptoms of a stroke like changes in vision; confusion; trouble speaking or understanding; severe headaches; sudden numbness or weakness of the face, arm or leg; trouble walking; dizziness; loss of balance or coordination Side effects that usually do not require medical attention (report these to your doctor or health care professional if they continue or are bothersome):  cough  diarrhea  dizziness  headache  joint pain  stomach pain  tiredness This list may not describe all possible side effects. Call your doctor for medical advice about side effects. You may report side effects to FDA at 1-800-FDA-1088. Where should I keep my medicine? This medicine is given in a hospital or clinic and will not be stored at home. NOTE: This sheet is a summary. It may not cover all possible information. If you have questions about   this medicine, talk to your doctor, pharmacist, or health care provider.  2020 [employer]vier/Gold Standard (2018-09-30 15:57:59)  

## 2020-04-14 NOTE — Progress Notes (Signed)
Hematology and Oncology Follow Up Visit  Jeremiah Walker [id number] 1927/10/03 86 y.o. 04/14/2020   Principle Diagnosis:  History of diffuse large cell non-Hodgkin's lymphoma-treated in [place] Chronic Myelomonocytic Leukemia (CMMoL) - Normal cytogenetics  Current Therapy: Luspatercept 1 mg/m2 sq -- started on 03/03/2020 Aranesp500 mcg sq q 3 weeks for Hgb < 11 - d/c on 59/56/3875 Folic acid 2 mg PO daily   Interim History:  Jeremiah Walker is here today for follow-up and Luspatercept. He was hospitalized for a week earlier this month with symptomatic anemia, acute on chronic kidney disease and UTI. Her received 1 unit of blood and Aranesp during admission. Hgb today is up to 9.8, MCV 115, WBC count 3.9 and platelets are improved at 46.  He is now at [facility] and slowly feeling better. He was able to ambulate in using a rolling walker.  No falls or syncopal episode to report since being home.  No episodes of bleeding. He does bruise easily but not in excess. No petechiae.  No fever, chills, n/v, cough, rash, dizziness, SOB, chest pain, palpitations, abdominal pain or changes in bowel or bladder habits.  He states that his constipation seems better.  The neuropathy in his hands and feet is unchanged. He has had since his initial treatment with R-CHOP in 2012.  He has mild swelling in his ankles that waxes and wanes. He plans to try compression stockings. Pedal pulses are 2+.  He has maintained a good appetite and is staying well hydrated. His weight is stable at 173 lbs.   ECOG Performance Status: 1 - Symptomatic but completely ambulatory  Medications:  Allergies as of 04/14/2020      Reactions   Nitroglycerin Other (See Comments)   "I ended up in a fetal position from the pain"   Nsaids Other (See Comments), Tinitus   Bruising   Statins Nausea And Vomiting, Other (See Comments)   Kidney issues and muscle pain   Tolmetin Other (See Comments)   Bruising    Ciprofloxacin Other (See Comments)   Achilles tendon issues   Ropinirole Anxiety, Other (See Comments)   Mood swings, also   Requip [ropinirole Hcl] Anxiety      Medication List       Accurate as of April 14, 2020 10:14 AM. If you have any questions, ask your nurse or doctor.        acetaminophen 500 MG tablet Commonly known as: TYLENOL Take 1 tablet (500 mg total) by mouth every 8 (eight) hours.   allopurinol 100 MG tablet Commonly known as: ZYLOPRIM TAKE 2 TABLETS(200 MG) BY MOUTH DAILY What changed:   how much to take  how to take this  when to take this  additional instructions   ALPRAZolam 0.5 MG tablet Commonly known as: XANAX TAKE 1 TABLET(0.5 MG) BY MOUTH THREE TIMES DAILY AS NEEDED FOR ANXIETY What changed:   how much to take  how to take this  when to take this  reasons to take this  additional instructions   b complex vitamins tablet Take 1 tablet by mouth daily.   Calcium 500 MG tablet Take 500 mg by mouth daily.   colchicine 0.6 MG tablet Take 0.5 tablets (0.3 mg total) by mouth 2 (two) times daily.   DRY EYES OP Apply 1 drop to eye daily as needed (for dry eyes).   fluorouracil 5 % cream Commonly known as: EFUDEX Apply 1 application topically.   folic acid 1 MG tablet Commonly known as: [employer]  TAKE 2 TABLETS(2 MG) BY MOUTH DAILY What changed: See the new instructions.   furosemide 20 MG tablet Commonly known as: LASIX Take 3 tablets (60 mg total) by mouth daily.   gabapentin 300 MG capsule Commonly known as: NEURONTIN TAKE 1 CAPSULE BY MOUTH THREE TIMES DAILY PLUS 2 CAPSULES AS NEEDED What changed:   how much to take  how to take this  when to take this  additional instructions   Melatonin 10 MG Tabs Take 20 mg by mouth at bedtime.   metoprolol succinate 25 MG 24 hr tablet Commonly known as: TOPROL-XL Take 1 tablet (25 mg total) by mouth daily.   polyethylene glycol 17 g packet Commonly known as: MIRALAX /  GLYCOLAX Take 17 g by mouth daily as needed for mild constipation.   Potassium Chloride ER 20 MEQ Tbcr Take 20 mEq by mouth daily.   Systane Ultra PF 0.4-0.3 % Soln Generic drug: Polyethyl Glyc-Propyl Glyc PF Place 1-2 drops into both eyes 3 (three) times daily as needed (for dryness).   VITAMIN B-12 PO Take 1 tablet by mouth daily after breakfast.   VITAMIN B-6 PO Take 1 tablet by mouth daily after breakfast.   vitamin C 500 MG tablet Commonly known as: ASCORBIC ACID Take 1,000 mg by mouth daily.   Vitamin D3 50 MCG (2000 UT) capsule Take 2,000 Units by mouth daily.   zinc gluconate 50 MG tablet Take 50 mg by mouth daily.       Allergies:  Allergies  Allergen Reactions   Nitroglycerin Other (See Comments)    "I ended up in a fetal position from the pain"   Nsaids Other (See Comments) and Tinitus    Bruising    Statins Nausea And Vomiting and Other (See Comments)    Kidney issues and muscle pain    Tolmetin Other (See Comments)    Bruising   Ciprofloxacin Other (See Comments)    Achilles tendon issues   Ropinirole Anxiety and Other (See Comments)    Mood swings, also   Requip [Ropinirole Hcl] Anxiety    Past Medical History, Surgical history, Social history, and Family History were reviewed and updated.  Review of Systems: All other 10 point review of systems is negative.   Physical Exam:  height is 5\' 9"  (1.753 m) and weight is 173 lb 1.9 oz (78.5 kg). His temperature is 98 F (36.7 C). His blood pressure is 103/55 (abnormal) and his pulse is 85. His respiration is 17 and oxygen saturation is 94%.   Wt Readings from Last 3 Encounters:  04/14/20 173 lb 1.9 oz (78.5 kg)  04/10/20 171 lb 1.2 oz (77.6 kg)  03/24/20 190 lb (86.2 kg)    Ocular: Sclerae unicteric, pupils equal, round and reactive to light Ear-nose-throat: Oropharynx clear, dentition fair Lymphatic: No cervical or supraclavicular adenopathy Lungs no rales or rhonchi, good excursion  bilaterally Heart regular rate and rhythm, no murmur appreciated Abd soft, nontender, positive bowel sounds MSK no focal spinal tenderness, no joint edema Neuro: non-focal, well-oriented, appropriate affect Breasts: Deferred   Lab Results  Component Value Date   WBC 3.9 (L) 04/14/2020   HGB 9.8 (L) 04/14/2020   HCT 30.5 (L) 04/14/2020   MCV 115.5 (H) 04/14/2020   PLT 46 (L) 04/14/2020   Lab Results  Component Value Date   FERRITIN 777 (H) 03/24/2020   IRON 138 03/24/2020   TIBC 256 03/24/2020   UIBC 118 03/24/2020   IRONPCTSAT 54 03/24/2020   Lab Results  Component Value Date   RETICCTPCT 3.9 (H) 04/14/2020   RBC 2.62 (L) 04/14/2020   No results found for: KPAFRELGTCHN, LAMBDASER, KAPLAMBRATIO No results found for: IGGSERUM, IGA, IGMSERUM No results found for: Odetta Pink, SPEI   Chemistry      Component Value Date/Time   NA 142 04/14/2020 0922   NA 143 01/13/2020 1207   NA 139 05/08/2017 1123   NA 141 05/15/2016 1126   K 4.0 04/14/2020 0922   K 4.0 05/08/2017 1123   K 4.3 05/15/2016 1126   CL 99 04/14/2020 0922   CL 105 05/08/2017 1123   CO2 32 04/14/2020 0922   CO2 30 05/08/2017 1123   CO2 26 05/15/2016 1126   BUN 49 (H) 04/14/2020 0922   BUN 26 01/13/2020 1207   BUN 15 05/08/2017 1123   BUN 29.1 (H) 05/15/2016 1126   CREATININE 2.05 (H) 04/14/2020 0922   CREATININE 1.2 05/08/2017 1123   CREATININE 1.7 (H) 05/15/2016 1126   GLU 90 11/15/2014 0000      Component Value Date/Time   CALCIUM 10.0 04/14/2020 0922   CALCIUM 9.1 05/08/2017 1123   CALCIUM 9.7 05/15/2016 1126   ALKPHOS 68 04/14/2020 0922   ALKPHOS 64 05/08/2017 1123   ALKPHOS 69 05/15/2016 1126   AST 23 04/14/2020 0922   AST 23 05/15/2016 1126   ALT 16 04/14/2020 0922   ALT 19 05/08/2017 1123   ALT 22 05/15/2016 1126   BILITOT 1.7 (H) 04/14/2020 0922   BILITOT 0.84 05/15/2016 1126       Impression and Plan: JeremiahVincentis [id number]  caucasian gentleman withhistory of diffuse large cell non-Hodgkin's lymphomatreated with6 cycles of R-CHOP completed in April 2014 and now Chronic Myelomonocytic Leukemia (CMMoL).  He is feeling much better since his discharge from the hospital.  We will proceed with Luspatercept today as planned. No transfusion needed at this time.  We will continue to follow along closely and see him again in 3 weeks.  He was encouraged to contact our office with any questions or concerns.   Laverna Peace, NP 10/21/202110:14 AM

## 2020-04-14 NOTE — Progress Notes (Signed)
Patients labs have been reviewed by Laverna Peace, NP. Okay to receive Reblozyl today.

## 2020-04-14 NOTE — Progress Notes (Signed)
Reviewed pt labs (CBC) with Laverna Peace, NP and pt ok to treat.  Pt discharged in no apparent distress. Pt left ambulatory with walker.  Pt aware of discharge instructions and verbalized understanding and had no further questions.

## 2020-04-15 ENCOUNTER — Encounter: Payer: Self-pay | Admitting: Cardiology

## 2020-04-15 ENCOUNTER — Other Ambulatory Visit: Payer: Self-pay

## 2020-04-15 ENCOUNTER — Ambulatory Visit (INDEPENDENT_AMBULATORY_CARE_PROVIDER_SITE_OTHER): Payer: Medicare Other | Admitting: Cardiology

## 2020-04-15 VITALS — BP 100/61 | HR 79 | Temp 97.0°F | Ht 70.0 in | Wt 176.4 lb

## 2020-04-15 DIAGNOSIS — N1832 Chronic kidney disease, stage 3b: Secondary | ICD-10-CM | POA: Diagnosis not present

## 2020-04-15 DIAGNOSIS — I10 Essential (primary) hypertension: Secondary | ICD-10-CM

## 2020-04-15 DIAGNOSIS — I350 Nonrheumatic aortic (valve) stenosis: Secondary | ICD-10-CM | POA: Diagnosis not present

## 2020-04-15 DIAGNOSIS — I5043 Acute on chronic combined systolic (congestive) and diastolic (congestive) heart failure: Secondary | ICD-10-CM

## 2020-04-15 LAB — IRON AND TIBC
Iron: 130 ug/dL (ref 42–163)
Saturation Ratios: 53 % (ref 20–55)
TIBC: 245 ug/dL (ref 202–409)
UIBC: 115 ug/dL — ABNORMAL LOW (ref 117–376)

## 2020-04-15 LAB — ERYTHROPOIETIN: Erythropoietin: 175.1 m[IU]/mL — ABNORMAL HIGH (ref 2.6–18.5)

## 2020-04-15 LAB — FERRITIN: Ferritin: 848 ng/mL — ABNORMAL HIGH (ref 24–336)

## 2020-04-15 NOTE — Patient Instructions (Signed)
Medication Instructions:  Your physician recommends that you continue on your current medications as directed. Please refer to the Current Medication list given to you today.  *If you need a refill on your cardiac medications before your next appointment, please call your pharmacy*   Lab Work: Magnesium with your next lab draw  If you have labs (blood work) drawn today and your tests are completely normal, you will receive your results only by: Marland Kitchen MyChart Message (if you have MyChart) OR . A paper copy in the mail If you have any lab test that is abnormal or we need to change your treatment, we will call you to review the results.  Follow-Up: At [facility], you and your health needs are our priority.  As part of our continuing mission to provide you with exceptional heart care, we have created designated Provider Care Teams.  These Care Teams include your primary Cardiologist (physician) and Advanced Practice Providers (APPs -  Physician Assistants and Nurse Practitioners) who all work together to provide you with the care you need, when you need it.  We recommend signing up for the patient portal called "MyChart".  Sign up information is provided on this After Visit Summary.  MyChart is used to connect with patients for Virtual Visits (Telemedicine).  Patients are able to view lab/test results, encounter notes, upcoming appointments, etc.  Non-urgent messages can be sent to your provider as well.   To learn more about what you can do with MyChart, go to [email or web address].    Your next appointment:   1 month(s)  The format for your next appointment:   In Person  Provider:   Oswaldo Milian, MD   Other Instructions Please weight daily and have facility staff call if you gain 3 lbs overnight or 5 lbs in 1 week.

## 2020-04-19 ENCOUNTER — Telehealth: Payer: Self-pay | Admitting: Physical Medicine and Rehabilitation

## 2020-04-19 NOTE — Telephone Encounter (Signed)
Pt called stating pain started in his lower lumbar yesterday and he would likea CB at our earliest conveince to set a appt  [phone]

## 2020-04-19 NOTE — Telephone Encounter (Signed)
Yes but if needed OV

## 2020-04-19 NOTE — Telephone Encounter (Signed)
Patient had left L2 TF on 7/14. Ok to repeat if helped, same problem/side, and no new injury?

## 2020-04-20 NOTE — Telephone Encounter (Signed)
Called patient. He is feeling better today and will call us if his pain returns.

## 2020-04-21 DIAGNOSIS — M545 Low back pain, unspecified: Secondary | ICD-10-CM | POA: Diagnosis not present

## 2020-04-21 DIAGNOSIS — G8929 Other chronic pain: Secondary | ICD-10-CM | POA: Diagnosis not present

## 2020-04-25 ENCOUNTER — Other Ambulatory Visit: Payer: Self-pay | Admitting: Cardiology

## 2020-05-02 DIAGNOSIS — I10 Essential (primary) hypertension: Secondary | ICD-10-CM | POA: Diagnosis not present

## 2020-05-05 ENCOUNTER — Inpatient Hospital Stay: Payer: Medicare Other

## 2020-05-05 ENCOUNTER — Inpatient Hospital Stay: Payer: Medicare Other | Admitting: Hematology & Oncology

## 2020-05-05 ENCOUNTER — Inpatient Hospital Stay: Payer: Medicare Other | Attending: Hematology & Oncology

## 2020-05-07 DIAGNOSIS — R0781 Pleurodynia: Secondary | ICD-10-CM | POA: Diagnosis not present

## 2020-05-11 DIAGNOSIS — C9310 Chronic myelomonocytic leukemia not having achieved remission: Secondary | ICD-10-CM | POA: Diagnosis not present

## 2020-05-11 DIAGNOSIS — I130 Hypertensive heart and chronic kidney disease with heart failure and stage 1 through stage 4 chronic kidney disease, or unspecified chronic kidney disease: Secondary | ICD-10-CM | POA: Diagnosis not present

## 2020-05-11 DIAGNOSIS — M6281 Muscle weakness (generalized): Secondary | ICD-10-CM | POA: Diagnosis not present

## 2020-05-11 DIAGNOSIS — I5041 Acute combined systolic (congestive) and diastolic (congestive) heart failure: Secondary | ICD-10-CM | POA: Diagnosis not present

## 2020-05-11 DIAGNOSIS — R2689 Other abnormalities of gait and mobility: Secondary | ICD-10-CM | POA: Diagnosis not present

## 2020-05-11 DIAGNOSIS — Z9181 History of falling: Secondary | ICD-10-CM | POA: Diagnosis not present

## 2020-05-12 DIAGNOSIS — M6281 Muscle weakness (generalized): Secondary | ICD-10-CM | POA: Diagnosis not present

## 2020-05-12 DIAGNOSIS — I130 Hypertensive heart and chronic kidney disease with heart failure and stage 1 through stage 4 chronic kidney disease, or unspecified chronic kidney disease: Secondary | ICD-10-CM | POA: Diagnosis not present

## 2020-05-12 DIAGNOSIS — R2689 Other abnormalities of gait and mobility: Secondary | ICD-10-CM | POA: Diagnosis not present

## 2020-05-12 DIAGNOSIS — C9310 Chronic myelomonocytic leukemia not having achieved remission: Secondary | ICD-10-CM | POA: Diagnosis not present

## 2020-05-12 DIAGNOSIS — I5041 Acute combined systolic (congestive) and diastolic (congestive) heart failure: Secondary | ICD-10-CM | POA: Diagnosis not present

## 2020-05-12 DIAGNOSIS — Z9181 History of falling: Secondary | ICD-10-CM | POA: Diagnosis not present

## 2020-05-14 ENCOUNTER — Encounter: Payer: Self-pay | Admitting: Hematology & Oncology

## 2020-05-14 DIAGNOSIS — Z79899 Other long term (current) drug therapy: Secondary | ICD-10-CM | POA: Diagnosis not present

## 2020-05-14 DIAGNOSIS — I352 Nonrheumatic aortic (valve) stenosis with insufficiency: Secondary | ICD-10-CM | POA: Diagnosis not present

## 2020-05-14 DIAGNOSIS — Z9981 Dependence on supplemental oxygen: Secondary | ICD-10-CM | POA: Diagnosis not present

## 2020-05-14 DIAGNOSIS — G629 Polyneuropathy, unspecified: Secondary | ICD-10-CM | POA: Diagnosis not present

## 2020-05-14 DIAGNOSIS — C9310 Chronic myelomonocytic leukemia not having achieved remission: Secondary | ICD-10-CM | POA: Diagnosis present

## 2020-05-14 DIAGNOSIS — N1832 Chronic kidney disease, stage 3b: Secondary | ICD-10-CM | POA: Diagnosis not present

## 2020-05-14 DIAGNOSIS — F0390 Unspecified dementia without behavioral disturbance: Secondary | ICD-10-CM | POA: Diagnosis present

## 2020-05-14 DIAGNOSIS — R4182 Altered mental status, unspecified: Secondary | ICD-10-CM | POA: Diagnosis not present

## 2020-05-14 DIAGNOSIS — J811 Chronic pulmonary edema: Secondary | ICD-10-CM | POA: Diagnosis not present

## 2020-05-14 DIAGNOSIS — I509 Heart failure, unspecified: Secondary | ICD-10-CM | POA: Diagnosis not present

## 2020-05-14 DIAGNOSIS — Z20822 Contact with and (suspected) exposure to covid-19: Secondary | ICD-10-CM | POA: Diagnosis present

## 2020-05-14 DIAGNOSIS — F05 Delirium due to known physiological condition: Secondary | ICD-10-CM | POA: Diagnosis not present

## 2020-05-14 DIAGNOSIS — D649 Anemia, unspecified: Secondary | ICD-10-CM | POA: Diagnosis not present

## 2020-05-14 DIAGNOSIS — E876 Hypokalemia: Secondary | ICD-10-CM | POA: Diagnosis not present

## 2020-05-14 DIAGNOSIS — J90 Pleural effusion, not elsewhere classified: Secondary | ICD-10-CM | POA: Diagnosis not present

## 2020-05-14 DIAGNOSIS — I959 Hypotension, unspecified: Secondary | ICD-10-CM | POA: Diagnosis not present

## 2020-05-14 DIAGNOSIS — D61818 Other pancytopenia: Secondary | ICD-10-CM | POA: Diagnosis not present

## 2020-05-14 DIAGNOSIS — N189 Chronic kidney disease, unspecified: Secondary | ICD-10-CM | POA: Diagnosis not present

## 2020-05-14 DIAGNOSIS — M109 Gout, unspecified: Secondary | ICD-10-CM | POA: Diagnosis present

## 2020-05-14 DIAGNOSIS — S82491A Other fracture of shaft of right fibula, initial encounter for closed fracture: Secondary | ICD-10-CM | POA: Diagnosis present

## 2020-05-14 DIAGNOSIS — N183 Chronic kidney disease, stage 3 unspecified: Secondary | ICD-10-CM | POA: Diagnosis not present

## 2020-05-14 DIAGNOSIS — M2550 Pain in unspecified joint: Secondary | ICD-10-CM | POA: Diagnosis not present

## 2020-05-14 DIAGNOSIS — I214 Non-ST elevation (NSTEMI) myocardial infarction: Secondary | ICD-10-CM | POA: Diagnosis present

## 2020-05-14 DIAGNOSIS — I350 Nonrheumatic aortic (valve) stenosis: Secondary | ICD-10-CM | POA: Diagnosis present

## 2020-05-14 DIAGNOSIS — R55 Syncope and collapse: Secondary | ICD-10-CM | POA: Diagnosis not present

## 2020-05-14 DIAGNOSIS — R5381 Other malaise: Secondary | ICD-10-CM | POA: Diagnosis not present

## 2020-05-14 DIAGNOSIS — S82401A Unspecified fracture of shaft of right fibula, initial encounter for closed fracture: Secondary | ICD-10-CM | POA: Diagnosis not present

## 2020-05-14 DIAGNOSIS — K219 Gastro-esophageal reflux disease without esophagitis: Secondary | ICD-10-CM | POA: Diagnosis not present

## 2020-05-14 DIAGNOSIS — Z043 Encounter for examination and observation following other accident: Secondary | ICD-10-CM | POA: Diagnosis not present

## 2020-05-14 DIAGNOSIS — Z9181 History of falling: Secondary | ICD-10-CM | POA: Diagnosis not present

## 2020-05-14 DIAGNOSIS — I5022 Chronic systolic (congestive) heart failure: Secondary | ICD-10-CM | POA: Diagnosis not present

## 2020-05-14 DIAGNOSIS — R2689 Other abnormalities of gait and mobility: Secondary | ICD-10-CM | POA: Diagnosis not present

## 2020-05-14 DIAGNOSIS — R600 Localized edema: Secondary | ICD-10-CM | POA: Diagnosis not present

## 2020-05-14 DIAGNOSIS — S82431A Displaced oblique fracture of shaft of right fibula, initial encounter for closed fracture: Secondary | ICD-10-CM | POA: Diagnosis not present

## 2020-05-14 DIAGNOSIS — R296 Repeated falls: Secondary | ICD-10-CM | POA: Diagnosis present

## 2020-05-14 DIAGNOSIS — I440 Atrioventricular block, first degree: Secondary | ICD-10-CM | POA: Diagnosis not present

## 2020-05-14 DIAGNOSIS — G4700 Insomnia, unspecified: Secondary | ICD-10-CM | POA: Diagnosis present

## 2020-05-14 DIAGNOSIS — R7989 Other specified abnormal findings of blood chemistry: Secondary | ICD-10-CM | POA: Diagnosis not present

## 2020-05-14 DIAGNOSIS — I517 Cardiomegaly: Secondary | ICD-10-CM | POA: Diagnosis not present

## 2020-05-14 DIAGNOSIS — C8330 Diffuse large B-cell lymphoma, unspecified site: Secondary | ICD-10-CM | POA: Diagnosis not present

## 2020-05-14 DIAGNOSIS — S82831A Other fracture of upper and lower end of right fibula, initial encounter for closed fracture: Secondary | ICD-10-CM | POA: Diagnosis not present

## 2020-05-14 DIAGNOSIS — J9610 Chronic respiratory failure, unspecified whether with hypoxia or hypercapnia: Secondary | ICD-10-CM | POA: Diagnosis not present

## 2020-05-14 DIAGNOSIS — I5043 Acute on chronic combined systolic (congestive) and diastolic (congestive) heart failure: Secondary | ICD-10-CM | POA: Diagnosis present

## 2020-05-14 DIAGNOSIS — J9611 Chronic respiratory failure with hypoxia: Secondary | ICD-10-CM | POA: Diagnosis present

## 2020-05-14 DIAGNOSIS — I5041 Acute combined systolic (congestive) and diastolic (congestive) heart failure: Secondary | ICD-10-CM | POA: Diagnosis not present

## 2020-05-14 DIAGNOSIS — I5023 Acute on chronic systolic (congestive) heart failure: Secondary | ICD-10-CM | POA: Diagnosis not present

## 2020-05-14 DIAGNOSIS — R58 Hemorrhage, not elsewhere classified: Secondary | ICD-10-CM | POA: Diagnosis not present

## 2020-05-14 DIAGNOSIS — E785 Hyperlipidemia, unspecified: Secondary | ICD-10-CM | POA: Diagnosis not present

## 2020-05-14 DIAGNOSIS — W19XXXA Unspecified fall, initial encounter: Secondary | ICD-10-CM | POA: Diagnosis not present

## 2020-05-14 DIAGNOSIS — S0990XA Unspecified injury of head, initial encounter: Secondary | ICD-10-CM | POA: Diagnosis not present

## 2020-05-14 DIAGNOSIS — Z23 Encounter for immunization: Secondary | ICD-10-CM | POA: Diagnosis not present

## 2020-05-14 DIAGNOSIS — D696 Thrombocytopenia, unspecified: Secondary | ICD-10-CM | POA: Diagnosis not present

## 2020-05-14 DIAGNOSIS — I083 Combined rheumatic disorders of mitral, aortic and tricuspid valves: Secondary | ICD-10-CM | POA: Diagnosis not present

## 2020-05-14 DIAGNOSIS — Z66 Do not resuscitate: Secondary | ICD-10-CM | POA: Diagnosis present

## 2020-05-14 DIAGNOSIS — I130 Hypertensive heart and chronic kidney disease with heart failure and stage 1 through stage 4 chronic kidney disease, or unspecified chronic kidney disease: Secondary | ICD-10-CM | POA: Diagnosis not present

## 2020-05-14 DIAGNOSIS — I951 Orthostatic hypotension: Secondary | ICD-10-CM | POA: Diagnosis present

## 2020-05-14 DIAGNOSIS — F17290 Nicotine dependence, other tobacco product, uncomplicated: Secondary | ICD-10-CM | POA: Diagnosis present

## 2020-05-14 DIAGNOSIS — S82201A Unspecified fracture of shaft of right tibia, initial encounter for closed fracture: Secondary | ICD-10-CM | POA: Diagnosis not present

## 2020-05-14 DIAGNOSIS — S82409A Unspecified fracture of shaft of unspecified fibula, initial encounter for closed fracture: Secondary | ICD-10-CM | POA: Diagnosis not present

## 2020-05-14 DIAGNOSIS — F419 Anxiety disorder, unspecified: Secondary | ICD-10-CM | POA: Diagnosis not present

## 2020-05-14 DIAGNOSIS — D689 Coagulation defect, unspecified: Secondary | ICD-10-CM | POA: Diagnosis not present

## 2020-05-14 DIAGNOSIS — N179 Acute kidney failure, unspecified: Secondary | ICD-10-CM | POA: Diagnosis present

## 2020-05-14 DIAGNOSIS — I493 Ventricular premature depolarization: Secondary | ICD-10-CM | POA: Diagnosis not present

## 2020-05-14 DIAGNOSIS — R0602 Shortness of breath: Secondary | ICD-10-CM | POA: Diagnosis not present

## 2020-05-14 DIAGNOSIS — N184 Chronic kidney disease, stage 4 (severe): Secondary | ICD-10-CM | POA: Diagnosis present

## 2020-05-14 DIAGNOSIS — F32A Depression, unspecified: Secondary | ICD-10-CM | POA: Diagnosis not present

## 2020-05-14 DIAGNOSIS — I272 Pulmonary hypertension, unspecified: Secondary | ICD-10-CM | POA: Diagnosis present

## 2020-05-14 DIAGNOSIS — D539 Nutritional anemia, unspecified: Secondary | ICD-10-CM | POA: Diagnosis present

## 2020-05-14 DIAGNOSIS — Z7401 Bed confinement status: Secondary | ICD-10-CM | POA: Diagnosis not present

## 2020-05-17 ENCOUNTER — Ambulatory Visit: Payer: Medicare Other | Admitting: Cardiology

## 2020-05-18 ENCOUNTER — Ambulatory Visit: Payer: Medicare Other | Admitting: Family Medicine

## 2020-05-19 DIAGNOSIS — I5022 Chronic systolic (congestive) heart failure: Secondary | ICD-10-CM | POA: Diagnosis not present

## 2020-05-19 DIAGNOSIS — D649 Anemia, unspecified: Secondary | ICD-10-CM | POA: Diagnosis not present

## 2020-05-19 DIAGNOSIS — Z9981 Dependence on supplemental oxygen: Secondary | ICD-10-CM | POA: Diagnosis not present

## 2020-05-19 DIAGNOSIS — M2550 Pain in unspecified joint: Secondary | ICD-10-CM | POA: Diagnosis not present

## 2020-05-19 DIAGNOSIS — R5381 Other malaise: Secondary | ICD-10-CM | POA: Diagnosis not present

## 2020-05-19 DIAGNOSIS — Z9181 History of falling: Secondary | ICD-10-CM | POA: Diagnosis not present

## 2020-05-19 DIAGNOSIS — Z7401 Bed confinement status: Secondary | ICD-10-CM | POA: Diagnosis not present

## 2020-05-19 DIAGNOSIS — E785 Hyperlipidemia, unspecified: Secondary | ICD-10-CM | POA: Diagnosis not present

## 2020-05-19 DIAGNOSIS — F419 Anxiety disorder, unspecified: Secondary | ICD-10-CM | POA: Diagnosis not present

## 2020-05-19 DIAGNOSIS — R4182 Altered mental status, unspecified: Secondary | ICD-10-CM | POA: Diagnosis not present

## 2020-05-19 DIAGNOSIS — K219 Gastro-esophageal reflux disease without esophagitis: Secondary | ICD-10-CM | POA: Diagnosis not present

## 2020-05-19 DIAGNOSIS — D696 Thrombocytopenia, unspecified: Secondary | ICD-10-CM | POA: Diagnosis not present

## 2020-05-19 DIAGNOSIS — J9610 Chronic respiratory failure, unspecified whether with hypoxia or hypercapnia: Secondary | ICD-10-CM | POA: Diagnosis not present

## 2020-05-19 DIAGNOSIS — M109 Gout, unspecified: Secondary | ICD-10-CM | POA: Diagnosis not present

## 2020-05-19 DIAGNOSIS — E876 Hypokalemia: Secondary | ICD-10-CM | POA: Diagnosis not present

## 2020-05-19 DIAGNOSIS — I130 Hypertensive heart and chronic kidney disease with heart failure and stage 1 through stage 4 chronic kidney disease, or unspecified chronic kidney disease: Secondary | ICD-10-CM | POA: Diagnosis not present

## 2020-05-19 DIAGNOSIS — F32A Depression, unspecified: Secondary | ICD-10-CM | POA: Diagnosis not present

## 2020-05-19 DIAGNOSIS — N1832 Chronic kidney disease, stage 3b: Secondary | ICD-10-CM | POA: Diagnosis not present

## 2020-05-19 DIAGNOSIS — C8330 Diffuse large B-cell lymphoma, unspecified site: Secondary | ICD-10-CM | POA: Diagnosis not present

## 2020-05-19 DIAGNOSIS — C9310 Chronic myelomonocytic leukemia not having achieved remission: Secondary | ICD-10-CM | POA: Diagnosis not present

## 2020-05-19 DIAGNOSIS — N179 Acute kidney failure, unspecified: Secondary | ICD-10-CM | POA: Diagnosis not present

## 2020-05-19 DIAGNOSIS — G629 Polyneuropathy, unspecified: Secondary | ICD-10-CM | POA: Diagnosis not present

## 2020-05-19 DIAGNOSIS — J9611 Chronic respiratory failure with hypoxia: Secondary | ICD-10-CM | POA: Diagnosis not present

## 2020-05-19 DIAGNOSIS — I5041 Acute combined systolic (congestive) and diastolic (congestive) heart failure: Secondary | ICD-10-CM | POA: Diagnosis not present

## 2020-05-20 NOTE — Progress Notes (Deleted)
[place] [facility] [address] [place], [place] [address] [phone]  Date:  05/25/2020   Name:  Jeremiah Walker   DOB:  [birth year]   MRN:  [id number]  PCP:  [name], MD    Chief Complaint: No chief complaint on file.   History of Present Illness:  Jeremiah Walker is a 86 y.o. very pleasant male patient who presents with the following:  Patient here today for follow-up visit-accompanied by his son Jeremiah Walker Last seen by myself in September of this year Mentally sharp elderly gentleman with history of spinal stenosis causing neurogenic claudication and chronic pain, peripheral neuropathy, chronic pain disease, CML, CHF, aortic stenosis Over the last 6 months he has begun having a lot more difficulty with his health  He was admitted in October for about a week-  for symptomatic anemia, acute on chronic combined CHF, AKI on CKD-3B and UTI. Patient was previously on Aranesp and recently switched to [place].   More recently, he was admitted again after a fall with fracture-he was admitted from 11/22-11/25 at [facility]  Discharge Follow-up Action Items: 1. Follow up with PCP scheduled for 05/25/2020 2. Follow up with Oncology scheduled for 05/26/2020 At [place] [facility] (will need CBC to assess Hgb and Plts) 3. START Lasix 60mg  PO BID 4. Check BMP/Mag within 7 days to assess renal function (sCr on discharge 2.2, improved from 3.22) 5. Metoprolol Succinate decreased from 25mg  daily to 12.5mg  daily   Hospital Course:  Briefly, Jeremiah Walker is a 86 year old gentleman with a history of dementia, CMML transfusion dependent, HFrEF, CKD stage IIIb, chronic respiratory failure on 2 L oxygen was transferred from his assisted living facility after an unwitnessed fall with concern for possible syncopal event found to have right proximal fibula fracture, NSTEMI, AKI on CKD, anemia with hemoglobin of 6.3,  and pancytopenia with severe thrombocytopenia. Hospital course will be summarized in a problem based format  #Acute on chronic HFrEF exacerbation #Chronic hypoxic respiratory failure 2/2 heart failure #NSTEMI Type II 2/2 demand ischemia #Anemia Patient follows with cardiology at [facility], last seen on 04/15/2020 with recent heart failure exacerbation on 10/21. At that time, he was found to have a new EF from 30 to 35%. No catheterization was performed due to thrombocytopenia and patient was discharged on Lasix of 60 mg daily and Toprol succinate 25 mg daily. EKG on that admission was noted to have nonspecific T wave changes in the lateral ankles with highest BNP in the 1600s. Patient was diuresed with a net-3.5 L and weight down from 189 pounds to 171 pounds.  During this admission, he was found to be volume overloaded weighing 200 pounds with troponin peaking at 630, and EKG showing ST depressions at V4-V6 with T wave inversions in lead I and aVL concerning for NSTEMI, in the setting of a hemoglobin of 6.3. It is felt that his troponin rise was likely in the setting of symptomatic anemia with component of heart failure exacerbation. Cardiology was consulted who did not recommend any urgent catheterization. TTE this admission with no changes from his most recent one. Given soft blood pressures, his metoprolol was held and patient was diuresed with IV Lasix 40mg  twice daily with good urine output. His acute symptoms have improved and patient was ultimately discharged on Lasix 60mg  PO BID.   #AKI 2/2 cardiorenal syndrome #CKD stage IIIb Baseline creatinine appears to be  1.8-2. On admission his creatinine was 3.22, and given his volume status, it was felt that patient's creatinine is likely cardiorenal. Creatinine improved with diuresis and creatinine at the time of discharge was 2.2.   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 Admission labs notable for leukopenia WBC 3.9, lymphopenia of 400 ALC, macrocytic anemia MCV of 123.6hemoglobin 6.3, platelets 17. This is felt to be secondary to bone marrow suppression from his acute illness. Of note, patient was previously on Aranesp but recently switched to Luspatercept. Hematology was consulted who recommended transfusion goal with Hgb > 8 and platelets > 10 if not bleeding. Work-up was unremarkable for hemolysis or DIC. Patient received 1 unit of PRBC with Lasix 40 IV in the ED as well as 1 unit of platelets on 11/24 for platelets of 9. With treatment of his primary problem, patient's pancytopenia improved. Patient was discharged with WBC of 3.4, Hgb of 7.9, and platelets of 31. Patient will continue with 1 mg folic acid, 3818 mg vitamin B12 daily. Patient was given 1 unit of platelets given platelets less than <15K prior to discharge for hematology recommendation. Patient will require an urgent follow-up with hematology/oncology (primary treating oncologist in [place]) within 1 week of discharge.   #Fall complicated by right proximal nondisplaced fibular metadiaphysis Unclear etiology of the fall given it was unwitnessed, but ultimately thought to be secondary to orthostatic hypotension given patient was hypotensive on admission found to have heart failure exacerbation. No signs of arrhythmias during this hospitalization. X-ray notable for oblique fracture of the proximal fibular metaphysis. Orthopedics was consulted who did not recommend any surgical intervention given comorbidities. CT right ankle was obtained for concern of distal syndesmosis disruption presents unremarkable. Pain was managed with as needed oxycodone. PT/OT ordered for progressive ambulation with weightbearing as tolerated per orthopedics, and ultimately recommend SNF.  Dementia with sundowning Patient in afternoons found to have episodes of  confusion and attempting to get out of bed. He responded to redirection. He had no memory of these events in the AM.   Patient's Ordered Code Status: DNR / Limited SOTO   covid booster:  Patient Active Problem List   Diagnosis Date Noted  . Acute combined systolic and diastolic heart failure ([place])   . AKI (acute kidney injury) ([place]) 04/04/2020  . Acute on chronic diastolic CHF (congestive heart failure) ([place]) 04/04/2020  . Symptomatic anemia 04/04/2020  . Thrombocytopenia ([place]) 04/04/2020  . Moderate aortic stenosis 04/04/2020  . Acute exacerbation of CHF (congestive heart failure) ([place]) 09/25/2019  . Elevated troponin 09/25/2019  . Spondylosis without myelopathy or radiculopathy, lumbar region 08/12/2019  . Spinal stenosis of lumbar region with neurogenic claudication 08/12/2019  . Myofascial pain syndrome 08/12/2019  . Chest pain 03/26/2019  . Chronic myelomonocytic leukemia not having achieved remission ([place]) 12/04/2016  . Chronic pain syndrome 09/26/2015  . De Quervain's tenosynovitis, right 09/13/2015  . Anemia due to antineoplastic chemotherapy 08/15/2015  . DLBCL (diffuse large B cell lymphoma) ([place]) 08/15/2015  . Skin lesion of face 05/21/2015  . Peripheral neuropathy due to chemotherapy ([place]) 02/09/2015  . Benign prostatic hyperplasia with urinary obstruction 02/09/2015  . Bilateral hearing loss 02/09/2015  . Low back pain 02/09/2015  . CKD (chronic kidney disease), stage III ([place]) 02/09/2015  . Essential (primary) hypertension 02/09/2015  . Gastro-esophageal reflux disease without esophagitis 02/09/2015  . Anxiety, generalized 02/09/2015  . Cannot sleep 02/09/2015  . Combined fat and  carbohydrate induced hyperlipemia 02/09/2015    Past Medical History:  Diagnosis Date  . Acute exacerbation of CHF (congestive heart failure) ([place]) 09/25/2019  . Anemia   . Anemia due to antineoplastic chemotherapy 08/15/2015  . Anxiety   . Arthritis   . Cataract   . Chronic kidney  disease (CKD), stage III (moderate) ([facility]) 02/09/2015   Controlled  . CMML (chronic myelomonocytic leukemia) ([place])    gets Aranesp about monthly, otherwise surveillance with Dr. Marin Olp  . Combined fat and carbohydrate induced hyperlipemia 02/09/2015   Controlled  . Depression   . DLBCL (diffuse large B cell lymphoma) ([place]) 08/15/2015  . Essential (primary) hypertension 02/09/2015  . Gastro-esophageal reflux disease without esophagitis 02/09/2015   Controlled  . Renal insufficiency   . Tinnitus     Past Surgical History:  Procedure Laterality Date  . CATARACT EXTRACTION, BILATERAL    . COLONOSCOPY  May 2011  . CYSTOSCOPY    . TOTAL HIP ARTHROPLASTY  2003   Right   . TOTAL HIP ARTHROPLASTY  April, 2011   Left  . TRIGGER FINGER RELEASE  Nov 2013  . TURBINECTOMY  2006  . UPPER GI ENDOSCOPY  Nov 2015    Social History   Tobacco Use  . Smoking status: Former Research scientist (life sciences)  . Smokeless tobacco: Never Used  . Tobacco comment: Quit >50 years ago  Vaping Use  . Vaping Use: Never used  Substance Use Topics  . Alcohol use: Yes    Alcohol/week: 1.0 - 4.0 standard drink    Types: 1 - 4 Shots of liquor per week    Comment: 4 glasses per week  . Drug use: No    Family History  Problem Relation Age of Onset  . Anuerysm Mother 68       Deceased  . Colon cancer Father 92       Deceased  . Colon cancer Brother   . Prostate cancer Brother        Deceased  . Alzheimer's disease Brother   . Heart disease Brother   . Alzheimer's disease Paternal Grandmother   . Early death Maternal Grandmother        Unknown  . Obesity Son   . Obesity Son        Gastric Bypass  . Healthy Son   . Healthy Daughter   . Diabetes Neg Hx     Allergies  Allergen Reactions  . Nitroglycerin Other (See Comments)    "I ended up in a fetal position from the pain"  . Nsaids Other (See Comments) and Tinitus    Bruising   . Statins Nausea And Vomiting and Other (See Comments)    Kidney issues and muscle  pain   . Tolmetin Other (See Comments)    Bruising  . Ciprofloxacin Other (See Comments)    Achilles tendon issues  . Ropinirole Anxiety and Other (See Comments)    Mood swings, also  . Requip [Ropinirole Hcl] Anxiety    Medication list has been reviewed and updated.  Current Outpatient Medications on File Prior to Visit  Medication Sig Dispense Refill  . acetaminophen (TYLENOL) 500 MG tablet Take 1 tablet (500 mg total) by mouth every 8 (eight) hours. 90 tablet 1  . allopurinol (ZYLOPRIM) 100 MG tablet TAKE 2 TABLETS(200 MG) BY MOUTH DAILY (Patient taking differently: Take 200 mg by mouth daily with breakfast. ) 180 tablet 3  . ALPRAZolam (XANAX) 0.5 MG tablet TAKE 1 TABLET(0.5 MG) BY MOUTH THREE TIMES DAILY  AS NEEDED FOR ANXIETY (Patient taking differently: Take 0.5 mg by mouth 3 (three) times daily as needed for anxiety. ) 90 tablet 3  . Artificial Tear Ointment (DRY EYES OP) Apply 1 drop to eye daily as needed (for dry eyes).    Marland Kitchen b complex vitamins tablet Take 1 tablet by mouth daily.    . Calcium 500 MG tablet Take 500 mg by mouth daily.     . Cholecalciferol (VITAMIN D3) 2000 UNITS capsule Take 2,000 Units by mouth daily.    . colchicine 0.6 MG tablet Take 0.5 tablets (0.3 mg total) by mouth 2 (two) times daily. 60 tablet 0  . Cyanocobalamin (VITAMIN B-12 PO) Take 1 tablet by mouth daily after breakfast.    . fluorouracil (EFUDEX) 5 % cream Apply 1 application topically.     . folic acid (FOLVITE) 1 MG tablet TAKE 2 TABLETS(2 MG) BY MOUTH DAILY (Patient taking differently: Take 1 mg by mouth daily. ) 60 tablet 4  . furosemide (LASIX) 20 MG tablet Take 3 tablets (60 mg total) by mouth daily. 90 tablet 1  . gabapentin (NEURONTIN) 300 MG capsule TAKE 1 CAPSULE BY MOUTH THREE TIMES DAILY PLUS 2 CAPSULES AS NEEDED (Patient taking differently: Take 300 mg by mouth 3 (three) times daily. ) 150 capsule 3  . Melatonin 10 MG TABS Take 20 mg by mouth at bedtime.     . metoprolol succinate  (TOPROL-XL) 25 MG 24 hr tablet Take 1 tablet (25 mg total) by mouth daily. 90 tablet 1  . Polyethyl Glyc-Propyl Glyc PF (SYSTANE ULTRA PF) 0.4-0.3 % SOLN Place 1-2 drops into both eyes 3 (three) times daily as needed (for dryness).     . polyethylene glycol (MIRALAX / GLYCOLAX) packet Take 17 g by mouth daily as needed for mild constipation.     . Potassium Chloride ER 20 MEQ TBCR Take 20 mEq by mouth daily. 30 tablet 1  . Pyridoxine HCl (VITAMIN B-6 PO) Take 1 tablet by mouth daily after breakfast.    . vitamin C (ASCORBIC ACID) 500 MG tablet Take 1,000 mg by mouth daily.     Marland Kitchen zinc gluconate 50 MG tablet Take 50 mg by mouth daily.     No current facility-administered medications on file prior to visit.    Review of Systems:  As per HPI- otherwise negative.   Physical Examination: There were no vitals filed for this visit. There were no vitals filed for this visit. There is no height or weight on file to calculate BMI. Ideal Body Weight:    GEN: no acute distress. HEENT: Atraumatic, Normocephalic.  Ears and Nose: No external deformity. CV: RRR, No M/G/R. No JVD. No thrill. No extra heart sounds. PULM: CTA B, no wheezes, crackles, rhonchi. No retractions. No resp. distress. No accessory muscle use. ABD: S, NT, ND, +BS. No rebound. No HSM. EXTR: No c/c/e PSYCH: Normally interactive. Conversant.    Assessment and Plan: *** This visit occurred during the SARS-CoV-2 public health emergency.  Safety protocols were in place, including screening questions prior to the visit, additional usage of staff PPE, and extensive cleaning of exam room while observing appropriate contact time as indicated for disinfecting solutions.    Signed Lamar Blinks, MD

## 2020-05-22 DIAGNOSIS — Z888 Allergy status to other drugs, medicaments and biological substances status: Secondary | ICD-10-CM | POA: Diagnosis not present

## 2020-05-22 DIAGNOSIS — W1839XA Other fall on same level, initial encounter: Secondary | ICD-10-CM | POA: Diagnosis not present

## 2020-05-22 DIAGNOSIS — F0390 Unspecified dementia without behavioral disturbance: Secondary | ICD-10-CM | POA: Diagnosis not present

## 2020-05-22 DIAGNOSIS — R102 Pelvic and perineal pain: Secondary | ICD-10-CM | POA: Diagnosis not present

## 2020-05-22 DIAGNOSIS — N189 Chronic kidney disease, unspecified: Secondary | ICD-10-CM | POA: Diagnosis not present

## 2020-05-22 DIAGNOSIS — J810 Acute pulmonary edema: Secondary | ICD-10-CM | POA: Diagnosis not present

## 2020-05-22 DIAGNOSIS — N179 Acute kidney failure, unspecified: Secondary | ICD-10-CM | POA: Diagnosis not present

## 2020-05-22 DIAGNOSIS — C9312 Chronic myelomonocytic leukemia, in relapse: Secondary | ICD-10-CM | POA: Diagnosis not present

## 2020-05-22 DIAGNOSIS — F419 Anxiety disorder, unspecified: Secondary | ICD-10-CM | POA: Diagnosis not present

## 2020-05-22 DIAGNOSIS — N178 Other acute kidney failure: Secondary | ICD-10-CM | POA: Diagnosis not present

## 2020-05-22 DIAGNOSIS — Z881 Allergy status to other antibiotic agents status: Secondary | ICD-10-CM | POA: Diagnosis not present

## 2020-05-22 DIAGNOSIS — M79671 Pain in right foot: Secondary | ICD-10-CM | POA: Diagnosis not present

## 2020-05-22 DIAGNOSIS — D696 Thrombocytopenia, unspecified: Secondary | ICD-10-CM | POA: Diagnosis not present

## 2020-05-22 DIAGNOSIS — I21A1 Myocardial infarction type 2: Secondary | ICD-10-CM | POA: Diagnosis not present

## 2020-05-22 DIAGNOSIS — J9611 Chronic respiratory failure with hypoxia: Secondary | ICD-10-CM | POA: Diagnosis not present

## 2020-05-22 DIAGNOSIS — I5023 Acute on chronic systolic (congestive) heart failure: Secondary | ICD-10-CM | POA: Diagnosis not present

## 2020-05-22 DIAGNOSIS — Z66 Do not resuscitate: Secondary | ICD-10-CM | POA: Diagnosis not present

## 2020-05-22 DIAGNOSIS — Z515 Encounter for palliative care: Secondary | ICD-10-CM | POA: Diagnosis not present

## 2020-05-22 DIAGNOSIS — Z8572 Personal history of non-Hodgkin lymphomas: Secondary | ICD-10-CM | POA: Diagnosis not present

## 2020-05-22 DIAGNOSIS — I272 Pulmonary hypertension, unspecified: Secondary | ICD-10-CM | POA: Diagnosis not present

## 2020-05-22 DIAGNOSIS — I509 Heart failure, unspecified: Secondary | ICD-10-CM | POA: Diagnosis not present

## 2020-05-22 DIAGNOSIS — N1832 Chronic kidney disease, stage 3b: Secondary | ICD-10-CM | POA: Diagnosis not present

## 2020-05-22 DIAGNOSIS — I130 Hypertensive heart and chronic kidney disease with heart failure and stage 1 through stage 4 chronic kidney disease, or unspecified chronic kidney disease: Secondary | ICD-10-CM | POA: Diagnosis not present

## 2020-05-22 DIAGNOSIS — F17290 Nicotine dependence, other tobacco product, uncomplicated: Secondary | ICD-10-CM | POA: Diagnosis not present

## 2020-05-22 DIAGNOSIS — J811 Chronic pulmonary edema: Secondary | ICD-10-CM | POA: Diagnosis not present

## 2020-05-22 DIAGNOSIS — E8770 Fluid overload, unspecified: Secondary | ICD-10-CM | POA: Diagnosis not present

## 2020-05-22 DIAGNOSIS — Z043 Encounter for examination and observation following other accident: Secondary | ICD-10-CM | POA: Diagnosis not present

## 2020-05-22 DIAGNOSIS — R296 Repeated falls: Secondary | ICD-10-CM | POA: Diagnosis not present

## 2020-05-23 ENCOUNTER — Encounter: Payer: Self-pay | Admitting: Hematology & Oncology

## 2020-05-23 DIAGNOSIS — I214 Non-ST elevation (NSTEMI) myocardial infarction: Secondary | ICD-10-CM | POA: Diagnosis not present

## 2020-05-23 DIAGNOSIS — I5023 Acute on chronic systolic (congestive) heart failure: Secondary | ICD-10-CM | POA: Diagnosis not present

## 2020-05-23 DIAGNOSIS — R296 Repeated falls: Secondary | ICD-10-CM | POA: Diagnosis not present

## 2020-05-23 DIAGNOSIS — Z8781 Personal history of (healed) traumatic fracture: Secondary | ICD-10-CM | POA: Diagnosis not present

## 2020-05-23 DIAGNOSIS — I440 Atrioventricular block, first degree: Secondary | ICD-10-CM | POA: Diagnosis not present

## 2020-05-23 DIAGNOSIS — J9611 Chronic respiratory failure with hypoxia: Secondary | ICD-10-CM | POA: Diagnosis not present

## 2020-05-24 ENCOUNTER — Encounter: Payer: Self-pay | Admitting: Family Medicine

## 2020-05-25 ENCOUNTER — Ambulatory Visit: Payer: Medicare Other | Admitting: Family Medicine

## 2020-05-26 ENCOUNTER — Inpatient Hospital Stay: Payer: Medicare Other

## 2020-05-26 ENCOUNTER — Inpatient Hospital Stay: Payer: Medicare Other | Admitting: Family

## 2020-06-21 ENCOUNTER — Ambulatory Visit: Payer: Medicare Other | Admitting: Cardiology

## 2020-06-25 DEATH — deceased

## 2020-08-04 ENCOUNTER — Other Ambulatory Visit: Payer: Self-pay | Admitting: Family Medicine

## 2021-06-13 IMAGING — DX DG CHEST 1V PORT
1 series · 1 of 1 positions shown · non-contrast
Comparison: 12/04/2019

CLINICAL DATA: Shortness of breath

EXAM:
PORTABLE CHEST 1 VIEW

[chest]
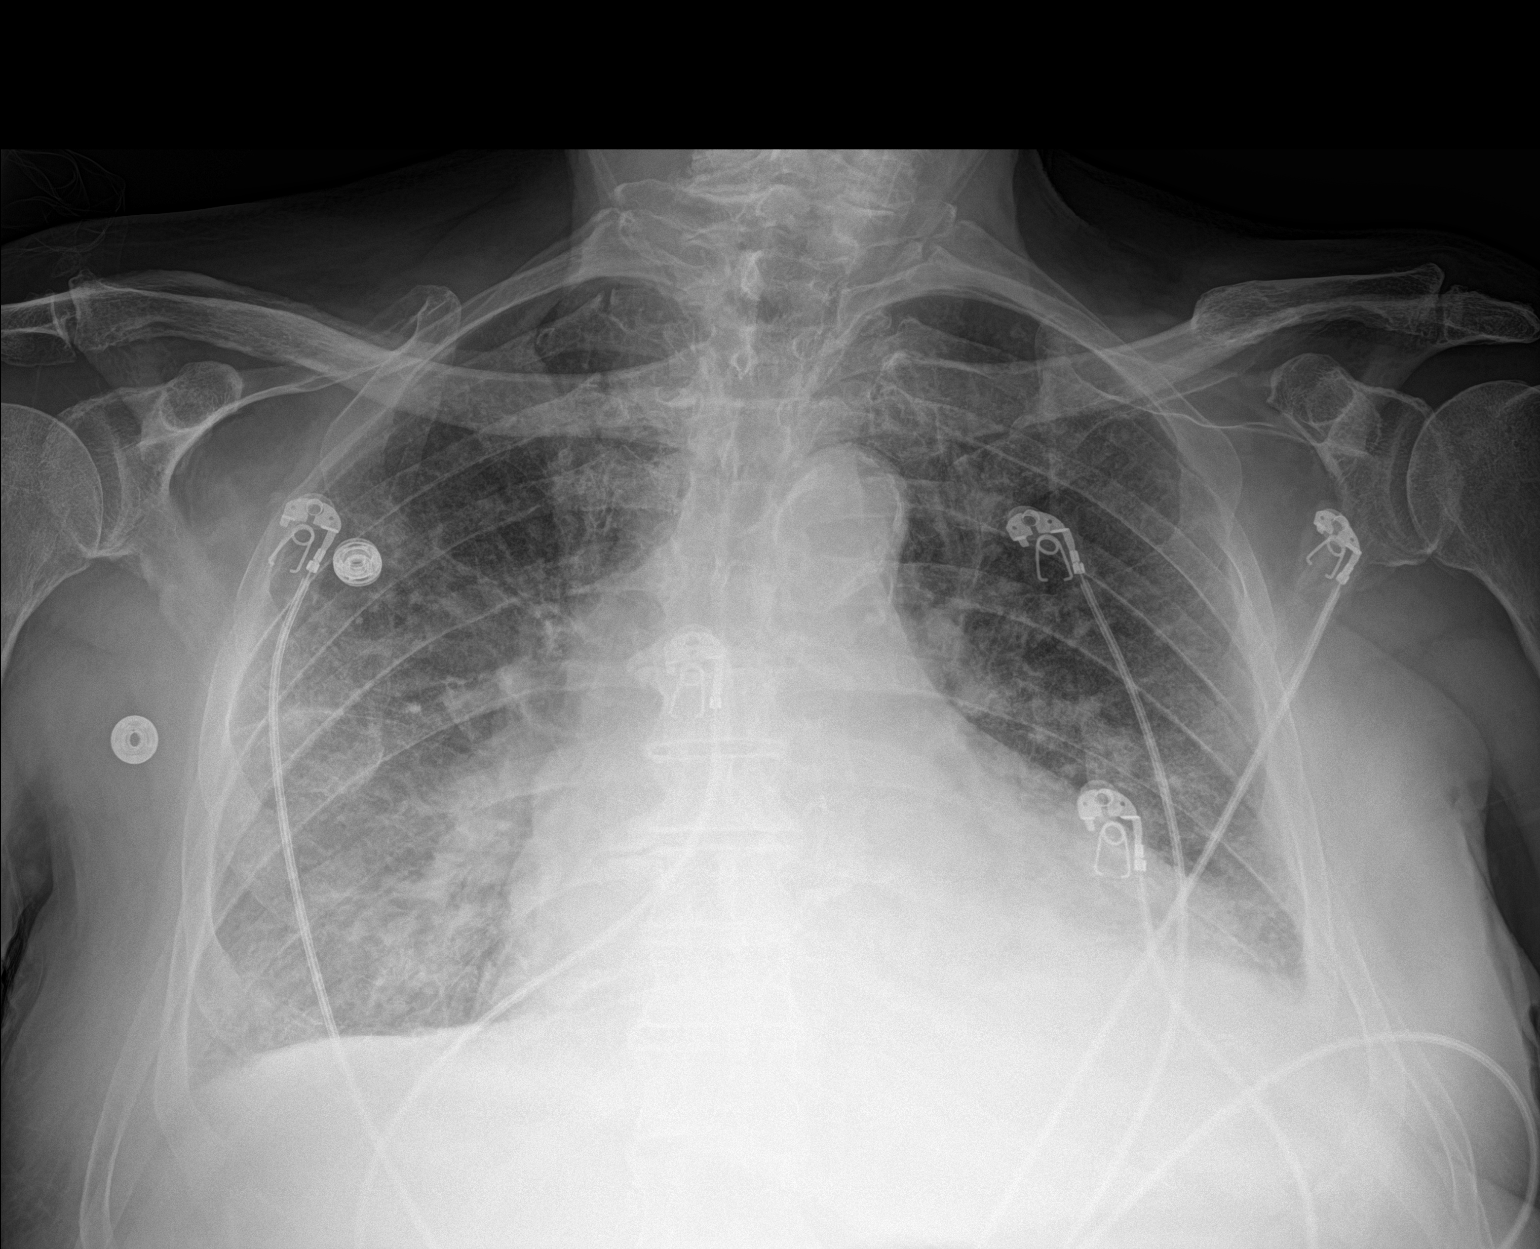

[1 of 1 positions shown; findings below may reference images not displayed]

FINDINGS: Cardiac shadow is enlarged but stable. Aortic calcifications are
again seen. Mild vascular congestion and interstitial edema is seen
particularly in the right lung. No sizable effusion is noted. No
bony abnormality is seen. Old left clavicular fracture with healing
is noted.
IMPRESSION: Increased vascular congestion with evidence of mild interstitial
edema.
